# Patient Record
Sex: Female | Born: 1940
Health system: Southern US, Community
[De-identification: ages and names within clinical notes are randomized; demographics above are authoritative.]

## PROBLEM LIST (undated history)

## (undated) DIAGNOSIS — C50919 Malignant neoplasm of unspecified site of unspecified female breast: Secondary | ICD-10-CM

## (undated) DIAGNOSIS — I340 Nonrheumatic mitral (valve) insufficiency: Secondary | ICD-10-CM

## (undated) DIAGNOSIS — E785 Hyperlipidemia, unspecified: Secondary | ICD-10-CM

## (undated) DIAGNOSIS — I493 Ventricular premature depolarization: Secondary | ICD-10-CM

## (undated) DIAGNOSIS — T451X5A Adverse effect of antineoplastic and immunosuppressive drugs, initial encounter: Secondary | ICD-10-CM

## (undated) DIAGNOSIS — Z923 Personal history of irradiation: Secondary | ICD-10-CM

## (undated) DIAGNOSIS — Z9221 Personal history of antineoplastic chemotherapy: Secondary | ICD-10-CM

## (undated) DIAGNOSIS — I4891 Unspecified atrial fibrillation: Secondary | ICD-10-CM

## (undated) DIAGNOSIS — R112 Nausea with vomiting, unspecified: Secondary | ICD-10-CM

## (undated) DIAGNOSIS — E039 Hypothyroidism, unspecified: Secondary | ICD-10-CM

## (undated) DIAGNOSIS — C569 Malignant neoplasm of unspecified ovary: Secondary | ICD-10-CM

## (undated) DIAGNOSIS — K219 Gastro-esophageal reflux disease without esophagitis: Secondary | ICD-10-CM

## (undated) HISTORY — DX: Ventricular premature depolarization: I49.3

## (undated) HISTORY — DX: Hypothyroidism, unspecified: E03.9

## (undated) HISTORY — DX: Gastro-esophageal reflux disease without esophagitis: K21.9

## (undated) HISTORY — PX: ABDOMINAL HYSTERECTOMY: SHX81

## (undated) HISTORY — DX: Adverse effect of antineoplastic and immunosuppressive drugs, initial encounter: R11.2

## (undated) HISTORY — DX: Adverse effect of antineoplastic and immunosuppressive drugs, initial encounter: T45.1X5A

## (undated) HISTORY — PX: BREAST SURGERY: SHX581

## (undated) HISTORY — PX: CHOLECYSTECTOMY: SHX55

## (undated) HISTORY — DX: Hyperlipidemia, unspecified: E78.5

## (undated) HISTORY — DX: Malignant neoplasm of unspecified ovary: C56.9

## (undated) HISTORY — DX: Nonrheumatic mitral (valve) insufficiency: I34.0

## (undated) HISTORY — DX: Malignant neoplasm of unspecified site of unspecified female breast: C50.919

## (undated) HISTORY — DX: Unspecified atrial fibrillation: I48.91

---

## 1990-08-20 DIAGNOSIS — C50919 Malignant neoplasm of unspecified site of unspecified female breast: Secondary | ICD-10-CM

## 1990-08-20 HISTORY — DX: Malignant neoplasm of unspecified site of unspecified female breast: C50.919

## 1996-08-20 DIAGNOSIS — C50919 Malignant neoplasm of unspecified site of unspecified female breast: Secondary | ICD-10-CM

## 1996-08-20 DIAGNOSIS — Z923 Personal history of irradiation: Secondary | ICD-10-CM

## 1996-08-20 HISTORY — DX: Personal history of irradiation: Z92.3

## 1996-08-20 HISTORY — PX: BREAST LUMPECTOMY: SHX2

## 1996-08-20 HISTORY — DX: Malignant neoplasm of unspecified site of unspecified female breast: C50.919

## 1999-02-09 ENCOUNTER — Encounter: Payer: Self-pay | Admitting: Neurosurgery

## 1999-02-09 ENCOUNTER — Ambulatory Visit (HOSPITAL_COMMUNITY): Admission: RE | Admit: 1999-02-09 | Discharge: 1999-02-09 | Payer: Self-pay | Admitting: Neurosurgery

## 2001-05-15 ENCOUNTER — Other Ambulatory Visit: Admission: RE | Admit: 2001-05-15 | Discharge: 2001-05-15 | Payer: Self-pay | Admitting: Family Medicine

## 2004-08-20 HISTORY — PX: COLONOSCOPY: SHX174

## 2004-08-23 ENCOUNTER — Ambulatory Visit: Payer: Self-pay | Admitting: Unknown Physician Specialty

## 2004-09-25 ENCOUNTER — Ambulatory Visit: Payer: Self-pay | Admitting: Unknown Physician Specialty

## 2004-09-25 HISTORY — PX: UPPER GI ENDOSCOPY: SHX6162

## 2004-09-25 LAB — HM COLONOSCOPY

## 2004-10-30 ENCOUNTER — Ambulatory Visit: Payer: Self-pay | Admitting: Surgery

## 2005-02-14 ENCOUNTER — Ambulatory Visit: Payer: Self-pay | Admitting: Family Medicine

## 2006-02-27 ENCOUNTER — Ambulatory Visit: Payer: Self-pay | Admitting: Family Medicine

## 2007-03-28 ENCOUNTER — Ambulatory Visit: Payer: Self-pay | Admitting: Family Medicine

## 2008-03-29 ENCOUNTER — Ambulatory Visit: Payer: Self-pay | Admitting: Family Medicine

## 2009-03-31 ENCOUNTER — Ambulatory Visit: Payer: Self-pay | Admitting: Family Medicine

## 2009-10-04 ENCOUNTER — Emergency Department: Payer: Self-pay | Admitting: Emergency Medicine

## 2009-12-14 LAB — HM DEXA SCAN

## 2010-04-12 ENCOUNTER — Ambulatory Visit: Payer: Self-pay | Admitting: Family Medicine

## 2011-04-26 ENCOUNTER — Ambulatory Visit: Payer: Self-pay | Admitting: Family Medicine

## 2012-05-01 ENCOUNTER — Ambulatory Visit: Payer: Self-pay | Admitting: Family Medicine

## 2013-05-26 ENCOUNTER — Ambulatory Visit: Payer: Self-pay | Admitting: Family Medicine

## 2014-03-16 LAB — LIPID PANEL
Cholesterol: 266 mg/dL — AB (ref 0–200)
HDL: 62 mg/dL (ref 35–70)
LDL CALC: 183 mg/dL
LDL/HDL RATIO: 3
Triglycerides: 103 mg/dL (ref 40–160)

## 2014-03-16 LAB — TSH: TSH: 3.97 u[IU]/mL (ref 0.41–5.90)

## 2014-03-16 LAB — BASIC METABOLIC PANEL
BUN: 14 mg/dL (ref 4–21)
Creatinine: 1.1 mg/dL (ref 0.5–1.1)
GLUCOSE: 84 mg/dL
Sodium: 143 mmol/L (ref 137–147)

## 2014-06-02 ENCOUNTER — Ambulatory Visit: Payer: Self-pay | Admitting: Family Medicine

## 2014-09-02 DIAGNOSIS — I480 Paroxysmal atrial fibrillation: Secondary | ICD-10-CM | POA: Insufficient documentation

## 2014-09-02 DIAGNOSIS — I340 Nonrheumatic mitral (valve) insufficiency: Secondary | ICD-10-CM | POA: Insufficient documentation

## 2014-12-06 ENCOUNTER — Ambulatory Visit
Admit: 2014-12-06 | Disposition: A | Discharge status: Home / Self Care | Payer: Self-pay | Admitting: Unknown Physician Specialty

## 2014-12-08 ENCOUNTER — Ambulatory Visit
Admit: 2014-12-08 | Disposition: A | Discharge status: Home / Self Care | Payer: Self-pay | Attending: Oncology | Admitting: Oncology

## 2014-12-08 LAB — CBC CANCER CENTER
BASOS ABS: 0.1 x10 3/mm (ref 0.0–0.1)
Basophil %: 1.2 %
Eosinophil #: 0.1 x10 3/mm (ref 0.0–0.7)
Eosinophil %: 0.7 %
HCT: 44.4 % (ref 35.0–47.0)
HGB: 14.7 g/dL (ref 12.0–16.0)
LYMPHS ABS: 2.9 x10 3/mm (ref 1.0–3.6)
LYMPHS PCT: 35 %
MCH: 30.5 pg (ref 26.0–34.0)
MCHC: 33.1 g/dL (ref 32.0–36.0)
MCV: 92 fL (ref 80–100)
Monocyte #: 1.4 x10 3/mm — ABNORMAL HIGH (ref 0.2–0.9)
Monocyte %: 17.3 %
NEUTROS PCT: 45.8 %
Neutrophil #: 3.8 x10 3/mm (ref 1.4–6.5)
Platelet: 276 x10 3/mm (ref 150–440)
RBC: 4.82 10*6/uL (ref 3.80–5.20)
RDW: 12.9 % (ref 11.5–14.5)
WBC: 8.2 x10 3/mm (ref 3.6–11.0)

## 2014-12-08 LAB — COMPREHENSIVE METABOLIC PANEL
ALT: 16 U/L
Albumin: 3.8 g/dL
Alkaline Phosphatase: 123 U/L
Anion Gap: 8 (ref 7–16)
BUN: 14 mg/dL
Bilirubin,Total: 0.4 mg/dL
CHLORIDE: 104 mmol/L
CO2: 27 mmol/L
Calcium, Total: 9.3 mg/dL
Creatinine: 0.96 mg/dL
EGFR (African American): >60
GFR CALC NON AF AMER: 60 — AB
Glucose: 98 mg/dL
Potassium: 4.3 mmol/L
SGOT(AST): 20 U/L
Sodium: 139 mmol/L
TOTAL PROTEIN: 7.6 g/dL

## 2014-12-09 ENCOUNTER — Encounter: Payer: Self-pay | Admitting: *Deleted

## 2014-12-09 LAB — CA 125: CA 125: 2354 U/mL — AB (ref 0.0–34.0)

## 2014-12-14 ENCOUNTER — Ambulatory Visit
Admit: 2014-12-14 | Disposition: A | Discharge status: Home / Self Care | Payer: Self-pay | Attending: Obstetrics and Gynecology | Admitting: Obstetrics and Gynecology

## 2014-12-14 ENCOUNTER — Ambulatory Visit: Payer: Self-pay

## 2014-12-14 DIAGNOSIS — C569 Malignant neoplasm of unspecified ovary: Secondary | ICD-10-CM | POA: Diagnosis not present

## 2014-12-14 LAB — CBC WITH DIFFERENTIAL/PLATELET
Basophil #: 0.1 10*3/uL (ref 0.0–0.1)
Basophil %: 0.6 %
EOS PCT: 0.8 %
Eosinophil #: 0.1 10*3/uL (ref 0.0–0.7)
HCT: 45.1 % (ref 35.0–47.0)
HGB: 14.7 g/dL (ref 12.0–16.0)
LYMPHS ABS: 3.2 10*3/uL (ref 1.0–3.6)
Lymphocyte %: 27.8 %
MCH: 30.2 pg (ref 26.0–34.0)
MCHC: 32.6 g/dL (ref 32.0–36.0)
MCV: 93 fL (ref 80–100)
MONO ABS: 1.4 x10 3/mm — AB (ref 0.2–0.9)
Monocyte %: 12.2 %
NEUTROS ABS: 6.7 10*3/uL — AB (ref 1.4–6.5)
Neutrophil %: 58.6 %
PLATELETS: 284 10*3/uL (ref 150–440)
RBC: 4.86 10*6/uL (ref 3.80–5.20)
RDW: 13 % (ref 11.5–14.5)
WBC: 11.4 10*3/uL — ABNORMAL HIGH (ref 3.6–11.0)

## 2014-12-14 LAB — URINALYSIS, COMPLETE
Bilirubin,UR: NEGATIVE
Blood: NEGATIVE
Glucose,UR: NEGATIVE mg/dL (ref 0–75)
Ketone: NEGATIVE
NITRITE: NEGATIVE
Ph: 6 (ref 4.5–8.0)
Protein: NEGATIVE
Specific Gravity: 1.005 (ref 1.003–1.030)

## 2014-12-14 LAB — COMPREHENSIVE METABOLIC PANEL
ALK PHOS: 138 U/L — AB
ALT: 11 U/L — AB
Albumin: 3.8 g/dL
Anion Gap: 8 (ref 7–16)
BUN: 12 mg/dL
Bilirubin,Total: 0.5 mg/dL
CALCIUM: 9.2 mg/dL
Chloride: 106 mmol/L
Co2: 26 mmol/L
Creatinine: 0.97 mg/dL
EGFR (African American): >60
GFR CALC NON AF AMER: 59 — AB
Glucose: 87 mg/dL
POTASSIUM: 4.4 mmol/L
SGOT(AST): 20 U/L
Sodium: 140 mmol/L
Total Protein: 7.3 g/dL

## 2014-12-14 LAB — PROTIME-INR
INR: 1
PROTHROMBIN TIME: 13.8 s

## 2014-12-14 LAB — APTT: ACTIVATED PTT: 29.2 s (ref 23.6–35.9)

## 2014-12-15 ENCOUNTER — Telehealth: Payer: Self-pay | Admitting: General Surgery

## 2014-12-15 ENCOUNTER — Ambulatory Visit (INDEPENDENT_AMBULATORY_CARE_PROVIDER_SITE_OTHER): Payer: PPO | Admitting: General Surgery

## 2014-12-15 ENCOUNTER — Encounter: Payer: Self-pay | Admitting: General Surgery

## 2014-12-15 VITALS — BP 140/72 | HR 70 | Resp 14 | Ht 63.0 in | Wt 182.0 lb

## 2014-12-15 DIAGNOSIS — C569 Malignant neoplasm of unspecified ovary: Secondary | ICD-10-CM

## 2014-12-15 MED ORDER — NEOMYCIN SULFATE 500 MG PO TABS: ORAL_TABLET | ORAL | Status: DC

## 2014-12-15 MED ORDER — POLYETHYLENE GLYCOL 3350 17 GM/SCOOP PO POWD: ORAL | Status: DC

## 2014-12-15 MED ORDER — METRONIDAZOLE 500 MG PO TABS
ORAL_TABLET | ORAL | Status: AC
Start: 2014-12-15 — End: 2014-12-15

## 2014-12-15 NOTE — Patient Instructions (Signed)
Patient to have the following labs drawn at [facility] today: Protein C.     Patient's surgery has been scheduled for 12-22-14 with Dr. Theora Gianotti at the [facility]. This patient has been asked to complete a bowel prep with antibiotics day before surgery.

## 2014-12-15 NOTE — Progress Notes (Signed)
Patient ID: Jamie Conner, female   DOB: Jan 24, 1941, 74 y.o.   MRN: [id number]  Chief Complaint  Patient presents with  . Other    HPI Jamie Conner is a 74 y.o. female here today to discuss exploratory laparoscopic surgery scheduled on 12/22/14. She states she has been having abdominal pain for three weeks in her lower abdomen . The pain is a dull / sharp pain and last for about 1 to 2 hours.  In the preceding months she had been aware of abdominal bloating but no significant weight loss. She was evaluated by Dawson Bills, NP at [facility] for her pain, and subsequent CT suggested ovarian cancer. She has recently seen GYN oncology, Mellody Drown, M.D. and is felt to be a candidate for exploratory celiotomy, debulking with the possibility of colon resection if an direct invasion is appreciated. She is seen now to review these plans from the general surgery side.  Speaking with her medical oncologist, he is desirous of a central venous access port being placed at the time of surgery. HPI  Past Medical History  Diagnosis Date  . Atrial fibrillation   . Hyperlipidemia   . PVC (premature ventricular contraction)   . Mitral insufficiency   . GERD (gastroesophageal reflux disease)   . Cancer of breast     Left  . Hypothyroidism     Past Surgical History  Procedure Laterality Date  . Cesarean section      x2  . Abdominal hysterectomy    . Cholecystectomy    . Colonoscopy  2006  . Breast surgery      Left    No family history on file.  Social History History  Substance Use Topics  . Smoking status: Never Smoker   . Smokeless tobacco: Not on file  . Alcohol Use: No    Allergies  Allergen Reactions  . Carafate [Sucralfate] Rash  . Sulfa Antibiotics Rash    Current Outpatient Prescriptions  Medication Sig Dispense Refill  . acetaminophen (TYLENOL) 500 MG chewable tablet Chew 500 mg by mouth every 6 (six) hours as needed for pain.    Marland Kitchen aspirin EC 81 MG tablet Take 81 mg by  mouth daily.    . diazepam (VALIUM) 5 MG tablet Take 5 mg by mouth at bedtime as needed for anxiety.    Marland Kitchen diltiazem (CARDIZEM CD) 120 MG 24 hr capsule Take 120 mg by mouth daily.    Marland Kitchen docusate sodium (COLACE) 50 MG capsule Take 50 mg by mouth 2 (two) times daily.    Marland Kitchen levothyroxine (SYNTHROID, LEVOTHROID) 50 MCG tablet Take 50 mcg by mouth daily before breakfast.    . pantoprazole (PROTONIX) 40 MG tablet Take 40 mg by mouth daily.    . propafenone (RYTHMOL) 225 MG tablet Take 225 mg by mouth 2 (two) times daily.    Marland Kitchen Propylene Glycol (SYSTANE BALANCE OP) Apply 1 drop to eye 2 (two) times daily as needed.    . neomycin (MYCIFRADIN) 500 MG tablet Take two (2) tablets at 6 pm and two (2) tablets at 11 pm the evening prior to surgery. 4 tablet 0  . polyethylene glycol powder (GLYCOLAX/MIRALAX) powder 255 grams one bottle for bowel prep 255 g 0   No current facility-administered medications for this visit.    Review of Systems Review of Systems  Constitutional: Negative.   Respiratory: Negative.   Cardiovascular: Negative.   Gastrointestinal: Positive for nausea, abdominal pain and constipation. Negative for vomiting, diarrhea, blood in  stool, abdominal distention, anal bleeding and rectal pain.    Blood pressure 140/72, pulse 70, resp. rate 14, height 5\' 3"  (1.6 m), weight 182 lb (82.555 kg).  Physical Exam Physical Exam  Constitutional: She is oriented to person, place, and time. She appears well-developed and well-nourished.  Eyes: Conjunctivae are normal. No scleral icterus.  Neck: Neck supple.  Cardiovascular: Normal rate and normal heart sounds.  An irregular rhythm present.  Pulmonary/Chest: Effort normal and breath sounds normal.  Abdominal: Soft. Normal appearance and bowel sounds are normal. There is no tenderness.    Fullness in the right lower quadrant  Lymphadenopathy:    She has no cervical adenopathy.  Neurological: She is alert and oriented to person, place, and time.   Skin: Skin is warm.    Data Reviewed GYN oncology notes of 12/08/2014 reviewed. Special notation is made of request for extended Lovenox therapy for 28 days post procedure.  12/08/2014 CA-125: Markedly elevated at 2354.  CBC and comprehensive metabolic panel dated 59/16/3846 showed a hemoglobin of 14.7 with an MCV of 93, white blood cell count 11,400 with normal differential. Normal PT, PTT. BUN 12, creatinine 0.97. Estimated GFR 59. Normal electrolytes. Mild elevation of the alkaline phosphatase at 138, SGPT low at 11, SG OT normal at 20. Albumin 3.8.  GYN oncology consent form reviewed.  CT of the abdomen and pelvis dated 12/06/2014 was reviewed. Or centimeter right hepatic mass, previous hemangioma in 2006. No retroperitoneal adenopathy. Complex cystic mass involving the right ovary measuring up to 13 cm in diameter. Multiple peritoneal implants. Peritoneal implant under the right abdominal wall. Evidence of tumor deposit just near the vaginal cuff. Ascites overlying the right lobe of the liver.  Assessment    Advanced ovarian cancer.    Plan    The indication for possible colon resection was reviewed. Possibility of temporary/permanent colostomy discussed.  The patient reported that 2 siblings have protein C deficiency, her sister is had multiple blood clots. She has not had any difficulty with clotting during multiple previous surgeries including C-section, cholecystectomy and left breast cancer surgery. We'll go ahead and check her protein C level prior to surgery.  The indication for PowerPort placement was reviewed with the patient by phone the evening of her office visit. Risks including injury to adjacent organs was discussed. Need for port for adjuvant chemotherapy was emphasized.  Patient to have the following labs drawn at [facility] today: Protein C.     Patient's surgery has been scheduled for 12-22-14 with Dr. Theora Gianotti at the [facility]. This patient has  been asked to complete a bowel prep with antibiotics day before surgery.    PCP:  Cranford Mon, Donnal Moat, Forest Gleason 12/16/2014, 12:04 PM

## 2014-12-15 NOTE — Telephone Encounter (Signed)
After the patient left I reviewed the plan of care with Blain Pais, M.D. for medical oncology. He requested a PowerPort be place of the time surgery.  The patient was contacted by phone and the procedure reviewed. The fact that most people can tolerate her chemotherapy without central venous access was emphasized. The small but real risk to adjacent organs was discussed.  This time we'll plan to place a PowerPort after her exploratory surgery was completed.

## 2014-12-16 ENCOUNTER — Other Ambulatory Visit: Payer: Self-pay | Admitting: General Surgery

## 2014-12-16 ENCOUNTER — Telehealth: Payer: Self-pay | Admitting: *Deleted

## 2014-12-16 ENCOUNTER — Encounter: Payer: Self-pay | Admitting: General Surgery

## 2014-12-16 DIAGNOSIS — C569 Malignant neoplasm of unspecified ovary: Secondary | ICD-10-CM | POA: Insufficient documentation

## 2014-12-16 NOTE — Telephone Encounter (Signed)
-----   Message from Robert Bellow, MD sent at 12/16/2014 11:53 AM EDT ----- Please notify the operating room that I'll be putting a right-sided PowerPort in the day of the exploratory surgery, Dec 22, 2014. Thank you

## 2014-12-16 NOTE — Telephone Encounter (Signed)
Leah in O.R. notified by phone.

## 2014-12-19 HISTORY — PX: BILATERAL SALPINGOOPHORECTOMY: SHX1223

## 2014-12-19 LAB — PROTEIN C DEFICIENCY PROFILE
Protein C Activity: 131 % (ref 74–151)
Protein C Antigen: 100 % (ref 70–140)

## 2014-12-21 LAB — ABO/RH: ABO/RH(D): O POS

## 2014-12-22 ENCOUNTER — Encounter
Admission: RE | Disposition: A | Discharge status: Home / Self Care | Payer: Self-pay | Source: Ambulatory Visit | Attending: General Surgery

## 2014-12-22 ENCOUNTER — Inpatient Hospital Stay
Admission: RE | Admit: 2014-12-22 | Discharge: 2014-12-26 | DRG: 737 | Disposition: A | Discharge status: Home / Self Care | Payer: PPO | Source: Ambulatory Visit | Attending: General Surgery | Admitting: General Surgery

## 2014-12-22 ENCOUNTER — Inpatient Hospital Stay: Payer: PPO | Admitting: *Deleted

## 2014-12-22 ENCOUNTER — Encounter: Payer: Self-pay | Admitting: *Deleted

## 2014-12-22 ENCOUNTER — Inpatient Hospital Stay: Payer: PPO

## 2014-12-22 DIAGNOSIS — R188 Other ascites: Secondary | ICD-10-CM | POA: Diagnosis present

## 2014-12-22 DIAGNOSIS — C569 Malignant neoplasm of unspecified ovary: Secondary | ICD-10-CM | POA: Diagnosis present

## 2014-12-22 DIAGNOSIS — Z90722 Acquired absence of ovaries, bilateral: Secondary | ICD-10-CM

## 2014-12-22 DIAGNOSIS — Z7982 Long term (current) use of aspirin: Secondary | ICD-10-CM | POA: Diagnosis not present

## 2014-12-22 DIAGNOSIS — K219 Gastro-esophageal reflux disease without esophagitis: Secondary | ICD-10-CM | POA: Diagnosis present

## 2014-12-22 DIAGNOSIS — I4891 Unspecified atrial fibrillation: Secondary | ICD-10-CM | POA: Diagnosis present

## 2014-12-22 DIAGNOSIS — Z882 Allergy status to sulfonamides status: Secondary | ICD-10-CM

## 2014-12-22 DIAGNOSIS — C786 Secondary malignant neoplasm of retroperitoneum and peritoneum: Secondary | ICD-10-CM

## 2014-12-22 DIAGNOSIS — Z853 Personal history of malignant neoplasm of breast: Secondary | ICD-10-CM | POA: Diagnosis not present

## 2014-12-22 DIAGNOSIS — E039 Hypothyroidism, unspecified: Secondary | ICD-10-CM | POA: Diagnosis present

## 2014-12-22 DIAGNOSIS — C561 Malignant neoplasm of right ovary: Secondary | ICD-10-CM

## 2014-12-22 DIAGNOSIS — N135 Crossing vessel and stricture of ureter without hydronephrosis: Secondary | ICD-10-CM

## 2014-12-22 DIAGNOSIS — I340 Nonrheumatic mitral (valve) insufficiency: Secondary | ICD-10-CM | POA: Diagnosis present

## 2014-12-22 DIAGNOSIS — Z95828 Presence of other vascular implants and grafts: Secondary | ICD-10-CM

## 2014-12-22 DIAGNOSIS — Z79899 Other long term (current) drug therapy: Secondary | ICD-10-CM | POA: Diagnosis not present

## 2014-12-22 HISTORY — PX: DEBULKING: SHX6277

## 2014-12-22 HISTORY — PX: LAPAROTOMY: SHX154

## 2014-12-22 HISTORY — PX: SALPINGOOPHORECTOMY: SHX82

## 2014-12-22 HISTORY — PX: LAPAROTOMY WITH STAGING: SHX5938

## 2014-12-22 HISTORY — PX: PORTACATH PLACEMENT: SHX2246

## 2014-12-22 HISTORY — PX: PERIPHERAL VASCULAR CATHETERIZATION: SHX172C

## 2014-12-22 LAB — PREPARE RBC (CROSSMATCH)

## 2014-12-22 SURGERY — LAPAROTOMY, EXPLORATORY, FOR STAGING
Anesthesia: General | Site: Chest | Laterality: Right

## 2014-12-22 MED ORDER — PROPAFENONE HCL 225 MG PO TABS
225.0000 mg | ORAL_TABLET | Freq: Two times a day (BID) | ORAL | Status: DC
  Administered 2014-12-22 – 2014-12-26 (×8): 225 mg via ORAL
  Filled 2014-12-22 (×10): qty 1

## 2014-12-22 MED ORDER — SODIUM CHLORIDE 0.9 % IJ SOLN
INTRAMUSCULAR | Status: DC | PRN
  Administered 2014-12-22: 15 mL via INTRAVENOUS

## 2014-12-22 MED ORDER — MORPHINE SULFATE (PF) 1 MG/ML IV SOLN
INTRAVENOUS | Status: DC
  Administered 2014-12-22: 20:00:00 via INTRAVENOUS
  Administered 2014-12-23: 1 mg via INTRAVENOUS
  Administered 2014-12-23 (×2): 1.5 mg via INTRAVENOUS
  Administered 2014-12-24: 0 mg via INTRAVENOUS
  Administered 2014-12-24: 2 mg via INTRAVENOUS
  Administered 2014-12-24: 3 mg via INTRAVENOUS
  Filled 2014-12-22: qty 25

## 2014-12-22 MED ORDER — LACTATED RINGERS IV SOLN
INTRAVENOUS | Status: DC
  Administered 2014-12-23 (×2): via INTRAVENOUS

## 2014-12-22 MED ORDER — PROPOFOL 10 MG/ML IV BOLUS
INTRAVENOUS | Status: DC | PRN
  Administered 2014-12-22: 170 mg via INTRAVENOUS

## 2014-12-22 MED ORDER — GLYCOPYRROLATE 0.2 MG/ML IJ SOLN
INTRAMUSCULAR | Status: DC | PRN
  Administered 2014-12-22: 0.2 mg via INTRAVENOUS
  Administered 2014-12-22: .8 mg via INTRAVENOUS

## 2014-12-22 MED ORDER — ROCURONIUM BROMIDE 100 MG/10ML IV SOLN
INTRAVENOUS | Status: DC | PRN
  Administered 2014-12-22: 20 mg via INTRAVENOUS
  Administered 2014-12-22: 30 mg via INTRAVENOUS

## 2014-12-22 MED ORDER — HYDROMORPHONE HCL 1 MG/ML IJ SOLN
INTRAMUSCULAR | Status: AC
  Filled 2014-12-22: qty 1

## 2014-12-22 MED ORDER — CEFAZOLIN SODIUM 1-5 GM-% IV SOLN
INTRAVENOUS | Status: AC
  Administered 2014-12-22 (×2): 1 g via INTRAVENOUS
  Filled 2014-12-22: qty 50

## 2014-12-22 MED ORDER — LACTATED RINGERS IV SOLN
INTRAVENOUS | Status: DC
Start: 2014-12-22 — End: 2014-12-23

## 2014-12-22 MED ORDER — LACTATED RINGERS IV SOLN
INTRAVENOUS | Status: DC
  Administered 2014-12-22: 12:00:00 via INTRAVENOUS

## 2014-12-22 MED ORDER — SODIUM CHLORIDE 0.9 % IJ SOLN
3.0000 mL | Freq: Two times a day (BID) | INTRAMUSCULAR | Status: DC
  Administered 2014-12-22 – 2014-12-26 (×8): 3 mL via INTRAVENOUS

## 2014-12-22 MED ORDER — PROMETHAZINE HCL 25 MG PO TABS: 12.5000 mg | ORAL_TABLET | Freq: Four times a day (QID) | ORAL | Status: DC | PRN

## 2014-12-22 MED ORDER — MIDAZOLAM HCL 2 MG/2ML IJ SOLN
INTRAMUSCULAR | Status: DC | PRN
  Administered 2014-12-22: 2 mg via INTRAVENOUS

## 2014-12-22 MED ORDER — DILTIAZEM HCL ER COATED BEADS 120 MG PO CP24: 120.0000 mg | ORAL_CAPSULE | Freq: Every day | ORAL | Status: DC

## 2014-12-22 MED ORDER — 0.9 % SODIUM CHLORIDE (POUR BTL) OPTIME
TOPICAL | Status: DC | PRN
  Administered 2014-12-22: 1000 mL
  Administered 2014-12-22: 2000 mL

## 2014-12-22 MED ORDER — SUCCINYLCHOLINE CHLORIDE 20 MG/ML IJ SOLN
INTRAMUSCULAR | Status: DC | PRN
  Administered 2014-12-22: 120 mg via INTRAVENOUS

## 2014-12-22 MED ORDER — SODIUM CHLORIDE 0.9 % IV SOLN
INTRAVENOUS | Status: DC | PRN
  Administered 2014-12-22 (×2): via INTRAVENOUS

## 2014-12-22 MED ORDER — LIDOCAINE HCL (PF) 1 % IJ SOLN
INTRAMUSCULAR | Status: AC
  Filled 2014-12-22: qty 30

## 2014-12-22 MED ORDER — FAMOTIDINE 20 MG PO TABS
ORAL_TABLET | ORAL | Status: AC
  Administered 2014-12-22: 12:00:00 via ORAL
  Filled 2014-12-22: qty 1

## 2014-12-22 MED ORDER — DIAZEPAM 5 MG PO TABS: 5.0000 mg | ORAL_TABLET | Freq: Every evening | ORAL | Status: DC | PRN

## 2014-12-22 MED ORDER — SCOPOLAMINE 1 MG/3DAYS TD PT72
MEDICATED_PATCH | TRANSDERMAL | Status: AC
  Administered 2014-12-22: 13:00:00
  Filled 2014-12-22: qty 1

## 2014-12-22 MED ORDER — ONDANSETRON HCL 4 MG/2ML IJ SOLN
INTRAMUSCULAR | Status: DC | PRN
  Administered 2014-12-22: 4 mg via INTRAVENOUS

## 2014-12-22 MED ORDER — ACETAMINOPHEN 10 MG/ML IV SOLN
1000.0000 mg | Freq: Four times a day (QID) | INTRAVENOUS | Status: AC
  Administered 2014-12-22 – 2014-12-23 (×4): 1000 mg via INTRAVENOUS
  Filled 2014-12-22 (×5): qty 100

## 2014-12-22 MED ORDER — LIDOCAINE HCL 1 % IJ SOLN
INTRAMUSCULAR | Status: DC | PRN
  Administered 2014-12-22: 10 mL

## 2014-12-22 MED ORDER — PROMETHAZINE HCL 25 MG/ML IJ SOLN: 12.5000 mg | INTRAMUSCULAR | Status: DC | PRN

## 2014-12-22 MED ORDER — PANTOPRAZOLE SODIUM 40 MG PO TBEC
40.0000 mg | DELAYED_RELEASE_TABLET | Freq: Every day | ORAL | Status: DC
  Administered 2014-12-23 – 2014-12-26 (×4): 40 mg via ORAL
  Filled 2014-12-22 (×4): qty 1

## 2014-12-22 MED ORDER — ENOXAPARIN SODIUM 40 MG/0.4ML ~~LOC~~ SOLN
40.0000 mg | SUBCUTANEOUS | Status: DC
  Administered 2014-12-23 – 2014-12-26 (×4): 40 mg via SUBCUTANEOUS
  Filled 2014-12-22 (×6): qty 0.4

## 2014-12-22 MED ORDER — NALOXONE HCL 0.4 MG/ML IJ SOLN
0.4000 mg | INTRAMUSCULAR | Status: DC | PRN
  Filled 2014-12-22: qty 1

## 2014-12-22 MED ORDER — KETOROLAC TROMETHAMINE 15 MG/ML IJ SOLN
INTRAMUSCULAR | Status: DC | PRN
  Administered 2014-12-22: 15 mg via INTRAVENOUS

## 2014-12-22 MED ORDER — NEOSTIGMINE METHYLSULFATE 10 MG/10ML IV SOLN
INTRAVENOUS | Status: DC | PRN
  Administered 2014-12-22: 4 mg via INTRAVENOUS

## 2014-12-22 MED ORDER — SODIUM CHLORIDE 0.9 % IJ SOLN
INTRAMUSCULAR | Status: AC
  Administered 2014-12-23: 3 mL via INTRAVENOUS
  Filled 2014-12-22: qty 50

## 2014-12-22 MED ORDER — LIDOCAINE HCL (CARDIAC) 20 MG/ML IV SOLN
INTRAVENOUS | Status: DC | PRN
  Administered 2014-12-22: 100 mg via INTRAVENOUS

## 2014-12-22 MED ORDER — DEXAMETHASONE SODIUM PHOSPHATE 4 MG/ML IJ SOLN
INTRAMUSCULAR | Status: DC | PRN
Start: 2014-12-22 — End: 2014-12-22
  Administered 2014-12-22: 10 mg via INTRAVENOUS

## 2014-12-22 MED ORDER — FENTANYL CITRATE (PF) 250 MCG/5ML IJ SOLN
INTRAMUSCULAR | Status: DC | PRN
  Administered 2014-12-22 (×3): 50 ug via INTRAVENOUS
  Administered 2014-12-22: 100 ug via INTRAVENOUS

## 2014-12-22 MED ORDER — METHYLENE BLUE 1 % INJ SOLN
INTRAMUSCULAR | Status: AC
Start: 2014-12-22 — End: 2014-12-22
  Administered 2014-12-22: 50 mg via INTRAVENOUS
  Filled 2014-12-22: qty 10

## 2014-12-22 MED ORDER — HYDROMORPHONE HCL 1 MG/ML IJ SOLN
0.2500 mg | INTRAMUSCULAR | Status: DC | PRN
  Administered 2014-12-22 (×4): 0.25 mg via INTRAVENOUS

## 2014-12-22 MED ORDER — LEVOTHYROXINE SODIUM 50 MCG PO TABS
50.0000 ug | ORAL_TABLET | Freq: Every day | ORAL | Status: DC
  Administered 2014-12-23 – 2014-12-26 (×4): 50 ug via ORAL
  Filled 2014-12-22 (×4): qty 2
  Filled 2014-12-22: qty 1

## 2014-12-22 MED ORDER — PROMETHAZINE HCL 25 MG/ML IJ SOLN: 6.2500 mg | INTRAMUSCULAR | Status: DC | PRN

## 2014-12-22 MED ORDER — ASPIRIN EC 81 MG PO TBEC
81.0000 mg | DELAYED_RELEASE_TABLET | Freq: Every day | ORAL | Status: DC
  Administered 2014-12-23 – 2014-12-26 (×4): 81 mg via ORAL
  Filled 2014-12-22 (×4): qty 1

## 2014-12-22 MED ORDER — ACETAMINOPHEN 10 MG/ML IV SOLN
INTRAVENOUS | Status: AC
  Administered 2014-12-22: 1000 mg via INTRAVENOUS
  Filled 2014-12-22: qty 100

## 2014-12-22 MED ORDER — LACTATED RINGERS IV SOLN
INTRAVENOUS | Status: DC | PRN
  Administered 2014-12-22: 14:00:00 via INTRAVENOUS

## 2014-12-22 MED ORDER — SODIUM CHLORIDE 0.9 % IJ SOLN: 9.0000 mL | INTRAMUSCULAR | Status: DC | PRN

## 2014-12-22 MED ORDER — CEFAZOLIN SODIUM 1-5 GM-% IV SOLN: 1.0000 g | INTRAVENOUS | Status: DC

## 2014-12-22 SURGICAL SUPPLY — 112 items
APL SKNCLS STERI-STRIP NONHPOA (GAUZE/BANDAGES/DRESSINGS) ×2
APPLIER CLIP ROT 10 11.4 M/L (STAPLE) ×6
APR CLP MED LRG 11.4X10 (STAPLE) ×4
BAG COUNTER SPONGE EZ (MISCELLANEOUS) ×5 IMPLANT
BAG SPNG 4X4 CLR HAZ (MISCELLANEOUS) ×4
BENZOIN TINCTURE PRP APPL 2/3 (GAUZE/BANDAGES/DRESSINGS) ×6 IMPLANT
BLADE SURG 15 STRL LF DISP TIS (BLADE) ×4 IMPLANT
BLADE SURG 15 STRL SS (BLADE) ×5
BLADE SURG 15 STRL SS SAFETY (BLADE) ×6 IMPLANT
BULB RESERV EVAC DRAIN JP 100C (MISCELLANEOUS) ×6 IMPLANT
CANISTER SUCT 1200ML W/VALVE (MISCELLANEOUS) ×12 IMPLANT
CATH FOLEY 2WAY  5CC 16FR (CATHETERS) ×2
CATH FOLEY 2WAY 5CC 16FR (CATHETERS) ×3
CATH TRAY 16F METER LATEX (MISCELLANEOUS) ×12 IMPLANT
CATH URTH 16FR FL 2W BLN LF (CATHETERS) ×4 IMPLANT
CHLORAPREP W/TINT 26ML (MISCELLANEOUS) ×12 IMPLANT
CLIP APPLIE ROT 10 11.4 M/L (STAPLE) ×4 IMPLANT
CLIP TI MEDIUM 6 (CLIP) ×6 IMPLANT
CLOSURE WOUND 1/2 X4 (GAUZE/BANDAGES/DRESSINGS) ×3
CNTNR SPEC 2.5X3XGRAD LEK (MISCELLANEOUS) ×4
CONT SPEC 4OZ STER OR WHT (MISCELLANEOUS) ×2
CONT SPEC 4OZ STRL OR WHT (MISCELLANEOUS) ×4
CONTAINER SPEC 2.5X3XGRAD LEK (MISCELLANEOUS) ×4 IMPLANT
COUNTER SPONGE BAG EZ (MISCELLANEOUS) ×1
COVER CLAMP SIL LG PBX B (MISCELLANEOUS) ×6 IMPLANT
COVER LIGHT HANDLE STERIS (MISCELLANEOUS) ×24 IMPLANT
DRAIN CHANNEL JP 15F RND 16 (MISCELLANEOUS) ×6 IMPLANT
DRAPE C-ARM XRAY 36X54 (DRAPES) ×6 IMPLANT
DRAPE LAPAROTOMY 100X77 ABD (DRAPES) ×12 IMPLANT
DRAPE LAPAROTOMY TRNSV 106X77 (MISCELLANEOUS) ×6 IMPLANT
DRAPE PED LAPAROTOMY (DRAPES) ×6 IMPLANT
DRAPE UNDER BUTTOCK W/FLU (DRAPES) ×6 IMPLANT
DRESSING SURGICEL FIBRLLR 1X2 (HEMOSTASIS) ×4 IMPLANT
DRESSING TELFA 4X3 1S ST N-ADH (GAUZE/BANDAGES/DRESSINGS) ×6 IMPLANT
DRSG OPSITE POSTOP 4X12 (GAUZE/BANDAGES/DRESSINGS) ×6 IMPLANT
DRSG SURGICEL FIBRILLAR 1X2 (HEMOSTASIS) ×6
DRSG TEGADERM 2-3/8X2-3/4 SM (GAUZE/BANDAGES/DRESSINGS) ×6 IMPLANT
DRSG TEGADERM 4X4.75 (GAUZE/BANDAGES/DRESSINGS) ×6 IMPLANT
DRSG TELFA 3X8 NADH (GAUZE/BANDAGES/DRESSINGS) ×12 IMPLANT
ELECT BLADE 6 FLAT ULTRCLN (ELECTRODE) ×12 IMPLANT
ELECT CAUTERY BLADE 6.4 (BLADE) ×6 IMPLANT
GAUZE SPONGE 4X4 12PLY STRL (GAUZE/BANDAGES/DRESSINGS) ×12 IMPLANT
GLOVE BIO SURGEON STRL SZ 6.5 (GLOVE) ×15 IMPLANT
GLOVE BIO SURGEON STRL SZ7 (GLOVE) ×24 IMPLANT
GLOVE BIO SURGEON STRL SZ7.5 (GLOVE) ×18 IMPLANT
GLOVE BIO SURGEONS STRL SZ 6.5 (GLOVE) ×3
GLOVE INDICATOR 7.0 STRL GRN (GLOVE) ×12 IMPLANT
GLOVE INDICATOR 8.0 STRL GRN (GLOVE) ×18 IMPLANT
GOWN STRL REUS W/ TWL LRG LVL3 (GOWN DISPOSABLE) ×24 IMPLANT
GOWN STRL REUS W/TWL LRG LVL3 (GOWN DISPOSABLE) ×36
HARMONIC SCALPEL FOCUS (MISCELLANEOUS) ×6 IMPLANT
KIT RM TURNOVER CYSTO AR (KITS) ×6 IMPLANT
KIT RM TURNOVER STRD PROC AR (KITS) ×12 IMPLANT
LABEL OR SOLS (LABEL) ×12 IMPLANT
LIGASURE IMPACT 36 18CM CVD LR (INSTRUMENTS) ×6 IMPLANT
NDL INSUFF ACCESS 14 VERSASTEP (NEEDLE) ×6 IMPLANT
NDL SAFETY 25GX1.5 (NEEDLE) ×6 IMPLANT
NS IRRIG 1000ML POUR BTL (IV SOLUTION) ×24 IMPLANT
NS IRRIG 500ML POUR BTL (IV SOLUTION) ×12 IMPLANT
PACK BASIN MAJOR ARMC (MISCELLANEOUS) ×12 IMPLANT
PACK BASIN MINOR ARMC (MISCELLANEOUS) ×12 IMPLANT
PACK LAP CHOLECYSTECTOMY (MISCELLANEOUS) ×6 IMPLANT
PACK PORT-A-CATH (MISCELLANEOUS) ×6 IMPLANT
PAD GROUND ADULT SPLIT (MISCELLANEOUS) ×12 IMPLANT
PAD OB MATERNITY 4.3X12.25 (PERSONAL CARE ITEMS) ×6 IMPLANT
PENCIL ELECTRO HAND CTR (MISCELLANEOUS) ×6 IMPLANT
PORTACATH POWER 8F (Port) ×6 IMPLANT
PREP PVP WINGED SPONGE (MISCELLANEOUS) ×6 IMPLANT
RETAINER VISCERA MED (MISCELLANEOUS) ×6 IMPLANT
SCALPEL PROTECTED #11 DISP (BLADE) ×6 IMPLANT
SEAL FOR SCOPE WARMER C3101 (MISCELLANEOUS) ×6 IMPLANT
SET YANKAUER POOLE SUCT (MISCELLANEOUS) ×6 IMPLANT
SHEARS HARMONIC ACE PLUS 36CM (ENDOMECHANICALS) IMPLANT
SOFT FLEX PROBE COVER ×6 IMPLANT
SOL PREP PVP 2OZ (MISCELLANEOUS) ×6
SOLUTION PREP PVP 2OZ (MISCELLANEOUS) ×4 IMPLANT
SPONGE LAP 18X18 5 PK (GAUZE/BANDAGES/DRESSINGS) ×24 IMPLANT
STAPLE ECHEON FLEX 60 POW ENDO (STAPLE) ×6 IMPLANT
STAPLER SKIN PROX 35W (STAPLE) ×12 IMPLANT
STRIP CLOSURE SKIN 1/2X4 (GAUZE/BANDAGES/DRESSINGS) ×15 IMPLANT
SUT ETH BLK MONO 3 0 FS 1 12/B (SUTURE) ×6 IMPLANT
SUT ETHILON 5-0 FS-2 18 BLK (SUTURE) ×6 IMPLANT
SUT MAXON ABS #0 GS21 30IN (SUTURE) ×12 IMPLANT
SUT PDS AB 1 TP1 96 (SUTURE) ×18 IMPLANT
SUT PROLENE 0 CT 1 30 (SUTURE) ×24 IMPLANT
SUT PROLENE 3 0 SH DA (SUTURE) ×12 IMPLANT
SUT SILK 3 0 SH 30 (SUTURE) ×6 IMPLANT
SUT SILK 3-0 (SUTURE) ×6 IMPLANT
SUT VIC AB 0 CT1 27 (SUTURE) ×10
SUT VIC AB 0 CT1 27XCR 8 STRN (SUTURE) ×8 IMPLANT
SUT VIC AB 0 CT1 36 (SUTURE) ×6 IMPLANT
SUT VIC AB 2-0 BRD 54 (SUTURE) ×6 IMPLANT
SUT VIC AB 2-0 SH 27 (SUTURE) ×24
SUT VIC AB 2-0 SH 27XBRD (SUTURE) ×16 IMPLANT
SUT VIC AB 3-0 SH 27 (SUTURE) ×20
SUT VIC AB 3-0 SH 27X BRD (SUTURE) ×16 IMPLANT
SUT VIC AB 4-0 FS2 27 (SUTURE) ×12 IMPLANT
SUT VIC AB 4-0 SH 27 (SUTURE) ×5
SUT VIC AB 4-0 SH 27XANBCTRL (SUTURE) ×4 IMPLANT
SUT VICRYL 3-0 SH-1 18IN (SUTURE) ×6 IMPLANT
SUT VICRYL AB 3-0 FS1 BRD 27IN (SUTURE) ×6 IMPLANT
SUT VICRYL+ 3-0 144IN (SUTURE) ×6 IMPLANT
SWABSTK COMLB BENZOIN TINCTURE (MISCELLANEOUS) ×6 IMPLANT
SYR BULB IRRIG 60ML STRL (SYRINGE) ×6 IMPLANT
SYR CONTROL 10ML (SYRINGE) ×12 IMPLANT
SYRINGE 10CC LL (SYRINGE) ×6 IMPLANT
TOWEL OR 17X26 4PK STRL BLUE (TOWEL DISPOSABLE) ×6 IMPLANT
TROCAR XCEL 12X100 BLDLESS (ENDOMECHANICALS) ×6 IMPLANT
TROCAR XCEL NON-BLD 11X100MML (ENDOMECHANICALS) ×6 IMPLANT
TUBING INSUFFLATOR HEATED (MISCELLANEOUS) ×6 IMPLANT
TUBING INSUFFLATOR HI FLOW (MISCELLANEOUS) ×6 IMPLANT
WATER STERILE IRR 1000ML POUR (IV SOLUTION) ×6 IMPLANT

## 2014-12-22 NOTE — OR Nursing (Signed)
Updated family at 15:09

## 2014-12-22 NOTE — H&P (Signed)
Jamie Conner is a 74 y.o. female here today to discuss exploratory laparoscopic surgery scheduled on 12/22/14. She states she has been having abdominal pain for three weeks in her lower abdomen . The pain is a dull / sharp pain and last for about 1 to 2 hours. In the preceding months she had been aware of abdominal bloating but no significant weight loss. She was evaluated by Dawson Bills, NP at [facility] for her pain, and subsequent CT suggested ovarian cancer. She has recently seen GYN oncology, Mellody Drown, M.D. and is felt to be a candidate for exploratory celiotomy, debulking with the possibility of colon resection if an direct invasion is appreciated. She is seen now to review these plans from the general surgery side.  Speaking with her medical oncologist, he is desirous of a central venous access port being placed at the time of surgery. HPI  Past Medical History  Diagnosis Date  . Atrial fibrillation   . Hyperlipidemia   . PVC (premature ventricular contraction)   . Mitral insufficiency   . GERD (gastroesophageal reflux disease)   . Cancer of breast     Left  . Hypothyroidism     Past Surgical History  Procedure Laterality Date  . Cesarean section      x2  . Abdominal hysterectomy    . Cholecystectomy    . Colonoscopy  2006  . Breast surgery      Left    No family history on file.  Social History History  Substance Use Topics  . Smoking status: Never Smoker   . Smokeless tobacco: Not on file  . Alcohol Use: No    Allergies  Allergen Reactions  . Carafate [Sucralfate] Rash  . Sulfa Antibiotics Rash    Current Outpatient Prescriptions  Medication Sig Dispense Refill  . acetaminophen (TYLENOL) 500 MG chewable tablet Chew 500 mg by mouth every 6 (six) hours as needed for pain.    Marland Kitchen aspirin EC 81 MG tablet Take 81 mg by mouth daily.    . diazepam (VALIUM) 5 MG  tablet Take 5 mg by mouth at bedtime as needed for anxiety.    Marland Kitchen diltiazem (CARDIZEM CD) 120 MG 24 hr capsule Take 120 mg by mouth daily.    Marland Kitchen docusate sodium (COLACE) 50 MG capsule Take 50 mg by mouth 2 (two) times daily.    Marland Kitchen levothyroxine (SYNTHROID, LEVOTHROID) 50 MCG tablet Take 50 mcg by mouth daily before breakfast.    . pantoprazole (PROTONIX) 40 MG tablet Take 40 mg by mouth daily.    . propafenone (RYTHMOL) 225 MG tablet Take 225 mg by mouth 2 (two) times daily.    Marland Kitchen Propylene Glycol (SYSTANE BALANCE OP) Apply 1 drop to eye 2 (two) times daily as needed.    . neomycin (MYCIFRADIN) 500 MG tablet Take two (2) tablets at 6 pm and two (2) tablets at 11 pm the evening prior to surgery. 4 tablet 0  . polyethylene glycol powder (GLYCOLAX/MIRALAX) powder 255 grams one bottle for bowel prep 255 g 0   No current facility-administered medications for this visit.    Review of Systems Review of Systems  Constitutional: Negative.  Respiratory: Negative.  Cardiovascular: Negative.  Gastrointestinal: Positive for nausea, abdominal pain and constipation. Negative for vomiting, diarrhea, blood in stool, abdominal distention, anal bleeding and rectal pain.    Blood pressure 140/72, pulse 70, resp. rate 14, height 5\' 3"  (1.6 m), weight 182 lb (82.555 kg).  Physical Exam Physical Exam  Constitutional: She is oriented to person, place, and time. She appears well-developed and well-nourished.  Eyes: Conjunctivae are normal. No scleral icterus.  Neck: Neck supple.  Cardiovascular: Normal rate and normal heart sounds. An irregular rhythm present.  Pulmonary/Chest: Effort normal and breath sounds normal.  Abdominal: Soft. Normal appearance and bowel sounds are normal. There is no tenderness.    Fullness in the right lower quadrant  Lymphadenopathy:   She has no cervical adenopathy.  Neurological: She is alert and oriented to person, place, and  time.  Skin: Skin is warm.    Data Reviewed GYN oncology notes of 12/08/2014 reviewed. Special notation is made of request for extended Lovenox therapy for 28 days post procedure.  12/08/2014 CA-125: Markedly elevated at 2354.  CBC and comprehensive metabolic panel dated 38/18/2993 showed a hemoglobin of 14.7 with an MCV of 93, white blood cell count 11,400 with normal differential. Normal PT, PTT. BUN 12, creatinine 0.97. Estimated GFR 59. Normal electrolytes. Mild elevation of the alkaline phosphatase at 138, SGPT low at 11, SG OT normal at 20. Albumin 3.8.  GYN oncology consent form reviewed.  CT of the abdomen and pelvis dated 12/06/2014 was reviewed. Or centimeter right hepatic mass, previous hemangioma in 2006. No retroperitoneal adenopathy. Complex cystic mass involving the right ovary measuring up to 13 cm in diameter. Multiple peritoneal implants. Peritoneal implant under the right abdominal wall. Evidence of tumor deposit just near the vaginal cuff. Ascites overlying the right lobe of the liver.  Assessment    Advanced ovarian cancer.    Plan    The indication for possible colon resection was reviewed. Possibility of temporary/permanent colostomy discussed.  The patient reported that 2 siblings have protein C deficiency, her sister is had multiple blood clots. She has not had any difficulty with clotting during multiple previous surgeries including C-section, cholecystectomy and left breast cancer surgery. We'll go ahead and check her protein C level prior to surgery.  The indication for PowerPort placement was reviewed with the patient by phone the evening of her office visit. Risks including injury to adjacent organs was discussed. Need for port for adjuvant chemotherapy was emphasized.  Patient to have the following labs drawn at [facility] today: Protein C.     Patient's surgery has been scheduled for 12-22-14 with Dr. Theora Gianotti at the [facility]. This patient  has been asked to complete a bowel prep with antibiotics day before surgery.   PCP: Cranford Mon, Donnal Moat, Forest Gleason 12/16/2014, 12:04 PM     No interval change. Procedures reviewed w/ patient and family.

## 2014-12-22 NOTE — Op Note (Signed)
Preoperative diagnosis: Ovarian cancer, stage III, need for central venous access.  Postoperative diagnosis: Same.  Operating surgeon Ollen Bowl, M.D.  Anesthesia: Gen. endotracheal, Dr. Andree Elk. 1% Xylocaine plain, 10 mL.  Estimated blood loss minimal  Clinical note this 74 year old woman was identified with ovarian mass and underwent a debulking procedure today. Central venous access was requested by her treating oncologist for planned chemotherapy.  Op note the patient was redosed with Ancef 1 g intravenously prior to the procedure. The left chest and neck was prepped with ChloraPrep and draped. Ultrasound was used to confirm patency of the left subclavian vein. This was cannulated under ultrasound guidance. Guidewire was passed followed by the dilator. Fluoroscopy was used to position the catheter tip at [facility] and right atrium. The catheter was tunneled to a pocket on the left chest. The port was anchored to the deep tissue with interrupted 3-0 Prolene sutures 2. The catheter easily irrigated and aspirated with injectable saline. The catheter was flushed with 10 mL of saline prior to closure. The wound was closed in layers with 3-0 Vicryl to the adipose layer running 4-0 Vicryl septic suture for the skin. Benzoin Steri-Strips Telfa and tape dressings were applied.

## 2014-12-22 NOTE — OR Nursing (Signed)
Per Dr. Theora Gianotti updated patient's family of closing of abdomen and about to place port

## 2014-12-22 NOTE — Op Note (Addendum)
Operative Note  12/22/2014 4:42 PM  PRE-OP DIAGNOSIS: Probable ovarian cancer.   POST-OP DIAGNOSIS:Stage IIIC ovarian cancer, optimally debulked to no gross residual, and significant right pelvic peritoneal disease. Right retroperitoneal fibrosis.  SURGEON: Dr. Venida Jarvis Secord  ASSISTANT: Dr. Brunilda Payor  ANESTHESIA: General  PROCEDURE: Exploratory Laparotomy, bilateral salpingo-oophorectomy, right ureterolysis, infragastric omentectomy, and optimally debulked to no gross residual.   Intravenous port placed by Dr. Fleet Contras.    ESTIMATED BLOOD LOSS: 100 cc  DRAINS: Foley  TOTAL IV FLUIDS: 2500 cc  UOP: 400 cc  SPECIMENS:  Bilateral tubes and ovaries; omentum, multiple tumor nodules, and ascites for cytology.   COMPLICATIONS: None   DISPOSITION: Stable   INDICATIONS: Right complex symptomatic adnexal mass  FINDINGS: Exam under anesthesia revealed a 18 cm mobile right adnexal mass. The uterus and cervix are surgically absent. Intraoperative findings revealed a right complex ovarian mass that was adherent to the right side wall and deep pelvis. The disease involved the right pelvic wall peritoneum, including the uterine artery remnant and in close approximation with the ureter. The left ovary and tube were normal except for a para-ovarian cyst measuring 8 cm. There were multiple tumor implants involving the pelvic peritoneal surfaces, the rectosigmoid, small bowel mesentery, small bowel, and omentum - the largest measuring over 2 cm. The upper abdomen including anterior stomach surface, liver, diaphragm, remaining small and large bowel, and spleen appeared normal. The appendix was surgically absent. There was a 1.5 cm mobile para-aortic node superior to the reflection of the duodenum. This node was not grossly involved with tumor.     PROCEDURE IN DETAIL: After informed consent was obtained, the patient was taken to the operating room where general endotracheal  anesthesia was performed without difficulty. The patient was positioned in the dorsal lithotomy position in [place] and the usual precautions taken with her arms in arm boards bilaterally. She was prepped and draped in normal sterile fashion. EUA was performed with the above noted findings. Time-out was performed and a Foley catheter was placed into the bladder.  A vertical midline incision was made, resecting the prior scar, and carried down to the underlying fascia. The fascia was incised in the midline, and the peritoneum was then entered. After the incision was extended the operative findings noted above were identified. Ascites was removed and sent to cytology. The right ovary was lifted through the abdominal wall. The ureter identified and infundibular ligament ligated with the ligasure and transected. The mass was adherent to the pelvic side wall peritoneum and floor and the tumor was transected with the Ligasure. The bowel was freed up and placed up into the upper abdomen, packed away and a Bookwalter abdominal retractor was then placed. The peritoneum lateral to the IP ligaments was  divided on each side with electrocautery and the retroperitoneal space was opened bilaterally. The ureters were identified and preserved. The infundibulopelvic ligaments were skeletonized, divided, then suture ligated. The peritoneum on the right involved with tumor was resected, but involved the right uterine artery remnant and in close proximity to the ureter. Right ureterolysis was performed to move the ureter laterally and the uterine artery remnant was suture ligated to control bleeding. On the left the para-ovarian cyst was removed with the Ligasure. The left IP ligament was isolated and transected with the Ligasure. All remaining attachments were divided and the left ovary and tube removed.   There were multiple tumor implants involving the pelvic peritoneal surfaces, including the rectosigmoid and its  mesentery as well as the bladder peritoneum. These were all resected. The bladder was filled with sterile saline and noted to be intact. An area on the posterior bladder surface was reinforced with 2-0 vicryl placed in an interrupting fashion. Fibrillar was placed on raw surfaces to optimize hemostasis.     At this point attention was paid to the omentum were an infracolic omentectomy was performed using cautery to skeletonise the vessels and the Ligasure to control the vessels. All the pedicles were hemostatic and the omentum was sent to pathology. The multiple tumor implants involving the mesenteric surfaces of the bowel and two involving the small bowel serosa were resected. At one of these sites the tumor nodule directly involved the small bowel serosa. This area was reinforced with a 3-0 vicryl suture. There was no evidence of serotomy or enterotomy.   The patient was given Methylene blue IV and blue urine was noted in the Foley. There was no evidence of blue extravasation from the ureter into the peritoneal cavity. The right ureter was normal caliber and normal peristalsis was observed.   After the abdomen was irrigated and all pedicles felt to be hemostatic the laparotomy packs were removed and the fascia was then reapproximated in a running mass abdominal wall closure using 1 loop Maxon. The subcutaneous space was then irrigated, closed with 2-0 vicryl in a running fashion, and the skin was closed with staples. A sterile dressing was placed in the operating room. The patient tolerated the procedure well. Sponge, lap, needle and instrument counts were correct x2 at the end of the procedure.   Antibiotics: Given 1st generation cephalosporin, Antibiotics given within 1 hour of the start of the procedure and again at closure. Plan to discontinue antibiotics ordered within 24 hours post procedure.  VTE prophylaxis: ordered perioperatively.  Wound: Clean    ATTESTATION:  TP- Surgery  - I  performed the entire gynecologic portion of the procedure with Dr. Brunilda Payor, who provided assistance.  Angeles Gaetana Michaelis, MD

## 2014-12-22 NOTE — Consult Note (Signed)
Please  See H&P by Dr. Fransisca Connors and Dr. Bary Castilla. Patient is ok to proceed with surgery. CA125 elevated. Otherwise labs adequate. Most likely ovarian cancer. She is unsure regarding IP therapy and is not ready to make a decision today. Plan to proceed as consented with XL, BSO, and debulking.  Gillis Ends, MD

## 2014-12-22 NOTE — Transfer of Care (Signed)
Immediate Anesthesia Transfer of Care Note  Patient: Jamie Conner  Procedure(s) Performed: Procedure(s): LAPAROTOMY WITH STAGING (N/A) SALPINGO OOPHORECTOMY (Bilateral) DEBULKING (N/A) EXPLORATORY LAPAROTOMY (N/A) INSERTION PORT-A-CATH (Right) PORTA CATH INSERTION (Left)  Patient Location: PACU  Anesthesia Type:General  Level of Consciousness: awake, alert  and oriented  Airway & Oxygen Therapy: Patient connected to face mask oxygen  Post-op Assessment: Report given to RN  Post vital signs: stable  Last Vitals:  Filed Vitals:   12/22/14 1716  BP: 114/57  Pulse: 61  Temp: 36.4 C  Resp:     Complications: No apparent anesthesia complications

## 2014-12-22 NOTE — Anesthesia Procedure Notes (Signed)
Procedure Name: Intubation Date/Time: 12/22/2014 1:50 PM Performed by: Aline Brochure Pre-anesthesia Checklist: Patient identified, Emergency Drugs available, Suction available and Patient being monitored Patient Re-evaluated:Patient Re-evaluated prior to inductionOxygen Delivery Method: Circle system utilized Preoxygenation: Pre-oxygenation with 100% oxygen Intubation Type: IV induction Ventilation: Mask ventilation without difficulty Laryngoscope Size: Glidescope Grade View: Grade I Tube type: Oral Tube size: 7.0 mm Number of attempts: 2 Airway Equipment and Method: Video-laryngoscopy and Bougie stylet Placement Confirmation: ETT inserted through vocal cords under direct vision,  positive ETCO2 and breath sounds checked- equal and bilateral Secured at: 21 cm Tube secured with: Tape Difficulty Due To: Difficulty was unanticipated and Difficult Airway- due to anterior larynx Comments: Patient's o2 sat never fell below 94%. Mask ventilated between laryngoscopy attempts.

## 2014-12-22 NOTE — OR Nursing (Signed)
This Probation officer assumed pt care at 1315, pt and family awaiting or. 1335 pt to or with staff after all questions answered and or team will start antibiotic,. All belongings and glasses were given to pts daughter

## 2014-12-22 NOTE — Anesthesia Preprocedure Evaluation (Signed)
Anesthesia Evaluation  Patient identified by MRN, date of birth, ID band Patient awake    Reviewed: Allergy & Precautions, NPO status , Patient's Chart, lab work & pertinent test results  History of Anesthesia Complications (+) PONV  Airway Mallampati: I  TM Distance: >3 FB Neck ROM: Full    Dental  (+) Teeth Intact   Pulmonary neg pulmonary ROS,  breath sounds clear to auscultation  Pulmonary exam normal       Cardiovascular Exercise Tolerance: Good Normal cardiovascular examRhythm:Regular Rate:Normal  Moderate MR,occas.  parox. afib.   Neuro/Psych negative neurological ROS  negative psych ROS   GI/Hepatic GERD-  Medicated and Controlled,Nauseated with bowel prep   Endo/Other  Hypothyroidism Treated hypothyroidism  Renal/GU negative Renal ROS     Musculoskeletal   Abdominal   Peds  Hematology negative hematology ROS (+)   Anesthesia Other Findings   Reproductive/Obstetrics                             Anesthesia Physical Anesthesia Plan  ASA: III  Anesthesia Plan: General   Post-op Pain Management:    Induction: Intravenous  Airway Management Planned: Oral ETT  Additional Equipment: Arterial line  Intra-op Plan:   Post-operative Plan: Extubation in OR and Possible Post-op intubation/ventilation  Informed Consent: I have reviewed the patients History and Physical, chart, labs and discussed the procedure including the risks, benefits and alternatives for the proposed anesthesia with the patient or authorized representative who has indicated his/her understanding and acceptance.     Plan Discussed with: CRNA  Anesthesia Plan Comments:         Anesthesia Quick Evaluation

## 2014-12-23 NOTE — Anesthesia Postprocedure Evaluation (Signed)
  Anesthesia Post-op Note  Patient: Jamie Conner  Procedure(s) Performed: Procedure(s): LAPAROTOMY WITH STAGING (N/A) SALPINGO OOPHORECTOMY (Bilateral) DEBULKING (N/A) EXPLORATORY LAPAROTOMY (N/A) INSERTION PORT-A-CATH (Right) PORTA CATH INSERTION (Left)  Anesthesia type:General  Patient location: 160a  Post pain: Pain level controlled  Post assessment: Post-op Vital signs reviewed, Patient's Cardiovascular Status Stable, Respiratory Function Stable, Patent Airway and No signs of Nausea or vomiting  Post vital signs: Reviewed and stable  Last Vitals:  Filed Vitals:   12/23/14 0544  BP: 104/54  Pulse: 49  Temp: 36.5 C  Resp: 18    Level of consciousness: awake, alert  and patient cooperative  Complications: No apparent anesthesia complications

## 2014-12-23 NOTE — Progress Notes (Signed)
AVSS. Comfortable. Good pain relief w/ PCA and IV tylenol. Lungs: Clear. Cardio: RR. ABD: Non-distended, soft. BS+. Some soreness in upper abdominal wall from retractors. Calves: Soft.  Ambulate today.

## 2014-12-23 NOTE — Progress Notes (Signed)
Patient A/O, no noted distress. Surgical sites are WNL. Tolerated meds as well. Foley patent and secured. Staff will continue to monitor and meet needs. Patient does not like the alarm on the PCA pump. Patient may want to discuss to do away with the PCA.

## 2014-12-24 LAB — TYPE AND SCREEN
ABO/RH(D): O POS
ANTIBODY SCREEN: NEGATIVE
Unit division: 0
Unit division: 0

## 2014-12-24 MED ORDER — ENOXAPARIN SODIUM 40 MG/0.4ML ~~LOC~~ SOLN: 40.0000 mg | SUBCUTANEOUS | Status: DC

## 2014-12-24 MED ORDER — ACETAMINOPHEN 325 MG PO TABS
650.0000 mg | ORAL_TABLET | ORAL | Status: DC | PRN
  Administered 2014-12-24 – 2014-12-25 (×3): 650 mg via ORAL
  Filled 2014-12-24 (×3): qty 2

## 2014-12-24 MED ORDER — HYDROCODONE-ACETAMINOPHEN 5-325 MG PO TABS
1.0000 | ORAL_TABLET | ORAL | Status: DC | PRN
Start: 2014-12-24 — End: 2014-12-26
  Administered 2014-12-25: 1 via ORAL
  Filled 2014-12-24 (×2): qty 1

## 2014-12-24 MED ORDER — HYDROCODONE-ACETAMINOPHEN 5-325 MG PO TABS: 2.0000 | ORAL_TABLET | ORAL | Status: DC | PRN

## 2014-12-24 NOTE — Progress Notes (Signed)
Afebrile. Mild bradycardia persists. (Diltiazem held yesterday). BP low normal, but patient is asymptomatic.  Minimal PCA requirements.  Lungs: Clear. Cardio: RR. ABD: Mild distension/ tympany. Soft. Minimal tenderness. Wound: No new drainage. No erythema.  Calves: Soft. Incentive at 1500. No flatus. Tolerated liquids well. IMP: Doing well. Plan: D/C IV/ PCA/ SCD/ TED.  Increase ambulation. Advance diet. Home when passing flatus, adequate po liquids ( 1.5 L) and good pain control.

## 2014-12-24 NOTE — Discharge Instructions (Signed)
May shower at any time.  No driving until pain free.   No lifting over 10 pounds.  Diet as tolerated.

## 2014-12-24 NOTE — Progress Notes (Signed)
Pt ambulated around unit with stand by assist. IVF's infusing without difficulty. PCA managing pain well at this time. PCA pump alarms often due to pt's hr at 47-49, Pt asymptomatic and states " I feel fine". Will continue to monitor. Foley to gravity, peri care given. Pt with no c/o a this time. Abdominal surgical honeycomb dressing  Intact, no new drainage noted.  L Chest port dressing noted  C/D/I.  Call light and ascom phone in reach, monitoring continued per M.D. Orders and unit protocol.

## 2014-12-24 NOTE — Consult Note (Signed)
[facility] Consultation Note  Patient ID: Jamie Conner, MRN: [id number], DOB/AGE: [birth year] 74 y.o. Admit date: 12/22/2014   Date of Consult: 12/24/2014 Primary Physician: Miguel Aschoff, MD Primary Cardiologist: Nehemiah Massed  Chief Complaint: No chief complaint on file.  patient had surgery with short episode of atrial fibrillation Reason for Consult: atrial fibrillation  HPI: 74 y.o. female with known paroxysmal nonvalvular atrial fibrillation apparently well controlled on medication management in the past without evidence of significant new issues. The patient recently has had a preoperative evaluation showing no evidence of significant new onset of chest discomfort shortness of breath or congestive heart failure and therefore was at low risk for cardiovascular complication. The patient underwent abdominal surgery which went well without evidence of significant complication but did have a short run of atrial fibrillation with rapid ventricular rate spontaneously reconverted back to normal sinus rhythm without significant symptoms. The patient did have some minimal amount of palpitation during that period of time. The patient also has known moderate mitral regurgitation for which she has been stable on appropriate medication management. The patient has had Rythmol for atrial fibrillation for which has been maintaining normal sinus rhythm as well as the use of diltiazem for heart rate control. Currently her heart rate is normal sinus rhythm at 62 bpm. She occasionally does have preventricular contractions. There has been some moderate hyperlipidemia not requiring further intervention at this time and stable. At this post surgery and is beginning to ambulate without evidence of significant cardiovascular symptoms  Past Medical History  Diagnosis Date  . Atrial fibrillation   . Hyperlipidemia   . PVC (premature ventricular contraction)   . Mitral insufficiency   . GERD  (gastroesophageal reflux disease)   . Cancer of breast     Left  . Hypothyroidism       Most Recent Cardiac Studies:    Surgical History:  Past Surgical History  Procedure Laterality Date  . Cesarean section      x2  . Abdominal hysterectomy    . Cholecystectomy    . Colonoscopy  2006  . Breast surgery      Left     Home Meds: Prior to Admission medications   Medication Sig Start Date End Date Taking? Authorizing Provider  acetaminophen (TYLENOL) 500 MG chewable tablet Chew 500 mg by mouth every 6 (six) hours as needed for pain.   Yes Historical Provider, MD  aspirin EC 81 MG tablet Take 81 mg by mouth daily.   Yes Historical Provider, MD  diazepam (VALIUM) 5 MG tablet Take 5 mg by mouth at bedtime as needed for anxiety.   Yes Historical Provider, MD  diltiazem (CARDIZEM CD) 120 MG 24 hr capsule Take 120 mg by mouth daily.   Yes Historical Provider, MD  docusate sodium (COLACE) 50 MG capsule Take 50 mg by mouth 2 (two) times daily.   Yes Historical Provider, MD  levothyroxine (SYNTHROID, LEVOTHROID) 50 MCG tablet Take 50 mcg by mouth daily before breakfast.   Yes Historical Provider, MD  neomycin (MYCIFRADIN) 500 MG tablet Take two (2) tablets at 6 pm and two (2) tablets at 11 pm the evening prior to surgery. 12/15/14  Yes Robert Bellow, MD  pantoprazole (PROTONIX) 40 MG tablet Take 40 mg by mouth daily.   Yes Historical Provider, MD  polyethylene glycol (MIRALAX / GLYCOLAX) packet Take 17 g by mouth daily.   Yes Historical Provider, MD  propafenone (RYTHMOL) 225 MG tablet Take 225 mg by mouth  2 (two) times daily.   Yes Historical Provider, MD  Propylene Glycol (SYSTANE BALANCE OP) Apply 1 drop to eye 2 (two) times daily as needed.   Yes Historical Provider, MD  enoxaparin (LOVENOX) 40 MG/0.4ML injection Inject 0.4 mLs (40 mg total) into the skin daily. 12/25/14   Robert Bellow, MD  polyethylene glycol powder [facility]) powder 255 grams one bottle for bowel  prep Patient not taking: Reported on 12/17/2014 12/15/14   Robert Bellow, MD    Inpatient Medications:  . aspirin EC  81 mg Oral Daily  . enoxaparin (LOVENOX) injection  40 mg Subcutaneous Q24H  . levothyroxine  50 mcg Oral QAC breakfast  . pantoprazole  40 mg Oral Daily  . propafenone  225 mg Oral BID  . sodium chloride  3 mL Intravenous Q12H      Allergies:  Allergies  Allergen Reactions  . Carafate [Sucralfate] Rash  . Sulfa Antibiotics Rash    History   Social History  . Marital Status: Married    Spouse Name: N/A  . Number of Children: N/A  . Years of Education: N/A   Occupational History  . Not on file.   Social History Main Topics  . Smoking status: Never Smoker   . Smokeless tobacco: Not on file  . Alcohol Use: No  . Drug Use: Not on file  . Sexual Activity: Not on file   Other Topics Concern  . Not on file   Social History Narrative     History reviewed. No pertinent family history.   Review of Systems Positive for abdominal discomfort Negative for: General:  for chills, fever, night sweats or weight changes.  Cardiovascular: PND orthopnea syncope dizziness  Dermatological skin lesions rashes Respiratory: Cough congestion Urologic: Frequent urination urination at night and hematuria Abdominal: negative for nausea, vomiting, diarrhea, bright red blood per rectum, melena, or hematemesis Neurologic: negative for visual changes, and/or hearing changes  All other systems reviewed and are otherwise negative except as noted above.  Labs: No results for input(s): CKTOTAL, CKMB, TROPONINI in the last 72 hours. Lab Results  Component Value Date   WBC 11.4* 12/14/2014   HGB 14.7 12/14/2014   HCT 45.1 12/14/2014   MCV 93 12/14/2014   PLT 284 12/14/2014   No results for input(s): NA, K, CL, CO2, BUN, CREATININE, CALCIUM, PROT, BILITOT, ALKPHOS, ALT, AST, GLUCOSE in the last 168 hours.  Invalid input(s): LABALBU No results found for: CHOL, HDL,  LDLCALC, TRIG No results found for: DDIMER  Radiology/Studies:  Dg Chest 2 View  12/14/2014   CLINICAL DATA:  Ovarian cancer.  EXAM: CHEST  2 VIEW  COMPARISON:  10/04/2009 .  FINDINGS: Mediastinum and hilar structures are normal. Lungs are clear. Heart size normal. No pleural effusion or pneumothorax. No acute bony abnormality identified. Interposition of the colon under the right hemidiaphragm noted.  IMPRESSION: No acute cardiopulmonary disease.   Electronically Signed   By: Marcello Moores  Register   On: 12/14/2014 12:53   Portable Chest 1 View  12/22/2014   CLINICAL DATA:  Port-A-Cath placement today.  EXAM: PORTABLE CHEST - 1 VIEW  COMPARISON:  PA and lateral chest 12/14/2014.  FINDINGS: Left subclavian approach Port-A-Cath is in place with the tip projecting over the lower superior vena cava. Tubing is intact. There is no pneumothorax. Lungs are clear. Heart size is normal.  IMPRESSION: Port-A-Cath projects in good position. Negative for pneumothorax or acute abnormality.   Electronically Signed   By: Inge Rise M.D.  On: 12/22/2014 17:36   Dg C-arm 1-60 Min-no Report  12/22/2014   CLINICAL DATA: Surgery   C-ARM 1-60 MINUTES  Fluoroscopy was utilized by the requesting physician.  No radiographic  interpretation.     EKG: Normal sinus rhythm with normal EKG  Weights: Filed Weights   12/22/14 1100 12/22/14 1837  Weight: 178 lb (80.74 kg) 189 lb (85.73 kg)     Physical Exam: Blood pressure 100/44, pulse 57, temperature 97.9 F (36.6 C), temperature source Oral, resp. rate 16, height 5\' 3"  (1.6 m), weight 189 lb (85.73 kg), SpO2 97 %. Body mass index is 33.49 kg/(m^2). General: Well developed, well nourished, in no acute distress. Head eyes ears nose throat: Normocephalic, atraumatic, sclera non-icteric, no xanthomas, nares are without discharge. No apparent thyromegaly and/or mass  Lungs: Normal respiratory effort. Clear bilaterally to auscultation without wheezes, rales, or rhonchi.   Heart: RRR with normal S1 S2. Without murmur gallop or rub PMI is normal size and placement carotid upstroke normal without bruit jugular venous pressure is normal Abdomen: Soft, non-tender, non-distended with normoactive bowel sounds. No hepatomegaly. No rebound/guarding. No obvious abdominal masses. Abdominal aorta is normal size without bruit Extremities: No edema cyanosis clubbing or ulcers  Peripheral : 2+ bilateral upper extremity pulses, 2+ bilateral femoral pulses 2+ bilateral dorsal pedal pulse Neuro: Alert and oriented X 3. No facial asymmetry. No focal deficit. Moves all extremities spontaneously. Musculoskeletal: Normal muscle tone without kyphosis Psych:  Responds to questions appropriately with a normal affect.    Assessment: 74 year old female with paroxysmal nonvalvular atrial fibrillation mixed hyperlipidemia general benign preventricular contractions with moderate mitral regurgitation with a short run of atrial fibrillation now back in normal sinus rhythm on appropriate medication management  Plan: 1. Continue Rythmol for maintenance of normal sinus rhythm without change 2. Reinstatement of diltiazem as able if patient is able to take oral medication management and does not have any significant issues with bradycardia during rehabilitation 3. No further intervention of the 9 preventricular contractions 4. [place] for mitral regurgitation although no further clinical issues as arisen at this time 5. No restrictions to rehabilitation from surgery 7. Possible reinstatement of anticoagulation for further risk reduction in paroxysmal atrial fibrillation at a safe period of time after surgical intervention Signed, Corey Skains M.D. [facility] 12/24/2014, 3:52 PM

## 2014-12-25 LAB — CBC WITH DIFFERENTIAL/PLATELET
Basophils Absolute: 0 10*3/uL (ref 0–0.1)
Basophils Relative: 0 %
EOS ABS: 0.2 10*3/uL (ref 0–0.7)
EOS PCT: 2 %
HCT: 37.4 % (ref 35.0–47.0)
HEMOGLOBIN: 12.1 g/dL (ref 12.0–16.0)
Lymphocytes Relative: 34 %
Lymphs Abs: 2.1 10*3/uL (ref 1.0–3.6)
MCH: 30.2 pg (ref 26.0–34.0)
MCHC: 32.4 g/dL (ref 32.0–36.0)
MCV: 93.1 fL (ref 80.0–100.0)
Monocytes Absolute: 0.8 10*3/uL (ref 0.2–0.9)
Monocytes Relative: 13 %
Neutro Abs: 3.2 10*3/uL (ref 1.4–6.5)
Neutrophils Relative %: 51 %
PLATELETS: 207 10*3/uL (ref 150–440)
RBC: 4.02 MIL/uL (ref 3.80–5.20)
RDW: 13.2 % (ref 11.5–14.5)
WBC: 6.3 10*3/uL (ref 3.6–11.0)

## 2014-12-25 LAB — BASIC METABOLIC PANEL
Anion gap: 5 (ref 5–15)
BUN: 8 mg/dL (ref 6–20)
CO2: 31 mmol/L (ref 22–32)
CREATININE: 0.84 mg/dL (ref 0.44–1.00)
Calcium: 8.5 mg/dL — ABNORMAL LOW (ref 8.9–10.3)
Chloride: 107 mmol/L (ref 101–111)
GFR calc Af Amer: >60 mL/min (ref >60)
GFR calc non Af Amer: >60 mL/min (ref >60)
Glucose, Bld: 81 mg/dL (ref 65–99)
POTASSIUM: 3.3 mmol/L — AB (ref 3.5–5.1)
Sodium: 143 mmol/L (ref 135–145)

## 2014-12-25 MED ORDER — POLYETHYLENE GLYCOL 3350 17 G PO PACK
17.0000 g | PACK | Freq: Every day | ORAL | Status: DC
  Administered 2014-12-25 – 2014-12-26 (×2): 17 g via ORAL
  Filled 2014-12-25 (×2): qty 1

## 2014-12-25 NOTE — Progress Notes (Signed)
[facility] Encounter Note  Patient: Jamie Conner / Admit Date: 12/22/2014 / Date of Encounter: 12/25/2014, 10:23 AM   Subjective: Some abdominal pain with palpitation  Review of Systems: Positive for: Abdominal pain and palpitations Negative for: Others previously listed  Objective: Telemetry: None but overall heart rate is controlled Physical Exam: Blood pressure 120/52, pulse 61, temperature 97.9 F (36.6 C), temperature source Oral, resp. rate 18, height 5\' 3"  (1.6 m), weight 189 lb (85.73 kg), SpO2 98 %. Body mass index is 33.49 kg/(m^2). General: Well developed, well nourished, in no acute distress. Head: Normocephalic, atraumatic, sclera non-icteric, no xanthomas, nares are without discharge. Neck: No apparent masses Lungs: Normal respirations with no wheezes, no rhonchi, no Rales and/or crackles   Heart: Regular rate and rhythm, normal S1-S2, no murmur, no rub, no gallop, PMI is normal size and placement, carotid upstroke normal without bruit, jugular venous pressure normal Abdomen: Soft, non-tender, non-distended with normoactive bowel sounds. No apparent hepatosplenomegaly. Abdominal aorta is normal size without bruit Extremities: No edema, no clubbing, no cyanosis, no ulcers,  Peripheral: 2+ radial, 2+ femoral, 2+ dorsal pedal pulses Neuro: Alert and oriented X 3. Moves all extremities spontaneously. Psych:  Responds to questions appropriately with a normal affect.   Intake/Output Summary (Last 24 hours) at 12/25/14 1023 Last data filed at 12/25/14 0841  Gross per 24 hour  Intake   1260 ml  Output   3000 ml  Net  -1740 ml    Inpatient Medications:  . aspirin EC  81 mg Oral Daily  . enoxaparin (LOVENOX) injection  40 mg Subcutaneous Q24H  . levothyroxine  50 mcg Oral QAC breakfast  . pantoprazole  40 mg Oral Daily  . propafenone  225 mg Oral BID  . sodium chloride  3 mL Intravenous Q12H   Infusions:    Labs:  Recent Labs   12/25/14 0707  NA 143  K 3.3*  CL 107  CO2 31  GLUCOSE 81  BUN 8  CREATININE 0.84  CALCIUM 8.5*   No results for input(s): AST, ALT, ALKPHOS, BILITOT, PROT, ALBUMIN in the last 72 hours.  Recent Labs  12/25/14 0707  WBC 6.3  NEUTROABS 3.2  HGB 12.1  HCT 37.4  MCV 93.1  PLT 207   No results for input(s): CKTOTAL, CKMB, TROPONINI in the last 72 hours. Invalid input(s): POCBNP No results for input(s): HGBA1C in the last 72 hours.   Weights: Filed Weights   12/22/14 1100 12/22/14 1837  Weight: 178 lb (80.74 kg) 189 lb (85.73 kg)     Radiology/Studies:  Dg Chest 2 View  12/14/2014   CLINICAL DATA:  Ovarian cancer.  EXAM: CHEST  2 VIEW  COMPARISON:  10/04/2009 .  FINDINGS: Mediastinum and hilar structures are normal. Lungs are clear. Heart size normal. No pleural effusion or pneumothorax. No acute bony abnormality identified. Interposition of the colon under the right hemidiaphragm noted.  IMPRESSION: No acute cardiopulmonary disease.   Electronically Signed   By: Marcello Moores  Register   On: 12/14/2014 12:53   Portable Chest 1 View  12/22/2014   CLINICAL DATA:  Port-A-Cath placement today.  EXAM: PORTABLE CHEST - 1 VIEW  COMPARISON:  PA and lateral chest 12/14/2014.  FINDINGS: Left subclavian approach Port-A-Cath is in place with the tip projecting over the lower superior vena cava. Tubing is intact. There is no pneumothorax. Lungs are clear. Heart size is normal.  IMPRESSION: Port-A-Cath projects in good position. Negative for pneumothorax or acute abnormality.  Electronically Signed   By: Inge Rise M.D.   On: 12/22/2014 17:36   Dg C-arm 1-60 Min-no Report  12/22/2014   CLINICAL DATA: Surgery   C-ARM 1-60 MINUTES  Fluoroscopy was utilized by the requesting physician.  No radiographic  interpretation.      Assessment and Recommendation  74 y.o. female 74 year old female with known nonvalvular paroxysmal atrial fibrillation on appropriate medication management maintaining  normal sinus rhythm on Rythmol with occasional preventricular contractions and moderate mitral regurgitation slowly improving from abdominal surgery 1. Continue Rythmol for maintenance of normal sinus rhythm 2. Abstain from diltiazem at this time due to concerns that the patient may have significant symptomatic bradycardia 3. No restrictions to recovery from abdominal surgery 4. No further intervention of benign preventricular contractions 5. Ambulate with adjustments of medications  Signed, Serafina Royals M.D. FACC

## 2014-12-25 NOTE — Progress Notes (Signed)
She is now postoperative day 3 after debulking of ovarian cancer. She also had insertion of Port-A-Cath in the left subclavian area.  She reports she is tolerating her diet satisfactorily. She is voiding satisfactorily. Has passed some gas per rectum. No bowel movement yet. She did express some concerns about a past history of constipation. She is walking slowly in the hallway. She is taking Norco as needed for pain.  Dr. Alveria Apley cardiology note indicates she is stable.  Vital signs are stable. She is awake alert and oriented. Her dressings at the Port-A-Cath site were removed. The Steri-Strips remain intact and are dry. The abdominal dressing was removed. The incision appears to be healing satisfactorily. The abdomen is soft with minimal tenderness.  Impression satisfactory progress following debulking of ovarian cancer and insertion of Port-A-Cath.. She has some concerns about constipation.  Plan is to begin MiraLAX 1 dose each day. Patient is not comfortable with discharge today  Plan is to work on walking in the hallway and give some additional time for observation and recovery.

## 2014-12-26 NOTE — Progress Notes (Signed)
Pt discharged to home. Discharge instructions and follow-up appointments discussed.

## 2014-12-26 NOTE — Discharge Summary (Signed)
This 74 year old female was admitted electively for exploratory laparotomy. She was carried to the operating room and had exploratory laparotomy with bilateral salpingo-oophorectomy and ureteral lysis, omentectomy, and insertion of Port-A-Cath   Postoperatively she was monitored in the hospital. She did have history of atrial fibrillation and did have a rapid ventricular response which was treated with Cardizem and resolved.  She has continued to progress. She is tolerating her diet. She has passed some flatus and small amount of stool. She is walking satisfactorily in the hallway. Vital signs are stable Her abdominal wound has had some scant serous drainage but no erythema.  Final diagnosis is ovarian cancer  Plan is to discharge today and follow up with Dr. Bary Castilla. 4 removal of skin clips also anticipating upcoming chemotherapy

## 2014-12-26 NOTE — Progress Notes (Signed)
[facility] Encounter Note  Patient: Jamie Conner / Admit Date: 12/22/2014 / Date of Encounter: 12/26/2014, 9:53 AM   Subjective: Some abdominal pain with palpitation  Review of Systems: Positive for: Abdominal pain and palpitations now resolving  Negative for: Others previously listed  Objective: Telemetry: None but overall heart rate is controlled Physical Exam: Blood pressure 118/59, pulse 85, temperature 98 F (36.7 C), temperature source Oral, resp. rate 16, height 5\' 3"  (1.6 m), weight 189 lb (85.73 kg), SpO2 97 %. Body mass index is 33.49 kg/(m^2). General: Well developed, well nourished, in no acute distress. Head: Normocephalic, atraumatic, sclera non-icteric, no xanthomas, nares are without discharge. Neck: No apparent masses Lungs: Normal respirations with no wheezes, no rhonchi, no Rales and/or crackles   Heart: Regular rate and rhythm, normal S1-S2, no murmur, no rub, no gallop, PMI is normal size and placement, carotid upstroke normal without bruit, jugular venous pressure normal Abdomen: Soft, non-tender, non-distended with normoactive bowel sounds. No apparent hepatosplenomegaly. Abdominal aorta is normal size without bruit Extremities: No edema, no clubbing, no cyanosis, no ulcers,  Peripheral: 2+ radial, 2+ femoral, 2+ dorsal pedal pulses Neuro: Alert and oriented X 3. Moves all extremities spontaneously. Psych:  Responds to questions appropriately with a normal affect.   Intake/Output Summary (Last 24 hours) at 12/26/14 0953 Last data filed at 12/26/14 0900  Gross per 24 hour  Intake   1263 ml  Output   2426 ml  Net  -1163 ml    Inpatient Medications:  . aspirin EC  81 mg Oral Daily  . enoxaparin (LOVENOX) injection  40 mg Subcutaneous Q24H  . levothyroxine  50 mcg Oral QAC breakfast  . pantoprazole  40 mg Oral Daily  . polyethylene glycol  17 g Oral Daily  . propafenone  225 mg Oral BID  . sodium chloride  3 mL Intravenous Q12H    Infusions:    Labs:  Recent Labs  12/25/14 0707  NA 143  K 3.3*  CL 107  CO2 31  GLUCOSE 81  BUN 8  CREATININE 0.84  CALCIUM 8.5*   No results for input(s): AST, ALT, ALKPHOS, BILITOT, PROT, ALBUMIN in the last 72 hours.  Recent Labs  12/25/14 0707  WBC 6.3  NEUTROABS 3.2  HGB 12.1  HCT 37.4  MCV 93.1  PLT 207   No results for input(s): CKTOTAL, CKMB, TROPONINI in the last 72 hours. Invalid input(s): POCBNP No results for input(s): HGBA1C in the last 72 hours.   Weights: Filed Weights   12/22/14 1100 12/22/14 1837  Weight: 178 lb (80.74 kg) 189 lb (85.73 kg)     Radiology/Studies:  Dg Chest 2 View  12/14/2014   CLINICAL DATA:  Ovarian cancer.  EXAM: CHEST  2 VIEW  COMPARISON:  10/04/2009 .  FINDINGS: Mediastinum and hilar structures are normal. Lungs are clear. Heart size normal. No pleural effusion or pneumothorax. No acute bony abnormality identified. Interposition of the colon under the right hemidiaphragm noted.  IMPRESSION: No acute cardiopulmonary disease.   Electronically Signed   By: Marcello Moores  Register   On: 12/14/2014 12:53   Portable Chest 1 View  12/22/2014   CLINICAL DATA:  Port-A-Cath placement today.  EXAM: PORTABLE CHEST - 1 VIEW  COMPARISON:  PA and lateral chest 12/14/2014.  FINDINGS: Left subclavian approach Port-A-Cath is in place with the tip projecting over the lower superior vena cava. Tubing is intact. There is no pneumothorax. Lungs are clear. Heart size is normal.  IMPRESSION:  Port-A-Cath projects in good position. Negative for pneumothorax or acute abnormality.   Electronically Signed   By: Inge Rise M.D.   On: 12/22/2014 17:36   Dg C-arm 1-60 Min-no Report  12/22/2014   CLINICAL DATA: Surgery   C-ARM 1-60 MINUTES  Fluoroscopy was utilized by the requesting physician.  No radiographic  interpretation.      Assessment and Recommendation  74 y.o. female 74 year old female with known nonvalvular paroxysmal atrial fibrillation on  appropriate medication management maintaining normal sinus rhythm on Rythmol with occasional preventricular contractions and moderate mitral regurgitation slowly improving from abdominal surgery 1. Continue Rythmol for maintenance of normal sinus rhythm 2. Abstain from diltiazem at this time due to concerns that the patient may have significant symptomatic bradycardia 3. No restrictions to recovery from abdominal surgery 4. No further intervention of benign preventricular contractions 5. Ambulate with adjustments of medications and discharged home from cardiac standpoint with follow up in 2 weeks  Signed, Serafina Royals M.D. FACC

## 2014-12-27 ENCOUNTER — Encounter: Payer: Self-pay | Admitting: Obstetrics and Gynecology

## 2014-12-27 ENCOUNTER — Ambulatory Visit (INDEPENDENT_AMBULATORY_CARE_PROVIDER_SITE_OTHER): Payer: PPO | Admitting: General Surgery

## 2014-12-27 DIAGNOSIS — Z90722 Acquired absence of ovaries, bilateral: Secondary | ICD-10-CM

## 2014-12-27 LAB — CYTOLOGY - NON PAP

## 2014-12-28 ENCOUNTER — Other Ambulatory Visit: Payer: Self-pay | Admitting: *Deleted

## 2014-12-28 DIAGNOSIS — C569 Malignant neoplasm of unspecified ovary: Secondary | ICD-10-CM

## 2014-12-28 LAB — SURGICAL PATHOLOGY

## 2014-12-28 NOTE — Progress Notes (Signed)
Patient ID: Jamie Conner, female   DOB: 03-20-1941, 74 y.o.   MRN: [id number]  No chief complaint on file.   HPI Jamie Conner is a 74 y.o. female.  The patient was seen at her home 5 days after undergoing bilateral salpingo-oophorectomy and tumor debulking. She is done very well. No narcotic needs since discharge. HPI  Past Medical History  Diagnosis Date  . Atrial fibrillation   . Hyperlipidemia   . PVC (premature ventricular contraction)   . Mitral insufficiency   . GERD (gastroesophageal reflux disease)   . Cancer of breast     Left  . Hypothyroidism     Past Surgical History  Procedure Laterality Date  . Cesarean section      x2  . Abdominal hysterectomy    . Cholecystectomy    . Colonoscopy  2006  . Breast surgery      Left  . Laparotomy with staging N/A 12/22/2014    Procedure: LAPAROTOMY WITH STAGING;  Surgeon: Gillis Ends, MD;  Location: [facility];  Service: Gynecology;  Laterality: N/A;  . Salpingoophorectomy Bilateral 12/22/2014    Procedure: SALPINGO OOPHORECTOMY;  Surgeon: Gillis Ends, MD;  Location: [facility];  Service: Gynecology;  Laterality: Bilateral;  . Debulking N/A 12/22/2014    Procedure: DEBULKING;  Surgeon: Gillis Ends, MD;  Location: [facility];  Service: Gynecology;  Laterality: N/A;  . Laparotomy N/A 12/22/2014    Procedure: EXPLORATORY LAPAROTOMY;  Surgeon: Robert Bellow, MD;  Location: [facility];  Service: General;  Laterality: N/A;  . Portacath placement Right 12/22/2014    Procedure: INSERTION PORT-A-CATH;  Surgeon: Robert Bellow, MD;  Location: [facility];  Service: General;  Laterality: Right;  . Peripheral vascular catheterization Left 12/22/2014    Procedure: PORTA CATH INSERTION;  Surgeon: Robert Bellow, MD;  Location: [facility];  Service: General;  Laterality: Left;    No family history on file.  Social History History  Substance Use Topics  . Smoking status: Never Smoker   . Smokeless tobacco: Not  on file  . Alcohol Use: No    Allergies  Allergen Reactions  . Carafate [Sucralfate] Rash  . Sulfa Antibiotics Rash    Current Outpatient Prescriptions  Medication Sig Dispense Refill  . acetaminophen (TYLENOL) 500 MG chewable tablet Chew 500 mg by mouth every 6 (six) hours as needed for pain.    Marland Kitchen aspirin EC 81 MG tablet Take 81 mg by mouth daily.    . diazepam (VALIUM) 5 MG tablet Take 5 mg by mouth at bedtime as needed for anxiety.    . docusate sodium (COLACE) 50 MG capsule Take 50 mg by mouth 2 (two) times daily.    Marland Kitchen enoxaparin (LOVENOX) 40 MG/0.4ML injection Inject 0.4 mLs (40 mg total) into the skin daily. 28 mL 0  . levothyroxine (SYNTHROID, LEVOTHROID) 50 MCG tablet Take 50 mcg by mouth daily before breakfast.    . pantoprazole (PROTONIX) 40 MG tablet Take 40 mg by mouth daily.    . polyethylene glycol (MIRALAX / GLYCOLAX) packet Take 17 g by mouth daily.    . propafenone (RYTHMOL) 225 MG tablet Take 225 mg by mouth 2 (two) times daily.     No current facility-administered medications for this visit.    Review of Systems Review of Systems  There were no vitals taken for this visit.  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 Peritoneal washings were positive for malignant cells.  Assessment    Doing well status post tumor debulking.    Plan    The patient's husband has been able to administer Lovenox without difficulty. This will be continued for 4 weeks post procedure.  Arrangements are in place for the patient return to the office on May 12, for staple removal with the nurse.  Arrangements are in progress for evaluation by GYN oncology and medical oncology on 01/05/2015 at the cancer center.       Robert Bellow 12/28/2014, 12:32 PM

## 2014-12-29 ENCOUNTER — Telehealth: Payer: Self-pay | Admitting: *Deleted

## 2014-12-29 NOTE — Telephone Encounter (Signed)
Patient needs to come next week to see Dr. Oliva Bustard and Dr. Theora Gianotti to discuss plan of care.  Pt is already scheduled to see Dr. Oliva Bustard next week and to start her chemo education class. Will arrange for patient to see Dr. Theora Gianotti.  RN spoke with patient's husband on behalf of patient. Pt is aware of next apt. Offered apt to see Dr. Theora Gianotti next Wed. 18'th. Asked pt to arrive at 830am. RN to call patient.  Spoke with Dr. Theora Gianotti. This apt can be arranged per Dr. Dwyane Luo request.

## 2014-12-30 ENCOUNTER — Ambulatory Visit (INDEPENDENT_AMBULATORY_CARE_PROVIDER_SITE_OTHER): Payer: PPO | Admitting: *Deleted

## 2014-12-30 DIAGNOSIS — Z90722 Acquired absence of ovaries, bilateral: Secondary | ICD-10-CM

## 2014-12-30 NOTE — Progress Notes (Signed)
Patient came in today for a wound check.  The wound is clean, with no signs of infection noted.The staples was removed and steri strips applied Follow up as scheduled.

## 2014-12-31 NOTE — Patient Instructions (Signed)
Paclitaxel injection What is this medicine? PACLITAXEL (PAK li TAX el) is a chemotherapy drug. It targets fast dividing cells, like cancer cells, and causes these cells to die. This medicine is used to treat ovarian cancer, breast cancer, and other cancers. This medicine may be used for other purposes; ask your health care provider or pharmacist if you have questions. COMMON BRAND NAME(S): Onxol, Taxol What should I tell my health care provider before I take this medicine? They need to know if you have any of these conditions: -blood disorders -irregular heartbeat -infection (especially a virus infection such as chickenpox, cold sores, or herpes) -liver disease -previous or ongoing radiation therapy -an unusual or allergic reaction to paclitaxel, alcohol, polyoxyethylated castor oil, other chemotherapy agents, other medicines, foods, dyes, or preservatives -pregnant or trying to get pregnant -breast-feeding How should I use this medicine? This drug is given as an infusion into a vein. It is administered in a hospital or clinic by a specially trained health care professional. Talk to your pediatrician regarding the use of this medicine in children. Special care may be needed. Overdosage: If you think you have taken too much of this medicine contact a poison control center or emergency room at once. NOTE: This medicine is only for you. Do not share this medicine with others. What if I miss a dose? It is important not to miss your dose. Call your doctor or health care professional if you are unable to keep an appointment. What may interact with this medicine? Do not take this medicine with any of the following medications: -disulfiram -metronidazole This medicine may also interact with the following medications: -cyclosporine -diazepam -ketoconazole -medicines to increase blood counts like filgrastim, pegfilgrastim, sargramostim -other chemotherapy drugs like cisplatin, doxorubicin,  epirubicin, etoposide, teniposide, vincristine -quinidine -testosterone -vaccines -verapamil Talk to your doctor or health care professional before taking any of these medicines: -acetaminophen -aspirin -ibuprofen -ketoprofen -naproxen This list may not describe all possible interactions. Give your health care provider a list of all the medicines, herbs, non-prescription drugs, or dietary supplements you use. Also tell them if you smoke, drink alcohol, or use illegal drugs. Some items may interact with your medicine. What should I watch for while using this medicine? Your condition will be monitored carefully while you are receiving this medicine. You will need important blood work done while you are taking this medicine. This drug may make you feel generally unwell. This is not uncommon, as chemotherapy can affect healthy cells as well as cancer cells. Report any side effects. Continue your course of treatment even though you feel ill unless your doctor tells you to stop. In some cases, you may be given additional medicines to help with side effects. Follow all directions for their use. Call your doctor or health care professional for advice if you get a fever, chills or sore throat, or other symptoms of a cold or flu. Do not treat yourself. This drug decreases your body's ability to fight infections. Try to avoid being around people who are sick. This medicine may increase your risk to bruise or bleed. Call your doctor or health care professional if you notice any unusual bleeding. Be careful brushing and flossing your teeth or using a toothpick because you may get an infection or bleed more easily. If you have any dental work done, tell your dentist you are receiving this medicine. Avoid taking products that contain aspirin, acetaminophen, ibuprofen, naproxen, or ketoprofen unless instructed by your doctor. These medicines may hide a fever.   Do not become pregnant while taking this medicine.  Women should inform their doctor if they wish to become pregnant or think they might be pregnant. There is a potential for serious side effects to an unborn child. Talk to your health care professional or pharmacist for more information. Do not breast-feed an infant while taking this medicine. Men are advised not to father a child while receiving this medicine. What side effects may I notice from receiving this medicine? Side effects that you should report to your doctor or health care professional as soon as possible: -allergic reactions like skin rash, itching or hives, swelling of the face, lips, or tongue -low blood counts - This drug may decrease the number of white blood cells, red blood cells and platelets. You may be at increased risk for infections and bleeding. -signs of infection - fever or chills, cough, sore throat, pain or difficulty passing urine -signs of decreased platelets or bleeding - bruising, pinpoint red spots on the skin, black, tarry stools, nosebleeds -signs of decreased red blood cells - unusually weak or tired, fainting spells, lightheadedness -breathing problems -chest pain -high or low blood pressure -mouth sores -nausea and vomiting -pain, swelling, redness or irritation at the injection site -pain, tingling, numbness in the hands or feet -slow or irregular heartbeat -swelling of the ankle, feet, hands Side effects that usually do not require medical attention (report to your doctor or health care professional if they continue or are bothersome): -bone pain -complete hair loss including hair on your head, underarms, pubic hair, eyebrows, and eyelashes -changes in the color of fingernails -diarrhea -loosening of the fingernails -loss of appetite -muscle or joint pain -red flush to skin -sweating This list may not describe all possible side effects. Call your doctor for medical advice about side effects. You may report side effects to FDA at  1-800-FDA-1088. Where should I keep my medicine? This drug is given in a hospital or clinic and will not be stored at home. NOTE: This sheet is a summary. It may not cover all possible information. If you have questions about this medicine, talk to your doctor, pharmacist, or health care provider.  2015, Elsevier/Gold Standard. (2012-09-29 16:41:21)  Carboplatin injection What is this medicine? CARBOPLATIN (KAR boe pla tin) is a chemotherapy drug. It targets fast dividing cells, like cancer cells, and causes these cells to die. This medicine is used to treat ovarian cancer and many other cancers. This medicine may be used for other purposes; ask your health care provider or pharmacist if you have questions. COMMON BRAND NAME(S): Paraplatin What should I tell my health care provider before I take this medicine? They need to know if you have any of these conditions: -blood disorders -hearing problems -kidney disease -recent or ongoing radiation therapy -an unusual or allergic reaction to carboplatin, cisplatin, other chemotherapy, other medicines, foods, dyes, or preservatives -pregnant or trying to get pregnant -breast-feeding How should I use this medicine? This drug is usually given as an infusion into a vein. It is administered in a hospital or clinic by a specially trained health care professional. Talk to your pediatrician regarding the use of this medicine in children. Special care may be needed. Overdosage: If you think you have taken too much of this medicine contact a poison control center or emergency room at once. NOTE: This medicine is only for you. Do not share this medicine with others. What if I miss a dose? It is important not to miss a dose. Call your   doctor or health care professional if you are unable to keep an appointment. What may interact with this medicine? -medicines for seizures -medicines to increase blood counts like filgrastim, pegfilgrastim,  sargramostim -some antibiotics like amikacin, gentamicin, neomycin, streptomycin, tobramycin -vaccines Talk to your doctor or health care professional before taking any of these medicines: -acetaminophen -aspirin -ibuprofen -ketoprofen -naproxen This list may not describe all possible interactions. Give your health care provider a list of all the medicines, herbs, non-prescription drugs, or dietary supplements you use. Also tell them if you smoke, drink alcohol, or use illegal drugs. Some items may interact with your medicine. What should I watch for while using this medicine? Your condition will be monitored carefully while you are receiving this medicine. You will need important blood work done while you are taking this medicine. This drug may make you feel generally unwell. This is not uncommon, as chemotherapy can affect healthy cells as well as cancer cells. Report any side effects. Continue your course of treatment even though you feel ill unless your doctor tells you to stop. In some cases, you may be given additional medicines to help with side effects. Follow all directions for their use. Call your doctor or health care professional for advice if you get a fever, chills or sore throat, or other symptoms of a cold or flu. Do not treat yourself. This drug decreases your body's ability to fight infections. Try to avoid being around people who are sick. This medicine may increase your risk to bruise or bleed. Call your doctor or health care professional if you notice any unusual bleeding. Be careful brushing and flossing your teeth or using a toothpick because you may get an infection or bleed more easily. If you have any dental work done, tell your dentist you are receiving this medicine. Avoid taking products that contain aspirin, acetaminophen, ibuprofen, naproxen, or ketoprofen unless instructed by your doctor. These medicines may hide a fever. Do not become pregnant while taking this  medicine. Women should inform their doctor if they wish to become pregnant or think they might be pregnant. There is a potential for serious side effects to an unborn child. Talk to your health care professional or pharmacist for more information. Do not breast-feed an infant while taking this medicine. What side effects may I notice from receiving this medicine? Side effects that you should report to your doctor or health care professional as soon as possible: -allergic reactions like skin rash, itching or hives, swelling of the face, lips, or tongue -signs of infection - fever or chills, cough, sore throat, pain or difficulty passing urine -signs of decreased platelets or bleeding - bruising, pinpoint red spots on the skin, black, tarry stools, nosebleeds -signs of decreased red blood cells - unusually weak or tired, fainting spells, lightheadedness -breathing problems -changes in hearing -changes in vision -chest pain -high blood pressure -low blood counts - This drug may decrease the number of white blood cells, red blood cells and platelets. You may be at increased risk for infections and bleeding. -nausea and vomiting -pain, swelling, redness or irritation at the injection site -pain, tingling, numbness in the hands or feet -problems with balance, talking, walking -trouble passing urine or change in the amount of urine Side effects that usually do not require medical attention (report to your doctor or health care professional if they continue or are bothersome): -hair loss -loss of appetite -metallic taste in the mouth or changes in taste This list may not describe all   possible side effects. Call your doctor for medical advice about side effects. You may report side effects to FDA at 1-800-FDA-1088. Where should I keep my medicine? This drug is given in a hospital or clinic and will not be stored at home. NOTE: This sheet is a summary. It may not cover all possible information. If you  have questions about this medicine, talk to your doctor, pharmacist, or health care provider.  2015, [employer]vier/Gold Standard. (2007-11-11 14:38:05)  

## 2015-01-03 ENCOUNTER — Inpatient Hospital Stay (HOSPITAL_BASED_OUTPATIENT_CLINIC_OR_DEPARTMENT_OTHER): Payer: PPO | Admitting: Oncology

## 2015-01-03 ENCOUNTER — Inpatient Hospital Stay: Payer: PPO | Attending: Oncology

## 2015-01-03 ENCOUNTER — Telehealth: Payer: Self-pay | Admitting: *Deleted

## 2015-01-03 VITALS — BP 122/74 | HR 66 | Wt 174.4 lb

## 2015-01-03 DIAGNOSIS — Z90722 Acquired absence of ovaries, bilateral: Secondary | ICD-10-CM | POA: Diagnosis not present

## 2015-01-03 DIAGNOSIS — E785 Hyperlipidemia, unspecified: Secondary | ICD-10-CM | POA: Diagnosis not present

## 2015-01-03 DIAGNOSIS — Z853 Personal history of malignant neoplasm of breast: Secondary | ICD-10-CM

## 2015-01-03 DIAGNOSIS — I340 Nonrheumatic mitral (valve) insufficiency: Secondary | ICD-10-CM | POA: Diagnosis not present

## 2015-01-03 DIAGNOSIS — I480 Paroxysmal atrial fibrillation: Secondary | ICD-10-CM | POA: Diagnosis not present

## 2015-01-03 DIAGNOSIS — C562 Malignant neoplasm of left ovary: Secondary | ICD-10-CM

## 2015-01-03 DIAGNOSIS — C561 Malignant neoplasm of right ovary: Secondary | ICD-10-CM

## 2015-01-03 DIAGNOSIS — Z79899 Other long term (current) drug therapy: Secondary | ICD-10-CM

## 2015-01-03 DIAGNOSIS — Z7982 Long term (current) use of aspirin: Secondary | ICD-10-CM | POA: Insufficient documentation

## 2015-01-03 DIAGNOSIS — K219 Gastro-esophageal reflux disease without esophagitis: Secondary | ICD-10-CM | POA: Diagnosis not present

## 2015-01-03 DIAGNOSIS — Z9223 Personal history of estrogen therapy: Secondary | ICD-10-CM

## 2015-01-03 DIAGNOSIS — C569 Malignant neoplasm of unspecified ovary: Secondary | ICD-10-CM

## 2015-01-03 DIAGNOSIS — R1030 Lower abdominal pain, unspecified: Secondary | ICD-10-CM | POA: Diagnosis not present

## 2015-01-03 DIAGNOSIS — Z9889 Other specified postprocedural states: Secondary | ICD-10-CM | POA: Insufficient documentation

## 2015-01-03 DIAGNOSIS — E039 Hypothyroidism, unspecified: Secondary | ICD-10-CM | POA: Insufficient documentation

## 2015-01-03 DIAGNOSIS — Z923 Personal history of irradiation: Secondary | ICD-10-CM | POA: Diagnosis not present

## 2015-01-03 LAB — CBC WITH DIFFERENTIAL/PLATELET
BASOS PCT: 1 %
Basophils Absolute: 0 10*3/uL (ref 0–0.1)
Eosinophils Absolute: 1 10*3/uL — ABNORMAL HIGH (ref 0–0.7)
Eosinophils Relative: 12 %
HEMATOCRIT: 42.5 % (ref 35.0–47.0)
HEMOGLOBIN: 13.9 g/dL (ref 12.0–16.0)
Lymphocytes Relative: 27 %
Lymphs Abs: 2.3 10*3/uL (ref 1.0–3.6)
MCH: 30.1 pg (ref 26.0–34.0)
MCHC: 32.7 g/dL (ref 32.0–36.0)
MCV: 91.9 fL (ref 80.0–100.0)
MONO ABS: 0.6 10*3/uL (ref 0.2–0.9)
MONOS PCT: 7 %
NEUTROS ABS: 4.5 10*3/uL (ref 1.4–6.5)
Neutrophils Relative %: 53 %
Platelets: 325 10*3/uL (ref 150–440)
RBC: 4.62 MIL/uL (ref 3.80–5.20)
RDW: 13.1 % (ref 11.5–14.5)
WBC: 8.4 10*3/uL (ref 3.6–11.0)

## 2015-01-03 LAB — COMPREHENSIVE METABOLIC PANEL
ALBUMIN: 3.5 g/dL (ref 3.5–5.0)
ALT: 15 U/L (ref 14–54)
AST: 15 U/L (ref 15–41)
Alkaline Phosphatase: 87 U/L (ref 38–126)
Anion gap: 3 — ABNORMAL LOW (ref 5–15)
BILIRUBIN TOTAL: 0.3 mg/dL (ref 0.3–1.2)
BUN: 14 mg/dL (ref 6–20)
CHLORIDE: 105 mmol/L (ref 101–111)
CO2: 29 mmol/L (ref 22–32)
Calcium: 9.2 mg/dL (ref 8.9–10.3)
Creatinine, Ser: 0.96 mg/dL (ref 0.44–1.00)
GFR calc Af Amer: >60 mL/min (ref >60)
GFR, EST NON AFRICAN AMERICAN: 58 mL/min — AB (ref >60)
GLUCOSE: 91 mg/dL (ref 65–99)
POTASSIUM: 4.2 mmol/L (ref 3.5–5.1)
Sodium: 137 mmol/L (ref 135–145)
Total Protein: 7 g/dL (ref 6.5–8.1)

## 2015-01-03 NOTE — Telephone Encounter (Signed)
Patient can use lovenox for as prescribed days on RX.  There was no refills entered into the system.

## 2015-01-03 NOTE — Telephone Encounter (Signed)
-----   Message from [place], [place] sent at 01/03/2015  5:02 PM EDT ----- Regarding: medication question Patient wanted to know how long she will need to continue her lovenox injections. Dr. Oliva Bustard wanted this decision to be made by the surgeon. Please advise the patient.

## 2015-01-04 ENCOUNTER — Other Ambulatory Visit: Payer: Self-pay | Admitting: *Deleted

## 2015-01-04 ENCOUNTER — Ambulatory Visit: Payer: PPO | Admitting: General Surgery

## 2015-01-04 ENCOUNTER — Inpatient Hospital Stay: Payer: PPO

## 2015-01-04 DIAGNOSIS — C562 Malignant neoplasm of left ovary: Secondary | ICD-10-CM

## 2015-01-04 DIAGNOSIS — I493 Ventricular premature depolarization: Secondary | ICD-10-CM | POA: Insufficient documentation

## 2015-01-04 DIAGNOSIS — R002 Palpitations: Secondary | ICD-10-CM | POA: Insufficient documentation

## 2015-01-04 DIAGNOSIS — C569 Malignant neoplasm of unspecified ovary: Secondary | ICD-10-CM

## 2015-01-04 DIAGNOSIS — E782 Mixed hyperlipidemia: Secondary | ICD-10-CM | POA: Insufficient documentation

## 2015-01-04 MED ORDER — LIDOCAINE-PRILOCAINE 2.5-2.5 % EX CREA: 1.0000 "application " | TOPICAL_CREAM | CUTANEOUS | Status: DC | PRN

## 2015-01-04 NOTE — Progress Notes (Signed)
Patient ID: Jamie Conner, female   DOB: February 19, 1941, 74 y.o.   MRN: [id number]  No chief complaint on file.   HPI Jamie Conner is a 74 y.o. female.  Now 13 days post exploration. Tolerating diet well.  Bowel movements moving well.   HPI  Past Medical History  Diagnosis Date  . Atrial fibrillation   . Hyperlipidemia   . PVC (premature ventricular contraction)   . Mitral insufficiency   . GERD (gastroesophageal reflux disease)   . Cancer of breast     Left  . Hypothyroidism     Past Surgical History  Procedure Laterality Date  . Cesarean section      x2  . Abdominal hysterectomy    . Cholecystectomy    . Colonoscopy  2006  . Breast surgery      Left  . Laparotomy with staging N/A 12/22/2014    Procedure: LAPAROTOMY WITH STAGING;  Surgeon: Gillis Ends, MD;  Location: [facility];  Service: Gynecology;  Laterality: N/A;  . Salpingoophorectomy Bilateral 12/22/2014    Procedure: SALPINGO OOPHORECTOMY;  Surgeon: Gillis Ends, MD;  Location: [facility];  Service: Gynecology;  Laterality: Bilateral;  . Debulking N/A 12/22/2014    Procedure: DEBULKING;  Surgeon: Gillis Ends, MD;  Location: [facility];  Service: Gynecology;  Laterality: N/A;  . Laparotomy N/A 12/22/2014    Procedure: EXPLORATORY LAPAROTOMY;  Surgeon: Robert Bellow, MD;  Location: [facility];  Service: General;  Laterality: N/A;  . Portacath placement Right 12/22/2014    Procedure: INSERTION PORT-A-CATH;  Surgeon: Robert Bellow, MD;  Location: [facility];  Service: General;  Laterality: Right;  . Peripheral vascular catheterization Left 12/22/2014    Procedure: PORTA CATH INSERTION;  Surgeon: Robert Bellow, MD;  Location: [facility];  Service: General;  Laterality: Left;    No family history on file.  Social History History  Substance Use Topics  . Smoking status: Never Smoker   . Smokeless tobacco: Not on file  . Alcohol Use: No    Allergies  Allergen Reactions  . Carafate  [Sucralfate] Rash  . Sulfa Antibiotics Rash    Current Outpatient Prescriptions  Medication Sig Dispense Refill  . acetaminophen (TYLENOL) 500 MG chewable tablet Chew 500 mg by mouth every 6 (six) hours as needed for pain.    Marland Kitchen aspirin EC 81 MG tablet Take 81 mg by mouth daily.    . diazepam (VALIUM) 5 MG tablet Take 5 mg by mouth at bedtime as needed for anxiety.    . docusate sodium (COLACE) 50 MG capsule Take 50 mg by mouth 2 (two) times daily.    Marland Kitchen enoxaparin (LOVENOX) 40 MG/0.4ML injection Inject 0.4 mLs (40 mg total) into the skin daily. 28 mL 0  . levothyroxine (SYNTHROID, LEVOTHROID) 50 MCG tablet Take 50 mcg by mouth daily before breakfast.    . lidocaine-prilocaine (EMLA) cream Apply 1 application topically as needed. 30 g 0  . pantoprazole (PROTONIX) 40 MG tablet Take 40 mg by mouth daily.    . polyethylene glycol (MIRALAX / GLYCOLAX) packet Take 17 g by mouth daily.    . propafenone (RYTHMOL) 225 MG tablet Take 225 mg by mouth 2 (two) times daily.     No current facility-administered medications for this visit.    Review of Systems Review of Systems  There were no vitals taken for this visit.  Physical Exam Physical Exam  Constitutional: She appears well-developed and well-nourished.  Cardiovascular: Normal rate and regular  rhythm.   Pulmonary/Chest: Effort normal and breath sounds normal.  Abdominal: Soft. Bowel sounds are normal.        Assessment    Doing well post exploration/ debulking.     Plan    Follow up on as needed basis.         Robert Bellow 01/04/2015, 8:56 PM

## 2015-01-04 NOTE — Progress Notes (Signed)
[place] @ [facility] [phone]  Fax:([phone]) [place]: 05/04/1941  MR#: [id number]  XIP#:[id number]  Patient Care Team: Jerrol Banana., MD as PCP - General (Family Medicine) [facility] Gaetana Michaelis, MD as Referring Physician (Obstetrics and Gynecology) Robert Bellow, MD as Consulting Physician (General Surgery)  CHIEF COMPLAINT:  Chief Complaint  Patient presents with  . 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 2.   carcinoma  of breast ( left) status post lumpectomy ,adiation  therapyand 5 years of tamoxifen. 3.history of atrial fibrillation being managed by cardiologist 4.  Status post bilateral salpingo-oophorectomy on fourth of May, 2016.  with optimal debulking surgery 5.  Starting chemotherapy with carboplatinum and Taxol in dose dense fashion from Jan 02, 2015     Ovarian cancer   12/16/2014 Initial Diagnosis Ovarian cancer    Oncology Flowsheet 12/22/2014 12/23/2014 12/24/2014 12/25/2014 12/26/2014  dexamethasone (DECADRON) IJ - - - - -  enoxaparin (LOVENOX) [place] - 40 mg 40 mg 40 mg 40 mg  ondansetron (ZOFRAN) IV - - - - -  scopolamine 1 MG/3DAYS (TRANSDERM-SCOP) TD - - - - -    INTERVAL HISTORY: 74 year old lady with a history of carcinoma of ovary status post bilateral salpingo-oophorectomy on May 4.  Patient is here for ongoing evaluation gradually recovering from surgery had a port placement.  Appetite has been improving.  No nausea.  No vomiting.  Somewhat depressed and anxious here for evaluation  REVIEW OF SYSTEMS:   GENERAL:  Feels good.  Active.  No fevers, sweats or weight loss. PERFORMANCE STATUS (ECOG):  0 HEENT:  No visual changes, runny nose, sore throat, mouth sores or tenderness. Lungs: No shortness of breath or cough.  No hemoptysis. Cardiac:  No chest  pain, palpitations, orthopnea, or PND. GI:  abdominal discomfort.  Abdominal pain in lower abdominal area GU:  No urgency, frequency, dysuria, or hematuria. Musculoskeletal:  No back pain.  No joint pain.  No muscle tenderness. Extremities:  No pain or swelling. Skin:  No rashes or skin changes. Neuro:  No headache, numbness or weakness, balance or coordination issues. Endocrine:  No diabetes, thyroid issues, hot flashes or night sweats. Psych:  No mood changes, depression or anxiety. Pain:  No focal pain. Review of systems:  All other systems reviewed and found to be negative.  As per HPI. Otherwise, a complete review of systems is negatve.  PAST MEDICAL HISTORY: Past Medical History  Diagnosis Date  . Atrial fibrillation   . Hyperlipidemia   . PVC (premature ventricular contraction)   . Mitral insufficiency   . GERD (gastroesophageal reflux disease)   . Cancer of breast     Left  . Hypothyroidism     PAST SURGICAL HISTORY: Past Surgical History  Procedure Laterality Date  . Cesarean section      x2  . Abdominal hysterectomy    . Cholecystectomy    . Colonoscopy  2006  . Breast surgery      Left  . Laparotomy with staging N/A 12/22/2014    Procedure: LAPAROTOMY WITH STAGING;  Surgeon: Gillis Ends, MD;  Location: [facility];  Service: Gynecology;  Laterality: N/A;  . Salpingoophorectomy Bilateral 12/22/2014    Procedure: SALPINGO OOPHORECTOMY;  Surgeon: Gillis Ends, MD;  Location: [facility];  Service: Gynecology;  Laterality: Bilateral;  .  Debulking N/A 12/22/2014    Procedure: DEBULKING;  Surgeon: Gillis Ends, MD;  Location: [facility];  Service: Gynecology;  Laterality: N/A;  . Laparotomy N/A 12/22/2014    Procedure: EXPLORATORY LAPAROTOMY;  Surgeon: Robert Bellow, MD;  Location: [facility];  Service: General;  Laterality: N/A;  . Portacath placement Right 12/22/2014    Procedure: INSERTION PORT-A-CATH;  Surgeon: Robert Bellow, MD;  Location:  [facility];  Service: General;  Laterality: Right;  . Peripheral vascular catheterization Left 12/22/2014    Procedure: PORTA CATH INSERTION;  Surgeon: Robert Bellow, MD;  Location: [facility];  Service: General;  Laterality: Left;    FAMILY HISTORY No family history on file.  GYNECOLOGIC HISTORY:  No LMP recorded. Patient is postmenopausal.     ADVANCED DIRECTIVES:    HEALTH MAINTENANCE: History  Substance Use Topics  . Smoking status: Never Smoker   . Smokeless tobacco: Not on file  . Alcohol Use: No     Colonoscopy:  PAP:  Bone density:  Lipid panel:  Allergies  Allergen Reactions  . Carafate [Sucralfate] Rash  . Sulfa Antibiotics Rash    Current Outpatient Prescriptions  Medication Sig Dispense Refill  . acetaminophen (TYLENOL) 500 MG chewable tablet Chew 500 mg by mouth every 6 (six) hours as needed for pain.    Marland Kitchen aspirin EC 81 MG tablet Take 81 mg by mouth daily.    . diazepam (VALIUM) 5 MG tablet Take 5 mg by mouth at bedtime as needed for anxiety.    . enoxaparin (LOVENOX) 40 MG/0.4ML injection Inject 0.4 mLs (40 mg total) into the skin daily. 28 mL 0  . levothyroxine (SYNTHROID, LEVOTHROID) 50 MCG tablet Take 50 mcg by mouth daily before breakfast.    . pantoprazole (PROTONIX) 40 MG tablet Take 40 mg by mouth daily.    . propafenone (RYTHMOL) 225 MG tablet Take 225 mg by mouth 2 (two) times daily.    Marland Kitchen docusate sodium (COLACE) 50 MG capsule Take 50 mg by mouth 2 (two) times daily.    . polyethylene glycol (MIRALAX / GLYCOLAX) packet Take 17 g by mouth daily.     No current facility-administered medications for this visit.    OBJECTIVE:  Filed Vitals:   01/03/15 1203  BP: 122/74  Pulse: 66     Body mass index is 30.9 kg/(m^2).    ECOG FS:0 - Asymptomatic  PHYSICAL EXAM: GENERAL:  Well developed, well nourished, sitting comfortably in the exam room in no acute distress.  She is slightly apprehensive and somewhat depressed MENTAL STATUS:  Alert and  oriented to person, place and time. HEAD:    Normocephalic, atraumatic, face symmetric, no Cushingoid features. EYES:  Pupils equal round and reactive to light and accomodation.  No conjunctivitis or scleral icterus. ENT:  Oropharynx clear without lesion.  Tongue normal. Mucous membranes moist.  RESPIRATORY:  Clear to auscultation without rales, wheezes or rhonchi. CARDIOVASCULAR:  Regular rate and rhythm without murmur, rub or gallop.  ABDOMEN:  Soft, non-tender, with active bowel sounds, and no hepatosplenomegaly.  No masses. Abdominal wound is healing well. BACK:  No CVA tenderness.  No tenderness on percussion of the back or rib cage. SKIN:  No rashes, ulcers or lesions. EXTREMITIES: No edema, no skin discoloration or tenderness.  No palpable cords. LYMPH NODES: No palpable cervical, supraclavicular, axillary or inguinal adenopathy  NEUROLOGICAL: Unremarkable. PSYCH:  Appropriate. port site is within normal limit  LAB RESULTS:  Appointment on 01/03/2015  Component Date Value  Ref Range Status  . WBC 01/03/2015 8.4  3.6 - 11.0 K/uL Final  . RBC 01/03/2015 4.62  3.80 - 5.20 MIL/uL Final  . Hemoglobin 01/03/2015 13.9  12.0 - 16.0 g/dL Final  . HCT 01/03/2015 42.5  35.0 - 47.0 % Final  . MCV 01/03/2015 91.9  80.0 - 100.0 fL Final  . MCH 01/03/2015 30.1  26.0 - 34.0 pg Final  . MCHC 01/03/2015 32.7  32.0 - 36.0 g/dL Final  . RDW 01/03/2015 13.1  11.5 - 14.5 % Final  . Platelets 01/03/2015 325  150 - 440 K/uL Final  . Neutrophils Relative % 01/03/2015 53   Final  . Neutro Abs 01/03/2015 4.5  1.4 - 6.5 K/uL Final  . Lymphocytes Relative 01/03/2015 27   Final  . Lymphs Abs 01/03/2015 2.3  1.0 - 3.6 K/uL Final  . Monocytes Relative 01/03/2015 7   Final  . Monocytes Absolute 01/03/2015 0.6  0.2 - 0.9 K/uL Final  . Eosinophils Relative 01/03/2015 12   Final  . Eosinophils Absolute 01/03/2015 1.0* 0 - 0.7 K/uL Final  . Basophils Relative 01/03/2015 1   Final  . Basophils Absolute  01/03/2015 0.0  0 - 0.1 K/uL Final  . Sodium 01/03/2015 137  135 - 145 mmol/L Final  . Potassium 01/03/2015 4.2  3.5 - 5.1 mmol/L Final  . Chloride 01/03/2015 105  101 - 111 mmol/L Final  . CO2 01/03/2015 29  22 - 32 mmol/L Final  . Glucose, Bld 01/03/2015 91  65 - 99 mg/dL Final  . BUN 01/03/2015 14  6 - 20 mg/dL Final  . Creatinine, Ser 01/03/2015 0.96  0.44 - 1.00 mg/dL Final  . Calcium 01/03/2015 9.2  8.9 - 10.3 mg/dL Final  . Total Protein 01/03/2015 7.0  6.5 - 8.1 g/dL Final  . Albumin 01/03/2015 3.5  3.5 - 5.0 g/dL Final  . AST 01/03/2015 15  15 - 41 U/L Final  . ALT 01/03/2015 15  14 - 54 U/L Final  . Alkaline Phosphatase 01/03/2015 87  38 - 126 U/L Final  . Total Bilirubin 01/03/2015 0.3  0.3 - 1.2 mg/dL Final  . GFR calc non Af Amer 01/03/2015 58* >60 mL/min Final  . GFR calc Af Amer 01/03/2015 >60  >60 mL/min Final   Comment: (NOTE) The eGFR has been calculated using the CKD EPI equation. This calculation has not been validated in all clinical situations. eGFR's persistently <60 mL/min signify possible Chronic Kidney Disease.   . Anion gap 01/03/2015 3* 5 - 15 Final    No results found for: LABCA2 No results found for: CA199 No results found for: CEA No results found for: PSA Lab Results  Component Value Date   CA125 2354.0* 12/08/2014     STUDIES: Dg Chest 2 View  12/14/2014   CLINICAL DATA:  Ovarian cancer.  EXAM: CHEST  2 VIEW  COMPARISON:  10/04/2009 .  FINDINGS: Mediastinum and hilar structures are normal. Lungs are clear. Heart size normal. No pleural effusion or pneumothorax. No acute bony abnormality identified. Interposition of the colon under the right hemidiaphragm noted.  IMPRESSION: No acute cardiopulmonary disease.   Electronically Signed   By: Marcello Moores  Register   On: 12/14/2014 12:53   Portable Chest 1 View  12/22/2014   CLINICAL DATA:  Port-A-Cath placement today.  EXAM: PORTABLE CHEST - 1 VIEW  COMPARISON:  PA and lateral chest 12/14/2014.  FINDINGS:  Left subclavian approach Port-A-Cath is in place with the tip projecting over the lower  superior vena cava. Tubing is intact. There is no pneumothorax. Lungs are clear. Heart size is normal.  IMPRESSION: Port-A-Cath projects in good position. Negative for pneumothorax or acute abnormality.   Electronically Signed   By: Inge Rise M.D.   On: 12/22/2014 17:36   Dg C-arm 1-60 Min-no Report  12/23/2014   : Fluoroscopy was utilized by the requesting physician. No radiographic interpretation.   Electronically Signed   By: Porfirio Mylar   On: 12/23/2014 12:12    ASSESSMENT: Carcinoma of ovary stage III disease status post bilateral salpingo-oophorectomy and omentectomy with optimal debulking surgery Previous history of carcinoma of breast status post lumpectomy radiation and 5 years of tamoxifen  MEDICAL DECISION MAKING:  I had prolonged discussion with patient regarding adjuvant chemotherapy.  Patient will attend chemotherapy class tomorrow possibility of starting chemotherapy next week Carboplatinum Taxol and Taxol be given in dose dense fashion. All the questions were answered. BRCA mutation studies pending  Patient expressed understanding and was in agreement with this plan. She also understands that She can call clinic at any time with any questions, concerns, or complaints.    Ovarian cancer   Staging form: Ovary, AJCC 7th Edition     Clinical: Stage IIIC (T3c, N0, M0) - Unsigned   Forest Gleason, MD   01/04/2015 8:55 AM

## 2015-01-05 DIAGNOSIS — N135 Crossing vessel and stricture of ureter without hydronephrosis: Secondary | ICD-10-CM | POA: Insufficient documentation

## 2015-01-07 ENCOUNTER — Other Ambulatory Visit: Payer: Self-pay | Admitting: *Deleted

## 2015-01-07 DIAGNOSIS — C562 Malignant neoplasm of left ovary: Secondary | ICD-10-CM

## 2015-01-12 ENCOUNTER — Inpatient Hospital Stay: Payer: PPO

## 2015-01-12 ENCOUNTER — Inpatient Hospital Stay (HOSPITAL_BASED_OUTPATIENT_CLINIC_OR_DEPARTMENT_OTHER): Payer: PPO | Admitting: Oncology

## 2015-01-12 ENCOUNTER — Encounter: Payer: Self-pay | Admitting: Oncology

## 2015-01-12 VITALS — BP 137/74 | HR 70 | Temp 97.8°F | Wt 175.0 lb

## 2015-01-12 VITALS — BP 112/71 | HR 67

## 2015-01-12 DIAGNOSIS — Z90722 Acquired absence of ovaries, bilateral: Secondary | ICD-10-CM

## 2015-01-12 DIAGNOSIS — I480 Paroxysmal atrial fibrillation: Secondary | ICD-10-CM

## 2015-01-12 DIAGNOSIS — R1030 Lower abdominal pain, unspecified: Secondary | ICD-10-CM

## 2015-01-12 DIAGNOSIS — C561 Malignant neoplasm of right ovary: Secondary | ICD-10-CM

## 2015-01-12 DIAGNOSIS — Z853 Personal history of malignant neoplasm of breast: Secondary | ICD-10-CM

## 2015-01-12 DIAGNOSIS — Z79899 Other long term (current) drug therapy: Secondary | ICD-10-CM

## 2015-01-12 DIAGNOSIS — C569 Malignant neoplasm of unspecified ovary: Secondary | ICD-10-CM

## 2015-01-12 DIAGNOSIS — Z923 Personal history of irradiation: Secondary | ICD-10-CM

## 2015-01-12 DIAGNOSIS — I340 Nonrheumatic mitral (valve) insufficiency: Secondary | ICD-10-CM

## 2015-01-12 DIAGNOSIS — Z9889 Other specified postprocedural states: Secondary | ICD-10-CM | POA: Diagnosis not present

## 2015-01-12 DIAGNOSIS — E785 Hyperlipidemia, unspecified: Secondary | ICD-10-CM

## 2015-01-12 DIAGNOSIS — Z9223 Personal history of estrogen therapy: Secondary | ICD-10-CM

## 2015-01-12 DIAGNOSIS — E039 Hypothyroidism, unspecified: Secondary | ICD-10-CM

## 2015-01-12 DIAGNOSIS — C562 Malignant neoplasm of left ovary: Secondary | ICD-10-CM

## 2015-01-12 LAB — COMPREHENSIVE METABOLIC PANEL
ALBUMIN: 3.6 g/dL (ref 3.5–5.0)
ALT: 15 U/L (ref 14–54)
AST: 16 U/L (ref 15–41)
Alkaline Phosphatase: 77 U/L (ref 38–126)
Anion gap: 3 — ABNORMAL LOW (ref 5–15)
BILIRUBIN TOTAL: 0.5 mg/dL (ref 0.3–1.2)
BUN: 15 mg/dL (ref 6–20)
CO2: 26 mmol/L (ref 22–32)
Calcium: 8.9 mg/dL (ref 8.9–10.3)
Chloride: 106 mmol/L (ref 101–111)
Creatinine, Ser: 0.89 mg/dL (ref 0.44–1.00)
GLUCOSE: 88 mg/dL (ref 65–99)
Potassium: 3.7 mmol/L (ref 3.5–5.1)
SODIUM: 135 mmol/L (ref 135–145)
Total Protein: 6.8 g/dL (ref 6.5–8.1)

## 2015-01-12 LAB — CBC WITH DIFFERENTIAL/PLATELET
BASOS ABS: 0 10*3/uL (ref 0–0.1)
Basophils Relative: 0 %
Eosinophils Absolute: 0.7 10*3/uL (ref 0–0.7)
Eosinophils Relative: 12 %
HEMATOCRIT: 41 % (ref 35.0–47.0)
Hemoglobin: 13.7 g/dL (ref 12.0–16.0)
Lymphocytes Relative: 39 %
Lymphs Abs: 2.5 10*3/uL (ref 1.0–3.6)
MCH: 30.2 pg (ref 26.0–34.0)
MCHC: 33.4 g/dL (ref 32.0–36.0)
MCV: 90.5 fL (ref 80.0–100.0)
MONO ABS: 0.7 10*3/uL (ref 0.2–0.9)
Monocytes Relative: 11 %
NEUTROS PCT: 38 %
Neutro Abs: 2.4 10*3/uL (ref 1.4–6.5)
Platelets: 261 10*3/uL (ref 150–440)
RBC: 4.53 MIL/uL (ref 3.80–5.20)
RDW: 12.9 % (ref 11.5–14.5)
WBC: 6.3 10*3/uL (ref 3.6–11.0)

## 2015-01-12 MED ORDER — CARBOPLATIN CHEMO INJECTION 600 MG/60ML
540.0000 mg | Freq: Once | INTRAVENOUS | Status: AC
  Administered 2015-01-12: 540 mg via INTRAVENOUS
  Filled 2015-01-12: qty 54

## 2015-01-12 MED ORDER — HEPARIN SOD (PORK) LOCK FLUSH 100 UNIT/ML IV SOLN
500.0000 [IU] | Freq: Once | INTRAVENOUS | Status: AC
  Administered 2015-01-12: 500 [IU] via INTRAVENOUS
  Filled 2015-01-12: qty 5

## 2015-01-12 MED ORDER — PACLITAXEL CHEMO INJECTION 300 MG/50ML
80.0000 mg/m2 | Freq: Once | INTRAVENOUS | Status: AC
  Administered 2015-01-12: 150 mg via INTRAVENOUS
  Filled 2015-01-12: qty 25

## 2015-01-12 MED ORDER — DIPHENHYDRAMINE HCL 50 MG/ML IJ SOLN
50.0000 mg | Freq: Once | INTRAMUSCULAR | Status: AC
  Administered 2015-01-12: 50 mg via INTRAVENOUS
  Filled 2015-01-12: qty 1

## 2015-01-12 MED ORDER — LORAZEPAM 0.5 MG PO TABS: 0.5000 mg | ORAL_TABLET | Freq: Every day | ORAL | Status: DC

## 2015-01-12 MED ORDER — SODIUM CHLORIDE 0.9 % IV SOLN
Freq: Once | INTRAVENOUS | Status: AC
  Administered 2015-01-12: 11:00:00 via INTRAVENOUS
  Filled 2015-01-12: qty 5

## 2015-01-12 MED ORDER — SODIUM CHLORIDE 0.9 % IV SOLN
Freq: Once | INTRAVENOUS | Status: AC
  Administered 2015-01-12: 10:00:00 via INTRAVENOUS
  Filled 2015-01-12: qty 250

## 2015-01-12 MED ORDER — FAMOTIDINE IN NACL 20-0.9 MG/50ML-% IV SOLN
20.0000 mg | Freq: Once | INTRAVENOUS | Status: AC
Start: 2015-01-12 — End: 2015-01-12
  Administered 2015-01-12: 20 mg via INTRAVENOUS
  Filled 2015-01-12: qty 50

## 2015-01-12 MED ORDER — SODIUM CHLORIDE 0.9 % IJ SOLN
10.0000 mL | INTRAMUSCULAR | Status: DC | PRN
  Administered 2015-01-12: 10 mL via INTRAVENOUS
  Filled 2015-01-12: qty 10

## 2015-01-12 MED ORDER — PALONOSETRON HCL INJECTION 0.25 MG/5ML
0.2500 mg | Freq: Once | INTRAVENOUS | Status: AC
  Administered 2015-01-12: 0.25 mg via INTRAVENOUS
  Filled 2015-01-12: qty 5

## 2015-01-12 MED ORDER — ONDANSETRON HCL 4 MG PO TABS: 4.0000 mg | ORAL_TABLET | Freq: Four times a day (QID) | ORAL | Status: DC | PRN

## 2015-01-12 MED ORDER — HEPARIN SOD (PORK) LOCK FLUSH 100 UNIT/ML IV SOLN: 500.0000 [IU] | Freq: Once | INTRAVENOUS | Status: DC | PRN

## 2015-01-14 ENCOUNTER — Telehealth: Payer: Self-pay | Admitting: *Deleted

## 2015-01-14 NOTE — Telephone Encounter (Signed)
Took Miralax times 2 days and finally had BM today asking if she should continue taking Miralax daily and we discussed miralax daily and colace daily and is that doesn't help she can take up to 2 colace twice a day. SHew said she will try this and call back if needed

## 2015-01-17 ENCOUNTER — Encounter: Payer: Self-pay | Admitting: Oncology

## 2015-01-17 NOTE — Progress Notes (Signed)
[place] @ [facility] [phone]  Fax:([phone] Tate: June 15, 1941  MR#: [id number]  NTI#:[id number]  Patient Care Team: Jerrol Banana., MD as PCP - General (Family Medicine) [facility] Gaetana Michaelis, MD as Referring Physician (Obstetrics and Gynecology) Robert Bellow, MD as Consulting Physician (General Surgery)  CHIEF COMPLAINT:  Chief Complaint  Patient presents with  . Follow-up    Ovarian Cancer  . 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 2.   carcinoma  of breast ( left) status post lumpectomy ,adiation  therapyand 5 years of tamoxifen. 3.history of atrial fibrillation being managed by cardiologist 4.  Status post bilateral salpingo-oophorectomy on fourth of May, 2016.  with optimal debulking surgery 5.  Starting chemotherapy with carboplatinum and Taxol in dose dense fashion from Jan 02, 2015     Ovarian cancer   12/16/2014 Initial Diagnosis Ovarian cancer    Oncology Flowsheet 12/22/2014 12/23/2014 12/24/2014 12/25/2014 12/26/2014 01/12/2015  Day, Cycle - - - - - Day 1, Cycle 1  CARBOplatin (PARAPLATIN) IV - - - - - 540 mg  dexamethasone (DECADRON) IJ - - - - - -  dexamethasone (DECADRON) IV - - - - - [ 12 mg ]  enoxaparin (LOVENOX) [place] - 40 mg 40 mg 40 mg 40 mg -  fosaprepitant (EMEND) IV - - - - - [ 150 mg ]  ondansetron (ZOFRAN) IV - - - - - -  PACLitaxel (TAXOL) IV - - - - - 80 mg/m2  palonosetron (ALOXI) IV - - - - - 0.25 mg  scopolamine 1 MG/3DAYS (TRANSDERM-SCOP) TD - - - - - -    INTERVAL HISTORY: 74 year old lady with a history of carcinoma of ovary status post bilateral salpingo-oophorectomy on May 4.  Patient is here for ongoing evaluation gradually recovering from surgery had a port placement.  Appetite has been improving.  No nausea.  No vomiting.  Somewhat  depressed and anxious here for evaluation Jan 12, 2015 Patient is here to initiate first cycle of chemotherapy.  The patient has number of questions regarding treatment.  Starting chemotherapy as an adjuvant treatment in a dose dense Taxol fashion  REVIEW OF SYSTEMS:   GENERAL:  Feels good.  Active.  No fevers, sweats or weight loss. PERFORMANCE STATUS (ECOG):  0 HEENT:  No visual changes, runny nose, sore throat, mouth sores or tenderness. Lungs: No shortness of breath or cough.  No hemoptysis. Cardiac:  No chest pain, palpitations, orthopnea, or PND. GI:  abdominal discomfort.  Abdominal pain in lower abdominal area GU:  No urgency, frequency, dysuria, or hematuria. Musculoskeletal:  No back pain.  No joint pain.  No muscle tenderness. Extremities:  No pain or swelling. Skin:  No rashes or skin changes. Neuro:  No headache, numbness or weakness, balance or coordination issues. Endocrine:  No diabetes, thyroid issues, hot flashes or night sweats. Psych:  No mood changes, depression or anxiety. Pain:  No focal pain. Review of systems:  All other systems reviewed and found to be negative.  As per HPI. Otherwise, a complete review of systems is negatve.  PAST MEDICAL HISTORY: Past Medical History  Diagnosis Date  . Atrial fibrillation   . Hyperlipidemia   . PVC (premature ventricular contraction)   . Mitral insufficiency   . GERD (gastroesophageal reflux disease)   . Cancer  of breast     Left  . Hypothyroidism     PAST SURGICAL HISTORY: Past Surgical History  Procedure Laterality Date  . Cesarean section      x2  . Abdominal hysterectomy    . Cholecystectomy    . Colonoscopy  2006  . Breast surgery      Left  . Laparotomy with staging N/A 12/22/2014    Procedure: LAPAROTOMY WITH STAGING;  Surgeon: Gillis Ends, MD;  Location: [facility] OR[facility];  Service: Gynecology;  Laterality: N/A;  . Salpingoophorectomy Bilateral 12/22/2014    Procedure: SALPINGO OOPHORECTOMY;  Surgeon:  Gillis Ends, MD;  Location: [facility];  Service: Gynecology;  Laterality: Bilateral;  . Debulking N/A 12/22/2014    Procedure: DEBULKING;  Surgeon: Gillis Ends, MD;  Location: [facility] OR[facility];  Service: Gynecology;  Laterality: N/A;  . Laparotomy N/A 12/22/2014    Procedure: EXPLORATORY LAPAROTOMY;  Surgeon: Robert Bellow, MD;  Location: [facility] OR[facility];  Service: General;  Laterality: N/A;  . Portacath placement Right 12/22/2014    Procedure: INSERTION PORT-A-CATH;  Surgeon: Robert Bellow, MD;  Location: [facility];  Service: General;  Laterality: Right;  . Peripheral vascular catheterization Left 12/22/2014    Procedure: PORTA CATH INSERTION;  Surgeon: Robert Bellow, MD;  Location: [facility];  Service: General;  Laterality: Left;    FAMILY HISTORY History reviewed. No pertinent family history.  GYNECOLOGIC HISTORY:  No LMP recorded. Patient is postmenopausal.     ADVANCED DIRECTIVES: Patient does not have any advanced healthcare directive. Information has been given.   HEALTH MAINTENANCE: History  Substance Use Topics  . Smoking status: Never Smoker   . Smokeless tobacco: Never Used  . Alcohol Use: No      Allergies  Allergen Reactions  . Carafate [Sucralfate] Rash  . Sulfa Antibiotics Rash    Current Outpatient Prescriptions  Medication Sig Dispense Refill  . acetaminophen (TYLENOL) 500 MG chewable tablet Chew 500 mg by mouth every 6 (six) hours as needed for pain.    Marland Kitchen docusate sodium (COLACE) 50 MG capsule Take 50 mg by mouth 2 (two) times daily.    Marland Kitchen levothyroxine (SYNTHROID, LEVOTHROID) 50 MCG tablet Take 50 mcg by mouth daily before breakfast.    . lidocaine-prilocaine (EMLA) cream Apply 1 application topically as needed. 30 g 0  . pantoprazole (PROTONIX) 40 MG tablet Take 40 mg by mouth daily.    . polyethylene glycol (MIRALAX / GLYCOLAX) packet Take 17 g by mouth daily.    . propafenone (RYTHMOL) 225 MG tablet Take 225 mg by mouth 2 (two) times daily.      Marland Kitchen aspirin EC 81 MG tablet Take 81 mg by mouth daily.    Marland Kitchen enoxaparin (LOVENOX) 40 MG/0.4ML injection Inject 0.4 mLs (40 mg total) into the skin daily. 28 mL 0  . LORazepam (ATIVAN) 0.5 MG tablet Take 1 tablet (0.5 mg total) by mouth at bedtime. 30 tablet 3  . ondansetron (ZOFRAN) 4 MG tablet Take 1 tablet (4 mg total) by mouth every 6 (six) hours as needed for nausea or vomiting. 30 tablet 3   No current facility-administered medications for this visit.    OBJECTIVE:  Filed Vitals:   01/12/15 0916  BP: 137/74  Pulse: 70  Temp: 97.8 F (36.6 C)     Body mass index is 31.02 kg/(m^2).    ECOG FS:0 - Asymptomatic  PHYSICAL EXAM: GENERAL:  Well developed, well nourished, sitting comfortably in the exam room in no acute  distress.  She is slightly apprehensive and somewhat depressed MENTAL STATUS:  Alert and oriented to person, place and time. HEAD:    Normocephalic, atraumatic, face symmetric, no Cushingoid features. EYES:  Pupils equal round and reactive to light and accomodation.  No conjunctivitis or scleral icterus. ENT:  Oropharynx clear without lesion.  Tongue normal. Mucous membranes moist.  RESPIRATORY:  Clear to auscultation without rales, wheezes or rhonchi. CARDIOVASCULAR:  Regular rate and rhythm without murmur, rub or gallop.  ABDOMEN:  Soft, non-tender, with active bowel sounds, and no hepatosplenomegaly.  No masses. Abdominal wound is healing well. BACK:  No CVA tenderness.  No tenderness on percussion of the back or rib cage. SKIN:  No rashes, ulcers or lesions. EXTREMITIES: No edema, no skin discoloration or tenderness.  No palpable cords. LYMPH NODES: No palpable cervical, supraclavicular, axillary or inguinal adenopathy  NEUROLOGICAL: Unremarkable. PSYCH:  Appropriate. port site is within normal limit  LAB RESULTS:  Infusion on 01/12/2015  Component Date Value Ref Range Status  . WBC 01/12/2015 6.3  3.6 - 11.0 K/uL Final   A-LINE DRAW  . RBC 01/12/2015 4.53   3.80 - 5.20 MIL/uL Final  . Hemoglobin 01/12/2015 13.7  12.0 - 16.0 g/dL Final  . HCT 01/12/2015 41.0  35.0 - 47.0 % Final  . MCV 01/12/2015 90.5  80.0 - 100.0 fL Final  . MCH 01/12/2015 30.2  26.0 - 34.0 pg Final  . MCHC 01/12/2015 33.4  32.0 - 36.0 g/dL Final  . RDW 01/12/2015 12.9  11.5 - 14.5 % Final  . Platelets 01/12/2015 261  150 - 440 K/uL Final  . Neutrophils Relative % 01/12/2015 38   Final  . Neutro Abs 01/12/2015 2.4  1.4 - 6.5 K/uL Final  . Lymphocytes Relative 01/12/2015 39   Final  . Lymphs Abs 01/12/2015 2.5  1.0 - 3.6 K/uL Final  . Monocytes Relative 01/12/2015 11   Final  . Monocytes Absolute 01/12/2015 0.7  0.2 - 0.9 K/uL Final  . Eosinophils Relative 01/12/2015 12   Final  . Eosinophils Absolute 01/12/2015 0.7  0 - 0.7 K/uL Final  . Basophils Relative 01/12/2015 0   Final  . Basophils Absolute 01/12/2015 0.0  0 - 0.1 K/uL Final  . Sodium 01/12/2015 135  135 - 145 mmol/L Final  . Potassium 01/12/2015 3.7  3.5 - 5.1 mmol/L Final  . Chloride 01/12/2015 106  101 - 111 mmol/L Final  . CO2 01/12/2015 26  22 - 32 mmol/L Final  . Glucose, Bld 01/12/2015 88  65 - 99 mg/dL Final  . BUN 01/12/2015 15  6 - 20 mg/dL Final  . Creatinine, Ser 01/12/2015 0.89  0.44 - 1.00 mg/dL Final  . Calcium 01/12/2015 8.9  8.9 - 10.3 mg/dL Final  . Total Protein 01/12/2015 6.8  6.5 - 8.1 g/dL Final  . Albumin 01/12/2015 3.6  3.5 - 5.0 g/dL Final  . AST 01/12/2015 16  15 - 41 U/L Final  . ALT 01/12/2015 15  14 - 54 U/L Final  . Alkaline Phosphatase 01/12/2015 77  38 - 126 U/L Final  . Total Bilirubin 01/12/2015 0.5  0.3 - 1.2 mg/dL Final  . GFR calc non Af Amer 01/12/2015 >60  >60 mL/min Final  . GFR calc Af Amer 01/12/2015 >60  >60 mL/min Final   Comment: (NOTE) The eGFR has been calculated using the CKD EPI equation. This calculation has not been validated in all clinical situations. eGFR's persistently <60 mL/min signify possible Chronic Kidney Disease.   Marland Kitchen  Anion gap 01/12/2015 3* 5  - 15 Final    No results found for: LABCA2 No results found for: CA199 No results found for: CEA No results found for: PSA Lab Results  Component Value Date   CA125 2354.0* 12/08/2014     STUDIES: Portable Chest 1 View  12/22/2014   CLINICAL DATA:  Port-A-Cath placement today.  EXAM: PORTABLE CHEST - 1 VIEW  COMPARISON:  PA and lateral chest 12/14/2014.  FINDINGS: Left subclavian approach Port-A-Cath is in place with the tip projecting over the lower superior vena cava. Tubing is intact. There is no pneumothorax. Lungs are clear. Heart size is normal.  IMPRESSION: Port-A-Cath projects in good position. Negative for pneumothorax or acute abnormality.   Electronically Signed   By: Inge Rise M.D.   On: 12/22/2014 17:36   Dg C-arm 1-60 Min-no Report  12/23/2014   : Fluoroscopy was utilized by the requesting physician. No radiographic interpretation.   Electronically Signed   By: Porfirio Mylar   On: 12/23/2014 12:12    ASSESSMENT: Carcinoma of ovary stage III disease status post bilateral salpingo-oophorectomy and omentectomy with optimal debulking surgery. Starting adjuvant chemotherapy Patient is starting chemotherapy with carboplatinum and Taxol given in dose dense fashion. Consent has been obtained. Previous history of carcinoma of breast status post lumpectomy radiation and 5 years of tamoxifen  MEDICAL DECISION MAKING:  I had prolonged discussion with patient regarding adjuvant chemotherapy.  Patient will attend chemotherapy class tomorrow possibility of starting chemotherapy next week Carboplatinum Taxol and Taxol be given in dose dense fashion. All the questions were answered. BRCA mutation studies pending.  Total duration of visit was 45 minutes.  50% or more time was spent in counseling patient and family regarding prognosis and options of treatment and available resources  Patient expressed understanding and was in agreement with this plan. She also understands that She  can call clinic at any time with any questions, concerns, or complaints.    Ovarian cancer   Staging form: Ovary, AJCC 7th Edition     Clinical: Stage IIIC (T3c, N0, M0) - Unsigned   Forest Gleason, MD   01/17/2015 8:52 AM

## 2015-01-19 ENCOUNTER — Inpatient Hospital Stay: Payer: PPO

## 2015-01-19 ENCOUNTER — Encounter (INDEPENDENT_AMBULATORY_CARE_PROVIDER_SITE_OTHER): Payer: Self-pay

## 2015-01-19 ENCOUNTER — Encounter: Payer: Self-pay | Admitting: Oncology

## 2015-01-19 ENCOUNTER — Inpatient Hospital Stay: Payer: PPO | Attending: Oncology

## 2015-01-19 ENCOUNTER — Inpatient Hospital Stay (HOSPITAL_BASED_OUTPATIENT_CLINIC_OR_DEPARTMENT_OTHER): Payer: PPO | Admitting: Oncology

## 2015-01-19 VITALS — BP 128/76 | HR 83 | Temp 96.2°F | Wt 172.4 lb

## 2015-01-19 VITALS — BP 108/74 | HR 62 | Resp 20

## 2015-01-19 DIAGNOSIS — Z9889 Other specified postprocedural states: Secondary | ICD-10-CM | POA: Diagnosis not present

## 2015-01-19 DIAGNOSIS — K5900 Constipation, unspecified: Secondary | ICD-10-CM | POA: Diagnosis not present

## 2015-01-19 DIAGNOSIS — Z90722 Acquired absence of ovaries, bilateral: Secondary | ICD-10-CM | POA: Diagnosis not present

## 2015-01-19 DIAGNOSIS — Z923 Personal history of irradiation: Secondary | ICD-10-CM | POA: Insufficient documentation

## 2015-01-19 DIAGNOSIS — C569 Malignant neoplasm of unspecified ovary: Secondary | ICD-10-CM

## 2015-01-19 DIAGNOSIS — Z79899 Other long term (current) drug therapy: Secondary | ICD-10-CM

## 2015-01-19 DIAGNOSIS — E785 Hyperlipidemia, unspecified: Secondary | ICD-10-CM | POA: Diagnosis not present

## 2015-01-19 DIAGNOSIS — E039 Hypothyroidism, unspecified: Secondary | ICD-10-CM

## 2015-01-19 DIAGNOSIS — Z7982 Long term (current) use of aspirin: Secondary | ICD-10-CM | POA: Insufficient documentation

## 2015-01-19 DIAGNOSIS — Z9223 Personal history of estrogen therapy: Secondary | ICD-10-CM | POA: Insufficient documentation

## 2015-01-19 DIAGNOSIS — I493 Ventricular premature depolarization: Secondary | ICD-10-CM | POA: Insufficient documentation

## 2015-01-19 DIAGNOSIS — Z5111 Encounter for antineoplastic chemotherapy: Secondary | ICD-10-CM | POA: Insufficient documentation

## 2015-01-19 DIAGNOSIS — C561 Malignant neoplasm of right ovary: Secondary | ICD-10-CM

## 2015-01-19 DIAGNOSIS — I4891 Unspecified atrial fibrillation: Secondary | ICD-10-CM

## 2015-01-19 DIAGNOSIS — R110 Nausea: Secondary | ICD-10-CM | POA: Insufficient documentation

## 2015-01-19 DIAGNOSIS — Z853 Personal history of malignant neoplasm of breast: Secondary | ICD-10-CM | POA: Diagnosis not present

## 2015-01-19 DIAGNOSIS — I340 Nonrheumatic mitral (valve) insufficiency: Secondary | ICD-10-CM

## 2015-01-19 DIAGNOSIS — F418 Other specified anxiety disorders: Secondary | ICD-10-CM | POA: Diagnosis not present

## 2015-01-19 DIAGNOSIS — Z90710 Acquired absence of both cervix and uterus: Secondary | ICD-10-CM | POA: Insufficient documentation

## 2015-01-19 DIAGNOSIS — M79669 Pain in unspecified lower leg: Secondary | ICD-10-CM | POA: Diagnosis not present

## 2015-01-19 DIAGNOSIS — C562 Malignant neoplasm of left ovary: Secondary | ICD-10-CM

## 2015-01-19 DIAGNOSIS — K219 Gastro-esophageal reflux disease without esophagitis: Secondary | ICD-10-CM | POA: Insufficient documentation

## 2015-01-19 LAB — CBC WITH DIFFERENTIAL/PLATELET
Basophils Absolute: 0 10*3/uL (ref 0–0.1)
Basophils Relative: 1 %
EOS ABS: 0.1 10*3/uL (ref 0–0.7)
Eosinophils Relative: 2 %
HCT: 41.3 % (ref 35.0–47.0)
Hemoglobin: 13.5 g/dL (ref 12.0–16.0)
Lymphocytes Relative: 55 %
Lymphs Abs: 2.5 10*3/uL (ref 1.0–3.6)
MCH: 29.8 pg (ref 26.0–34.0)
MCHC: 32.7 g/dL (ref 32.0–36.0)
MCV: 91.1 fL (ref 80.0–100.0)
Monocytes Absolute: 0.3 10*3/uL (ref 0.2–0.9)
Monocytes Relative: 7 %
NEUTROS ABS: 1.6 10*3/uL (ref 1.4–6.5)
Neutrophils Relative %: 35 %
Platelets: 201 10*3/uL (ref 150–440)
RBC: 4.53 MIL/uL (ref 3.80–5.20)
RDW: 12.8 % (ref 11.5–14.5)
WBC: 4.6 10*3/uL (ref 3.6–11.0)

## 2015-01-19 LAB — COMPREHENSIVE METABOLIC PANEL
ALK PHOS: 76 U/L (ref 38–126)
ALT: 21 U/L (ref 14–54)
ANION GAP: 5 (ref 5–15)
AST: 19 U/L (ref 15–41)
Albumin: 3.6 g/dL (ref 3.5–5.0)
BILIRUBIN TOTAL: 0.4 mg/dL (ref 0.3–1.2)
BUN: 19 mg/dL (ref 6–20)
CALCIUM: 8.7 mg/dL — AB (ref 8.9–10.3)
CO2: 25 mmol/L (ref 22–32)
Chloride: 102 mmol/L (ref 101–111)
Creatinine, Ser: 1 mg/dL (ref 0.44–1.00)
GFR calc Af Amer: >60 mL/min (ref >60)
GFR, EST NON AFRICAN AMERICAN: 55 mL/min — AB (ref >60)
GLUCOSE: 119 mg/dL — AB (ref 65–99)
Potassium: 3.8 mmol/L (ref 3.5–5.1)
SODIUM: 132 mmol/L — AB (ref 135–145)
TOTAL PROTEIN: 6.6 g/dL (ref 6.5–8.1)

## 2015-01-19 MED ORDER — SODIUM CHLORIDE 0.9 % IV SOLN
Freq: Once | INTRAVENOUS | Status: AC
  Administered 2015-01-19: 15:00:00 via INTRAVENOUS
  Filled 2015-01-19: qty 1000

## 2015-01-19 MED ORDER — DEXTROSE 5 % IV SOLN
80.0000 mg/m2 | Freq: Once | INTRAVENOUS | Status: AC
  Administered 2015-01-19: 150 mg via INTRAVENOUS
  Filled 2015-01-19: qty 25

## 2015-01-19 MED ORDER — DIPHENHYDRAMINE HCL 50 MG/ML IJ SOLN
50.0000 mg | Freq: Once | INTRAMUSCULAR | Status: AC
  Administered 2015-01-19: 50 mg via INTRAVENOUS
  Filled 2015-01-19: qty 1

## 2015-01-19 MED ORDER — HEPARIN SOD (PORK) LOCK FLUSH 100 UNIT/ML IV SOLN
INTRAVENOUS | Status: AC
  Filled 2015-01-19: qty 5

## 2015-01-19 MED ORDER — FAMOTIDINE IN NACL 20-0.9 MG/50ML-% IV SOLN
20.0000 mg | Freq: Once | INTRAVENOUS | Status: AC
  Administered 2015-01-19: 20 mg via INTRAVENOUS
  Filled 2015-01-19: qty 50

## 2015-01-19 NOTE — Progress Notes (Signed)
Patient never smoked. Pt does have living will.

## 2015-01-19 NOTE — Progress Notes (Signed)
[place] @ [facility] [phone]  Fax:([phone]) [place]: May 25, 1941  MR#: [id number]  RDE#:[id number]  Patient Care Team: Jerrol Banana., MD as PCP - General (Family Medicine) [facility] Gaetana Michaelis, MD as Referring Physician (Obstetrics and Gynecology) Robert Bellow, MD as Consulting Physician (General Surgery)  CHIEF COMPLAINT:  Chief Complaint  Patient presents with  . 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 2.   carcinoma  of breast ( left) status post lumpectomy ,adiation  therapyand 5 years of tamoxifen. 3.history of atrial fibrillation being managed by cardiologist 4.  Status post bilateral salpingo-oophorectomy on fourth of May, 2016.  with optimal debulking surgery 5.  Starting chemotherapy with carboplatinum and Taxol in dose dense fashion from Jan 02, 2015     Ovarian cancer   12/16/2014 Initial Diagnosis Ovarian cancer    Oncology Flowsheet 12/22/2014 12/23/2014 12/24/2014 12/25/2014 12/26/2014 01/12/2015 01/19/2015  Day, Cycle - - - - - Day 1, Cycle 1 Day 8, Cycle 1  CARBOplatin (PARAPLATIN) IV - - - - - 540 mg -  dexamethasone (DECADRON) IJ - - - - - - -  dexamethasone (DECADRON) IV - - - - - [ 12 mg ] -  enoxaparin (LOVENOX) [place] - 40 mg 40 mg 40 mg 40 mg - -  fosaprepitant (EMEND) IV - - - - - [ 150 mg ] -  ondansetron (ZOFRAN) IV - - - - - - -  PACLitaxel (TAXOL) IV - - - - - 80 mg/m2 80 mg/m2  palonosetron (ALOXI) IV - - - - - 0.25 mg -  scopolamine 1 MG/3DAYS (TRANSDERM-SCOP) TD - - - - - - -    INTERVAL HISTORY: 74 year old lady with a history of carcinoma of ovary status post bilateral salpingo-oophorectomy on May 4.  Patient is here for ongoing evaluation gradually recovering from surgery had a port placement.  Appetite has been improving.  No nausea.  No  vomiting.  Somewhat depressed and anxious here for evaluation Jan 12, 2015 Patient is here to initiate first cycle of chemotherapy.  The patient has number of questions regarding treatment.  Starting chemotherapy as an adjuvant treatment in a dose dense Taxol fashion January 19, 2015 Patient is here today for day 8 chemotherapy with Taxol.  Patient complains of burning in lower extremity which is intermediate and had mild nausea grade 2.  No abdominal discomfort.  Here for further follow-up and treatment consideration no soreness in the mouth.  No dysphagia.  REVIEW OF SYSTEMS:   GENERAL:  Feels good.  Active.  No fevers, sweats or weight loss. PERFORMANCE STATUS (ECOG):  0 HEENT:  No visual changes, runny nose, sore throat, mouth sores or tenderness. Lungs: No shortness of breath or cough.  No hemoptysis. Cardiac:  No chest pain, palpitations, orthopnea, or PND. GI:  abdominal discomfort.  Abdominal pain in lower abdominal area GU:  No urgency, frequency, dysuria, or hematuria. Musculoskeletal:  No back pain.  No joint pain.  No muscle tenderness. Extremities:  No pain or swelling. Skin:  No rashes or skin changes. Neuro:  No headache, numbness or weakness, balance or coordination issues. Endocrine:  No diabetes, thyroid issues, hot flashes or night sweats. Psych:  No mood changes, depression or anxiety. Pain:  No focal pain. Review of systems:  All other systems reviewed and  found to be negative.  As per HPI. Otherwise, a complete review of systems is negatve.  PAST MEDICAL HISTORY: Past Medical History  Diagnosis Date  . Atrial fibrillation   . Hyperlipidemia   . PVC (premature ventricular contraction)   . Mitral insufficiency   . GERD (gastroesophageal reflux disease)   . Cancer of breast     Left  . Hypothyroidism     PAST SURGICAL HISTORY: Past Surgical History  Procedure Laterality Date  . Cesarean section      x2  . Abdominal hysterectomy    . Cholecystectomy    .  Colonoscopy  2006  . Breast surgery      Left  . Laparotomy with staging N/A 12/22/2014    Procedure: LAPAROTOMY WITH STAGING;  Surgeon: Gillis Ends, MD;  Location: [facility] ORS;  Service: Gynecology;  Laterality: N/A;  . Salpingoophorectomy Bilateral 12/22/2014    Procedure: SALPINGO OOPHORECTOMY;  Surgeon: Gillis Ends, MD;  Location: [facility] ORS;  Service: Gynecology;  Laterality: Bilateral;  . Debulking N/A 12/22/2014    Procedure: DEBULKING;  Surgeon: Gillis Ends, MD;  Location: [facility] ORS;  Service: Gynecology;  Laterality: N/A;  . Laparotomy N/A 12/22/2014    Procedure: EXPLORATORY LAPAROTOMY;  Surgeon: Robert Bellow, MD;  Location: [facility] ORS;  Service: General;  Laterality: N/A;  . Portacath placement Right 12/22/2014    Procedure: INSERTION PORT-A-CATH;  Surgeon: Robert Bellow, MD;  Location: [facility] ORS;  Service: General;  Laterality: Right;  . Peripheral vascular catheterization Left 12/22/2014    Procedure: PORTA CATH INSERTION;  Surgeon: Robert Bellow, MD;  Location: [facility] ORS;  Service: General;  Laterality: Left;    FAMILY HISTORY No family history on file.  GYNECOLOGIC HISTORY:  No LMP recorded. Patient is postmenopausal.     ADVANCED DIRECTIVES: Patient does not have any advanced healthcare directive. Information has been given.   HEALTH MAINTENANCE: History  Substance Use Topics  . Smoking status: Never Smoker   . Smokeless tobacco: Never Used  . Alcohol Use: No      Allergies  Allergen Reactions  . Carafate [Sucralfate] Rash  . Sulfa Antibiotics Rash    Current Outpatient Prescriptions  Medication Sig Dispense Refill  . acetaminophen (TYLENOL) 500 MG chewable tablet Chew 500 mg by mouth every 6 (six) hours as needed for pain.    Marland Kitchen docusate sodium (COLACE) 50 MG capsule Take 50 mg by mouth 2 (two) times daily.    Marland Kitchen enoxaparin (LOVENOX) 40 MG/0.4ML injection Inject 0.4 mLs (40 mg total) into the skin daily. 28 mL 0  . levothyroxine  (SYNTHROID, LEVOTHROID) 50 MCG tablet Take 50 mcg by mouth daily before breakfast.    . lidocaine-prilocaine (EMLA) cream Apply 1 application topically as needed. 30 g 0  . LORazepam (ATIVAN) 0.5 MG tablet Take 1 tablet (0.5 mg total) by mouth at bedtime. 30 tablet 3  . ondansetron (ZOFRAN) 4 MG tablet Take 1 tablet (4 mg total) by mouth every 6 (six) hours as needed for nausea or vomiting. 30 tablet 3  . pantoprazole (PROTONIX) 40 MG tablet Take 40 mg by mouth daily.    . polyethylene glycol (MIRALAX / GLYCOLAX) packet Take 17 g by mouth daily.    . propafenone (RYTHMOL) 225 MG tablet Take 225 mg by mouth 2 (two) times daily.    Marland Kitchen aspirin EC 81 MG tablet Take 81 mg by mouth daily.     No current facility-administered medications for this visit.   Facility-Administered  Medications Ordered in Other Visits  Medication Dose Route Frequency Provider Last Rate Last Dose  . PACLitaxel (TAXOL) 150 mg in dextrose 5 % 250 mL chemo infusion (</= 67m/m2)  80 mg/m2 (Treatment Plan Actual) Intravenous Once JForest Gleason MD   Stopped at 01/19/15 1631    OBJECTIVE:  Filed Vitals:   01/19/15 1352  BP: 128/76  Pulse: 83  Temp: 96.2 F (35.7 C)     Body mass index is 30.55 kg/(m^2).    ECOG FS:0 - Asymptomatic  PHYSICAL EXAM: GENERAL:  Well developed, well nourished, sitting comfortably in the exam room in no acute distress.  She is slightly apprehensive and somewhat depressed MENTAL STATUS:  Alert and oriented to person, place and time. HEAD:    Normocephalic, atraumatic, face symmetric, no Cushingoid features. EYES:  Pupils equal round and reactive to light and accomodation.  No conjunctivitis or scleral icterus. ENT:  Oropharynx clear without lesion.  Tongue normal. Mucous membranes moist.  RESPIRATORY:  Clear to auscultation without rales, wheezes or rhonchi. CARDIOVASCULAR:  Regular rate and rhythm without murmur, rub or gallop.  ABDOMEN:  Soft, non-tender, with active bowel sounds, and no  hepatosplenomegaly.  No masses. Abdominal wound is healing well. BACK:  No CVA tenderness.  No tenderness on percussion of the back or rib cage. SKIN:  No rashes, ulcers or lesions. EXTREMITIES: No edema, no skin discoloration or tenderness.  No palpable cords. LYMPH NODES: No palpable cervical, supraclavicular, axillary or inguinal adenopathy  NEUROLOGICAL: Unremarkable. PSYCH:  Appropriate. port site is within normal limit  LAB RESULTS:  Infusion on 01/19/2015  Component Date Value Ref Range Status  . WBC 01/19/2015 4.6  3.6 - 11.0 K/uL Final   A-LINE DRAW  . RBC 01/19/2015 4.53  3.80 - 5.20 MIL/uL Final  . Hemoglobin 01/19/2015 13.5  12.0 - 16.0 g/dL Final  . HCT 01/19/2015 41.3  35.0 - 47.0 % Final  . MCV 01/19/2015 91.1  80.0 - 100.0 fL Final  . MCH 01/19/2015 29.8  26.0 - 34.0 pg Final  . MCHC 01/19/2015 32.7  32.0 - 36.0 g/dL Final  . RDW 01/19/2015 12.8  11.5 - 14.5 % Final  . Platelets 01/19/2015 201  150 - 440 K/uL Final  . Neutrophils Relative % 01/19/2015 35   Final  . Neutro Abs 01/19/2015 1.6  1.4 - 6.5 K/uL Final  . Lymphocytes Relative 01/19/2015 55   Final  . Lymphs Abs 01/19/2015 2.5  1.0 - 3.6 K/uL Final  . Monocytes Relative 01/19/2015 7   Final  . Monocytes Absolute 01/19/2015 0.3  0.2 - 0.9 K/uL Final  . Eosinophils Relative 01/19/2015 2   Final  . Eosinophils Absolute 01/19/2015 0.1  0 - 0.7 K/uL Final  . Basophils Relative 01/19/2015 1   Final  . Basophils Absolute 01/19/2015 0.0  0 - 0.1 K/uL Final  . Sodium 01/19/2015 132* 135 - 145 mmol/L Final  . Potassium 01/19/2015 3.8  3.5 - 5.1 mmol/L Final  . Chloride 01/19/2015 102  101 - 111 mmol/L Final  . CO2 01/19/2015 25  22 - 32 mmol/L Final  . Glucose, Bld 01/19/2015 119* 65 - 99 mg/dL Final  . BUN 01/19/2015 19  6 - 20 mg/dL Final  . Creatinine, Ser 01/19/2015 1.00  0.44 - 1.00 mg/dL Final  . Calcium 01/19/2015 8.7* 8.9 - 10.3 mg/dL Final  . Total Protein 01/19/2015 6.6  6.5 - 8.1 g/dL Final  .  Albumin 01/19/2015 3.6  3.5 - 5.0 g/dL Final  .  AST 01/19/2015 19  15 - 41 U/L Final  . ALT 01/19/2015 21  14 - 54 U/L Final  . Alkaline Phosphatase 01/19/2015 76  38 - 126 U/L Final  . Total Bilirubin 01/19/2015 0.4  0.3 - 1.2 mg/dL Final  . GFR calc non Af Amer 01/19/2015 55* >60 mL/min Final  . GFR calc Af Amer 01/19/2015 >60  >60 mL/min Final   Comment: (NOTE) The eGFR has been calculated using the CKD EPI equation. This calculation has not been validated in all clinical situations. eGFR's persistently <60 mL/min signify possible Chronic Kidney Disease.   . Anion gap 01/19/2015 5  5 - 15 Final    No results found for: LABCA2 No results found for: CA199 No results found for: CEA No results found for: PSA Lab Results  Component Value Date   CA125 2354.0* 12/08/2014     STUDIES: Portable Chest 1 View  12/22/2014   CLINICAL DATA:  Port-A-Cath placement today.  EXAM: PORTABLE CHEST - 1 VIEW  COMPARISON:  PA and lateral chest 12/14/2014.  FINDINGS: Left subclavian approach Port-A-Cath is in place with the tip projecting over the lower superior vena cava. Tubing is intact. There is no pneumothorax. Lungs are clear. Heart size is normal.  IMPRESSION: Port-A-Cath projects in good position. Negative for pneumothorax or acute abnormality.   Electronically Signed   By: Inge Rise M.D.   On: 12/22/2014 17:36   Dg C-arm 1-60 Min-no Report  12/23/2014   : Fluoroscopy was utilized by the requesting physician. No radiographic interpretation.   Electronically Signed   By: Porfirio Mylar   On: 12/23/2014 12:12    ASSESSMENT: Carcinoma of ovary stage III disease status post bilateral salpingo-oophorectomy and omentectomy with optimal debulking surgery. Starting adjuvant chemotherapy Patient is starting chemotherapy with carboplatinum and Taxol given in dose dense fashion. Consent has been obtained. Previous history of carcinoma of breast status post lumpectomy radiation and 5 years of  tamoxifen  MEDICAL DECISION MAKING:  I had prolonged discussion with patient regarding adjuvant chemotherapy.  Patient will attend chemotherapy class tomorrow possibility of starting chemotherapy next week Carboplatinum Taxol and Taxol be given in dose dense fashion. All the questions were answered. BRCA mutation studies pending. Peripheral neuropathy grade 1 which is resolved at present time  All lab data has been reviewed.  Patient will receive day 8 and 15 chemotherapy with Taxol.  If there is any other significant problem with numbness in lower extremity patient will be evaluated in the interim.  Otherwise will be seen in 3 weeks.  For next cycle of chemotherapy Patient expressed understanding and was in agreement with this plan. She also understands that She can call clinic at any time with any questions, concerns, or complaints.    Ovarian cancer   Staging form: Ovary, AJCC 7th Edition     Clinical: Stage IIIC (T3c, N0, M0) - Marni Griffon, MD   01/19/2015 3:48 PM

## 2015-01-26 ENCOUNTER — Inpatient Hospital Stay: Payer: PPO

## 2015-01-26 DIAGNOSIS — C801 Malignant (primary) neoplasm, unspecified: Secondary | ICD-10-CM

## 2015-01-26 DIAGNOSIS — Z5111 Encounter for antineoplastic chemotherapy: Secondary | ICD-10-CM | POA: Diagnosis not present

## 2015-01-26 DIAGNOSIS — C562 Malignant neoplasm of left ovary: Secondary | ICD-10-CM

## 2015-01-26 LAB — CBC WITH DIFFERENTIAL/PLATELET
BASOS ABS: 0 10*3/uL (ref 0–0.1)
Basophils Relative: 1 %
EOS PCT: 2 %
Eosinophils Absolute: 0.1 10*3/uL (ref 0–0.7)
HEMATOCRIT: 38.7 % (ref 35.0–47.0)
HEMOGLOBIN: 12.7 g/dL (ref 12.0–16.0)
LYMPHS ABS: 2.7 10*3/uL (ref 1.0–3.6)
LYMPHS PCT: 70 %
MCH: 29.9 pg (ref 26.0–34.0)
MCHC: 32.7 g/dL (ref 32.0–36.0)
MCV: 91.5 fL (ref 80.0–100.0)
Monocytes Absolute: 0.5 10*3/uL (ref 0.2–0.9)
Monocytes Relative: 12 %
NEUTROS PCT: 15 %
Neutro Abs: 0.6 10*3/uL — ABNORMAL LOW (ref 1.4–6.5)
PLATELETS: 184 10*3/uL (ref 150–440)
RBC: 4.23 MIL/uL (ref 3.80–5.20)
RDW: 12.7 % (ref 11.5–14.5)
WBC: 3.9 10*3/uL (ref 3.6–11.0)

## 2015-01-26 LAB — COMPREHENSIVE METABOLIC PANEL
ALT: 17 U/L (ref 14–54)
AST: 18 U/L (ref 15–41)
Albumin: 3.5 g/dL (ref 3.5–5.0)
Alkaline Phosphatase: 78 U/L (ref 38–126)
Anion gap: 6 (ref 5–15)
BILIRUBIN TOTAL: 0.3 mg/dL (ref 0.3–1.2)
BUN: 17 mg/dL (ref 6–20)
CO2: 25 mmol/L (ref 22–32)
CREATININE: 1.05 mg/dL — AB (ref 0.44–1.00)
Calcium: 9.1 mg/dL (ref 8.9–10.3)
Chloride: 106 mmol/L (ref 101–111)
GFR calc Af Amer: >60 mL/min (ref >60)
GFR calc non Af Amer: 52 mL/min — ABNORMAL LOW (ref >60)
Glucose, Bld: 119 mg/dL — ABNORMAL HIGH (ref 65–99)
Potassium: 3.9 mmol/L (ref 3.5–5.1)
Sodium: 137 mmol/L (ref 135–145)
TOTAL PROTEIN: 6.6 g/dL (ref 6.5–8.1)

## 2015-01-26 MED ORDER — HEPARIN SOD (PORK) LOCK FLUSH 100 UNIT/ML IV SOLN
500.0000 [IU] | Freq: Once | INTRAVENOUS | Status: AC
  Administered 2015-01-26: 500 [IU] via INTRAVENOUS

## 2015-01-26 MED ORDER — HEPARIN SOD (PORK) LOCK FLUSH 100 UNIT/ML IV SOLN
INTRAVENOUS | Status: AC
  Filled 2015-01-26: qty 5

## 2015-01-31 ENCOUNTER — Telehealth: Payer: Self-pay | Admitting: *Deleted

## 2015-01-31 NOTE — Telephone Encounter (Signed)
Advised her to contact Dr Dionne Bucy her cardiologist to discuss what to do

## 2015-02-01 DIAGNOSIS — I10 Essential (primary) hypertension: Secondary | ICD-10-CM | POA: Insufficient documentation

## 2015-02-02 ENCOUNTER — Encounter: Payer: Self-pay | Admitting: Obstetrics and Gynecology

## 2015-02-02 ENCOUNTER — Inpatient Hospital Stay (HOSPITAL_BASED_OUTPATIENT_CLINIC_OR_DEPARTMENT_OTHER): Payer: PPO | Admitting: Obstetrics and Gynecology

## 2015-02-02 VITALS — BP 120/78 | HR 70 | Temp 96.9°F | Wt 175.0 lb

## 2015-02-02 DIAGNOSIS — C569 Malignant neoplasm of unspecified ovary: Secondary | ICD-10-CM

## 2015-02-02 DIAGNOSIS — Z90722 Acquired absence of ovaries, bilateral: Secondary | ICD-10-CM

## 2015-02-02 DIAGNOSIS — Z90710 Acquired absence of both cervix and uterus: Secondary | ICD-10-CM

## 2015-02-02 DIAGNOSIS — Z9889 Other specified postprocedural states: Secondary | ICD-10-CM

## 2015-02-02 DIAGNOSIS — C561 Malignant neoplasm of right ovary: Secondary | ICD-10-CM

## 2015-02-02 NOTE — Progress Notes (Signed)
Gynecologic Oncology Interval Visit   Referring Provider: Blain Pais, MD.  Chief Concern: postoperative visit for stage IIIc ovarian cancer  Subjective:  Jamie Conner is a 74 y.o. female who is seen for postoperative visit for stage IIIc ovarian cancer.   Pathology and cytology DIAGNOSIS:  A. OVARY AND FALLOPIAN TUBE, RIGHT; SALPINGO-OOPHORECTOMY WITH TOUCH  PREP AND FROZEN SECTION:  - SEROUS CARCINOMA, HIGH GRADE AND INVASIVE OF OVARY WITH INVOLVEMENT OF  SURFACE, ADNEXAL SOFT TISSUES, AND FALLOPIAN TUBE.  - LYMPHOVASCULAR INVOLVEMENT IS PRESENT.  - SEE COMMENT AND CANCER SUMMARY.   B. ADNEXAL CYST, LEFT; CYSTECTOMY:  - SEROUS CARCINOMA, HIGH GRADE AND INVASIVE INVOLVING PARA-TUBAL AND  ADNEXAL SOFT TISSUES.   C. OVARY AND FALLOPIAN TUBE, LEFT; SALPINGO-OOPHORECTOMY:  - ENDOSALPINGIOSIS AND EPITHELIAL INCLUSION CYSTS OF OVARY.  - NO INVOLVEMENT OF TUBE OR OVARY BY SEROUS CARCINOMA.   D. URINARY BLADDER PERITONEUM; EXCISION:  - SEROUS CARCINOMA, HIGH GRADE AND INVASIVE INVOLVING PERITONEAL SOFT  TISSUES.   E. RECTO-SIGMOID MESENTERIC MASS; EXCISION:  - SEROUS CARCINOMA, HIGH GRADE AND INVASIVE INVOLVING MESOTHELIAL LINED  FIBROADIPOSE TISSUES.   F. PELVIC TUMOR NODULES; EXCISION:  - SEROUS CARCINOMA, HIGH GRADE AND INVASIVE INVOLVING MESOTHELIAL LINED  FIBROADIPOSE TISSUES.   G. OMENTUM; OMENTECTOMY:  - SEROUS CARCINOMA, HIGH GRADE AND INVASIVE INVOLVING MESOTHELIAL LINED  OMENTAL TISSUES.   H. MESENTERY, NOS; RESECTION:  - SEROUS CARCINOMA, HIGH GRADE AND INVASIVE INVOLVING MESOTHELIAL LINED  FIBROADIPOSE TISSUES.  - ONE LYMPH NODE, NEGATIVE FOR MALIGNANCY.    Gynecologic Oncology History Jamie Conner is a pleasant female who is seen for postoperative visit for stage IIIc ovarian cancer.   She initially presented with abdominal fullness, pain constipation.  CT scan ordered by her gastroenterologist shows 13 cm complex cystic and solid right  ovarian mass.  There are a few peritoneal implants,  and small amount of ascites.  No extensive carcinomatosis.  She also has a history of  carcinoma of breast ( left) status post lumpectomy , radiation therapy and 5 years of tamoxifen.   Lab Results  Component Value Date   CA125 2354.0* 12/08/2014    She underwent bilateral salpingo-oophorectomy with optimal debulking surgery on fourth of May, 2016.  She started chemotherapy with carboplatinum and Taxol in dose dense fashion from Jan 02, 2015   Genetic testing: ATM mutation [id number]  Problem List: Patient Active Problem List   Diagnosis Date Noted  . Retroperitoneal fibrosis   . Combined fat and carbohydrate induced hyperlipemia 01/04/2015  . Awareness of heartbeats 01/04/2015  . Beat, premature ventricular 01/04/2015  . Ovarian cancer 12/16/2014  . MI (mitral incompetence) 09/02/2014  . AF (paroxysmal atrial fibrillation) 09/02/2014    Past Medical History: Past Medical History  Diagnosis Date  . Atrial fibrillation   . Hyperlipidemia   . PVC (premature ventricular contraction)   . Mitral insufficiency   . GERD (gastroesophageal reflux disease)   . Cancer of breast     Left  . Hypothyroidism   . Ovarian cancer     s/p BSO optimal tumor debulking May 2016    Past Surgical History: Past Surgical History  Procedure Laterality Date  . Cesarean section      x2  . Abdominal hysterectomy    . Cholecystectomy    . Colonoscopy  2006  . Breast surgery      Left  . Laparotomy with staging N/A 12/22/2014    Procedure: LAPAROTOMY WITH STAGING;  Surgeon: Gillis Ends, MD;  Location: [facility]  ORS;  Service: Gynecology;  Laterality: N/A;  . Salpingoophorectomy Bilateral 12/22/2014    Procedure: SALPINGO OOPHORECTOMY;  Surgeon: Gillis Ends, MD;  Location: [facility] ORS;  Service: Gynecology;  Laterality: Bilateral;  . Debulking N/A 12/22/2014    Procedure: DEBULKING;  Surgeon: Gillis Ends, MD;   Location: [facility] ORS;  Service: Gynecology;  Laterality: N/A;  . Laparotomy N/A 12/22/2014    Procedure: EXPLORATORY LAPAROTOMY;  Surgeon: Robert Bellow, MD;  Location: [facility] ORS;  Service: General;  Laterality: N/A;  . Portacath placement Right 12/22/2014    Procedure: INSERTION PORT-A-CATH;  Surgeon: Robert Bellow, MD;  Location: [facility] ORS;  Service: General;  Laterality: Right;  . Peripheral vascular catheterization Left 12/22/2014    Procedure: PORTA CATH INSERTION;  Surgeon: Robert Bellow, MD;  Location: [facility] ORS;  Service: General;  Laterality: Left;  . Bilateral salpingoophorectomy  May 2016    with optimal tumor debulking     Past Gynecologic History:  See HPI  OB History:  OB History  Gravida Para Term Preterm AB SAB TAB Ectopic Multiple Living  2         2    # Outcome Date GA Lbr Len/2nd Weight Sex Delivery Anes PTL Lv  2 Gravida           1 Gravida             Obstetric Comments  1st Menstrual Cycle:  14  1st Pregnancy:  70    Family History: History reviewed. No pertinent family history.  Social History: History   Social History  . Marital Status: Married    Spouse Name: N/A  . Number of Children: N/A  . Years of Education: N/A   Occupational History  . Not on file.   Social History Main Topics  . Smoking status: Never Smoker   . Smokeless tobacco: Never Used  . Alcohol Use: No  . Drug Use: No  . Sexual Activity: Not on file   Other Topics Concern  . Not on file   Social History Narrative    Allergies: Allergies  Allergen Reactions  . Carafate [Sucralfate] Rash  . Sulfa Antibiotics Rash    Current Medications: Current Outpatient Prescriptions  Medication Sig Dispense Refill  . acetaminophen (TYLENOL) 500 MG chewable tablet Chew 500 mg by mouth every 6 (six) hours as needed for pain.    Marland Kitchen docusate sodium (COLACE) 50 MG capsule Take 50 mg by mouth 2 (two) times daily.    Marland Kitchen levothyroxine (SYNTHROID, LEVOTHROID) 50 MCG tablet Take 50 mcg  by mouth daily before breakfast.    . lidocaine-prilocaine (EMLA) cream Apply 1 application topically as needed. 30 g 0  . LORazepam (ATIVAN) 0.5 MG tablet Take 1 tablet (0.5 mg total) by mouth at bedtime. 30 tablet 3  . ondansetron (ZOFRAN) 4 MG tablet Take 1 tablet (4 mg total) by mouth every 6 (six) hours as needed for nausea or vomiting. 30 tablet 3  . pantoprazole (PROTONIX) 40 MG tablet Take 40 mg by mouth daily.    . polyethylene glycol (MIRALAX / GLYCOLAX) packet Take 17 g by mouth daily.    . propafenone (RYTHMOL) 225 MG tablet Take 225 mg by mouth 2 (two) times daily.     No current facility-administered medications for this visit.    Review of Systems General: fatigue  HEENT: no complaints  Lungs: no complaints  Cardiac: no complaints  GI: constipation  GU: no complaints  Musculoskeletal: no complaints  Extremities:  numbness/tingling in right foot  Skin: no complaints  Neuro: no complaints  Endocrine: no complaints  Psych: no complaints       Objective:  Physical Examination:  BP 120/78 mmHg  Pulse 70  Temp(Src) 96.9 F (36.1 C) (Tympanic)  Wt 175 lb 0.7 oz (79.4 kg)   ECOG Performance Status: 0 - Asymptomatic  General appearance: alert, cooperative and appears stated age HEENT: ATNC Lymph node survey: non-palpable inguinal Abdomen: SNDNT. Incision all well healed. No hernias, no masses, no ascites.  Extremities:No edema Skin exam - Well healed incisions.  Neurological exam reveals alert, oriented, normal speech, no focal findings or movement disorder noted  Pelvic: Vulva: normal appearing vulva with no masses, tenderness or lesions; Vagina: normal; Adnexa: negative for masses or nodularity; Uterus and Cervix: surgically absent; Rectal: confirms    Lab Review Labs on site today: none  Radiologic Imaging: none    Assessment:  CHERITY BLICKENSTAFF is a 74 y.o. female diagnosed with optimally debulked stage IIIc ovarian cancer currently on chemotherapy.  ATM mutation.   Plan:   Problem List Items Addressed This Visit      Genitourinary   Ovarian cancer - Primary (Chronic)      Continue chemotherapy under the direction of Dr. Oliva Bustard and follow up regarding ATM mutation recommendations regarding screening and cascade testing with Dr. Oliva Bustard.      Gillis Ends, MD    CC:  Blain Pais, MD.

## 2015-02-07 ENCOUNTER — Encounter: Payer: Self-pay | Admitting: Oncology

## 2015-02-07 ENCOUNTER — Inpatient Hospital Stay (HOSPITAL_BASED_OUTPATIENT_CLINIC_OR_DEPARTMENT_OTHER): Payer: PPO | Admitting: Oncology

## 2015-02-07 ENCOUNTER — Inpatient Hospital Stay: Payer: PPO

## 2015-02-07 VITALS — BP 121/73 | HR 80 | Temp 96.2°F | Wt 174.2 lb

## 2015-02-07 VITALS — BP 127/83 | HR 60

## 2015-02-07 DIAGNOSIS — Z9223 Personal history of estrogen therapy: Secondary | ICD-10-CM

## 2015-02-07 DIAGNOSIS — C561 Malignant neoplasm of right ovary: Secondary | ICD-10-CM

## 2015-02-07 DIAGNOSIS — M79669 Pain in unspecified lower leg: Secondary | ICD-10-CM

## 2015-02-07 DIAGNOSIS — Z9889 Other specified postprocedural states: Secondary | ICD-10-CM

## 2015-02-07 DIAGNOSIS — Z90710 Acquired absence of both cervix and uterus: Secondary | ICD-10-CM | POA: Diagnosis not present

## 2015-02-07 DIAGNOSIS — C562 Malignant neoplasm of left ovary: Secondary | ICD-10-CM

## 2015-02-07 DIAGNOSIS — I493 Ventricular premature depolarization: Secondary | ICD-10-CM

## 2015-02-07 DIAGNOSIS — I4891 Unspecified atrial fibrillation: Secondary | ICD-10-CM

## 2015-02-07 DIAGNOSIS — Z923 Personal history of irradiation: Secondary | ICD-10-CM

## 2015-02-07 DIAGNOSIS — C569 Malignant neoplasm of unspecified ovary: Secondary | ICD-10-CM

## 2015-02-07 DIAGNOSIS — Z90722 Acquired absence of ovaries, bilateral: Secondary | ICD-10-CM | POA: Diagnosis not present

## 2015-02-07 DIAGNOSIS — Z5111 Encounter for antineoplastic chemotherapy: Secondary | ICD-10-CM | POA: Diagnosis not present

## 2015-02-07 DIAGNOSIS — R110 Nausea: Secondary | ICD-10-CM

## 2015-02-07 DIAGNOSIS — K5900 Constipation, unspecified: Secondary | ICD-10-CM

## 2015-02-07 DIAGNOSIS — Z853 Personal history of malignant neoplasm of breast: Secondary | ICD-10-CM

## 2015-02-07 LAB — COMPREHENSIVE METABOLIC PANEL
ALK PHOS: 81 U/L (ref 38–126)
ALT: 12 U/L — ABNORMAL LOW (ref 14–54)
AST: 17 U/L (ref 15–41)
Albumin: 3.6 g/dL (ref 3.5–5.0)
Anion gap: 4 — ABNORMAL LOW (ref 5–15)
BILIRUBIN TOTAL: 0.4 mg/dL (ref 0.3–1.2)
BUN: 16 mg/dL (ref 6–20)
CO2: 27 mmol/L (ref 22–32)
Calcium: 9 mg/dL (ref 8.9–10.3)
Chloride: 107 mmol/L (ref 101–111)
Creatinine, Ser: 0.89 mg/dL (ref 0.44–1.00)
GFR calc non Af Amer: >60 mL/min (ref >60)
Glucose, Bld: 98 mg/dL (ref 65–99)
Potassium: 3.9 mmol/L (ref 3.5–5.1)
Sodium: 138 mmol/L (ref 135–145)
TOTAL PROTEIN: 6.6 g/dL (ref 6.5–8.1)

## 2015-02-07 LAB — CBC WITH DIFFERENTIAL/PLATELET
Basophils Absolute: 0.1 10*3/uL (ref 0–0.1)
Basophils Relative: 1 %
EOS PCT: 2 %
Eosinophils Absolute: 0.1 10*3/uL (ref 0–0.7)
HCT: 41.2 % (ref 35.0–47.0)
HEMOGLOBIN: 13.6 g/dL (ref 12.0–16.0)
LYMPHS ABS: 2.3 10*3/uL (ref 1.0–3.6)
LYMPHS PCT: 44 %
MCH: 30.1 pg (ref 26.0–34.0)
MCHC: 32.9 g/dL (ref 32.0–36.0)
MCV: 91.5 fL (ref 80.0–100.0)
Monocytes Absolute: 0.7 10*3/uL (ref 0.2–0.9)
Monocytes Relative: 13 %
Neutro Abs: 2.1 10*3/uL (ref 1.4–6.5)
Neutrophils Relative %: 40 %
Platelets: 209 10*3/uL (ref 150–440)
RBC: 4.51 MIL/uL (ref 3.80–5.20)
RDW: 13.6 % (ref 11.5–14.5)
WBC: 5.3 10*3/uL (ref 3.6–11.0)

## 2015-02-07 MED ORDER — DIPHENHYDRAMINE HCL 50 MG/ML IJ SOLN
50.0000 mg | Freq: Once | INTRAMUSCULAR | Status: AC
  Administered 2015-02-07: 50 mg via INTRAVENOUS
  Filled 2015-02-07: qty 1

## 2015-02-07 MED ORDER — SODIUM CHLORIDE 0.9 % IJ SOLN
10.0000 mL | INTRAMUSCULAR | Status: DC | PRN
  Administered 2015-02-07: 10 mL
  Filled 2015-02-07: qty 10

## 2015-02-07 MED ORDER — FOSAPREPITANT DIMEGLUMINE INJECTION 150 MG
Freq: Once | INTRAVENOUS | Status: AC
  Administered 2015-02-07: 10:00:00 via INTRAVENOUS
  Filled 2015-02-07: qty 5

## 2015-02-07 MED ORDER — SODIUM CHLORIDE 0.9 % IV SOLN
Freq: Once | INTRAVENOUS | Status: AC
  Administered 2015-02-07: 10:00:00 via INTRAVENOUS
  Filled 2015-02-07: qty 1000

## 2015-02-07 MED ORDER — PACLITAXEL CHEMO INJECTION 300 MG/50ML
80.0000 mg/m2 | Freq: Once | INTRAVENOUS | Status: AC
  Administered 2015-02-07: 150 mg via INTRAVENOUS
  Filled 2015-02-07: qty 25

## 2015-02-07 MED ORDER — FAMOTIDINE IN NACL 20-0.9 MG/50ML-% IV SOLN
20.0000 mg | Freq: Once | INTRAVENOUS | Status: AC
  Administered 2015-02-07: 20 mg via INTRAVENOUS
  Filled 2015-02-07: qty 50

## 2015-02-07 MED ORDER — HEPARIN SOD (PORK) LOCK FLUSH 100 UNIT/ML IV SOLN
500.0000 [IU] | Freq: Once | INTRAVENOUS | Status: AC | PRN
  Administered 2015-02-07: 500 [IU]
  Filled 2015-02-07: qty 5

## 2015-02-07 MED ORDER — PALONOSETRON HCL INJECTION 0.25 MG/5ML
0.2500 mg | Freq: Once | INTRAVENOUS | Status: AC
  Administered 2015-02-07: 0.25 mg via INTRAVENOUS
  Filled 2015-02-07: qty 5

## 2015-02-07 MED ORDER — SODIUM CHLORIDE 0.9 % IV SOLN
447.0000 mg | Freq: Once | INTRAVENOUS | Status: AC
  Administered 2015-02-07: 450 mg via INTRAVENOUS
  Filled 2015-02-07: qty 45

## 2015-02-07 NOTE — Progress Notes (Signed)
[place] @ [facility] [phone]) [phone]  Fax:(336) [place]: Dec 20, 1940  MR#: [id number]  CHY#:[id number]  Patient Care Team: Jerrol Banana., MD as PCP - General (Family Medicine) [facility] Gaetana Michaelis, MD as Referring Physician (Obstetrics and Gynecology) Robert Bellow, MD as Consulting Physician (General Surgery)  CHIEF COMPLAINT:  Chief Complaint  Patient presents with  . 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 2.   carcinoma  of breast ( left) status post lumpectomy ,adiation  therapyand 5 years of tamoxifen. 3.history of atrial fibrillation being managed by cardiologist 4.  Status post bilateral salpingo-oophorectomy on fourth of May, 2016.  with optimal debulking surgery 5.  Starting chemotherapy with carboplatinum and Taxol in dose dense fashion from Jan 02, 2015 5.  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     Ovarian cancer   12/16/2014 Initial Diagnosis Ovarian cancer    Oncology Flowsheet 12/22/2014 12/23/2014 12/24/2014 12/25/2014 12/26/2014 01/12/2015 01/19/2015  Day, Cycle - - - - - Day 1, Cycle 1 Day 8, Cycle 1  CARBOplatin (PARAPLATIN) IV - - - - - 540 mg -  dexamethasone (DECADRON) IJ - - - - - - -  dexamethasone (DECADRON) IV - - - - - [ 12 mg ] -  enoxaparin (LOVENOX)  - 40 mg 40 mg 40 mg 40 mg - -  fosaprepitant (EMEND) IV - - - - - [ 150 mg ] -  ondansetron (ZOFRAN) IV - - - - - - -  PACLitaxel (TAXOL) IV - - - - - 80 mg/m2 80 mg/m2  palonosetron (ALOXI) IV - - - - - 0.25 mg -  scopolamine 1 MG/3DAYS (TRANSDERM-SCOP) TD - - - - - - -    INTERVAL HISTORY: 74 year old lady with a history of carcinoma of ovary status post bilateral salpingo-oophorectomy on May 4.  Patient is here for ongoing evaluation gradually recovering from surgery had a port placement.   Appetite has been improving.  No nausea.  No vomiting.  Somewhat depressed and anxious here for evaluation fashion January 19, 2015 Patient is here today for day 8 chemotherapy with Taxol.  Patient complains of burning in lower extremity which is intermediate and had mild nausea grade 2.  No abdominal discomfort.  Here for further follow-up and treatment consideration no soreness in the mouth.  No dysphagia. February 07, 2015 Patient is here for second cycle of chemotherapy with carboplatin and Taxol received weekly 2 Taxol treatment could not receive day 15 Taxol treatment because of low blood count.  Had episode of nausea.  Constipation remains major problem.  No tingling.  No numbness. REVIEW OF SYSTEMS:   GENERAL:  Feels good.  Active.  No fevers, sweats or weight loss. PERFORMANCE STATUS (ECOG):  0 HEENT:  No visual changes, runny nose, sore throat, mouth sores or tenderness. Lungs: No shortness of breath or cough.  No hemoptysis. Cardiac:  No chest pain, palpitations, orthopnea, or PND. GI: Mild nausea.  Constipation.  No vomiting.  No diarrhea.  GU:  No urgency, frequency, dysuria, or hematuria. Musculoskeletal:  No back pain.  No joint pain.  No muscle tenderness. Extremities:  No pain or swelling. Skin:  No rashes or skin changes. Neuro:  No headache, numbness or weakness, balance or coordination issues. Endocrine:  No diabetes, thyroid issues, hot flashes or  night sweats. Psych:  No mood changes, depression or anxiety. Pain:  No focal pain. Review of systems:  All other systems reviewed and found to be negative.  As per HPI. Otherwise, a complete review of systems is negatve.  PAST MEDICAL HISTORY: Past Medical History  Diagnosis Date  . Atrial fibrillation   . Hyperlipidemia   . PVC (premature ventricular contraction)   . Mitral insufficiency   . GERD (gastroesophageal reflux disease)   . Cancer of breast     Left  . Hypothyroidism   . Ovarian cancer     s/p BSO optimal tumor  debulking May 2016    PAST SURGICAL HISTORY: Past Surgical History  Procedure Laterality Date  . Cesarean section      x2  . Abdominal hysterectomy    . Cholecystectomy    . Colonoscopy  2006  . Breast surgery      Left  . Laparotomy with staging N/A 12/22/2014    Procedure: LAPAROTOMY WITH STAGING;  Surgeon: Gillis Ends, MD;  Location: [facility] OR[facility];  Service: Gynecology;  Laterality: N/A;  . Salpingoophorectomy Bilateral 12/22/2014    Procedure: SALPINGO OOPHORECTOMY;  Surgeon: Gillis Ends, MD;  Location: [facility];  Service: Gynecology;  Laterality: Bilateral;  . Debulking N/A 12/22/2014    Procedure: DEBULKING;  Surgeon: Gillis Ends, MD;  Location: [facility];  Service: Gynecology;  Laterality: N/A;  . Laparotomy N/A 12/22/2014    Procedure: EXPLORATORY LAPAROTOMY;  Surgeon: Robert Bellow, MD;  Location: [facility];  Service: General;  Laterality: N/A;  . Portacath placement Right 12/22/2014    Procedure: INSERTION PORT-A-CATH;  Surgeon: Robert Bellow, MD;  Location: [facility];  Service: General;  Laterality: Right;  . Peripheral vascular catheterization Left 12/22/2014    Procedure: PORTA CATH INSERTION;  Surgeon: Robert Bellow, MD;  Location: [facility];  Service: General;  Laterality: Left;  . Bilateral salpingoophorectomy  May 2016    with optimal tumor debulking     FAMILY HISTORY Family History  Problem Relation Age of Onset  .       GYNECOLOGIC HISTORY:  No LMP recorded. Patient is postmenopausal.     ADVANCED DIRECTIVES: Patient does not have any advanced healthcare directive. Information has been given.   HEALTH MAINTENANCE: History  Substance Use Topics  . Smoking status: Never Smoker   . Smokeless tobacco: Never Used  . Alcohol Use: No      Allergies  Allergen Reactions  . Carafate [Sucralfate] Rash  . Sulfa Antibiotics Rash    Current Outpatient Prescriptions  Medication Sig Dispense Refill  . acetaminophen (TYLENOL) 500  MG chewable tablet Chew 500 mg by mouth every 6 (six) hours as needed for pain.    Marland Kitchen docusate sodium (COLACE) 50 MG capsule Take 50 mg by mouth 2 (two) times daily.    Marland Kitchen levothyroxine (SYNTHROID, LEVOTHROID) 50 MCG tablet Take 50 mcg by mouth daily before breakfast.    . lidocaine-prilocaine (EMLA) cream Apply 1 application topically as needed. 30 g 0  . LORazepam (ATIVAN) 0.5 MG tablet Take 1 tablet (0.5 mg total) by mouth at bedtime. 30 tablet 3  . ondansetron (ZOFRAN) 4 MG tablet Take 1 tablet (4 mg total) by mouth every 6 (six) hours as needed for nausea or vomiting. 30 tablet 3  . pantoprazole (PROTONIX) 40 MG tablet Take 40 mg by mouth daily.    . polyethylene glycol (MIRALAX / GLYCOLAX) packet Take 17 g by mouth daily.    Marland Kitchen  propafenone (RYTHMOL) 225 MG tablet Take 225 mg by mouth 2 (two) times daily.     No current facility-administered medications for this visit.    OBJECTIVE:  Filed Vitals:   02/07/15 0830  BP: 121/73  Pulse: 80  Temp: 96.2 F (35.7 C)     Body mass index is 30.86 kg/(m^2).    ECOG FS:0 - Asymptomatic  PHYSICAL EXAM: GENERAL:  Well developed, well nourished, sitting comfortably in the exam room in no acute distress.  She is slightly apprehensive and somewhat depressed MENTAL STATUS:  Alert and oriented to person, place and time. HEAD:    Normocephalic, atraumatic, face symmetric, no Cushingoid features. EYES:  Pupils equal round and reactive to light and accomodation.  No conjunctivitis or scleral icterus. ENT:  Oropharynx clear without lesion.  Tongue normal. Mucous membranes moist.  RESPIRATORY:  Clear to auscultation without rales, wheezes or rhonchi. CARDIOVASCULAR:  Regular rate and rhythm without murmur, rub or gallop.  ABDOMEN:  Soft, non-tender, with active bowel sounds, and no hepatosplenomegaly.  No masses. Abdominal wound is healing well. BACK:  No CVA tenderness.  No tenderness on percussion of the back or rib cage. SKIN:  No rashes, ulcers or  lesions. EXTREMITIES: No edema, no skin discoloration or tenderness.  No palpable cords. LYMPH NODES: No palpable cervical, supraclavicular, axillary or inguinal adenopathy  NEUROLOGICAL: Unremarkable. PSYCH:  Appropriate. port site is within normal limit  LAB RESULTS:  No visits with results within 3 Day(s) from this visit. Latest known visit with results is:  Appointment on 01/26/2015  Component Date Value Ref Range Status  . WBC 01/26/2015 3.9  3.6 - 11.0 K/uL Final  . RBC 01/26/2015 4.23  3.80 - 5.20 MIL/uL Final  . Hemoglobin 01/26/2015 12.7  12.0 - 16.0 g/dL Final  . HCT 01/26/2015 38.7  35.0 - 47.0 % Final  . MCV 01/26/2015 91.5  80.0 - 100.0 fL Final  . MCH 01/26/2015 29.9  26.0 - 34.0 pg Final  . MCHC 01/26/2015 32.7  32.0 - 36.0 g/dL Final  . RDW 01/26/2015 12.7  11.5 - 14.5 % Final  . Platelets 01/26/2015 184  150 - 440 K/uL Final  . Neutrophils Relative % 01/26/2015 15   Final  . Neutro Abs 01/26/2015 0.6* 1.4 - 6.5 K/uL Final  . Lymphocytes Relative 01/26/2015 70   Final  . Lymphs Abs 01/26/2015 2.7  1.0 - 3.6 K/uL Final  . Monocytes Relative 01/26/2015 12   Final  . Monocytes Absolute 01/26/2015 0.5  0.2 - 0.9 K/uL Final  . Eosinophils Relative 01/26/2015 2   Final  . Eosinophils Absolute 01/26/2015 0.1  0 - 0.7 K/uL Final  . Basophils Relative 01/26/2015 1   Final  . Basophils Absolute 01/26/2015 0.0  0 - 0.1 K/uL Final  . Sodium 01/26/2015 137  135 - 145 mmol/L Final  . Potassium 01/26/2015 3.9  3.5 - 5.1 mmol/L Final  . Chloride 01/26/2015 106  101 - 111 mmol/L Final  . CO2 01/26/2015 25  22 - 32 mmol/L Final  . Glucose, Bld 01/26/2015 119* 65 - 99 mg/dL Final  . BUN 01/26/2015 17  6 - 20 mg/dL Final  . Creatinine, Ser 01/26/2015 1.05* 0.44 - 1.00 mg/dL Final  . Calcium 01/26/2015 9.1  8.9 - 10.3 mg/dL Final  . Total Protein 01/26/2015 6.6  6.5 - 8.1 g/dL Final  . Albumin 01/26/2015 3.5  3.5 - 5.0 g/dL Final  . AST 01/26/2015 18  15 - 41 U/L Final  .  ALT  01/26/2015 17  14 - 54 U/L Final  . Alkaline Phosphatase 01/26/2015 78  38 - 126 U/L Final  . Total Bilirubin 01/26/2015 0.3  0.3 - 1.2 mg/dL Final  . GFR calc non Af Amer 01/26/2015 52* >60 mL/min Final  . GFR calc Af Amer 01/26/2015 >60  >60 mL/min Final   Comment: (NOTE) The eGFR has been calculated using the CKD EPI equation. This calculation has not been validated in all clinical situations. eGFR's persistently <60 mL/min signify possible Chronic Kidney Disease.   . Anion gap 01/26/2015 6  5 - 15 Final    No results found for: LABCA2 No results found for: CA199 No results found for: CEA No results found for: PSA Lab Results  Component Value Date   CA125 2354.0* 12/08/2014     STUDIES: No results found.  ASSESSMENT: Carcinoma of ovary stage III disease status post bilateral salpingo-oophorectomy and omentectomy with optimal debulking surgery. Starting adjuvant chemotherapy 2.  Patient does have high risk genetic mutation   With   ATM.  Patient's daughter who lesions seen [facility] had received a copy and she is going for take it with her gynecologist.  Her sister lives in different part of the [place] and patient is going to contact and send copy of the result to be taken to her primary care physician.  We offered her here genetic counseling. Down the road if needed patient will contact genetic counseling locally.   MEDICAL DECISION MAKING:   Lab data has been reviewed. Continue chemotherapy Constipation issue has been addressed. Genetic counseling has been done.  May need MRI scan of breasts for follow-up  chemotherapy Patient expressed understanding and was in agreement with this plan. She also understands that She can call clinic at any time with any questions, concerns, or complaints.    Ovarian cancer   Staging form: Ovary, AJCC 7th Edition     Clinical: Stage IIIC (T3c, N0, M0) - Unsigned   Forest Gleason, MD   02/07/2015 8:46 AM

## 2015-02-07 NOTE — Progress Notes (Signed)
Patient complains of irregular HR last week.  Wore halter monitor per Dr. Nehemiah Massed. Does not have results.  Also states she is having problems with regulating her bowels.

## 2015-02-14 ENCOUNTER — Inpatient Hospital Stay: Payer: PPO

## 2015-02-14 ENCOUNTER — Encounter: Payer: Self-pay | Admitting: Oncology

## 2015-02-14 ENCOUNTER — Inpatient Hospital Stay (HOSPITAL_BASED_OUTPATIENT_CLINIC_OR_DEPARTMENT_OTHER): Payer: PPO | Admitting: Oncology

## 2015-02-14 VITALS — BP 113/70 | HR 64 | Temp 97.0°F | Resp 20

## 2015-02-14 DIAGNOSIS — Z90722 Acquired absence of ovaries, bilateral: Secondary | ICD-10-CM

## 2015-02-14 DIAGNOSIS — C562 Malignant neoplasm of left ovary: Secondary | ICD-10-CM

## 2015-02-14 DIAGNOSIS — Z90710 Acquired absence of both cervix and uterus: Secondary | ICD-10-CM | POA: Diagnosis not present

## 2015-02-14 DIAGNOSIS — K5900 Constipation, unspecified: Secondary | ICD-10-CM

## 2015-02-14 DIAGNOSIS — R110 Nausea: Secondary | ICD-10-CM

## 2015-02-14 DIAGNOSIS — Z5111 Encounter for antineoplastic chemotherapy: Secondary | ICD-10-CM | POA: Diagnosis not present

## 2015-02-14 DIAGNOSIS — Z9223 Personal history of estrogen therapy: Secondary | ICD-10-CM

## 2015-02-14 DIAGNOSIS — I493 Ventricular premature depolarization: Secondary | ICD-10-CM

## 2015-02-14 DIAGNOSIS — E785 Hyperlipidemia, unspecified: Secondary | ICD-10-CM

## 2015-02-14 DIAGNOSIS — C561 Malignant neoplasm of right ovary: Secondary | ICD-10-CM

## 2015-02-14 DIAGNOSIS — I340 Nonrheumatic mitral (valve) insufficiency: Secondary | ICD-10-CM

## 2015-02-14 DIAGNOSIS — I4891 Unspecified atrial fibrillation: Secondary | ICD-10-CM

## 2015-02-14 DIAGNOSIS — Z853 Personal history of malignant neoplasm of breast: Secondary | ICD-10-CM

## 2015-02-14 DIAGNOSIS — E039 Hypothyroidism, unspecified: Secondary | ICD-10-CM

## 2015-02-14 LAB — COMPREHENSIVE METABOLIC PANEL
ALK PHOS: 68 U/L (ref 38–126)
ALT: 14 U/L (ref 14–54)
AST: 16 U/L (ref 15–41)
Albumin: 3.6 g/dL (ref 3.5–5.0)
Anion gap: 5 (ref 5–15)
BUN: 17 mg/dL (ref 6–20)
CALCIUM: 9 mg/dL (ref 8.9–10.3)
CO2: 27 mmol/L (ref 22–32)
Chloride: 105 mmol/L (ref 101–111)
Creatinine, Ser: 0.83 mg/dL (ref 0.44–1.00)
GFR calc Af Amer: >60 mL/min (ref >60)
GLUCOSE: 99 mg/dL (ref 65–99)
Potassium: 3.8 mmol/L (ref 3.5–5.1)
Sodium: 137 mmol/L (ref 135–145)
Total Bilirubin: 0.5 mg/dL (ref 0.3–1.2)
Total Protein: 6.6 g/dL (ref 6.5–8.1)

## 2015-02-14 LAB — CBC WITH DIFFERENTIAL/PLATELET
Basophils Absolute: 0 10*3/uL (ref 0–0.1)
Basophils Relative: 1 %
EOS ABS: 0.1 10*3/uL (ref 0–0.7)
Eosinophils Relative: 3 %
HEMATOCRIT: 39 % (ref 35.0–47.0)
HEMOGLOBIN: 12.9 g/dL (ref 12.0–16.0)
LYMPHS ABS: 2.4 10*3/uL (ref 1.0–3.6)
Lymphocytes Relative: 50 %
MCH: 30.3 pg (ref 26.0–34.0)
MCHC: 33.1 g/dL (ref 32.0–36.0)
MCV: 91.3 fL (ref 80.0–100.0)
MONOS PCT: 5 %
Monocytes Absolute: 0.3 10*3/uL (ref 0.2–0.9)
Neutro Abs: 2 10*3/uL (ref 1.4–6.5)
Neutrophils Relative %: 41 %
Platelets: 179 10*3/uL (ref 150–440)
RBC: 4.28 MIL/uL (ref 3.80–5.20)
RDW: 13.8 % (ref 11.5–14.5)
WBC: 4.8 10*3/uL (ref 3.6–11.0)

## 2015-02-14 MED ORDER — SODIUM CHLORIDE 0.9 % IJ SOLN
10.0000 mL | INTRAMUSCULAR | Status: DC | PRN
  Administered 2015-02-14: 10 mL via INTRAVENOUS
  Filled 2015-02-14: qty 10

## 2015-02-14 MED ORDER — HEPARIN SOD (PORK) LOCK FLUSH 100 UNIT/ML IV SOLN: 500.0000 [IU] | Freq: Once | INTRAVENOUS | Status: DC

## 2015-02-14 MED ORDER — HEPARIN SOD (PORK) LOCK FLUSH 100 UNIT/ML IV SOLN
500.0000 [IU] | Freq: Once | INTRAVENOUS | Status: AC
  Administered 2015-02-14: 500 [IU] via INTRAVENOUS
  Filled 2015-02-14: qty 5

## 2015-02-14 MED ORDER — SODIUM CHLORIDE 0.9 % IV SOLN
Freq: Once | INTRAVENOUS | Status: AC
  Administered 2015-02-14: 10:00:00 via INTRAVENOUS
  Filled 2015-02-14: qty 4

## 2015-02-14 MED ORDER — SODIUM CHLORIDE 0.9 % IV SOLN
Freq: Once | INTRAVENOUS | Status: AC
  Administered 2015-02-14: 10:00:00 via INTRAVENOUS
  Filled 2015-02-14: qty 1000

## 2015-02-14 MED ORDER — SODIUM CHLORIDE 0.9 % IJ SOLN
10.0000 mL | INTRAMUSCULAR | Status: DC | PRN
  Filled 2015-02-14: qty 10

## 2015-02-14 MED ORDER — DIPHENHYDRAMINE HCL 50 MG/ML IJ SOLN
50.0000 mg | Freq: Once | INTRAMUSCULAR | Status: AC
  Administered 2015-02-14: 50 mg via INTRAVENOUS
  Filled 2015-02-14: qty 1

## 2015-02-14 MED ORDER — PACLITAXEL CHEMO INJECTION 300 MG/50ML
80.0000 mg/m2 | Freq: Once | INTRAVENOUS | Status: AC
  Administered 2015-02-14: 150 mg via INTRAVENOUS
  Filled 2015-02-14: qty 25

## 2015-02-14 MED ORDER — FAMOTIDINE IN NACL 20-0.9 MG/50ML-% IV SOLN
20.0000 mg | Freq: Once | INTRAVENOUS | Status: AC
  Administered 2015-02-14: 20 mg via INTRAVENOUS
  Filled 2015-02-14: qty 50

## 2015-02-14 NOTE — Progress Notes (Signed)
[place] @ [facility] Telephone:(336) [phone]  Fax:(336) [place]: April 24, 1941  MR#: [id number]  ZHG#:[id number]  Patient Care Team: Jerrol Banana., MD as PCP - General (Family Medicine) [facility] Gaetana Michaelis, MD as Referring Physician (Obstetrics and Gynecology) Robert Bellow, MD as Consulting Physician (General Surgery)  CHIEF COMPLAINT:  Chief Complaint  Patient presents with  . 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 2.   carcinoma  of breast ( left) status post lumpectomy ,adiation  therapyand 5 years of tamoxifen. 3.history of atrial fibrillation being managed by cardiologist 4.  Status post bilateral salpingo-oophorectomy on fourth of May, 2016.  with optimal debulking surgery 5.  Starting chemotherapy with carboplatinum and Taxol in dose dense fashion from Jan 02, 2015 5.  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     Ovarian cancer   12/16/2014 Initial Diagnosis Ovarian cancer    Oncology Flowsheet 12/24/2014 12/25/2014 12/26/2014 01/12/2015 01/19/2015 02/07/2015 02/14/2015  Day, Cycle - - - Day 1, Cycle 1 Day 8, Cycle 1 Day 1, Cycle 2 2, Cycle 2  CARBOplatin (PARAPLATIN) IV - - - 540 mg - 450 mg -  dexamethasone (DECADRON) IJ - - - - - - -  dexamethasone (DECADRON) IV - - - [ 12 mg ] - [ 12 mg ] [ 20 mg ]  enoxaparin (LOVENOX) [place] 40 mg 40 mg 40 mg - - - -  fosaprepitant (EMEND) IV - - - [ 150 mg ] - [ 150 mg ] -  ondansetron (ZOFRAN) IV - - - - - - [ 8 mg ]  PACLitaxel (TAXOL) IV - - - 80 mg/m2 80 mg/m2 80 mg/m2 80 mg/m2  palonosetron (ALOXI) IV - - - 0.25 mg - 0.25 mg -  scopolamine 1 MG/3DAYS (TRANSDERM-SCOP) TD - - - - - - -    INTERVAL HISTORY: 74 year old lady with a history of carcinoma of ovary status post bilateral salpingo-oophorectomy on May 4.  Patient is here for  ongoing evaluation gradually recovering from surgery had a port placement.  Appetite has been improving.  No nausea.  No vomiting.  Somewhat depressed and anxious here for evaluation fashion February 14, 2015 Patient is here for reevaluation and continuation of chemotherapy.  Had 2 or 3 days of nausea after her last treatment.  No tingling numbness and soreness in the mouth.  Appetite has been stable. Marland Kitchen REVIEW OF SYSTEMS:   GENERAL:  Feels good.  Active.  No fevers, sweats or weight loss. PERFORMANCE STATUS (ECOG):  0 HEENT:  No visual changes, runny nose, sore throat, mouth sores or tenderness. Lungs: No shortness of breath or cough.  No hemoptysis. Cardiac:  No chest pain, palpitations, orthopnea, or PND. GI: Mild nausea.  Constipation.  No vomiting.  No diarrhea.  GU:  No urgency, frequency, dysuria, or hematuria. Musculoskeletal:  No back pain.  No joint pain.  No muscle tenderness. Extremities:  No pain or swelling. Skin:  No rashes or skin changes. Neuro:  No headache, numbness or weakness, balance or coordination issues. Endocrine:  No diabetes, thyroid issues, hot flashes or night sweats. Psych:  No mood changes, depression or anxiety. Pain:  No focal pain. Review of systems:  All other systems reviewed and found to be negative.  As per HPI. Otherwise, a complete review of systems is  negatve.  PAST MEDICAL HISTORY: Past Medical History  Diagnosis Date  . Atrial fibrillation   . Hyperlipidemia   . PVC (premature ventricular contraction)   . Mitral insufficiency   . GERD (gastroesophageal reflux disease)   . Cancer of breast     Left  . Hypothyroidism   . Ovarian cancer     s/p BSO optimal tumor debulking May 2016    PAST SURGICAL HISTORY: Past Surgical History  Procedure Laterality Date  . Cesarean section      x2  . Abdominal hysterectomy    . Cholecystectomy    . Colonoscopy  2006  . Breast surgery      Left  . Laparotomy with staging N/A 12/22/2014    Procedure:  LAPAROTOMY WITH STAGING;  Surgeon: Gillis Ends, MD;  Location: [facility];  Service: Gynecology;  Laterality: N/A;  . Salpingoophorectomy Bilateral 12/22/2014    Procedure: SALPINGO OOPHORECTOMY;  Surgeon: Gillis Ends, MD;  Location: [facility];  Service: Gynecology;  Laterality: Bilateral;  . Debulking N/A 12/22/2014    Procedure: DEBULKING;  Surgeon: Gillis Ends, MD;  Location: [facility];  Service: Gynecology;  Laterality: N/A;  . Laparotomy N/A 12/22/2014    Procedure: EXPLORATORY LAPAROTOMY;  Surgeon: Robert Bellow, MD;  Location: [facility];  Service: General;  Laterality: N/A;  . Portacath placement Right 12/22/2014    Procedure: INSERTION PORT-A-CATH;  Surgeon: Robert Bellow, MD;  Location: [facility];  Service: General;  Laterality: Right;  . Peripheral vascular catheterization Left 12/22/2014    Procedure: PORTA CATH INSERTION;  Surgeon: Robert Bellow, MD;  Location: [facility];  Service: General;  Laterality: Left;  . Bilateral salpingoophorectomy  May 2016    with optimal tumor debulking     FAMILY HISTORY Family History  Problem Relation Age of Onset  .       GYNECOLOGIC HISTORY:  No LMP recorded. Patient is postmenopausal.     ADVANCED DIRECTIVES: Patient does not have any advanced healthcare directive. Information has been given.   HEALTH MAINTENANCE: History  Substance Use Topics  . Smoking status: Never Smoker   . Smokeless tobacco: Never Used  . Alcohol Use: No      Allergies  Allergen Reactions  . Carafate [Sucralfate] Rash  . Sulfa Antibiotics Rash    Current Outpatient Prescriptions  Medication Sig Dispense Refill  . acetaminophen (TYLENOL) 500 MG chewable tablet Chew 500 mg by mouth every 6 (six) hours as needed for pain.    Marland Kitchen docusate sodium (COLACE) 50 MG capsule Take 50 mg by mouth 2 (two) times daily.    Marland Kitchen levothyroxine (SYNTHROID, LEVOTHROID) 50 MCG tablet Take 50 mcg by mouth daily before breakfast.    .  lidocaine-prilocaine (EMLA) cream Apply 1 application topically as needed. 30 g 0  . LORazepam (ATIVAN) 0.5 MG tablet Take 1 tablet (0.5 mg total) by mouth at bedtime. 30 tablet 3  . ondansetron (ZOFRAN) 4 MG tablet Take 1 tablet (4 mg total) by mouth every 6 (six) hours as needed for nausea or vomiting. 30 tablet 3  . pantoprazole (PROTONIX) 40 MG tablet Take 40 mg by mouth daily.    . polyethylene glycol (MIRALAX / GLYCOLAX) packet Take 17 g by mouth daily.    . propafenone (RYTHMOL) 225 MG tablet Take 225 mg by mouth 2 (two) times daily.     Current Facility-Administered Medications  Medication Dose Route Frequency Provider Last Rate Last Dose  . heparin lock flush 100 unit/mL  500 Units Intravenous Once Forest Gleason, MD      . sodium chloride 0.9 % injection 10 mL  10 mL Intravenous PRN Forest Gleason, MD   10 mL at 02/14/15 1025   Facility-Administered Medications Ordered in Other Visits  Medication Dose Route Frequency Provider Last Rate Last Dose  . sodium chloride 0.9 % injection 10 mL  10 mL Intracatheter PRN Forest Gleason, MD   10 mL at 02/07/15 0930    OBJECTIVE:  Filed Vitals:   02/14/15 0847  BP: 121/74  Pulse: 83  Temp: 96.2 F (35.7 C)     Body mass index is 30.86 kg/(m^2).    ECOG FS:0 - Asymptomatic  PHYSICAL EXAM: GENERAL:  Well developed, well nourished, sitting comfortably in the exam room in no acute distress.  She is slightly apprehensive and somewhat depressed MENTAL STATUS:  Alert and oriented to person, place and time. HEAD:    Normocephalic, atraumatic, face symmetric, no Cushingoid features. EYES:  Pupils equal round and reactive to light and accomodation.  No conjunctivitis or scleral icterus. ENT:  Oropharynx clear without lesion.  Tongue normal. Mucous membranes moist.  RESPIRATORY:  Clear to auscultation without rales, wheezes or rhonchi. CARDIOVASCULAR:  Regular rate and rhythm without murmur, rub or gallop.  ABDOMEN:  Soft, non-tender, with active  bowel sounds, and no hepatosplenomegaly.  No masses. Abdominal wound is healing well. BACK:  No CVA tenderness.  No tenderness on percussion of the back or rib cage. SKIN:  No rashes, ulcers or lesions. EXTREMITIES: No edema, no skin discoloration or tenderness.  No palpable cords. LYMPH NODES: No palpable cervical, supraclavicular, axillary or inguinal adenopathy  NEUROLOGICAL: Unremarkable. PSYCH:  Appropriate. port site is within normal limit  LAB RESULTS:  Office Visit on 02/14/2015  Component Date Value Ref Range Status  . WBC 02/14/2015 4.8  3.6 - 11.0 K/uL Final   A-LINE DRAW  . RBC 02/14/2015 4.28  3.80 - 5.20 MIL/uL Final  . Hemoglobin 02/14/2015 12.9  12.0 - 16.0 g/dL Final  . HCT 02/14/2015 39.0  35.0 - 47.0 % Final  . MCV 02/14/2015 91.3  80.0 - 100.0 fL Final  . MCH 02/14/2015 30.3  26.0 - 34.0 pg Final  . MCHC 02/14/2015 33.1  32.0 - 36.0 g/dL Final  . RDW 02/14/2015 13.8  11.5 - 14.5 % Final  . Platelets 02/14/2015 179  150 - 440 K/uL Final  . Neutrophils Relative % 02/14/2015 41   Final  . Neutro Abs 02/14/2015 2.0  1.4 - 6.5 K/uL Final  . Lymphocytes Relative 02/14/2015 50   Final  . Lymphs Abs 02/14/2015 2.4  1.0 - 3.6 K/uL Final  . Monocytes Relative 02/14/2015 5   Final  . Monocytes Absolute 02/14/2015 0.3  0.2 - 0.9 K/uL Final  . Eosinophils Relative 02/14/2015 3   Final  . Eosinophils Absolute 02/14/2015 0.1  0 - 0.7 K/uL Final  . Basophils Relative 02/14/2015 1   Final  . Basophils Absolute 02/14/2015 0.0  0 - 0.1 K/uL Final  . Sodium 02/14/2015 137  135 - 145 mmol/L Final  . Potassium 02/14/2015 3.8  3.5 - 5.1 mmol/L Final  . Chloride 02/14/2015 105  101 - 111 mmol/L Final  . CO2 02/14/2015 27  22 - 32 mmol/L Final  . Glucose, Bld 02/14/2015 99  65 - 99 mg/dL Final  . BUN 02/14/2015 17  6 - 20 mg/dL Final  . Creatinine, Ser 02/14/2015 0.83  0.44 - 1.00 mg/dL Final  .  Calcium 02/14/2015 9.0  8.9 - 10.3 mg/dL Final  . Total Protein 02/14/2015 6.6  6.5 -  8.1 g/dL Final  . Albumin 02/14/2015 3.6  3.5 - 5.0 g/dL Final  . AST 02/14/2015 16  15 - 41 U/L Final  . ALT 02/14/2015 14  14 - 54 U/L Final  . Alkaline Phosphatase 02/14/2015 68  38 - 126 U/L Final  . Total Bilirubin 02/14/2015 0.5  0.3 - 1.2 mg/dL Final  . GFR calc non Af Amer 02/14/2015 >60  >60 mL/min Final  . GFR calc Af Amer 02/14/2015 >60  >60 mL/min Final   Comment: (NOTE) The eGFR has been calculated using the CKD EPI equation. This calculation has not been validated in all clinical situations. eGFR's persistently <60 mL/min signify possible Chronic Kidney Disease.   . Anion gap 02/14/2015 5  5 - 15 Final    No results found for: LABCA2 No results found for: CA199 No results found for: CEA No results found for: PSA Lab Results  Component Value Date   CA125 2354.0* 12/08/2014       ASSESSMENT: Carcinoma of ovary stage III disease status post bilateral salpingo-oophorectomy and omentectomy with optimal debulking surgery. Starting adjuvant chemotherapy 2.  Patient does have high risk genetic mutation   With   ATM.  Patient's daughter who lesions seen [facility] had received a copy and she is going for take it with her gynecologist.  Her sister lives in different part of the [place] and patient is going to contact and send copy of the result to be taken to her primary care physician.  We offered her here genetic counseling. Down the road if needed patient will contact genetic counseling locally.   MEDICAL DECISION MAKING:   Lab data has been reviewed. Continue chemotherapy  with weekly Taxol Constipation issue has been addressed. Genetic counseling has been done.  May need MRI scan of breasts for follow-up  chemotherapy Patient expressed understanding and was in agreement with this plan. She also understands that She can call clinic at any time with any questions, concerns, or complaints.    Ovarian cancer   Staging form: Ovary, AJCC 7th  Edition     Clinical: Stage IIIC (T3c, N0, M0) - Marni Griffon, MD   02/14/2015 9:49 PM

## 2015-02-14 NOTE — Progress Notes (Signed)
Patient does have living will. Never smoked. 

## 2015-02-22 ENCOUNTER — Inpatient Hospital Stay: Payer: PPO | Attending: Oncology

## 2015-02-22 ENCOUNTER — Inpatient Hospital Stay: Payer: PPO

## 2015-02-22 DIAGNOSIS — Z87891 Personal history of nicotine dependence: Secondary | ICD-10-CM | POA: Insufficient documentation

## 2015-02-22 DIAGNOSIS — Z90722 Acquired absence of ovaries, bilateral: Secondary | ICD-10-CM | POA: Insufficient documentation

## 2015-02-22 DIAGNOSIS — E039 Hypothyroidism, unspecified: Secondary | ICD-10-CM | POA: Diagnosis not present

## 2015-02-22 DIAGNOSIS — Z5111 Encounter for antineoplastic chemotherapy: Secondary | ICD-10-CM | POA: Insufficient documentation

## 2015-02-22 DIAGNOSIS — C562 Malignant neoplasm of left ovary: Secondary | ICD-10-CM

## 2015-02-22 DIAGNOSIS — I340 Nonrheumatic mitral (valve) insufficiency: Secondary | ICD-10-CM | POA: Diagnosis not present

## 2015-02-22 DIAGNOSIS — C561 Malignant neoplasm of right ovary: Secondary | ICD-10-CM | POA: Insufficient documentation

## 2015-02-22 DIAGNOSIS — E785 Hyperlipidemia, unspecified: Secondary | ICD-10-CM | POA: Insufficient documentation

## 2015-02-22 DIAGNOSIS — Z79899 Other long term (current) drug therapy: Secondary | ICD-10-CM | POA: Diagnosis not present

## 2015-02-22 DIAGNOSIS — I4891 Unspecified atrial fibrillation: Secondary | ICD-10-CM | POA: Diagnosis not present

## 2015-02-22 DIAGNOSIS — K219 Gastro-esophageal reflux disease without esophagitis: Secondary | ICD-10-CM | POA: Diagnosis not present

## 2015-02-22 DIAGNOSIS — Z853 Personal history of malignant neoplasm of breast: Secondary | ICD-10-CM | POA: Insufficient documentation

## 2015-02-22 DIAGNOSIS — Z9223 Personal history of estrogen therapy: Secondary | ICD-10-CM | POA: Diagnosis not present

## 2015-02-22 DIAGNOSIS — R5383 Other fatigue: Secondary | ICD-10-CM | POA: Diagnosis not present

## 2015-02-22 DIAGNOSIS — Z90710 Acquired absence of both cervix and uterus: Secondary | ICD-10-CM | POA: Diagnosis not present

## 2015-02-22 DIAGNOSIS — R531 Weakness: Secondary | ICD-10-CM | POA: Diagnosis not present

## 2015-02-22 LAB — CBC WITH DIFFERENTIAL/PLATELET
Basophils Absolute: 0 10*3/uL (ref 0–0.1)
Basophils Relative: 1 %
EOS PCT: 1 %
Eosinophils Absolute: 0 10*3/uL (ref 0–0.7)
HEMATOCRIT: 38 % (ref 35.0–47.0)
Hemoglobin: 12.6 g/dL (ref 12.0–16.0)
Lymphocytes Relative: 69 %
Lymphs Abs: 2.5 10*3/uL (ref 1.0–3.6)
MCH: 30.4 pg (ref 26.0–34.0)
MCHC: 33.1 g/dL (ref 32.0–36.0)
MCV: 91.8 fL (ref 80.0–100.0)
MONO ABS: 0.4 10*3/uL (ref 0.2–0.9)
Monocytes Relative: 11 %
Neutro Abs: 0.6 10*3/uL — ABNORMAL LOW (ref 1.4–6.5)
Neutrophils Relative %: 18 %
Platelets: 242 10*3/uL (ref 150–440)
RBC: 4.14 MIL/uL (ref 3.80–5.20)
RDW: 14.1 % (ref 11.5–14.5)
WBC: 3.6 10*3/uL (ref 3.6–11.0)

## 2015-02-22 LAB — COMPREHENSIVE METABOLIC PANEL
ALBUMIN: 3.5 g/dL (ref 3.5–5.0)
ALT: 13 U/L — ABNORMAL LOW (ref 14–54)
ANION GAP: 7 (ref 5–15)
AST: 17 U/L (ref 15–41)
Alkaline Phosphatase: 61 U/L (ref 38–126)
BUN: 17 mg/dL (ref 6–20)
CALCIUM: 8.6 mg/dL — AB (ref 8.9–10.3)
CO2: 24 mmol/L (ref 22–32)
CREATININE: 0.92 mg/dL (ref 0.44–1.00)
Chloride: 104 mmol/L (ref 101–111)
GFR calc Af Amer: >60 mL/min (ref >60)
GFR calc non Af Amer: >60 mL/min (ref >60)
Glucose, Bld: 91 mg/dL (ref 65–99)
Potassium: 3.4 mmol/L — ABNORMAL LOW (ref 3.5–5.1)
SODIUM: 135 mmol/L (ref 135–145)
Total Bilirubin: 0.5 mg/dL (ref 0.3–1.2)
Total Protein: 6.3 g/dL — ABNORMAL LOW (ref 6.5–8.1)

## 2015-02-22 MED ORDER — LEVOFLOXACIN 500 MG PO TABS: 500.0000 mg | ORAL_TABLET | Freq: Every day | ORAL | Status: DC

## 2015-02-22 MED ORDER — HEPARIN SOD (PORK) LOCK FLUSH 100 UNIT/ML IV SOLN
INTRAVENOUS | Status: AC
  Filled 2015-02-22: qty 5

## 2015-02-25 ENCOUNTER — Other Ambulatory Visit: Payer: Self-pay | Admitting: *Deleted

## 2015-02-25 DIAGNOSIS — C569 Malignant neoplasm of unspecified ovary: Secondary | ICD-10-CM

## 2015-02-28 ENCOUNTER — Encounter: Payer: Self-pay | Admitting: Oncology

## 2015-02-28 ENCOUNTER — Inpatient Hospital Stay (HOSPITAL_BASED_OUTPATIENT_CLINIC_OR_DEPARTMENT_OTHER): Payer: PPO | Admitting: Oncology

## 2015-02-28 ENCOUNTER — Inpatient Hospital Stay (HOSPITAL_BASED_OUTPATIENT_CLINIC_OR_DEPARTMENT_OTHER): Payer: PPO

## 2015-02-28 ENCOUNTER — Inpatient Hospital Stay: Payer: PPO

## 2015-02-28 ENCOUNTER — Telehealth: Payer: Self-pay

## 2015-02-28 VITALS — BP 136/84 | HR 62 | Temp 95.7°F | Wt 175.5 lb

## 2015-02-28 DIAGNOSIS — Z853 Personal history of malignant neoplasm of breast: Secondary | ICD-10-CM

## 2015-02-28 DIAGNOSIS — Z9223 Personal history of estrogen therapy: Secondary | ICD-10-CM

## 2015-02-28 DIAGNOSIS — C562 Malignant neoplasm of left ovary: Secondary | ICD-10-CM

## 2015-02-28 DIAGNOSIS — C569 Malignant neoplasm of unspecified ovary: Secondary | ICD-10-CM

## 2015-02-28 DIAGNOSIS — C561 Malignant neoplasm of right ovary: Secondary | ICD-10-CM

## 2015-02-28 DIAGNOSIS — E785 Hyperlipidemia, unspecified: Secondary | ICD-10-CM

## 2015-02-28 DIAGNOSIS — Z90710 Acquired absence of both cervix and uterus: Secondary | ICD-10-CM | POA: Diagnosis not present

## 2015-02-28 DIAGNOSIS — I4891 Unspecified atrial fibrillation: Secondary | ICD-10-CM

## 2015-02-28 DIAGNOSIS — I340 Nonrheumatic mitral (valve) insufficiency: Secondary | ICD-10-CM

## 2015-02-28 DIAGNOSIS — K219 Gastro-esophageal reflux disease without esophagitis: Secondary | ICD-10-CM

## 2015-02-28 DIAGNOSIS — E039 Hypothyroidism, unspecified: Secondary | ICD-10-CM

## 2015-02-28 DIAGNOSIS — R531 Weakness: Secondary | ICD-10-CM

## 2015-02-28 DIAGNOSIS — Z90722 Acquired absence of ovaries, bilateral: Secondary | ICD-10-CM | POA: Diagnosis not present

## 2015-02-28 DIAGNOSIS — Z5111 Encounter for antineoplastic chemotherapy: Secondary | ICD-10-CM | POA: Diagnosis not present

## 2015-02-28 DIAGNOSIS — R5383 Other fatigue: Secondary | ICD-10-CM

## 2015-02-28 LAB — COMPREHENSIVE METABOLIC PANEL
ALBUMIN: 3.4 g/dL — AB (ref 3.5–5.0)
ALT: 13 U/L — AB (ref 14–54)
AST: 14 U/L — AB (ref 15–41)
Alkaline Phosphatase: 66 U/L (ref 38–126)
Anion gap: 4 — ABNORMAL LOW (ref 5–15)
BUN: 15 mg/dL (ref 6–20)
CO2: 26 mmol/L (ref 22–32)
Calcium: 8.4 mg/dL — ABNORMAL LOW (ref 8.9–10.3)
Chloride: 105 mmol/L (ref 101–111)
Creatinine, Ser: 0.87 mg/dL (ref 0.44–1.00)
Glucose, Bld: 84 mg/dL (ref 65–99)
Potassium: 3.8 mmol/L (ref 3.5–5.1)
Sodium: 135 mmol/L (ref 135–145)
Total Bilirubin: 0.5 mg/dL (ref 0.3–1.2)
Total Protein: 6.3 g/dL — ABNORMAL LOW (ref 6.5–8.1)

## 2015-02-28 LAB — CBC WITH DIFFERENTIAL/PLATELET
Basophils Absolute: 0 10*3/uL (ref 0–0.1)
Basophils Relative: 1 %
EOS PCT: 0 %
Eosinophils Absolute: 0 10*3/uL (ref 0–0.7)
HCT: 38.4 % (ref 35.0–47.0)
HEMOGLOBIN: 12.6 g/dL (ref 12.0–16.0)
Lymphocytes Relative: 53 %
Lymphs Abs: 2.3 10*3/uL (ref 1.0–3.6)
MCH: 30.3 pg (ref 26.0–34.0)
MCHC: 33 g/dL (ref 32.0–36.0)
MCV: 92 fL (ref 80.0–100.0)
Monocytes Absolute: 0.7 10*3/uL (ref 0.2–0.9)
Monocytes Relative: 15 %
NEUTROS ABS: 1.4 10*3/uL (ref 1.4–6.5)
NEUTROS PCT: 31 %
PLATELETS: 220 10*3/uL (ref 150–440)
RBC: 4.17 MIL/uL (ref 3.80–5.20)
RDW: 14.8 % — AB (ref 11.5–14.5)
WBC: 4.4 10*3/uL (ref 3.6–11.0)

## 2015-02-28 MED ORDER — FOSAPREPITANT DIMEGLUMINE INJECTION 150 MG
Freq: Once | INTRAVENOUS | Status: AC
  Administered 2015-02-28: 12:00:00 via INTRAVENOUS
  Filled 2015-02-28: qty 5

## 2015-02-28 MED ORDER — HEPARIN SOD (PORK) LOCK FLUSH 100 UNIT/ML IV SOLN
500.0000 [IU] | Freq: Once | INTRAVENOUS | Status: AC | PRN
  Administered 2015-02-28: 500 [IU]

## 2015-02-28 MED ORDER — DIPHENHYDRAMINE HCL 50 MG/ML IJ SOLN
50.0000 mg | Freq: Once | INTRAMUSCULAR | Status: AC
  Administered 2015-02-28: 50 mg via INTRAVENOUS
  Filled 2015-02-28: qty 1

## 2015-02-28 MED ORDER — PACLITAXEL CHEMO INJECTION 300 MG/50ML
90.0000 mg/m2 | Freq: Once | INTRAVENOUS | Status: AC
  Administered 2015-02-28: 168 mg via INTRAVENOUS
  Filled 2015-02-28: qty 28

## 2015-02-28 MED ORDER — FAMOTIDINE IN NACL 20-0.9 MG/50ML-% IV SOLN
20.0000 mg | Freq: Once | INTRAVENOUS | Status: AC
  Administered 2015-02-28: 20 mg via INTRAVENOUS
  Filled 2015-02-28: qty 50

## 2015-02-28 MED ORDER — SODIUM CHLORIDE 0.9 % IV SOLN
447.0000 mg | Freq: Once | INTRAVENOUS | Status: AC
  Administered 2015-02-28: 450 mg via INTRAVENOUS
  Filled 2015-02-28: qty 45

## 2015-02-28 MED ORDER — PALONOSETRON HCL INJECTION 0.25 MG/5ML
0.2500 mg | Freq: Once | INTRAVENOUS | Status: AC
  Administered 2015-02-28: 0.25 mg via INTRAVENOUS
  Filled 2015-02-28: qty 5

## 2015-02-28 MED ORDER — SODIUM CHLORIDE 0.9 % IJ SOLN
10.0000 mL | INTRAMUSCULAR | Status: DC | PRN
  Filled 2015-02-28: qty 10

## 2015-02-28 MED ORDER — SODIUM CHLORIDE 0.9 % IV SOLN
Freq: Once | INTRAVENOUS | Status: AC
Start: 2015-02-28 — End: 2015-02-28
  Administered 2015-02-28: 11:00:00 via INTRAVENOUS
  Filled 2015-02-28: qty 1000

## 2015-02-28 MED ORDER — HEPARIN SOD (PORK) LOCK FLUSH 100 UNIT/ML IV SOLN
500.0000 [IU] | Freq: Once | INTRAVENOUS | Status: DC
  Filled 2015-02-28: qty 5

## 2015-02-28 NOTE — Telephone Encounter (Signed)
Paclitaxel increased from 80mg /m2 to 90mg /m2

## 2015-02-28 NOTE — Progress Notes (Signed)
Patient does have living will.  Former smoker. Patient c/o sensitivity around waist line.  Cannot stand her clothes to touch or be tight.

## 2015-02-28 NOTE — Progress Notes (Signed)
[place] @ [facility] [phone]  Fax:(336) [place]: 11-Aug-1941  MR#: [id number]  TSV#:[id number]  Patient Care Team: Jerrol Banana., MD as PCP - General (Family Medicine) [facility] Gaetana Michaelis, MD as Referring Physician (Obstetrics and Gynecology) Robert Bellow, MD as Consulting Physician (General Surgery)  CHIEF COMPLAINT:  Chief Complaint  Patient presents with  . 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 2.   carcinoma  of breast ( left) status post lumpectomy ,adiation  therapyand 5 years of tamoxifen. 3.history of atrial fibrillation being managed by cardiologist 4.  Status post bilateral salpingo-oophorectomy on fourth of May, 2016.  with optimal debulking surgery 5.  Starting chemotherapy with carboplatinum and Taxol in dose dense fashion from Jan 02, 2015 5.  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     Ovarian cancer   12/16/2014 Initial Diagnosis Ovarian cancer    Oncology Flowsheet 12/25/2014 12/26/2014 01/12/2015 01/19/2015 02/07/2015 02/14/2015 02/28/2015  Day, Cycle - - Day 1, Cycle 1 Day 8, Cycle 1 Day 1, Cycle 2 2, Cycle 2 Day 1, Cycle 3  CARBOplatin (PARAPLATIN) IV - - 540 mg - 450 mg - 450 mg  dexamethasone (DECADRON) IJ - - - - - - -  dexamethasone (DECADRON) IV - - [ 12 mg ] - [ 12 mg ] [ 20 mg ] [ 12 mg ]  enoxaparin (LOVENOX) [place] 40 mg 40 mg - - - - -  fosaprepitant (EMEND) IV - - [ 150 mg ] - [ 150 mg ] - [ 150 mg ]  ondansetron (ZOFRAN) IV - - - - - [ 8 mg ] -  PACLitaxel (TAXOL) IV - - 80 mg/m2 80 mg/m2 80 mg/m2 80 mg/m2 90 mg/m2  palonosetron (ALOXI) IV - - 0.25 mg - 0.25 mg - 0.25 mg  scopolamine 1 MG/3DAYS (TRANSDERM-SCOP) TD - - - - - - -    INTERVAL HISTORY: 74 year old lady with a history of ovarian cancer here to initiate next cycle of  chemotherapy.  This is the third cycle of treatment.  Patient cannot get day 15 Taxol treatment because of low blood count.  No chills.  No fever.  No nausea.  No vomiting.  No diarrhea.  Appetite has been stable. REVIEW OF SYSTEMS:    general status: Patient is feeling weak and tired.  No change in a performance status.  No chills.  No fever. HEENT: Alopecia.  No evidence of stomatitis Lungs: No cough or shortness of breath Cardiac: No chest pain or paroxysmal nocturnal dyspnea GI: No nausea no vomiting no diarrhea no abdominal pain Skin: No rash GU: No dysuria or hematuria Lower extremity no swelling Neurological system: No tingling.  No numbness.  No other focal signs Musculoskeletal system no bony pains  As per HPI. Otherwise, a complete review of systems is negatve.  PAST MEDICAL HISTORY: Past Medical History  Diagnosis Date  . Atrial fibrillation   . Hyperlipidemia   . PVC (premature ventricular contraction)   . Mitral insufficiency   . GERD (gastroesophageal reflux disease)   . Cancer of breast     Left  . Hypothyroidism   . Ovarian cancer     s/p BSO optimal tumor debulking May 2016    PAST SURGICAL HISTORY: Past Surgical History  Procedure Laterality Date  . Cesarean  section      x2  . Abdominal hysterectomy    . Cholecystectomy    . Colonoscopy  2006  . Breast surgery      Left  . Laparotomy with staging N/A 12/22/2014    Procedure: LAPAROTOMY WITH STAGING;  Surgeon: Gillis Ends, MD;  Location: [facility] ORS;  Service: Gynecology;  Laterality: N/A;  . Salpingoophorectomy Bilateral 12/22/2014    Procedure: SALPINGO OOPHORECTOMY;  Surgeon: Gillis Ends, MD;  Location: [facility] OR[facility];  Service: Gynecology;  Laterality: Bilateral;  . Debulking N/A 12/22/2014    Procedure: DEBULKING;  Surgeon: Gillis Ends, MD;  Location: [facility] ORS;  Service: Gynecology;  Laterality: N/A;  . Laparotomy N/A 12/22/2014    Procedure: EXPLORATORY LAPAROTOMY;  Surgeon:  Robert Bellow, MD;  Location: [facility] OR[facility];  Service: General;  Laterality: N/A;  . Portacath placement Right 12/22/2014    Procedure: INSERTION PORT-A-CATH;  Surgeon: Robert Bellow, MD;  Location: [facility] OR[facility];  Service: General;  Laterality: Right;  . Peripheral vascular catheterization Left 12/22/2014    Procedure: PORTA CATH INSERTION;  Surgeon: Robert Bellow, MD;  Location: [facility] OR[facility];  Service: General;  Laterality: Left;  . Bilateral salpingoophorectomy  May 2016    with optimal tumor debulking     FAMILY HISTORY Family History  Problem Relation Age of Onset  .       ADVANCED DIRECTIVES:  Patient does have advance healthcare directive, Patient   does not desire to make any changes  HEALTH MAINTENANCE: History  Substance Use Topics  . Smoking status: Never Smoker   . Smokeless tobacco: Never Used  . Alcohol Use: No      Allergies  Allergen Reactions  . Carafate [Sucralfate] Rash  . Sulfa Antibiotics Rash    Current Outpatient Prescriptions  Medication Sig Dispense Refill  . acetaminophen (TYLENOL) 500 MG chewable tablet Chew 500 mg by mouth every 6 (six) hours as needed for pain.    Marland Kitchen docusate sodium (COLACE) 50 MG capsule Take 50 mg by mouth 2 (two) times daily.    Marland Kitchen levofloxacin (LEVAQUIN) 500 MG tablet Take 1 tablet (500 mg total) by mouth daily. 5 tablet 0  . levothyroxine (SYNTHROID, LEVOTHROID) 50 MCG tablet Take 50 mcg by mouth daily before breakfast.    . lidocaine-prilocaine (EMLA) cream Apply 1 application topically as needed. 30 g 0  . LORazepam (ATIVAN) 0.5 MG tablet Take 1 tablet (0.5 mg total) by mouth at bedtime. 30 tablet 3  . ondansetron (ZOFRAN) 4 MG tablet Take 1 tablet (4 mg total) by mouth every 6 (six) hours as needed for nausea or vomiting. 30 tablet 3  . pantoprazole (PROTONIX) 40 MG tablet Take 40 mg by mouth daily.    . polyethylene glycol (MIRALAX / GLYCOLAX) packet Take 17 g by mouth daily.    . propafenone (RYTHMOL) 225 MG tablet Take 225  mg by mouth 2 (two) times daily.     No current facility-administered medications for this visit.   Facility-Administered Medications Ordered in Other Visits  Medication Dose Route Frequency Provider Last Rate Last Dose  . heparin lock flush 100 unit/mL  500 Units Intravenous Once Forest Gleason, MD      . sodium chloride 0.9 % injection 10 mL  10 mL Intracatheter PRN Forest Gleason, MD   10 mL at 02/07/15 0930  . sodium chloride 0.9 % injection 10 mL  10 mL Intravenous PRN Forest Gleason, MD        OBJECTIVE:  Filed Vitals:   02/28/15 0952  BP: 136/84  Pulse: 62  Temp: 95.7 F (35.4 C)     Body mass index is 31.09 kg/(m^2).    ECOG FS:1 - Symptomatic but completely ambulatory  PHYSICAL EXAM: GENERAL:  Well developed, well nourished, sitting comfortably in the exam room in no acute distress. MENTAL STATUS:  Alert and oriented to person, place and time. HEAD:   ALOPECIA  Normocephalic, atraumatic, face symmetric, no Cushingoid features. EYES:  Pupils equal round and reactive to light and accomodation.  No conjunctivitis or scleral icterus. ENT:  Oropharynx clear without lesion.  Tongue normal. Mucous membranes moist.  RESPIRATORY:  Clear to auscultation without rales, wheezes or rhonchi. CARDIOVASCULAR:  Regular rate and rhythm without murmur, rub or gallop. BREAST:  Right breast without masses, skin changes or nipple discharge.  Left breast without masses, skin changes or nipple discharge. ABDOMEN:  Soft, non-tender, with active bowel sounds, and no hepatosplenomegaly.  No masses. BACK:  No CVA tenderness.  No tenderness on percussion of the back or rib cage. SKIN:  No rashes, ulcers or lesions. EXTREMITIES: No edema, no skin discoloration or tenderness.  No palpable cords. LYMPH NODES: No palpable cervical, supraclavicular, axillary or inguinal adenopathy  NEUROLOGICAL: Unremarkable. PSYCH:  Appropriate.   LAB RESULTS:  Infusion on 02/28/2015  Component Date Value Ref Range  Status  . WBC 02/28/2015 4.4  3.6 - 11.0 K/uL Final   A-LINE DRAW  . RBC 02/28/2015 4.17  3.80 - 5.20 MIL/uL Final  . Hemoglobin 02/28/2015 12.6  12.0 - 16.0 g/dL Final  . HCT 02/28/2015 38.4  35.0 - 47.0 % Final  . MCV 02/28/2015 92.0  80.0 - 100.0 fL Final  . MCH 02/28/2015 30.3  26.0 - 34.0 pg Final  . MCHC 02/28/2015 33.0  32.0 - 36.0 g/dL Final  . RDW 02/28/2015 14.8* 11.5 - 14.5 % Final  . Platelets 02/28/2015 220  150 - 440 K/uL Final  . Neutrophils Relative % 02/28/2015 31   Final  . Neutro Abs 02/28/2015 1.4  1.4 - 6.5 K/uL Final  . Lymphocytes Relative 02/28/2015 53   Final  . Lymphs Abs 02/28/2015 2.3  1.0 - 3.6 K/uL Final  . Monocytes Relative 02/28/2015 15   Final  . Monocytes Absolute 02/28/2015 0.7  0.2 - 0.9 K/uL Final  . Eosinophils Relative 02/28/2015 0   Final  . Eosinophils Absolute 02/28/2015 0.0  0 - 0.7 K/uL Final  . Basophils Relative 02/28/2015 1   Final  . Basophils Absolute 02/28/2015 0.0  0 - 0.1 K/uL Final  . Sodium 02/28/2015 135  135 - 145 mmol/L Final  . Potassium 02/28/2015 3.8  3.5 - 5.1 mmol/L Final  . Chloride 02/28/2015 105  101 - 111 mmol/L Final  . CO2 02/28/2015 26  22 - 32 mmol/L Final  . Glucose, Bld 02/28/2015 84  65 - 99 mg/dL Final  . BUN 02/28/2015 15  6 - 20 mg/dL Final  . Creatinine, Ser 02/28/2015 0.87  0.44 - 1.00 mg/dL Final  . Calcium 02/28/2015 8.4* 8.9 - 10.3 mg/dL Final  . Total Protein 02/28/2015 6.3* 6.5 - 8.1 g/dL Final  . Albumin 02/28/2015 3.4* 3.5 - 5.0 g/dL Final  . AST 02/28/2015 14* 15 - 41 U/L Final  . ALT 02/28/2015 13* 14 - 54 U/L Final  . Alkaline Phosphatase 02/28/2015 66  38 - 126 U/L Final  . Total Bilirubin 02/28/2015 0.5  0.3 - 1.2 mg/dL Final  . GFR calc non  Af Amer 02/28/2015 >60  >60 mL/min Final  . GFR calc Af Amer 02/28/2015 >60  >60 mL/min Final   Comment: (NOTE) The eGFR has been calculated using the CKD EPI equation. This calculation has not been validated in all clinical situations. eGFR's  persistently <60 mL/min signify possible Chronic Kidney Disease.   . Anion gap 02/28/2015 4* 5 - 15 Final      ASSESSMENT: OVARIAN  cancer stage III Continue chemotherapy with carboplatinum and Taxol.  Nose of Taxol was increased to 90 mg/m and scheduled be changed to day 1 and day 8 Taxol chemotherapy program   MEDICAL DECISION MAKING:  All lab data has been reviewed.  Continue chemotherapy.  Patient expressed understanding and was in agreement with this plan. She also understands that She can call clinic at any time with any questions, concerns, or complaints.    Ovarian cancer   Staging form: Ovary, AJCC 7th Edition     Clinical: Stage IIIC (T3c, N0, M0) - Unsigned   Forest Gleason, MD   02/28/2015 2:48 PM

## 2015-03-07 ENCOUNTER — Inpatient Hospital Stay: Payer: PPO

## 2015-03-07 VITALS — BP 111/71 | HR 65 | Temp 95.9°F

## 2015-03-07 DIAGNOSIS — Z5111 Encounter for antineoplastic chemotherapy: Secondary | ICD-10-CM | POA: Diagnosis not present

## 2015-03-07 DIAGNOSIS — C562 Malignant neoplasm of left ovary: Secondary | ICD-10-CM

## 2015-03-07 LAB — CBC WITH DIFFERENTIAL/PLATELET
BASOS ABS: 0 10*3/uL (ref 0–0.1)
Basophils Relative: 1 %
EOS PCT: 1 %
Eosinophils Absolute: 0 10*3/uL (ref 0–0.7)
HEMATOCRIT: 36.3 % (ref 35.0–47.0)
HEMOGLOBIN: 11.9 g/dL — AB (ref 12.0–16.0)
LYMPHS ABS: 2.2 10*3/uL (ref 1.0–3.6)
Lymphocytes Relative: 53 %
MCH: 30.2 pg (ref 26.0–34.0)
MCHC: 32.8 g/dL (ref 32.0–36.0)
MCV: 92.1 fL (ref 80.0–100.0)
Monocytes Absolute: 0.3 10*3/uL (ref 0.2–0.9)
Monocytes Relative: 7 %
Neutro Abs: 1.5 10*3/uL (ref 1.4–6.5)
Neutrophils Relative %: 38 %
PLATELETS: 201 10*3/uL (ref 150–440)
RBC: 3.94 MIL/uL (ref 3.80–5.20)
RDW: 14.7 % — AB (ref 11.5–14.5)
WBC: 4 10*3/uL (ref 3.6–11.0)

## 2015-03-07 LAB — COMPREHENSIVE METABOLIC PANEL
ALBUMIN: 3.5 g/dL (ref 3.5–5.0)
ALT: 15 U/L (ref 14–54)
AST: 18 U/L (ref 15–41)
Alkaline Phosphatase: 65 U/L (ref 38–126)
Anion gap: 5 (ref 5–15)
BUN: 16 mg/dL (ref 6–20)
CALCIUM: 8.5 mg/dL — AB (ref 8.9–10.3)
CO2: 26 mmol/L (ref 22–32)
Chloride: 105 mmol/L (ref 101–111)
Creatinine, Ser: 0.94 mg/dL (ref 0.44–1.00)
GFR, EST NON AFRICAN AMERICAN: 60 mL/min — AB (ref >60)
GLUCOSE: 133 mg/dL — AB (ref 65–99)
POTASSIUM: 4 mmol/L (ref 3.5–5.1)
SODIUM: 136 mmol/L (ref 135–145)
TOTAL PROTEIN: 6.3 g/dL — AB (ref 6.5–8.1)
Total Bilirubin: 0.5 mg/dL (ref 0.3–1.2)

## 2015-03-07 MED ORDER — HEPARIN SOD (PORK) LOCK FLUSH 100 UNIT/ML IV SOLN
500.0000 [IU] | Freq: Once | INTRAVENOUS | Status: AC
  Administered 2015-03-07: 500 [IU] via INTRAVENOUS

## 2015-03-07 MED ORDER — FAMOTIDINE IN NACL 20-0.9 MG/50ML-% IV SOLN
20.0000 mg | Freq: Once | INTRAVENOUS | Status: AC
  Administered 2015-03-07: 20 mg via INTRAVENOUS
  Filled 2015-03-07: qty 50

## 2015-03-07 MED ORDER — SODIUM CHLORIDE 0.9 % IV SOLN
Freq: Once | INTRAVENOUS | Status: AC
  Administered 2015-03-07: 15:00:00 via INTRAVENOUS
  Filled 2015-03-07: qty 4

## 2015-03-07 MED ORDER — PACLITAXEL CHEMO INJECTION 300 MG/50ML
90.0000 mg/m2 | Freq: Once | INTRAVENOUS | Status: AC
  Administered 2015-03-07: 168 mg via INTRAVENOUS
  Filled 2015-03-07: qty 28

## 2015-03-07 MED ORDER — SODIUM CHLORIDE 0.9 % IV SOLN
Freq: Once | INTRAVENOUS | Status: AC
  Administered 2015-03-07: 14:00:00 via INTRAVENOUS
  Filled 2015-03-07: qty 1000

## 2015-03-07 MED ORDER — DIPHENHYDRAMINE HCL 50 MG/ML IJ SOLN
50.0000 mg | Freq: Once | INTRAMUSCULAR | Status: AC
  Administered 2015-03-07: 50 mg via INTRAVENOUS
  Filled 2015-03-07: qty 1

## 2015-03-14 ENCOUNTER — Inpatient Hospital Stay: Payer: PPO

## 2015-03-14 ENCOUNTER — Telehealth: Payer: Self-pay | Admitting: *Deleted

## 2015-03-14 DIAGNOSIS — C569 Malignant neoplasm of unspecified ovary: Secondary | ICD-10-CM

## 2015-03-14 DIAGNOSIS — C562 Malignant neoplasm of left ovary: Secondary | ICD-10-CM

## 2015-03-14 DIAGNOSIS — Z5111 Encounter for antineoplastic chemotherapy: Secondary | ICD-10-CM | POA: Diagnosis not present

## 2015-03-14 LAB — CBC WITH DIFFERENTIAL/PLATELET
BASOS ABS: 0 10*3/uL (ref 0–0.1)
Basophils Relative: 0 %
EOS PCT: 0 %
Eosinophils Absolute: 0 10*3/uL (ref 0–0.7)
HCT: 34.3 % — ABNORMAL LOW (ref 35.0–47.0)
Hemoglobin: 11.3 g/dL — ABNORMAL LOW (ref 12.0–16.0)
LYMPHS PCT: 77 %
Lymphs Abs: 2.6 10*3/uL (ref 1.0–3.6)
MCH: 30.7 pg (ref 26.0–34.0)
MCHC: 33 g/dL (ref 32.0–36.0)
MCV: 93.1 fL (ref 80.0–100.0)
MONO ABS: 0.3 10*3/uL (ref 0.2–0.9)
MONOS PCT: 10 %
NEUTROS ABS: 0.4 10*3/uL — AB (ref 1.4–6.5)
NEUTROS PCT: 13 %
PLATELETS: 232 10*3/uL (ref 150–440)
RBC: 3.69 MIL/uL — ABNORMAL LOW (ref 3.80–5.20)
RDW: 15.2 % — ABNORMAL HIGH (ref 11.5–14.5)
WBC: 3.4 10*3/uL — ABNORMAL LOW (ref 3.6–11.0)

## 2015-03-14 MED ORDER — LEVOFLOXACIN 500 MG PO TABS: 500.0000 mg | ORAL_TABLET | Freq: Every day | ORAL | Status: DC

## 2015-03-14 NOTE — Telephone Encounter (Signed)
Called patient to inform her that counts are low today and MD has called Levaquin for 5 days.  She should pick up from pharmacy and start it today.  Patient instructed to call if she develops any fever.

## 2015-03-14 NOTE — Telephone Encounter (Signed)
Low blood count today. rx for levaquin 500mg  for 5 days called into pharmacy.

## 2015-03-15 LAB — CA 125: CA 125: 9.8 U/mL (ref 0.0–38.1)

## 2015-03-21 ENCOUNTER — Inpatient Hospital Stay: Payer: PPO | Attending: Oncology

## 2015-03-21 ENCOUNTER — Inpatient Hospital Stay: Payer: PPO

## 2015-03-21 ENCOUNTER — Inpatient Hospital Stay (HOSPITAL_BASED_OUTPATIENT_CLINIC_OR_DEPARTMENT_OTHER): Payer: PPO | Admitting: Oncology

## 2015-03-21 ENCOUNTER — Encounter: Payer: Self-pay | Admitting: Oncology

## 2015-03-21 VITALS — BP 138/81 | HR 83 | Temp 96.3°F | Wt 175.9 lb

## 2015-03-21 DIAGNOSIS — Z9223 Personal history of estrogen therapy: Secondary | ICD-10-CM | POA: Insufficient documentation

## 2015-03-21 DIAGNOSIS — K219 Gastro-esophageal reflux disease without esophagitis: Secondary | ICD-10-CM | POA: Diagnosis not present

## 2015-03-21 DIAGNOSIS — Z90722 Acquired absence of ovaries, bilateral: Secondary | ICD-10-CM

## 2015-03-21 DIAGNOSIS — Z5111 Encounter for antineoplastic chemotherapy: Secondary | ICD-10-CM | POA: Diagnosis not present

## 2015-03-21 DIAGNOSIS — R5383 Other fatigue: Secondary | ICD-10-CM | POA: Insufficient documentation

## 2015-03-21 DIAGNOSIS — Z923 Personal history of irradiation: Secondary | ICD-10-CM

## 2015-03-21 DIAGNOSIS — C561 Malignant neoplasm of right ovary: Secondary | ICD-10-CM | POA: Insufficient documentation

## 2015-03-21 DIAGNOSIS — C562 Malignant neoplasm of left ovary: Secondary | ICD-10-CM

## 2015-03-21 DIAGNOSIS — R531 Weakness: Secondary | ICD-10-CM

## 2015-03-21 DIAGNOSIS — T451X5S Adverse effect of antineoplastic and immunosuppressive drugs, sequela: Secondary | ICD-10-CM | POA: Insufficient documentation

## 2015-03-21 DIAGNOSIS — E701 Other hyperphenylalaninemias: Secondary | ICD-10-CM

## 2015-03-21 DIAGNOSIS — E785 Hyperlipidemia, unspecified: Secondary | ICD-10-CM | POA: Insufficient documentation

## 2015-03-21 DIAGNOSIS — L659 Nonscarring hair loss, unspecified: Secondary | ICD-10-CM | POA: Diagnosis not present

## 2015-03-21 DIAGNOSIS — R040 Epistaxis: Secondary | ICD-10-CM

## 2015-03-21 DIAGNOSIS — I4891 Unspecified atrial fibrillation: Secondary | ICD-10-CM

## 2015-03-21 DIAGNOSIS — Z853 Personal history of malignant neoplasm of breast: Secondary | ICD-10-CM | POA: Diagnosis not present

## 2015-03-21 DIAGNOSIS — D701 Agranulocytosis secondary to cancer chemotherapy: Secondary | ICD-10-CM | POA: Diagnosis not present

## 2015-03-21 DIAGNOSIS — Z79899 Other long term (current) drug therapy: Secondary | ICD-10-CM | POA: Diagnosis not present

## 2015-03-21 DIAGNOSIS — C569 Malignant neoplasm of unspecified ovary: Secondary | ICD-10-CM

## 2015-03-21 DIAGNOSIS — E039 Hypothyroidism, unspecified: Secondary | ICD-10-CM | POA: Insufficient documentation

## 2015-03-21 LAB — CBC WITH DIFFERENTIAL/PLATELET
Basophils Absolute: 0 10*3/uL (ref 0–0.1)
Basophils Relative: 1 %
EOS ABS: 0 10*3/uL (ref 0–0.7)
Eosinophils Relative: 0 %
HCT: 37.1 % (ref 35.0–47.0)
Hemoglobin: 12.4 g/dL (ref 12.0–16.0)
LYMPHS ABS: 2.2 10*3/uL (ref 1.0–3.6)
Lymphocytes Relative: 55 %
MCH: 31 pg (ref 26.0–34.0)
MCHC: 33.3 g/dL (ref 32.0–36.0)
MCV: 93.1 fL (ref 80.0–100.0)
MONO ABS: 0.6 10*3/uL (ref 0.2–0.9)
Monocytes Relative: 15 %
NEUTROS PCT: 29 %
Neutro Abs: 1.2 10*3/uL — ABNORMAL LOW (ref 1.4–6.5)
PLATELETS: 214 10*3/uL (ref 150–440)
RBC: 3.99 MIL/uL (ref 3.80–5.20)
RDW: 16.6 % — AB (ref 11.5–14.5)
WBC: 3.9 10*3/uL (ref 3.6–11.0)

## 2015-03-21 LAB — COMPREHENSIVE METABOLIC PANEL
ALBUMIN: 3.6 g/dL (ref 3.5–5.0)
ALK PHOS: 70 U/L (ref 38–126)
ALT: 13 U/L — ABNORMAL LOW (ref 14–54)
ANION GAP: 5 (ref 5–15)
AST: 17 U/L (ref 15–41)
BUN: 15 mg/dL (ref 6–20)
CO2: 26 mmol/L (ref 22–32)
CREATININE: 0.83 mg/dL (ref 0.44–1.00)
Calcium: 8.8 mg/dL — ABNORMAL LOW (ref 8.9–10.3)
Chloride: 106 mmol/L (ref 101–111)
GFR calc Af Amer: >60 mL/min (ref >60)
GFR calc non Af Amer: >60 mL/min (ref >60)
GLUCOSE: 105 mg/dL — AB (ref 65–99)
Potassium: 3.6 mmol/L (ref 3.5–5.1)
SODIUM: 137 mmol/L (ref 135–145)
TOTAL PROTEIN: 6.7 g/dL (ref 6.5–8.1)
Total Bilirubin: 0.4 mg/dL (ref 0.3–1.2)

## 2015-03-21 LAB — MAGNESIUM: MAGNESIUM: 2 mg/dL (ref 1.7–2.4)

## 2015-03-21 MED ORDER — HEPARIN SOD (PORK) LOCK FLUSH 100 UNIT/ML IV SOLN
500.0000 [IU] | Freq: Once | INTRAVENOUS | Status: DC
  Filled 2015-03-21: qty 5

## 2015-03-21 MED ORDER — SODIUM CHLORIDE 0.9 % IJ SOLN
10.0000 mL | Freq: Once | INTRAMUSCULAR | Status: AC
  Administered 2015-03-21: 10 mL via INTRAVENOUS
  Filled 2015-03-21: qty 10

## 2015-03-21 MED ORDER — HEPARIN SOD (PORK) LOCK FLUSH 100 UNIT/ML IV SOLN
500.0000 [IU] | Freq: Once | INTRAVENOUS | Status: AC
  Administered 2015-03-21: 500 [IU] via INTRAVENOUS

## 2015-03-21 MED ORDER — SODIUM CHLORIDE 0.9 % IJ SOLN
10.0000 mL | INTRAMUSCULAR | Status: DC | PRN
  Filled 2015-03-21: qty 10

## 2015-03-21 NOTE — Progress Notes (Signed)
[name] @ [facility] Telephone:(336) [phone]  Fax:(336) [place]: Jun 07, 1941  MR#: [id number]  FTD#:[id number]  Patient Care Team: Jerrol Banana., MD as PCP - General (Family Medicine) [facility] Gaetana Michaelis, MD as Referring Physician (Obstetrics and Gynecology) Robert Bellow, MD as Consulting Physician (General Surgery)  CHIEF COMPLAINT:  Chief Complaint  Patient presents with  . 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 2.   carcinoma  of breast ( left) status post lumpectomy ,adiation  therapyand 5 years of tamoxifen. 3.history of atrial fibrillation being managed by cardiologist 4.  Status post bilateral salpingo-oophorectomy on fourth of May, 2016.  with optimal debulking surgery 5.  Starting chemotherapy with carboplatinum and Taxol in dose dense fashion from Jan 02, 2015 5.  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     Ovarian cancer   12/16/2014 Initial Diagnosis Ovarian cancer    Oncology Flowsheet 12/26/2014 01/12/2015 01/19/2015 02/07/2015 02/14/2015 02/28/2015 03/07/2015  Day, Cycle - Day 1, Cycle 1 Day 8, Cycle 1 Day 1, Cycle 2 2, Cycle 2 Day 1, Cycle 3 Day 8, Cycle 3  CARBOplatin (PARAPLATIN) IV - 540 mg - 450 mg - 450 mg -  dexamethasone (DECADRON) IJ - - - - - - -  dexamethasone (DECADRON) IV - [ 12 mg ] - [ 12 mg ] [ 20 mg ] [ 12 mg ] [ 20 mg ]  enoxaparin (LOVENOX) [place] 40 mg - - - - - -  fosaprepitant (EMEND) IV - [ 150 mg ] - [ 150 mg ] - [ 150 mg ] -  ondansetron (ZOFRAN) IV - - - - [ 8 mg ] - [ 8 mg ]  PACLitaxel (TAXOL) IV - 80 mg/m2 80 mg/m2 80 mg/m2 80 mg/m2 90 mg/m2 90 mg/m2  palonosetron (ALOXI) IV - 0.25 mg - 0.25 mg - 0.25 mg -  scopolamine 1 MG/3DAYS (TRANSDERM-SCOP) TD - - - - - - -    INTERVAL HISTORY: 74 year old lady with a history of ovarian cancer  here to initiate next cycle of chemotherapy.  This is the third cycle of treatment.  Patient cannot get day 15 Taxol treatment because of low blood count.  No chills.  No fever.  No nausea.  No vomiting.  No diarrhea.  Appetite has been stable. Patient is here for ongoing evaluation regarding initiating fourth cycle of chemotherapy.  Had epistaxis which is resolved. Jamie Conner.  No tingling numbness.  No chills fever.  Absolute neutrophil count is 1200 REVIEW OF SYSTEMS:    general status: Patient is feeling weak and tired.  No change in a performance status.  No chills.  No fever. HEENT: Alopecia.  No evidence of stomatitis Lungs: No cough or shortness of breath Cardiac: No chest pain or paroxysmal nocturnal dyspnea GI: No nausea no vomiting no diarrhea no abdominal pain Skin: No rash GU: No dysuria or hematuria Lower extremity no swelling Neurological system: No tingling.  No numbness.  No other focal signs Musculoskeletal system no bony pains  As per HPI. Otherwise, a complete review of systems is negatve.  PAST MEDICAL HISTORY: Past Medical History  Diagnosis Date  . Atrial fibrillation   . Hyperlipidemia   . PVC (premature ventricular contraction)   . Mitral insufficiency   . GERD (gastroesophageal reflux disease)   .  Cancer of breast     Left  . Hypothyroidism   . Ovarian cancer     s/p BSO optimal tumor debulking May 2016    PAST SURGICAL HISTORY: Past Surgical History  Procedure Laterality Date  . Cesarean section      x2  . Abdominal hysterectomy    . Cholecystectomy    . Colonoscopy  2006  . Breast surgery      Left  . Laparotomy with staging N/A 12/22/2014    Procedure: LAPAROTOMY WITH STAGING;  Surgeon: Gillis Ends, MD;  Location: [facility];  Service: Gynecology;  Laterality: N/A;  . Salpingoophorectomy Bilateral 12/22/2014    Procedure: SALPINGO OOPHORECTOMY;  Surgeon: Gillis Ends, MD;  Location: [facility];  Service: Gynecology;   Laterality: Bilateral;  . Debulking N/A 12/22/2014    Procedure: DEBULKING;  Surgeon: Gillis Ends, MD;  Location: [facility];  Service: Gynecology;  Laterality: N/A;  . Laparotomy N/A 12/22/2014    Procedure: EXPLORATORY LAPAROTOMY;  Surgeon: Robert Bellow, MD;  Location: [facility];  Service: General;  Laterality: N/A;  . Portacath placement Right 12/22/2014    Procedure: INSERTION PORT-A-CATH;  Surgeon: Robert Bellow, MD;  Location: [facility];  Service: General;  Laterality: Right;  . Peripheral vascular catheterization Left 12/22/2014    Procedure: PORTA CATH INSERTION;  Surgeon: Robert Bellow, MD;  Location: [facility];  Service: General;  Laterality: Left;  . Bilateral salpingoophorectomy  May 2016    with optimal tumor debulking     FAMILY HISTORY Family History  Problem Relation Age of Onset  .       ADVANCED DIRECTIVES:  Patient does have advance healthcare directive, Patient   does not desire to make any changes  HEALTH MAINTENANCE: History  Substance Use Topics  . Smoking status: Never Smoker   . Smokeless tobacco: Never Used  . Alcohol Use: No      Allergies  Allergen Reactions  . Carafate [Sucralfate] Rash  . Sulfa Antibiotics Rash    Current Outpatient Prescriptions  Medication Sig Dispense Refill  . acetaminophen (TYLENOL) 500 MG chewable tablet Chew 500 mg by mouth every 6 (six) hours as needed for pain.    Marland Kitchen docusate sodium (COLACE) 50 MG capsule Take 50 mg by mouth 2 (two) times daily.    Marland Kitchen levofloxacin (LEVAQUIN) 500 MG tablet Take 1 tablet (500 mg total) by mouth daily. 5 tablet 0  . levothyroxine (SYNTHROID, LEVOTHROID) 50 MCG tablet Take 50 mcg by mouth daily before breakfast.    . lidocaine-prilocaine (EMLA) cream Apply 1 application topically as needed. 30 g 0  . LORazepam (ATIVAN) 0.5 MG tablet Take 1 tablet (0.5 mg total) by mouth at bedtime. 30 tablet 3  . ondansetron (ZOFRAN) 4 MG tablet Take 1 tablet (4 mg total) by mouth every 6 (six)  hours as needed for nausea or vomiting. 30 tablet 3  . pantoprazole (PROTONIX) 40 MG tablet Take 40 mg by mouth daily.    . polyethylene glycol (MIRALAX / GLYCOLAX) packet Take 17 g by mouth daily.    . propafenone (RYTHMOL) 225 MG tablet Take 225 mg by mouth 2 (two) times daily.     No current facility-administered medications for this visit.   Facility-Administered Medications Ordered in Other Visits  Medication Dose Route Frequency Provider Last Rate Last Dose  . heparin lock flush 100 unit/mL  500 Units Intravenous Once Forest Gleason, MD      . heparin lock flush 100 unit/mL  500 Units Intravenous Once Forest Gleason, MD      . sodium chloride 0.9 % injection 10 mL  10 mL Intracatheter PRN Forest Gleason, MD   10 mL at 02/07/15 0930  . sodium chloride 0.9 % injection 10 mL  10 mL Intravenous PRN Forest Gleason, MD        OBJECTIVE:  Filed Vitals:   03/21/15 0850  BP: 138/81  Pulse: 83  Temp: 96.3 F (35.7 C)     Body mass index is 31.17 kg/(m^2).    ECOG FS:1 - Symptomatic but completely ambulatory  PHYSICAL EXAM: GENERAL:  Well developed, well nourished, sitting comfortably in the exam room in no acute distress. MENTAL STATUS:  Alert and oriented to person, place and time. HEAD:   ALOPECIA  Normocephalic, atraumatic, face symmetric, no Cushingoid features. EYES:  Pupils equal round and reactive to light and accomodation.  No conjunctivitis or scleral icterus. ENT:  Oropharynx clear without lesion.  Tongue normal. Mucous membranes moist.  RESPIRATORY:  Clear to auscultation without rales, wheezes or rhonchi. CARDIOVASCULAR:  Regular rate and rhythm without murmur, rub or gallop. BREAST:  Right breast without masses, skin changes or nipple discharge.  Left breast without masses, skin changes or nipple discharge. ABDOMEN:  Soft, non-tender, with active bowel sounds, and no hepatosplenomegaly.  No masses. BACK:  No CVA tenderness.  No tenderness on percussion of the back or rib  cage. SKIN:  No rashes, ulcers or lesions. EXTREMITIES: No edema, no skin discoloration or tenderness.  No palpable cords. LYMPH NODES: No palpable cervical, supraclavicular, axillary or inguinal adenopathy  NEUROLOGICAL: Unremarkable. PSYCH:  Appropriate.   LAB RESULTS:  Infusion on 03/21/2015  Component Date Value Ref Range Status  . WBC 03/21/2015 3.9  3.6 - 11.0 K/uL Final  . RBC 03/21/2015 3.99  3.80 - 5.20 MIL/uL Final  . Hemoglobin 03/21/2015 12.4  12.0 - 16.0 g/dL Final  . HCT 03/21/2015 37.1  35.0 - 47.0 % Final  . MCV 03/21/2015 93.1  80.0 - 100.0 fL Final  . MCH 03/21/2015 31.0  26.0 - 34.0 pg Final  . MCHC 03/21/2015 33.3  32.0 - 36.0 g/dL Final  . RDW 03/21/2015 16.6* 11.5 - 14.5 % Final  . Platelets 03/21/2015 214  150 - 440 K/uL Final  . Neutrophils Relative % 03/21/2015 29   Final  . Neutro Abs 03/21/2015 1.2* 1.4 - 6.5 K/uL Final  . Lymphocytes Relative 03/21/2015 55   Final  . Lymphs Abs 03/21/2015 2.2  1.0 - 3.6 K/uL Final  . Monocytes Relative 03/21/2015 15   Final  . Monocytes Absolute 03/21/2015 0.6  0.2 - 0.9 K/uL Final  . Eosinophils Relative 03/21/2015 0   Final  . Eosinophils Absolute 03/21/2015 0.0  0 - 0.7 K/uL Final  . Basophils Relative 03/21/2015 1   Final  . Basophils Absolute 03/21/2015 0.0  0 - 0.1 K/uL Final  . Sodium 03/21/2015 137  135 - 145 mmol/L Final  . Potassium 03/21/2015 3.6  3.5 - 5.1 mmol/L Final  . Chloride 03/21/2015 106  101 - 111 mmol/L Final  . CO2 03/21/2015 26  22 - 32 mmol/L Final  . Glucose, Bld 03/21/2015 105* 65 - 99 mg/dL Final  . BUN 03/21/2015 15  6 - 20 mg/dL Final  . Creatinine, Ser 03/21/2015 0.83  0.44 - 1.00 mg/dL Final  . Calcium 03/21/2015 8.8* 8.9 - 10.3 mg/dL Final  . Total Protein 03/21/2015 6.7  6.5 - 8.1 g/dL Final  . Albumin  03/21/2015 3.6  3.5 - 5.0 g/dL Final  . AST 03/21/2015 17  15 - 41 U/L Final  . ALT 03/21/2015 13* 14 - 54 U/L Final  . Alkaline Phosphatase 03/21/2015 70  38 - 126 U/L Final  .  Total Bilirubin 03/21/2015 0.4  0.3 - 1.2 mg/dL Final  . GFR calc non Af Amer 03/21/2015 >60  >60 mL/min Final  . GFR calc Af Amer 03/21/2015 >60  >60 mL/min Final   Comment: (NOTE) The eGFR has been calculated using the CKD EPI equation. This calculation has not been validated in all clinical situations. eGFR's persistently <60 mL/min signify possible Chronic Kidney Disease.   . Anion gap 03/21/2015 5  5 - 15 Final  . Magnesium 03/21/2015 2.0  1.7 - 2.4 mg/dL Final      ASSESSMENT: OVARIAN  cancer stage III Continue chemotherapy with carboplatinum and Taxol.  Nose of Taxol was increased to 90 mg/m and scheduled be changed to day 1 and day 8 Taxol chemotherapy program In view of absolute neutrophil count not bouncing back and also missing on day 8 of Taxol treatment will proceed with full dose of chemotherapy cycle with carboplatinum Taxol followed by Neulasta starting next week. 3 more cycles of chemotherapy would be given with carboplatinum with AUC of 5 and Taxol of 175 mg/m followed by Neulasta  MEDICAL DECISION MAKING:  All lab data has been reviewed.  Patient  is myelosuppressed secondary to chemotherapy. Was advised to call me if spikes fever more than 100.  Or any chills or fever.  Hold chemotherapy  Patient expressed understanding and was in agreement with this plan. She also understands that She can call clinic at any time with any questions, concerns, or complaints.   Patient  is myelosuppressed secondary to chemotherapy. Was advised to call me if spikes fever more than 100.  Or any chills or fever.  Hold chemotherapy Ovarian cancer   Staging form: Ovary, AJCC 7th Edition     Clinical: Stage IIIC (T3c, N0, M0) - Unsigned   Forest Gleason, MD   03/21/2015 9:10 AM

## 2015-03-21 NOTE — Progress Notes (Signed)
Patient does have living will.  Never smoked. Patient states she has had some nose bleeds and the inside of her nose is sore.  Wants to know if she can use gentamycin ointment?

## 2015-03-28 ENCOUNTER — Inpatient Hospital Stay: Payer: PPO

## 2015-03-28 ENCOUNTER — Encounter: Payer: Self-pay | Admitting: Oncology

## 2015-03-28 ENCOUNTER — Inpatient Hospital Stay (HOSPITAL_BASED_OUTPATIENT_CLINIC_OR_DEPARTMENT_OTHER): Payer: PPO | Admitting: Oncology

## 2015-03-28 ENCOUNTER — Other Ambulatory Visit: Payer: Self-pay | Admitting: Oncology

## 2015-03-28 VITALS — BP 115/73 | HR 74 | Temp 95.8°F | Wt 174.2 lb

## 2015-03-28 DIAGNOSIS — L659 Nonscarring hair loss, unspecified: Secondary | ICD-10-CM

## 2015-03-28 DIAGNOSIS — Z90722 Acquired absence of ovaries, bilateral: Secondary | ICD-10-CM

## 2015-03-28 DIAGNOSIS — R531 Weakness: Secondary | ICD-10-CM

## 2015-03-28 DIAGNOSIS — C561 Malignant neoplasm of right ovary: Secondary | ICD-10-CM | POA: Diagnosis not present

## 2015-03-28 DIAGNOSIS — Z853 Personal history of malignant neoplasm of breast: Secondary | ICD-10-CM

## 2015-03-28 DIAGNOSIS — T451X5S Adverse effect of antineoplastic and immunosuppressive drugs, sequela: Secondary | ICD-10-CM | POA: Diagnosis not present

## 2015-03-28 DIAGNOSIS — Z5111 Encounter for antineoplastic chemotherapy: Secondary | ICD-10-CM | POA: Diagnosis not present

## 2015-03-28 DIAGNOSIS — C569 Malignant neoplasm of unspecified ovary: Secondary | ICD-10-CM

## 2015-03-28 DIAGNOSIS — Z9223 Personal history of estrogen therapy: Secondary | ICD-10-CM

## 2015-03-28 DIAGNOSIS — D701 Agranulocytosis secondary to cancer chemotherapy: Secondary | ICD-10-CM | POA: Diagnosis not present

## 2015-03-28 DIAGNOSIS — R5383 Other fatigue: Secondary | ICD-10-CM

## 2015-03-28 DIAGNOSIS — I4891 Unspecified atrial fibrillation: Secondary | ICD-10-CM

## 2015-03-28 DIAGNOSIS — R040 Epistaxis: Secondary | ICD-10-CM

## 2015-03-28 DIAGNOSIS — Z923 Personal history of irradiation: Secondary | ICD-10-CM

## 2015-03-28 DIAGNOSIS — C562 Malignant neoplasm of left ovary: Secondary | ICD-10-CM

## 2015-03-28 LAB — CBC WITH DIFFERENTIAL/PLATELET
Basophils Absolute: 0 10*3/uL (ref 0–0.1)
Basophils Relative: 1 %
EOS ABS: 0 10*3/uL (ref 0–0.7)
Eosinophils Relative: 1 %
HEMATOCRIT: 37.9 % (ref 35.0–47.0)
Hemoglobin: 12.7 g/dL (ref 12.0–16.0)
Lymphocytes Relative: 45 %
Lymphs Abs: 2.6 10*3/uL (ref 1.0–3.6)
MCH: 31.1 pg (ref 26.0–34.0)
MCHC: 33.5 g/dL (ref 32.0–36.0)
MCV: 93 fL (ref 80.0–100.0)
MONO ABS: 0.8 10*3/uL (ref 0.2–0.9)
Monocytes Relative: 13 %
Neutro Abs: 2.3 10*3/uL (ref 1.4–6.5)
Neutrophils Relative %: 40 %
Platelets: 235 10*3/uL (ref 150–440)
RBC: 4.07 MIL/uL (ref 3.80–5.20)
RDW: 17.3 % — ABNORMAL HIGH (ref 11.5–14.5)
WBC: 5.7 10*3/uL (ref 3.6–11.0)

## 2015-03-28 MED ORDER — SODIUM CHLORIDE 0.9 % IV SOLN
175.0000 mg/m2 | Freq: Once | INTRAVENOUS | Status: AC
  Administered 2015-03-28: 330 mg via INTRAVENOUS
  Filled 2015-03-28: qty 55

## 2015-03-28 MED ORDER — SODIUM CHLORIDE 0.9 % IV SOLN: 447.0000 mg | Freq: Once | INTRAVENOUS | Status: DC

## 2015-03-28 MED ORDER — SODIUM CHLORIDE 0.9 % IV SOLN
Freq: Once | INTRAVENOUS | Status: AC
  Filled 2015-03-28: qty 1000

## 2015-03-28 MED ORDER — SODIUM CHLORIDE 0.9 % IV SOLN
Freq: Once | INTRAVENOUS | Status: DC
  Filled 2015-03-28: qty 5

## 2015-03-28 MED ORDER — FAMOTIDINE IN NACL 20-0.9 MG/50ML-% IV SOLN: 20.0000 mg | Freq: Once | INTRAVENOUS | Status: DC

## 2015-03-28 MED ORDER — HEPARIN SOD (PORK) LOCK FLUSH 100 UNIT/ML IV SOLN
500.0000 [IU] | Freq: Once | INTRAVENOUS | Status: AC
  Administered 2015-03-28: 500 [IU] via INTRAVENOUS
  Filled 2015-03-28: qty 5

## 2015-03-28 MED ORDER — PALONOSETRON HCL INJECTION 0.25 MG/5ML
0.2500 mg | Freq: Once | INTRAVENOUS | Status: AC
Start: 2015-03-28 — End: 2015-03-28
  Administered 2015-03-28: 0.25 mg via INTRAVENOUS
  Filled 2015-03-28: qty 5

## 2015-03-28 MED ORDER — SODIUM CHLORIDE 0.9 % IV SOLN
447.0000 mg | Freq: Once | INTRAVENOUS | Status: AC
  Administered 2015-03-28: 450 mg via INTRAVENOUS
  Filled 2015-03-28: qty 45

## 2015-03-28 MED ORDER — DIPHENHYDRAMINE HCL 50 MG/ML IJ SOLN: 50.0000 mg | Freq: Once | INTRAMUSCULAR | Status: DC

## 2015-03-28 MED ORDER — PALONOSETRON HCL INJECTION 0.25 MG/5ML: 0.2500 mg | Freq: Once | INTRAVENOUS | Status: DC

## 2015-03-28 MED ORDER — PEGFILGRASTIM 6 MG/0.6ML ~~LOC~~ PSKT
6.0000 mg | PREFILLED_SYRINGE | Freq: Once | SUBCUTANEOUS | Status: AC
  Administered 2015-03-28: 6 mg via SUBCUTANEOUS
  Filled 2015-03-28: qty 0.6

## 2015-03-28 MED ORDER — DIPHENHYDRAMINE HCL 50 MG/ML IJ SOLN
50.0000 mg | Freq: Once | INTRAMUSCULAR | Status: AC
  Administered 2015-03-28: 50 mg via INTRAVENOUS
  Filled 2015-03-28: qty 1

## 2015-03-28 MED ORDER — FAMOTIDINE IN NACL 20-0.9 MG/50ML-% IV SOLN
20.0000 mg | Freq: Once | INTRAVENOUS | Status: AC
  Administered 2015-03-28: 20 mg via INTRAVENOUS
  Filled 2015-03-28: qty 50

## 2015-03-28 MED ORDER — PEGFILGRASTIM 6 MG/0.6ML ~~LOC~~ PSKT: 6.0000 mg | PREFILLED_SYRINGE | Freq: Once | SUBCUTANEOUS | Status: DC

## 2015-03-28 MED ORDER — PACLITAXEL CHEMO INJECTION 300 MG/50ML: 175.0000 mg/m2 | Freq: Once | INTRAVENOUS | Status: DC

## 2015-03-28 MED ORDER — SODIUM CHLORIDE 0.9 % IJ SOLN
10.0000 mL | INTRAMUSCULAR | Status: DC | PRN
  Administered 2015-03-28: 10 mL via INTRAVENOUS
  Filled 2015-03-28: qty 10

## 2015-03-28 MED ORDER — FOSAPREPITANT DIMEGLUMINE INJECTION 150 MG
Freq: Once | INTRAVENOUS | Status: AC
  Administered 2015-03-28: 11:00:00 via INTRAVENOUS
  Filled 2015-03-28: qty 5

## 2015-03-28 MED ORDER — SODIUM CHLORIDE 0.9 % IV SOLN
Freq: Once | INTRAVENOUS | Status: AC
  Administered 2015-03-28: 10:00:00 via INTRAVENOUS
  Filled 2015-03-28: qty 1000

## 2015-03-28 NOTE — Progress Notes (Signed)
[place] @ [facility] [phone]  Fax:([phone]) [place]: 05-Aug-1941  MR#: [id number]  KTG#:[id number]  Patient Care Team: Jerrol Banana., MD as PCP - General (Family Medicine) [facility] Gaetana Michaelis, MD as Referring Physician (Obstetrics and Gynecology) Robert Bellow, MD as Consulting Physician (General Surgery)  CHIEF COMPLAINT:  Chief Complaint  Patient presents with  . 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 2.   carcinoma  of breast ( left) status post lumpectomy ,adiation  therapyand 5 years of tamoxifen. 3.history of atrial fibrillation being managed by cardiologist 4.  Status post bilateral salpingo-oophorectomy on fourth of May, 2016.  with optimal debulking surgery 5.  Starting chemotherapy with carboplatinum and Taxol in dose dense fashion from Jan 02, 2015 5.  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     Ovarian cancer   12/16/2014 Initial Diagnosis Ovarian cancer    Oncology Flowsheet 12/26/2014 01/12/2015 01/19/2015 02/07/2015 02/14/2015 02/28/2015 03/07/2015  Day, Cycle - Day 1, Cycle 1 Day 8, Cycle 1 Day 1, Cycle 2 2, Cycle 2 Day 1, Cycle 3 Day 8, Cycle 3  CARBOplatin (PARAPLATIN) IV - 540 mg - 450 mg - 450 mg -  dexamethasone (DECADRON) IJ - - - - - - -  dexamethasone (DECADRON) IV - [ 12 mg ] - [ 12 mg ] [ 20 mg ] [ 12 mg ] [ 20 mg ]  enoxaparin (LOVENOX) [place] 40 mg - - - - - -  fosaprepitant (EMEND) IV - [ 150 mg ] - [ 150 mg ] - [ 150 mg ] -  ondansetron (ZOFRAN) IV - - - - [ 8 mg ] - [ 8 mg ]  PACLitaxel (TAXOL) IV - 80 mg/m2 80 mg/m2 80 mg/m2 80 mg/m2 90 mg/m2 90 mg/m2  palonosetron (ALOXI) IV - 0.25 mg - 0.25 mg - 0.25 mg -  scopolamine 1 MG/3DAYS (TRANSDERM-SCOP) TD - - - - - - -    INTERVAL HISTORY: 74 year old lady with a history of ovarian cancer  here to initiate next cycle of chemotherapy.  This is the third cycle of treatment.  Patient is here for ongoing evaluation.  Patient is missing consistently day 15 Taxol so treatment is going to be switched over to standard carboplatinum and Taxol therapy with Taxol 175 mg/m on day 1 and chemotherapy repeated every [redacted] weeks along with Neulasta.  Patient is quite concerned about Neulasta because he is watched all the advertisements about side effect. REVIEW OF SYSTEMS:    general status: Patient is feeling weak and tired.  No change in a performance status.  No chills.  No fever. HEENT: Alopecia.  No evidence of stomatitis Lungs: No cough or shortness of breath Cardiac: No chest pain or paroxysmal nocturnal dyspnea GI: No nausea no vomiting no diarrhea no abdominal pain Skin: No rash GU: No dysuria or hematuria Lower extremity no swelling Neurological system: No tingling.  No numbness.  No other focal signs Musculoskeletal system no bony pains  As per HPI. Otherwise, a complete review of systems is negatve.  PAST MEDICAL HISTORY: Past Medical History  Diagnosis Date  . Atrial fibrillation   . Hyperlipidemia   . PVC (premature ventricular contraction)   . Mitral insufficiency   . GERD (gastroesophageal reflux disease)   . Cancer of breast  Left  . Hypothyroidism   . Ovarian cancer     s/p BSO optimal tumor debulking May 2016    PAST SURGICAL HISTORY: Past Surgical History  Procedure Laterality Date  . Cesarean section      x2  . Abdominal hysterectomy    . Cholecystectomy    . Colonoscopy  2006  . Breast surgery      Left  . Laparotomy with staging N/A 12/22/2014    Procedure: LAPAROTOMY WITH STAGING;  Surgeon: Gillis Ends, MD;  Location: [facility];  Service: Gynecology;  Laterality: N/A;  . Salpingoophorectomy Bilateral 12/22/2014    Procedure: SALPINGO OOPHORECTOMY;  Surgeon: Gillis Ends, MD;  Location: [facility];  Service: Gynecology;  Laterality:  Bilateral;  . Debulking N/A 12/22/2014    Procedure: DEBULKING;  Surgeon: Gillis Ends, MD;  Location: [facility] OR[facility];  Service: Gynecology;  Laterality: N/A;  . Laparotomy N/A 12/22/2014    Procedure: EXPLORATORY LAPAROTOMY;  Surgeon: Robert Bellow, MD;  Location: [facility];  Service: General;  Laterality: N/A;  . Portacath placement Right 12/22/2014    Procedure: INSERTION PORT-A-CATH;  Surgeon: Robert Bellow, MD;  Location: [facility];  Service: General;  Laterality: Right;  . Peripheral vascular catheterization Left 12/22/2014    Procedure: PORTA CATH INSERTION;  Surgeon: Robert Bellow, MD;  Location: [facility];  Service: General;  Laterality: Left;  . Bilateral salpingoophorectomy  May 2016    with optimal tumor debulking     FAMILY HISTORY Family History  Problem Relation Age of Onset  .       ADVANCED DIRECTIVES:  Patient does have advance healthcare directive, Patient   does not desire to make any changes  HEALTH MAINTENANCE: History  Substance Use Topics  . Smoking status: Never Smoker   . Smokeless tobacco: Never Used  . Alcohol Use: No      Allergies  Allergen Reactions  . Carafate [Sucralfate] Rash  . Sulfa Antibiotics Rash    Current Outpatient Prescriptions  Medication Sig Dispense Refill  . acetaminophen (TYLENOL) 500 MG chewable tablet Chew 500 mg by mouth every 6 (six) hours as needed for pain.    Marland Kitchen docusate sodium (COLACE) 50 MG capsule Take 50 mg by mouth 2 (two) times daily.    Marland Kitchen levofloxacin (LEVAQUIN) 500 MG tablet Take 1 tablet (500 mg total) by mouth daily. 5 tablet 0  . levothyroxine (SYNTHROID, LEVOTHROID) 50 MCG tablet Take 50 mcg by mouth daily before breakfast.    . lidocaine-prilocaine (EMLA) cream Apply 1 application topically as needed. 30 g 0  . LORazepam (ATIVAN) 0.5 MG tablet Take 1 tablet (0.5 mg total) by mouth at bedtime. 30 tablet 3  . ondansetron (ZOFRAN) 4 MG tablet Take 1 tablet (4 mg total) by mouth every 6 (six) hours as  needed for nausea or vomiting. 30 tablet 3  . pantoprazole (PROTONIX) 40 MG tablet Take 40 mg by mouth daily.    . polyethylene glycol (MIRALAX / GLYCOLAX) packet Take 17 g by mouth daily.    . propafenone (RYTHMOL) 225 MG tablet Take 225 mg by mouth 2 (two) times daily.     No current facility-administered medications for this visit.   Facility-Administered Medications Ordered in Other Visits  Medication Dose Route Frequency Provider Last Rate Last Dose  . heparin lock flush 100 unit/mL  500 Units Intravenous Once Forest Gleason, MD      . sodium chloride 0.9 % injection 10 mL  10 mL Intracatheter PRN  Forest Gleason, MD   10 mL at 02/07/15 0930  . sodium chloride 0.9 % injection 10 mL  10 mL Intravenous PRN Forest Gleason, MD        OBJECTIVE:  Filed Vitals:   03/28/15 0906  BP: 115/73  Pulse: 74  Temp: 95.8 F (35.4 C)     Body mass index is 30.86 kg/(m^2).    ECOG FS:1 - Symptomatic but completely ambulatory  PHYSICAL EXAM: GENERAL:  Well developed, well nourished, sitting comfortably in the exam room in no acute distress. MENTAL STATUS:  Alert and oriented to person, place and time. HEAD:   ALOPECIA  Normocephalic, atraumatic, face symmetric, no Cushingoid features. EYES:  Pupils equal round and reactive to light and accomodation.  No conjunctivitis or scleral icterus. ENT:  Oropharynx clear without lesion.  Tongue normal. Mucous membranes moist.  RESPIRATORY:  Clear to auscultation without rales, wheezes or rhonchi. CARDIOVASCULAR:  Regular rate and rhythm without murmur, rub or gallop. BREAST:  Right breast without masses, skin changes or nipple discharge.  Left breast without masses, skin changes or nipple discharge. ABDOMEN:  Soft, non-tender, with active bowel sounds, and no hepatosplenomegaly.  No masses. BACK:  No CVA tenderness.  No tenderness on percussion of the back or rib cage. SKIN:  No rashes, ulcers or lesions. EXTREMITIES: No edema, no skin discoloration or  tenderness.  No palpable cords. LYMPH NODES: No palpable cervical, supraclavicular, axillary or inguinal adenopathy  NEUROLOGICAL: Unremarkable. PSYCH:  Appropriate.   LAB RESULTS:  Infusion on 03/28/2015  Component Date Value Ref Range Status  . WBC 03/28/2015 5.7  3.6 - 11.0 K/uL Final   A-LINE DRAW  . RBC 03/28/2015 4.07  3.80 - 5.20 MIL/uL Final  . Hemoglobin 03/28/2015 12.7  12.0 - 16.0 g/dL Final  . HCT 03/28/2015 37.9  35.0 - 47.0 % Final  . MCV 03/28/2015 93.0  80.0 - 100.0 fL Final  . MCH 03/28/2015 31.1  26.0 - 34.0 pg Final  . MCHC 03/28/2015 33.5  32.0 - 36.0 g/dL Final  . RDW 03/28/2015 17.3* 11.5 - 14.5 % Final  . Platelets 03/28/2015 235  150 - 440 K/uL Final  . Neutrophils Relative % 03/28/2015 40   Final  . Neutro Abs 03/28/2015 2.3  1.4 - 6.5 K/uL Final  . Lymphocytes Relative 03/28/2015 45   Final  . Lymphs Abs 03/28/2015 2.6  1.0 - 3.6 K/uL Final  . Monocytes Relative 03/28/2015 13   Final  . Monocytes Absolute 03/28/2015 0.8  0.2 - 0.9 K/uL Final  . Eosinophils Relative 03/28/2015 1   Final  . Eosinophils Absolute 03/28/2015 0.0  0 - 0.7 K/uL Final  . Basophils Relative 03/28/2015 1   Final  . Basophils Absolute 03/28/2015 0.0  0 - 0.1 K/uL Final      ASSESSMENT: OVARIAN  cancer stage III Continue chemotherapy with carboplatinum and Taxol.  Nose of Taxol was increased to 90 mg/m and scheduled be changed to day 1 and day 8 Taxol chemotherapy program In view of absolute neutrophil count not bouncing back and also missing on day 8 of Taxol treatment will proceed with full dose of chemotherapy cycle with carboplatinum Taxol followed by Neulasta starting next week. 3 more cycles of chemotherapy would be given with carboplatinum with AUC of 5 and Taxol of 175 mg/m followed by Neulasta  MEDICAL DECISION MAKING:  All lab data has been reviewed.  Patient has recovered from myelosuppression Did not have any fever History chemotherapy to standard dose  of  carboplatinum and Taxol 175 mg/m followed by Neulasta every week 3 Total duration of visit was 45 minutes.  50% or more time was spent in counseling patient and family regarding prognosis and options of treatment and available resources patient's concern regarding Neulasta has been addressed Patient expressed understanding and was in agreement with this plan. She also understands that She can call clinic at any time with any questions, concerns, or complaints.     Staging form: Ovary, AJCC 7th Edition     Clinical: Stage IIIC (T3c, N0, M0) - Unsigned   Forest Gleason, MD   03/28/2015 9:44 AM

## 2015-03-28 NOTE — Progress Notes (Signed)
Patient does have living will. Never smoked. 

## 2015-04-04 ENCOUNTER — Other Ambulatory Visit: Payer: Self-pay | Admitting: *Deleted

## 2015-04-04 ENCOUNTER — Inpatient Hospital Stay: Payer: PPO

## 2015-04-04 DIAGNOSIS — Z5111 Encounter for antineoplastic chemotherapy: Secondary | ICD-10-CM | POA: Diagnosis not present

## 2015-04-04 DIAGNOSIS — C569 Malignant neoplasm of unspecified ovary: Secondary | ICD-10-CM

## 2015-04-04 LAB — COMPREHENSIVE METABOLIC PANEL
ALT: 18 U/L (ref 14–54)
ANION GAP: 7 (ref 5–15)
AST: 19 U/L (ref 15–41)
Albumin: 3.6 g/dL (ref 3.5–5.0)
Alkaline Phosphatase: 88 U/L (ref 38–126)
BUN: 15 mg/dL (ref 6–20)
CHLORIDE: 105 mmol/L (ref 101–111)
CO2: 24 mmol/L (ref 22–32)
Calcium: 9.2 mg/dL (ref 8.9–10.3)
Creatinine, Ser: 0.84 mg/dL (ref 0.44–1.00)
Glucose, Bld: 92 mg/dL (ref 65–99)
POTASSIUM: 4.1 mmol/L (ref 3.5–5.1)
SODIUM: 136 mmol/L (ref 135–145)
Total Bilirubin: 0.4 mg/dL (ref 0.3–1.2)
Total Protein: 6.5 g/dL (ref 6.5–8.1)

## 2015-04-04 LAB — CBC WITH DIFFERENTIAL/PLATELET
BASOS PCT: 0 %
Band Neutrophils: 7 %
Basophils Absolute: 0 10*3/uL (ref 0–0.1)
EOS ABS: 0.2 10*3/uL (ref 0–0.7)
EOS PCT: 2 %
HCT: 37.9 % (ref 35.0–47.0)
Hemoglobin: 12.8 g/dL (ref 12.0–16.0)
LYMPHS PCT: 47 %
Lymphs Abs: 3.6 10*3/uL (ref 1.0–3.6)
MCH: 31.4 pg (ref 26.0–34.0)
MCHC: 33.8 g/dL (ref 32.0–36.0)
MCV: 93 fL (ref 80.0–100.0)
MONOS PCT: 16 %
Metamyelocytes Relative: 3 %
Monocytes Absolute: 1.3 10*3/uL — ABNORMAL HIGH (ref 0.2–0.9)
Myelocytes: 1 %
NEUTROS PCT: 24 %
Neutro Abs: 2.8 10*3/uL (ref 1.4–6.5)
PLATELETS: 153 10*3/uL (ref 150–440)
RBC: 4.08 MIL/uL (ref 3.80–5.20)
RDW: 17.9 % — AB (ref 11.5–14.5)
WBC: 7.9 10*3/uL (ref 3.6–11.0)

## 2015-04-04 LAB — MAGNESIUM: Magnesium: 1.8 mg/dL (ref 1.7–2.4)

## 2015-04-10 ENCOUNTER — Inpatient Hospital Stay
Admission: EM | Admit: 2015-04-10 | Discharge: 2015-04-11 | DRG: 756 | Disposition: A | Discharge status: Home / Self Care | Payer: PPO | Attending: Internal Medicine | Admitting: Internal Medicine

## 2015-04-10 ENCOUNTER — Emergency Department: Payer: PPO

## 2015-04-10 DIAGNOSIS — C569 Malignant neoplasm of unspecified ovary: Principal | ICD-10-CM | POA: Diagnosis present

## 2015-04-10 DIAGNOSIS — M542 Cervicalgia: Secondary | ICD-10-CM | POA: Diagnosis present

## 2015-04-10 DIAGNOSIS — A419 Sepsis, unspecified organism: Secondary | ICD-10-CM | POA: Diagnosis not present

## 2015-04-10 DIAGNOSIS — E785 Hyperlipidemia, unspecified: Secondary | ICD-10-CM | POA: Diagnosis present

## 2015-04-10 DIAGNOSIS — I480 Paroxysmal atrial fibrillation: Secondary | ICD-10-CM | POA: Diagnosis present

## 2015-04-10 DIAGNOSIS — Z79899 Other long term (current) drug therapy: Secondary | ICD-10-CM

## 2015-04-10 DIAGNOSIS — R509 Fever, unspecified: Secondary | ICD-10-CM | POA: Diagnosis present

## 2015-04-10 DIAGNOSIS — Z9221 Personal history of antineoplastic chemotherapy: Secondary | ICD-10-CM

## 2015-04-10 DIAGNOSIS — I340 Nonrheumatic mitral (valve) insufficiency: Secondary | ICD-10-CM | POA: Diagnosis present

## 2015-04-10 DIAGNOSIS — Z882 Allergy status to sulfonamides status: Secondary | ICD-10-CM

## 2015-04-10 DIAGNOSIS — J029 Acute pharyngitis, unspecified: Secondary | ICD-10-CM | POA: Diagnosis present

## 2015-04-10 DIAGNOSIS — Z888 Allergy status to other drugs, medicaments and biological substances status: Secondary | ICD-10-CM

## 2015-04-10 DIAGNOSIS — Z90710 Acquired absence of both cervix and uterus: Secondary | ICD-10-CM

## 2015-04-10 DIAGNOSIS — Z853 Personal history of malignant neoplasm of breast: Secondary | ICD-10-CM

## 2015-04-10 DIAGNOSIS — Z9049 Acquired absence of other specified parts of digestive tract: Secondary | ICD-10-CM | POA: Diagnosis present

## 2015-04-10 DIAGNOSIS — Z809 Family history of malignant neoplasm, unspecified: Secondary | ICD-10-CM

## 2015-04-10 DIAGNOSIS — I493 Ventricular premature depolarization: Secondary | ICD-10-CM | POA: Diagnosis present

## 2015-04-10 DIAGNOSIS — E039 Hypothyroidism, unspecified: Secondary | ICD-10-CM | POA: Diagnosis present

## 2015-04-10 DIAGNOSIS — K219 Gastro-esophageal reflux disease without esophagitis: Secondary | ICD-10-CM | POA: Diagnosis present

## 2015-04-10 DIAGNOSIS — Z8249 Family history of ischemic heart disease and other diseases of the circulatory system: Secondary | ICD-10-CM

## 2015-04-10 DIAGNOSIS — Z9889 Other specified postprocedural states: Secondary | ICD-10-CM

## 2015-04-10 DIAGNOSIS — R6510 Systemic inflammatory response syndrome (SIRS) of non-infectious origin without acute organ dysfunction: Secondary | ICD-10-CM

## 2015-04-10 LAB — COMPREHENSIVE METABOLIC PANEL
ALT: 14 U/L (ref 14–54)
AST: 20 U/L (ref 15–41)
Albumin: 3.2 g/dL — ABNORMAL LOW (ref 3.5–5.0)
Alkaline Phosphatase: 102 U/L (ref 38–126)
Anion gap: 7 (ref 5–15)
BUN: 12 mg/dL (ref 6–20)
CO2: 24 mmol/L (ref 22–32)
CREATININE: 0.84 mg/dL (ref 0.44–1.00)
Calcium: 9 mg/dL (ref 8.9–10.3)
Chloride: 108 mmol/L (ref 101–111)
GFR calc Af Amer: >60 mL/min (ref >60)
Glucose, Bld: 108 mg/dL — ABNORMAL HIGH (ref 65–99)
POTASSIUM: 4 mmol/L (ref 3.5–5.1)
Sodium: 139 mmol/L (ref 135–145)
TOTAL PROTEIN: 6.1 g/dL — AB (ref 6.5–8.1)

## 2015-04-10 LAB — URINALYSIS COMPLETE WITH MICROSCOPIC (ARMC ONLY)
BILIRUBIN URINE: NEGATIVE
Glucose, UA: NEGATIVE mg/dL
KETONES UR: NEGATIVE mg/dL
LEUKOCYTES UA: NEGATIVE
Nitrite: NEGATIVE
Protein, ur: NEGATIVE mg/dL
SQUAMOUS EPITHELIAL / LPF: NONE SEEN
Specific Gravity, Urine: 1.008 (ref 1.005–1.030)
pH: 7 (ref 5.0–8.0)

## 2015-04-10 LAB — CBC WITH DIFFERENTIAL/PLATELET
BASOS ABS: 0.2 10*3/uL — AB (ref 0–0.1)
Basophils Relative: 1 %
Eosinophils Absolute: 0 10*3/uL (ref 0–0.7)
Eosinophils Relative: 0 %
HEMATOCRIT: 35.8 % (ref 35.0–47.0)
Hemoglobin: 11.9 g/dL — ABNORMAL LOW (ref 12.0–16.0)
LYMPHS ABS: 3.1 10*3/uL (ref 1.0–3.6)
LYMPHS PCT: 22 %
MCH: 31.2 pg (ref 26.0–34.0)
MCHC: 33.1 g/dL (ref 32.0–36.0)
MCV: 94.2 fL (ref 80.0–100.0)
MONO ABS: 1.3 10*3/uL — AB (ref 0.2–0.9)
MONOS PCT: 9 %
NEUTROS ABS: 9.9 10*3/uL — AB (ref 1.4–6.5)
Neutrophils Relative %: 68 %
Platelets: 187 10*3/uL (ref 150–440)
RBC: 3.8 MIL/uL (ref 3.80–5.20)
RDW: 18.3 % — AB (ref 11.5–14.5)
WBC: 14.6 10*3/uL — ABNORMAL HIGH (ref 3.6–11.0)

## 2015-04-10 LAB — LIPASE, BLOOD: LIPASE: 27 U/L (ref 22–51)

## 2015-04-10 LAB — LACTIC ACID, PLASMA: Lactic Acid, Venous: 1.3 mmol/L (ref 0.5–2.0)

## 2015-04-10 MED ORDER — CEFTRIAXONE SODIUM 2 G IJ SOLR
2.0000 g | Freq: Once | INTRAMUSCULAR | Status: AC
  Administered 2015-04-11: 2 g via INTRAVENOUS
  Filled 2015-04-10: qty 2

## 2015-04-10 NOTE — ED Notes (Signed)
Attempted x1 for [facility] access. Unsuccessful.  IV placed instead per patient request.

## 2015-04-10 NOTE — ED Notes (Signed)
Pt reports started yesterday with pain at the base of her neck that feels like she has swollen glands. Denies sore throat. Pt is currently being treated for ovarian cancer with chemo. Last chemo tx was on Aug 15th. Pt developed a fever this afternoon and was 100.1 at home.

## 2015-04-10 NOTE — ED Notes (Addendum)
Pt returned to room from xray; husband at bedside; pt with no distress; reports pain to base of throat, especially with palpation which has been present since yesterday; pt & husb voice understanding of plan of care; pt report previous nurse attempted to access her portacath without success; instructed pt on plan to access now and pt voices understanding and agreeance

## 2015-04-11 ENCOUNTER — Inpatient Hospital Stay: Payer: PPO

## 2015-04-11 ENCOUNTER — Encounter: Payer: Self-pay | Admitting: Internal Medicine

## 2015-04-11 ENCOUNTER — Other Ambulatory Visit: Payer: Self-pay

## 2015-04-11 DIAGNOSIS — K219 Gastro-esophageal reflux disease without esophagitis: Secondary | ICD-10-CM | POA: Diagnosis present

## 2015-04-11 DIAGNOSIS — Z9221 Personal history of antineoplastic chemotherapy: Secondary | ICD-10-CM | POA: Diagnosis not present

## 2015-04-11 DIAGNOSIS — Z90710 Acquired absence of both cervix and uterus: Secondary | ICD-10-CM | POA: Diagnosis not present

## 2015-04-11 DIAGNOSIS — Z809 Family history of malignant neoplasm, unspecified: Secondary | ICD-10-CM | POA: Diagnosis not present

## 2015-04-11 DIAGNOSIS — Z79899 Other long term (current) drug therapy: Secondary | ICD-10-CM | POA: Diagnosis not present

## 2015-04-11 DIAGNOSIS — Z8249 Family history of ischemic heart disease and other diseases of the circulatory system: Secondary | ICD-10-CM | POA: Diagnosis not present

## 2015-04-11 DIAGNOSIS — I493 Ventricular premature depolarization: Secondary | ICD-10-CM | POA: Diagnosis present

## 2015-04-11 DIAGNOSIS — Z853 Personal history of malignant neoplasm of breast: Secondary | ICD-10-CM | POA: Diagnosis not present

## 2015-04-11 DIAGNOSIS — M542 Cervicalgia: Secondary | ICD-10-CM | POA: Diagnosis present

## 2015-04-11 DIAGNOSIS — I480 Paroxysmal atrial fibrillation: Secondary | ICD-10-CM | POA: Diagnosis present

## 2015-04-11 DIAGNOSIS — J029 Acute pharyngitis, unspecified: Secondary | ICD-10-CM | POA: Diagnosis present

## 2015-04-11 DIAGNOSIS — R509 Fever, unspecified: Secondary | ICD-10-CM | POA: Diagnosis present

## 2015-04-11 DIAGNOSIS — Z882 Allergy status to sulfonamides status: Secondary | ICD-10-CM | POA: Diagnosis not present

## 2015-04-11 DIAGNOSIS — C569 Malignant neoplasm of unspecified ovary: Secondary | ICD-10-CM | POA: Diagnosis present

## 2015-04-11 DIAGNOSIS — A419 Sepsis, unspecified organism: Secondary | ICD-10-CM | POA: Diagnosis present

## 2015-04-11 DIAGNOSIS — E785 Hyperlipidemia, unspecified: Secondary | ICD-10-CM | POA: Diagnosis present

## 2015-04-11 DIAGNOSIS — I340 Nonrheumatic mitral (valve) insufficiency: Secondary | ICD-10-CM | POA: Diagnosis present

## 2015-04-11 DIAGNOSIS — Z9889 Other specified postprocedural states: Secondary | ICD-10-CM | POA: Diagnosis not present

## 2015-04-11 DIAGNOSIS — Z888 Allergy status to other drugs, medicaments and biological substances status: Secondary | ICD-10-CM | POA: Diagnosis not present

## 2015-04-11 DIAGNOSIS — Z9049 Acquired absence of other specified parts of digestive tract: Secondary | ICD-10-CM | POA: Diagnosis present

## 2015-04-11 DIAGNOSIS — E039 Hypothyroidism, unspecified: Secondary | ICD-10-CM | POA: Diagnosis present

## 2015-04-11 LAB — CBC
HCT: 31.9 % — ABNORMAL LOW (ref 35.0–47.0)
Hemoglobin: 10.8 g/dL — ABNORMAL LOW (ref 12.0–16.0)
MCH: 31.8 pg (ref 26.0–34.0)
MCHC: 33.8 g/dL (ref 32.0–36.0)
MCV: 94.3 fL (ref 80.0–100.0)
PLATELETS: 171 10*3/uL (ref 150–440)
RBC: 3.38 MIL/uL — ABNORMAL LOW (ref 3.80–5.20)
RDW: 18.5 % — AB (ref 11.5–14.5)
WBC: 12.9 10*3/uL — ABNORMAL HIGH (ref 3.6–11.0)

## 2015-04-11 LAB — TSH: TSH: 1.531 u[IU]/mL (ref 0.350–4.500)

## 2015-04-11 LAB — T4, FREE: FREE T4: 0.95 ng/dL (ref 0.61–1.12)

## 2015-04-11 MED ORDER — ONDANSETRON HCL 4 MG PO TABS: 4.0000 mg | ORAL_TABLET | Freq: Four times a day (QID) | ORAL | Status: DC | PRN

## 2015-04-11 MED ORDER — POLYETHYLENE GLYCOL 3350 17 G PO PACK
17.0000 g | PACK | Freq: Every day | ORAL | Status: DC
  Administered 2015-04-11: 17 g via ORAL
  Filled 2015-04-11: qty 1

## 2015-04-11 MED ORDER — LEVOTHYROXINE SODIUM 50 MCG PO TABS
50.0000 ug | ORAL_TABLET | Freq: Every day | ORAL | Status: DC
  Administered 2015-04-11: 09:00:00 50 ug via ORAL
  Filled 2015-04-11: qty 1

## 2015-04-11 MED ORDER — HEPARIN SOD (PORK) LOCK FLUSH 100 UNIT/ML IV SOLN
500.0000 [IU] | Freq: Once | INTRAVENOUS | Status: AC
  Administered 2015-04-11: 13:00:00 500 [IU] via INTRAVENOUS
  Filled 2015-04-11: qty 5

## 2015-04-11 MED ORDER — PANTOPRAZOLE SODIUM 40 MG PO TBEC
40.0000 mg | DELAYED_RELEASE_TABLET | Freq: Every day | ORAL | Status: DC
  Administered 2015-04-11: 40 mg via ORAL
  Filled 2015-04-11: qty 1

## 2015-04-11 MED ORDER — ACETAMINOPHEN 650 MG RE SUPP: 650.0000 mg | Freq: Four times a day (QID) | RECTAL | Status: DC | PRN

## 2015-04-11 MED ORDER — SODIUM CHLORIDE 0.9 % IV BOLUS (SEPSIS): 1000.0000 mL | Freq: Once | INTRAVENOUS | Status: DC

## 2015-04-11 MED ORDER — CEFTRIAXONE SODIUM 2 G IJ SOLR
2.0000 g | INTRAMUSCULAR | Status: DC
  Filled 2015-04-11: qty 2

## 2015-04-11 MED ORDER — DOCUSATE SODIUM 50 MG PO CAPS
50.0000 mg | ORAL_CAPSULE | Freq: Two times a day (BID) | ORAL | Status: DC
  Filled 2015-04-11 (×2): qty 1

## 2015-04-11 MED ORDER — ENOXAPARIN SODIUM 40 MG/0.4ML ~~LOC~~ SOLN
40.0000 mg | SUBCUTANEOUS | Status: DC
  Administered 2015-04-11: 02:00:00 40 mg via SUBCUTANEOUS
  Filled 2015-04-11: qty 0.4

## 2015-04-11 MED ORDER — PROPAFENONE HCL 225 MG PO TABS
225.0000 mg | ORAL_TABLET | Freq: Two times a day (BID) | ORAL | Status: DC
  Administered 2015-04-11: 225 mg via ORAL
  Filled 2015-04-11 (×4): qty 1

## 2015-04-11 MED ORDER — ACETAMINOPHEN 325 MG PO TABS
650.0000 mg | ORAL_TABLET | Freq: Four times a day (QID) | ORAL | Status: DC | PRN
  Administered 2015-04-11: 09:00:00 650 mg via ORAL
  Filled 2015-04-11: qty 2

## 2015-04-11 MED ORDER — AMOXICILLIN-POT CLAVULANATE 875-125 MG PO TABS: 1.0000 | ORAL_TABLET | Freq: Two times a day (BID) | ORAL | Status: DC

## 2015-04-11 MED ORDER — ASPIRIN EC 81 MG PO TBEC
81.0000 mg | DELAYED_RELEASE_TABLET | Freq: Every day | ORAL | Status: DC
  Filled 2015-04-11: qty 1

## 2015-04-11 MED ORDER — LORAZEPAM 0.5 MG PO TABS
0.5000 mg | ORAL_TABLET | Freq: Every day | ORAL | Status: DC
  Administered 2015-04-11: 02:00:00 0.25 mg via ORAL
  Filled 2015-04-11: qty 1

## 2015-04-11 NOTE — Progress Notes (Signed)
Patient discharged home with husband via wheelchair by nursing staff. Aleeyah Bensen S, RN  

## 2015-04-11 NOTE — Progress Notes (Signed)
Discharge instructions given and went over with patient at bedside. All questions answered. Prescription sent electronically to patients pharmacy and is ready to be picked up by patient. Patient to be discharged home with husband. Awaiting transportation. Madlyn Frankel, RN

## 2015-04-11 NOTE — Discharge Summary (Signed)
[place] [facility] [place] NAME: Jamie Conner    MR#:  [id number]  DATE OF BIRTH:  [birth year]  DATE OF ADMISSION:  04/10/2015 ADMITTING PHYSICIAN: Juluis Mire, MD  DATE OF DISCHARGE: 04/11/2015  PRIMARY CARE PHYSICIAN: Wilhemena Durie, MD    ADMISSION DIAGNOSIS:  SIRS (systemic inflammatory response syndrome) [A41.9] Fever, unspecified fever cause [R50.9]  DISCHARGE DIAGNOSIS:  Principal Problem:   Fever Active Problems:   Ovarian cancer   Hypothyroid   SECONDARY DIAGNOSIS:   Past Medical History  Diagnosis Date  . Atrial fibrillation   . Hyperlipidemia   . PVC (premature ventricular contraction)   . Mitral insufficiency   . GERD (gastroesophageal reflux disease)   . Cancer of breast     Left  . Hypothyroidism   . Ovarian cancer     s/p BSO optimal tumor debulking May 2016    HOSPITAL COURSE:   74 year old female with history of ovarian cancer undergoing chemotherapy, paroxysmal atrial fibrillation, hypothyroidism presents to the emergency room with the complaints of one day duration of chills with fever.  1. Fever with chills, source unknown. Patient known history of ovarian cancer undergoing chemotherapy. Patient's port site was clean and intact. Blood cultures are negative to date. She was recently given Neupogen which may explain the increased but blood cell count. She was complaining of a sore throat however and did exhibit LAD. Neck x-ray did not show soft tissue swelling. I spoke with Dr. Grayland Ormond regarding the patient's case. Since the patient was afebrile and has close follow-up she will be discharged on by mouth Augmentin and follow up with oncology within this week. She was advised if she has fevers at home or swallowing difficulty to come back to the emergency room.   2. Ovarian cancer undergoing chemotherapy per oncology. she will follow up with oncology on August 29.   3. Proximal atrial  fibrillation, patient in sinus rhythm, no acute problems. Continue Rythmol. 4. Hypothyroidism on Synthroid. Ultrasound performed this am.  5. History of hypertension, not on any medications. BP WNL.  DISCHARGE CONDITIONS AND DIET:  Stable Regular diet  CONSULTS OBTAINED:  Treatment Team:  Lloyd Huger, MD  DRUG ALLERGIES:   Allergies  Allergen Reactions  . Carafate [Sucralfate] Rash  . Sulfa Antibiotics Rash    DISCHARGE MEDICATIONS:   Current Discharge Medication List    START taking these medications   Details  amoxicillin-clavulanate (AUGMENTIN) 875-125 MG per tablet Take 1 tablet by mouth 2 (two) times daily. Qty: 16 tablet, Refills: 0      CONTINUE these medications which have NOT CHANGED   Details  acetaminophen (TYLENOL) 500 MG chewable tablet Chew 500 mg by mouth every 6 (six) hours as needed for pain.    docusate sodium (COLACE) 50 MG capsule Take 50 mg by mouth 2 (two) times daily.    levofloxacin (LEVAQUIN) 500 MG tablet Take 1 tablet (500 mg total) by mouth daily. Qty: 5 tablet, Refills: 0   Associated Diagnoses: Ovarian cancer, left    levothyroxine (SYNTHROID, LEVOTHROID) 50 MCG tablet Take 50 mcg by mouth daily before breakfast.    lidocaine-prilocaine (EMLA) cream Apply 1 application topically as needed. Qty: 30 g, Refills: 0   Associated Diagnoses: Ovarian cancer, left    LORazepam (ATIVAN) 0.5 MG tablet Take 1 tablet (0.5 mg total) by mouth at bedtime. Qty: 30 tablet, Refills: 3   Associated Diagnoses: Ovarian cancer, unspecified laterality    ondansetron (ZOFRAN)  4 MG tablet Take 1 tablet (4 mg total) by mouth every 6 (six) hours as needed for nausea or vomiting. Qty: 30 tablet, Refills: 3   Associated Diagnoses: Ovarian cancer, unspecified laterality    pantoprazole (PROTONIX) 40 MG tablet Take 40 mg by mouth daily.    polyethylene glycol (MIRALAX / GLYCOLAX) packet Take 17 g by mouth daily.    propafenone (RYTHMOL) 225 MG tablet  Take 225 mg by mouth 2 (two) times daily.              Today   CHIEF COMPLAINT:  Doing well this am +sore throat right side of neck she noticed was slightly painful no ear pain no fevers overnight   VITAL SIGNS:  Blood pressure 115/50, pulse 83, temperature 98.7 F (37.1 C), temperature source Oral, resp. rate 18, height 5\' 4"  (1.626 m), weight 75.932 kg (167 lb 6.4 oz), SpO2 99 %.   REVIEW OF SYSTEMS:  Review of Systems  Constitutional: Negative for fever, chills and malaise/fatigue.  HENT: Positive for sore throat.   Eyes: Negative for blurred vision.  Respiratory: Negative for cough, hemoptysis, shortness of breath and wheezing.   Cardiovascular: Negative for chest pain, palpitations and leg swelling.  Gastrointestinal: Negative for nausea, vomiting, abdominal pain, diarrhea and blood in stool.  Genitourinary: Negative for dysuria.  Musculoskeletal: Negative for back pain.  Neurological: Negative for dizziness, tremors and headaches.  Endo/Heme/Allergies: Does not bruise/bleed easily.     PHYSICAL EXAMINATION:  GENERAL:  74 y.o.-year-old patient lying in the bed with no acute distress.  NECK:  Supple, no jugular venous distention. No thyroid enlargement, right LAD lower right neck not tender but slightly swollen.  LUNGS: Normal breath sounds bilaterally, no wheezing, rales,rhonchi  No use of accessory muscles of respiration.  CARDIOVASCULAR: S1, S2 normal. No murmurs, rubs, or gallops.  ABDOMEN: Soft, non-tender, non-distended. Bowel sounds present. No organomegaly or mass.  EXTREMITIES: No pedal edema, cyanosis, or clubbing.  PSYCHIATRIC: The patient is alert and oriented x 3.  SKIN: No obvious rash, lesion, or ulcer.   DATA REVIEW:   CBC  Recent Labs Lab 04/11/15 0502  WBC 12.9*  HGB 10.8*  HCT 31.9*  PLT 171    Chemistries   Recent Labs Lab 04/04/15 1127 04/10/15 2130  NA 136 139  K 4.1 4.0  CL 105 108  CO2 24 24  GLUCOSE 92 108*  BUN 15  12  CREATININE 0.84 0.84  CALCIUM 9.2 9.0  MG 1.8  --   AST 19 20  ALT 18 14  ALKPHOS 88 102  BILITOT 0.4 <0.1*    Cardiac Enzymes No results for input(s): TROPONINI in the last 168 hours.  Microbiology Results  @[email or web address] @  RADIOLOGY:  Dg Neck Soft Tissue  04/11/2015   CLINICAL DATA:  Anterior throat pain radiating to the collarbone, starting earlier this week. No known injury. History of ovarian cancer on chemotherapy.  EXAM: NECK SOFT TISSUES - 1+ VIEW  COMPARISON:  None.  FINDINGS: There is no evidence of retropharyngeal soft tissue swelling or epiglottic enlargement. The cervical airway is unremarkable and no radio-opaque foreign body identified.  IMPRESSION: Negative.   Electronically Signed   By: Lucienne Capers M.D.   On: 04/11/2015 00:08   Dg Chest 2 View  04/11/2015   CLINICAL DATA:  Fever, throat pain, and weakness. History of ovarian cancer on chemotherapy.  EXAM: CHEST  2 VIEW  COMPARISON:  12/22/2014  FINDINGS: Power port type central venous catheter with tip  over the lower SVC region. No pneumothorax. The heart size and mediastinal contours are within normal limits. Both lungs are clear. The visualized skeletal structures are unremarkable.  IMPRESSION: No active cardiopulmonary disease.   Electronically Signed   By: Lucienne Capers M.D.   On: 04/11/2015 00:10      Management plans discussed with the patient and she is in agreement. Stable for discharge home  Patient should follow up with Dr Paulino Door 8/29  CODE STATUS:     Code Status Orders        Start     Ordered   04/11/15 0133  Full code   Continuous     04/11/15 0133    Advance Directive Documentation        Most Recent Value   Type of Advance Directive  Healthcare Power of [place], Living will   Pre-existing out of facility DNR order (yellow form or pink MOST form)     "MOST" Form in Place?        TOTAL TIME TAKING CARE OF THIS PATIENT: 35 minutes.    Noell Lorensen M.D on 04/11/2015 at 10:43  AM  Between 7am to 6pm - Pager - [phone] After 6pm go to [email or web address] - password [email or web address] [facility]  Office  [phone]  CC: Primary care physician; Wilhemena Durie, MD

## 2015-04-11 NOTE — Plan of Care (Signed)
Problem: Discharge Progression Outcomes Goal: Barriers To Progression Addressed/Resolved Individualization Pt admitted from ED with 1 day history of fever post chemo on 04/04/15 Medical Hx of Ovarian Cancer

## 2015-04-11 NOTE — H&P (Signed)
[place] [facility] [place] NAME: Jamie Conner    MR#:  [id number]  DATE OF BIRTH:  [birth year]  DATE OF ADMISSION:  04/10/2015  PRIMARY CARE PHYSICIAN: Wilhemena Durie, MD   REQUESTING/REFERRING PHYSICIAN: Quale  CHIEF COMPLAINT:   Chief Complaint  Patient presents with  . Fever    HISTORY OF PRESENT ILLNESS:  Jamie Conner  is a 74 y.o. female with a known history of ovarian cancer undergoing chemotherapy, paroxysmal atrial fibrillation, hypothyroidism presents to the emergency room with the complaints of chills with fever of one day duration. Denies any cough, chest pain, shortness of breath, nausea, vomiting, diarrhea, abdominal pain, dysuria. Also complains of pain in the anterior neck of 2 days' duration. Evaluation in the ED revealed stable vital signs, low-grade temp of 100.1 and pulse rate of 10 8 bpm. Lab work significant for elevated WBC of 14.6. Chest x-ray negative for an acute cardiovascular pathology. X-ray of the neck negative for any acute pathology. Oncology on call was consulted by the ED physician and upon oncology advice patient was started on IV ceftriaxone of obtaining blood cultures and hospitalist service was consulted for admission for further management. Patient is comfortably resting in the bed at this time and denies any complains except for mild anterior neck pain.  PAST MEDICAL HISTORY:   Past Medical History  Diagnosis Date  . Atrial fibrillation   . Hyperlipidemia   . PVC (premature ventricular contraction)   . Mitral insufficiency   . GERD (gastroesophageal reflux disease)   . Cancer of breast     Left  . Hypothyroidism   . Ovarian cancer     s/p BSO optimal tumor debulking May 2016    PAST SURGICAL HISTORY:   Past Surgical History  Procedure Laterality Date  . Cesarean section      x2  . Abdominal hysterectomy    . Cholecystectomy    . Colonoscopy  2006  . Breast surgery      Left  .  Laparotomy with staging N/A 12/22/2014    Procedure: LAPAROTOMY WITH STAGING;  Surgeon: Gillis Ends, MD;  Location: [facility] ORS;  Service: Gynecology;  Laterality: N/A;  . Salpingoophorectomy Bilateral 12/22/2014    Procedure: SALPINGO OOPHORECTOMY;  Surgeon: Gillis Ends, MD;  Location: [facility];  Service: Gynecology;  Laterality: Bilateral;  . Debulking N/A 12/22/2014    Procedure: DEBULKING;  Surgeon: Gillis Ends, MD;  Location: [facility];  Service: Gynecology;  Laterality: N/A;  . Laparotomy N/A 12/22/2014    Procedure: EXPLORATORY LAPAROTOMY;  Surgeon: Robert Bellow, MD;  Location: [facility];  Service: General;  Laterality: N/A;  . Portacath placement Right 12/22/2014    Procedure: INSERTION PORT-A-CATH;  Surgeon: Robert Bellow, MD;  Location: [facility];  Service: General;  Laterality: Right;  . Peripheral vascular catheterization Left 12/22/2014    Procedure: PORTA CATH INSERTION;  Surgeon: Robert Bellow, MD;  Location: [facility];  Service: General;  Laterality: Left;  . Bilateral salpingoophorectomy  May 2016    with optimal tumor debulking     SOCIAL HISTORY:   Social History  Substance Use Topics  . Smoking status: Never Smoker   . Smokeless tobacco: Never Used  . Alcohol Use: No    FAMILY HISTORY:   Family History  Problem Relation Age of Onset  . Cancer Mother   . Heart disease Father   . Pulmonary embolism Sister   . Pulmonary embolism Other   .  Cancer Other     DRUG ALLERGIES:   Allergies  Allergen Reactions  . Carafate [Sucralfate] Rash  . Sulfa Antibiotics Rash    REVIEW OF SYSTEMS:   Review of Systems  Constitutional: Positive for fever and chills. Negative for malaise/fatigue.  HENT: Negative for ear pain, hearing loss, nosebleeds, sore throat and tinnitus.        Anterior neck pain + as noted in history of present illness  Eyes: Negative for blurred vision, double vision, pain, discharge and redness.  Respiratory: Negative  for cough, hemoptysis, sputum production, shortness of breath and wheezing.   Cardiovascular: Negative for chest pain, palpitations, orthopnea and leg swelling.  Gastrointestinal: Negative for nausea, vomiting, abdominal pain, diarrhea, constipation, blood in stool and melena.  Genitourinary: Negative for dysuria, urgency, frequency and hematuria.  Musculoskeletal: Negative for back pain, joint pain and neck pain.  Skin: Negative for itching and rash.  Neurological: Negative for dizziness, tingling, sensory change, focal weakness and seizures.  Endo/Heme/Allergies: Does not bruise/bleed easily.  Psychiatric/Behavioral: Negative for depression. The patient is not nervous/anxious.     MEDICATIONS AT HOME:   Prior to Admission medications   Medication Sig Start Date End Date Taking? Authorizing Provider  acetaminophen (TYLENOL) 500 MG chewable tablet Chew 500 mg by mouth every 6 (six) hours as needed for pain.   Yes Historical Provider, MD  docusate sodium (COLACE) 50 MG capsule Take 50 mg by mouth 2 (two) times daily.   Yes Historical Provider, MD  levofloxacin (LEVAQUIN) 500 MG tablet Take 1 tablet (500 mg total) by mouth daily. 03/14/15  Yes Forest Gleason, MD  levothyroxine (SYNTHROID, LEVOTHROID) 50 MCG tablet Take 50 mcg by mouth daily before breakfast.   Yes Historical Provider, MD  lidocaine-prilocaine (EMLA) cream Apply 1 application topically as needed. 01/04/15  Yes Forest Gleason, MD  LORazepam (ATIVAN) 0.5 MG tablet Take 1 tablet (0.5 mg total) by mouth at bedtime. 01/12/15  Yes Forest Gleason, MD  ondansetron (ZOFRAN) 4 MG tablet Take 1 tablet (4 mg total) by mouth every 6 (six) hours as needed for nausea or vomiting. 01/12/15  Yes Forest Gleason, MD  pantoprazole (PROTONIX) 40 MG tablet Take 40 mg by mouth daily.   Yes Historical Provider, MD  polyethylene glycol (MIRALAX / GLYCOLAX) packet Take 17 g by mouth daily.   Yes Historical Provider, MD  propafenone (RYTHMOL) 225 MG tablet Take 225  mg by mouth 2 (two) times daily.   Yes Historical Provider, MD      VITAL SIGNS:  Blood pressure 131/63, pulse 76, temperature 98 F (36.7 C), temperature source Oral, resp. rate 18, height 5\' 4"  (1.626 m), weight 77.111 kg (170 lb), SpO2 96 %.  PHYSICAL EXAMINATION:  Physical Exam  Constitutional: She is oriented to person, place, and time. She appears well-developed and well-nourished. No distress.  HENT:  Head: Normocephalic and atraumatic.  Right Ear: External ear normal.  Left Ear: External ear normal.  Nose: Nose normal.  Mouth/Throat: Oropharynx is clear and moist. No oropharyngeal exudate.  Eyes: EOM are normal. Pupils are equal, round, and reactive to light. No scleral icterus.  Neck: Normal range of motion. Neck supple. No JVD present. No thyromegaly present.  Mild tenderness the anterior neck and at the lower end.  Cardiovascular: Normal rate, regular rhythm, normal heart sounds and intact distal pulses.  Exam reveals no friction rub.   No murmur heard. Respiratory: Effort normal and breath sounds normal. No respiratory distress. She has no wheezes. She has no  rales. She exhibits no tenderness.  GI: Soft. Bowel sounds are normal. She exhibits no distension and no mass. There is no tenderness. There is no rebound and no guarding.  Musculoskeletal: Normal range of motion. She exhibits no edema.  Lymphadenopathy:    She has no cervical adenopathy.  Neurological: She is alert and oriented to person, place, and time. She has normal reflexes. She displays normal reflexes. No cranial nerve deficit. She exhibits normal muscle tone.  Skin: Skin is warm. No rash noted. No erythema.  Psychiatric: She has a normal mood and affect. Her behavior is normal. Thought content normal.   LABORATORY PANEL:   CBC  Recent Labs Lab 04/10/15 2130  WBC 14.6*  HGB 11.9*  HCT 35.8  PLT 187    ------------------------------------------------------------------------------------------------------------------  Chemistries   Recent Labs Lab 04/04/15 1127 04/10/15 2130  NA 136 139  K 4.1 4.0  CL 105 108  CO2 24 24  GLUCOSE 92 108*  BUN 15 12  CREATININE 0.84 0.84  CALCIUM 9.2 9.0  MG 1.8  --   AST 19 20  ALT 18 14  ALKPHOS 88 102  BILITOT 0.4 <0.1*   ------------------------------------------------------------------------------------------------------------------  Cardiac Enzymes No results for input(s): TROPONINI in the last 168 hours. ------------------------------------------------------------------------------------------------------------------  RADIOLOGY:  Dg Neck Soft Tissue  04/11/2015   CLINICAL DATA:  Anterior throat pain radiating to the collarbone, starting earlier this week. No known injury. History of ovarian cancer on chemotherapy.  EXAM: NECK SOFT TISSUES - 1+ VIEW  COMPARISON:  None.  FINDINGS: There is no evidence of retropharyngeal soft tissue swelling or epiglottic enlargement. The cervical airway is unremarkable and no radio-opaque foreign body identified.  IMPRESSION: Negative.   Electronically Signed   By: Lucienne Capers M.D.   On: 04/11/2015 00:08   Dg Chest 2 View  04/11/2015   CLINICAL DATA:  Fever, throat pain, and weakness. History of ovarian cancer on chemotherapy.  EXAM: CHEST  2 VIEW  COMPARISON:  12/22/2014  FINDINGS: Power port type central venous catheter with tip over the lower SVC region. No pneumothorax. The heart size and mediastinal contours are within normal limits. Both lungs are clear. The visualized skeletal structures are unremarkable.  IMPRESSION: No active cardiopulmonary disease.   Electronically Signed   By: Lucienne Capers M.D.   On: 04/11/2015 00:10    EKG:   Orders placed or performed in visit on 10/04/09  . EKG 12-Lead  Normal sinus rhythm with ventricular rate of 88 bpm, left anterior fascicular  block.  IMPRESSION AND PLAN:   74 year old female with history of ovarian cancer undergoing chemotherapy, paroxysmal atrial fibrillation, hypothyroidism presents to the emergency room with the complaints of one day duration of chills with fever. 1. Fever with chills, source unknown. Patient known history of ovarian cancer undergoing chemotherapy. Rule out bacteremia. Plan: Admit, continue IV antibiotics-ceftriaxone, follow up CBC, blood cultures.  2. Ovarian cancer undergoing chemotherapy per oncology. Oncology consultation requested, continue care per oncology. 3. Proximal atrial fibrillation, patient in sinus rhythm, no acute problems. Continue Rythmol. 4. Hypothyroidism on Synthroid. Complains of anterior neck pain, exam and neck x-ray unremarkable. Check TSH, obtain thyroid ultrasound because of anterior neck pain for evaluation of thyroiditis. 5. History of hypertension, not on any medications. BP WNL. Monitor    All the records are reviewed and case discussed with ED provider. Management plans discussed with the patient, family and they are in agreement.  CODE STATUS: Full code  TOTAL TIME TAKING CARE OF THIS PATIENT:  50 minutes.    Azucena Freed N M.D on 04/11/2015 at 12:51 AM  Between 7am to 6pm - Pager - [phone]  After 6pm go to [email or web address] - password [email or web address] [facility]  Office  [phone]  CC: Primary care physician; Wilhemena Durie, MD

## 2015-04-11 NOTE — ED Provider Notes (Signed)
[facility] Emergency Department Provider Note  ____________________________________________  Time seen: Approximately 10:00 PM  I have reviewed the triage vital signs and the nursing notes.   HISTORY  Chief Complaint Fever    HPI Jamie Conner is a 74 y.o. female history of ovarian cancer currently on chemotherapy. She reports that today she started to feel chilled and developed a low-grade temperature to approximately 100.1 at home. She reports she called the oncology, advised her to come emergency room. She reports she slowly started developing pain in the front of her throat, and upper chest. It hurts when she takes a swallow. Congestion and she may have swollen glands.  No cough. No chest pain. No trouble breathing. No abdominal pain nausea or vomiting. No diarrhea. No rash.   Past Medical History  Diagnosis Date  . Atrial fibrillation   . Hyperlipidemia   . PVC (premature ventricular contraction)   . Mitral insufficiency   . GERD (gastroesophageal reflux disease)   . Cancer of breast     Left  . Hypothyroidism   . Ovarian cancer     s/p BSO optimal tumor debulking May 2016    Patient Active Problem List   Diagnosis Date Noted  . Benign essential HTN 02/01/2015  . Retroperitoneal fibrosis   . Combined fat and carbohydrate induced hyperlipemia 01/04/2015  . Awareness of heartbeats 01/04/2015  . Beat, premature ventricular 01/04/2015  . Ovarian cancer 12/16/2014  . MI (mitral incompetence) 09/02/2014  . AF (paroxysmal atrial fibrillation) 09/02/2014    Past Surgical History  Procedure Laterality Date  . Cesarean section      x2  . Abdominal hysterectomy    . Cholecystectomy    . Colonoscopy  2006  . Breast surgery      Left  . Laparotomy with staging N/A 12/22/2014    Procedure: LAPAROTOMY WITH STAGING;  Surgeon: Gillis Ends, MD;  Location: [facility] ORS;  Service: Gynecology;  Laterality: N/A;  . Salpingoophorectomy  Bilateral 12/22/2014    Procedure: SALPINGO OOPHORECTOMY;  Surgeon: Gillis Ends, MD;  Location: [facility];  Service: Gynecology;  Laterality: Bilateral;  . Debulking N/A 12/22/2014    Procedure: DEBULKING;  Surgeon: Gillis Ends, MD;  Location: [facility] OR[facility];  Service: Gynecology;  Laterality: N/A;  . Laparotomy N/A 12/22/2014    Procedure: EXPLORATORY LAPAROTOMY;  Surgeon: Robert Bellow, MD;  Location: [facility] OR[facility];  Service: General;  Laterality: N/A;  . Portacath placement Right 12/22/2014    Procedure: INSERTION PORT-A-CATH;  Surgeon: Robert Bellow, MD;  Location: [facility] OR[facility];  Service: General;  Laterality: Right;  . Peripheral vascular catheterization Left 12/22/2014    Procedure: PORTA CATH INSERTION;  Surgeon: Robert Bellow, MD;  Location: [facility];  Service: General;  Laterality: Left;  . Bilateral salpingoophorectomy  May 2016    with optimal tumor debulking     Current Outpatient Rx  Name  Route  Sig  Dispense  Refill  . acetaminophen (TYLENOL) 500 MG chewable tablet   Oral   Chew 500 mg by mouth every 6 (six) hours as needed for pain.         Marland Kitchen docusate sodium (COLACE) 50 MG capsule   Oral   Take 50 mg by mouth 2 (two) times daily.         Marland Kitchen levofloxacin (LEVAQUIN) 500 MG tablet   Oral   Take 1 tablet (500 mg total) by mouth daily.   5 tablet   0   .  levothyroxine (SYNTHROID, LEVOTHROID) 50 MCG tablet   Oral   Take 50 mcg by mouth daily before breakfast.         . lidocaine-prilocaine (EMLA) cream   Topical   Apply 1 application topically as needed.   30 g   0   . LORazepam (ATIVAN) 0.5 MG tablet   Oral   Take 1 tablet (0.5 mg total) by mouth at bedtime.   30 tablet   3   . ondansetron (ZOFRAN) 4 MG tablet   Oral   Take 1 tablet (4 mg total) by mouth every 6 (six) hours as needed for nausea or vomiting.   30 tablet   3   . pantoprazole (PROTONIX) 40 MG tablet   Oral   Take 40 mg by mouth daily.         . polyethylene glycol  (MIRALAX / GLYCOLAX) packet   Oral   Take 17 g by mouth daily.         . propafenone (RYTHMOL) 225 MG tablet   Oral   Take 225 mg by mouth 2 (two) times daily.           Allergies Carafate and Sulfa antibiotics  Family History  Problem Relation Age of Onset  .       Social History Social History  Substance Use Topics  . Smoking status: Never Smoker   . Smokeless tobacco: Never Used  . Alcohol Use: No    Review of Systems Constitutional: See history of present illness Eyes: No visual changes. ENT: Mild sore throat, when she swallows it hurts in the front of her throat. Cardiovascular: Denies chest pain. Respiratory: Denies shortness of breath. Gastrointestinal: No abdominal pain.  No nausea, no vomiting.  No diarrhea.  No constipation. Genitourinary: Negative for dysuria. Musculoskeletal: Negative for back pain. Skin: Negative for rash. Neurological: Negative for headaches, focal weakness or numbness.  10-point ROS otherwise negative.  ____________________________________________   PHYSICAL EXAM:  VITAL SIGNS: ED Triage Vitals  Enc Vitals Group     BP 04/10/15 2031 129/73 mmHg     Pulse Rate 04/10/15 2031 113     Resp 04/10/15 2031 18     Temp 04/10/15 2031 99.6 F (37.6 C)     Temp Source 04/10/15 2031 Oral     SpO2 04/10/15 2031 97 %     Weight 04/10/15 2031 170 lb (77.111 kg)     Height 04/10/15 2031 5\' 4"  (1.626 m)     Head Cir --      Peak Flow --      Pain Score 04/10/15 2033 6     Pain Loc --      Pain Edu? --      Excl. in [place]? --     Constitutional: Alert and oriented. Fatigued appearing, in no distress but appears slightly diaphoretic. Eyes: Conjunctivae are normal. PERRL. EOMI. Head: Atraumatic. Nose: No congestion/rhinnorhea. Mouth/Throat: Mucous membranes are slightly dry.  Oropharynx non-erythematous. Neck: No stridor.  Patient has some slight adenopathy in the anterior cervical chain, but nontender. Patient does have tenderness  over the inferior neck midline, and when she swallows the pain is worsened. Question if she may have some thyroiditis. No stridor. No difficulty breathing. No neck swelling. Cardiovascular: Slight tachycardia. Grossly normal heart sounds.  Good peripheral circulation. Respiratory: Normal respiratory effort.  No retractions. Lungs CTAB. Gastrointestinal: Soft and nontender. No distention. No abdominal bruits. No CVA tenderness. Musculoskeletal: No lower extremity tenderness nor edema.  No joint  effusions. Neurologic:  Normal speech and language. No gross focal neurologic deficits are appreciated. Skin:  Skin is warm, dry and intact. No rash noted. Psychiatric: Mood and affect are normal. Speech and behavior are normal.  ____________________________________________   LABS (all labs ordered are listed, but only abnormal results are displayed)  Labs Reviewed  CBC WITH DIFFERENTIAL/PLATELET - Abnormal; Notable for the following:    WBC 14.6 (*)    Hemoglobin 11.9 (*)    RDW 18.3 (*)    Neutro Abs 9.9 (*)    Monocytes Absolute 1.3 (*)    Basophils Absolute 0.2 (*)    All other components within normal limits  COMPREHENSIVE METABOLIC PANEL - Abnormal; Notable for the following:    Glucose, Bld 108 (*)    Total Protein 6.1 (*)    Albumin 3.2 (*)    Total Bilirubin <0.1 (*)    All other components within normal limits  URINALYSIS COMPLETEWITH MICROSCOPIC ([facility] ONLY) - Abnormal; Notable for the following:    Color, Urine STRAW (*)    APPearance CLEAR (*)    Hgb urine dipstick 1+ (*)    Bacteria, UA RARE (*)    All other components within normal limits  CULTURE, BLOOD (ROUTINE X 2)  CULTURE, BLOOD (ROUTINE X 2)  LIPASE, BLOOD  LACTIC ACID, PLASMA  LACTIC ACID, PLASMA  TSH  T4, FREE   ____________________________________________  EKG   ____________________________________________  RADIOLOGY   DG Neck Soft Tissue (Final result) Result time: 04/11/15 00:08:09   Final result by  Rad Results In Interface (04/11/15 00:08:09)   Narrative:   CLINICAL DATA: Anterior throat pain radiating to the collarbone, starting earlier this week. No known injury. History of ovarian cancer on chemotherapy.  EXAM: NECK SOFT TISSUES - 1+ VIEW  COMPARISON: None.  FINDINGS: There is no evidence of retropharyngeal soft tissue swelling or epiglottic enlargement. The cervical airway is unremarkable and no radio-opaque foreign body identified.  IMPRESSION: Negative.   Electronically Signed By: Lucienne Capers M.D. On: 04/11/2015 00:08          DG Chest 2 View (Final result) Result time: 04/11/15 00:10:05   Final result by Rad Results In Interface (04/11/15 00:10:05)   Narrative:   CLINICAL DATA: Fever, throat pain, and weakness. History of ovarian cancer on chemotherapy.  EXAM: CHEST 2 VIEW  COMPARISON: 12/22/2014  FINDINGS: Power port type central venous catheter with tip over the lower SVC region. No pneumothorax. The heart size and mediastinal contours are within normal limits. Both lungs are clear. The visualized skeletal structures are unremarkable.  IMPRESSION: No active cardiopulmonary disease.    ____________________________________________   PROCEDURES  Procedure(s) performed: None  Critical Care performed: No  ____________________________________________   INITIAL IMPRESSION / ASSESSMENT AND PLAN / ED COURSE  Pertinent labs & imaging results that were available during my care of the patient were reviewed by me and considered in my medical decision making (see chart for details).  Patient presents with low-grade fever, generalized fatigue and anterior neck pain. Presentation is slightly concerning for possible thyroiditis, and her x-rays do not demonstrate any acute etiology.  No acute cardiopulmonary symptoms. No abdominal symptoms. No rash. No headache or neck pain except over the anterior lower neck and thyroid area.  Discussed with Dr. Mike Gip of oncology, we will initiate antibiotic's after blood cultures and plan to bring her in for oncology consultation as we continued will rule out bacteremia and infection in the setting of her leukocytosis and slight tachycardia. It is,  still unclear the exact etiology but given her chemotherapy status and also being on Neulasta, it is somewhat unclear if this leukocytosis is due to infection, Neulasta or combo of the 2.  Discussed with the patient and her husband, after discussion of the benefits of hospitalization and my concern we can't rule out infection in setting of chemo, decision made to stay in the hospital this evening for oncology follow-up. ____________________________________________   FINAL CLINICAL IMPRESSION(S) / ED DIAGNOSES  Final diagnoses:  Fever, unspecified fever cause  SIRS (systemic inflammatory response syndrome)      Delman Kitten, MD 04/11/15 0021

## 2015-04-11 NOTE — Progress Notes (Signed)
ANTIBIOTIC CONSULT NOTE - INITIAL  Pharmacy Consult for ceftriaxone Indication: FUO  Allergies  Allergen Reactions  . Carafate [Sucralfate] Rash  . Sulfa Antibiotics Rash    Patient Measurements: Height: 5\' 4"  (162.6 cm) Weight: 167 lb 6.4 oz (75.932 kg) IBW/kg (Calculated) : 54.7 Adjusted Body Weight: 77.1 kg  Vital Signs: Temp: 98.6 F (37 C) (08/22 0126) Temp Source: Oral (08/22 0126) BP: 130/57 mmHg (08/22 0126) Pulse Rate: 84 (08/22 0126) Intake/Output from previous day:   Intake/Output from this shift:    Labs:  Recent Labs  04/10/15 2130  WBC 14.6*  HGB 11.9*  PLT 187  CREATININE 0.84   Estimated Creatinine Clearance: 61.3 mL/min (by C-G formula based on Cr of 0.84). No results for input(s): VANCOTROUGH, VANCOPEAK, VANCORANDOM, GENTTROUGH, GENTPEAK, GENTRANDOM, TOBRATROUGH, TOBRAPEAK, TOBRARND, AMIKACINPEAK, AMIKACINTROU, AMIKACIN in the last 72 hours.   Microbiology: No results found for this or any previous visit (from the past 720 hour(s)).  Medical History: Past Medical History  Diagnosis Date  . Atrial fibrillation   . Hyperlipidemia   . PVC (premature ventricular contraction)   . Mitral insufficiency   . GERD (gastroesophageal reflux disease)   . Cancer of breast     Left  . Hypothyroidism   . Ovarian cancer     s/p BSO optimal tumor debulking May 2016    Medications:  Infusions:   Assessment: 73  yof cc fever with PMH ovarian cancer on chemotherapy undergoing chemotherapy. WBC 14.6 ANC 9.9, Tmax 99.6 here.  Goal of Therapy:  Resolve fever  Plan:  Ceftriaxone 2 gm IV Q24H. Will follow up on culture/sensitivities from blood cultures.   Laural Benes, Pharm.D. Clinical Pharmacist 04/11/2015,1:44 AM

## 2015-04-13 ENCOUNTER — Other Ambulatory Visit: Payer: Self-pay | Admitting: Family Medicine

## 2015-04-13 ENCOUNTER — Telehealth: Payer: Self-pay | Admitting: *Deleted

## 2015-04-13 NOTE — Telephone Encounter (Signed)
Called patient back to see if she is on an oral abx.  States she was given Amoxicillin bid for 8 days.  States while at the ED they did a CXR, X-ray and [place] of her throat and drew labs.  WBC 12.9.  States thyroid was enlarged.  Per Magda Paganini, patient should continue her abx and keep her appointment Monday.  Notified patient to continue and complete her abx and keep scheduled appointment. Patient verbalized understanding.

## 2015-04-13 NOTE — Telephone Encounter (Signed)
Patient was seen in ED on Sunday and admitted for fever >100.  States she was discharged on Monday and ran temp again on Monday and Tuesday nights.  States temp is normal now. However, she is c/o a sore area in her throat that is not a typical sore throat.  She has chemo treatment on Monday and wants to know if something more needs to be done before then.

## 2015-04-15 ENCOUNTER — Other Ambulatory Visit: Payer: Self-pay | Admitting: *Deleted

## 2015-04-15 DIAGNOSIS — C569 Malignant neoplasm of unspecified ovary: Secondary | ICD-10-CM

## 2015-04-16 LAB — CULTURE, BLOOD (ROUTINE X 2)
CULTURE: NO GROWTH
Culture: NO GROWTH

## 2015-04-18 ENCOUNTER — Inpatient Hospital Stay: Payer: PPO

## 2015-04-18 ENCOUNTER — Inpatient Hospital Stay (HOSPITAL_BASED_OUTPATIENT_CLINIC_OR_DEPARTMENT_OTHER): Payer: PPO | Admitting: Oncology

## 2015-04-18 ENCOUNTER — Encounter: Payer: Self-pay | Admitting: Oncology

## 2015-04-18 VITALS — BP 135/82 | HR 77 | Temp 96.3°F | Wt 172.2 lb

## 2015-04-18 DIAGNOSIS — Z90722 Acquired absence of ovaries, bilateral: Secondary | ICD-10-CM

## 2015-04-18 DIAGNOSIS — I340 Nonrheumatic mitral (valve) insufficiency: Secondary | ICD-10-CM

## 2015-04-18 DIAGNOSIS — I4891 Unspecified atrial fibrillation: Secondary | ICD-10-CM | POA: Diagnosis not present

## 2015-04-18 DIAGNOSIS — C569 Malignant neoplasm of unspecified ovary: Secondary | ICD-10-CM

## 2015-04-18 DIAGNOSIS — Z79899 Other long term (current) drug therapy: Secondary | ICD-10-CM | POA: Diagnosis not present

## 2015-04-18 DIAGNOSIS — Z9223 Personal history of estrogen therapy: Secondary | ICD-10-CM | POA: Diagnosis not present

## 2015-04-18 DIAGNOSIS — C562 Malignant neoplasm of left ovary: Secondary | ICD-10-CM

## 2015-04-18 DIAGNOSIS — R1030 Lower abdominal pain, unspecified: Secondary | ICD-10-CM

## 2015-04-18 DIAGNOSIS — Z923 Personal history of irradiation: Secondary | ICD-10-CM

## 2015-04-18 DIAGNOSIS — E785 Hyperlipidemia, unspecified: Secondary | ICD-10-CM

## 2015-04-18 DIAGNOSIS — Z853 Personal history of malignant neoplasm of breast: Secondary | ICD-10-CM | POA: Diagnosis not present

## 2015-04-18 DIAGNOSIS — Z883 Allergy status to other anti-infective agents status: Secondary | ICD-10-CM

## 2015-04-18 DIAGNOSIS — R531 Weakness: Secondary | ICD-10-CM | POA: Diagnosis not present

## 2015-04-18 DIAGNOSIS — C561 Malignant neoplasm of right ovary: Secondary | ICD-10-CM

## 2015-04-18 DIAGNOSIS — R040 Epistaxis: Secondary | ICD-10-CM | POA: Diagnosis not present

## 2015-04-18 DIAGNOSIS — Z9889 Other specified postprocedural states: Secondary | ICD-10-CM

## 2015-04-18 DIAGNOSIS — E039 Hypothyroidism, unspecified: Secondary | ICD-10-CM

## 2015-04-18 DIAGNOSIS — Z5111 Encounter for antineoplastic chemotherapy: Secondary | ICD-10-CM | POA: Diagnosis present

## 2015-04-18 DIAGNOSIS — R5383 Other fatigue: Secondary | ICD-10-CM | POA: Diagnosis not present

## 2015-04-18 DIAGNOSIS — K219 Gastro-esophageal reflux disease without esophagitis: Secondary | ICD-10-CM | POA: Diagnosis not present

## 2015-04-18 DIAGNOSIS — L659 Nonscarring hair loss, unspecified: Secondary | ICD-10-CM | POA: Diagnosis not present

## 2015-04-18 DIAGNOSIS — D701 Agranulocytosis secondary to cancer chemotherapy: Secondary | ICD-10-CM | POA: Diagnosis not present

## 2015-04-18 DIAGNOSIS — T451X5S Adverse effect of antineoplastic and immunosuppressive drugs, sequela: Secondary | ICD-10-CM | POA: Diagnosis not present

## 2015-04-18 DIAGNOSIS — I480 Paroxysmal atrial fibrillation: Secondary | ICD-10-CM

## 2015-04-18 LAB — COMPREHENSIVE METABOLIC PANEL
ALBUMIN: 3.2 g/dL — AB (ref 3.5–5.0)
ALK PHOS: 87 U/L (ref 38–126)
ALT: 13 U/L — ABNORMAL LOW (ref 14–54)
ANION GAP: 4 — AB (ref 5–15)
AST: 15 U/L (ref 15–41)
BILIRUBIN TOTAL: 0.3 mg/dL (ref 0.3–1.2)
BUN: 13 mg/dL (ref 6–20)
CALCIUM: 8.8 mg/dL — AB (ref 8.9–10.3)
CO2: 26 mmol/L (ref 22–32)
Chloride: 107 mmol/L (ref 101–111)
Creatinine, Ser: 0.88 mg/dL (ref 0.44–1.00)
GFR calc non Af Amer: >60 mL/min (ref >60)
GLUCOSE: 90 mg/dL (ref 65–99)
Potassium: 3.6 mmol/L (ref 3.5–5.1)
Sodium: 137 mmol/L (ref 135–145)
TOTAL PROTEIN: 6.8 g/dL (ref 6.5–8.1)

## 2015-04-18 LAB — CBC WITH DIFFERENTIAL/PLATELET
Basophils Absolute: 0 10*3/uL (ref 0–0.1)
Basophils Relative: 1 %
Eosinophils Absolute: 0 10*3/uL (ref 0–0.7)
Eosinophils Relative: 1 %
HEMATOCRIT: 32.8 % — AB (ref 35.0–47.0)
HEMOGLOBIN: 11.2 g/dL — AB (ref 12.0–16.0)
LYMPHS ABS: 2.3 10*3/uL (ref 1.0–3.6)
LYMPHS PCT: 43 %
MCH: 32 pg (ref 26.0–34.0)
MCHC: 34 g/dL (ref 32.0–36.0)
MCV: 94.1 fL (ref 80.0–100.0)
MONOS PCT: 15 %
Monocytes Absolute: 0.8 10*3/uL (ref 0.2–0.9)
NEUTROS ABS: 2.1 10*3/uL (ref 1.4–6.5)
NEUTROS PCT: 40 %
Platelets: 401 10*3/uL (ref 150–440)
RBC: 3.49 MIL/uL — ABNORMAL LOW (ref 3.80–5.20)
RDW: 18 % — ABNORMAL HIGH (ref 11.5–14.5)
WBC: 5.3 10*3/uL (ref 3.6–11.0)

## 2015-04-18 LAB — MAGNESIUM: Magnesium: 2 mg/dL (ref 1.7–2.4)

## 2015-04-18 MED ORDER — PEGFILGRASTIM 6 MG/0.6ML ~~LOC~~ PSKT
6.0000 mg | PREFILLED_SYRINGE | Freq: Once | SUBCUTANEOUS | Status: AC
  Administered 2015-04-18: 6 mg via SUBCUTANEOUS
  Filled 2015-04-18: qty 0.6

## 2015-04-18 MED ORDER — PALONOSETRON HCL INJECTION 0.25 MG/5ML
0.2500 mg | Freq: Once | INTRAVENOUS | Status: AC
  Administered 2015-04-18: 0.25 mg via INTRAVENOUS
  Filled 2015-04-18: qty 5

## 2015-04-18 MED ORDER — HEPARIN SOD (PORK) LOCK FLUSH 100 UNIT/ML IV SOLN
500.0000 [IU] | Freq: Once | INTRAVENOUS | Status: AC | PRN
  Administered 2015-04-18: 500 [IU]
  Filled 2015-04-18: qty 5

## 2015-04-18 MED ORDER — CARBOPLATIN CHEMO INJECTION 600 MG/60ML
445.0000 mg | Freq: Once | INTRAVENOUS | Status: AC
  Administered 2015-04-18: 450 mg via INTRAVENOUS
  Filled 2015-04-18: qty 45

## 2015-04-18 MED ORDER — DIPHENHYDRAMINE HCL 50 MG/ML IJ SOLN
12.5000 mg | Freq: Once | INTRAMUSCULAR | Status: AC
  Administered 2015-04-18: 12.5 mg via INTRAVENOUS
  Filled 2015-04-18: qty 1

## 2015-04-18 MED ORDER — PACLITAXEL CHEMO INJECTION 300 MG/50ML
150.0000 mg/m2 | Freq: Once | INTRAVENOUS | Status: AC
  Administered 2015-04-18: 282 mg via INTRAVENOUS
  Filled 2015-04-18: qty 47

## 2015-04-18 MED ORDER — LEVOFLOXACIN 500 MG PO TABS: 500.0000 mg | ORAL_TABLET | Freq: Every day | ORAL | Status: DC

## 2015-04-18 MED ORDER — SODIUM CHLORIDE 0.9 % IV SOLN
Freq: Once | INTRAVENOUS | Status: AC
  Administered 2015-04-18: 11:00:00 via INTRAVENOUS
  Filled 2015-04-18: qty 5

## 2015-04-18 MED ORDER — SODIUM CHLORIDE 0.9 % IV SOLN
Freq: Once | INTRAVENOUS | Status: AC
  Administered 2015-04-18: 11:00:00 via INTRAVENOUS
  Filled 2015-04-18: qty 1000

## 2015-04-18 NOTE — Progress Notes (Signed)
Patient does have living will.  Patient never smoked.  Patient has had sore throat on right side.

## 2015-04-24 ENCOUNTER — Encounter: Payer: Self-pay | Admitting: Oncology

## 2015-04-24 NOTE — Progress Notes (Signed)
[place] @ [facility] Telephone:(336) [phone]  Fax:([phone]) [place]: [birth year]  MR#: [id number]  ZJI#:[id number]  Patient Care Team: Jerrol Banana., MD as PCP - General (Family Medicine) [facility] Gaetana Michaelis, MD as Referring Physician (Obstetrics and Gynecology) Robert Bellow, MD as Consulting Physician (General Surgery)  CHIEF COMPLAINT:  Chief Complaint  Patient presents with  . 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 2.   carcinoma  of breast ( left) status post lumpectomy ,adiation  therapyand 5 years of tamoxifen. 3.history of atrial fibrillation being managed by cardiologist 4.  Status post bilateral salpingo-oophorectomy on fourth of May, 2016.  with optimal debulking surgery 5.  Starting chemotherapy with carboplatinum and Taxol in dose dense fashion from Jan 02, 2015 5.  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     Ovarian cancer   12/16/2014 Initial Diagnosis Ovarian cancer    Oncology Flowsheet 02/07/2015 02/14/2015 02/28/2015 03/07/2015 03/28/2015 04/11/2015 04/18/2015  Day, Cycle Day 1, Cycle 2 2, Cycle 2 Day 1, Cycle 3 Day 8, Cycle 3 Day 1, Cycle 4; Day 1, Cycle 1 - Day 1, Cycle 2  CARBOplatin (PARAPLATIN) IV 450 mg - 450 mg - 450 mg - 450 mg  dexamethasone (DECADRON) IJ - - - - - - -  dexamethasone (DECADRON) IV [ 12 mg ] [ 20 mg ] [ 12 mg ] [ 20 mg ] [ 12 mg ] - [ 12 mg ]  enoxaparin (LOVENOX) [place] - - - - - 40 mg -  fosaprepitant (EMEND) IV [ 150 mg ] - [ 150 mg ] - [ 150 mg ] - [ 150 mg ]  LORazepam (ATIVAN) PO - - - - - 0.25 mg -  ondansetron (ZOFRAN) IV - [ 8 mg ] - [ 8 mg ] - - -  PACLitaxel (TAXOL) IV 80 mg/m2 80 mg/m2 90 mg/m2 90 mg/m2 175 mg/m2 - 150 mg/m2  palonosetron (ALOXI) IV 0.25 mg - 0.25 mg - 0.25 mg - 0.25 mg  pegfilgrastim (NEULASTA ONPRO KIT) [place] - - -  - 6 mg - 6 mg  scopolamine 1 MG/3DAYS (TRANSDERM-SCOP) TD - - - - - - -    INTERVAL HISTORY: 74 year old lady with a history of ovarian cancer here to initiate next cycle of chemotherapy.  This is the third cycle of treatment.  Patient is here for ongoing evaluation.  Patient is missing consistently day 15 Taxol so treatment is going to be switched over to standard carboplatinum and Taxol therapy with Taxol 175 mg/m on day 1 and chemotherapy repeated every [redacted] weeks along with Neulasta.  Patient is quite concerned about Neulasta because he is watched all the advertisements about side effect. April 18, 2015 Patient is here for the fifth cycle of chemotherapy.  Patient last week had a high fever was admitted in the hospital overnight.  Gradually recovered.  Appetite has been fairly good.  Feeling somewhat weak and tired.  Tinting.  No numbness. REVIEW OF SYSTEMS:    general status: Patient is feeling weak and tired.  No change in a performance status.  No chills.  No fever. HEENT: Alopecia.  No evidence of stomatitis Lungs: No cough or shortness of breath Cardiac: No chest pain or paroxysmal nocturnal dyspnea GI: No nausea no vomiting no diarrhea no abdominal pain  Skin: No rash GU: No dysuria or hematuria Lower extremity no swelling Neurological system: No tingling.  No numbness.  No other focal signs Musculoskeletal system no bony pains  As per HPI. Otherwise, a complete review of systems is negatve.  PAST MEDICAL HISTORY: Past Medical History  Diagnosis Date  . Atrial fibrillation   . Hyperlipidemia   . PVC (premature ventricular contraction)   . Mitral insufficiency   . GERD (gastroesophageal reflux disease)   . Cancer of breast     Left  . Hypothyroidism   . Ovarian cancer     s/p BSO optimal tumor debulking May 2016    PAST SURGICAL HISTORY: Past Surgical History  Procedure Laterality Date  . Cesarean section      x2  . Abdominal hysterectomy    . Cholecystectomy    .  Colonoscopy  2006  . Breast surgery      Left  . Laparotomy with staging N/A 12/22/2014    Procedure: LAPAROTOMY WITH STAGING;  Surgeon: Gillis Ends, MD;  Location: [facility] ORS;  Service: Gynecology;  Laterality: N/A;  . Salpingoophorectomy Bilateral 12/22/2014    Procedure: SALPINGO OOPHORECTOMY;  Surgeon: Gillis Ends, MD;  Location: [facility] ORS;  Service: Gynecology;  Laterality: Bilateral;  . Debulking N/A 12/22/2014    Procedure: DEBULKING;  Surgeon: Gillis Ends, MD;  Location: [facility] ORS;  Service: Gynecology;  Laterality: N/A;  . Laparotomy N/A 12/22/2014    Procedure: EXPLORATORY LAPAROTOMY;  Surgeon: Robert Bellow, MD;  Location: [facility] ORS;  Service: General;  Laterality: N/A;  . Portacath placement Right 12/22/2014    Procedure: INSERTION PORT-A-CATH;  Surgeon: Robert Bellow, MD;  Location: [facility] ORS;  Service: General;  Laterality: Right;  . Peripheral vascular catheterization Left 12/22/2014    Procedure: PORTA CATH INSERTION;  Surgeon: Robert Bellow, MD;  Location: [facility] ORS;  Service: General;  Laterality: Left;  . Bilateral salpingoophorectomy  May 2016    with optimal tumor debulking     FAMILY HISTORY Family History  Problem Relation Age of Onset  . Cancer Mother   . Heart disease Father   . Pulmonary embolism Sister   . Pulmonary embolism Other   . Cancer Other     ADVANCED DIRECTIVES:  Patient does have advance healthcare directive, Patient   does not desire to make any changes  HEALTH MAINTENANCE: Social History  Substance Use Topics  . Smoking status: Never Smoker   . Smokeless tobacco: Never Used  . Alcohol Use: No      Allergies  Allergen Reactions  . Carafate [Sucralfate] Rash  . Sulfa Antibiotics Rash    Current Outpatient Prescriptions  Medication Sig Dispense Refill  . acetaminophen (TYLENOL) 500 MG chewable tablet Chew 500 mg by mouth every 6 (six) hours as needed for pain.    Marland Kitchen amoxicillin-clavulanate (AUGMENTIN)  875-125 MG per tablet Take 1 tablet by mouth 2 (two) times daily. 16 tablet 0  . docusate sodium (COLACE) 50 MG capsule Take 50 mg by mouth 2 (two) times daily.    Marland Kitchen levofloxacin (LEVAQUIN) 500 MG tablet Take 1 tablet (500 mg total) by mouth daily. 7 tablet 0  . levothyroxine (SYNTHROID, LEVOTHROID) 50 MCG tablet Take 50 mcg by mouth daily before breakfast.    . lidocaine-prilocaine (EMLA) cream Apply 1 application topically as needed. 30 g 0  . LORazepam (ATIVAN) 0.5 MG tablet Take 1 tablet (0.5 mg total) by mouth at bedtime. 30 tablet 3  . ondansetron (ZOFRAN) 4  MG tablet Take 1 tablet (4 mg total) by mouth every 6 (six) hours as needed for nausea or vomiting. 30 tablet 3  . pantoprazole (PROTONIX) 40 MG tablet Take 40 mg by mouth daily.    . polyethylene glycol (MIRALAX / GLYCOLAX) packet Take 17 g by mouth daily.    . propafenone (RYTHMOL) 225 MG tablet Take 225 mg by mouth 2 (two) times daily.     No current facility-administered medications for this visit.   Facility-Administered Medications Ordered in Other Visits  Medication Dose Route Frequency Provider Last Rate Last Dose  . sodium chloride 0.9 % injection 10 mL  10 mL Intracatheter PRN Forest Gleason, MD   10 mL at 02/07/15 0930    OBJECTIVE:  Filed Vitals:   04/18/15 0912  BP: 135/82  Pulse: 77  Temp: 96.3 F (35.7 C)     Body mass index is 29.55 kg/(m^2).    ECOG FS:1 - Symptomatic but completely ambulatory  PHYSICAL EXAM: GENERAL:  Well developed, well nourished, sitting comfortably in the exam room in no acute distress. MENTAL STATUS:  Alert and oriented to person, place and time. HEAD:   ALOPECIA  Normocephalic, atraumatic, face symmetric, no Cushingoid features. EYES:  Pupils equal round and reactive to light and accomodation.  No conjunctivitis or scleral icterus. ENT:  Oropharynx clear without lesion.  Tongue normal. Mucous membranes moist.  RESPIRATORY:  Clear to auscultation without rales, wheezes or  rhonchi. CARDIOVASCULAR:  Regular rate and rhythm without murmur, rub or gallop. BREAST:  Right breast without masses, skin changes or nipple discharge.  Left breast without masses, skin changes or nipple discharge. ABDOMEN:  Soft, non-tender, with active bowel sounds, and no hepatosplenomegaly.  No masses. BACK:  No CVA tenderness.  No tenderness on percussion of the back or rib cage. SKIN:  No rashes, ulcers or lesions. EXTREMITIES: No edema, no skin discoloration or tenderness.  No palpable cords. LYMPH NODES: No palpable cervical, supraclavicular, axillary or inguinal adenopathy  NEUROLOGICAL: Unremarkable. PSYCH:  Appropriate.   LAB RESULTS:  Infusion on 04/18/2015  Component Date Value Ref Range Status  . WBC 04/18/2015 5.3  3.6 - 11.0 K/uL Final   A-LINE DRAW  . RBC 04/18/2015 3.49* 3.80 - 5.20 MIL/uL Final  . Hemoglobin 04/18/2015 11.2* 12.0 - 16.0 g/dL Final  . HCT 04/18/2015 32.8* 35.0 - 47.0 % Final  . MCV 04/18/2015 94.1  80.0 - 100.0 fL Final  . MCH 04/18/2015 32.0  26.0 - 34.0 pg Final  . MCHC 04/18/2015 34.0  32.0 - 36.0 g/dL Final  . RDW 04/18/2015 18.0* 11.5 - 14.5 % Final  . Platelets 04/18/2015 401  150 - 440 K/uL Final  . Neutrophils Relative % 04/18/2015 40   Final  . Neutro Abs 04/18/2015 2.1  1.4 - 6.5 K/uL Final  . Lymphocytes Relative 04/18/2015 43   Final  . Lymphs Abs 04/18/2015 2.3  1.0 - 3.6 K/uL Final  . Monocytes Relative 04/18/2015 15   Final  . Monocytes Absolute 04/18/2015 0.8  0.2 - 0.9 K/uL Final  . Eosinophils Relative 04/18/2015 1   Final  . Eosinophils Absolute 04/18/2015 0.0  0 - 0.7 K/uL Final  . Basophils Relative 04/18/2015 1   Final  . Basophils Absolute 04/18/2015 0.0  0 - 0.1 K/uL Final  . Sodium 04/18/2015 137  135 - 145 mmol/L Final  . Potassium 04/18/2015 3.6  3.5 - 5.1 mmol/L Final  . Chloride 04/18/2015 107  101 - 111 mmol/L Final  .  CO2 04/18/2015 26  22 - 32 mmol/L Final  . Glucose, Bld 04/18/2015 90  65 - 99 mg/dL Final  .  BUN 04/18/2015 13  6 - 20 mg/dL Final  . Creatinine, Ser 04/18/2015 0.88  0.44 - 1.00 mg/dL Final  . Calcium 04/18/2015 8.8* 8.9 - 10.3 mg/dL Final  . Total Protein 04/18/2015 6.8  6.5 - 8.1 g/dL Final  . Albumin 04/18/2015 3.2* 3.5 - 5.0 g/dL Final  . AST 04/18/2015 15  15 - 41 U/L Final  . ALT 04/18/2015 13* 14 - 54 U/L Final  . Alkaline Phosphatase 04/18/2015 87  38 - 126 U/L Final  . Total Bilirubin 04/18/2015 0.3  0.3 - 1.2 mg/dL Final  . GFR calc non Af Amer 04/18/2015 >60  >60 mL/min Final  . GFR calc Af Amer 04/18/2015 >60  >60 mL/min Final   Comment: (NOTE) The eGFR has been calculated using the CKD EPI equation. This calculation has not been validated in all clinical situations. eGFR's persistently <60 mL/min signify possible Chronic Kidney Disease.   . Anion gap 04/18/2015 4* 5 - 15 Final  . Magnesium 04/18/2015 2.0  1.7 - 2.4 mg/dL Final      ASSESSMENT: OVARIAN  cancer stage III Continue chemotherapy with carboplatinum and Taxol.   MEDICAL DECISION MAKING:  All lab data has been reviewed.  Dose of Taxol was reduced because of myelosuppression and fever in spite of Neulasta. Patient will be reevaluated in 3 weeks for next cycle of chemotherapy    Staging form: Ovary, AJCC 7th Edition     Clinical: Stage IIIC (T3c, N0, M0) - Marni Griffon, MD   04/24/2015 8:46 AM

## 2015-04-26 ENCOUNTER — Inpatient Hospital Stay: Payer: PPO | Attending: Oncology

## 2015-04-26 DIAGNOSIS — E785 Hyperlipidemia, unspecified: Secondary | ICD-10-CM | POA: Diagnosis not present

## 2015-04-26 DIAGNOSIS — R531 Weakness: Secondary | ICD-10-CM | POA: Insufficient documentation

## 2015-04-26 DIAGNOSIS — Z9223 Personal history of estrogen therapy: Secondary | ICD-10-CM | POA: Diagnosis not present

## 2015-04-26 DIAGNOSIS — I480 Paroxysmal atrial fibrillation: Secondary | ICD-10-CM | POA: Diagnosis not present

## 2015-04-26 DIAGNOSIS — Z5111 Encounter for antineoplastic chemotherapy: Secondary | ICD-10-CM | POA: Diagnosis not present

## 2015-04-26 DIAGNOSIS — K219 Gastro-esophageal reflux disease without esophagitis: Secondary | ICD-10-CM | POA: Insufficient documentation

## 2015-04-26 DIAGNOSIS — Z90722 Acquired absence of ovaries, bilateral: Secondary | ICD-10-CM | POA: Diagnosis not present

## 2015-04-26 DIAGNOSIS — C569 Malignant neoplasm of unspecified ovary: Secondary | ICD-10-CM

## 2015-04-26 DIAGNOSIS — I10 Essential (primary) hypertension: Secondary | ICD-10-CM | POA: Diagnosis not present

## 2015-04-26 DIAGNOSIS — C561 Malignant neoplasm of right ovary: Secondary | ICD-10-CM | POA: Diagnosis not present

## 2015-04-26 DIAGNOSIS — Z23 Encounter for immunization: Secondary | ICD-10-CM | POA: Diagnosis not present

## 2015-04-26 DIAGNOSIS — Z853 Personal history of malignant neoplasm of breast: Secondary | ICD-10-CM | POA: Diagnosis not present

## 2015-04-26 DIAGNOSIS — E039 Hypothyroidism, unspecified: Secondary | ICD-10-CM | POA: Insufficient documentation

## 2015-04-26 DIAGNOSIS — Z923 Personal history of irradiation: Secondary | ICD-10-CM | POA: Insufficient documentation

## 2015-04-26 DIAGNOSIS — R5383 Other fatigue: Secondary | ICD-10-CM | POA: Diagnosis not present

## 2015-04-26 DIAGNOSIS — Z883 Allergy status to other anti-infective agents status: Secondary | ICD-10-CM | POA: Diagnosis not present

## 2015-04-26 LAB — CBC WITH DIFFERENTIAL/PLATELET
BASOS ABS: 0.1 10*3/uL (ref 0–0.1)
BASOS PCT: 1 %
Eosinophils Absolute: 0.1 10*3/uL (ref 0–0.7)
Eosinophils Relative: 0 %
HEMATOCRIT: 35.3 % (ref 35.0–47.0)
HEMOGLOBIN: 11.6 g/dL — AB (ref 12.0–16.0)
LYMPHS PCT: 20 %
Lymphs Abs: 3.1 10*3/uL (ref 1.0–3.6)
MCH: 31.4 pg (ref 26.0–34.0)
MCHC: 32.9 g/dL (ref 32.0–36.0)
MCV: 95.2 fL (ref 80.0–100.0)
MONOS PCT: 9 %
Monocytes Absolute: 1.4 10*3/uL — ABNORMAL HIGH (ref 0.2–0.9)
NEUTROS ABS: 11 10*3/uL — AB (ref 1.4–6.5)
NEUTROS PCT: 70 %
Platelets: 243 10*3/uL (ref 150–440)
RBC: 3.71 MIL/uL — ABNORMAL LOW (ref 3.80–5.20)
RDW: 17.9 % — ABNORMAL HIGH (ref 11.5–14.5)
WBC: 15.7 10*3/uL — ABNORMAL HIGH (ref 3.6–11.0)

## 2015-05-02 ENCOUNTER — Inpatient Hospital Stay: Payer: PPO

## 2015-05-02 DIAGNOSIS — Z5111 Encounter for antineoplastic chemotherapy: Secondary | ICD-10-CM | POA: Diagnosis not present

## 2015-05-02 DIAGNOSIS — C569 Malignant neoplasm of unspecified ovary: Secondary | ICD-10-CM

## 2015-05-02 DIAGNOSIS — C562 Malignant neoplasm of left ovary: Secondary | ICD-10-CM

## 2015-05-02 LAB — CBC WITH DIFFERENTIAL/PLATELET
Basophils Absolute: 0 10*3/uL (ref 0–0.1)
Basophils Relative: 1 %
EOS ABS: 0.1 10*3/uL (ref 0–0.7)
EOS PCT: 1 %
HCT: 36.4 % (ref 35.0–47.0)
HEMOGLOBIN: 12.1 g/dL (ref 12.0–16.0)
LYMPHS ABS: 2.3 10*3/uL (ref 1.0–3.6)
Lymphocytes Relative: 43 %
MCH: 32.2 pg (ref 26.0–34.0)
MCHC: 33.3 g/dL (ref 32.0–36.0)
MCV: 96.5 fL (ref 80.0–100.0)
MONOS PCT: 10 %
Monocytes Absolute: 0.5 10*3/uL (ref 0.2–0.9)
Neutro Abs: 2.5 10*3/uL (ref 1.4–6.5)
Neutrophils Relative %: 45 %
PLATELETS: 188 10*3/uL (ref 150–440)
RBC: 3.77 MIL/uL — ABNORMAL LOW (ref 3.80–5.20)
RDW: 19.2 % — ABNORMAL HIGH (ref 11.5–14.5)
WBC: 5.4 10*3/uL (ref 3.6–11.0)

## 2015-05-02 LAB — COMPREHENSIVE METABOLIC PANEL
ALK PHOS: 100 U/L (ref 38–126)
ALT: 13 U/L — ABNORMAL LOW (ref 14–54)
ANION GAP: 5 (ref 5–15)
AST: 16 U/L (ref 15–41)
Albumin: 3.4 g/dL — ABNORMAL LOW (ref 3.5–5.0)
BUN: 16 mg/dL (ref 6–20)
CALCIUM: 8.7 mg/dL — AB (ref 8.9–10.3)
CO2: 25 mmol/L (ref 22–32)
Chloride: 107 mmol/L (ref 101–111)
Creatinine, Ser: 0.83 mg/dL (ref 0.44–1.00)
Glucose, Bld: 88 mg/dL (ref 65–99)
Potassium: 3.7 mmol/L (ref 3.5–5.1)
SODIUM: 137 mmol/L (ref 135–145)
Total Bilirubin: 0.4 mg/dL (ref 0.3–1.2)
Total Protein: 6.6 g/dL (ref 6.5–8.1)

## 2015-05-02 LAB — MAGNESIUM: MAGNESIUM: 1.8 mg/dL (ref 1.7–2.4)

## 2015-05-04 ENCOUNTER — Inpatient Hospital Stay (HOSPITAL_BASED_OUTPATIENT_CLINIC_OR_DEPARTMENT_OTHER): Payer: PPO | Admitting: Obstetrics and Gynecology

## 2015-05-04 VITALS — BP 134/76 | HR 69 | Temp 96.3°F | Resp 18 | Ht 64.0 in | Wt 175.3 lb

## 2015-05-04 DIAGNOSIS — C569 Malignant neoplasm of unspecified ovary: Secondary | ICD-10-CM

## 2015-05-04 DIAGNOSIS — Z5111 Encounter for antineoplastic chemotherapy: Secondary | ICD-10-CM | POA: Diagnosis not present

## 2015-05-04 DIAGNOSIS — Z809 Family history of malignant neoplasm, unspecified: Secondary | ICD-10-CM | POA: Diagnosis not present

## 2015-05-04 NOTE — Patient Instructions (Signed)
Ovarian Cancer The ovaries are the parts of the female reproductive system that produce eggs. Women have two ovaries. They are located on either side of the uterus. Ovarian cancer is an abnormal growth of tissue (tumor) in one or both ovaries that is cancerous (malignant). Unlike noncancerous (benign) tumors, malignant tumors can spread to other parts of your body.  CAUSES  The exact cause of ovarian cancer is unknown. RISK FACTORS There are a number of risk factors that can increase your chances of getting ovarian cancer. They include:  Age. Ovarian cancer is most common in women aged 43-75 years.  Being Caucasian.  Personal or family history of endometrial, colon, breast, or ovarian cancer.  Having the BRCA 1 and BRCA 2 genes.  Using fertility medicines.  Starting menstruation before the age of 62 years.  Starting menopause after the age of 76 years.  Becoming pregnant for the first time at the age of 64 years or older.  Never being pregnant.  Having hormone replacement therapy.  Eating high amounts of animal fat. SYMPTOMS  Early ovarian cancer often does not cause symptoms. As the cancer grows, symptoms may include:  Unexplained weight loss.  Abdominal pain, swelling, or bloating.  Pain and pressure in the back and pelvis.  Abnormal vaginal bleeding.  Loss of appetite.  Frequent urination.  Pain during intercourse.  Fatigue. DIAGNOSIS  Your caregiver will ask about your medical history. He or she may also perform a number of procedures, such as:  A pelvic exam. Your caregiver will feel the organs in the pelvis for any lumps or changes in their shape or size.  Imaging tests, such as computed tomography (CT) scans, ultrasound tests, or magnetic resonance imaging (MRI).  Blood tests. Your cancer will be staged to determine its severity and extent. Staging is a careful attempt to find out the size of the tumor, whether the cancer has spread, and if so, to what  parts of the body. You may need to have more tests to determine the stage of your cancer. The test results will help determine what treatment plan is best for you. STAGES   Stage I. The cancer is found in only one or both ovaries.  Stage II. The cancer has spread to other parts of the pelvis, such as the uterus or fallopian tubes.  Stage III. The cancer has spread outside the pelvis to the abdominal cavity or to the lymph nodes in the abdomen.  Stage IV. The cancer has spread outside the abdomen to areas such as the liver or lungs. TREATMENT  Most women with ovarian cancer are treated with a combination of surgery and chemotherapy.   If the cancer is found at an early stage, surgery may be done to remove one ovary and its fallopian tube.  For more advanced cases, surgery may be done to remove the uterus, cervix, fallopian tubes, and ovaries. Your caregiver may also remove the lymph nodes near the tumor and some tissue in the abdomen.  Chemotherapy is the use of drugs to kill cancer cells. Chemotherapy may be used before or after the surgery. HOME CARE INSTRUCTIONS   Only take over-the-counter or prescription medicines as directed by your caregiver.  Maintain a healthy diet.  Consider joining a support group. If you are feeling stressed because you have ovarian cancer, a support group may help you learn to cope with the disease.  Seek advice to help you manage the treatment of side effects.  Keep all follow-up appointments as directed  by your caregiver. SEEK IMMEDIATE MEDICAL CARE IF:   You have new or worsening symptoms.  You have a fever during your chemotherapy treatment. Document Released: 06/26/2004 Document Revised: 07/23/2012 Document Reviewed: 10/02/2013 [facility] Patient Information 2015 [place], [place]. This information is not intended to replace advice given to you by your health care provider. Make sure you discuss any questions you have with your health care  provider.

## 2015-05-04 NOTE — Progress Notes (Addendum)
Gynecologic Oncology Interval Visit   Referring Provider: Blain Pais, MD.  Chief Concern: Continued surveillance for stage IIIc ovarian cancer  Subjective:  Jamie Conner is a 74 y.o. female who is seen for continued surveillance for stage IIIc ovarian cancer.   She has undergone 5 cycles of chemotherapy and presents today for a pelvic exam. Overall she is tolerating chemotherapy quite well. She does have peripheral neuropathy symptoms.  Lab Results  Component Value Date   CA125 9.8 03/14/2015     Gynecologic Oncology History Jamie Conner is a pleasant female who is seen for postoperative visit for stage IIIc ovarian cancer. See prior notes for complete detail.  She also has a history of  carcinoma of breast ( left) status post lumpectomy , radiation therapy and 5 years of tamoxifen. She underwent exploratory Laparotomy, bilateral salpingo-oophorectomy, right ureterolysis, infragastric omentectomy, and optimally debulked to no gross residual  May 4th, 2016. Preop CA125 2354.0.  She started chemotherapy with carboplatinum and Taxol in dose dense fashion from Jan 02, 2015   Genetic testing: ATM mutation [id number]  Problem List: Patient Active Problem List   Diagnosis Date Noted  . Hypothyroid 04/11/2015  . Benign essential HTN 02/01/2015  . Retroperitoneal fibrosis   . Combined fat and carbohydrate induced hyperlipemia 01/04/2015  . Awareness of heartbeats 01/04/2015  . Beat, premature ventricular 01/04/2015  . Ovarian cancer 12/16/2014  . MI (mitral incompetence) 09/02/2014  . AF (paroxysmal atrial fibrillation) 09/02/2014    Past Medical History: Past Medical History  Diagnosis Date  . Atrial fibrillation   . Hyperlipidemia   . PVC (premature ventricular contraction)   . Mitral insufficiency   . GERD (gastroesophageal reflux disease)   . Cancer of breast     Left  . Hypothyroidism   . Ovarian cancer     s/p BSO optimal tumor debulking May 2016     Past Surgical History: Past Surgical History  Procedure Laterality Date  . Cesarean section      x2  . Abdominal hysterectomy    . Cholecystectomy    . Colonoscopy  2006  . Breast surgery      Left  . Laparotomy with staging N/A 12/22/2014    Procedure: LAPAROTOMY WITH STAGING;  Surgeon: Gillis Ends, MD;  Location: [facility] ORS;  Service: Gynecology;  Laterality: N/A;  . Salpingoophorectomy Bilateral 12/22/2014    Procedure: SALPINGO OOPHORECTOMY;  Surgeon: Gillis Ends, MD;  Location: [facility];  Service: Gynecology;  Laterality: Bilateral;  . Debulking N/A 12/22/2014    Procedure: DEBULKING;  Surgeon: Gillis Ends, MD;  Location: [facility] OR[facility];  Service: Gynecology;  Laterality: N/A;  . Laparotomy N/A 12/22/2014    Procedure: EXPLORATORY LAPAROTOMY;  Surgeon: Robert Bellow, MD;  Location: [facility] ORS;  Service: General;  Laterality: N/A;  . Portacath placement Right 12/22/2014    Procedure: INSERTION PORT-A-CATH;  Surgeon: Robert Bellow, MD;  Location: [facility] ORS;  Service: General;  Laterality: Right;  . Peripheral vascular catheterization Left 12/22/2014    Procedure: PORTA CATH INSERTION;  Surgeon: Robert Bellow, MD;  Location: [facility] ORS;  Service: General;  Laterality: Left;  . Bilateral salpingoophorectomy  May 2016    with optimal tumor debulking     Past Gynecologic History:  See HPI  OB History:  OB History  Gravida Para Term Preterm AB SAB TAB Ectopic Multiple Living  2         2    # Outcome Date GA Lbr Len/2nd  Weight Sex Delivery Anes PTL Lv  2 Gravida           1 Gravida             Obstetric Comments  1st Menstrual Cycle:  14  1st Pregnancy:  68    Family History: Family History  Problem Relation Age of Onset  . Cancer Mother     breast, throat, and stomach  . Heart disease Father   . Pulmonary embolism Sister   . Pulmonary embolism Other   . Cancer Other   . Cancer Brother 60    angiosarcoma of the chest; carcinoid small  intestinal tumor  . Cancer Maternal Aunt     breast cancer  . Cancer Maternal Grandfather 64    pancreatic    Social History: Social History   Social History  . Marital Status: Married    Spouse Name: N/A  . Number of Children: N/A  . Years of Education: N/A   Occupational History  . Not on file.   Social History Main Topics  . Smoking status: Never Smoker   . Smokeless tobacco: Never Used  . Alcohol Use: No  . Drug Use: No  . Sexual Activity: Not Currently   Other Topics Concern  . Not on file   Social History Narrative    Allergies: Allergies  Allergen Reactions  . Carafate [Sucralfate] Rash  . Sulfa Antibiotics Rash    Current Medications: Current Outpatient Prescriptions  Medication Sig Dispense Refill  . acetaminophen (TYLENOL) 500 MG chewable tablet Chew 500 mg by mouth every 6 (six) hours as needed for pain.    Marland Kitchen docusate sodium (COLACE) 50 MG capsule Take 50 mg by mouth daily as needed.     Marland Kitchen levothyroxine (SYNTHROID, LEVOTHROID) 50 MCG tablet Take 50 mcg by mouth daily before breakfast.    . lidocaine-prilocaine (EMLA) cream Apply 1 application topically as needed. 30 g 0  . LORazepam (ATIVAN) 0.5 MG tablet Take 1 tablet (0.5 mg total) by mouth at bedtime. 30 tablet 3  . ondansetron (ZOFRAN) 4 MG tablet Take 1 tablet (4 mg total) by mouth every 6 (six) hours as needed for nausea or vomiting. 30 tablet 3  . pantoprazole (PROTONIX) 40 MG tablet Take 40 mg by mouth daily.    . polyethylene glycol (MIRALAX / GLYCOLAX) packet Take 17 g by mouth daily.    . propafenone (RYTHMOL) 225 MG tablet Take 225 mg by mouth 2 (two) times daily.    Marland Kitchen levofloxacin (LEVAQUIN) 500 MG tablet Take 1 tablet (500 mg total) by mouth daily. (Patient not taking: Reported on 05/04/2015) 7 tablet 0   No current facility-administered medications for this visit.   Facility-Administered Medications Ordered in Other Visits  Medication Dose Route Frequency Provider Last Rate Last Dose   . sodium chloride 0.9 % injection 10 mL  10 mL Intracatheter PRN Forest Gleason, MD   10 mL at 02/07/15 0930    Review of Systems General: fatigue  HEENT: no complaints  Lungs: no complaints  Cardiac: no complaints  GI: constipation  GU: no complaints  Musculoskeletal: no complaints  Extremities: numbness/tingling in right foot  Skin: no complaints  Neuro: no complaints  Endocrine: no complaints  Psych: no complaints       Objective:  Physical Examination:  BP 134/76 mmHg  Pulse 69  Temp(Src) 96.3 F (35.7 C) (Tympanic)  Resp 18  Ht $R'5\' 4"'iM$  (1.626 m)  Wt 175 lb 4.3 oz (79.5 kg)  BMI  30.07 kg/m2   ECOG Performance Status: 0 - Asymptomatic  General appearance: alert, cooperative and appears stated age 29: ATNC Lymph node survey: non-palpable inguinal Abdomen: SNDNT. Incision all well healed. No hernias, no masses, no ascites.  Extremities:No edema Skin exam - Well healed incisions.  Neurological exam reveals alert, oriented, normal speech, no focal findings or movement disorder noted  Pelvic: Vulva: normal appearing vulva with no masses, tenderness or lesions; Vagina: normal; Adnexa: negative for masses or nodularity; Uterus and Cervix: surgically absent; Rectal: confirms    Lab Review Labs on site today: N/a  Radiologic Imaging: none    Assessment:  Jamie Conner is a 74 y.o. female diagnosed with optimally debulked stage IIIc ovarian cancer currently on chemotherapy. ATM mutation. Extensive family history of cancer (breast, pancreatic, throat, gastric, adenosarcoma of the chest, carcinoid tumor of the small bowel) in several first degree relatives.   Plan:   Problem List Items Addressed This Visit      Genitourinary   Ovarian cancer - Primary (Chronic)   Relevant Orders   CA 125    Other Visit Diagnoses    Family history of cancer           Continue chemotherapy under the direction of Dr. Oliva Bustard. We discussed the possibility of  maintenance/consolidation therapy which he will review with them further.   At her request I did discuss symptoms to monitor for that could indicate recurrence in the surveillance setting.  I will follow up Dr. Coralee Rud regarding ATM mutation recommendations regarding screening and cascade testing. She has an extensive family history with multiple first degree relatives affected.    Gillis Ends, MD  ADDENDUM: I spoke with Dr. Lisbeth Ply at [facility] and he recommended genetic counseling for the patient and possibly other family members. He provided a phone number for them to call to schedule that appointment. The number is [phone]. Gillis Ends, MD  CC:  Blain Pais, MD.

## 2015-05-04 NOTE — Progress Notes (Signed)
Assisted MD with pelvic exam

## 2015-05-05 ENCOUNTER — Telehealth: Payer: Self-pay | Admitting: *Deleted

## 2015-05-05 NOTE — Telephone Encounter (Signed)
Telephone encounter.

## 2015-05-06 ENCOUNTER — Other Ambulatory Visit: Payer: Self-pay | Admitting: *Deleted

## 2015-05-06 DIAGNOSIS — C569 Malignant neoplasm of unspecified ovary: Secondary | ICD-10-CM

## 2015-05-09 ENCOUNTER — Encounter: Payer: Self-pay | Admitting: Oncology

## 2015-05-09 ENCOUNTER — Inpatient Hospital Stay (HOSPITAL_BASED_OUTPATIENT_CLINIC_OR_DEPARTMENT_OTHER): Payer: PPO | Admitting: Oncology

## 2015-05-09 ENCOUNTER — Inpatient Hospital Stay: Payer: PPO

## 2015-05-09 ENCOUNTER — Inpatient Hospital Stay (HOSPITAL_BASED_OUTPATIENT_CLINIC_OR_DEPARTMENT_OTHER): Payer: PPO

## 2015-05-09 VITALS — BP 123/73 | HR 80 | Temp 97.6°F | Resp 20

## 2015-05-09 VITALS — BP 129/73 | HR 76 | Temp 96.8°F | Wt 174.2 lb

## 2015-05-09 DIAGNOSIS — Z5111 Encounter for antineoplastic chemotherapy: Secondary | ICD-10-CM | POA: Diagnosis not present

## 2015-05-09 DIAGNOSIS — Z90722 Acquired absence of ovaries, bilateral: Secondary | ICD-10-CM | POA: Diagnosis not present

## 2015-05-09 DIAGNOSIS — Z923 Personal history of irradiation: Secondary | ICD-10-CM

## 2015-05-09 DIAGNOSIS — C569 Malignant neoplasm of unspecified ovary: Secondary | ICD-10-CM

## 2015-05-09 DIAGNOSIS — E785 Hyperlipidemia, unspecified: Secondary | ICD-10-CM

## 2015-05-09 DIAGNOSIS — C561 Malignant neoplasm of right ovary: Secondary | ICD-10-CM | POA: Diagnosis not present

## 2015-05-09 DIAGNOSIS — E039 Hypothyroidism, unspecified: Secondary | ICD-10-CM

## 2015-05-09 DIAGNOSIS — Z853 Personal history of malignant neoplasm of breast: Secondary | ICD-10-CM

## 2015-05-09 DIAGNOSIS — R531 Weakness: Secondary | ICD-10-CM

## 2015-05-09 DIAGNOSIS — I480 Paroxysmal atrial fibrillation: Secondary | ICD-10-CM

## 2015-05-09 DIAGNOSIS — Z883 Allergy status to other anti-infective agents status: Secondary | ICD-10-CM

## 2015-05-09 DIAGNOSIS — R5383 Other fatigue: Secondary | ICD-10-CM

## 2015-05-09 DIAGNOSIS — I10 Essential (primary) hypertension: Secondary | ICD-10-CM

## 2015-05-09 DIAGNOSIS — Z9223 Personal history of estrogen therapy: Secondary | ICD-10-CM

## 2015-05-09 LAB — CBC WITH DIFFERENTIAL/PLATELET
BASOS ABS: 0 10*3/uL (ref 0–0.1)
Basophils Relative: 1 %
EOS PCT: 1 %
Eosinophils Absolute: 0 10*3/uL (ref 0–0.7)
HEMATOCRIT: 35.7 % (ref 35.0–47.0)
HEMOGLOBIN: 12.1 g/dL (ref 12.0–16.0)
LYMPHS ABS: 2.1 10*3/uL (ref 1.0–3.6)
LYMPHS PCT: 34 %
MCH: 32.8 pg (ref 26.0–34.0)
MCHC: 33.8 g/dL (ref 32.0–36.0)
MCV: 97 fL (ref 80.0–100.0)
Monocytes Absolute: 0.8 10*3/uL (ref 0.2–0.9)
Monocytes Relative: 13 %
NEUTROS ABS: 3.2 10*3/uL (ref 1.4–6.5)
Neutrophils Relative %: 51 %
Platelets: 243 10*3/uL (ref 150–440)
RBC: 3.68 MIL/uL — AB (ref 3.80–5.20)
RDW: 18.6 % — ABNORMAL HIGH (ref 11.5–14.5)
WBC: 6.3 10*3/uL (ref 3.6–11.0)

## 2015-05-09 LAB — MAGNESIUM: MAGNESIUM: 2 mg/dL (ref 1.7–2.4)

## 2015-05-09 LAB — COMPREHENSIVE METABOLIC PANEL
ALK PHOS: 84 U/L (ref 38–126)
ALT: 12 U/L — AB (ref 14–54)
AST: 16 U/L (ref 15–41)
Albumin: 3.4 g/dL — ABNORMAL LOW (ref 3.5–5.0)
Anion gap: 4 — ABNORMAL LOW (ref 5–15)
BILIRUBIN TOTAL: 0.4 mg/dL (ref 0.3–1.2)
BUN: 16 mg/dL (ref 6–20)
CALCIUM: 9 mg/dL (ref 8.9–10.3)
CHLORIDE: 106 mmol/L (ref 101–111)
CO2: 27 mmol/L (ref 22–32)
CREATININE: 0.88 mg/dL (ref 0.44–1.00)
Glucose, Bld: 87 mg/dL (ref 65–99)
Potassium: 3.7 mmol/L (ref 3.5–5.1)
Sodium: 137 mmol/L (ref 135–145)
TOTAL PROTEIN: 6.6 g/dL (ref 6.5–8.1)

## 2015-05-09 MED ORDER — HEPARIN SOD (PORK) LOCK FLUSH 100 UNIT/ML IV SOLN
500.0000 [IU] | Freq: Once | INTRAVENOUS | Status: AC | PRN
  Administered 2015-05-09: 500 [IU]
  Filled 2015-05-09: qty 5

## 2015-05-09 MED ORDER — SODIUM CHLORIDE 0.9 % IV SOLN
450.0000 mg | Freq: Once | INTRAVENOUS | Status: AC
  Administered 2015-05-09: 450 mg via INTRAVENOUS
  Filled 2015-05-09: qty 45

## 2015-05-09 MED ORDER — PEGFILGRASTIM 6 MG/0.6ML ~~LOC~~ PSKT
6.0000 mg | PREFILLED_SYRINGE | Freq: Once | SUBCUTANEOUS | Status: AC
  Administered 2015-05-09: 6 mg via SUBCUTANEOUS
  Filled 2015-05-09: qty 0.6

## 2015-05-09 MED ORDER — SODIUM CHLORIDE 0.9 % IV SOLN
175.0000 mg/m2 | Freq: Once | INTRAVENOUS | Status: AC
  Administered 2015-05-09: 330 mg via INTRAVENOUS
  Filled 2015-05-09: qty 55

## 2015-05-09 MED ORDER — SODIUM CHLORIDE 0.9 % IV SOLN
Freq: Once | INTRAVENOUS | Status: AC
  Administered 2015-05-09: 10:00:00 via INTRAVENOUS
  Filled 2015-05-09: qty 1000

## 2015-05-09 MED ORDER — SODIUM CHLORIDE 0.9 % IV SOLN
Freq: Once | INTRAVENOUS | Status: AC
  Administered 2015-05-09: 11:00:00 via INTRAVENOUS
  Filled 2015-05-09: qty 5

## 2015-05-09 MED ORDER — SODIUM CHLORIDE 0.9 % IJ SOLN
10.0000 mL | INTRAMUSCULAR | Status: DC | PRN
  Administered 2015-05-09: 10 mL
  Filled 2015-05-09: qty 10

## 2015-05-09 MED ORDER — PACLITAXEL CHEMO INJECTION 300 MG/50ML: 175.0000 mg/m2 | Freq: Once | INTRAVENOUS | Status: DC

## 2015-05-09 MED ORDER — INFLUENZA VAC SPLIT QUAD 0.5 ML IM SUSY
0.5000 mL | PREFILLED_SYRINGE | Freq: Once | INTRAMUSCULAR | Status: AC
  Administered 2015-05-09: 0.5 mL via INTRAMUSCULAR
  Filled 2015-05-09: qty 0.5

## 2015-05-09 MED ORDER — PALONOSETRON HCL INJECTION 0.25 MG/5ML
0.2500 mg | Freq: Once | INTRAVENOUS | Status: AC
  Administered 2015-05-09: 0.25 mg via INTRAVENOUS
  Filled 2015-05-09: qty 5

## 2015-05-09 NOTE — Progress Notes (Signed)
[place] @ [facility] [phone]  Fax:([phone]) [place]: 10-Oct-1968  MR#: [id number]  IFO#:[id number]  Patient Care Team: Jerrol Banana., MD as PCP - General (Family Medicine) [facility] Gaetana Michaelis, MD as Referring Physician (Obstetrics and Gynecology) Robert Bellow, MD as Consulting Physician (General Surgery)  CHIEF COMPLAINT:  Chief Complaint  Patient presents with  . 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 2.   carcinoma  of breast ( left) status post lumpectomy ,adiation  therapyand 5 years of tamoxifen. 3.history of atrial fibrillation being managed by cardiologist 4.  Status post bilateral salpingo-oophorectomy on fourth of May, 2016.  with optimal debulking surgery 5.  Starting chemotherapy with carboplatinum and Taxol in dose dense fashion from Jan 02, 2015 5.  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      Oncology Flowsheet 02/07/2015 02/14/2015 02/28/2015 03/07/2015 03/28/2015 04/11/2015 04/18/2015  Day, Cycle Day 1, Cycle 2 2, Cycle 2 Day 1, Cycle 3 Day 8, Cycle 3 Day 1, Cycle 4; Day 1, Cycle 1 - Day 1, Cycle 2  CARBOplatin (PARAPLATIN) IV 450 mg - 450 mg - 450 mg - 450 mg  dexamethasone (DECADRON) IJ - - - - - - -  dexamethasone (DECADRON) IV [ 12 mg ] [ 20 mg ] [ 12 mg ] [ 20 mg ] [ 12 mg ] - [ 12 mg ]  enoxaparin (LOVENOX) [place] - - - - - 40 mg -  fosaprepitant (EMEND) IV [ 150 mg ] - [ 150 mg ] - [ 150 mg ] - [ 150 mg ]  LORazepam (ATIVAN) PO - - - - - 0.25 mg -  ondansetron (ZOFRAN) IV - [ 8 mg ] - [ 8 mg ] - - -  PACLitaxel (TAXOL) IV 80 mg/m2 80 mg/m2 90 mg/m2 90 mg/m2 175 mg/m2 - 150 mg/m2  palonosetron (ALOXI) IV 0.25 mg - 0.25 mg - 0.25 mg - 0.25 mg  pegfilgrastim (NEULASTA ONPRO KIT) [place] - - - - 6 mg - 6 mg  scopolamine 1 MG/3DAYS (TRANSDERM-SCOP) TD - - - - - - -    INTERVAL  HISTORY: 74 year old lady with a history of ovarian cancer here to initiate next cycle of chemotherapy.  This is the third cycle of treatment.  Patient is here for ongoing evaluation.  Patient is missing consistently day 15 Taxol so treatment is going to be switched over to standard carboplatinum and Taxol therapy with Taxol 175 mg/m on day 1 and chemotherapy repeated every [redacted] weeks along with Neulasta.  Patient is quite concerned about Neulasta because he is watched all the advertisements about side effect. April 18, 2015 Patient is here for the fifth cycle of chemotherapy.  Patient last week had a high fever was admitted in the hospital overnight.  Gradually recovered.  Appetite has been fairly good.  Feeling somewhat weak and tired.  Tinting.  No numbness. May 09, 2015 Patient is here for the sixth and the last chemotherapy.  Neuropathy grade 1 has resolved.  Patient started playing when necessary oh.  No nausea no vomiting rash is off and on. REVIEW OF SYSTEMS:    general status: Patient is feeling weak and tired.  No change in a performance status.  No chills.  No fever. HEENT: Alopecia.  No evidence of stomatitis Lungs: No cough or  shortness of breath Cardiac: No chest pain or paroxysmal nocturnal dyspnea GI: No nausea no vomiting no diarrhea no abdominal pain Skin: No rash GU: No dysuria or hematuria Lower extremity no swelling Neurological system: No tingling.  No numbness.  No other focal signs Musculoskeletal system no bony pains  As per HPI. Otherwise, a complete review of systems is negatve.  PAST MEDICAL HISTORY: Past Medical History  Diagnosis Date  . Atrial fibrillation   . Hyperlipidemia   . PVC (premature ventricular contraction)   . Mitral insufficiency   . GERD (gastroesophageal reflux disease)   . Cancer of breast     Left  . Hypothyroidism   . Ovarian cancer     s/p BSO optimal tumor debulking May 2016    PAST SURGICAL HISTORY: Past Surgical History    Procedure Laterality Date  . Cesarean section      x2  . Abdominal hysterectomy    . Cholecystectomy    . Colonoscopy  2006  . Breast surgery      Left  . Laparotomy with staging N/A 12/22/2014    Procedure: LAPAROTOMY WITH STAGING;  Surgeon: Gillis Ends, MD;  Location: [facility] ORS;  Service: Gynecology;  Laterality: N/A;  . Salpingoophorectomy Bilateral 12/22/2014    Procedure: SALPINGO OOPHORECTOMY;  Surgeon: Gillis Ends, MD;  Location: [facility] ORS;  Service: Gynecology;  Laterality: Bilateral;  . Debulking N/A 12/22/2014    Procedure: DEBULKING;  Surgeon: Gillis Ends, MD;  Location: [facility] ORS;  Service: Gynecology;  Laterality: N/A;  . Laparotomy N/A 12/22/2014    Procedure: EXPLORATORY LAPAROTOMY;  Surgeon: Robert Bellow, MD;  Location: [facility] OR[facility];  Service: General;  Laterality: N/A;  . Portacath placement Right 12/22/2014    Procedure: INSERTION PORT-A-CATH;  Surgeon: Robert Bellow, MD;  Location: [facility] OR[facility];  Service: General;  Laterality: Right;  . Peripheral vascular catheterization Left 12/22/2014    Procedure: PORTA CATH INSERTION;  Surgeon: Robert Bellow, MD;  Location: [facility] OR[facility];  Service: General;  Laterality: Left;  . Bilateral salpingoophorectomy  May 2016    with optimal tumor debulking     FAMILY HISTORY Family History  Problem Relation Age of Onset  . Cancer Mother     breast, throat, and stomach  . Heart disease Father   . Pulmonary embolism Sister   . Pulmonary embolism Other   . Cancer Other   . Cancer Brother 60    angiosarcoma of the chest; carcinoid small intestinal tumor  . Cancer Maternal Aunt     breast cancer  . Cancer Maternal Grandfather 45    pancreatic    ADVANCED DIRECTIVES:  Patient does have advance healthcare directive, Patient   does not desire to make any changes  HEALTH MAINTENANCE: Social History  Substance Use Topics  . Smoking status: Never Smoker   . Smokeless tobacco: Never Used  . Alcohol Use: No       Allergies  Allergen Reactions  . Carafate [Sucralfate] Rash  . Sulfa Antibiotics Rash    Current Outpatient Prescriptions  Medication Sig Dispense Refill  . acetaminophen (TYLENOL) 500 MG chewable tablet Chew 500 mg by mouth every 6 (six) hours as needed for pain.    Marland Kitchen docusate sodium (COLACE) 50 MG capsule Take 50 mg by mouth daily as needed.     Marland Kitchen levofloxacin (LEVAQUIN) 500 MG tablet Take 1 tablet (500 mg total) by mouth daily. 7 tablet 0  . levothyroxine (SYNTHROID, LEVOTHROID) 50 MCG tablet Take 50 mcg  by mouth daily before breakfast.    . lidocaine-prilocaine (EMLA) cream Apply 1 application topically as needed. 30 g 0  . LORazepam (ATIVAN) 0.5 MG tablet Take 1 tablet (0.5 mg total) by mouth at bedtime. 30 tablet 3  . ondansetron (ZOFRAN) 4 MG tablet Take 1 tablet (4 mg total) by mouth every 6 (six) hours as needed for nausea or vomiting. 30 tablet 3  . pantoprazole (PROTONIX) 40 MG tablet Take 40 mg by mouth daily.    . polyethylene glycol (MIRALAX / GLYCOLAX) packet Take 17 g by mouth daily.    . propafenone (RYTHMOL) 225 MG tablet Take 225 mg by mouth 2 (two) times daily.     No current facility-administered medications for this visit.   Facility-Administered Medications Ordered in Other Visits  Medication Dose Route Frequency Provider Last Rate Last Dose  . heparin lock flush 100 unit/mL  500 Units Intracatheter Once PRN Forest Gleason, MD      . sodium chloride 0.9 % injection 10 mL  10 mL Intracatheter PRN Forest Gleason, MD   10 mL at 02/07/15 0930  . sodium chloride 0.9 % injection 10 mL  10 mL Intracatheter PRN Forest Gleason, MD   10 mL at 05/09/15 0910    OBJECTIVE:  Filed Vitals:   05/09/15 0938  BP: 129/73  Pulse: 76  Temp: 96.8 F (36 C)     Body mass index is 29.88 kg/(m^2).    ECOG FS:1 - Symptomatic but completely ambulatory  PHYSICAL EXAM: GENERAL:  Well developed, well nourished, sitting comfortably in the exam room in no acute distress. MENTAL  STATUS:  Alert and oriented to person, place and time. HEAD:   ALOPECIA  Normocephalic, atraumatic, face symmetric, no Cushingoid features. EYES:  Pupils equal round and reactive to light and accomodation.  No conjunctivitis or scleral icterus. ENT:  Oropharynx clear without lesion.  Tongue normal. Mucous membranes moist.  RESPIRATORY:  Clear to auscultation without rales, wheezes or rhonchi. CARDIOVASCULAR:  Regular rate and rhythm without murmur, rub or gallop. BREAST:  Right breast without masses, skin changes or nipple discharge.  Left breast without masses, skin changes or nipple discharge. ABDOMEN:  Soft, non-tender, with active bowel sounds, and no hepatosplenomegaly.  No masses. BACK:  No CVA tenderness.  No tenderness on percussion of the back or rib cage. SKIN:  No rashes, ulcers or lesions. EXTREMITIES: No edema, no skin discoloration or tenderness.  No palpable cords. LYMPH NODES: No palpable cervical, supraclavicular, axillary or inguinal adenopathy  NEUROLOGICAL: Unremarkable. PSYCH:  Appropriate.   LAB RESULTS:  Infusion on 05/09/2015  Component Date Value Ref Range Status  . WBC 05/09/2015 6.3  3.6 - 11.0 K/uL Final   A-LINE DRAW  . RBC 05/09/2015 3.68* 3.80 - 5.20 MIL/uL Final  . Hemoglobin 05/09/2015 12.1  12.0 - 16.0 g/dL Final  . HCT 05/09/2015 35.7  35.0 - 47.0 % Final  . MCV 05/09/2015 97.0  80.0 - 100.0 fL Final  . MCH 05/09/2015 32.8  26.0 - 34.0 pg Final  . MCHC 05/09/2015 33.8  32.0 - 36.0 g/dL Final  . RDW 05/09/2015 18.6* 11.5 - 14.5 % Final  . Platelets 05/09/2015 243  150 - 440 K/uL Final  . Neutrophils Relative % 05/09/2015 51   Final  . Neutro Abs 05/09/2015 3.2  1.4 - 6.5 K/uL Final  . Lymphocytes Relative 05/09/2015 34   Final  . Lymphs Abs 05/09/2015 2.1  1.0 - 3.6 K/uL Final  . Monocytes Relative 05/09/2015  13   Final  . Monocytes Absolute 05/09/2015 0.8  0.2 - 0.9 K/uL Final  . Eosinophils Relative 05/09/2015 1   Final  . Eosinophils Absolute  05/09/2015 0.0  0 - 0.7 K/uL Final  . Basophils Relative 05/09/2015 1   Final  . Basophils Absolute 05/09/2015 0.0  0 - 0.1 K/uL Final  . Sodium 05/09/2015 137  135 - 145 mmol/L Final  . Potassium 05/09/2015 3.7  3.5 - 5.1 mmol/L Final  . Chloride 05/09/2015 106  101 - 111 mmol/L Final  . CO2 05/09/2015 27  22 - 32 mmol/L Final  . Glucose, Bld 05/09/2015 87  65 - 99 mg/dL Final  . BUN 05/09/2015 16  6 - 20 mg/dL Final  . Creatinine, Ser 05/09/2015 0.88  0.44 - 1.00 mg/dL Final  . Calcium 05/09/2015 9.0  8.9 - 10.3 mg/dL Final  . Total Protein 05/09/2015 6.6  6.5 - 8.1 g/dL Final  . Albumin 05/09/2015 3.4* 3.5 - 5.0 g/dL Final  . AST 05/09/2015 16  15 - 41 U/L Final  . ALT 05/09/2015 12* 14 - 54 U/L Final  . Alkaline Phosphatase 05/09/2015 84  38 - 126 U/L Final  . Total Bilirubin 05/09/2015 0.4  0.3 - 1.2 mg/dL Final  . GFR calc non Af Amer 05/09/2015 >60  >60 mL/min Final  . GFR calc Af Amer 05/09/2015 >60  >60 mL/min Final   Comment: (NOTE) The eGFR has been calculated using the CKD EPI equation. This calculation has not been validated in all clinical situations. eGFR's persistently <60 mL/min signify possible Chronic Kidney Disease.   . Anion gap 05/09/2015 4* 5 - 15 Final  . Magnesium 05/09/2015 2.0  1.7 - 2.4 mg/dL Final      ASSESSMENT: OVARIAN  cancer stage III Continue chemotherapy with carboplatinum and Taxol.   Proceed with this last chemotherapy.  Possibility of maintenance with a Avastin versus consolidation chemotherapy and would discussed situation with Dr. Theora Gianotti. Patient does have ATM genetic mutation. As recommended patient's cancer risk for breast cancer is 17% to 52% Currently there are no specific recommendation except for routine mammogram breast self-examination. Increased chance for pancreatic cancer and there is no specific recommendation. There are no specific recommendation for the family other than routine evaluation. This has been discussed with the  patient and family.  We also discussed possibility of GOG protocol for lifestyle intervention Total duration of visit was 45 minutes.  50% or more time was spent in counseling patient and family regarding prognosis and options of treatment and available resources Patient will be scheduled for mammogram And a CT scan    MEDICAL DECISION MAKING:  All lab data has been reviewed.  Dose of Taxol was reduced because of myelosuppression and fever in spite of Neulasta. Patient will be reevaluated in 3 weeks for next cycle of chemotherapy    Staging form: [place], AJCC 7th Edition     Clinical: Stage IIIC (T3c, N0, M0) - Marni Griffon, MD   05/09/2015 10:04 AM

## 2015-05-09 NOTE — Progress Notes (Signed)
Patient wants to talk about maintenance after her last treatment.  Also wants to discuss port a cath.  States it bothers her.

## 2015-05-16 ENCOUNTER — Inpatient Hospital Stay: Payer: PPO

## 2015-05-16 DIAGNOSIS — Z5111 Encounter for antineoplastic chemotherapy: Secondary | ICD-10-CM | POA: Diagnosis not present

## 2015-05-16 DIAGNOSIS — C569 Malignant neoplasm of unspecified ovary: Secondary | ICD-10-CM

## 2015-05-16 LAB — CBC WITH DIFFERENTIAL/PLATELET
BASOS PCT: 1 %
Basophils Absolute: 0.1 10*3/uL (ref 0–0.1)
EOS ABS: 0.1 10*3/uL (ref 0–0.7)
Eosinophils Relative: 1 %
HEMATOCRIT: 34 % — AB (ref 35.0–47.0)
HEMOGLOBIN: 11.4 g/dL — AB (ref 12.0–16.0)
LYMPHS ABS: 2.9 10*3/uL (ref 1.0–3.6)
Lymphocytes Relative: 31 %
MCH: 33.1 pg (ref 26.0–34.0)
MCHC: 33.5 g/dL (ref 32.0–36.0)
MCV: 98.7 fL (ref 80.0–100.0)
MONO ABS: 1.4 10*3/uL — AB (ref 0.2–0.9)
MONOS PCT: 15 %
NEUTROS ABS: 4.9 10*3/uL (ref 1.4–6.5)
NEUTROS PCT: 52 %
Platelets: 126 10*3/uL — ABNORMAL LOW (ref 150–440)
RBC: 3.45 MIL/uL — ABNORMAL LOW (ref 3.80–5.20)
RDW: 17.5 % — AB (ref 11.5–14.5)
WBC: 9.4 10*3/uL (ref 3.6–11.0)

## 2015-05-23 ENCOUNTER — Inpatient Hospital Stay: Payer: PPO | Attending: Oncology

## 2015-05-23 DIAGNOSIS — Z79899 Other long term (current) drug therapy: Secondary | ICD-10-CM | POA: Diagnosis not present

## 2015-05-23 DIAGNOSIS — Z9223 Personal history of estrogen therapy: Secondary | ICD-10-CM | POA: Diagnosis not present

## 2015-05-23 DIAGNOSIS — Z90722 Acquired absence of ovaries, bilateral: Secondary | ICD-10-CM | POA: Insufficient documentation

## 2015-05-23 DIAGNOSIS — Z923 Personal history of irradiation: Secondary | ICD-10-CM | POA: Diagnosis not present

## 2015-05-23 DIAGNOSIS — R5383 Other fatigue: Secondary | ICD-10-CM | POA: Diagnosis not present

## 2015-05-23 DIAGNOSIS — D759 Disease of blood and blood-forming organs, unspecified: Secondary | ICD-10-CM | POA: Diagnosis not present

## 2015-05-23 DIAGNOSIS — R509 Fever, unspecified: Secondary | ICD-10-CM | POA: Diagnosis not present

## 2015-05-23 DIAGNOSIS — Z853 Personal history of malignant neoplasm of breast: Secondary | ICD-10-CM | POA: Insufficient documentation

## 2015-05-23 DIAGNOSIS — K219 Gastro-esophageal reflux disease without esophagitis: Secondary | ICD-10-CM | POA: Diagnosis not present

## 2015-05-23 DIAGNOSIS — R531 Weakness: Secondary | ICD-10-CM | POA: Diagnosis not present

## 2015-05-23 DIAGNOSIS — I4891 Unspecified atrial fibrillation: Secondary | ICD-10-CM | POA: Insufficient documentation

## 2015-05-23 DIAGNOSIS — C561 Malignant neoplasm of right ovary: Secondary | ICD-10-CM | POA: Insufficient documentation

## 2015-05-23 DIAGNOSIS — T451X5S Adverse effect of antineoplastic and immunosuppressive drugs, sequela: Secondary | ICD-10-CM | POA: Diagnosis not present

## 2015-05-23 DIAGNOSIS — E039 Hypothyroidism, unspecified: Secondary | ICD-10-CM | POA: Insufficient documentation

## 2015-05-23 DIAGNOSIS — C569 Malignant neoplasm of unspecified ovary: Secondary | ICD-10-CM

## 2015-05-23 DIAGNOSIS — G620 Drug-induced polyneuropathy: Secondary | ICD-10-CM | POA: Diagnosis not present

## 2015-05-23 DIAGNOSIS — Z9221 Personal history of antineoplastic chemotherapy: Secondary | ICD-10-CM | POA: Diagnosis not present

## 2015-05-23 DIAGNOSIS — Z90710 Acquired absence of both cervix and uterus: Secondary | ICD-10-CM | POA: Insufficient documentation

## 2015-05-23 DIAGNOSIS — E785 Hyperlipidemia, unspecified: Secondary | ICD-10-CM | POA: Diagnosis not present

## 2015-05-23 LAB — CBC WITH DIFFERENTIAL/PLATELET
BASOS PCT: 0 %
Basophils Absolute: 0 10*3/uL (ref 0–0.1)
EOS ABS: 0.1 10*3/uL (ref 0–0.7)
EOS PCT: 1 %
HCT: 35.1 % (ref 35.0–47.0)
HEMOGLOBIN: 11.6 g/dL — AB (ref 12.0–16.0)
LYMPHS ABS: 2.6 10*3/uL (ref 1.0–3.6)
Lymphocytes Relative: 40 %
MCH: 32.5 pg (ref 26.0–34.0)
MCHC: 33 g/dL (ref 32.0–36.0)
MCV: 98.4 fL (ref 80.0–100.0)
MONO ABS: 0.4 10*3/uL (ref 0.2–0.9)
MONOS PCT: 7 %
NEUTROS PCT: 52 %
Neutro Abs: 3.4 10*3/uL (ref 1.4–6.5)
Platelets: 180 10*3/uL (ref 150–440)
RBC: 3.56 MIL/uL — ABNORMAL LOW (ref 3.80–5.20)
RDW: 17.3 % — AB (ref 11.5–14.5)
WBC: 6.5 10*3/uL (ref 3.6–11.0)

## 2015-06-06 ENCOUNTER — Ambulatory Visit
Admission: RE | Admit: 2015-06-06 | Discharge: 2015-06-06 | Disposition: A | Discharge status: Home / Self Care | Payer: PPO | Source: Ambulatory Visit | Attending: Oncology | Admitting: Oncology

## 2015-06-06 DIAGNOSIS — C569 Malignant neoplasm of unspecified ovary: Secondary | ICD-10-CM

## 2015-06-06 DIAGNOSIS — Z8543 Personal history of malignant neoplasm of ovary: Secondary | ICD-10-CM | POA: Diagnosis not present

## 2015-06-06 DIAGNOSIS — K5790 Diverticulosis of intestine, part unspecified, without perforation or abscess without bleeding: Secondary | ICD-10-CM | POA: Diagnosis not present

## 2015-06-06 DIAGNOSIS — K449 Diaphragmatic hernia without obstruction or gangrene: Secondary | ICD-10-CM | POA: Insufficient documentation

## 2015-06-06 DIAGNOSIS — M5136 Other intervertebral disc degeneration, lumbar region: Secondary | ICD-10-CM | POA: Diagnosis not present

## 2015-06-06 DIAGNOSIS — M47816 Spondylosis without myelopathy or radiculopathy, lumbar region: Secondary | ICD-10-CM | POA: Diagnosis not present

## 2015-06-06 DIAGNOSIS — I700 Atherosclerosis of aorta: Secondary | ICD-10-CM | POA: Insufficient documentation

## 2015-06-06 DIAGNOSIS — Z08 Encounter for follow-up examination after completed treatment for malignant neoplasm: Secondary | ICD-10-CM | POA: Insufficient documentation

## 2015-06-06 DIAGNOSIS — Z1231 Encounter for screening mammogram for malignant neoplasm of breast: Secondary | ICD-10-CM | POA: Insufficient documentation

## 2015-06-06 MED ORDER — IOHEXOL 300 MG/ML  SOLN
100.0000 mL | Freq: Once | INTRAMUSCULAR | Status: AC | PRN
  Administered 2015-06-06: 100 mL via INTRAVENOUS

## 2015-06-08 ENCOUNTER — Inpatient Hospital Stay: Payer: PPO

## 2015-06-08 ENCOUNTER — Inpatient Hospital Stay (HOSPITAL_BASED_OUTPATIENT_CLINIC_OR_DEPARTMENT_OTHER): Payer: PPO | Admitting: Oncology

## 2015-06-08 VITALS — BP 115/76 | HR 83 | Temp 95.9°F | Ht 64.0 in | Wt 175.9 lb

## 2015-06-08 DIAGNOSIS — Z9221 Personal history of antineoplastic chemotherapy: Secondary | ICD-10-CM

## 2015-06-08 DIAGNOSIS — Z90710 Acquired absence of both cervix and uterus: Secondary | ICD-10-CM

## 2015-06-08 DIAGNOSIS — Z90722 Acquired absence of ovaries, bilateral: Secondary | ICD-10-CM

## 2015-06-08 DIAGNOSIS — C561 Malignant neoplasm of right ovary: Secondary | ICD-10-CM | POA: Diagnosis not present

## 2015-06-08 DIAGNOSIS — E039 Hypothyroidism, unspecified: Secondary | ICD-10-CM

## 2015-06-08 DIAGNOSIS — Z853 Personal history of malignant neoplasm of breast: Secondary | ICD-10-CM | POA: Diagnosis not present

## 2015-06-08 DIAGNOSIS — Z9223 Personal history of estrogen therapy: Secondary | ICD-10-CM

## 2015-06-08 DIAGNOSIS — C569 Malignant neoplasm of unspecified ovary: Secondary | ICD-10-CM

## 2015-06-08 DIAGNOSIS — R785 Finding of other psychotropic drug in blood: Secondary | ICD-10-CM

## 2015-06-08 DIAGNOSIS — Z923 Personal history of irradiation: Secondary | ICD-10-CM

## 2015-06-08 DIAGNOSIS — I4891 Unspecified atrial fibrillation: Secondary | ICD-10-CM

## 2015-06-08 DIAGNOSIS — R5383 Other fatigue: Secondary | ICD-10-CM

## 2015-06-08 DIAGNOSIS — R531 Weakness: Secondary | ICD-10-CM

## 2015-06-08 LAB — COMPREHENSIVE METABOLIC PANEL
ALK PHOS: 85 U/L (ref 38–126)
ALT: 15 U/L (ref 14–54)
ANION GAP: 5 (ref 5–15)
AST: 19 U/L (ref 15–41)
Albumin: 4 g/dL (ref 3.5–5.0)
BILIRUBIN TOTAL: 0.4 mg/dL (ref 0.3–1.2)
BUN: 15 mg/dL (ref 6–20)
CALCIUM: 9.1 mg/dL (ref 8.9–10.3)
CO2: 26 mmol/L (ref 22–32)
Chloride: 105 mmol/L (ref 101–111)
Creatinine, Ser: 0.87 mg/dL (ref 0.44–1.00)
Glucose, Bld: 88 mg/dL (ref 65–99)
Potassium: 4.4 mmol/L (ref 3.5–5.1)
Sodium: 136 mmol/L (ref 135–145)
TOTAL PROTEIN: 7 g/dL (ref 6.5–8.1)

## 2015-06-08 LAB — CBC WITH DIFFERENTIAL/PLATELET
BASOS ABS: 0 10*3/uL (ref 0–0.1)
BASOS PCT: 1 %
EOS ABS: 0.1 10*3/uL (ref 0–0.7)
Eosinophils Relative: 2 %
HEMATOCRIT: 40.3 % (ref 35.0–47.0)
HEMOGLOBIN: 13.4 g/dL (ref 12.0–16.0)
Lymphocytes Relative: 48 %
Lymphs Abs: 2.7 10*3/uL (ref 1.0–3.6)
MCH: 33 pg (ref 26.0–34.0)
MCHC: 33.4 g/dL (ref 32.0–36.0)
MCV: 98.8 fL (ref 80.0–100.0)
Monocytes Absolute: 0.7 10*3/uL (ref 0.2–0.9)
Monocytes Relative: 12 %
NEUTROS ABS: 2.2 10*3/uL (ref 1.4–6.5)
NEUTROS PCT: 39 %
Platelets: 246 10*3/uL (ref 150–440)
RBC: 4.08 MIL/uL (ref 3.80–5.20)
RDW: 15.5 % — AB (ref 11.5–14.5)
WBC: 5.6 10*3/uL (ref 3.6–11.0)

## 2015-06-08 NOTE — Progress Notes (Signed)
Patient is here for results of CT scan, is anticipating treatment.  Port last used on 9/19, if no treatment planned would like to schedule port flush.  Patient also states that mouth is sore and she has numbness in her hands and feet

## 2015-06-09 LAB — CA 125: CA 125: 14.9 U/mL (ref 0.0–38.1)

## 2015-06-12 ENCOUNTER — Encounter: Payer: Self-pay | Admitting: Oncology

## 2015-06-12 NOTE — Progress Notes (Signed)
[address] @ [facility] Telephone:([phone]  Fax:(336) [place]: 02/09/1941  MR#: [id number]  VVZ#:[id number]  Patient Care Team: Jerrol Banana., MD as PCP - General (Family Medicine) [facility] Gaetana Michaelis, MD as Referring Physician (Obstetrics and Gynecology) Robert Bellow, MD as Consulting Physician (General Surgery)  CHIEF COMPLAINT:  Chief Complaint  Patient presents with  . Ovarian Cancer    results of CT scan, possible treatment depending on test, numbness in hands and feets, mouth is sore    Chief Complaint/Diagnosis:   clinically suspected carcinomaof ovary(right) clinical stagingT3 cN0 M0 stage III Further workup pending Abnormal CT scanin April 2 016. 2.   carcinoma  of breast ( left) status post lumpectomy ,adiation  therapyand 5 years of tamoxifen. 3.history of atrial fibrillation being managed by cardiologist 4.  Status post bilateral salpingo-oophorectomy on fourth of May, 2016.  with optimal debulking surgery 5.  Starting chemotherapy with carboplatinum and Taxol in dose dense fashion from Jan 02, 2015 5.  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      Oncology Flowsheet 02/14/2015 02/28/2015 03/07/2015 03/28/2015 04/11/2015 04/18/2015 05/09/2015  Day, Cycle 2, Cycle 2 Day 1, Cycle 3 Day 8, Cycle 3 Day 1, Cycle 4; Day 1, Cycle 1 - Day 1, Cycle 2 Day 1, Cycle 3  CARBOplatin (PARAPLATIN) IV - 450 mg - 450 mg - 450 mg 450 mg  dexamethasone (DECADRON) IJ - - - - - - -  dexamethasone (DECADRON) IV [ 20 mg ] [ 12 mg ] [ 20 mg ] [ 12 mg ] - [ 12 mg ] [ 12 mg ]  enoxaparin (LOVENOX) [place] - - - - 40 mg - -  fosaprepitant (EMEND) IV - [ 150 mg ] - [ 150 mg ] - [ 150 mg ] [ 150 mg ]  LORazepam (ATIVAN) PO - - - - 0.25 mg - -  ondansetron (ZOFRAN) IV [ 8 mg ] - [ 8 mg ] - - - -  PACLitaxel (TAXOL) IV 80 mg/m2 90 mg/m2 90 mg/m2 175 mg/m2 - 150 mg/m2 175 mg/m2  palonosetron (ALOXI) IV - 0.25 mg - 0.25 mg - 0.25 mg 0.25 mg  pegfilgrastim  (NEULASTA ONPRO KIT) [place] - - - 6 mg - 6 mg 6 mg  scopolamine 1 MG/3DAYS (TRANSDERM-SCOP) TD - - - - - - -    INTERVAL HISTORY: 74 year old lady with a history of ovarian cancer here to initiate next cycle of chemotherapy.  This is the third cycle of treatment.  Patient has finished total 6 cycles of chemotherapy.  No abdominal pain.  No nausea no vomiting and a repeat CT scan and tumor markers.  No chills.  No fever.  No abdominal pain.  Appetite has been stable.  No tingling.  No numbness. REVIEW OF SYSTEMS:    general status: Patient is feeling weak and tired.  No change in a performance status.  No chills.  No fever. HEENT: Alopecia.  No evidence of stomatitis Lungs: No cough or shortness of breath Cardiac: No chest pain or paroxysmal nocturnal dyspnea GI: No nausea no vomiting no diarrhea no abdominal pain Skin: No rash GU: No dysuria or hematuria Lower extremity no swelling Neurological system: No tingling.  No numbness.  No other focal signs Musculoskeletal system no bony pains  As per HPI. Otherwise, a complete review of systems is negatve.  PAST MEDICAL HISTORY: Past Medical History  Diagnosis Date  . Atrial  fibrillation ([place])   . Hyperlipidemia   . PVC (premature ventricular contraction)   . Mitral insufficiency   . GERD (gastroesophageal reflux disease)   . Hypothyroidism   . Cancer of breast ([place]) 1992    Left  . Ovarian cancer [facility])     s/p BSO optimal tumor debulking May 2016    PAST SURGICAL HISTORY: Past Surgical History  Procedure Laterality Date  . Cesarean section      x2  . Abdominal hysterectomy    . Cholecystectomy    . Colonoscopy  2006  . Breast surgery      Left  . Laparotomy with staging N/A 12/22/2014    Procedure: LAPAROTOMY WITH STAGING;  Surgeon: Gillis Ends, MD;  Location: [facility] ORS;  Service: Gynecology;  Laterality: N/A;  . Salpingoophorectomy Bilateral 12/22/2014    Procedure: SALPINGO OOPHORECTOMY;  Surgeon: Gillis Ends, MD;  Location: [facility] ORS;  Service: Gynecology;  Laterality: Bilateral;  . Debulking N/A 12/22/2014    Procedure: DEBULKING;  Surgeon: Gillis Ends, MD;  Location: [facility] ORS;  Service: Gynecology;  Laterality: N/A;  . Laparotomy N/A 12/22/2014    Procedure: EXPLORATORY LAPAROTOMY;  Surgeon: Robert Bellow, MD;  Location: [facility] ORS;  Service: General;  Laterality: N/A;  . Portacath placement Right 12/22/2014    Procedure: INSERTION PORT-A-CATH;  Surgeon: Robert Bellow, MD;  Location: [facility] ORS;  Service: General;  Laterality: Right;  . Peripheral vascular catheterization Left 12/22/2014    Procedure: PORTA CATH INSERTION;  Surgeon: Robert Bellow, MD;  Location: [facility] ORS;  Service: General;  Laterality: Left;  . Bilateral salpingoophorectomy  May 2016    with optimal tumor debulking     FAMILY HISTORY Family History  Problem Relation Age of Onset  . Cancer Mother     breast, throat, and stomach  . Breast cancer Mother   . Heart disease Father   . Pulmonary embolism Sister   . Pulmonary embolism Other   . Cancer Other   . Cancer Brother 60    angiosarcoma of the chest; carcinoid small intestinal tumor  . Cancer Maternal Aunt     breast cancer  . Cancer Maternal Grandfather 82    pancreatic    ADVANCED DIRECTIVES:  Patient does have advance healthcare directive, Patient   does not desire to make any changes  HEALTH MAINTENANCE: Social History  Substance Use Topics  . Smoking status: Never Smoker   . Smokeless tobacco: Never Used  . Alcohol Use: No      Allergies  Allergen Reactions  . Carafate [Sucralfate] Rash  . Sulfa Antibiotics Rash    Current Outpatient Prescriptions  Medication Sig Dispense Refill  . acetaminophen (TYLENOL) 500 MG chewable tablet Chew 500 mg by mouth every 6 (six) hours as needed for pain.    Marland Kitchen docusate sodium (COLACE) 50 MG capsule Take 50 mg by mouth daily as needed.     Marland Kitchen levofloxacin (LEVAQUIN) 500 MG tablet Take 1 tablet  (500 mg total) by mouth daily. 7 tablet 0  . levothyroxine (SYNTHROID, LEVOTHROID) 50 MCG tablet Take 50 mcg by mouth daily before breakfast.    . lidocaine-prilocaine (EMLA) cream Apply 1 application topically as needed. 30 g 0  . LORazepam (ATIVAN) 0.5 MG tablet Take 1 tablet (0.5 mg total) by mouth at bedtime. 30 tablet 3  . ondansetron (ZOFRAN) 4 MG tablet Take 1 tablet (4 mg total) by mouth every 6 (six) hours as needed for nausea or vomiting. [place]  tablet 3  . pantoprazole (PROTONIX) 40 MG tablet Take 40 mg by mouth daily.    . polyethylene glycol (MIRALAX / GLYCOLAX) packet Take 17 g by mouth daily.    . propafenone (RYTHMOL) 225 MG tablet Take 225 mg by mouth 2 (two) times daily.     No current facility-administered medications for this visit.   Facility-Administered Medications Ordered in Other Visits  Medication Dose Route Frequency Provider Last Rate Last Dose  . sodium chloride 0.9 % injection 10 mL  10 mL Intracatheter PRN Forest Gleason, MD   10 mL at 02/07/15 0930    OBJECTIVE:  Filed Vitals:   06/08/15 1052  BP: 115/76  Pulse: 83  Temp: 95.9 F (35.5 C)     Body mass index is 30.18 kg/(m^2).    ECOG FS:1 - Symptomatic but completely ambulatory  PHYSICAL EXAM: GENERAL:  Well developed, well nourished, sitting comfortably in the exam room in no acute distress. MENTAL STATUS:  Alert and oriented to person, place and time. HEAD:   ALOPECIA  Normocephalic, atraumatic, face symmetric, no Cushingoid features. EYES:  Pupils equal round and reactive to light and accomodation.  No conjunctivitis or scleral icterus. ENT:  Oropharynx clear without lesion.  Tongue normal. Mucous membranes moist.  RESPIRATORY:  Clear to auscultation without rales, wheezes or rhonchi. CARDIOVASCULAR:  Regular rate and rhythm without murmur, rub or gallop. BREAST:  Right breast without masses, skin changes or nipple discharge.  Left breast without masses, skin changes or nipple discharge. ABDOMEN:   Soft, non-tender, with active bowel sounds, and no hepatosplenomegaly.  No masses. BACK:  No CVA tenderness.  No tenderness on percussion of the back or rib cage. SKIN:  No rashes, ulcers or lesions. EXTREMITIES: No edema, no skin discoloration or tenderness.  No palpable cords. LYMPH NODES: No palpable cervical, supraclavicular, axillary or inguinal adenopathy  NEUROLOGICAL: Unremarkable. PSYCH:  Appropriate.   LAB RESULTS:  Appointment on 06/08/2015  Component Date Value Ref Range Status  . Sodium 06/08/2015 136  135 - 145 mmol/L Final  . Potassium 06/08/2015 4.4  3.5 - 5.1 mmol/L Final  . Chloride 06/08/2015 105  101 - 111 mmol/L Final  . CO2 06/08/2015 26  22 - 32 mmol/L Final  . Glucose, Bld 06/08/2015 88  65 - 99 mg/dL Final  . BUN 06/08/2015 15  6 - 20 mg/dL Final  . Creatinine, Ser 06/08/2015 0.87  0.44 - 1.00 mg/dL Final  . Calcium 06/08/2015 9.1  8.9 - 10.3 mg/dL Final  . Total Protein 06/08/2015 7.0  6.5 - 8.1 g/dL Final  . Albumin 06/08/2015 4.0  3.5 - 5.0 g/dL Final  . AST 06/08/2015 19  15 - 41 U/L Final  . ALT 06/08/2015 15  14 - 54 U/L Final  . Alkaline Phosphatase 06/08/2015 85  38 - 126 U/L Final  . Total Bilirubin 06/08/2015 0.4  0.3 - 1.2 mg/dL Final  . GFR calc non Af Amer 06/08/2015 >60  >60 mL/min Final  . GFR calc Af Amer 06/08/2015 >60  >60 mL/min Final   Comment: (NOTE) The eGFR has been calculated using the CKD EPI equation. This calculation has not been validated in all clinical situations. eGFR's persistently <60 mL/min signify possible Chronic Kidney Disease.   . Anion gap 06/08/2015 5  5 - 15 Final  . CA 125 06/08/2015 14.9  0.0 - 38.1 U/mL Final   Comment: (NOTE) Roche ECLIA methodology Performed At: [facility] [address] [place], [place] [id number] [place]  Darrick Penna MD KW:[id number]   . WBC 06/08/2015 5.6  3.6 - 11.0 K/uL Final  . RBC 06/08/2015 4.08  3.80 - 5.20 MIL/uL Final  . Hemoglobin 06/08/2015 13.4  12.0 - 16.0 g/dL  Final  . HCT 06/08/2015 40.3  35.0 - 47.0 % Final  . MCV 06/08/2015 98.8  80.0 - 100.0 fL Final  . MCH 06/08/2015 33.0  26.0 - 34.0 pg Final  . MCHC 06/08/2015 33.4  32.0 - 36.0 g/dL Final  . RDW 06/08/2015 15.5* 11.5 - 14.5 % Final  . Platelets 06/08/2015 246  150 - 440 K/uL Final  . Neutrophils Relative % 06/08/2015 39   Final  . Neutro Abs 06/08/2015 2.2  1.4 - 6.5 K/uL Final  . Lymphocytes Relative 06/08/2015 48   Final  . Lymphs Abs 06/08/2015 2.7  1.0 - 3.6 K/uL Final  . Monocytes Relative 06/08/2015 12   Final  . Monocytes Absolute 06/08/2015 0.7  0.2 - 0.9 K/uL Final  . Eosinophils Relative 06/08/2015 2   Final  . Eosinophils Absolute 06/08/2015 0.1  0 - 0.7 K/uL Final  . Basophils Relative 06/08/2015 1   Final  . Basophils Absolute 06/08/2015 0.0  0 - 0.1 K/uL Final    CA 125 (June 05, 2015) 14.9 ASSESSMENT: OVARIAN  cancer stage III Continue chemotherapy with carboplatinum and Taxol.   Proceed with this last chemotherapy.  Possibility of maintenance with a Avastin versus consolidation chemotherapy and would discussed situation with Dr. Theora Gianotti. Patient does have ATM genetic mutation. As recommended patient's cancer risk for breast cancer is 17% to 52% Currently there are no specific recommendation except for routine mammogram breast self-examination. Increased chance for pancreatic cancer and there is no specific recommendation. There are no specific recommendation for the family other than routine evaluation. This has been discussed with the patient and family. 2.  CT scan has been reviewed independently there is no evidence of recurrent disease CA 125 is 14.9 We discussed possibility of maintenance therapy after discussing with Dr. Theora Gianotti Continue follow-up without any intervention Total duration of visit was 45 minutes.  50% or more time was spent in counseling patient and family regarding prognosis and options of treatment and available resources We will continue  discussion regarding GOG study   We also discussed possibility of GOG protocol for lifestyle intervention Total duration of visit was30 minutes.  50% or more time was spent in counseling patient and family regarding prognosis and options of treatment and available resources Patient will be scheduled for mammogram And a CT scan  2.Neuropathy persists.  If it is bothering her too much then Neurontin would be advised.   MEDICAL DECISION MAKING:  All lab data has been reviewed.  Dose of Taxol was reduced because of myelosuppression and fever in spite of Neulasta. Patient will be reevaluated in 3 weeks for next cycle of chemotherapy    Staging form: Ovary, AJCC 7th Edition     Clinical: Stage IIIC (T3c, N0, M0) - Marni Griffon, MD   06/12/2015 9:28 AM

## 2015-06-15 ENCOUNTER — Inpatient Hospital Stay (HOSPITAL_BASED_OUTPATIENT_CLINIC_OR_DEPARTMENT_OTHER): Payer: PPO | Admitting: Obstetrics and Gynecology

## 2015-06-15 ENCOUNTER — Encounter: Payer: Self-pay | Admitting: *Deleted

## 2015-06-15 ENCOUNTER — Encounter: Payer: Self-pay | Admitting: Obstetrics and Gynecology

## 2015-06-15 ENCOUNTER — Inpatient Hospital Stay (HOSPITAL_BASED_OUTPATIENT_CLINIC_OR_DEPARTMENT_OTHER): Payer: PPO | Admitting: Oncology

## 2015-06-15 VITALS — BP 122/81 | HR 71 | Temp 97.8°F | Ht 64.0 in | Wt 175.0 lb

## 2015-06-15 DIAGNOSIS — R509 Fever, unspecified: Secondary | ICD-10-CM

## 2015-06-15 DIAGNOSIS — T451X5S Adverse effect of antineoplastic and immunosuppressive drugs, sequela: Secondary | ICD-10-CM

## 2015-06-15 DIAGNOSIS — C561 Malignant neoplasm of right ovary: Secondary | ICD-10-CM | POA: Diagnosis not present

## 2015-06-15 DIAGNOSIS — R531 Weakness: Secondary | ICD-10-CM

## 2015-06-15 DIAGNOSIS — Z9223 Personal history of estrogen therapy: Secondary | ICD-10-CM

## 2015-06-15 DIAGNOSIS — Z853 Personal history of malignant neoplasm of breast: Secondary | ICD-10-CM | POA: Diagnosis not present

## 2015-06-15 DIAGNOSIS — Z90710 Acquired absence of both cervix and uterus: Secondary | ICD-10-CM

## 2015-06-15 DIAGNOSIS — D759 Disease of blood and blood-forming organs, unspecified: Secondary | ICD-10-CM

## 2015-06-15 DIAGNOSIS — Z90722 Acquired absence of ovaries, bilateral: Secondary | ICD-10-CM

## 2015-06-15 DIAGNOSIS — G620 Drug-induced polyneuropathy: Secondary | ICD-10-CM

## 2015-06-15 DIAGNOSIS — Z9221 Personal history of antineoplastic chemotherapy: Secondary | ICD-10-CM

## 2015-06-15 DIAGNOSIS — C569 Malignant neoplasm of unspecified ovary: Secondary | ICD-10-CM

## 2015-06-15 DIAGNOSIS — Z923 Personal history of irradiation: Secondary | ICD-10-CM

## 2015-06-15 NOTE — Progress Notes (Signed)
Assited MD with pelvic exam.

## 2015-06-15 NOTE — Progress Notes (Signed)
Gynecologic Oncology Interval Visit   Referring Provider: Blain Pais, MD.  Chief Concern: Continued surveillance for stage IIIc ovarian cancer  Subjective:  Jamie Conner is a 74 y.o. female who is seen for continued surveillance for stage IIIc ovarian cancer.   She has undergone 6 cycles of chemotherapy completed 05/09/2015 and presents today for a pelvic exam. Overall she tolerated chemotherapy quite well. Her dose of Taxol was reduced because of myelosuppression and fever in spite of Neulasta. She has no complaints today.    Lab Results  Component Value Date   CA125 14.9 06/08/2015    06/06/2015 CT scan abdomen and pelvis IMPRESSION: 1. Interval removal of the large right ovarian mass . Dramatic improvement in the appearance of mesenteric and omental implants. There is a 3 mm potential faint omental nodule at about the level of the umbilicus but for the most part the numerous prior omental and mesenteric tumor implants have resolved completely. 2. Small type 1 hiatal hernia. 3. Segment 7 hepatic hemangioma, chronically stable. 4. 1.8 by 1.2 cm fluid density structure along the splenic hilum, no change from prior, likely a small benign cystic lesion, but meriting observation in the context of the patient' s ovarian cancer. 5. Other imaging findings of potential clinical significance: 3 cm periampullary duodenal diverticulum. Aortoiliac atherosclerotic vascular disease. Mild lower lumbar spondylosis and degenerative disc disease.  Gynecologic Oncology History Jamie Conner is a pleasant female who is seen for postoperative visit for stage IIIc ovarian cancer. See prior notes for complete detail.  She also has a history of  carcinoma of breast ( left) status post lumpectomy , radiation therapy and 5 years of tamoxifen. She underwent exploratory Laparotomy, bilateral salpingo-oophorectomy, right ureterolysis, infragastric omentectomy, and optimally debulked to no  gross residual  May 4th, 2016. Preop CA125 2354.0.  She started chemotherapy with carboplatinum and Taxol in dose dense fashion from Jan 02, 2015   Genetic testing: ATM mutation [id number].  I spoke with Dr. Lisbeth Ply at [facility] and he recommended genetic counseling for the patient and possibly other family members. He provided a phone number for them to call to schedule that appointment. The number is [phone].  Problem List: Patient Active Problem List   Diagnosis Date Noted  . Hypothyroid 04/11/2015  . Benign essential HTN 02/01/2015  . Retroperitoneal fibrosis   . Combined fat and carbohydrate induced hyperlipemia 01/04/2015  . Awareness of heartbeats 01/04/2015  . Beat, premature ventricular 01/04/2015  . Ovarian cancer ([place]) 12/16/2014  . MI (mitral incompetence) 09/02/2014  . AF (paroxysmal atrial fibrillation) ([place]) 09/02/2014    Past Medical History: Past Medical History  Diagnosis Date  . Atrial fibrillation ([place])   . Hyperlipidemia   . PVC (premature ventricular contraction)   . Mitral insufficiency   . GERD (gastroesophageal reflux disease)   . Hypothyroidism   . Cancer of breast ([place]) 1992    Left  . Ovarian cancer [facility])     s/p BSO optimal tumor debulking May 2016    Past Surgical History: Past Surgical History  Procedure Laterality Date  . Cesarean section      x2  . Abdominal hysterectomy    . Cholecystectomy    . Colonoscopy  2006  . Breast surgery      Left  . Laparotomy with staging N/A 12/22/2014    Procedure: LAPAROTOMY WITH STAGING;  Surgeon: Gillis Ends, MD;  Location: [facility] ORS;  Service: Gynecology;  Laterality: N/A;  . Salpingoophorectomy Bilateral 12/22/2014  Procedure: SALPINGO OOPHORECTOMY;  Surgeon: Gillis Ends, MD;  Location: [facility] ORS;  Service: Gynecology;  Laterality: Bilateral;  . Debulking N/A 12/22/2014    Procedure: DEBULKING;  Surgeon: Gillis Ends, MD;  Location: [facility] ORS;  Service: Gynecology;   Laterality: N/A;  . Laparotomy N/A 12/22/2014    Procedure: EXPLORATORY LAPAROTOMY;  Surgeon: Robert Bellow, MD;  Location: [facility] ORS;  Service: General;  Laterality: N/A;  . Portacath placement Right 12/22/2014    Procedure: INSERTION PORT-A-CATH;  Surgeon: Robert Bellow, MD;  Location: [facility] ORS;  Service: General;  Laterality: Right;  . Peripheral vascular catheterization Left 12/22/2014    Procedure: PORTA CATH INSERTION;  Surgeon: Robert Bellow, MD;  Location: [facility] ORS;  Service: General;  Laterality: Left;  . Bilateral salpingoophorectomy  May 2016    with optimal tumor debulking     Past Gynecologic History:  See HPI  OB History:  OB History  Gravida Para Term Preterm AB SAB TAB Ectopic Multiple Living  2         2    # Outcome Date GA Lbr Len/2nd Weight Sex Delivery Anes PTL Lv  2 Gravida           1 Gravida             Obstetric Comments  1st Menstrual Cycle:  14  1st Pregnancy:  45    Family History: Family History  Problem Relation Age of Onset  . Cancer Mother     breast, throat, and stomach  . Breast cancer Mother   . Heart disease Father   . Pulmonary embolism Sister   . Pulmonary embolism Other   . Cancer Other   . Cancer Brother 60    angiosarcoma of the chest; carcinoid small intestinal tumor  . Cancer Maternal Aunt     breast cancer  . Cancer Maternal Grandfather 63    pancreatic    Social History: Social History   Social History  . Marital Status: Married    Spouse Name: N/A  . Number of Children: N/A  . Years of Education: N/A   Occupational History  . Not on file.   Social History Main Topics  . Smoking status: Never Smoker   . Smokeless tobacco: Never Used  . Alcohol Use: No  . Drug Use: No  . Sexual Activity: Not Currently   Other Topics Concern  . Not on file   Social History Narrative    Allergies: Allergies  Allergen Reactions  . Carafate [Sucralfate] Rash  . Sulfa Antibiotics Rash    Current  Medications: Current Outpatient Prescriptions  Medication Sig Dispense Refill  . acetaminophen (TYLENOL) 500 MG chewable tablet Chew 500 mg by mouth every 6 (six) hours as needed for pain.    Marland Kitchen docusate sodium (COLACE) 50 MG capsule Take 50 mg by mouth daily as needed.     Marland Kitchen levothyroxine (SYNTHROID, LEVOTHROID) 50 MCG tablet Take 50 mcg by mouth daily before breakfast.    . lidocaine-prilocaine (EMLA) cream Apply 1 application topically as needed. 30 g 0  . LORazepam (ATIVAN) 0.5 MG tablet Take 1 tablet (0.5 mg total) by mouth at bedtime. 30 tablet 3  . ondansetron (ZOFRAN) 4 MG tablet Take 1 tablet (4 mg total) by mouth every 6 (six) hours as needed for nausea or vomiting. 30 tablet 3  . pantoprazole (PROTONIX) 40 MG tablet Take 40 mg by mouth daily.    . polyethylene glycol (MIRALAX / GLYCOLAX) packet Take  17 g by mouth daily.    . propafenone (RYTHMOL) 225 MG tablet Take 225 mg by mouth 2 (two) times daily.     No current facility-administered medications for this visit.   Facility-Administered Medications Ordered in Other Visits  Medication Dose Route Frequency Provider Last Rate Last Dose  . sodium chloride 0.9 % injection 10 mL  10 mL Intracatheter PRN Forest Gleason, MD   10 mL at 02/07/15 0930    Review of Systems General: no complaints  HEENT: no complaints  Lungs: no complaints  Cardiac: no complaints  GI: no complaints  GU: no complaints  Musculoskeletal: no complaints  Extremities: no complaints  Skin: no complaints  Neuro: no complaints  Endocrine: no complaints  Psych: no complaints       Objective:  Physical Examination:  BP 122/81 mmHg  Pulse 71  Temp(Src) 97.8 F (36.6 C) (Oral)  Ht _0  (1.626 m)  Wt 175 lb 0.7 oz (79.4 kg)  BMI 30.03 kg/m2   ECOG Performance Status: 0 - Asymptomatic  General appearance: alert, cooperative and appears stated age 70: ATNC CV: RRR Lungs: BCTA Lymph node survey: non-palpable inguinal, axillary and [place]  nodes Abdomen: SNDNT. Incision all well healed. No hernias, no masses, no ascites.  Extremities:No edema Skin exam - Well healed incisions.  Neurological exam reveals alert, oriented, normal speech, no focal findings or movement disorder noted  Pelvic: Vulva: normal appearing vulva with no masses, tenderness or lesions; Vagina: normal; Adnexa: negative for masses or nodularity; Uterus and Cervix: surgically absent; Rectal: confirms    Lab Review N/a  Radiologic Imaging: none    Assessment:  ALAISHA EVERSLEY is a 73 y.o. female diagnosed with optimally debulked stage IIIc ovarian cancer s/p chemotherapy with no definitive evidence of disease on CT scan and normalization with CA125 with negative exam. ATM mutation. Extensive family history of cancer (breast, pancreatic, throat, gastric, adenosarcoma of the chest, carcinoid tumor of the small bowel) in several first degree relatives.   Plan:   Problem List Items Addressed This Visit    None    Visit Diagnoses    Disseminated ovarian cancer, unspecified laterality ([place])    -  Primary       She will follow up with Dr. Oliva Bustard. We discussed the possibility of maintenance/consolidation therapy previously, but the most recent GOG212 study did not meet its primary survival endpoint. No further details have been provided. Given this information I would not recommend continued chemotherapy. She may be a candidate for the GOG225 study.   We will begin alternating surveillance with Dr. Oliva Bustard every 3 months. If she does go on the GOG225 study the follow up may be different. She will follow up with me in 6 months.   I recommended that her family members undergo genetic counseling. She said her brother and sister would not be interested. I did suggest she discuss with her son and daughter.      Gillis Ends, MD    CC:  Blain Pais, MD.

## 2015-06-15 NOTE — Patient Instructions (Signed)
  I spoke with Dr. Lisbeth Ply at [facility] and he recommended genetic counseling for the patient and possibly other family members. He provided a phone number for them to call to schedule that appointment. The number is [phone].

## 2015-06-18 ENCOUNTER — Encounter: Payer: Self-pay | Admitting: Oncology

## 2015-06-18 NOTE — Progress Notes (Signed)
[place] @ [facility] [phone]  Fax:([phone]) [place]: [birth year]  MR#: [id number]  BDZ#:[id number]  Patient Care Team: Jerrol Banana., MD as PCP - General (Family Medicine) [facility] Gaetana Michaelis, MD as Referring Physician (Obstetrics and Gynecology) Robert Bellow, MD as Consulting Physician (General Surgery)  CHIEF COMPLAINT:  No chief complaint on file.   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 2.   carcinoma  of breast ( left) status post lumpectomy ,adiation  therapyand 5 years of tamoxifen. 3.history of atrial fibrillation being managed by cardiologist 4.  Status post bilateral salpingo-oophorectomy on fourth of May, 2016.  with optimal debulking surgery 5.  Starting chemotherapy with carboplatinum and Taxol in dose dense fashion from Jan 02, 2015 5.  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      Oncology Flowsheet 02/14/2015 02/28/2015 03/07/2015 03/28/2015 04/11/2015 04/18/2015 05/09/2015  Day, Cycle 2, Cycle 2 Day 1, Cycle 3 Day 8, Cycle 3 Day 1, Cycle 4; Day 1, Cycle 1 - Day 1, Cycle 2 Day 1, Cycle 3  CARBOplatin (PARAPLATIN) IV - 450 mg - 450 mg - 450 mg 450 mg  dexamethasone (DECADRON) IJ - - - - - - -  dexamethasone (DECADRON) IV [ 20 mg ] [ 12 mg ] [ 20 mg ] [ 12 mg ] - [ 12 mg ] [ 12 mg ]  enoxaparin (LOVENOX) [place] - - - - 40 mg - -  fosaprepitant (EMEND) IV - [ 150 mg ] - [ 150 mg ] - [ 150 mg ] [ 150 mg ]  LORazepam (ATIVAN) PO - - - - 0.25 mg - -  ondansetron (ZOFRAN) IV [ 8 mg ] - [ 8 mg ] - - - -  PACLitaxel (TAXOL) IV 80 mg/m2 90 mg/m2 90 mg/m2 175 mg/m2 - 150 mg/m2 175 mg/m2  palonosetron (ALOXI) IV - 0.25 mg - 0.25 mg - 0.25 mg 0.25 mg  pegfilgrastim (NEULASTA ONPRO KIT) [place] - - - 6 mg - 6 mg 6 mg  scopolamine 1 MG/3DAYS (TRANSDERM-SCOP) TD - - - - - - -    INTERVAL  HISTORY: 74 year old lady with a history of ovarian cancer here to initiate next cycle of chemotherapy.  This is the third cycle of treatment.  Patient has finished total 6 cycles of chemotherapy.  No abdominal pain.  No nausea no vomiting and a repeat CT scan and tumor markers.  No chills.  No fever.  No abdominal pain.  Appetite has been stable.  No tingling.  No numbness.  Patient is here for ongoing evaluation and continuation of treatment.  Patient has finished total 4 cycles of treatment and repeat CT scan has been done today patient has been evaluated by GYN oncologist. REVIEW OF SYSTEMS:    general status: Patient is feeling weak and tired.  No change in a performance status.  No chills.  No fever. HEENT: Alopecia.  No evidence of stomatitis Lungs: No cough or shortness of breath Cardiac: No chest pain or paroxysmal nocturnal dyspnea GI: No nausea no vomiting no diarrhea no abdominal pain Skin: No rash GU: No dysuria or hematuria Lower extremity no swelling Neurological system: No tingling.  No numbness.  No other focal signs Musculoskeletal system no bony pains  As per HPI. Otherwise, a complete review of systems is negatve.  PAST MEDICAL HISTORY:  Past Medical History  Diagnosis Date  . Atrial fibrillation ([place])   . Hyperlipidemia   . PVC (premature ventricular contraction)   . Mitral insufficiency   . GERD (gastroesophageal reflux disease)   . Hypothyroidism   . Cancer of breast ([place]) 1992    Left  . Ovarian cancer [facility])     s/p BSO optimal tumor debulking May 2016    PAST SURGICAL HISTORY: Past Surgical History  Procedure Laterality Date  . Cesarean section      x2  . Abdominal hysterectomy    . Cholecystectomy    . Colonoscopy  2006  . Breast surgery      Left  . Laparotomy with staging N/A 12/22/2014    Procedure: LAPAROTOMY WITH STAGING;  Surgeon: Gillis Ends, MD;  Location: [facility] ORS;  Service: Gynecology;  Laterality: N/A;  . Salpingoophorectomy  Bilateral 12/22/2014    Procedure: SALPINGO OOPHORECTOMY;  Surgeon: Gillis Ends, MD;  Location: [facility] ORS;  Service: Gynecology;  Laterality: Bilateral;  . Debulking N/A 12/22/2014    Procedure: DEBULKING;  Surgeon: Gillis Ends, MD;  Location: [facility] ORS;  Service: Gynecology;  Laterality: N/A;  . Laparotomy N/A 12/22/2014    Procedure: EXPLORATORY LAPAROTOMY;  Surgeon: Robert Bellow, MD;  Location: [facility] ORS;  Service: General;  Laterality: N/A;  . Portacath placement Right 12/22/2014    Procedure: INSERTION PORT-A-CATH;  Surgeon: Robert Bellow, MD;  Location: [facility] ORS;  Service: General;  Laterality: Right;  . Peripheral vascular catheterization Left 12/22/2014    Procedure: PORTA CATH INSERTION;  Surgeon: Robert Bellow, MD;  Location: [facility] ORS;  Service: General;  Laterality: Left;  . Bilateral salpingoophorectomy  May 2016    with optimal tumor debulking     FAMILY HISTORY Family History  Problem Relation Age of Onset  . Cancer Mother     breast, throat, and stomach  . Breast cancer Mother   . Heart disease Father   . Pulmonary embolism Sister   . Pulmonary embolism Other   . Cancer Other   . Cancer Brother 60    angiosarcoma of the chest; carcinoid small intestinal tumor  . Cancer Maternal Aunt     breast cancer  . Cancer Maternal Grandfather 39    pancreatic    ADVANCED DIRECTIVES:  Patient does have advance healthcare directive, Patient   does not desire to make any changes  HEALTH MAINTENANCE: Social History  Substance Use Topics  . Smoking status: Never Smoker   . Smokeless tobacco: Never Used  . Alcohol Use: No      Allergies  Allergen Reactions  . Carafate [Sucralfate] Rash  . Sulfa Antibiotics Rash    Current Outpatient Prescriptions  Medication Sig Dispense Refill  . acetaminophen (TYLENOL) 500 MG chewable tablet Chew 500 mg by mouth every 6 (six) hours as needed for pain.    Marland Kitchen docusate sodium (COLACE) 50 MG capsule Take 50 mg by  mouth daily as needed.     Marland Kitchen levothyroxine (SYNTHROID, LEVOTHROID) 50 MCG tablet Take 50 mcg by mouth daily before breakfast.    . lidocaine-prilocaine (EMLA) cream Apply 1 application topically as needed. 30 g 0  . LORazepam (ATIVAN) 0.5 MG tablet Take 1 tablet (0.5 mg total) by mouth at bedtime. 30 tablet 3  . ondansetron (ZOFRAN) 4 MG tablet Take 1 tablet (4 mg total) by mouth every 6 (six) hours as needed for nausea or vomiting. 30 tablet 3  . pantoprazole (PROTONIX) 40 MG tablet Take  40 mg by mouth daily.    . polyethylene glycol (MIRALAX / GLYCOLAX) packet Take 17 g by mouth daily.    . propafenone (RYTHMOL) 225 MG tablet Take 225 mg by mouth 2 (two) times daily.     No current facility-administered medications for this visit.   Facility-Administered Medications Ordered in Other Visits  Medication Dose Route Frequency Provider Last Rate Last Dose  . sodium chloride 0.9 % injection 10 mL  10 mL Intracatheter PRN Forest Gleason, MD   10 mL at 02/07/15 0930    OBJECTIVE:  There were no vitals filed for this visit.   There is no weight on file to calculate BMI.    ECOG FS:1 - Symptomatic but completely ambulatory  PHYSICAL EXAM: GENERAL:  Well developed, well nourished, sitting comfortably in the exam room in no acute distress. MENTAL STATUS:  Alert and oriented to person, place and time. HEAD:   ALOPECIA  Normocephalic, atraumatic, face symmetric, no Cushingoid features. EYES:  Pupils equal round and reactive to light and accomodation.  No conjunctivitis or scleral icterus. ENT:  Oropharynx clear without lesion.  Tongue normal. Mucous membranes moist.  RESPIRATORY:  Clear to auscultation without rales, wheezes or rhonchi. CARDIOVASCULAR:  Regular rate and rhythm without murmur, rub or gallop. BREAST:  Right breast without masses, skin changes or nipple discharge.  Left breast without masses, skin changes or nipple discharge. ABDOMEN:  Soft, non-tender, with active bowel sounds, and no  hepatosplenomegaly.  No masses. BACK:  No CVA tenderness.  No tenderness on percussion of the back or rib cage. SKIN:  No rashes, ulcers or lesions. EXTREMITIES: No edema, no skin discoloration or tenderness.  No palpable cords. LYMPH NODES: No palpable cervical, supraclavicular, axillary or inguinal adenopathy  NEUROLOGICAL: Unremarkable. PSYCH:  Appropriate.   LAB RESULTS:  No visits with results within 2 Day(s) from this visit. Latest known visit with results is:  Appointment on 06/08/2015  Component Date Value Ref Range Status  . Sodium 06/08/2015 136  135 - 145 mmol/L Final  . Potassium 06/08/2015 4.4  3.5 - 5.1 mmol/L Final  . Chloride 06/08/2015 105  101 - 111 mmol/L Final  . CO2 06/08/2015 26  22 - 32 mmol/L Final  . Glucose, Bld 06/08/2015 88  65 - 99 mg/dL Final  . BUN 06/08/2015 15  6 - 20 mg/dL Final  . Creatinine, Ser 06/08/2015 0.87  0.44 - 1.00 mg/dL Final  . Calcium 06/08/2015 9.1  8.9 - 10.3 mg/dL Final  . Total Protein 06/08/2015 7.0  6.5 - 8.1 g/dL Final  . Albumin 06/08/2015 4.0  3.5 - 5.0 g/dL Final  . AST 06/08/2015 19  15 - 41 U/L Final  . ALT 06/08/2015 15  14 - 54 U/L Final  . Alkaline Phosphatase 06/08/2015 85  38 - 126 U/L Final  . Total Bilirubin 06/08/2015 0.4  0.3 - 1.2 mg/dL Final  . GFR calc non Af Amer 06/08/2015 >60  >60 mL/min Final  . GFR calc Af Amer 06/08/2015 >60  >60 mL/min Final   Comment: (NOTE) The eGFR has been calculated using the CKD EPI equation. This calculation has not been validated in all clinical situations. eGFR's persistently <60 mL/min signify possible Chronic Kidney Disease.   . Anion gap 06/08/2015 5  5 - 15 Final  . CA 125 06/08/2015 14.9  0.0 - 38.1 U/mL Final   Comment: (NOTE) Roche ECLIA methodology Performed At: [facility] [address] [place], [place] [id number] Lindon Romp  MD [id number]   . WBC 06/08/2015 5.6  3.6 - 11.0 K/uL Final  . RBC 06/08/2015 4.08  3.80 - 5.20 MIL/uL Final  .  Hemoglobin 06/08/2015 13.4  12.0 - 16.0 g/dL Final  . HCT 06/08/2015 40.3  35.0 - 47.0 % Final  . MCV 06/08/2015 98.8  80.0 - 100.0 fL Final  . MCH 06/08/2015 33.0  26.0 - 34.0 pg Final  . MCHC 06/08/2015 33.4  32.0 - 36.0 g/dL Final  . RDW 06/08/2015 15.5* 11.5 - 14.5 % Final  . Platelets 06/08/2015 246  150 - 440 K/uL Final  . Neutrophils Relative % 06/08/2015 39   Final  . Neutro Abs 06/08/2015 2.2  1.4 - 6.5 K/uL Final  . Lymphocytes Relative 06/08/2015 48   Final  . Lymphs Abs 06/08/2015 2.7  1.0 - 3.6 K/uL Final  . Monocytes Relative 06/08/2015 12   Final  . Monocytes Absolute 06/08/2015 0.7  0.2 - 0.9 K/uL Final  . Eosinophils Relative 06/08/2015 2   Final  . Eosinophils Absolute 06/08/2015 0.1  0 - 0.7 K/uL Final  . Basophils Relative 06/08/2015 1   Final  . Basophils Absolute 06/08/2015 0.0  0 - 0.1 K/uL Final    CA 125 (June 05, 2015) 14.9 ASSESSMENT: OVARIAN  cancer stage III Continue chemotherapy with carboplatinum and Taxol.   Proceed with this last chemotherapy.  Possibility of maintenance with a Avastin versus consolidation chemotherapy and would discussed situation with Dr. Theora Gianotti. Patient does have ATM genetic mutation. As recommended patient's cancer risk for breast cancer is 17% to 52% Currently there are no specific recommendation except for routine mammogram breast self-examination. Increased chance for pancreatic cancer and there is no specific recommendation. There are no specific recommendation for the family other than routine evaluation. This has been discussed with the patient and family. 2.  CT scan has been reviewed independently there is no evidence of recurrent disease CA 125 is 14.9 We discussed possibility of maintenance therapy after discussing with Dr. Theora Gianotti Continue follow-up without any intervention Total duration of visit was 45 minutes.  50% or more time was spent in counseling patient and family regarding prognosis and options of treatment  and available resources We will continue discussion regarding GOG study   We also discussed possibility of GOG protocol for lifestyle intervention Total duration of visit was30 minutes.  50% or more time was spent in counseling patient and family regarding prognosis and options of treatment and available resources Patient will be scheduled for mammogram And a CT scan  2.Neuropathy persists.  If it is bothering her too much then Neurontin would be advised.   MEDICAL DECISION MAKING:  All lab data has been reviewed.  Dose of Taxol was reduced because of myelosuppression and fever in spite of Neulasta. Patient will be reevaluated in 3 weeks for next cycle of chemotherapy    Staging form: [place], AJCC 7th Edition     Clinical: Stage IIIC (T3c, N0, M0) - Marni Griffon, MD   06/18/2015 8:38 AM

## 2015-06-23 ENCOUNTER — Other Ambulatory Visit: Payer: Self-pay | Admitting: Family Medicine

## 2015-07-01 ENCOUNTER — Other Ambulatory Visit: Payer: Self-pay | Admitting: Oncology

## 2015-07-05 ENCOUNTER — Inpatient Hospital Stay: Payer: PPO

## 2015-07-05 ENCOUNTER — Inpatient Hospital Stay: Payer: PPO | Attending: Oncology

## 2015-07-05 DIAGNOSIS — Z90710 Acquired absence of both cervix and uterus: Secondary | ICD-10-CM | POA: Diagnosis not present

## 2015-07-05 DIAGNOSIS — Z853 Personal history of malignant neoplasm of breast: Secondary | ICD-10-CM | POA: Diagnosis not present

## 2015-07-05 DIAGNOSIS — Z9221 Personal history of antineoplastic chemotherapy: Secondary | ICD-10-CM | POA: Diagnosis not present

## 2015-07-05 DIAGNOSIS — Z90722 Acquired absence of ovaries, bilateral: Secondary | ICD-10-CM | POA: Insufficient documentation

## 2015-07-05 DIAGNOSIS — Z9223 Personal history of estrogen therapy: Secondary | ICD-10-CM | POA: Diagnosis not present

## 2015-07-05 DIAGNOSIS — C801 Malignant (primary) neoplasm, unspecified: Secondary | ICD-10-CM

## 2015-07-05 DIAGNOSIS — Z452 Encounter for adjustment and management of vascular access device: Secondary | ICD-10-CM | POA: Insufficient documentation

## 2015-07-05 DIAGNOSIS — C561 Malignant neoplasm of right ovary: Secondary | ICD-10-CM | POA: Insufficient documentation

## 2015-07-05 MED ORDER — HEPARIN SOD (PORK) LOCK FLUSH 100 UNIT/ML IV SOLN
500.0000 [IU] | Freq: Once | INTRAVENOUS | Status: AC
  Administered 2015-07-05: 500 [IU] via INTRAVENOUS
  Filled 2015-07-05: qty 5

## 2015-07-05 MED ORDER — SODIUM CHLORIDE 0.9 % IJ SOLN
10.0000 mL | INTRAMUSCULAR | Status: DC | PRN
  Administered 2015-07-05: 10 mL via INTRAVENOUS
  Filled 2015-07-05: qty 10

## 2015-07-05 NOTE — Progress Notes (Signed)
Survivorship Care Plan visit completed.  Treatment summary reviewed and given to patient.  ASCO answers booklet reviewed and given to patient.  Patient expressed interest in Cancer Transitions and the CARE program.  Information given.

## 2015-07-06 ENCOUNTER — Telehealth: Payer: Self-pay | Admitting: *Deleted

## 2015-07-06 ENCOUNTER — Inpatient Hospital Stay: Payer: PPO

## 2015-07-06 NOTE — Telephone Encounter (Signed)
Advised per Dr Oliva Bustard ok to have teeth cleaned

## 2015-07-06 NOTE — Telephone Encounter (Signed)
Sates she went to dentist as instructed but her mouth is no better. She is also asking if she can have her teeth cleaned

## 2015-07-09 ENCOUNTER — Encounter: Payer: Self-pay | Admitting: Oncology

## 2015-07-11 ENCOUNTER — Other Ambulatory Visit: Payer: Self-pay | Admitting: Family Medicine

## 2015-07-20 ENCOUNTER — Inpatient Hospital Stay: Payer: PPO | Admitting: Obstetrics and Gynecology

## 2015-07-20 ENCOUNTER — Other Ambulatory Visit: Payer: Self-pay | Admitting: *Deleted

## 2015-07-20 ENCOUNTER — Inpatient Hospital Stay: Payer: PPO

## 2015-07-20 DIAGNOSIS — C569 Malignant neoplasm of unspecified ovary: Secondary | ICD-10-CM

## 2015-07-20 DIAGNOSIS — C561 Malignant neoplasm of right ovary: Secondary | ICD-10-CM | POA: Diagnosis not present

## 2015-07-20 LAB — COMPREHENSIVE METABOLIC PANEL
ALK PHOS: 72 U/L (ref 38–126)
ALT: 13 U/L — ABNORMAL LOW (ref 14–54)
ANION GAP: 6 (ref 5–15)
AST: 17 U/L (ref 15–41)
Albumin: 3.8 g/dL (ref 3.5–5.0)
BILIRUBIN TOTAL: 0.5 mg/dL (ref 0.3–1.2)
BUN: 16 mg/dL (ref 6–20)
CALCIUM: 9.2 mg/dL (ref 8.9–10.3)
CO2: 28 mmol/L (ref 22–32)
Chloride: 101 mmol/L (ref 101–111)
Creatinine, Ser: 0.95 mg/dL (ref 0.44–1.00)
GFR calc non Af Amer: 58 mL/min — ABNORMAL LOW (ref >60)
Glucose, Bld: 81 mg/dL (ref 65–99)
Potassium: 4.2 mmol/L (ref 3.5–5.1)
SODIUM: 135 mmol/L (ref 135–145)
TOTAL PROTEIN: 7.1 g/dL (ref 6.5–8.1)

## 2015-07-20 LAB — CBC WITH DIFFERENTIAL/PLATELET
BASOS ABS: 0 10*3/uL (ref 0–0.1)
BASOS PCT: 1 %
EOS ABS: 0.1 10*3/uL (ref 0–0.7)
EOS PCT: 3 %
HEMATOCRIT: 43 % (ref 35.0–47.0)
HEMOGLOBIN: 14.3 g/dL (ref 12.0–16.0)
LYMPHS ABS: 2.5 10*3/uL (ref 1.0–3.6)
Lymphocytes Relative: 48 %
MCH: 31.8 pg (ref 26.0–34.0)
MCHC: 33.2 g/dL (ref 32.0–36.0)
MCV: 95.9 fL (ref 80.0–100.0)
Monocytes Absolute: 0.6 10*3/uL (ref 0.2–0.9)
Monocytes Relative: 11 %
NEUTROS PCT: 37 %
Neutro Abs: 1.9 10*3/uL (ref 1.4–6.5)
Platelets: 232 10*3/uL (ref 150–440)
RBC: 4.48 MIL/uL (ref 3.80–5.20)
RDW: 13.1 % (ref 11.5–14.5)
WBC: 5.2 10*3/uL (ref 3.6–11.0)

## 2015-07-20 NOTE — Progress Notes (Signed)
Gynecologic Oncology Interval Visit   Referring Provider: Blain Pais, MD.  Chief Concern: Continued surveillance for stage IIIc ovarian cancer  Subjective:  Jamie Conner is a 74 y.o. female who is seen for continued surveillance for stage IIIc ovarian cancer.   She has undergone 6 cycles of chemotherapy completed 05/09/2015 and presents today for a pelvic exam. Overall she tolerated chemotherapy quite well. Her dose of Taxol was reduced because of myelosuppression and fever in spite of Neulasta. She has no complaints today.    Lab Results  Component Value Date   CA125 14.9 06/08/2015    06/06/2015 CT scan abdomen and pelvis IMPRESSION: 1. Interval removal of the large right ovarian mass . Dramatic improvement in the appearance of mesenteric and omental implants. There is a 3 mm potential faint omental nodule at about the level of the umbilicus but for the most part the numerous prior omental and mesenteric tumor implants have resolved completely. 2. Small type 1 hiatal hernia. 3. Segment 7 hepatic hemangioma, chronically stable. 4. 1.8 by 1.2 cm fluid density structure along the splenic hilum, no change from prior, likely a small benign cystic lesion, but meriting observation in the context of the patient' s ovarian cancer. 5. Other imaging findings of potential clinical significance: 3 cm periampullary duodenal diverticulum. Aortoiliac atherosclerotic vascular disease. Mild lower lumbar spondylosis and degenerative disc disease.  Gynecologic Oncology History Jamie Conner is a pleasant female who is seen for postoperative visit for stage IIIc ovarian cancer. See prior notes for complete detail.  She also has a history of  carcinoma of breast ( left) status post lumpectomy , radiation therapy and 5 years of tamoxifen. She underwent exploratory Laparotomy, bilateral salpingo-oophorectomy, right ureterolysis, infragastric omentectomy, and optimally debulked to no  gross residual  May 4th, 2016. Preop CA125 2354.0.  She started chemotherapy with carboplatinum and Taxol in dose dense fashion from Jan 02, 2015   Genetic testing: ATM mutation [id number].  I spoke with Dr. Lisbeth Ply at [facility] and he recommended genetic counseling for the patient and possibly other family members. He provided a phone number for them to call to schedule that appointment. The number is [phone].  Problem List: Patient Active Problem List   Diagnosis Date Noted  . Hypothyroid 04/11/2015  . Benign essential HTN 02/01/2015  . Retroperitoneal fibrosis   . Combined fat and carbohydrate induced hyperlipemia 01/04/2015  . Awareness of heartbeats 01/04/2015  . Beat, premature ventricular 01/04/2015  . Ovarian cancer ([place]) 12/16/2014  . MI (mitral incompetence) 09/02/2014  . AF (paroxysmal atrial fibrillation) ([place]) 09/02/2014    Past Medical History: Past Medical History  Diagnosis Date  . Atrial fibrillation ([place])   . Hyperlipidemia   . PVC (premature ventricular contraction)   . Mitral insufficiency   . GERD (gastroesophageal reflux disease)   . Hypothyroidism   . Cancer of breast ([place]) 1992    Left  . Ovarian cancer [facility])     s/p BSO optimal tumor debulking May 2016    Past Surgical History: Past Surgical History  Procedure Laterality Date  . Cesarean section      x2  . Abdominal hysterectomy    . Cholecystectomy    . Colonoscopy  2006  . Breast surgery      Left  . Laparotomy with staging N/A 12/22/2014    Procedure: LAPAROTOMY WITH STAGING;  Surgeon: Gillis Ends, MD;  Location: [facility] ORS;  Service: Gynecology;  Laterality: N/A;  . Salpingoophorectomy Bilateral 12/22/2014  Procedure: SALPINGO OOPHORECTOMY;  Surgeon: Gillis Ends, MD;  Location: [facility] ORS;  Service: Gynecology;  Laterality: Bilateral;  . Debulking N/A 12/22/2014    Procedure: DEBULKING;  Surgeon: Gillis Ends, MD;  Location: [facility] ORS;  Service: Gynecology;   Laterality: N/A;  . Laparotomy N/A 12/22/2014    Procedure: EXPLORATORY LAPAROTOMY;  Surgeon: Robert Bellow, MD;  Location: [facility] ORS;  Service: General;  Laterality: N/A;  . Portacath placement Right 12/22/2014    Procedure: INSERTION PORT-A-CATH;  Surgeon: Robert Bellow, MD;  Location: [facility] ORS;  Service: General;  Laterality: Right;  . Peripheral vascular catheterization Left 12/22/2014    Procedure: PORTA CATH INSERTION;  Surgeon: Robert Bellow, MD;  Location: [facility] ORS;  Service: General;  Laterality: Left;  . Bilateral salpingoophorectomy  May 2016    with optimal tumor debulking     Past Gynecologic History:  See HPI  OB History:  OB History  Gravida Para Term Preterm AB SAB TAB Ectopic Multiple Living  2         2    # Outcome Date GA Lbr Len/2nd Weight Sex Delivery Anes PTL Lv  2 Gravida           1 Gravida             Obstetric Comments  1st Menstrual Cycle:  14  1st Pregnancy:  29    Family History: Family History  Problem Relation Age of Onset  . Cancer Mother     breast, throat, and stomach  . Breast cancer Mother   . Heart disease Father   . Pulmonary embolism Sister   . Pulmonary embolism Other   . Cancer Other   . Cancer Brother 60    angiosarcoma of the chest; carcinoid small intestinal tumor  . Cancer Maternal Aunt     breast cancer  . Cancer Maternal Grandfather 22    pancreatic    Social History: Social History   Social History  . Marital Status: Married    Spouse Name: N/A  . Number of Children: N/A  . Years of Education: N/A   Occupational History  . Not on file.   Social History Main Topics  . Smoking status: Never Smoker   . Smokeless tobacco: Never Used  . Alcohol Use: No  . Drug Use: No  . Sexual Activity: Not Currently   Other Topics Concern  . Not on file   Social History Narrative    Allergies: Allergies  Allergen Reactions  . Carafate [Sucralfate] Rash  . Sulfa Antibiotics Rash    Current  Medications: Current Outpatient Prescriptions  Medication Sig Dispense Refill  . acetaminophen (TYLENOL) 500 MG chewable tablet Chew 500 mg by mouth every 6 (six) hours as needed for pain.    Marland Kitchen docusate sodium (COLACE) 50 MG capsule Take 50 mg by mouth daily as needed.     Marland Kitchen levothyroxine (SYNTHROID, LEVOTHROID) 50 MCG tablet TAKE 1 TABLET EVERY DAY 30 tablet 12  . lidocaine-prilocaine (EMLA) cream APPLY TO AFFECTED AREA IF NEEDED 30 g 1  . LORazepam (ATIVAN) 0.5 MG tablet Take 1 tablet (0.5 mg total) by mouth at bedtime. 30 tablet 3  . ondansetron (ZOFRAN) 4 MG tablet Take 1 tablet (4 mg total) by mouth every 6 (six) hours as needed for nausea or vomiting. 30 tablet 3  . pantoprazole (PROTONIX) 40 MG tablet TAKE 1 TABLET EVERY DAY 30 tablet 12  . polyethylene glycol (MIRALAX / GLYCOLAX) packet Take 17 g  by mouth daily.    . propafenone (RYTHMOL) 225 MG tablet Take 225 mg by mouth 2 (two) times daily.     No current facility-administered medications for this visit.   Facility-Administered Medications Ordered in Other Visits  Medication Dose Route Frequency Provider Last Rate Last Dose  . sodium chloride 0.9 % injection 10 mL  10 mL Intracatheter PRN Forest Gleason, MD   10 mL at 02/07/15 0930    Review of Systems General: no complaints  HEENT: no complaints  Lungs: no complaints  Cardiac: no complaints  GI: no complaints  GU: no complaints  Musculoskeletal: no complaints  Extremities: no complaints  Skin: no complaints  Neuro: no complaints  Endocrine: no complaints  Psych: no complaints       Objective:  Physical Examination:  There were no vitals taken for this visit.   ECOG Performance Status: 0 - Asymptomatic  General appearance: alert, cooperative and appears stated age 57: ATNC CV: RRR Lungs: BCTA Lymph node survey: non-palpable inguinal, axillary and [place] nodes Abdomen: SNDNT. Incision all well healed. No hernias, no masses, no ascites.  Extremities:No  edema Skin exam - Well healed incisions.  Neurological exam reveals alert, oriented, normal speech, no focal findings or movement disorder noted  Pelvic: Vulva: normal appearing vulva with no masses, tenderness or lesions; Vagina: normal; Adnexa: negative for masses or nodularity; Uterus and Cervix: surgically absent; Rectal: confirms    Lab Review N/a  Radiologic Imaging: none    Assessment:  Jamie Conner is a 74 y.o. female diagnosed with optimally debulked stage IIIc ovarian cancer s/p chemotherapy with no definitive evidence of disease on CT scan and normalization with CA125 with negative exam. ATM mutation. Extensive family history of cancer (breast, pancreatic, throat, gastric, adenosarcoma of the chest, carcinoid tumor of the small bowel) in several first degree relatives.    Plan:   Problem List Items Addressed This Visit      Genitourinary   Ovarian cancer ([place]) - Primary (Chronic)      She will follow up with Dr. Oliva Bustard. We discussed the possibility of maintenance/consolidation therapy previously, but the most recent GOG212 study did not meet its primary survival endpoint. No further details have been provided. Given this information I would not recommend continued chemotherapy. She may be a candidate for the GOG225 study.   We will begin alternating surveillance with Dr. Oliva Bustard every 3 months. If she does go on the GOG225 study the follow up may be different. She will follow up with me in 6 months.   I recommended that her family members undergo genetic counseling. She said her brother and sister would not be interested. I did suggest she discuss with her son and daughter.      Mellody Drown, MD    CC:  Blain Pais, MD. This encounter was created in error - please disregard. This encounter was created in error - please disregard.

## 2015-07-21 LAB — CA 125: CA 125: 14.5 U/mL (ref 0.0–38.1)

## 2015-08-03 ENCOUNTER — Other Ambulatory Visit: Payer: PPO

## 2015-08-03 ENCOUNTER — Ambulatory Visit: Payer: PPO

## 2015-08-03 ENCOUNTER — Inpatient Hospital Stay: Payer: PPO | Attending: Oncology

## 2015-08-03 DIAGNOSIS — Z9223 Personal history of estrogen therapy: Secondary | ICD-10-CM | POA: Insufficient documentation

## 2015-08-03 DIAGNOSIS — Z853 Personal history of malignant neoplasm of breast: Secondary | ICD-10-CM | POA: Insufficient documentation

## 2015-08-03 DIAGNOSIS — Z90722 Acquired absence of ovaries, bilateral: Secondary | ICD-10-CM | POA: Insufficient documentation

## 2015-08-03 DIAGNOSIS — C801 Malignant (primary) neoplasm, unspecified: Secondary | ICD-10-CM

## 2015-08-03 DIAGNOSIS — Z9221 Personal history of antineoplastic chemotherapy: Secondary | ICD-10-CM | POA: Diagnosis not present

## 2015-08-03 DIAGNOSIS — Z90710 Acquired absence of both cervix and uterus: Secondary | ICD-10-CM | POA: Diagnosis not present

## 2015-08-03 DIAGNOSIS — Z452 Encounter for adjustment and management of vascular access device: Secondary | ICD-10-CM | POA: Insufficient documentation

## 2015-08-03 DIAGNOSIS — Z923 Personal history of irradiation: Secondary | ICD-10-CM | POA: Insufficient documentation

## 2015-08-03 DIAGNOSIS — C561 Malignant neoplasm of right ovary: Secondary | ICD-10-CM | POA: Insufficient documentation

## 2015-08-03 MED ORDER — HEPARIN SOD (PORK) LOCK FLUSH 100 UNIT/ML IV SOLN
INTRAVENOUS | Status: AC
  Filled 2015-08-03: qty 5

## 2015-08-03 MED ORDER — SODIUM CHLORIDE 0.9 % IJ SOLN
10.0000 mL | INTRAMUSCULAR | Status: DC | PRN
  Administered 2015-08-03: 10 mL via INTRAVENOUS
  Filled 2015-08-03: qty 10

## 2015-08-03 MED ORDER — HEPARIN SOD (PORK) LOCK FLUSH 100 UNIT/ML IV SOLN
500.0000 [IU] | Freq: Once | INTRAVENOUS | Status: AC
  Administered 2015-08-03: 500 [IU] via INTRAVENOUS

## 2015-08-31 ENCOUNTER — Inpatient Hospital Stay: Payer: PPO

## 2015-09-13 ENCOUNTER — Inpatient Hospital Stay: Payer: PPO | Attending: Oncology

## 2015-09-13 ENCOUNTER — Inpatient Hospital Stay: Payer: PPO

## 2015-09-13 DIAGNOSIS — Z923 Personal history of irradiation: Secondary | ICD-10-CM | POA: Insufficient documentation

## 2015-09-13 DIAGNOSIS — G629 Polyneuropathy, unspecified: Secondary | ICD-10-CM | POA: Insufficient documentation

## 2015-09-13 DIAGNOSIS — I4891 Unspecified atrial fibrillation: Secondary | ICD-10-CM | POA: Insufficient documentation

## 2015-09-13 DIAGNOSIS — Z79899 Other long term (current) drug therapy: Secondary | ICD-10-CM | POA: Diagnosis not present

## 2015-09-13 DIAGNOSIS — Z9221 Personal history of antineoplastic chemotherapy: Secondary | ICD-10-CM | POA: Insufficient documentation

## 2015-09-13 DIAGNOSIS — R531 Weakness: Secondary | ICD-10-CM | POA: Diagnosis not present

## 2015-09-13 DIAGNOSIS — C569 Malignant neoplasm of unspecified ovary: Secondary | ICD-10-CM

## 2015-09-13 DIAGNOSIS — Z9223 Personal history of estrogen therapy: Secondary | ICD-10-CM | POA: Diagnosis not present

## 2015-09-13 DIAGNOSIS — E785 Hyperlipidemia, unspecified: Secondary | ICD-10-CM | POA: Diagnosis not present

## 2015-09-13 DIAGNOSIS — K219 Gastro-esophageal reflux disease without esophagitis: Secondary | ICD-10-CM | POA: Diagnosis not present

## 2015-09-13 DIAGNOSIS — L658 Other specified nonscarring hair loss: Secondary | ICD-10-CM | POA: Diagnosis not present

## 2015-09-13 DIAGNOSIS — E039 Hypothyroidism, unspecified: Secondary | ICD-10-CM | POA: Insufficient documentation

## 2015-09-13 DIAGNOSIS — R5383 Other fatigue: Secondary | ICD-10-CM | POA: Diagnosis not present

## 2015-09-13 DIAGNOSIS — Z853 Personal history of malignant neoplasm of breast: Secondary | ICD-10-CM | POA: Insufficient documentation

## 2015-09-13 DIAGNOSIS — C561 Malignant neoplasm of right ovary: Secondary | ICD-10-CM | POA: Insufficient documentation

## 2015-09-13 DIAGNOSIS — Z90710 Acquired absence of both cervix and uterus: Secondary | ICD-10-CM | POA: Insufficient documentation

## 2015-09-13 DIAGNOSIS — Z90722 Acquired absence of ovaries, bilateral: Secondary | ICD-10-CM | POA: Diagnosis not present

## 2015-09-13 LAB — COMPREHENSIVE METABOLIC PANEL
ALBUMIN: 3.4 g/dL — AB (ref 3.5–5.0)
ALT: 14 U/L (ref 14–54)
AST: 15 U/L (ref 15–41)
Alkaline Phosphatase: 75 U/L (ref 38–126)
Anion gap: 7 (ref 5–15)
BUN: 13 mg/dL (ref 6–20)
CHLORIDE: 105 mmol/L (ref 101–111)
CO2: 25 mmol/L (ref 22–32)
CREATININE: 0.84 mg/dL (ref 0.44–1.00)
Calcium: 9 mg/dL (ref 8.9–10.3)
GFR calc Af Amer: >60 mL/min (ref >60)
GFR calc non Af Amer: >60 mL/min (ref >60)
Glucose, Bld: 105 mg/dL — ABNORMAL HIGH (ref 65–99)
Potassium: 3.7 mmol/L (ref 3.5–5.1)
SODIUM: 137 mmol/L (ref 135–145)
Total Bilirubin: 0.5 mg/dL (ref 0.3–1.2)
Total Protein: 6.8 g/dL (ref 6.5–8.1)

## 2015-09-13 LAB — CBC WITH DIFFERENTIAL/PLATELET
BASOS PCT: 1 %
Basophils Absolute: 0 10*3/uL (ref 0–0.1)
Eosinophils Absolute: 0.2 10*3/uL (ref 0–0.7)
Eosinophils Relative: 4 %
HCT: 41.5 % (ref 35.0–47.0)
HEMOGLOBIN: 13.8 g/dL (ref 12.0–16.0)
LYMPHS ABS: 2.6 10*3/uL (ref 1.0–3.6)
Lymphocytes Relative: 44 %
MCH: 30.5 pg (ref 26.0–34.0)
MCHC: 33.3 g/dL (ref 32.0–36.0)
MCV: 91.6 fL (ref 80.0–100.0)
MONOS PCT: 12 %
Monocytes Absolute: 0.7 10*3/uL (ref 0.2–0.9)
NEUTROS ABS: 2.3 10*3/uL (ref 1.4–6.5)
NEUTROS PCT: 39 %
PLATELETS: 231 10*3/uL (ref 150–440)
RBC: 4.53 MIL/uL (ref 3.80–5.20)
RDW: 13.4 % (ref 11.5–14.5)
WBC: 5.8 10*3/uL (ref 3.6–11.0)

## 2015-09-13 MED ORDER — HEPARIN SOD (PORK) LOCK FLUSH 100 UNIT/ML IV SOLN
500.0000 [IU] | Freq: Once | INTRAVENOUS | Status: AC
  Administered 2015-09-13: 500 [IU] via INTRAVENOUS
  Filled 2015-09-13: qty 5

## 2015-09-13 MED ORDER — SODIUM CHLORIDE 0.9 % IJ SOLN
10.0000 mL | INTRAMUSCULAR | Status: DC | PRN
  Administered 2015-09-13: 10 mL via INTRAVENOUS
  Filled 2015-09-13: qty 10

## 2015-09-14 LAB — CA 125: CA 125: 17.8 U/mL (ref 0.0–38.1)

## 2015-09-15 ENCOUNTER — Inpatient Hospital Stay (HOSPITAL_BASED_OUTPATIENT_CLINIC_OR_DEPARTMENT_OTHER): Payer: PPO | Admitting: Oncology

## 2015-09-15 VITALS — BP 127/76 | HR 68 | Temp 97.7°F | Resp 18 | Wt 181.0 lb

## 2015-09-15 DIAGNOSIS — R531 Weakness: Secondary | ICD-10-CM

## 2015-09-15 DIAGNOSIS — G629 Polyneuropathy, unspecified: Secondary | ICD-10-CM

## 2015-09-15 DIAGNOSIS — Z90722 Acquired absence of ovaries, bilateral: Secondary | ICD-10-CM | POA: Diagnosis not present

## 2015-09-15 DIAGNOSIS — Z923 Personal history of irradiation: Secondary | ICD-10-CM

## 2015-09-15 DIAGNOSIS — Z853 Personal history of malignant neoplasm of breast: Secondary | ICD-10-CM | POA: Diagnosis not present

## 2015-09-15 DIAGNOSIS — Z90710 Acquired absence of both cervix and uterus: Secondary | ICD-10-CM

## 2015-09-15 DIAGNOSIS — R5383 Other fatigue: Secondary | ICD-10-CM

## 2015-09-15 DIAGNOSIS — L658 Other specified nonscarring hair loss: Secondary | ICD-10-CM

## 2015-09-15 DIAGNOSIS — Z9223 Personal history of estrogen therapy: Secondary | ICD-10-CM

## 2015-09-15 DIAGNOSIS — Z9221 Personal history of antineoplastic chemotherapy: Secondary | ICD-10-CM

## 2015-09-15 DIAGNOSIS — C569 Malignant neoplasm of unspecified ovary: Secondary | ICD-10-CM

## 2015-09-15 DIAGNOSIS — E039 Hypothyroidism, unspecified: Secondary | ICD-10-CM

## 2015-09-15 DIAGNOSIS — C561 Malignant neoplasm of right ovary: Secondary | ICD-10-CM

## 2015-09-17 ENCOUNTER — Encounter: Payer: Self-pay | Admitting: Oncology

## 2015-09-17 NOTE — Progress Notes (Addendum)
[place] @ [facility] [phone]  Fax:([phone] [place]: 07-24-1941  MR#: [id number]  VVO#:[id number]  Patient Care Team: Jerrol Banana., MD as PCP - General (Family Medicine) [facility] Gaetana Michaelis, MD as Referring Physician (Obstetrics and Gynecology) Robert Bellow, MD as Consulting Physician (General Surgery)  CHIEF COMPLAINT:  Chief Complaint  Patient presents with  . 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 2.   carcinoma  of breast ( left) status post lumpectomy ,adiation  therapyand 5 years of tamoxifen. 3.history of atrial fibrillation being managed by cardiologist 4.  Status post bilateral salpingo-oophorectomy on fourth of May, 2016.  with optimal debulking surgery 5.  Starting chemotherapy with carboplatinum and Taxol in dose dense fashion from Jan 02, 2015 5.  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      Oncology Flowsheet 02/14/2015 02/28/2015 03/07/2015 03/28/2015 04/11/2015 04/18/2015 05/09/2015  Day, Cycle 2, Cycle 2 Day 1, Cycle 3 Day 8, Cycle 3 Day 1, Cycle 4; Day 1, Cycle 1 - Day 1, Cycle 2 Day 1, Cycle 3  CARBOplatin (PARAPLATIN) IV - 450 mg - 450 mg - 450 mg 450 mg  dexamethasone (DECADRON) IJ - - - - - - -  dexamethasone (DECADRON) IV [ 20 mg ] [ 12 mg ] [ 20 mg ] [ 12 mg ] - [ 12 mg ] [ 12 mg ]  enoxaparin (LOVENOX) [place] - - - - 40 mg - -  fosaprepitant (EMEND) IV - [ 150 mg ] - [ 150 mg ] - [ 150 mg ] [ 150 mg ]  LORazepam (ATIVAN) PO - - - - 0.25 mg - -  ondansetron (ZOFRAN) IV [ 8 mg ] - [ 8 mg ] - - - -  PACLitaxel (TAXOL) IV 80 mg/m2 90 mg/m2 90 mg/m2 175 mg/m2 - 150 mg/m2 175 mg/m2  palonosetron (ALOXI) IV - 0.25 mg - 0.25 mg - 0.25 mg 0.25 mg  pegfilgrastim (NEULASTA ONPRO KIT) [place] - - - 6 mg - 6 mg 6 mg  scopolamine 1 MG/3DAYS (TRANSDERM-SCOP) TD - - - - - - -     INTERVAL HISTORY: 75 year old lady with a history of ovarian cancer here to initiate next cycle of chemotherapy.  This is the third cycle of treatment.  Patient has finished total 6 cycles of chemotherapy.  No abdominal pain.  No nausea no vomiting and a repeat CT scan and tumor markers.  No chills.  No fever.  No abdominal pain.  Appetite has been stable.  No tingling.  No numbness.  Patient is here after 6 cycles of chemotherapy.  Gradually recovering.  Abdominal pain has improved.  Alopecia is gradually resolving Neuropathy grade 1.  Patient is here for further evaluation and discussion regarding further surveillance. Patient has been seen by GYN oncologist in October and pelvic examination has been reported to be negative REVIEW OF SYSTEMS:    general status: Patient is feeling weak and tired.  No change in a performance status.  No chills.  No fever. HEENT: Alopecia.  No evidence of stomatitis Lungs: No cough or shortness of breath Cardiac: No chest pain or paroxysmal nocturnal dyspnea GI: No nausea no vomiting no diarrhea no abdominal pain Skin: No rash GU: No dysuria or hematuria Lower extremity no swelling Neurological system: No tingling.  No numbness.  No other focal signs Musculoskeletal system no bony pains  As per HPI. Otherwise, a complete review of systems is negatve.  PAST MEDICAL HISTORY: Past Medical History  Diagnosis Date  . Atrial fibrillation ([place])   . Hyperlipidemia   . PVC (premature ventricular contraction)   . Mitral insufficiency   . GERD (gastroesophageal reflux disease)   . Hypothyroidism   . Cancer of breast ([place]) 1992    Left  . Ovarian cancer [facility])     s/p BSO optimal tumor debulking May 2016    PAST SURGICAL HISTORY: Past Surgical History  Procedure Laterality Date  . Cesarean section      x2  . Abdominal hysterectomy    . Cholecystectomy    . Colonoscopy  2006  . Breast surgery      Left  . Laparotomy with staging N/A 12/22/2014     Procedure: LAPAROTOMY WITH STAGING;  Surgeon: Gillis Ends, MD;  Location: [facility] ORS;  Service: Gynecology;  Laterality: N/A;  . Salpingoophorectomy Bilateral 12/22/2014    Procedure: SALPINGO OOPHORECTOMY;  Surgeon: Gillis Ends, MD;  Location: [facility] ORS;  Service: Gynecology;  Laterality: Bilateral;  . Debulking N/A 12/22/2014    Procedure: DEBULKING;  Surgeon: Gillis Ends, MD;  Location: [facility] ORS;  Service: Gynecology;  Laterality: N/A;  . Laparotomy N/A 12/22/2014    Procedure: EXPLORATORY LAPAROTOMY;  Surgeon: Robert Bellow, MD;  Location: [facility] ORS;  Service: General;  Laterality: N/A;  . Portacath placement Right 12/22/2014    Procedure: INSERTION PORT-A-CATH;  Surgeon: Robert Bellow, MD;  Location: [facility] ORS;  Service: General;  Laterality: Right;  . Peripheral vascular catheterization Left 12/22/2014    Procedure: PORTA CATH INSERTION;  Surgeon: Robert Bellow, MD;  Location: [facility] ORS;  Service: General;  Laterality: Left;  . Bilateral salpingoophorectomy  May 2016    with optimal tumor debulking     FAMILY HISTORY Family History  Problem Relation Age of Onset  . Cancer Mother     breast, throat, and stomach  . Breast cancer Mother   . Heart disease Father   . Pulmonary embolism Sister   . Pulmonary embolism Other   . Cancer Other   . Cancer Brother 60    angiosarcoma of the chest; carcinoid small intestinal tumor  . Cancer Maternal Aunt     breast cancer  . Cancer Maternal Grandfather 55    pancreatic    ADVANCED DIRECTIVES:  Patient does have advance healthcare directive, Patient   does not desire to make any changes  HEALTH MAINTENANCE: Social History  Substance Use Topics  . Smoking status: Never Smoker   . Smokeless tobacco: Never Used  . Alcohol Use: No      Allergies  Allergen Reactions  . Carafate [Sucralfate] Rash  . Sulfa Antibiotics Rash    Current Outpatient Prescriptions  Medication Sig Dispense Refill  .  acetaminophen (TYLENOL) 500 MG chewable tablet Chew 500 mg by mouth every 6 (six) hours as needed for pain.    Marland Kitchen docusate sodium (COLACE) 50 MG capsule Take 50 mg by mouth daily as needed.     Marland Kitchen levothyroxine (SYNTHROID, LEVOTHROID) 50 MCG tablet TAKE 1 TABLET EVERY DAY 30 tablet 12  . lidocaine-prilocaine (EMLA) cream APPLY TO AFFECTED AREA IF NEEDED 30 g 1  . LORazepam (ATIVAN) 0.5 MG tablet Take 1 tablet (0.5 mg total) by mouth at bedtime. 30 tablet 3  . ondansetron (ZOFRAN) 4 MG tablet Take 1 tablet (4 mg total)  by mouth every 6 (six) hours as needed for nausea or vomiting. 30 tablet 3  . pantoprazole (PROTONIX) 40 MG tablet TAKE 1 TABLET EVERY DAY 30 tablet 12  . polyethylene glycol (MIRALAX / GLYCOLAX) packet Take 17 g by mouth daily.    . propafenone (RYTHMOL) 225 MG tablet Take 225 mg by mouth 2 (two) times daily.     No current facility-administered medications for this visit.   Facility-Administered Medications Ordered in Other Visits  Medication Dose Route Frequency Provider Last Rate Last Dose  . sodium chloride 0.9 % injection 10 mL  10 mL Intracatheter PRN Forest Gleason, MD   10 mL at 02/07/15 0930    OBJECTIVE:  Filed Vitals:   09/15/15 1414  BP: 127/76  Pulse: 68  Temp: 97.7 F (36.5 C)  Resp: 18     Body mass index is 31.05 kg/(m^2).    ECOG FS:1 - Symptomatic but completely ambulatory  PHYSICAL EXAM: GENERAL:  Well developed, well nourished, sitting comfortably in the exam room in no acute distress. MENTAL STATUS:  Alert and oriented to person, place and time. HEAD:   ALOPECIA  Normocephalic, atraumatic, face symmetric, no Cushingoid features. EYES:  Pupils equal round and reactive to light and accomodation.  No conjunctivitis or scleral icterus. ENT:  Oropharynx clear without lesion.  Tongue normal. Mucous membranes moist.  RESPIRATORY:  Clear to auscultation without rales, wheezes or rhonchi. CARDIOVASCULAR:  Regular rate and rhythm without murmur, rub or  gallop. BREAST:  Right breast without masses, skin changes or nipple discharge.  Left breast without masses, skin changes or nipple discharge. ABDOMEN:  Soft, non-tender, with active bowel sounds, and no hepatosplenomegaly.  No masses. BACK:  No CVA tenderness.  No tenderness on percussion of the back or rib cage. SKIN:  No rashes, ulcers or lesions. EXTREMITIES: No edema, no skin discoloration or tenderness.  No palpable cords. LYMPH NODES: No palpable cervical, supraclavicular, axillary or inguinal adenopathy  NEUROLOGICAL: Unremarkable. PSYCH:  Appropriate.   LAB RESULTS:  No visits with results within 2 Day(s) from this visit. Latest known visit with results is:  Infusion on 09/13/2015  Component Date Value Ref Range Status  . WBC 09/13/2015 5.8  3.6 - 11.0 K/uL Final  . RBC 09/13/2015 4.53  3.80 - 5.20 MIL/uL Final  . Hemoglobin 09/13/2015 13.8  12.0 - 16.0 g/dL Final  . HCT 09/13/2015 41.5  35.0 - 47.0 % Final  . MCV 09/13/2015 91.6  80.0 - 100.0 fL Final  . MCH 09/13/2015 30.5  26.0 - 34.0 pg Final  . MCHC 09/13/2015 33.3  32.0 - 36.0 g/dL Final  . RDW 09/13/2015 13.4  11.5 - 14.5 % Final  . Platelets 09/13/2015 231  150 - 440 K/uL Final  . Neutrophils Relative % 09/13/2015 39   Final  . Neutro Abs 09/13/2015 2.3  1.4 - 6.5 K/uL Final  . Lymphocytes Relative 09/13/2015 44   Final  . Lymphs Abs 09/13/2015 2.6  1.0 - 3.6 K/uL Final  . Monocytes Relative 09/13/2015 12   Final  . Monocytes Absolute 09/13/2015 0.7  0.2 - 0.9 K/uL Final  . Eosinophils Relative 09/13/2015 4   Final  . Eosinophils Absolute 09/13/2015 0.2  0 - 0.7 K/uL Final  . Basophils Relative 09/13/2015 1   Final  . Basophils Absolute 09/13/2015 0.0  0 - 0.1 K/uL Final  . Sodium 09/13/2015 137  135 - 145 mmol/L Final  . Potassium 09/13/2015 3.7  3.5 - 5.1 mmol/L Final  .  Chloride 09/13/2015 105  101 - 111 mmol/L Final  . CO2 09/13/2015 25  22 - 32 mmol/L Final  . Glucose, Bld 09/13/2015 105* 65 - 99 mg/dL  Final  . BUN 09/13/2015 13  6 - 20 mg/dL Final  . Creatinine, Ser 09/13/2015 0.84  0.44 - 1.00 mg/dL Final  . Calcium 09/13/2015 9.0  8.9 - 10.3 mg/dL Final  . Total Protein 09/13/2015 6.8  6.5 - 8.1 g/dL Final  . Albumin 09/13/2015 3.4* 3.5 - 5.0 g/dL Final  . AST 09/13/2015 15  15 - 41 U/L Final  . ALT 09/13/2015 14  14 - 54 U/L Final  . Alkaline Phosphatase 09/13/2015 75  38 - 126 U/L Final  . Total Bilirubin 09/13/2015 0.5  0.3 - 1.2 mg/dL Final  . GFR calc non Af Amer 09/13/2015 >60  >60 mL/min Final  . GFR calc Af Amer 09/13/2015 >60  >60 mL/min Final   Comment: (NOTE) The eGFR has been calculated using the CKD EPI equation. This calculation has not been validated in all clinical situations. eGFR's persistently <60 mL/min signify possible Chronic Kidney Disease.   . Anion gap 09/13/2015 7  5 - 15 Final  . CA 125 09/13/2015 17.8  0.0 - 38.1 U/mL Final   Comment: (NOTE) Roche ECLIA methodology Performed At: [facility] [address] [place], [place] [id number] Lindon Romp MD YQ:[id number]     CA 125 (June 05, 2015) 14.9 ASSESSMENT: OVARIAN  cancer stage III He should not has finished total 6 cycles of chemotherapy  Patient does have ATM genetic mutation. As recommended patient's cancer risk for breast cancer is 17% to 52% Currently there are no specific recommendation except for routine mammogram breast self-examination. Increased chance for pancreatic cancer and there is no specific recommendation. There are no specific recommendation for the family other than routine evaluation. This has been discussed with the patient and family. 2.  CT scan has been reviewed independently there is no evidence of recurrent disease CA 125 is17.8. All other lab data has been reviewed there are within acceptable range We discussed possibility of maintenance therapy after discussing with Dr. Theora Gianotti Continue follow-up without any intervention  On clinical examination  there is no evidence of recurrent disease   2.Neuropathy persists.  If it is bothering her too much then Neurontin would be advised.   MEDICAL DECISION MAKING:  There is no clinical evidence of recurrent disease. Patient will be evaluated by GYN oncologist with repeat tumor markers and CT scan in 3 months Again we discussed GOG study patient had number of questions regarding that and finally agreed to participate in the study Situation was discussed with the research coordinator./Total duration of visit was 30mnutes.  50% or more time was spent in counseling patient and family regarding prognosis and options of treatment and available resources and GOG study    Staging form: Ovary, AJCC 7th Edition     Clinical: Stage IIIC (T3c, N0, M0) - UMarni Griffon MD   09/17/2015 8:24 AM

## 2015-09-28 ENCOUNTER — Encounter: Payer: Self-pay | Admitting: *Deleted

## 2015-09-28 ENCOUNTER — Other Ambulatory Visit: Payer: Self-pay | Admitting: *Deleted

## 2015-09-28 ENCOUNTER — Inpatient Hospital Stay: Payer: PPO | Attending: Oncology

## 2015-09-28 DIAGNOSIS — C569 Malignant neoplasm of unspecified ovary: Secondary | ICD-10-CM

## 2015-09-28 LAB — CBC WITH DIFFERENTIAL/PLATELET
Basophils Absolute: 0 10*3/uL (ref 0–0.1)
Basophils Relative: 1 %
EOS PCT: 3 %
Eosinophils Absolute: 0.1 10*3/uL (ref 0–0.7)
HCT: 42.1 % (ref 35.0–47.0)
HEMOGLOBIN: 14.4 g/dL (ref 12.0–16.0)
LYMPHS ABS: 2.4 10*3/uL (ref 1.0–3.6)
LYMPHS PCT: 49 %
MCH: 31 pg (ref 26.0–34.0)
MCHC: 34.2 g/dL (ref 32.0–36.0)
MCV: 90.7 fL (ref 80.0–100.0)
Monocytes Absolute: 0.5 10*3/uL (ref 0.2–0.9)
Monocytes Relative: 10 %
NEUTROS PCT: 37 %
Neutro Abs: 1.8 10*3/uL (ref 1.4–6.5)
Platelets: 221 10*3/uL (ref 150–440)
RBC: 4.64 MIL/uL (ref 3.80–5.20)
RDW: 13.7 % (ref 11.5–14.5)
WBC: 4.9 10*3/uL (ref 3.6–11.0)

## 2015-09-29 ENCOUNTER — Inpatient Hospital Stay: Payer: PPO

## 2015-09-29 DIAGNOSIS — C569 Malignant neoplasm of unspecified ovary: Secondary | ICD-10-CM

## 2015-10-09 IMAGING — US US SOFT TISSUE HEAD/NECK
1 series · 14 of 25 positions shown · non-contrast
Comparison: None.

CLINICAL DATA: Hypothyroidism.

EXAM:
THYROID ULTRASOUND
TECHNIQUE: Ultrasound examination of the thyroid gland and adjacent soft
tissues was performed.

[Series 1: us soft tissue head/neck · 0.09mm/px · 14 of 52 slices shown]
[im 1/52]
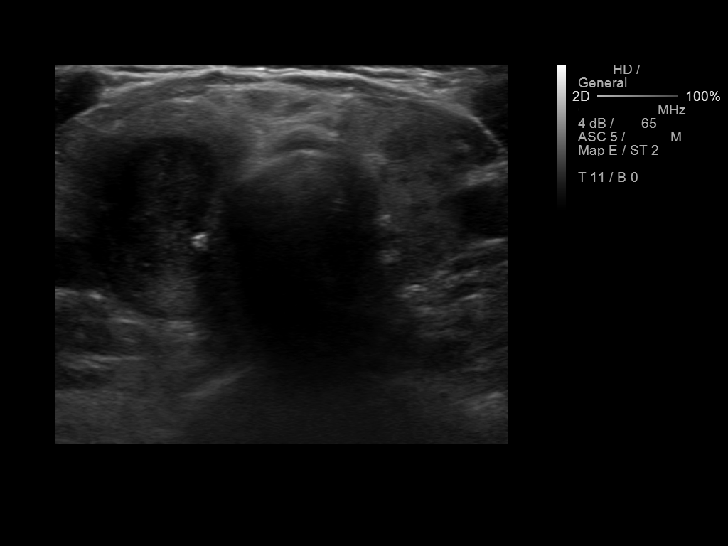
[im 5/52]
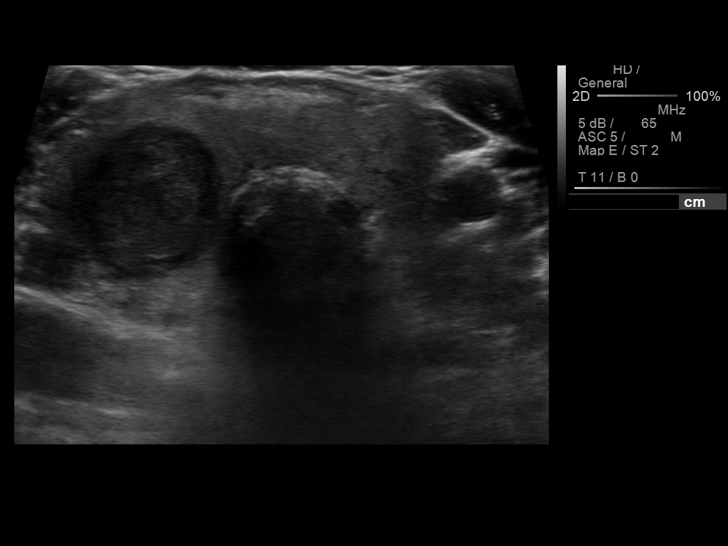
[im 9/52]
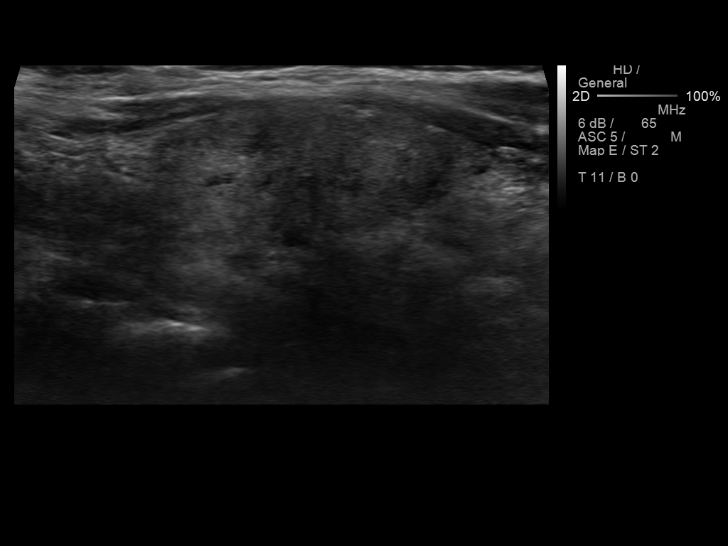
[im 13/52]
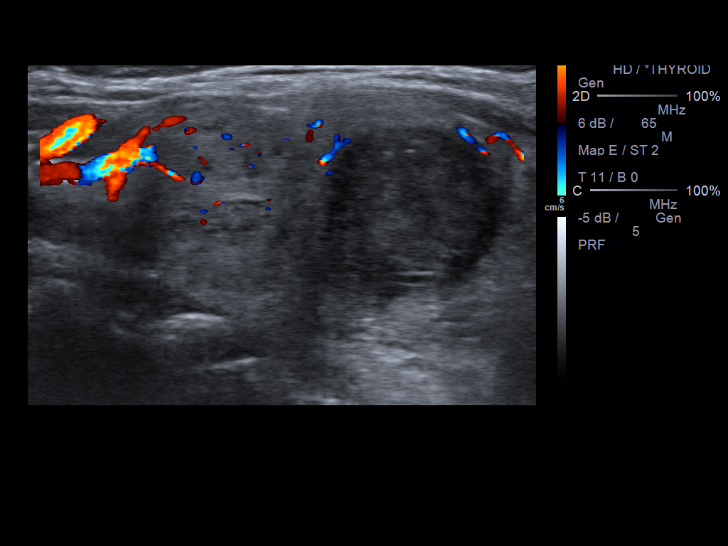
[im 18/52]
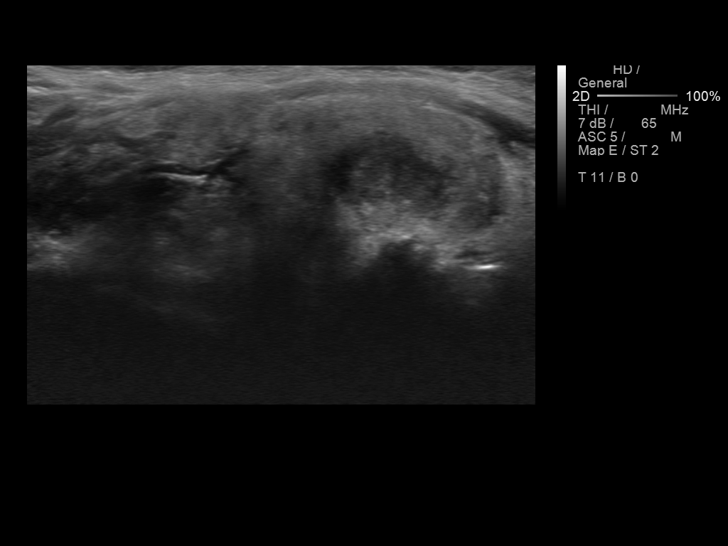
[im 20/52]
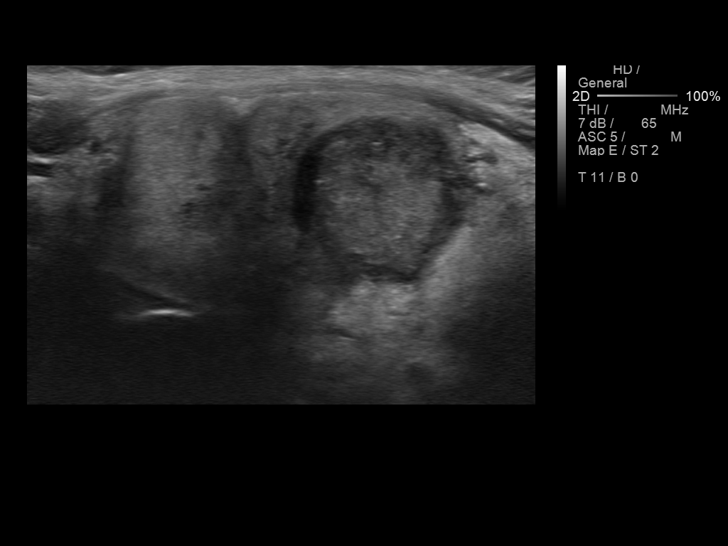
[im 24/52]
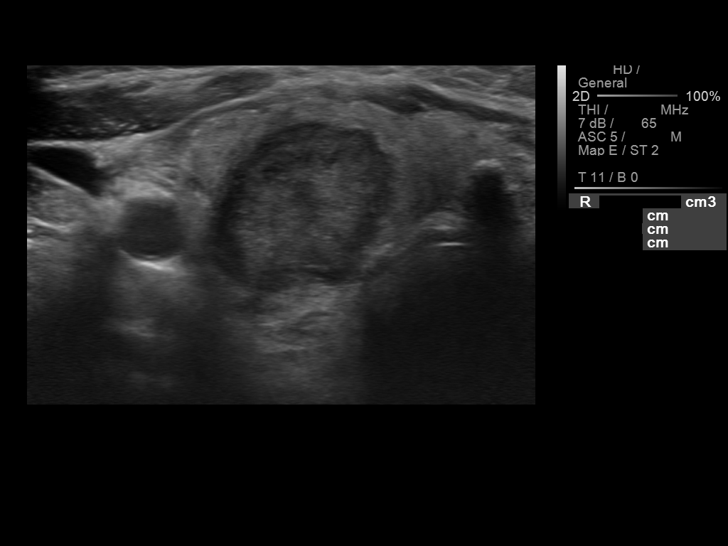
[im 28/52]
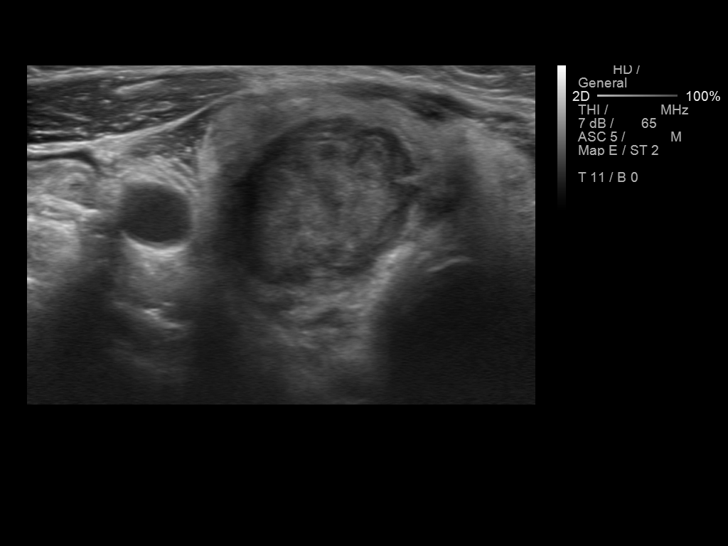
[im 32/52]
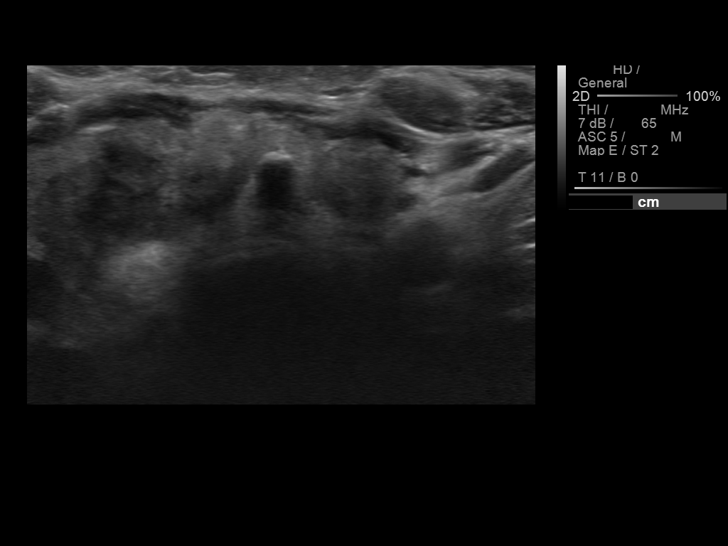
[im 35/52]
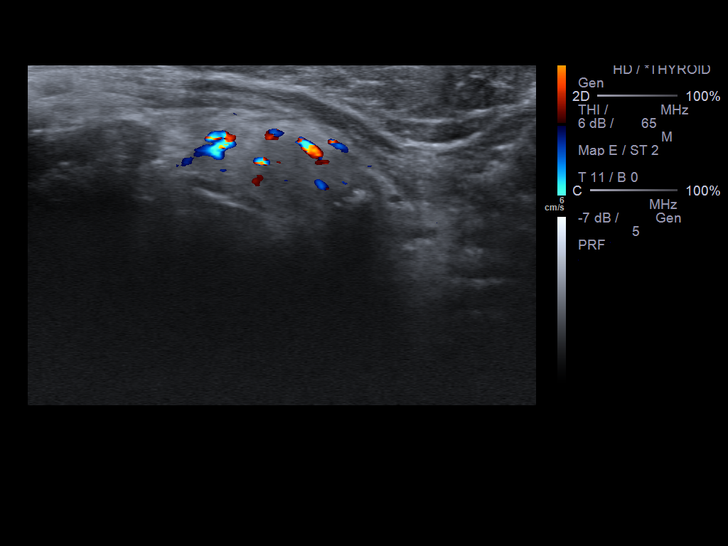
[im 39/52]
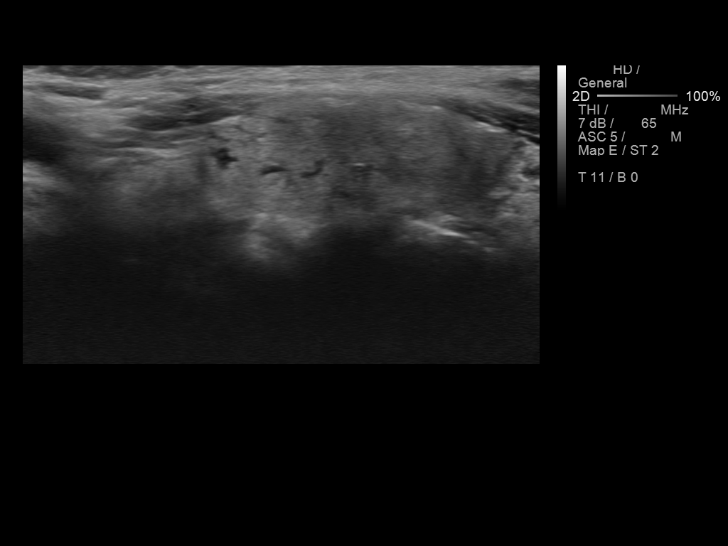
[im 43/52]
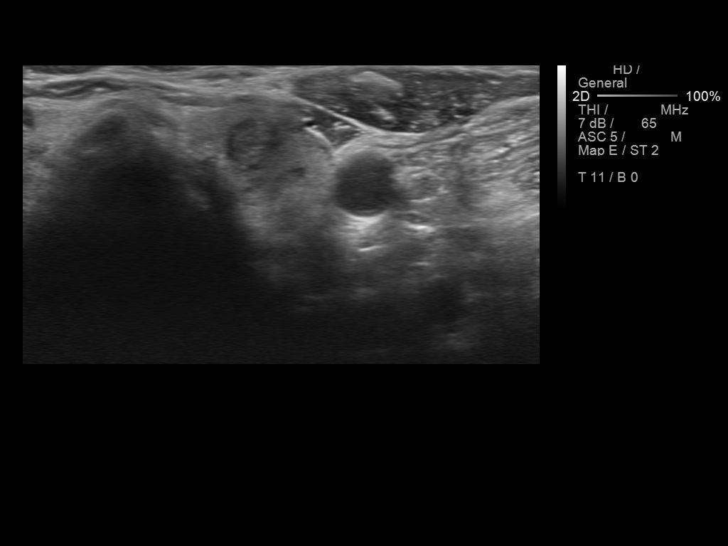
[im 47/52]
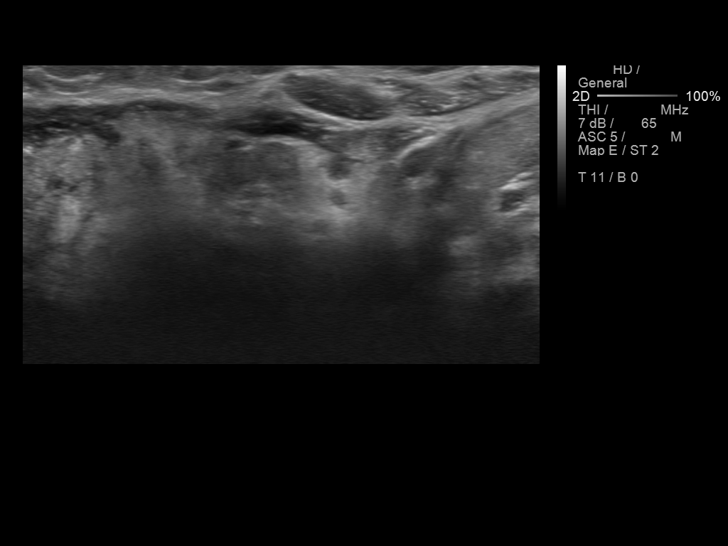
[im 52/52]
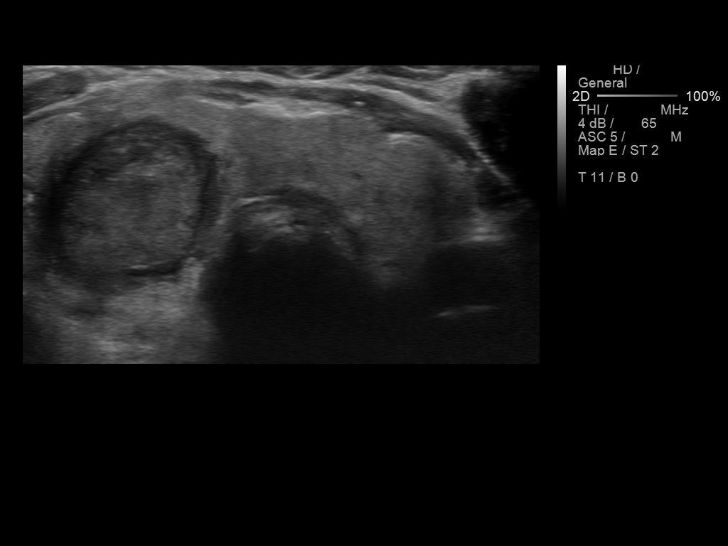

[14 of 25 positions shown; findings below may reference images not displayed]

FINDINGS: Right thyroid lobe

Measurements: Enlarged measuring 6.1 x 2.8 x 2.7 cm.

Right, mid/inferior - 2.0 x 1.9 x 1.9 cm - mixed echogenic, solid

Left thyroid lobe

Measurements: Normal in size measuring 4.1 x 1.6 x 1.3 cm.

There is diffuse heterogeneity the left lobe of the thyroid without
discrete nodule or mass.

Isthmus

Thickness: Enlarged measures 0.9 cm in diameter.

Mid - 0.4 cm - echogenic, shadowing, dystrophic calcification

Lymphadenopathy

None visualized.
IMPRESSION: Findings suggestive of multi nodular goiter. The dominant
approximately 2.0 cm nodule within the mid/inferior aspect the right
lobe of the thyroid meets imaging criteria to recommend percutaneous
sampling as clinically indicated. This recommendation follows the
consensus statement: Management of Thyroid Nodules Detected at US:
Society of Radiologists in Ultrasound Consensus Conference

## 2015-10-14 NOTE — Progress Notes (Signed)
Encounter opened erroneously on 12/12/2015 Mirian Mo, RN, BSN 10/14/2015 10:31 AM

## 2015-10-25 ENCOUNTER — Inpatient Hospital Stay: Payer: PPO | Attending: Oncology

## 2015-10-25 DIAGNOSIS — Z9221 Personal history of antineoplastic chemotherapy: Secondary | ICD-10-CM | POA: Diagnosis not present

## 2015-10-25 DIAGNOSIS — Z90722 Acquired absence of ovaries, bilateral: Secondary | ICD-10-CM | POA: Insufficient documentation

## 2015-10-25 DIAGNOSIS — Z923 Personal history of irradiation: Secondary | ICD-10-CM | POA: Diagnosis not present

## 2015-10-25 DIAGNOSIS — Z90710 Acquired absence of both cervix and uterus: Secondary | ICD-10-CM | POA: Diagnosis not present

## 2015-10-25 DIAGNOSIS — Z452 Encounter for adjustment and management of vascular access device: Secondary | ICD-10-CM | POA: Diagnosis not present

## 2015-10-25 DIAGNOSIS — C561 Malignant neoplasm of right ovary: Secondary | ICD-10-CM | POA: Insufficient documentation

## 2015-10-25 DIAGNOSIS — Z853 Personal history of malignant neoplasm of breast: Secondary | ICD-10-CM | POA: Diagnosis not present

## 2015-10-25 DIAGNOSIS — C569 Malignant neoplasm of unspecified ovary: Secondary | ICD-10-CM

## 2015-10-25 DIAGNOSIS — Z9223 Personal history of estrogen therapy: Secondary | ICD-10-CM | POA: Diagnosis not present

## 2015-10-25 MED ORDER — SODIUM CHLORIDE 0.9% FLUSH
10.0000 mL | Freq: Once | INTRAVENOUS | Status: AC
  Administered 2015-10-25: 10 mL via INTRAVENOUS
  Filled 2015-10-25: qty 10

## 2015-10-25 MED ORDER — HEPARIN SOD (PORK) LOCK FLUSH 100 UNIT/ML IV SOLN
INTRAVENOUS | Status: AC
  Filled 2015-10-25: qty 5

## 2015-10-25 MED ORDER — HEPARIN SOD (PORK) LOCK FLUSH 100 UNIT/ML IV SOLN
500.0000 [IU] | Freq: Once | INTRAVENOUS | Status: AC
  Administered 2015-10-25: 500 [IU] via INTRAVENOUS

## 2015-10-26 DIAGNOSIS — I493 Ventricular premature depolarization: Secondary | ICD-10-CM | POA: Diagnosis not present

## 2015-10-26 DIAGNOSIS — E782 Mixed hyperlipidemia: Secondary | ICD-10-CM | POA: Diagnosis not present

## 2015-10-26 DIAGNOSIS — R002 Palpitations: Secondary | ICD-10-CM | POA: Diagnosis not present

## 2015-10-26 DIAGNOSIS — I10 Essential (primary) hypertension: Secondary | ICD-10-CM | POA: Diagnosis not present

## 2015-10-26 DIAGNOSIS — I340 Nonrheumatic mitral (valve) insufficiency: Secondary | ICD-10-CM | POA: Diagnosis not present

## 2015-10-26 DIAGNOSIS — I480 Paroxysmal atrial fibrillation: Secondary | ICD-10-CM | POA: Diagnosis not present

## 2015-12-12 DIAGNOSIS — K648 Other hemorrhoids: Secondary | ICD-10-CM

## 2015-12-12 DIAGNOSIS — F419 Anxiety disorder, unspecified: Secondary | ICD-10-CM | POA: Insufficient documentation

## 2015-12-12 DIAGNOSIS — M8580 Other specified disorders of bone density and structure, unspecified site: Secondary | ICD-10-CM

## 2015-12-12 DIAGNOSIS — E785 Hyperlipidemia, unspecified: Secondary | ICD-10-CM

## 2015-12-12 DIAGNOSIS — J309 Allergic rhinitis, unspecified: Secondary | ICD-10-CM | POA: Insufficient documentation

## 2015-12-12 DIAGNOSIS — K279 Peptic ulcer, site unspecified, unspecified as acute or chronic, without hemorrhage or perforation: Secondary | ICD-10-CM | POA: Insufficient documentation

## 2015-12-12 DIAGNOSIS — J3089 Other allergic rhinitis: Secondary | ICD-10-CM

## 2015-12-12 DIAGNOSIS — K219 Gastro-esophageal reflux disease without esophagitis: Secondary | ICD-10-CM | POA: Insufficient documentation

## 2015-12-12 DIAGNOSIS — M1990 Unspecified osteoarthritis, unspecified site: Secondary | ICD-10-CM | POA: Insufficient documentation

## 2015-12-12 DIAGNOSIS — M5137 Other intervertebral disc degeneration, lumbosacral region: Secondary | ICD-10-CM

## 2015-12-12 DIAGNOSIS — G4700 Insomnia, unspecified: Secondary | ICD-10-CM | POA: Insufficient documentation

## 2015-12-13 ENCOUNTER — Encounter: Payer: Self-pay | Admitting: Family Medicine

## 2015-12-13 ENCOUNTER — Ambulatory Visit (INDEPENDENT_AMBULATORY_CARE_PROVIDER_SITE_OTHER): Payer: PPO | Admitting: Family Medicine

## 2015-12-13 VITALS — BP 102/62 | HR 82 | Temp 98.1°F | Resp 14 | Ht 63.75 in | Wt 181.0 lb

## 2015-12-13 DIAGNOSIS — Z0000 Encounter for general adult medical examination without abnormal findings: Secondary | ICD-10-CM

## 2015-12-13 DIAGNOSIS — C569 Malignant neoplasm of unspecified ovary: Secondary | ICD-10-CM | POA: Diagnosis not present

## 2015-12-13 DIAGNOSIS — C50919 Malignant neoplasm of unspecified site of unspecified female breast: Secondary | ICD-10-CM | POA: Diagnosis not present

## 2015-12-13 DIAGNOSIS — E785 Hyperlipidemia, unspecified: Secondary | ICD-10-CM | POA: Diagnosis not present

## 2015-12-13 DIAGNOSIS — I10 Essential (primary) hypertension: Secondary | ICD-10-CM

## 2015-12-13 DIAGNOSIS — Z1211 Encounter for screening for malignant neoplasm of colon: Secondary | ICD-10-CM | POA: Diagnosis not present

## 2015-12-13 DIAGNOSIS — L309 Dermatitis, unspecified: Secondary | ICD-10-CM | POA: Diagnosis not present

## 2015-12-13 DIAGNOSIS — E039 Hypothyroidism, unspecified: Secondary | ICD-10-CM

## 2015-12-13 DIAGNOSIS — I480 Paroxysmal atrial fibrillation: Secondary | ICD-10-CM | POA: Diagnosis not present

## 2015-12-13 LAB — IFOBT (OCCULT BLOOD): IFOBT: NEGATIVE

## 2015-12-13 MED ORDER — MOMETASONE FUROATE 0.1 % EX CREA: 1.0000 "application " | TOPICAL_CREAM | Freq: Every day | CUTANEOUS | Status: DC

## 2015-12-13 NOTE — Progress Notes (Addendum)
Patient ID: Jamie Conner, female   DOB: [birth year], 75 y.o.   MRN: [id number] Visit Date: 12/13/2015  Today's Provider: Wilhemena Durie, MD   Chief Complaint  Patient presents with  . Medicare Wellness  . Hypertension  . Hyperlipidemia  . Ovarian Cancer   Subjective:   Jamie Conner is a 75 y.o. female who presents today for her Subsequent Annual Wellness Visit. She feels fairly well. She reports exercising daily and is in a ovarian cancer study and has to track how many steps she takes a day. She reports she is sleeping poorly.  Mammogram- 06/06/15 BMD- 12/14/09 Some osteopenia Colonoscopy- 09/25/04 repeat 09/2014 Pap- 01/03/11  Immunization History  Administered Date(s) Administered  . Influenza,inj,Quad PF,36+ Mos 05/09/2015  . Pneumococcal Conjugate-13 03/16/2014  . Pneumococcal Polysaccharide-23 02/20/2012  . Td 12/31/2003  . Tdap 04/18/2011      Review of Systems  Constitutional: Negative.   HENT: Negative.   Eyes: Negative.   Respiratory: Negative.   Cardiovascular: Negative.   Gastrointestinal: Positive for constipation.  Endocrine: Negative.   Genitourinary: Negative.   Musculoskeletal: Negative.   Skin: Negative.   Allergic/Immunologic: Negative.   Neurological: Positive for numbness.  Hematological: Negative.   Psychiatric/Behavioral: Positive for decreased concentration.    Patient Active Problem List   Diagnosis Date Noted  . Breast cancer in female [facility]) 12/13/2015  . Peptic ulcer disease 12/12/2015  . DDD (degenerative disc disease), lumbosacral 12/12/2015  . Hyperlipidemia 12/12/2015  . Acute anxiety 12/12/2015  . Insomnia 12/12/2015  . Allergic rhinitis 12/12/2015  . GERD (gastroesophageal reflux disease) 12/12/2015  . Internal hemorrhoid 12/12/2015  . OA (osteoarthritis) 12/12/2015  . Osteopenia 12/12/2015  . Hypothyroid 04/11/2015  . Benign essential HTN 02/01/2015  . Retroperitoneal fibrosis   . Combined fat and carbohydrate  induced hyperlipemia 01/04/2015  . Awareness of heartbeats 01/04/2015  . Beat, premature ventricular 01/04/2015  . Ovarian cancer ([place]) 12/16/2014  . MI (mitral incompetence) 09/02/2014  . AF (paroxysmal atrial fibrillation) ([place]) 09/02/2014    Social History   Social History  . Marital Status: Married    Spouse Name: N/A  . Number of Children: N/A  . Years of Education: N/A   Occupational History  . Not on file.   Social History Main Topics  . Smoking status: Never Smoker   . Smokeless tobacco: Never Used  . Alcohol Use: No  . Drug Use: No  . Sexual Activity: Not Currently   Other Topics Concern  . Not on file   Social History Narrative    Past Surgical History  Procedure Laterality Date  . Cesarean section      x2  . Abdominal hysterectomy    . Cholecystectomy    . Colonoscopy  2006  . Breast surgery      Left  . Laparotomy with staging N/A 12/22/2014    Procedure: LAPAROTOMY WITH STAGING;  Surgeon: Gillis Ends, MD;  Location: [facility] ORS;  Service: Gynecology;  Laterality: N/A;  . Salpingoophorectomy Bilateral 12/22/2014    Procedure: SALPINGO OOPHORECTOMY;  Surgeon: Gillis Ends, MD;  Location: [facility] ORS;  Service: Gynecology;  Laterality: Bilateral;  . Debulking N/A 12/22/2014    Procedure: DEBULKING;  Surgeon: Gillis Ends, MD;  Location: [facility] ORS;  Service: Gynecology;  Laterality: N/A;  . Laparotomy N/A 12/22/2014    Procedure: EXPLORATORY LAPAROTOMY;  Surgeon: Robert Bellow, MD;  Location: [facility] ORS;  Service: General;  Laterality: N/A;  . Portacath placement Right 12/22/2014  Procedure: INSERTION PORT-A-CATH;  Surgeon: Robert Bellow, MD;  Location: [facility] ORS;  Service: General;  Laterality: Right;  . Peripheral vascular catheterization Left 12/22/2014    Procedure: PORTA CATH INSERTION;  Surgeon: Robert Bellow, MD;  Location: [facility] ORS;  Service: General;  Laterality: Left;  . Bilateral salpingoophorectomy  May 2016    with  optimal tumor debulking   . Upper gi endoscopy  09/25/04    hiatus hernia, schatzki ring and a single gastric polyp    Her family history includes Breast cancer in her mother; Cancer in her maternal aunt, mother, and other; Cancer (age of onset: 83) in her brother; Cancer (age of onset: 39) in her maternal grandfather; Heart disease in her father; Hypertension in her brother; Pulmonary embolism in her other and sister.    Outpatient Prescriptions Prior to Visit  Medication Sig Dispense Refill  . acetaminophen (TYLENOL) 500 MG chewable tablet Chew 500 mg by mouth every 6 (six) hours as needed for pain.    Marland Kitchen levothyroxine (SYNTHROID, LEVOTHROID) 50 MCG tablet TAKE 1 TABLET EVERY DAY 30 tablet 12  . lidocaine-prilocaine (EMLA) cream APPLY TO AFFECTED AREA IF NEEDED 30 g 1  . LORazepam (ATIVAN) 0.5 MG tablet Take 1 tablet (0.5 mg total) by mouth at bedtime. 30 tablet 3  . pantoprazole (PROTONIX) 40 MG tablet TAKE 1 TABLET EVERY DAY 30 tablet 12  . polyethylene glycol (MIRALAX / GLYCOLAX) packet Take 17 g by mouth daily.    . propafenone (RYTHMOL) 225 MG tablet Take 225 mg by mouth 2 (two) times daily.    Marland Kitchen docusate sodium (COLACE) 50 MG capsule Take 50 mg by mouth daily as needed. Reported on 12/13/2015    . ondansetron (ZOFRAN) 4 MG tablet Take 1 tablet (4 mg total) by mouth every 6 (six) hours as needed for nausea or vomiting. (Patient not taking: Reported on 12/13/2015) 30 tablet 3   Facility-Administered Medications Prior to Visit  Medication Dose Route Frequency Provider Last Rate Last Dose  . sodium chloride 0.9 % injection 10 mL  10 mL Intracatheter PRN Forest Gleason, MD   10 mL at 02/07/15 0930    Allergies  Allergen Reactions  . Carafate [Sucralfate] Rash  . Sulfa Antibiotics Rash    Patient Care Team: Jerrol Banana., MD as PCP - General (Family Medicine) [facility] Gaetana Michaelis, MD as Referring Physician (Obstetrics and Gynecology) Robert Bellow, MD as Consulting Physician  (General Surgery)  Objective:   Vitals:  Filed Vitals:   12/13/15 0844  BP: 102/62  Pulse: 82  Temp: 98.1 F (36.7 C)  TempSrc: Oral  Resp: 14  Height: 5' 3.75" (1.619 m)  Weight: 181 lb (82.101 kg)    Physical Exam  Constitutional: She is oriented to person, place, and time. She appears well-developed and well-nourished.  HENT:  Head: Normocephalic and atraumatic.  Right Ear: External ear normal.  Left Ear: External ear normal.  Mouth/Throat: Oropharynx is clear and moist.  Eyes: Conjunctivae and EOM are normal. Pupils are equal, round, and reactive to light.  Neck: Normal range of motion. Neck supple.  Cardiovascular: Normal rate, regular rhythm, normal heart sounds and intact distal pulses.   Pulmonary/Chest: Effort normal and breath sounds normal.  Abdominal: Soft. Bowel sounds are normal.  Musculoskeletal: Normal range of motion.  Neurological: She is alert and oriented to person, place, and time. She has normal reflexes.  Skin: Skin is warm and dry.  Psychiatric: She has a normal mood and affect.  Her behavior is normal. Judgment and thought content normal.  Ovarian cancer followed by oncology.  Activities of Daily Living In your present state of health, do you have any difficulty performing the following activities: 12/13/2015 04/11/2015  Hearing? N N  Vision? N N  Difficulty concentrating or making decisions? N N  Walking or climbing stairs? N N  Dressing or bathing? N N  Doing errands, shopping? N N    Fall Risk Assessment Fall Risk  12/13/2015 12/13/2015  Falls in the past year? No No     Depression Screen PHQ 2/9 Scores 12/13/2015 12/13/2015  PHQ - 2 Score 0 0    Cognitive Testing - 6-CIT    Year: 0 points  Month: 0 points  Memorize "Pia Mau, [address] [place]"  Time (within 1 hour:) 0 points  Count backwards from 20: 0 points  Name months of year: 0 points  Repeat Address: 6 points   Total Score: 6/28  Interpretation : Normal (0-7)  Abnormal (8-28)    Assessment & Plan:     Annual Wellness Visit  Reviewed patient's Family Medical History Reviewed and updated list of patient's medical providers Assessment of cognitive impairment was done Assessed patient's functional ability Established a written schedule for health screening [place] Completed and Reviewed  Exercise Activities and Dietary recommendations Goals    None      Immunization History  Administered Date(s) Administered  . Influenza,inj,Quad PF,36+ Mos 05/09/2015  . Pneumococcal Conjugate-13 03/16/2014  . Pneumococcal Polysaccharide-23 02/20/2012  . Td 12/31/2003  . Tdap 04/18/2011    Health Maintenance  Topic Date Due  . ZOSTAVAX  06/21/2003  . COLONOSCOPY  09/25/2014  . INFLUENZA VACCINE  03/20/2016  . MAMMOGRAM  06/05/2017  . TETANUS/TDAP  04/17/2021  . DEXA SCAN  Completed  . PNA vac Low Risk Adult  Completed      Discussed health benefits of physical activity, and encouraged her to engage in regular exercise appropriate for her age and condition.   1. Medicare annual wellness visit, subsequent   2. Benign essential HTN   3. Ovarian cancer, unspecified laterality [facility]) Ongoing treatment  October 2016 CT was negative but CA125 was up some. 4. Hyperlipidemia  - Lipid Panel With LDL/HDL Ratio  5. Malignant neoplasm of female breast, unspecified laterality, unspecified site of breast ([place]) 1998.  6. AF (paroxysmal atrial fibrillation) ([place])   7. Hypothyroidism, unspecified hypothyroidism type  - TSH  8. Colon cancer screening  - IFOBT POC (occult bld, rslt in office)  I have done the exam and reviewed the above chart and it is accurate to the best of my knowledge.  Jamie Aschoff MD [facility] 12/13/2015 9:14 AM  ------------------------------------------------------------------------------------------------------------

## 2015-12-14 ENCOUNTER — Inpatient Hospital Stay: Payer: PPO | Attending: Oncology

## 2015-12-14 ENCOUNTER — Inpatient Hospital Stay: Payer: PPO

## 2015-12-14 ENCOUNTER — Ambulatory Visit: Payer: PPO

## 2015-12-14 DIAGNOSIS — Z90710 Acquired absence of both cervix and uterus: Secondary | ICD-10-CM | POA: Diagnosis not present

## 2015-12-14 DIAGNOSIS — Z90722 Acquired absence of ovaries, bilateral: Secondary | ICD-10-CM | POA: Diagnosis not present

## 2015-12-14 DIAGNOSIS — Z9221 Personal history of antineoplastic chemotherapy: Secondary | ICD-10-CM | POA: Insufficient documentation

## 2015-12-14 DIAGNOSIS — Z923 Personal history of irradiation: Secondary | ICD-10-CM | POA: Diagnosis not present

## 2015-12-14 DIAGNOSIS — C561 Malignant neoplasm of right ovary: Secondary | ICD-10-CM | POA: Diagnosis not present

## 2015-12-14 DIAGNOSIS — Z853 Personal history of malignant neoplasm of breast: Secondary | ICD-10-CM | POA: Insufficient documentation

## 2015-12-14 DIAGNOSIS — Z9223 Personal history of estrogen therapy: Secondary | ICD-10-CM | POA: Diagnosis not present

## 2015-12-14 DIAGNOSIS — C569 Malignant neoplasm of unspecified ovary: Secondary | ICD-10-CM

## 2015-12-14 MED ORDER — HEPARIN SOD (PORK) LOCK FLUSH 100 UNIT/ML IV SOLN
500.0000 [IU] | Freq: Once | INTRAVENOUS | Status: AC
  Administered 2015-12-14: 500 [IU] via INTRAVENOUS
  Filled 2015-12-14: qty 5

## 2015-12-14 MED ORDER — SODIUM CHLORIDE 0.9% FLUSH
10.0000 mL | INTRAVENOUS | Status: DC | PRN
  Administered 2015-12-14: 10 mL via INTRAVENOUS
  Filled 2015-12-14: qty 10

## 2015-12-15 LAB — CA 125: CA 125: 24.4 U/mL (ref 0.0–38.1)

## 2015-12-16 DIAGNOSIS — E785 Hyperlipidemia, unspecified: Secondary | ICD-10-CM | POA: Diagnosis not present

## 2015-12-16 DIAGNOSIS — E039 Hypothyroidism, unspecified: Secondary | ICD-10-CM | POA: Diagnosis not present

## 2015-12-17 LAB — LIPID PANEL WITH LDL/HDL RATIO
Cholesterol, Total: 204 mg/dL — ABNORMAL HIGH (ref 100–199)
HDL: 53 mg/dL (ref >39)
LDL Calculated: 129 mg/dL — ABNORMAL HIGH (ref 0–99)
LDl/HDL Ratio: 2.4 ratio units (ref 0.0–3.2)
TRIGLYCERIDES: 112 mg/dL (ref 0–149)
VLDL Cholesterol Cal: 22 mg/dL (ref 5–40)

## 2015-12-17 LAB — TSH: TSH: 3.52 u[IU]/mL (ref 0.450–4.500)

## 2015-12-21 ENCOUNTER — Ambulatory Visit: Payer: PPO

## 2015-12-30 DIAGNOSIS — L814 Other melanin hyperpigmentation: Secondary | ICD-10-CM | POA: Diagnosis not present

## 2015-12-30 DIAGNOSIS — L821 Other seborrheic keratosis: Secondary | ICD-10-CM | POA: Diagnosis not present

## 2016-01-04 ENCOUNTER — Inpatient Hospital Stay: Payer: PPO | Attending: Obstetrics and Gynecology | Admitting: Obstetrics and Gynecology

## 2016-01-04 ENCOUNTER — Other Ambulatory Visit: Payer: Self-pay

## 2016-01-04 ENCOUNTER — Encounter: Payer: Self-pay | Admitting: Obstetrics and Gynecology

## 2016-01-04 VITALS — BP 138/79 | HR 97 | Temp 97.6°F | Wt 181.4 lb

## 2016-01-04 DIAGNOSIS — K279 Peptic ulcer, site unspecified, unspecified as acute or chronic, without hemorrhage or perforation: Secondary | ICD-10-CM | POA: Insufficient documentation

## 2016-01-04 DIAGNOSIS — K5900 Constipation, unspecified: Secondary | ICD-10-CM | POA: Insufficient documentation

## 2016-01-04 DIAGNOSIS — I480 Paroxysmal atrial fibrillation: Secondary | ICD-10-CM | POA: Insufficient documentation

## 2016-01-04 DIAGNOSIS — E039 Hypothyroidism, unspecified: Secondary | ICD-10-CM | POA: Insufficient documentation

## 2016-01-04 DIAGNOSIS — K219 Gastro-esophageal reflux disease without esophagitis: Secondary | ICD-10-CM

## 2016-01-04 DIAGNOSIS — Z8543 Personal history of malignant neoplasm of ovary: Secondary | ICD-10-CM | POA: Diagnosis not present

## 2016-01-04 DIAGNOSIS — Z79899 Other long term (current) drug therapy: Secondary | ICD-10-CM | POA: Diagnosis not present

## 2016-01-04 DIAGNOSIS — F419 Anxiety disorder, unspecified: Secondary | ICD-10-CM | POA: Diagnosis not present

## 2016-01-04 DIAGNOSIS — E7801 Familial hypercholesterolemia: Secondary | ICD-10-CM | POA: Diagnosis not present

## 2016-01-04 DIAGNOSIS — Z800 Family history of malignant neoplasm of digestive organs: Secondary | ICD-10-CM | POA: Diagnosis not present

## 2016-01-04 DIAGNOSIS — I10 Essential (primary) hypertension: Secondary | ICD-10-CM | POA: Diagnosis not present

## 2016-01-04 DIAGNOSIS — C569 Malignant neoplasm of unspecified ovary: Secondary | ICD-10-CM | POA: Insufficient documentation

## 2016-01-04 DIAGNOSIS — N898 Other specified noninflammatory disorders of vagina: Secondary | ICD-10-CM | POA: Diagnosis not present

## 2016-01-04 DIAGNOSIS — R971 Elevated cancer antigen 125 [CA 125]: Secondary | ICD-10-CM | POA: Diagnosis not present

## 2016-01-04 DIAGNOSIS — Z923 Personal history of irradiation: Secondary | ICD-10-CM

## 2016-01-04 DIAGNOSIS — Z803 Family history of malignant neoplasm of breast: Secondary | ICD-10-CM | POA: Insufficient documentation

## 2016-01-04 DIAGNOSIS — K449 Diaphragmatic hernia without obstruction or gangrene: Secondary | ICD-10-CM | POA: Insufficient documentation

## 2016-01-04 DIAGNOSIS — I340 Nonrheumatic mitral (valve) insufficiency: Secondary | ICD-10-CM | POA: Insufficient documentation

## 2016-01-04 DIAGNOSIS — Z006 Encounter for examination for normal comparison and control in clinical research program: Secondary | ICD-10-CM | POA: Diagnosis not present

## 2016-01-04 DIAGNOSIS — Z853 Personal history of malignant neoplasm of breast: Secondary | ICD-10-CM | POA: Insufficient documentation

## 2016-01-04 DIAGNOSIS — Z90722 Acquired absence of ovaries, bilateral: Secondary | ICD-10-CM | POA: Insufficient documentation

## 2016-01-04 DIAGNOSIS — Z9221 Personal history of antineoplastic chemotherapy: Secondary | ICD-10-CM | POA: Diagnosis not present

## 2016-01-04 DIAGNOSIS — G4700 Insomnia, unspecified: Secondary | ICD-10-CM | POA: Insufficient documentation

## 2016-01-04 DIAGNOSIS — M5137 Other intervertebral disc degeneration, lumbosacral region: Secondary | ICD-10-CM | POA: Diagnosis not present

## 2016-01-04 DIAGNOSIS — M8580 Other specified disorders of bone density and structure, unspecified site: Secondary | ICD-10-CM | POA: Diagnosis not present

## 2016-01-04 DIAGNOSIS — G629 Polyneuropathy, unspecified: Secondary | ICD-10-CM | POA: Insufficient documentation

## 2016-01-04 NOTE — Progress Notes (Signed)
Gynecologic Oncology Interval Visit   Referring Provider: Blain Pais, MD.  Chief Concern: Continued surveillance for stage IIIc ovarian cancer  Subjective:  Jamie Conner is a 75 y.o. female who is seen for continued surveillance for stage IIIc ovarian cancer.   She is doing well and has symptoms of constipation and peripheral neuropathy involving her hands. She is concerned about her most recent CA125 that is 24.4. She also is concerned about who will be assuming her care after Dr. Oliva Bustard leaves and if another CT scan should be ordered. She is surprised that she has not lost any weight on the LIVES study.    Lab Results  Component Value Date   CA125 24.4 12/14/2015   CA125 17.8 09/13/2015   CA125 14.5 07/20/2015      Gynecologic Oncology History Jamie Conner is a pleasant female who is seen for postoperative visit for stage IIIc ovarian cancer. See prior notes for complete detail.  She also has a history of  carcinoma of breast ( left) status post lumpectomy , radiation therapy and 5 years of tamoxifen. She underwent exploratory Laparotomy, bilateral salpingo-oophorectomy, right ureterolysis, infragastric omentectomy, and optimally debulked to no gross residual  May 4th, 2016. Preop CA125 2354.0.  She started chemotherapy with carboplatinum and Taxol in dose dense fashion from Jan 02, 2015. She underwent 6 cycles of chemotherapy completed 05/09/2015. Her dose of Taxol was reduced because of myelosuppression and fever in spite of Neulasta.  [place] CA125 = 9.8  06/06/2015 CT scan abdomen and pelvis IMPRESSION: 1. Interval removal of the large right ovarian mass . Dramatic improvement in the appearance of mesenteric and omental implants. There is a 3 mm potential faint omental nodule at about the level of the umbilicus but for the most part the numerous prior omental and mesenteric tumor implants have resolved completely. 2. Small type 1 hiatal hernia. 3. Segment 7  hepatic hemangioma, chronically stable. 4. 1.8 by 1.2 cm fluid density structure along the splenic hilum, no change from prior, likely a small benign cystic lesion, but meriting observation in the context of the patient' s ovarian cancer. 5. Other imaging findings of potential clinical significance: 3 cm periampullary duodenal diverticulum. Aortoiliac atherosclerotic vascular disease. Mild lower lumbar spondylosis and degenerative disc disease   Genetic testing: ATM mutation [id number].  I spoke with Dr. Lisbeth Ply at [facility] and he recommended genetic counseling for the patient and possibly other family members. He provided a phone number for them to call to schedule that appointment. The number is [phone].  Problem List: Patient Active Problem List   Diagnosis Date Noted  . Breast cancer in female [facility]) 12/13/2015  . Peptic ulcer disease 12/12/2015  . DDD (degenerative disc disease), lumbosacral 12/12/2015  . Hyperlipidemia 12/12/2015  . Acute anxiety 12/12/2015  . Insomnia 12/12/2015  . Allergic rhinitis 12/12/2015  . GERD (gastroesophageal reflux disease) 12/12/2015  . Internal hemorrhoid 12/12/2015  . OA (osteoarthritis) 12/12/2015  . Osteopenia 12/12/2015  . Hypothyroid 04/11/2015  . Benign essential HTN 02/01/2015  . Retroperitoneal fibrosis   . Combined fat and carbohydrate induced hyperlipemia 01/04/2015  . Awareness of heartbeats 01/04/2015  . Beat, premature ventricular 01/04/2015  . Ovarian cancer ([place]) 12/16/2014  . MI (mitral incompetence) 09/02/2014  . AF (paroxysmal atrial fibrillation) ([place]) 09/02/2014    Past Medical History: Past Medical History  Diagnosis Date  . Atrial fibrillation ([place])   . Hyperlipidemia   . PVC (premature ventricular contraction)   . Mitral insufficiency   .  GERD (gastroesophageal reflux disease)   . Hypothyroidism   . Cancer of breast ([place]) 1992    Left  . Ovarian cancer [facility])     s/p BSO optimal tumor debulking May 2016     Past Surgical History: Past Surgical History  Procedure Laterality Date  . Cesarean section      x2  . Abdominal hysterectomy    . Cholecystectomy    . Colonoscopy  2006  . Breast surgery      Left  . Laparotomy with staging N/A 12/22/2014    Procedure: LAPAROTOMY WITH STAGING;  Surgeon: Gillis Ends, MD;  Location: [facility] ORS;  Service: Gynecology;  Laterality: N/A;  . Salpingoophorectomy Bilateral 12/22/2014    Procedure: SALPINGO OOPHORECTOMY;  Surgeon: Gillis Ends, MD;  Location: [facility] ORS;  Service: Gynecology;  Laterality: Bilateral;  . Debulking N/A 12/22/2014    Procedure: DEBULKING;  Surgeon: Gillis Ends, MD;  Location: [facility] ORS;  Service: Gynecology;  Laterality: N/A;  . Laparotomy N/A 12/22/2014    Procedure: EXPLORATORY LAPAROTOMY;  Surgeon: Robert Bellow, MD;  Location: [facility] ORS;  Service: General;  Laterality: N/A;  . Portacath placement Right 12/22/2014    Procedure: INSERTION PORT-A-CATH;  Surgeon: Robert Bellow, MD;  Location: [facility] ORS;  Service: General;  Laterality: Right;  . Peripheral vascular catheterization Left 12/22/2014    Procedure: PORTA CATH INSERTION;  Surgeon: Robert Bellow, MD;  Location: [facility] ORS;  Service: General;  Laterality: Left;  . Bilateral salpingoophorectomy  May 2016    with optimal tumor debulking   . Upper gi endoscopy  09/25/04    hiatus hernia, schatzki ring and a single gastric polyp    Past Gynecologic History:  See HPI  OB History:  OB History  Gravida Para Term Preterm AB SAB TAB Ectopic Multiple Living  2         2    # Outcome Date GA Lbr Len/2nd Weight Sex Delivery Anes PTL Lv  2 Gravida           1 Gravida             Obstetric Comments  1st Menstrual Cycle:  14  1st Pregnancy:  17    Family History: Family History  Problem Relation Age of Onset  . Cancer Mother     breast, throat, and stomach  . Breast cancer Mother   . Heart disease Father   . Pulmonary embolism Sister   .  Pulmonary embolism Other   . Cancer Other   . Cancer Brother 60    angiosarcoma of the chest; carcinoid small intestinal tumor  . Hypertension Brother   . Cancer Maternal Aunt     breast cancer  . Cancer Maternal Grandfather 76    pancreatic    Social History: Social History   Social History  . Marital Status: Married    Spouse Name: N/A  . Number of Children: N/A  . Years of Education: N/A   Occupational History  . Not on file.   Social History Main Topics  . Smoking status: Never Smoker   . Smokeless tobacco: Never Used  . Alcohol Use: No  . Drug Use: No  . Sexual Activity: Not Currently   Other Topics Concern  . Not on file   Social History Narrative    Allergies: Allergies  Allergen Reactions  . Carafate [Sucralfate] Rash  . Sulfa Antibiotics Rash    Current Medications: Current Outpatient Prescriptions  Medication Sig Dispense  Refill  . acetaminophen (TYLENOL) 500 MG chewable tablet Chew 500 mg by mouth every 6 (six) hours as needed for pain.    Marland Kitchen levothyroxine (SYNTHROID, LEVOTHROID) 50 MCG tablet TAKE 1 TABLET EVERY DAY 30 tablet 12  . lidocaine-prilocaine (EMLA) cream APPLY TO AFFECTED AREA IF NEEDED 30 g 1  . LORazepam (ATIVAN) 0.5 MG tablet Take 1 tablet (0.5 mg total) by mouth at bedtime. 30 tablet 3  . mometasone (ELOCON) 0.1 % cream Apply 1 application topically daily. Apply to affected area no more than 2 weeks a month. 45 g 0  . pantoprazole (PROTONIX) 40 MG tablet TAKE 1 TABLET EVERY DAY 30 tablet 12  . polyethylene glycol (MIRALAX / GLYCOLAX) packet Take 17 g by mouth daily.    . propafenone (RYTHMOL) 225 MG tablet Take 225 mg by mouth 2 (two) times daily.     No current facility-administered medications for this visit.   Facility-Administered Medications Ordered in Other Visits  Medication Dose Route Frequency Provider Last Rate Last Dose  . sodium chloride 0.9 % injection 10 mL  10 mL Intracatheter PRN Forest Gleason, MD   10 mL at 02/07/15  0930    Review of Systems General: no complaints  HEENT: no complaints  Lungs: no complaints  Cardiac: no complaints  GI: constipation  GU: no complaints  Musculoskeletal: no complaints  Extremities: no complaints  Skin: no complaints  Neuro: peripheral neuropathy  Endocrine: no complaints  Psych: no complaints       Objective:  Physical Examination:  BP 138/79 mmHg  Pulse 97  Temp(Src) 97.6 F (36.4 C)  Wt 181 lb 7 oz (82.3 kg). Weight is stable.    ECOG Performance Status: 0 - Asymptomatic  General appearance: alert, cooperative and appears stated age 26: ATNC CV: RRR Lungs: BCTA Lymph node survey: non-palpable inguinal, axillary and [place] nodes Abdomen: SNDNT. Incision all well healed. No hernias, no masses, no ascites.  Extremities:No edema Skin exam - Well healed incisions.  Neurological exam reveals alert, oriented, normal speech, no focal findings or movement disorder noted  Pelvic: Vulva: normal appearing vulva with no masses, tenderness or lesions; Vagina: normal except a 8 mm lesion on the upper vaginal lateral wall; there also appears to be a left paravaginal cyst approximately mid portion of the vagina, on BME these areas were not appreciated; BME: negative for masses or nodularity; Uterus and Cervix: surgically absent; Rectal: confirms  VAGINAL BIOPSY:  The risks and benefits of the procedure were reviewed and informed consent obtained. Time out was performed. The patient received pre-procedure teaching and expressed understanding. The post-procedure instructions were reviewed with the patient and she expressed understanding. The patient does not have any barriers to learning.  The speculum was replaced. Vaginal lesion cleansed with betadine. Biopsy performed with biopsy forceps. Hemostasis adequate with AgNO3. No complications.  Post-procedure evaluation the patient was stable without complaints.  Lab Review N/a  Radiologic Imaging: none     Assessment:  Jamie Conner is a 75 y.o. female diagnosed with optimally debulked stage IIIc ovarian cancer s/p chemotherapy with no definitive evidence of disease on CT scan and normalization with CA125 with negative exam. Now slightly elevated CA125, double the nadir value.  ATM mutation. Extensive family history of cancer (breast, pancreatic, throat, gastric, adenosarcoma of the chest, carcinoid tumor of the small bowel) in several first degree relatives.  Vaginal lesion, uncertain etiology.  Plan:   Problem List Items Addressed This Visit      Genitourinary  Ovarian cancer ([place]) - Primary (Chronic)   Relevant Orders   Surgical pathology    Other Visit Diagnoses    Vaginal lesion           We will follow up on CA125 and vaginal biopsy ordered today. If CA125 continuing to rise we will order a CT scan.   If concerns for malignancy or positive vaginal biopsy we will request follow up with Dr. Oliva Bustard or one of his associates.    If findings are all negative we will continue alternating surveillance with Dr. Metro Kung associates every 3 months and return to our clinic in 6 months.       Gillis Ends, MD    CC:  Blain Pais, MD.

## 2016-01-04 NOTE — Progress Notes (Signed)
Patient returns to clinic today for her 3 month visit on the [place] study. She states that she is not having any new symptoms or problems and is continuing to keep daily food and exercise logs as well as telephone consults with the study intervention coach. Neuropathy remains the same with no changes.  Reviewed concomitant medications with the patient and no changes to report at this time. Waist circumference measures 41 inches (104.14cm) today. Dr. Theora Gianotti examined patient and performed a vaginal biopsy due to area of suspicion on GYN exam. Will wait for biopsy results to decide if CT is needed.  Today's vital signs are as follows: weight 181lbs. (82.3 kg) , temp. 97.6, B/P 138/79.  Dr. Oliva Bustard referred her care to Dr. Rogue Bussing and she will return on March 15, 2016 to see Dr. Rogue Bussing and have fasting [facility]. Port flush also scheduled for January 25, 2016 at 3:15pm; telephone call to patient to inform. Mirian Mo, RN, BSN 01/04/2016 1:26 PM       Adverse Event Log  Study/Protocol: GOG 0225 LIVES Cycle: Month 3 Visit  Event Grade Onset Date Resolved Date Drug Name Attribution Treatment Comments  Mild Neuropathy Grade 1 Prior to study  n/a Unrelated None Prior Taxol  Mild Constipation Grade 1 unknown  n/a Unrelated None    Received phone call from Mariea Clonts, Nurse Navigegator, regarding biopsy results. Biopsy was negative for dysplasia/malignancy.  Dr. Theora Gianotti ordered CT scan due to CA-125 results.  Mirian Mo, RN, BSN 01/06/2016 3:40 PM

## 2016-01-04 NOTE — Progress Notes (Signed)
Assisted MD with pelvic exam

## 2016-01-05 LAB — CA 125: CA 125: 34.1 U/mL (ref 0.0–38.1)

## 2016-01-05 LAB — SURGICAL PATHOLOGY

## 2016-01-06 ENCOUNTER — Other Ambulatory Visit: Payer: Self-pay | Admitting: Family Medicine

## 2016-01-06 ENCOUNTER — Telehealth: Payer: Self-pay

## 2016-01-06 DIAGNOSIS — C569 Malignant neoplasm of unspecified ovary: Secondary | ICD-10-CM

## 2016-01-06 NOTE — Telephone Encounter (Signed)
  Oncology Nurse Navigator Documentation  Navigator Location: [facility] (01/06/16 1500) Navigator Encounter Type: Telephone;Diagnostic Results;Follow-up Appt (01/06/16 1500) Telephone: Diagnostic Results (01/06/16 1500)                                        Time Spent with Patient: 15 (01/06/16 1500)   Jamie Conner notified of recent biopsy results and Ca-125. Instructed she will need CT scan of chest/abdomen/pelvis per Dr Theora Gianotti. Appt will be arrange for follow up 5/31. Scheduling to call regarding CT date/time

## 2016-01-09 ENCOUNTER — Other Ambulatory Visit: Payer: Self-pay

## 2016-01-09 ENCOUNTER — Other Ambulatory Visit: Payer: Self-pay | Admitting: Oncology

## 2016-01-09 DIAGNOSIS — C569 Malignant neoplasm of unspecified ovary: Secondary | ICD-10-CM

## 2016-01-10 ENCOUNTER — Ambulatory Visit
Admission: RE | Admit: 2016-01-10 | Discharge: 2016-01-10 | Disposition: A | Discharge status: Home / Self Care | Payer: PPO | Source: Ambulatory Visit | Attending: Obstetrics and Gynecology | Admitting: Obstetrics and Gynecology

## 2016-01-10 ENCOUNTER — Other Ambulatory Visit: Payer: Self-pay

## 2016-01-10 DIAGNOSIS — Z01812 Encounter for preprocedural laboratory examination: Secondary | ICD-10-CM | POA: Insufficient documentation

## 2016-01-10 DIAGNOSIS — K7689 Other specified diseases of liver: Secondary | ICD-10-CM | POA: Diagnosis not present

## 2016-01-10 DIAGNOSIS — Z90710 Acquired absence of both cervix and uterus: Secondary | ICD-10-CM | POA: Insufficient documentation

## 2016-01-10 DIAGNOSIS — Z0389 Encounter for observation for other suspected diseases and conditions ruled out: Secondary | ICD-10-CM | POA: Diagnosis not present

## 2016-01-10 DIAGNOSIS — K5730 Diverticulosis of large intestine without perforation or abscess without bleeding: Secondary | ICD-10-CM | POA: Diagnosis not present

## 2016-01-10 DIAGNOSIS — C569 Malignant neoplasm of unspecified ovary: Secondary | ICD-10-CM

## 2016-01-10 DIAGNOSIS — D1803 Hemangioma of intra-abdominal structures: Secondary | ICD-10-CM | POA: Insufficient documentation

## 2016-01-10 LAB — POCT I-STAT CREATININE: CREATININE: 1 mg/dL (ref 0.44–1.00)

## 2016-01-10 MED ORDER — IOPAMIDOL (ISOVUE-300) INJECTION 61%
100.0000 mL | Freq: Once | INTRAVENOUS | Status: AC | PRN
  Administered 2016-01-10: 100 mL via INTRAVENOUS

## 2016-01-10 MED ORDER — LORAZEPAM 0.5 MG PO TABS: 0.5000 mg | ORAL_TABLET | Freq: Every day | ORAL | Status: DC

## 2016-01-10 NOTE — Telephone Encounter (Signed)
Done-aa 

## 2016-01-10 NOTE — Telephone Encounter (Signed)
Refill request from [facility] for refill on Lorazepam, Dr Marlan Palau patient.-aa

## 2016-01-18 ENCOUNTER — Inpatient Hospital Stay (HOSPITAL_BASED_OUTPATIENT_CLINIC_OR_DEPARTMENT_OTHER): Payer: PPO | Admitting: Obstetrics and Gynecology

## 2016-01-18 ENCOUNTER — Ambulatory Visit (HOSPITAL_BASED_OUTPATIENT_CLINIC_OR_DEPARTMENT_OTHER): Payer: PPO | Admitting: Oncology

## 2016-01-18 VITALS — BP 129/75 | HR 73 | Temp 97.0°F | Ht 63.5 in | Wt 181.5 lb

## 2016-01-18 DIAGNOSIS — G629 Polyneuropathy, unspecified: Secondary | ICD-10-CM

## 2016-01-18 DIAGNOSIS — Z8543 Personal history of malignant neoplasm of ovary: Secondary | ICD-10-CM

## 2016-01-18 DIAGNOSIS — Z006 Encounter for examination for normal comparison and control in clinical research program: Secondary | ICD-10-CM

## 2016-01-18 DIAGNOSIS — Z79899 Other long term (current) drug therapy: Secondary | ICD-10-CM

## 2016-01-18 DIAGNOSIS — C569 Malignant neoplasm of unspecified ovary: Secondary | ICD-10-CM | POA: Diagnosis not present

## 2016-01-18 DIAGNOSIS — R971 Elevated cancer antigen 125 [CA 125]: Secondary | ICD-10-CM

## 2016-01-18 DIAGNOSIS — I10 Essential (primary) hypertension: Secondary | ICD-10-CM

## 2016-01-18 DIAGNOSIS — N898 Other specified noninflammatory disorders of vagina: Secondary | ICD-10-CM

## 2016-01-18 DIAGNOSIS — F419 Anxiety disorder, unspecified: Secondary | ICD-10-CM

## 2016-01-18 DIAGNOSIS — I340 Nonrheumatic mitral (valve) insufficiency: Secondary | ICD-10-CM

## 2016-01-18 DIAGNOSIS — I480 Paroxysmal atrial fibrillation: Secondary | ICD-10-CM

## 2016-01-18 NOTE — Progress Notes (Signed)
The patient met with Dr. Theora Gianotti and the research team of Mirian Mo, RN and Yolande Jolly, RN to review and discuss the results of a CT scan done on 01/10/2016. Dr. Theora Gianotti confirmed with the patient that the CT scan showed progression of disease. Due to CT results patient is now off active study for the protocol GOG-0225 LIVES. She reviewed treatment options with the patient and her husband and provided information related to a clinical trial that is available at [facility] if she should decide to participate in the trial.  All of her questions were answered and information about the study was provided to her by Dr. Theora Gianotti.  Dr. Oliva Bustard also met with the patient and her husband and discussed available options for treatment. Dr. Oliva Bustard also informed patient of his retirement in June and made referral to Dr. Rogue Bussing to continue with her care.   She will consider treatment options and return to clinic in 5 weeks. Mariea Clonts, nurse navigator will contact the patient in 2 weeks to follow up and schedule her appointment at which time she will let us know if she would like to proceed with protocol therapy at [facility] or of protocol treatment.  Mirian Mo, RN, BSN 01/18/2016 2:27 PM

## 2016-01-18 NOTE — Progress Notes (Signed)
Gynecologic Oncology Interval Visit   Referring Provider: Blain Pais, MD.  Chief Concern: Newly diagnosed platinum-sensitive recurrent stage IIIc ovarian cancer  Subjective:  Jamie Conner is a 75 y.o. female who is seen for newly diagnosed platinum-sensitive recurrent stage IIIc ovarian cancer.   Her CT scan 01/10/2016 revealed  CT CHEST: No pneumothorax or pleural effusion is noted. No acute pulmonary disease is noted. There is no evidence of thoracic aortic aneurysm or dissection. Visualized portions of pulmonary arteries appear normal. No mediastinal mass or adenopathy is noted. Left subclavian Port-A-Cath is noted. No significant osseous abnormality is noted.  CT ABDOMEN AND PELVIS: Status post cholecystectomy. Stable hemangioma seen in posterior segment of right hepatic lobe. Pancreas appears normal. Stable 1.9 cm cystic lesion seen in splenic hilum. Spleen appears otherwise normal. Adrenal glands and kidneys appear normal. No hydronephrosis or renal obstruction is noted. Atherosclerosis of abdominal aorta is noted without aneurysm formation. There is no evidence of bowel obstruction. Stool is noted throughout the colon. Sigmoid diverticulosis is noted without inflammation. Urinary bladder appears normal. Status post hysterectomy and bilateral oophorectomy. No abnormal fluid collection is noted.  New nodular densi12 x 7 mm ties are seen throughout the pelvis. The largest measures 12 x 10 mm best seen on image number 105 of series 2. A 9 mm nodule is seen in the left pelvis best seen on image number 60 of series 5. These are concerning for possible peritoneal implants.  IMPRESSION: No definite abnormality seen within the chest.  Stable hemangioma seen in posterior segment of right hepatic lobe.  Stable 1.9 cm cystic lesion seen in splenic hilum.  Sigmoid diverticulosis is noted without inflammation.  Status post hysterectomy. New nodular densities are seen  throughout the pelvis with the largest measuring 12 mm. Given the history of ovarian carcinoma, these are concerning for possible peritoneal implants, which would be consistent with peritoneal carcinomatosis.  Vaginal biopsy performed on 01/04/2016 DIAGNOSIS:  A. RIGHT VAGINAL FORNIX; BIOPSY:  - NODULAR GRANULATION TYPE TISSUE WITH SQUAMOUS INCLUSIONS, FIBROSIS AND  ASSOCIATED CALCIFICATION.  - NEGATIVE FOR DYSPLASIA AND MALIGNANCY.     Lab Results  Component Value Date   CA125 34.1 01/04/2016   CA125 24.4 12/14/2015   CA125 17.8 09/13/2015   She is asymptomatic.    Gynecologic Oncology History Jamie Conner is a pleasant female who is seen for postoperative visit for stage IIIc high-grade serous ovarian cancer. See prior notes for complete detail.  She also has a history of  carcinoma of breast ( left) status post lumpectomy , radiation therapy and 5 years of tamoxifen. She underwent exploratory Laparotomy, bilateral salpingo-oophorectomy, right ureterolysis, infragastric omentectomy, and optimally debulked to no gross residual  May 4th, 2016. Preop CA125 2354.0.  She started chemotherapy with carboplatinum and Taxol in dose dense fashion from Jan 02, 2015. She underwent 6 cycles of chemotherapy completed 05/09/2015. Her dose of Taxol was reduced because of myelosuppression and fever in spite of Neulasta.  Nadir CA125 = 9.8  06/06/2015 CT scan abdomen and pelvis IMPRESSION: 1. Interval removal of the large right ovarian mass . Dramatic improvement in the appearance of mesenteric and omental implants. There is a 3 mm potential faint omental nodule at about the level of the umbilicus but for the most part the numerous prior omental and mesenteric tumor implants have resolved completely. 2. Small type 1 hiatal hernia. 3. Segment 7 hepatic hemangioma, chronically stable. 4. 1.8 by 1.2 cm fluid density structure along the splenic hilum, no change  from prior, likely a  small benign cystic lesion, but meriting observation in the context of the patient' s ovarian cancer. 5. Other imaging findings of potential clinical significance: 3 cm periampullary duodenal diverticulum. Aortoiliac atherosclerotic vascular disease. Mild lower lumbar spondylosis and degenerative disc disease   Genetic testing: ATM mutation [id number].  I spoke with Dr. Lynnette Caffey at [facility] and he recommended genetic counseling for the patient and possibly other family members. He provided a phone number for them to call to schedule that appointment. The number is [phone].  Problem List: Patient Active Problem List   Diagnosis Date Noted  . Breast cancer in female [facility]) 12/13/2015  . Peptic ulcer disease 12/12/2015  . DDD (degenerative disc disease), lumbosacral 12/12/2015  . Hyperlipidemia 12/12/2015  . Acute anxiety 12/12/2015  . Insomnia 12/12/2015  . Allergic rhinitis 12/12/2015  . GERD (gastroesophageal reflux disease) 12/12/2015  . Internal hemorrhoid 12/12/2015  . OA (osteoarthritis) 12/12/2015  . Osteopenia 12/12/2015  . Hypothyroid 04/11/2015  . Benign essential HTN 02/01/2015  . Retroperitoneal fibrosis   . Combined fat and carbohydrate induced hyperlipemia 01/04/2015  . Awareness of heartbeats 01/04/2015  . Beat, premature ventricular 01/04/2015  . Ovarian cancer (HCC) 12/16/2014  . MI (mitral incompetence) 09/02/2014  . AF (paroxysmal atrial fibrillation) (HCC) 09/02/2014    Past Medical History: Past Medical History  Diagnosis Date  . Atrial fibrillation (HCC)   . Hyperlipidemia   . PVC (premature ventricular contraction)   . Mitral insufficiency   . GERD (gastroesophageal reflux disease)   . Hypothyroidism   . Cancer of breast (HCC) 1992    Left  . Ovarian cancer [facility])     s/p BSO optimal tumor debulking May 2016    Past Surgical History: Past Surgical History  Procedure Laterality Date  . Cesarean section      x2  . Abdominal hysterectomy     . Cholecystectomy    . Colonoscopy  2006  . Breast surgery      Left  . Laparotomy with staging N/A 12/22/2014    Procedure: LAPAROTOMY WITH STAGING;  Surgeon: Artelia Laroche, MD;  Location: [facility] ORS;  Service: Gynecology;  Laterality: N/A;  . Salpingoophorectomy Bilateral 12/22/2014    Procedure: SALPINGO OOPHORECTOMY;  Surgeon: Artelia Laroche, MD;  Location: [facility] ORS;  Service: Gynecology;  Laterality: Bilateral;  . Debulking N/A 12/22/2014    Procedure: DEBULKING;  Surgeon: Artelia Laroche, MD;  Location: [facility] ORS;  Service: Gynecology;  Laterality: N/A;  . Laparotomy N/A 12/22/2014    Procedure: EXPLORATORY LAPAROTOMY;  Surgeon: Earline Mayotte, MD;  Location: [facility] ORS;  Service: General;  Laterality: N/A;  . Portacath placement Right 12/22/2014    Procedure: INSERTION PORT-A-CATH;  Surgeon: Earline Mayotte, MD;  Location: [facility] ORS;  Service: General;  Laterality: Right;  . Peripheral vascular catheterization Left 12/22/2014    Procedure: PORTA CATH INSERTION;  Surgeon: Earline Mayotte, MD;  Location: [facility] ORS;  Service: General;  Laterality: Left;  . Bilateral salpingoophorectomy  May 2016    with optimal tumor debulking   . Upper gi endoscopy  09/25/04    hiatus hernia, schatzki ring and a single gastric polyp    Past Gynecologic History:  See HPI  OB History:  OB History  Gravida Para Term Preterm AB SAB TAB Ectopic Multiple Living  2         2    # Outcome Date GA Lbr Len/2nd Weight Sex Delivery Anes PTL Lv  2 [place]  1 Gravida             Obstetric Comments  1st Menstrual Cycle:  14  1st Pregnancy:  23    Family History: Family History  Problem Relation Age of Onset  . Cancer Mother     breast, throat, and stomach  . Breast cancer Mother   . Heart disease Father   . Pulmonary embolism Sister   . Pulmonary embolism Other   . Cancer Other   . Cancer Brother 60    angiosarcoma of the chest; carcinoid small intestinal tumor  .  Hypertension Brother   . Cancer Maternal Aunt     breast cancer  . Cancer Maternal Grandfather 10    pancreatic    Social History: Social History   Social History  . Marital Status: Married    Spouse Name: N/A  . Number of Children: N/A  . Years of Education: N/A   Occupational History  . Not on file.   Social History Main Topics  . Smoking status: Never Smoker   . Smokeless tobacco: Never Used  . Alcohol Use: No  . Drug Use: No  . Sexual Activity: Not Currently   Other Topics Concern  . Not on file   Social History Narrative    Allergies: Allergies  Allergen Reactions  . Carafate [Sucralfate] Rash  . Sulfa Antibiotics Rash    Current Medications: Current Outpatient Prescriptions  Medication Sig Dispense Refill  . acetaminophen (TYLENOL) 500 MG chewable tablet Chew 500 mg by mouth every 6 (six) hours as needed for pain.    Marland Kitchen levothyroxine (SYNTHROID, LEVOTHROID) 50 MCG tablet TAKE 1 TABLET EVERY DAY 30 tablet 12  . lidocaine-prilocaine (EMLA) cream APPLY TO AFFECTED AREA IF NEEDED 30 g 1  . LORazepam (ATIVAN) 0.5 MG tablet Take 1 tablet (0.5 mg total) by mouth at bedtime. 30 tablet 3  . mometasone (ELOCON) 0.1 % cream Apply 1 application topically daily. Apply to affected area no more than 2 weeks a month. 45 g 0  . pantoprazole (PROTONIX) 40 MG tablet TAKE 1 TABLET EVERY DAY 30 tablet 12  . polyethylene glycol (MIRALAX / GLYCOLAX) packet Take 17 g by mouth daily.    . propafenone (RYTHMOL) 225 MG tablet Take 225 mg by mouth 2 (two) times daily.     No current facility-administered medications for this visit.   Facility-Administered Medications Ordered in Other Visits  Medication Dose Route Frequency Provider Last Rate Last Dose  . sodium chloride 0.9 % injection 10 mL  10 mL Intracatheter PRN Forest Gleason, MD   10 mL at 02/07/15 0930    Review of Systems General: no complaints  HEENT: no complaints  Lungs: no complaints  Cardiac: no complaints  GI:  constipation  GU: no complaints  Musculoskeletal: no complaints  Extremities: no complaints  Skin: no complaints  Neuro: peripheral neuropathy  Endocrine: no complaints  Psych: no complaints       Objective:  Physical Examination:  BP 129/75 mmHg  Pulse 73  Temp(Src) 97 F (36.1 C) (Tympanic)  Ht 5' 3.5" (1.613 m)  Wt 181 lb 8.8 oz (82.35 kg)  BMI 31.65 kg/m2. Weight is stable.    ECOG Performance Status: 0 - Asymptomatic  Exam deferred   Lab Review N/a  Radiologic Imaging: none    Assessment:  KASHMIR LEEDY is a 75 y.o. female diagnosed with optimally debulked stage IIIc  high-grade serous ovarian cancer s/p chemotherapy with complete response. Platinum-sensitive recurrent ovarian cancer diagnosed  01/10/2016.  ATM mutation. Extensive family history of cancer (breast, pancreatic, throat, gastric, adenosarcoma of the chest, carcinoid tumor of the small bowel) in several first degree relatives.  Vaginal lesion, granulation tissue.   Plan:   Problem List Items Addressed This Visit      Genitourinary   Ovarian cancer ([place]) - Primary (Chronic)      A long discussion was held with the patient and her husband regarding options for management of an asymptomatic small volume platinum sensitive recurrent ovarian cancer.  Institution of chemotherapy either off protocol or on clinical trial (GY004) are options. Chemotherapy options off trial include doublet platinum based therapy either with taxanes, PLD, or gemcitabine. Given her concerns about alopecia and neuropathy a non-taxane combination may be best. We discussed the addition of bevacizumab was associated with a 3-4 month PFS benefit and 6 month OS benefit. Complete response rates were approximately 30-35% with bevacizumab - double that compared to chemotherapy alone.  She has a ATM mutation (HRD+) and may have a 9 month PFS benefit with PARP inhibitor "switch maintenance" therapy after 4-6 cycles of chemotherapy. Another  option is continued surveillance given the small volume of disease, but that makes me nervous. I can not definitely state that there is a survival advantage with introduction of chemotherapy compared to watchful waiting until the disease becomes symptomatic, but hypothetically I suspect that earlier reintroduction of therapy will increase the probability of achieving another remission.  She would like to delay re-starting therapy at least 5 weeks as her daughter is expecting to have a baby in approximately 3 weeks. She was provided with a GY004 consent form today. She will let us know if she would like to proceed with protocol therapy at [facility] or of protocol treatment.    Jamie Ends, MD    CC:  Blain Pais, MD.

## 2016-01-18 NOTE — Progress Notes (Signed)
Patient here for follow up and test results. No complaints today.

## 2016-01-24 ENCOUNTER — Encounter: Payer: Self-pay | Admitting: Oncology

## 2016-01-24 NOTE — Progress Notes (Signed)
[place] @ [facility] Telephone:(336) [phone]  Fax:([phone]) [place]: 1941/03/07  MR#: [id number]  WUX#:[id number]  Patient Care Team: Jerrol Banana., MD as PCP - General (Family Medicine) [facility] Gaetana Michaelis, MD as Referring Physician (Obstetrics and Gynecology) Robert Bellow, MD as Consulting Physician (General Surgery)  CHIEF COMPLAINT:  No chief complaint on file.   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 2.   carcinoma  of breast ( left) status post lumpectomy ,adiation  therapyand 5 years of tamoxifen. 3.history of atrial fibrillation being managed by cardiologist 4.  Status post bilateral salpingo-oophorectomy on fourth of May, 2016.  with optimal debulking surgery 5.  Starting chemotherapy with carboplatinum and Taxol in dose dense fashion from Jan 02, 2015 5.  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    6.Rising CA-125 with abnormal CT scan of abdomen digesting recurrent disease (MAY 2017)    INTERVAL HISTORY: 75 year old lady with a history of ovarian cancer here to initiate next cycle of chemotherapy.  This is the third cycle of treatment.  Patient has finished total 6 cycles of chemotherapy.    Patient had a recent pelvic examination biopsy from vagina  was negative for any malignancy.  Repeat CT scan which was abnormal. REVIEW OF SYSTEMS:    general status: Patient is feeling weak and tired.  No change in a performance status.  No chills.  No fever. HEENT: Alopecia.  No evidence of stomatitis Lungs: No cough or shortness of breath Cardiac: No chest pain or paroxysmal nocturnal dyspnea GI: No nausea no vomiting no diarrhea no abdominal pain Skin: No rash GU: No dysuria or hematuria Lower extremity no swelling Neurological system: No tingling.  No numbness.  No other focal signs Musculoskeletal  system no bony pains  As per HPI. Otherwise, a complete review of systems is negatve.  PAST MEDICAL HISTORY: Past Medical History  Diagnosis Date  . Atrial fibrillation ([place])   . Hyperlipidemia   . PVC (premature ventricular contraction)   . Mitral insufficiency   . GERD (gastroesophageal reflux disease)   . Hypothyroidism   . Cancer of breast ([place]) 1992    Left  . Ovarian cancer [facility])     s/p BSO optimal tumor debulking May 2016    PAST SURGICAL HISTORY: Past Surgical History  Procedure Laterality Date  . Cesarean section      x2  . Abdominal hysterectomy    . Cholecystectomy    . Colonoscopy  2006  . Breast surgery      Left  . Laparotomy with staging N/A 12/22/2014    Procedure: LAPAROTOMY WITH STAGING;  Surgeon: Gillis Ends, MD;  Location: [facility] ORS;  Service: Gynecology;  Laterality: N/A;  . Salpingoophorectomy Bilateral 12/22/2014    Procedure: SALPINGO OOPHORECTOMY;  Surgeon: Gillis Ends, MD;  Location: [facility] ORS;  Service: Gynecology;  Laterality: Bilateral;  . Debulking N/A 12/22/2014    Procedure: DEBULKING;  Surgeon: Gillis Ends, MD;  Location: [facility] ORS;  Service: Gynecology;  Laterality: N/A;  . Laparotomy N/A 12/22/2014    Procedure: EXPLORATORY LAPAROTOMY;  Surgeon: Robert Bellow, MD;  Location: [facility] ORS;  Service: General;  Laterality: N/A;  . Portacath placement Right 12/22/2014    Procedure: INSERTION PORT-A-CATH;  Surgeon: Robert Bellow, MD;  Location: [facility] ORS;  Service: General;  Laterality: Right;  . Peripheral vascular  catheterization Left 12/22/2014    Procedure: PORTA CATH INSERTION;  Surgeon: Robert Bellow, MD;  Location: [facility] ORS;  Service: General;  Laterality: Left;  . Bilateral salpingoophorectomy  May 2016    with optimal tumor debulking   . Upper gi endoscopy  09/25/04    hiatus hernia, schatzki ring and a single gastric polyp    FAMILY HISTORY Family History  Problem Relation Age of Onset  . Cancer Mother       breast, throat, and stomach  . Breast cancer Mother   . Heart disease Father   . Pulmonary embolism Sister   . Pulmonary embolism Other   . Cancer Other   . Cancer Brother 60    angiosarcoma of the chest; carcinoid small intestinal tumor  . Hypertension Brother   . Cancer Maternal Aunt     breast cancer  . Cancer Maternal Grandfather 16    pancreatic    ADVANCED DIRECTIVES:  Patient does have advance healthcare directive, Patient   does not desire to make any changes  HEALTH MAINTENANCE: Social History  Substance Use Topics  . Smoking status: Never Smoker   . Smokeless tobacco: Never Used  . Alcohol Use: No      Allergies  Allergen Reactions  . Carafate [Sucralfate] Rash  . Sulfa Antibiotics Rash    Current Outpatient Prescriptions  Medication Sig Dispense Refill  . acetaminophen (TYLENOL) 500 MG chewable tablet Chew 500 mg by mouth every 6 (six) hours as needed for pain.    Marland Kitchen levothyroxine (SYNTHROID, LEVOTHROID) 50 MCG tablet TAKE 1 TABLET EVERY DAY 30 tablet 12  . lidocaine-prilocaine (EMLA) cream APPLY TO AFFECTED AREA IF NEEDED 30 g 1  . LORazepam (ATIVAN) 0.5 MG tablet Take 1 tablet (0.5 mg total) by mouth at bedtime. 30 tablet 3  . mometasone (ELOCON) 0.1 % cream Apply 1 application topically daily. Apply to affected area no more than 2 weeks a month. 45 g 0  . pantoprazole (PROTONIX) 40 MG tablet TAKE 1 TABLET EVERY DAY 30 tablet 12  . polyethylene glycol (MIRALAX / GLYCOLAX) packet Take 17 g by mouth daily.    . propafenone (RYTHMOL) 225 MG tablet Take 225 mg by mouth 2 (two) times daily.     No current facility-administered medications for this visit.   Facility-Administered Medications Ordered in Other Visits  Medication Dose Route Frequency Provider Last Rate Last Dose  . sodium chloride 0.9 % injection 10 mL  10 mL Intracatheter PRN Forest Gleason, MD   10 mL at 02/07/15 0930    OBJECTIVE:  There were no vitals filed for this visit.   There is no  weight on file to calculate BMI.    ECOG FS:1 - Symptomatic but completely ambulatory  PHYSICAL EXAM: GENERAL:  Well developed, well nourished, sitting comfortably in the exam room in no acute distress. MENTAL STATUS:  Alert and oriented to person, place and time. HEAD:   ALOPECIA  Normocephalic, atraumatic, face symmetric, no Cushingoid features. EYES:  Pupils equal round and reactive to light and accomodation.  No conjunctivitis or scleral icterus. ENT:  Oropharynx clear without lesion.  Tongue normal. Mucous membranes moist.  RESPIRATORY:  Clear to auscultation without rales, wheezes or rhonchi. CARDIOVASCULAR:  Regular rate and rhythm without murmur, rub or gallop. BREAST:  Right breast without masses, skin changes or nipple discharge.  Left breast without masses, skin changes or nipple discharge. ABDOMEN:  Soft, non-tender, with active bowel sounds, and no hepatosplenomegaly.  No masses. BACK:  No CVA tenderness.  No tenderness on percussion of the back or rib cage. SKIN:  No rashes, ulcers or lesions. EXTREMITIES: No edema, no skin discoloration or tenderness.  No palpable cords. LYMPH NODES: No palpable cervical, supraclavicular, axillary or inguinal adenopathy  NEUROLOGICAL: Unremarkable. PSYCH:  Appropriate.   LAB RESULTS:  No visits with results within 2 Day(s) from this visit. Latest known visit with results is:  Hospital Outpatient Visit on 01/10/2016  Component Date Value Ref Range Status  . Creatinine, Ser 01/10/2016 1.00  0.44 - 1.00 mg/dL Final    CA 125 (June 05, 2015) 14.9 ASSESSMENT: OVARIAN  cancer stage III He should not has finished total 6 cycles of chemotherapy  Patient does have ATM genetic mutation. As recommended patient's cancer risk for breast cancer is 17% to 52% Currently there are no specific recommendation except for routine mammogram breast self-examination. Increased chance for pancreatic cancer and there is no specific  recommendation. T CA 125 is  24.4  All other lab data has been reviewed there are within acceptable range   On clinical examination there is no evidence of recurrent disease   2.Neuropathy persists.  If it is bothering her too much then Neurontin would be advised.   MEDICAL DECISION MAKING:  CT scan has been reviewed independently shows multiple nodules suggestive of recurrent disease.  I had prolonged discussion with patient and family and Dr. Theora Gianotti  Plan is for possibility of going on protocol at [facility]  Or Consider possibility of Doxil   AND carboplatin the patient has partial complete response then consider maintenance with zejula .  All this option has been discussed with the patient family and in my absence patient will be evaluated by Dr. B particularly if she decides not to go with protocol therapy otherwise patient will be referred to [facility] below consideration of investigational trial.  Patient desires to affect and delivery of her daughter in next few weeks.  And will call us regarding her decision  She is aware of my planned retirement.  Total duration of visit was 25  minutes.  50% or more time was spent in counseling patient and family regarding prognosis and options of treatment and available resources   Staging form: Ovary, AJCC 7th Edition     Clinical: Stage IIIC (T3c, N0, M0) - Marni Griffon, MD   01/24/2016 3:50 PM

## 2016-01-25 ENCOUNTER — Inpatient Hospital Stay: Payer: PPO | Attending: Internal Medicine

## 2016-01-25 ENCOUNTER — Other Ambulatory Visit: Payer: Self-pay | Admitting: *Deleted

## 2016-01-25 DIAGNOSIS — E785 Hyperlipidemia, unspecified: Secondary | ICD-10-CM | POA: Diagnosis not present

## 2016-01-25 DIAGNOSIS — C569 Malignant neoplasm of unspecified ovary: Secondary | ICD-10-CM | POA: Insufficient documentation

## 2016-01-25 DIAGNOSIS — Z9221 Personal history of antineoplastic chemotherapy: Secondary | ICD-10-CM | POA: Insufficient documentation

## 2016-01-25 DIAGNOSIS — Z853 Personal history of malignant neoplasm of breast: Secondary | ICD-10-CM | POA: Insufficient documentation

## 2016-01-25 DIAGNOSIS — I4891 Unspecified atrial fibrillation: Secondary | ICD-10-CM | POA: Diagnosis not present

## 2016-01-25 DIAGNOSIS — Z452 Encounter for adjustment and management of vascular access device: Secondary | ICD-10-CM | POA: Insufficient documentation

## 2016-01-25 DIAGNOSIS — Z803 Family history of malignant neoplasm of breast: Secondary | ICD-10-CM | POA: Diagnosis not present

## 2016-01-25 DIAGNOSIS — K219 Gastro-esophageal reflux disease without esophagitis: Secondary | ICD-10-CM | POA: Diagnosis not present

## 2016-01-25 DIAGNOSIS — Z800 Family history of malignant neoplasm of digestive organs: Secondary | ICD-10-CM | POA: Diagnosis not present

## 2016-01-25 DIAGNOSIS — Z8543 Personal history of malignant neoplasm of ovary: Secondary | ICD-10-CM | POA: Diagnosis not present

## 2016-01-25 DIAGNOSIS — C786 Secondary malignant neoplasm of retroperitoneum and peritoneum: Secondary | ICD-10-CM | POA: Insufficient documentation

## 2016-01-25 DIAGNOSIS — I493 Ventricular premature depolarization: Secondary | ICD-10-CM | POA: Diagnosis not present

## 2016-01-25 DIAGNOSIS — Z79899 Other long term (current) drug therapy: Secondary | ICD-10-CM | POA: Insufficient documentation

## 2016-01-25 DIAGNOSIS — E039 Hypothyroidism, unspecified: Secondary | ICD-10-CM | POA: Insufficient documentation

## 2016-01-25 DIAGNOSIS — Z95828 Presence of other vascular implants and grafts: Secondary | ICD-10-CM

## 2016-01-25 MED ORDER — HEPARIN SOD (PORK) LOCK FLUSH 100 UNIT/ML IV SOLN
500.0000 [IU] | Freq: Once | INTRAVENOUS | Status: AC
  Administered 2016-01-25: 500 [IU] via INTRAVENOUS

## 2016-01-25 MED ORDER — SODIUM CHLORIDE 0.9% FLUSH
10.0000 mL | INTRAVENOUS | Status: DC | PRN
  Administered 2016-01-25: 10 mL via INTRAVENOUS
  Filled 2016-01-25: qty 10

## 2016-01-26 ENCOUNTER — Encounter: Payer: Self-pay | Admitting: *Deleted

## 2016-01-26 ENCOUNTER — Telehealth: Payer: Self-pay

## 2016-01-26 ENCOUNTER — Other Ambulatory Visit: Payer: Self-pay | Admitting: Oncology

## 2016-01-26 NOTE — Telephone Encounter (Signed)
  Oncology Nurse Navigator Documentation  Navigator Location: [facility] Onc (01/26/16 1000) Navigator Encounter Type: Telephone;Follow-up Appt (01/26/16 1000) Telephone: Outgoing Call;Appt Confirmation/Clarification (01/26/16 1000)                                        Time Spent with Patient: 15 (01/26/16 1000)   Ms Neimeyer wants to arrange appt with Dr Theora Gianotti to discuss the clinical trial. Appt arranged for 6/28 at 10:30. Read back performed.

## 2016-01-26 NOTE — Progress Notes (Signed)
Pt came to cancer center yesterday for port flush and requested to see RN for Dr. Rogue Bussing.  I spent approximately 25 minutes talking to this patient and collaborating her care with Dr. Metro Kung team.  Pt ordinally schedule at the middle of July with Dr. Rogue Bussing. She asked to speak to me yesterday about making a sooner apt with Dr. Jacinto Reap.  Mrs. Rhinehart is tx by Dr. Oliva Bustard for ovarian cancer.  @ last apt Dr. Oliva Bustard discussed her re-occurence and tx options. He discussed starting doxil/carbo vs a clinical trial at [facility]. Pt informed me that she has decided to declined the clinical trial option. She also wanted to let Dr. Theora Gianotti personally know that she declined the trial. I updated Kristi in gyn navigation of patient's request.  She now would like to plan schedule the doxil/carbo at the end of June. She states that the end of June would work better since her daughter is getting ready to deliver (any day). She expressed concern that she wanted to be present at her grand baby's birth. "I just don't want to have any bad side effects that would keep me going to see my new grand baby."  I spoke with Dr. Oliva Bustard. We will go ahead and plan a muga scan sometime next week.  Dr. Oliva Bustard asked that I order the muga scan under Dr. Aletha Halim name so that Dr. B would get the results. The Muga was sch. on Tuesday next week at [facility]. Pt given this apt.. The patient said she wanted to meet Dr. Jacinto Reap next Thursday to review the results before scheduling the chemo.  She was concerned about her current cardiac health. She said she is already seeing Dr. Nehemiah Massed for afib.  For this reason, she said she was 'nervous' of starting the doxil. It would make her 'feel better' to get the muga scan results and meet Dr. B before scheduling the chemo date.

## 2016-01-31 ENCOUNTER — Ambulatory Visit
Admission: RE | Admit: 2016-01-31 | Discharge: 2016-01-31 | Disposition: A | Discharge status: Home / Self Care | Payer: PPO | Source: Ambulatory Visit | Attending: Internal Medicine | Admitting: Internal Medicine

## 2016-01-31 DIAGNOSIS — C569 Malignant neoplasm of unspecified ovary: Secondary | ICD-10-CM | POA: Diagnosis not present

## 2016-01-31 DIAGNOSIS — Z0189 Encounter for other specified special examinations: Secondary | ICD-10-CM | POA: Diagnosis not present

## 2016-01-31 MED ORDER — TECHNETIUM TC 99M-LABELED RED BLOOD CELLS IV KIT
20.0000 | PACK | Freq: Once | INTRAVENOUS | Status: AC | PRN
  Administered 2016-01-31: 22.4 via INTRAVENOUS

## 2016-02-01 ENCOUNTER — Other Ambulatory Visit: Payer: PPO

## 2016-02-01 ENCOUNTER — Ambulatory Visit: Payer: PPO | Admitting: Internal Medicine

## 2016-02-01 ENCOUNTER — Ambulatory Visit: Payer: PPO

## 2016-02-02 ENCOUNTER — Ambulatory Visit: Payer: PPO

## 2016-02-02 ENCOUNTER — Inpatient Hospital Stay: Payer: PPO

## 2016-02-02 ENCOUNTER — Inpatient Hospital Stay (HOSPITAL_BASED_OUTPATIENT_CLINIC_OR_DEPARTMENT_OTHER): Payer: PPO | Admitting: Internal Medicine

## 2016-02-02 VITALS — BP 124/74 | HR 73 | Temp 96.7°F | Resp 18 | Wt 180.6 lb

## 2016-02-02 DIAGNOSIS — K219 Gastro-esophageal reflux disease without esophagitis: Secondary | ICD-10-CM

## 2016-02-02 DIAGNOSIS — C569 Malignant neoplasm of unspecified ovary: Secondary | ICD-10-CM

## 2016-02-02 DIAGNOSIS — E039 Hypothyroidism, unspecified: Secondary | ICD-10-CM

## 2016-02-02 DIAGNOSIS — I493 Ventricular premature depolarization: Secondary | ICD-10-CM

## 2016-02-02 DIAGNOSIS — Z8543 Personal history of malignant neoplasm of ovary: Secondary | ICD-10-CM

## 2016-02-02 DIAGNOSIS — Z79899 Other long term (current) drug therapy: Secondary | ICD-10-CM

## 2016-02-02 DIAGNOSIS — C786 Secondary malignant neoplasm of retroperitoneum and peritoneum: Secondary | ICD-10-CM

## 2016-02-02 DIAGNOSIS — Z853 Personal history of malignant neoplasm of breast: Secondary | ICD-10-CM

## 2016-02-02 DIAGNOSIS — Z9221 Personal history of antineoplastic chemotherapy: Secondary | ICD-10-CM

## 2016-02-02 DIAGNOSIS — I4891 Unspecified atrial fibrillation: Secondary | ICD-10-CM

## 2016-02-02 DIAGNOSIS — E785 Hyperlipidemia, unspecified: Secondary | ICD-10-CM

## 2016-02-02 DIAGNOSIS — Z5181 Encounter for therapeutic drug level monitoring: Secondary | ICD-10-CM | POA: Insufficient documentation

## 2016-02-02 LAB — COMPREHENSIVE METABOLIC PANEL
ALBUMIN: 3.9 g/dL (ref 3.5–5.0)
ALK PHOS: 69 U/L (ref 38–126)
ALT: 13 U/L — AB (ref 14–54)
AST: 18 U/L (ref 15–41)
Anion gap: 8 (ref 5–15)
BUN: 14 mg/dL (ref 6–20)
CALCIUM: 9.4 mg/dL (ref 8.9–10.3)
CO2: 27 mmol/L (ref 22–32)
CREATININE: 0.97 mg/dL (ref 0.44–1.00)
Chloride: 104 mmol/L (ref 101–111)
GFR calc non Af Amer: 57 mL/min — ABNORMAL LOW (ref >60)
GLUCOSE: 82 mg/dL (ref 65–99)
Potassium: 3.7 mmol/L (ref 3.5–5.1)
SODIUM: 139 mmol/L (ref 135–145)
Total Bilirubin: 0.7 mg/dL (ref 0.3–1.2)
Total Protein: 7.1 g/dL (ref 6.5–8.1)

## 2016-02-02 LAB — CBC WITH DIFFERENTIAL/PLATELET
BASOS ABS: 0 10*3/uL (ref 0–0.1)
Basophils Relative: 1 %
EOS ABS: 0.1 10*3/uL (ref 0–0.7)
Eosinophils Relative: 2 %
HCT: 44 % (ref 35.0–47.0)
HEMOGLOBIN: 14.9 g/dL (ref 12.0–16.0)
LYMPHS ABS: 2.2 10*3/uL (ref 1.0–3.6)
Lymphocytes Relative: 45 %
MCH: 31.2 pg (ref 26.0–34.0)
MCHC: 33.9 g/dL (ref 32.0–36.0)
MCV: 92.1 fL (ref 80.0–100.0)
Monocytes Absolute: 0.6 10*3/uL (ref 0.2–0.9)
Monocytes Relative: 12 %
NEUTROS PCT: 42 %
Neutro Abs: 2.1 10*3/uL (ref 1.4–6.5)
Platelets: 226 10*3/uL (ref 150–440)
RBC: 4.78 MIL/uL (ref 3.80–5.20)
RDW: 13.4 % (ref 11.5–14.5)
WBC: 5 10*3/uL (ref 3.6–11.0)

## 2016-02-02 NOTE — Progress Notes (Signed)
[place] OFFICE PROGRESS NOTE  Patient Care Team: Jerrol Banana., MD as PCP - General (Family Medicine) [facility] Gaetana Michaelis, MD as Referring Physician (Obstetrics and Gynecology) Robert Bellow, MD as Consulting Physician (General Surgery)  Ovarian cancer [facility])   Staging form: Ovary, AJCC 7th Edition     Clinical: Stage IIIC (T3c, N0, M0) - Unsigned    Oncology History   # April- MAY 2016- OVARIAN CANCER STAGE IIIC; CA-125 +2300+;  s/p OPTIMAL DEBULKING SURGERY   # MAY 2016- CARBO-TAXOL DD  # MAY CT 2017- RECURRENT/Peritoneal carcinomatosis/pelvic implant ~1.2cm/Ca-125-34  # LEFT BREAST CA s/p Lumpec & RT s/p TAM   # Afib [cardiology]; JUNE 2017- MUGA 62%  MOLECULAR TESTING- ATM genetic mutation      Ovarian cancer ([place])   12/16/2014 Initial Diagnosis Ovarian cancer    INTERVAL HISTORY:  This is my first interaction with the patient as patient's primary oncologist has been Dr.Choksi. I reviewed the patient's prior charts/pertinent labs/imaging in detail; findings are summarized above.   75 year old female patient with above history of ovarian cancer recurrent currently is here for follow-up. Patient is noted to have recurrent/progressive malignancy- 4 she is recommended carboplatin and Doxil. However she is concerned about the potential side effects of Doxil-especially to her cardiac history.  Patient's appetite is good. Denies any abdominal pain. Denies abdominal distention. No chest pain or shortness of breath or cough. She is looking forward- to her grandbaby's birth in the next few weeks. She was to hold off treatment until the delivery.  REVIEW OF SYSTEMS:  A complete 10 point review of system is done which is negative except mentioned above/history of present illness.   PAST MEDICAL HISTORY :  Past Medical History  Diagnosis Date  . Atrial fibrillation ([place])   . Hyperlipidemia   . PVC (premature ventricular contraction)   . Mitral  insufficiency   . GERD (gastroesophageal reflux disease)   . Hypothyroidism   . Cancer of breast ([place]) 1992    Left  . Ovarian cancer [facility])     s/p BSO optimal tumor debulking May 2016    PAST SURGICAL HISTORY :   Past Surgical History  Procedure Laterality Date  . Cesarean section      x2  . Abdominal hysterectomy    . Cholecystectomy    . Colonoscopy  2006  . Breast surgery      Left  . Laparotomy with staging N/A 12/22/2014    Procedure: LAPAROTOMY WITH STAGING;  Surgeon: Gillis Ends, MD;  Location: [facility] ORS;  Service: Gynecology;  Laterality: N/A;  . Salpingoophorectomy Bilateral 12/22/2014    Procedure: SALPINGO OOPHORECTOMY;  Surgeon: Gillis Ends, MD;  Location: [facility] ORS;  Service: Gynecology;  Laterality: Bilateral;  . Debulking N/A 12/22/2014    Procedure: DEBULKING;  Surgeon: Gillis Ends, MD;  Location: [facility] ORS;  Service: Gynecology;  Laterality: N/A;  . Laparotomy N/A 12/22/2014    Procedure: EXPLORATORY LAPAROTOMY;  Surgeon: Robert Bellow, MD;  Location: [facility] ORS;  Service: General;  Laterality: N/A;  . Portacath placement Right 12/22/2014    Procedure: INSERTION PORT-A-CATH;  Surgeon: Robert Bellow, MD;  Location: [facility] ORS;  Service: General;  Laterality: Right;  . Peripheral vascular catheterization Left 12/22/2014    Procedure: PORTA CATH INSERTION;  Surgeon: Robert Bellow, MD;  Location: [facility] ORS;  Service: General;  Laterality: Left;  . Bilateral salpingoophorectomy  May 2016    with optimal tumor debulking   .  Upper gi endoscopy  09/25/04    hiatus hernia, schatzki ring and a single gastric polyp    FAMILY HISTORY :   Family History  Problem Relation Age of Onset  . Cancer Mother     breast, throat, and stomach  . Breast cancer Mother   . Heart disease Father   . Pulmonary embolism Sister   . Pulmonary embolism Other   . Cancer Other   . Cancer Brother 60    angiosarcoma of the chest; carcinoid small intestinal tumor  .  Hypertension Brother   . Cancer Maternal Aunt     breast cancer  . Cancer Maternal Grandfather 82    pancreatic    SOCIAL HISTORY:   Social History  Substance Use Topics  . Smoking status: Never Smoker   . Smokeless tobacco: Never Used  . Alcohol Use: No    ALLERGIES:  is allergic to carafate and sulfa antibiotics.  MEDICATIONS:  Current Outpatient Prescriptions  Medication Sig Dispense Refill  . acetaminophen (TYLENOL) 500 MG chewable tablet Chew 500 mg by mouth every 6 (six) hours as needed for pain.    Marland Kitchen levothyroxine (SYNTHROID, LEVOTHROID) 50 MCG tablet TAKE 1 TABLET EVERY DAY 30 tablet 12  . lidocaine-prilocaine (EMLA) cream APPLY TO AFFECTED AREA IF NEEDED 30 g 1  . LORazepam (ATIVAN) 0.5 MG tablet Take 1 tablet (0.5 mg total) by mouth at bedtime. 30 tablet 3  . mometasone (ELOCON) 0.1 % cream Apply 1 application topically daily. Apply to affected area no more than 2 weeks a month. 45 g 0  . pantoprazole (PROTONIX) 40 MG tablet TAKE 1 TABLET EVERY DAY 30 tablet 12  . polyethylene glycol (MIRALAX / GLYCOLAX) packet Take 17 g by mouth daily.    . propafenone (RYTHMOL) 225 MG tablet Take 225 mg by mouth 2 (two) times daily.     No current facility-administered medications for this visit.   Facility-Administered Medications Ordered in Other Visits  Medication Dose Route Frequency Provider Last Rate Last Dose  . sodium chloride 0.9 % injection 10 mL  10 mL Intracatheter PRN Forest Gleason, MD   10 mL at 02/07/15 0930    PHYSICAL EXAMINATION: ECOG PERFORMANCE STATUS: 0 - Asymptomatic  BP 124/74 mmHg  Pulse 73  Temp(Src) 96.7 F (35.9 C) (Tympanic)  Resp 18  Wt 180 lb 8.9 oz (81.9 kg)  Filed Weights   02/02/16 0934  Weight: 180 lb 8.9 oz (81.9 kg)    GENERAL: Well-nourished well-developed; Alert, no distress and comfortAccompanied by her husband. EYES: no pallor or icterus OROPHARYNX: no thrush or ulceration; good dentition  NECK: supple, no masses felt LYMPH:  no  palpable lymphadenopathy in the cervical, axillary or inguinal regions LUNGS: clear to auscultation and  No wheeze or crackles HEART/CVS: regular rate & rhythm and no murmurs; No lower extremity edema ABDOMEN:abdomen soft, non-tender and normal bowel sounds Musculoskeletal:no cyanosis of digits and no clubbing  PSYCH: alert & oriented x 3 with fluent speech NEURO: no focal motor/sensory deficits SKIN:  no rashes or significant lesions  LABORATORY DATA:  I have reviewed the data as listed    Component Value Date/Time   NA 139 02/02/2016 0919   NA 140 12/14/2014 1037   NA 143 03/16/2014   K 3.7 02/02/2016 0919   K 4.4 12/14/2014 1037   CL 104 02/02/2016 0919   CL 106 12/14/2014 1037   CO2 27 02/02/2016 0919   CO2 26 12/14/2014 1037   GLUCOSE 82 02/02/2016 0919  GLUCOSE 87 12/14/2014 1037   BUN 14 02/02/2016 0919   BUN 12 12/14/2014 1037   BUN 14 03/16/2014   CREATININE 0.97 02/02/2016 0919   CREATININE 0.97 12/14/2014 1037   CREATININE 1.1 03/16/2014   CALCIUM 9.4 02/02/2016 0919   CALCIUM 9.2 12/14/2014 1037   PROT 7.1 02/02/2016 0919   PROT 7.3 12/14/2014 1037   ALBUMIN 3.9 02/02/2016 0919   ALBUMIN 3.8 12/14/2014 1037   AST 18 02/02/2016 0919   AST 20 12/14/2014 1037   ALT 13* 02/02/2016 0919   ALT 11* 12/14/2014 1037   ALKPHOS 69 02/02/2016 0919   ALKPHOS 138* 12/14/2014 1037   BILITOT 0.7 02/02/2016 0919   BILITOT 0.5 12/14/2014 1037   GFRNONAA 57* 02/02/2016 0919   GFRNONAA 59* 12/14/2014 1037   GFRAA >60 02/02/2016 0919   GFRAA >60 12/14/2014 1037    No results found for: SPEP, UPEP  Lab Results  Component Value Date   WBC 5.0 02/02/2016   NEUTROABS 2.1 02/02/2016   HGB 14.9 02/02/2016   HCT 44.0 02/02/2016   MCV 92.1 02/02/2016   PLT 226 02/02/2016      Chemistry      Component Value Date/Time   NA 139 02/02/2016 0919   NA 140 12/14/2014 1037   NA 143 03/16/2014   K 3.7 02/02/2016 0919   K 4.4 12/14/2014 1037   CL 104 02/02/2016 0919   CL  106 12/14/2014 1037   CO2 27 02/02/2016 0919   CO2 26 12/14/2014 1037   BUN 14 02/02/2016 0919   BUN 12 12/14/2014 1037   BUN 14 03/16/2014   CREATININE 0.97 02/02/2016 0919   CREATININE 0.97 12/14/2014 1037   CREATININE 1.1 03/16/2014   GLU 84 03/16/2014      Component Value Date/Time   CALCIUM 9.4 02/02/2016 0919   CALCIUM 9.2 12/14/2014 1037   ALKPHOS 69 02/02/2016 0919   ALKPHOS 138* 12/14/2014 1037   AST 18 02/02/2016 0919   AST 20 12/14/2014 1037   ALT 13* 02/02/2016 0919   ALT 11* 12/14/2014 1037   BILITOT 0.7 02/02/2016 0919   BILITOT 0.5 12/14/2014 1037       RADIOGRAPHIC STUDIES: I have personally reviewed the radiological images as listed and agreed with the findings in the report. No results found.   ASSESSMENT & PLAN:  Ovarian cancer ([place]) Recurrent ovarian cancer- patient declined clinical trial at [facility]. Recommend Doxil- carbo q 4 weeks; start in 4 weeks as per pt wishes.   Reviewed the side effects of Doxil especially cardiac- recent MUGA scan showed 66% ejection fraction.   Growth factor-Neulasta/On pro would be given as prophylaxis for chemotherapy-induced neutropenia to prevent febrile neutropenias. Discussed the potential side effects including but not limited to-increasing fatigue, nausea vomiting, diarrhea, hair loss, sores in the mouth, increase risk of infection and also neuropathy.   After lengthy discussion patient agreed to proceed with chemotherapy- in approximately 4 weeks.       Orders Placed This Encounter  Procedures  . Comprehensive metabolic panel    Standing Status: Future     Number of Occurrences:      Standing Expiration Date: 02/01/2017    Order Specific Question:  Has the patient fasted?    Answer:  No  . CBC with Differential    Standing Status: Future     Number of Occurrences:      Standing Expiration Date: 02/01/2017   All questions were answered. The patient knows to call the clinic with  any problems, questions or  concerns. Patient will follow-up with me in approximately 4 weeks to start chemotherapy.      Cammie Sickle, MD 02/04/2016 10:42 PM

## 2016-02-02 NOTE — Assessment & Plan Note (Addendum)
Recurrent ovarian cancer- patient declined clinical trial at [facility]. Recommend Doxil- carbo q 4 weeks; start in 3 weeks as per pt wishes.   Reviewed the side effects of Doxil especially cardiac- recent MUGA scan showed 66% ejection fraction.   Growth factor-Neulasta/On pro would be given as prophylaxis for chemotherapy-induced neutropenia to prevent febrile neutropenias. Discussed the potential side effects including but not limited to-increasing fatigue, nausea vomiting, diarrhea, hair loss, sores in the mouth, increase risk of infection and also neuropathy.   After lengthy discussion patient agreed to proceed with chemotherapy- in approximately 3 weeks.

## 2016-02-15 ENCOUNTER — Ambulatory Visit: Payer: PPO

## 2016-02-15 ENCOUNTER — Telehealth: Payer: Self-pay | Admitting: *Deleted

## 2016-02-15 ENCOUNTER — Other Ambulatory Visit: Payer: Self-pay | Admitting: Internal Medicine

## 2016-02-15 NOTE — Telephone Encounter (Signed)
Patient called stating her grandchild is here.  Patient scheduled for chemo 02-27-16, however, will be available as early as next week to begin.  Please call patient to let her know if her appointment can be changed and she can start therapy sooner.

## 2016-02-15 NOTE — Telephone Encounter (Signed)
Per Dr. Rogue Bussing ok to change chemotherapy date to sooner apts.

## 2016-02-20 ENCOUNTER — Inpatient Hospital Stay: Payer: PPO | Attending: Internal Medicine

## 2016-02-20 ENCOUNTER — Inpatient Hospital Stay (HOSPITAL_BASED_OUTPATIENT_CLINIC_OR_DEPARTMENT_OTHER): Payer: PPO | Admitting: Internal Medicine

## 2016-02-20 ENCOUNTER — Telehealth: Payer: Self-pay | Admitting: *Deleted

## 2016-02-20 ENCOUNTER — Inpatient Hospital Stay: Payer: PPO

## 2016-02-20 VITALS — BP 122/67 | HR 66 | Temp 96.5°F | Resp 18 | Wt 181.0 lb

## 2016-02-20 DIAGNOSIS — E039 Hypothyroidism, unspecified: Secondary | ICD-10-CM | POA: Insufficient documentation

## 2016-02-20 DIAGNOSIS — Z800 Family history of malignant neoplasm of digestive organs: Secondary | ICD-10-CM | POA: Diagnosis not present

## 2016-02-20 DIAGNOSIS — I493 Ventricular premature depolarization: Secondary | ICD-10-CM | POA: Insufficient documentation

## 2016-02-20 DIAGNOSIS — Z853 Personal history of malignant neoplasm of breast: Secondary | ICD-10-CM | POA: Diagnosis not present

## 2016-02-20 DIAGNOSIS — Z803 Family history of malignant neoplasm of breast: Secondary | ICD-10-CM | POA: Diagnosis not present

## 2016-02-20 DIAGNOSIS — Z7689 Persons encountering health services in other specified circumstances: Secondary | ICD-10-CM

## 2016-02-20 DIAGNOSIS — I340 Nonrheumatic mitral (valve) insufficiency: Secondary | ICD-10-CM | POA: Diagnosis not present

## 2016-02-20 DIAGNOSIS — E785 Hyperlipidemia, unspecified: Secondary | ICD-10-CM

## 2016-02-20 DIAGNOSIS — C569 Malignant neoplasm of unspecified ovary: Secondary | ICD-10-CM | POA: Diagnosis not present

## 2016-02-20 DIAGNOSIS — Z79899 Other long term (current) drug therapy: Secondary | ICD-10-CM

## 2016-02-20 DIAGNOSIS — C786 Secondary malignant neoplasm of retroperitoneum and peritoneum: Secondary | ICD-10-CM | POA: Diagnosis not present

## 2016-02-20 DIAGNOSIS — K219 Gastro-esophageal reflux disease without esophagitis: Secondary | ICD-10-CM

## 2016-02-20 DIAGNOSIS — Z5111 Encounter for antineoplastic chemotherapy: Secondary | ICD-10-CM | POA: Insufficient documentation

## 2016-02-20 DIAGNOSIS — I4891 Unspecified atrial fibrillation: Secondary | ICD-10-CM | POA: Diagnosis not present

## 2016-02-20 LAB — COMPREHENSIVE METABOLIC PANEL
ALK PHOS: 63 U/L (ref 38–126)
ALT: 14 U/L (ref 14–54)
ANION GAP: 4 — AB (ref 5–15)
AST: 20 U/L (ref 15–41)
Albumin: 3.7 g/dL (ref 3.5–5.0)
BUN: 15 mg/dL (ref 6–20)
CALCIUM: 9 mg/dL (ref 8.9–10.3)
CO2: 26 mmol/L (ref 22–32)
Chloride: 108 mmol/L (ref 101–111)
Creatinine, Ser: 0.73 mg/dL (ref 0.44–1.00)
GFR calc non Af Amer: >60 mL/min (ref >60)
Glucose, Bld: 104 mg/dL — ABNORMAL HIGH (ref 65–99)
POTASSIUM: 3.8 mmol/L (ref 3.5–5.1)
SODIUM: 138 mmol/L (ref 135–145)
Total Bilirubin: 0.5 mg/dL (ref 0.3–1.2)
Total Protein: 6.6 g/dL (ref 6.5–8.1)

## 2016-02-20 LAB — CBC WITH DIFFERENTIAL/PLATELET
BASOS ABS: 0 10*3/uL (ref 0–0.1)
BASOS PCT: 1 %
Eosinophils Absolute: 0.1 10*3/uL (ref 0–0.7)
Eosinophils Relative: 2 %
HEMATOCRIT: 40.6 % (ref 35.0–47.0)
HEMOGLOBIN: 13.9 g/dL (ref 12.0–16.0)
LYMPHS PCT: 44 %
Lymphs Abs: 2.2 10*3/uL (ref 1.0–3.6)
MCH: 31.4 pg (ref 26.0–34.0)
MCHC: 34.1 g/dL (ref 32.0–36.0)
MCV: 92.1 fL (ref 80.0–100.0)
MONOS PCT: 11 %
Monocytes Absolute: 0.6 10*3/uL (ref 0.2–0.9)
NEUTROS ABS: 2.1 10*3/uL (ref 1.4–6.5)
NEUTROS PCT: 42 %
Platelets: 225 10*3/uL (ref 150–440)
RBC: 4.41 MIL/uL (ref 3.80–5.20)
RDW: 13.4 % (ref 11.5–14.5)
WBC: 5 10*3/uL (ref 3.6–11.0)

## 2016-02-20 MED ORDER — DEXTROSE 5 % IV SOLN
Freq: Once | INTRAVENOUS | Status: AC
  Administered 2016-02-20: 11:00:00 via INTRAVENOUS
  Filled 2016-02-20: qty 1000

## 2016-02-20 MED ORDER — HEPARIN SOD (PORK) LOCK FLUSH 100 UNIT/ML IV SOLN
500.0000 [IU] | Freq: Once | INTRAVENOUS | Status: AC | PRN
  Administered 2016-02-20: 500 [IU]
  Filled 2016-02-20: qty 5

## 2016-02-20 MED ORDER — PEGFILGRASTIM 6 MG/0.6ML ~~LOC~~ PSKT
6.0000 mg | PREFILLED_SYRINGE | Freq: Once | SUBCUTANEOUS | Status: AC
  Administered 2016-02-20: 6 mg via SUBCUTANEOUS
  Filled 2016-02-20: qty 0.6

## 2016-02-20 MED ORDER — SODIUM CHLORIDE 0.9 % IV SOLN
455.5000 mg | Freq: Once | INTRAVENOUS | Status: AC
  Administered 2016-02-20: 460 mg via INTRAVENOUS
  Filled 2016-02-20: qty 46

## 2016-02-20 MED ORDER — PALONOSETRON HCL INJECTION 0.25 MG/5ML
0.2500 mg | Freq: Once | INTRAVENOUS | Status: AC
  Administered 2016-02-20: 0.25 mg via INTRAVENOUS
  Filled 2016-02-20: qty 5

## 2016-02-20 MED ORDER — SODIUM CHLORIDE 0.9% FLUSH
10.0000 mL | INTRAVENOUS | Status: DC | PRN
  Administered 2016-02-20: 10 mL
  Filled 2016-02-20: qty 10

## 2016-02-20 MED ORDER — SODIUM CHLORIDE 0.9 % IV SOLN
10.0000 mg | Freq: Once | INTRAVENOUS | Status: AC
  Administered 2016-02-20: 10 mg via INTRAVENOUS
  Filled 2016-02-20: qty 1

## 2016-02-20 MED ORDER — DOXORUBICIN HCL LIPOSOMAL CHEMO INJECTION 2 MG/ML
30.0000 mg/m2 | Freq: Once | INTRAVENOUS | Status: AC
  Administered 2016-02-20: 58 mg via INTRAVENOUS
  Filled 2016-02-20: qty 25

## 2016-02-20 NOTE — Progress Notes (Signed)
[place] OFFICE PROGRESS NOTE  Patient Care Team: Jerrol Banana., MD as PCP - General (Family Medicine) [facility] Gaetana Michaelis, MD as Referring Physician (Obstetrics and Gynecology) Robert Bellow, MD as Consulting Physician (General Surgery)  Ovarian cancer [facility])   Staging form: Ovary, AJCC 7th Edition     Clinical: Stage IIIC (T3c, N0, M0) - Unsigned    Oncology History   # April- MAY 2016- OVARIAN CANCER STAGE IIIC; CA-125 +2300+;  s/p OPTIMAL DEBULKING SURGERY   # MAY 2016- CARBO-TAXOL DD  # MAY CT 2017- RECURRENT/Peritoneal carcinomatosis/pelvic implant ~1.2cm/Ca-125-34; July 3rd- CARBO-DOXIL q 4W [with neulasta]  # LEFT BREAST CA s/p Lumpec & RT s/p TAM   # Afib [cardiology]; JUNE 2017- MUGA 62%  MOLECULAR TESTING- ATM genetic mutation      Ovarian cancer ([place])   12/16/2014 Initial Diagnosis Ovarian cancer    INTERVAL HISTORY:  75 year old female patient with above history of ovarian cancer recurrent currently is here for follow-up. Patient is noted to have recurrent/progressive malignancy- 4 she is recommended carboplatin and Doxil. Patient reluctantly agreed to chemotherapy.   She recently had a grand baby; she wants to proceed with chemotherapy.   Patient's appetite is good. Denies any abdominal pain. Denies abdominal distention. No chest pain or shortness of breath or cough. No nausea no vomiting.  REVIEW OF SYSTEMS:  A complete 10 point review of system is done which is negative except mentioned above/history of present illness.   PAST MEDICAL HISTORY :  Past Medical History  Diagnosis Date  . Atrial fibrillation ([place])   . Hyperlipidemia   . PVC (premature ventricular contraction)   . Mitral insufficiency   . GERD (gastroesophageal reflux disease)   . Hypothyroidism   . Cancer of breast ([place]) 1992    Left  . Ovarian cancer [facility])     s/p BSO optimal tumor debulking May 2016    PAST SURGICAL HISTORY :   Past Surgical History   Procedure Laterality Date  . Cesarean section      x2  . Abdominal hysterectomy    . Cholecystectomy    . Colonoscopy  2006  . Breast surgery      Left  . Laparotomy with staging N/A 12/22/2014    Procedure: LAPAROTOMY WITH STAGING;  Surgeon: Gillis Ends, MD;  Location: [facility] ORS;  Service: Gynecology;  Laterality: N/A;  . Salpingoophorectomy Bilateral 12/22/2014    Procedure: SALPINGO OOPHORECTOMY;  Surgeon: Gillis Ends, MD;  Location: [facility] ORS;  Service: Gynecology;  Laterality: Bilateral;  . Debulking N/A 12/22/2014    Procedure: DEBULKING;  Surgeon: Gillis Ends, MD;  Location: [facility] ORS;  Service: Gynecology;  Laterality: N/A;  . Laparotomy N/A 12/22/2014    Procedure: EXPLORATORY LAPAROTOMY;  Surgeon: Robert Bellow, MD;  Location: [facility] ORS;  Service: General;  Laterality: N/A;  . Portacath placement Right 12/22/2014    Procedure: INSERTION PORT-A-CATH;  Surgeon: Robert Bellow, MD;  Location: [facility] ORS;  Service: General;  Laterality: Right;  . Peripheral vascular catheterization Left 12/22/2014    Procedure: PORTA CATH INSERTION;  Surgeon: Robert Bellow, MD;  Location: [facility] ORS;  Service: General;  Laterality: Left;  . Bilateral salpingoophorectomy  May 2016    with optimal tumor debulking   . Upper gi endoscopy  09/25/04    hiatus hernia, schatzki ring and a single gastric polyp    FAMILY HISTORY :   Family History  Problem Relation Age of Onset  .  Cancer Mother     breast, throat, and stomach  . Breast cancer Mother   . Heart disease Father   . Pulmonary embolism Sister   . Pulmonary embolism Other   . Cancer Other   . Cancer Brother 60    angiosarcoma of the chest; carcinoid small intestinal tumor  . Hypertension Brother   . Cancer Maternal Aunt     breast cancer  . Cancer Maternal Grandfather 8    pancreatic    SOCIAL HISTORY:   Social History  Substance Use Topics  . Smoking status: Never Smoker   . Smokeless tobacco: Never  Used  . Alcohol Use: No    ALLERGIES:  is allergic to carafate and sulfa antibiotics.  MEDICATIONS:  Current Outpatient Prescriptions  Medication Sig Dispense Refill  . acetaminophen (TYLENOL) 500 MG chewable tablet Chew 500 mg by mouth every 6 (six) hours as needed for pain.    Marland Kitchen levothyroxine (SYNTHROID, LEVOTHROID) 50 MCG tablet TAKE 1 TABLET EVERY DAY 30 tablet 12  . lidocaine-prilocaine (EMLA) cream APPLY TO AFFECTED AREA IF NEEDED 30 g 1  . LORazepam (ATIVAN) 0.5 MG tablet Take 1 tablet (0.5 mg total) by mouth at bedtime. 30 tablet 3  . mometasone (ELOCON) 0.1 % cream Apply 1 application topically daily. Apply to affected area no more than 2 weeks a month. 45 g 0  . pantoprazole (PROTONIX) 40 MG tablet TAKE 1 TABLET EVERY DAY 30 tablet 12  . polyethylene glycol (MIRALAX / GLYCOLAX) packet Take 17 g by mouth daily.    . propafenone (RYTHMOL) 225 MG tablet Take 225 mg by mouth 2 (two) times daily.    . ondansetron (ZOFRAN) 4 MG tablet Take 4 mg by mouth every 6 (six) hours as needed for nausea or vomiting. Reported on 02/20/2016     No current facility-administered medications for this visit.   Facility-Administered Medications Ordered in Other Visits  Medication Dose Route Frequency Provider Last Rate Last Dose  . sodium chloride 0.9 % injection 10 mL  10 mL Intracatheter PRN Forest Gleason, MD   10 mL at 02/07/15 0930  . sodium chloride flush (NS) 0.9 % injection 10 mL  10 mL Intracatheter PRN Forest Gleason, MD   10 mL at 02/20/16 1000    PHYSICAL EXAMINATION: ECOG PERFORMANCE STATUS: 0 - Asymptomatic  BP 122/67 mmHg  Pulse 66  Temp(Src) 96.5 F (35.8 C) (Tympanic)  Resp 18  Wt 181 lb (82.1 kg)  Filed Weights   02/20/16 1025  Weight: 181 lb (82.1 kg)    GENERAL: Well-nourished well-developed; Alert, no distress and comfortAccompanied by her husband. EYES: no pallor or icterus OROPHARYNX: no thrush or ulceration; good dentition  NECK: supple, no masses felt LYMPH:  no  palpable lymphadenopathy in the cervical, axillary or inguinal regions LUNGS: clear to auscultation and  No wheeze or crackles HEART/CVS: regular rate & rhythm and no murmurs; No lower extremity edema ABDOMEN:abdomen soft, non-tender and normal bowel sounds Musculoskeletal:no cyanosis of digits and no clubbing  PSYCH: alert & oriented x 3 with fluent speech NEURO: no focal motor/sensory deficits SKIN:  no rashes or significant lesions  LABORATORY DATA:  I have reviewed the data as listed    Component Value Date/Time   NA 138 02/20/2016 0958   NA 140 12/14/2014 1037   NA 143 03/16/2014   K 3.8 02/20/2016 0958   K 4.4 12/14/2014 1037   CL 108 02/20/2016 0958   CL 106 12/14/2014 1037   CO2 26  02/20/2016 0958   CO2 26 12/14/2014 1037   GLUCOSE 104* 02/20/2016 0958   GLUCOSE 87 12/14/2014 1037   BUN 15 02/20/2016 0958   BUN 12 12/14/2014 1037   BUN 14 03/16/2014   CREATININE 0.73 02/20/2016 0958   CREATININE 0.97 12/14/2014 1037   CREATININE 1.1 03/16/2014   CALCIUM 9.0 02/20/2016 0958   CALCIUM 9.2 12/14/2014 1037   PROT 6.6 02/20/2016 0958   PROT 7.3 12/14/2014 1037   ALBUMIN 3.7 02/20/2016 0958   ALBUMIN 3.8 12/14/2014 1037   AST 20 02/20/2016 0958   AST 20 12/14/2014 1037   ALT 14 02/20/2016 0958   ALT 11* 12/14/2014 1037   ALKPHOS 63 02/20/2016 0958   ALKPHOS 138* 12/14/2014 1037   BILITOT 0.5 02/20/2016 0958   BILITOT 0.5 12/14/2014 1037   GFRNONAA >60 02/20/2016 0958   GFRNONAA 59* 12/14/2014 1037   GFRAA >60 02/20/2016 0958   GFRAA >60 12/14/2014 1037    No results found for: SPEP, UPEP  Lab Results  Component Value Date   WBC 5.0 02/20/2016   NEUTROABS 2.1 02/20/2016   HGB 13.9 02/20/2016   HCT 40.6 02/20/2016   MCV 92.1 02/20/2016   PLT 225 02/20/2016      Chemistry      Component Value Date/Time   NA 138 02/20/2016 0958   NA 140 12/14/2014 1037   NA 143 03/16/2014   K 3.8 02/20/2016 0958   K 4.4 12/14/2014 1037   CL 108 02/20/2016 0958   CL  106 12/14/2014 1037   CO2 26 02/20/2016 0958   CO2 26 12/14/2014 1037   BUN 15 02/20/2016 0958   BUN 12 12/14/2014 1037   BUN 14 03/16/2014   CREATININE 0.73 02/20/2016 0958   CREATININE 0.97 12/14/2014 1037   CREATININE 1.1 03/16/2014   GLU 84 03/16/2014      Component Value Date/Time   CALCIUM 9.0 02/20/2016 0958   CALCIUM 9.2 12/14/2014 1037   ALKPHOS 63 02/20/2016 0958   ALKPHOS 138* 12/14/2014 1037   AST 20 02/20/2016 0958   AST 20 12/14/2014 1037   ALT 14 02/20/2016 0958   ALT 11* 12/14/2014 1037   BILITOT 0.5 02/20/2016 0958   BILITOT 0.5 12/14/2014 1037       RADIOGRAPHIC STUDIES: I have personally reviewed the radiological images as listed and agreed with the findings in the report. No results found.   ASSESSMENT & PLAN:  Ovarian cancer ([place]) Recurrent ovarian cancer- patient declined clinical trial at [facility]; platinum sensitive. Recommend Doxil- carbo q 4 weeks-starting today. Discussed the potential side effects- recent MUGA scan within normal limits. We will plan to get imaging after 2 or 3 cycles.  # Discussed re: body pain with neulasta- recommendClaritin once a day.   # follow up in 2 weeks/ labs/ chemo again 4 weeks/labs/ ca-125.   # If patient has good response from therapy; she could potentially be a candidate for [place] maintenance post treatment.     Orders Placed This Encounter  Procedures  . CBC with Differential    Standing Status: Future     Number of Occurrences:      Standing Expiration Date: 02/19/2017  . Basic metabolic panel    Standing Status: Future     Number of Occurrences:      Standing Expiration Date: 02/19/2017    Order Specific Question:  Has the patient fasted?    Answer:  No  . CBC with Differential    Standing Status: Future  Number of Occurrences:      Standing Expiration Date: 02/19/2017  . Comprehensive metabolic panel    Standing Status: Future     Number of Occurrences:      Standing Expiration Date: 02/19/2017     Order Specific Question:  Has the patient fasted?    Answer:  No  . CA 125    Standing Status: Future     Number of Occurrences:      Standing Expiration Date: 02/19/2017   All questions were answered. The patient knows to call the clinic with any problems, questions or concerns. Patient will follow-up with me in approximately 4 weeks to start chemotherapy.      Cammie Sickle, MD 02/20/2016 6:03 PM

## 2016-02-20 NOTE — Patient Instructions (Signed)
Doxorubicin Liposomal injection  What is this medicine?  LIPOSOMAL DOXORUBICIN (LIP oh som al dox oh ROO bi sin) is a chemotherapy drug. This medicine is used to treat many kinds of cancer like Kaposi's sarcoma, multiple myeloma, and ovarian cancer.  This medicine may be used for other purposes; ask your health care provider or pharmacist if you have questions.  What should I tell my health care provider before I take this medicine?  They need to know if you have any of these conditions:  -blood disorders  -heart disease  -infection (especially a virus infection such as chickenpox, cold sores, or herpes)  -liver disease  -recent or ongoing radiation therapy  -an unusual or allergic reaction to doxorubicin, other chemotherapy agents, soybeans, other medicines, foods, dyes, or preservatives  -pregnant or trying to get pregnant  -breast-feeding  How should I use this medicine?  This drug is given as an infusion into a vein. It is administered in a hospital or clinic by a specially trained health care professional. If you have pain, swelling, burning or any unusual feeling around the site of your injection, tell your health care professional right away.  Talk to your pediatrician regarding the use of this medicine in children. Special care may be needed.  Overdosage: If you think you have taken too much of this medicine contact a poison control center or emergency room at once.  NOTE: This medicine is only for you. Do not share this medicine with others.  What if I miss a dose?  It is important not to miss your dose. Call your doctor or health care professional if you are unable to keep an appointment.  What may interact with this medicine?  Do not take this medicine with any of the following medications:  -zidovudine  This medicine may also interact with the following medications:  -medicines to increase blood counts like filgrastim, pegfilgrastim, sargramostim  -vaccines  Talk to your doctor or health care  professional before taking any of these medicines:  -acetaminophen  -aspirin  -ibuprofen  -ketoprofen  -naproxen  This list may not describe all possible interactions. Give your health care provider a list of all the medicines, herbs, non-prescription drugs, or dietary supplements you use. Also tell them if you smoke, drink alcohol, or use illegal drugs. Some items may interact with your medicine.  What should I watch for while using this medicine?  Your condition will be monitored carefully while you are receiving this medicine. You will need important blood work done while you are taking this medicine.  This drug may make you feel generally unwell. This is not uncommon, as chemotherapy can affect healthy cells as well as cancer cells. Report any side effects. Continue your course of treatment even though you feel ill unless your doctor tells you to stop.  Your urine may turn orange-red for a few days after your dose. This is not blood. If your urine is dark or brown, call your doctor.  In some cases, you may be given additional medicines to help with side effects. Follow all directions for their use.  Call your doctor or health care professional for advice if you get a fever (100.5 degrees F or higher), chills or sore throat, or other symptoms of a cold or flu. Do not treat yourself. This drug decreases your body's ability to fight infections. Try to avoid being around people who are sick.  This medicine may increase your risk to bruise or bleed. Call your doctor or   you are receiving this medicine. Avoid taking products that contain aspirin, acetaminophen, ibuprofen, naproxen, or ketoprofen unless instructed by your doctor. These medicines may hide a fever. Men and women of  childbearing age should use effective birth control methods while using taking this medicine. Do not become pregnant while taking this medicine. There is a potential for serious side effects to an unborn child. Talk to your health care professional or pharmacist for more information. Do not breast-feed an infant while taking this medicine. Talk to your doctor about your risk of cancer. You may be more at risk for certain types of cancers if you take this medicine. What side effects may I notice from receiving this medicine? Side effects that you should report to your doctor or health care professional as soon as possible: -allergic reactions like skin rash, itching or hives, swelling of the face, lips, or tongue -low blood counts - this medicine may decrease the number of white blood cells, red blood cells and platelets. You may be at increased risk for infections and bleeding. -signs of hand-foot syndrome - tingling or burning, redness, flaking, swelling, small blisters, or small sores on the palms of your hands or the soles of your feet -signs of infection - fever or chills, cough, sore throat, pain or difficulty passing urine -signs of decreased platelets or bleeding - bruising, pinpoint red spots on the skin, black, tarry stools, blood in the urine -signs of decreased red blood cells - unusually weak or tired, fainting spells, lightheadedness -back pain, chills, facial flushing, fever, headache, tightness in the chest or throat during the infusion -breathing problems -chest pain -fast, irregular heartbeat -mouth pain, redness, sores -pain, swelling, redness at site where injected -pain, tingling, numbness in the hands or feet -swelling of ankles, feet, or hands -vomiting Side effects that usually do not require medical attention (report to your doctor or health care professional if they continue or are bothersome): -diarrhea -hair loss -loss of appetite -nail discoloration or  damage -nausea -red or watery eyes -red colored urine -stomach upset This list may not describe all possible side effects. Call your doctor for medical advice about side effects. You may report side effects to FDA at 1-800-FDA-1088. Where should I keep my medicine? This drug is given in a hospital or clinic and will not be stored at home. NOTE: This sheet is a summary. It may not cover all possible information. If you have questions about this medicine, talk to your doctor, pharmacist, or health care provider.    2016, [employer]vier/Gold Standard. (2012-04-25 10:12:56)

## 2016-02-20 NOTE — Progress Notes (Signed)
Patient requesting refill for Zofran to have on hand just in case she will need it.

## 2016-02-20 NOTE — Assessment & Plan Note (Addendum)
Recurrent ovarian cancer- patient declined clinical trial at [facility]; platinum sensitive. Recommend Doxil- carbo q 4 weeks-starting today. Discussed the potential side effects- recent MUGA scan within normal limits. We will plan to get imaging after 2 or 3 cycles.  # Discussed re: body pain with neulasta- recommendClaritin once a day.   # follow up in 2 weeks/ labs/ chemo again 4 weeks/labs/ ca-125.   # If patient has good response from therapy; she could potentially be a candidate for Zejula maintenance post treatment.

## 2016-02-21 LAB — CA 125: CA 125: 47.5 U/mL — ABNORMAL HIGH (ref 0.0–38.1)

## 2016-02-22 ENCOUNTER — Ambulatory Visit: Payer: PPO

## 2016-02-22 ENCOUNTER — Other Ambulatory Visit: Payer: PPO

## 2016-02-22 ENCOUNTER — Ambulatory Visit: Payer: PPO | Admitting: Internal Medicine

## 2016-02-23 ENCOUNTER — Telehealth: Payer: Self-pay

## 2016-02-23 ENCOUNTER — Telehealth: Payer: Self-pay | Admitting: *Deleted

## 2016-02-23 DIAGNOSIS — K5900 Constipation, unspecified: Secondary | ICD-10-CM

## 2016-02-23 MED ORDER — LACTULOSE 10 GM/15ML PO SOLN: 10.0000 g | Freq: Three times a day (TID) | ORAL | Status: DC

## 2016-02-23 NOTE — Telephone Encounter (Signed)
-----   Message from Shawnee Knapp, RN sent at 02/23/2016  9:54 AM EDT ----- Regarding: CONSTIPATION Patient c/o severe constipation.  Has not had a BM since Monday.  Has taken Miralax and stool softener everyday.  Please advise.

## 2016-02-23 NOTE — Telephone Encounter (Signed)
Notified patient that prescription for Lactulose had been sent to her pharmacy.  Provided instructions for use.  Patient told to call back if constipation continues.

## 2016-02-23 NOTE — Telephone Encounter (Signed)
Spoke with MD. Continue use of colace.  MD will prescribe lactulose.  Erline Levine, RN to contact pt with md recommendations.

## 2016-02-27 ENCOUNTER — Ambulatory Visit: Payer: PPO | Admitting: Internal Medicine

## 2016-02-27 ENCOUNTER — Other Ambulatory Visit: Payer: PPO

## 2016-02-27 ENCOUNTER — Ambulatory Visit: Payer: PPO

## 2016-03-01 NOTE — Telephone Encounter (Signed)
Entered in error

## 2016-03-05 ENCOUNTER — Inpatient Hospital Stay: Payer: PPO

## 2016-03-05 ENCOUNTER — Inpatient Hospital Stay (HOSPITAL_BASED_OUTPATIENT_CLINIC_OR_DEPARTMENT_OTHER): Payer: PPO | Admitting: Oncology

## 2016-03-05 VITALS — BP 138/78 | HR 69 | Temp 98.1°F | Resp 20 | Wt 180.3 lb

## 2016-03-05 DIAGNOSIS — C569 Malignant neoplasm of unspecified ovary: Secondary | ICD-10-CM

## 2016-03-05 DIAGNOSIS — I340 Nonrheumatic mitral (valve) insufficiency: Secondary | ICD-10-CM

## 2016-03-05 DIAGNOSIS — Z7689 Persons encountering health services in other specified circumstances: Secondary | ICD-10-CM

## 2016-03-05 DIAGNOSIS — Z853 Personal history of malignant neoplasm of breast: Secondary | ICD-10-CM | POA: Diagnosis not present

## 2016-03-05 DIAGNOSIS — I4891 Unspecified atrial fibrillation: Secondary | ICD-10-CM

## 2016-03-05 DIAGNOSIS — Z800 Family history of malignant neoplasm of digestive organs: Secondary | ICD-10-CM

## 2016-03-05 DIAGNOSIS — Z79899 Other long term (current) drug therapy: Secondary | ICD-10-CM

## 2016-03-05 DIAGNOSIS — K219 Gastro-esophageal reflux disease without esophagitis: Secondary | ICD-10-CM

## 2016-03-05 DIAGNOSIS — E785 Hyperlipidemia, unspecified: Secondary | ICD-10-CM

## 2016-03-05 DIAGNOSIS — C786 Secondary malignant neoplasm of retroperitoneum and peritoneum: Secondary | ICD-10-CM | POA: Diagnosis not present

## 2016-03-05 DIAGNOSIS — E039 Hypothyroidism, unspecified: Secondary | ICD-10-CM

## 2016-03-05 DIAGNOSIS — Z803 Family history of malignant neoplasm of breast: Secondary | ICD-10-CM

## 2016-03-05 DIAGNOSIS — Z5111 Encounter for antineoplastic chemotherapy: Secondary | ICD-10-CM | POA: Diagnosis not present

## 2016-03-05 DIAGNOSIS — I493 Ventricular premature depolarization: Secondary | ICD-10-CM

## 2016-03-05 LAB — BASIC METABOLIC PANEL
ANION GAP: 5 (ref 5–15)
BUN: 13 mg/dL (ref 6–20)
CHLORIDE: 104 mmol/L (ref 101–111)
CO2: 27 mmol/L (ref 22–32)
Calcium: 9.1 mg/dL (ref 8.9–10.3)
Creatinine, Ser: 0.78 mg/dL (ref 0.44–1.00)
GFR calc Af Amer: >60 mL/min (ref >60)
GLUCOSE: 98 mg/dL (ref 65–99)
POTASSIUM: 4.1 mmol/L (ref 3.5–5.1)
Sodium: 136 mmol/L (ref 135–145)

## 2016-03-05 LAB — CBC WITH DIFFERENTIAL/PLATELET
BASOS ABS: 0 10*3/uL (ref 0–0.1)
Basophils Relative: 1 %
EOS PCT: 0 %
Eosinophils Absolute: 0 10*3/uL (ref 0–0.7)
HEMATOCRIT: 36.9 % (ref 35.0–47.0)
Hemoglobin: 12.7 g/dL (ref 12.0–16.0)
LYMPHS ABS: 3.1 10*3/uL (ref 1.0–3.6)
LYMPHS PCT: 56 %
MCH: 31.7 pg (ref 26.0–34.0)
MCHC: 34.3 g/dL (ref 32.0–36.0)
MCV: 92.5 fL (ref 80.0–100.0)
Monocytes Absolute: 0.4 10*3/uL (ref 0.2–0.9)
Monocytes Relative: 8 %
NEUTROS ABS: 2 10*3/uL (ref 1.4–6.5)
NEUTROS PCT: 35 %
PLATELETS: 186 10*3/uL (ref 150–440)
RBC: 3.99 MIL/uL (ref 3.80–5.20)
RDW: 13.4 % (ref 11.5–14.5)
WBC: 5.5 10*3/uL (ref 3.6–11.0)

## 2016-03-05 NOTE — Progress Notes (Signed)
[place] OFFICE PROGRESS NOTE  Patient Care Team: Jerrol Banana., MD as PCP - General (Family Medicine) [facility] Gaetana Michaelis, MD as Referring Physician (Obstetrics and Gynecology) Robert Bellow, MD as Consulting Physician (General Surgery)  Ovarian cancer [facility])   Staging form: Ovary, AJCC 7th Edition     Clinical: Stage IIIC (T3c, N0, M0) - Unsigned    Oncology History   # April- MAY 2016- OVARIAN CANCER STAGE IIIC; CA-125 +2300+;  s/p OPTIMAL DEBULKING SURGERY   # MAY 2016- CARBO-TAXOL DD  # MAY CT 2017- RECURRENT/Peritoneal carcinomatosis/pelvic implant ~1.2cm/Ca-125-34; July 3rd- CARBO-DOXIL q 4W [with neulasta]  # LEFT BREAST CA s/p Lumpec & RT s/p TAM   # Afib [cardiology]; JUNE 2017- MUGA 62%  MOLECULAR TESTING- ATM genetic mutation      Ovarian cancer ([place])   12/16/2014 Initial Diagnosis Ovarian cancer    INTERVAL HISTORY:  75 year old female patient with above history of ovarian cancer recurrent currently is here for follow-up. Patient is noted to have recurrent/progressive malignancy- 4 she is currently being treated with carboplatin and Doxil.  She feels well today and is without complaints. She did have an issue with constipation but that has improved with lactulose. Patient's appetite is good. Denies any abdominal pain. Denies abdominal distention. No chest pain or shortness of breath or cough. No nausea no vomiting.  REVIEW OF SYSTEMS:  A complete 10 point review of system is done which is negative except mentioned above/history of present illness.   PAST MEDICAL HISTORY :  Past Medical History  Diagnosis Date  . Atrial fibrillation ([place])   . Hyperlipidemia   . PVC (premature ventricular contraction)   . Mitral insufficiency   . GERD (gastroesophageal reflux disease)   . Hypothyroidism   . Cancer of breast ([place]) 1992    Left  . Ovarian cancer [facility])     s/p BSO optimal tumor debulking May 2016    PAST SURGICAL HISTORY :    Past Surgical History  Procedure Laterality Date  . Cesarean section      x2  . Abdominal hysterectomy    . Cholecystectomy    . Colonoscopy  2006  . Breast surgery      Left  . Laparotomy with staging N/A 12/22/2014    Procedure: LAPAROTOMY WITH STAGING;  Surgeon: Gillis Ends, MD;  Location: [facility] ORS;  Service: Gynecology;  Laterality: N/A;  . Salpingoophorectomy Bilateral 12/22/2014    Procedure: SALPINGO OOPHORECTOMY;  Surgeon: Gillis Ends, MD;  Location: [facility] ORS;  Service: Gynecology;  Laterality: Bilateral;  . Debulking N/A 12/22/2014    Procedure: DEBULKING;  Surgeon: Gillis Ends, MD;  Location: [facility] ORS;  Service: Gynecology;  Laterality: N/A;  . Laparotomy N/A 12/22/2014    Procedure: EXPLORATORY LAPAROTOMY;  Surgeon: Robert Bellow, MD;  Location: [facility] ORS;  Service: General;  Laterality: N/A;  . Portacath placement Right 12/22/2014    Procedure: INSERTION PORT-A-CATH;  Surgeon: Robert Bellow, MD;  Location: [facility] ORS;  Service: General;  Laterality: Right;  . Peripheral vascular catheterization Left 12/22/2014    Procedure: PORTA CATH INSERTION;  Surgeon: Robert Bellow, MD;  Location: [facility] ORS;  Service: General;  Laterality: Left;  . Bilateral salpingoophorectomy  May 2016    with optimal tumor debulking   . Upper gi endoscopy  09/25/04    hiatus hernia, schatzki ring and a single gastric polyp    FAMILY HISTORY :   Family History  Problem Relation Age  of Onset  . Cancer Mother     breast, throat, and stomach  . Breast cancer Mother   . Heart disease Father   . Pulmonary embolism Sister   . Pulmonary embolism Other   . Cancer Other   . Cancer Brother 60    angiosarcoma of the chest; carcinoid small intestinal tumor  . Hypertension Brother   . Cancer Maternal Aunt     breast cancer  . Cancer Maternal Grandfather 43    pancreatic    SOCIAL HISTORY:   Social History  Substance Use Topics  . Smoking status: Never Smoker   .  Smokeless tobacco: Never Used  . Alcohol Use: No    ALLERGIES:  is allergic to carafate and sulfa antibiotics.  MEDICATIONS:  Current Outpatient Prescriptions  Medication Sig Dispense Refill  . acetaminophen (TYLENOL) 500 MG chewable tablet Chew 500 mg by mouth every 6 (six) hours as needed for pain.    Marland Kitchen levothyroxine (SYNTHROID, LEVOTHROID) 50 MCG tablet TAKE 1 TABLET EVERY DAY 30 tablet 12  . lidocaine-prilocaine (EMLA) cream APPLY TO AFFECTED AREA IF NEEDED 30 g 1  . LORazepam (ATIVAN) 0.5 MG tablet Take 1 tablet (0.5 mg total) by mouth at bedtime. 30 tablet 3  . mometasone (ELOCON) 0.1 % cream Apply 1 application topically daily. Apply to affected area no more than 2 weeks a month. 45 g 0  . ondansetron (ZOFRAN) 4 MG tablet Take 4 mg by mouth every 6 (six) hours as needed for nausea or vomiting. Reported on 02/20/2016    . pantoprazole (PROTONIX) 40 MG tablet TAKE 1 TABLET EVERY DAY 30 tablet 12  . polyethylene glycol (MIRALAX / GLYCOLAX) packet Take 17 g by mouth daily.    . propafenone (RYTHMOL) 225 MG tablet Take 225 mg by mouth 2 (two) times daily.    . psyllium (METAMUCIL) 58.6 % powder Take 1 packet by mouth daily.    Marland Kitchen senna (SENOKOT) 8.6 MG TABS tablet Take 2 tablets by mouth daily.    Marland Kitchen lactulose (CHRONULAC) 10 GM/15ML solution Take 15 mLs (10 g total) by mouth 3 (three) times daily. (Patient not taking: Reported on 03/05/2016) 240 mL 0   No current facility-administered medications for this visit.   Facility-Administered Medications Ordered in Other Visits  Medication Dose Route Frequency Provider Last Rate Last Dose  . sodium chloride 0.9 % injection 10 mL  10 mL Intracatheter PRN Forest Gleason, MD   10 mL at 02/07/15 0930    PHYSICAL EXAMINATION: ECOG PERFORMANCE STATUS: 0 - Asymptomatic  BP 138/78 mmHg  Pulse 69  Temp(Src) 98.1 F (36.7 C) (Oral)  Resp 20  Wt 180 lb 5.4 oz (81.8 kg)  Filed Weights   03/05/16 1605  Weight: 180 lb 5.4 oz (81.8 kg)    GENERAL:  Well-nourished well-developed; Alert, no distress and comfort Accompanied by her husband. EYES: no pallor or icterus OROPHARYNX: no thrush or ulceration; good dentition  NECK: supple, no masses felt LYMPH:  no palpable lymphadenopathy in the cervical, axillary or inguinal regions LUNGS: clear to auscultation and  No wheeze or crackles HEART/CVS: regular rate & rhythm and no murmurs; No lower extremity edema ABDOMEN:abdomen soft, non-tender and normal bowel sounds Musculoskeletal:no cyanosis of digits and no clubbing  PSYCH: alert & oriented x 3 with fluent speech NEURO: no focal motor/sensory deficits SKIN:  no rashes or significant lesions  LABORATORY DATA:  I have reviewed the data as listed    Component Value Date/Time   NA  136 03/05/2016 1504   NA 140 12/14/2014 1037   NA 143 03/16/2014   K 4.1 03/05/2016 1504   K 4.4 12/14/2014 1037   CL 104 03/05/2016 1504   CL 106 12/14/2014 1037   CO2 27 03/05/2016 1504   CO2 26 12/14/2014 1037   GLUCOSE 98 03/05/2016 1504   GLUCOSE 87 12/14/2014 1037   BUN 13 03/05/2016 1504   BUN 12 12/14/2014 1037   BUN 14 03/16/2014   CREATININE 0.78 03/05/2016 1504   CREATININE 0.97 12/14/2014 1037   CREATININE 1.1 03/16/2014   CALCIUM 9.1 03/05/2016 1504   CALCIUM 9.2 12/14/2014 1037   PROT 6.6 02/20/2016 0958   PROT 7.3 12/14/2014 1037   ALBUMIN 3.7 02/20/2016 0958   ALBUMIN 3.8 12/14/2014 1037   AST 20 02/20/2016 0958   AST 20 12/14/2014 1037   ALT 14 02/20/2016 0958   ALT 11* 12/14/2014 1037   ALKPHOS 63 02/20/2016 0958   ALKPHOS 138* 12/14/2014 1037   BILITOT 0.5 02/20/2016 0958   BILITOT 0.5 12/14/2014 1037   GFRNONAA >60 03/05/2016 1504   GFRNONAA 59* 12/14/2014 1037   GFRAA >60 03/05/2016 1504   GFRAA >60 12/14/2014 1037    No results found for: SPEP, UPEP  Lab Results  Component Value Date   WBC 5.5 03/05/2016   NEUTROABS 2.0 03/05/2016   HGB 12.7 03/05/2016   HCT 36.9 03/05/2016   MCV 92.5 03/05/2016   PLT 186  03/05/2016      Chemistry      Component Value Date/Time   NA 136 03/05/2016 1504   NA 140 12/14/2014 1037   NA 143 03/16/2014   K 4.1 03/05/2016 1504   K 4.4 12/14/2014 1037   CL 104 03/05/2016 1504   CL 106 12/14/2014 1037   CO2 27 03/05/2016 1504   CO2 26 12/14/2014 1037   BUN 13 03/05/2016 1504   BUN 12 12/14/2014 1037   BUN 14 03/16/2014   CREATININE 0.78 03/05/2016 1504   CREATININE 0.97 12/14/2014 1037   CREATININE 1.1 03/16/2014   GLU 84 03/16/2014      Component Value Date/Time   CALCIUM 9.1 03/05/2016 1504   CALCIUM 9.2 12/14/2014 1037   ALKPHOS 63 02/20/2016 0958   ALKPHOS 138* 12/14/2014 1037   AST 20 02/20/2016 0958   AST 20 12/14/2014 1037   ALT 14 02/20/2016 0958   ALT 11* 12/14/2014 1037   BILITOT 0.5 02/20/2016 0958   BILITOT 0.5 12/14/2014 1037       RADIOGRAPHIC STUDIES:. No results found.   ASSESSMENT & PLAN:   1. Recurrent ovarian cancer- patient declined clinical trial at [facility] Continue Doxil- carbo q 4 weeks; plan imaging after 2 or 3 cycles.  Recent MUGA scan showed 66% ejection fraction. Patient receiving neulasta - tolerating well with Claritin. CBC/CMP WNL today. Keep appointment for Dr B and chemotherapy on March 21, 2016.  All questions were answered. The patient knows to call the clinic with any problems, questions or concerns. Patient will follow-up with me in approximately 4 weeks to start chemotherapy.    Dr. Rogue Bussing was available for consultation and review of plan of care for this patient.   Mayra Reel, NP 03/05/2016 5:20 PM

## 2016-03-05 NOTE — Progress Notes (Signed)
Patient reports constipation is improved. Has not taken lactulose in a couple of days. Working with Miralax, senna and metamucil. Nausea improved. Appetite is improved. Patient denies dizziness.

## 2016-03-13 ENCOUNTER — Other Ambulatory Visit: Payer: PPO

## 2016-03-15 ENCOUNTER — Ambulatory Visit: Payer: PPO | Admitting: Internal Medicine

## 2016-03-15 ENCOUNTER — Other Ambulatory Visit: Payer: PPO

## 2016-03-21 ENCOUNTER — Inpatient Hospital Stay: Payer: PPO

## 2016-03-21 ENCOUNTER — Inpatient Hospital Stay: Payer: PPO | Attending: Internal Medicine | Admitting: Internal Medicine

## 2016-03-21 VITALS — BP 116/79 | HR 76 | Temp 97.7°F | Resp 18 | Wt 182.0 lb

## 2016-03-21 DIAGNOSIS — M79642 Pain in left hand: Secondary | ICD-10-CM | POA: Diagnosis not present

## 2016-03-21 DIAGNOSIS — E785 Hyperlipidemia, unspecified: Secondary | ICD-10-CM | POA: Diagnosis not present

## 2016-03-21 DIAGNOSIS — R530 Neoplastic (malignant) related fatigue: Secondary | ICD-10-CM | POA: Diagnosis not present

## 2016-03-21 DIAGNOSIS — E039 Hypothyroidism, unspecified: Secondary | ICD-10-CM

## 2016-03-21 DIAGNOSIS — Z853 Personal history of malignant neoplasm of breast: Secondary | ICD-10-CM

## 2016-03-21 DIAGNOSIS — C569 Malignant neoplasm of unspecified ovary: Secondary | ICD-10-CM | POA: Diagnosis not present

## 2016-03-21 DIAGNOSIS — M79673 Pain in unspecified foot: Secondary | ICD-10-CM | POA: Insufficient documentation

## 2016-03-21 DIAGNOSIS — Z803 Family history of malignant neoplasm of breast: Secondary | ICD-10-CM | POA: Diagnosis not present

## 2016-03-21 DIAGNOSIS — M79641 Pain in right hand: Secondary | ICD-10-CM | POA: Diagnosis not present

## 2016-03-21 DIAGNOSIS — R110 Nausea: Secondary | ICD-10-CM | POA: Diagnosis not present

## 2016-03-21 DIAGNOSIS — Z808 Family history of malignant neoplasm of other organs or systems: Secondary | ICD-10-CM

## 2016-03-21 DIAGNOSIS — I493 Ventricular premature depolarization: Secondary | ICD-10-CM | POA: Diagnosis not present

## 2016-03-21 DIAGNOSIS — I340 Nonrheumatic mitral (valve) insufficiency: Secondary | ICD-10-CM | POA: Diagnosis not present

## 2016-03-21 DIAGNOSIS — Z800 Family history of malignant neoplasm of digestive organs: Secondary | ICD-10-CM

## 2016-03-21 DIAGNOSIS — Z5111 Encounter for antineoplastic chemotherapy: Secondary | ICD-10-CM | POA: Insufficient documentation

## 2016-03-21 DIAGNOSIS — Z7689 Persons encountering health services in other specified circumstances: Secondary | ICD-10-CM

## 2016-03-21 DIAGNOSIS — K5900 Constipation, unspecified: Secondary | ICD-10-CM | POA: Diagnosis not present

## 2016-03-21 DIAGNOSIS — I4891 Unspecified atrial fibrillation: Secondary | ICD-10-CM

## 2016-03-21 DIAGNOSIS — Z79899 Other long term (current) drug therapy: Secondary | ICD-10-CM

## 2016-03-21 LAB — COMPREHENSIVE METABOLIC PANEL
ALBUMIN: 3.8 g/dL (ref 3.5–5.0)
ALK PHOS: 72 U/L (ref 38–126)
ALT: 14 U/L (ref 14–54)
ANION GAP: 6 (ref 5–15)
AST: 22 U/L (ref 15–41)
BUN: 17 mg/dL (ref 6–20)
CALCIUM: 8.9 mg/dL (ref 8.9–10.3)
CHLORIDE: 105 mmol/L (ref 101–111)
CO2: 26 mmol/L (ref 22–32)
CREATININE: 1.03 mg/dL — AB (ref 0.44–1.00)
GFR calc Af Amer: >60 mL/min (ref >60)
GFR calc non Af Amer: 53 mL/min — ABNORMAL LOW (ref >60)
GLUCOSE: 98 mg/dL (ref 65–99)
Potassium: 3.6 mmol/L (ref 3.5–5.1)
SODIUM: 137 mmol/L (ref 135–145)
Total Bilirubin: 0.6 mg/dL (ref 0.3–1.2)
Total Protein: 6.7 g/dL (ref 6.5–8.1)

## 2016-03-21 LAB — CBC WITH DIFFERENTIAL/PLATELET
BASOS PCT: 1 %
Basophils Absolute: 0.1 10*3/uL (ref 0–0.1)
EOS ABS: 0 10*3/uL (ref 0–0.7)
Eosinophils Relative: 0 %
HCT: 39 % (ref 35.0–47.0)
HEMOGLOBIN: 13.4 g/dL (ref 12.0–16.0)
Lymphocytes Relative: 45 %
Lymphs Abs: 2.3 10*3/uL (ref 1.0–3.6)
MCH: 32.2 pg (ref 26.0–34.0)
MCHC: 34.3 g/dL (ref 32.0–36.0)
MCV: 93.8 fL (ref 80.0–100.0)
Monocytes Absolute: 0.7 10*3/uL (ref 0.2–0.9)
Monocytes Relative: 14 %
NEUTROS PCT: 40 %
Neutro Abs: 2.1 10*3/uL (ref 1.4–6.5)
PLATELETS: 263 10*3/uL (ref 150–440)
RBC: 4.16 MIL/uL (ref 3.80–5.20)
RDW: 15.1 % — ABNORMAL HIGH (ref 11.5–14.5)
WBC: 5.2 10*3/uL (ref 3.6–11.0)

## 2016-03-21 MED ORDER — LACTULOSE 10 GM/15ML PO SOLN: 10.0000 g | Freq: Three times a day (TID) | ORAL | 0 refills | Status: DC

## 2016-03-21 MED ORDER — PEGFILGRASTIM 6 MG/0.6ML ~~LOC~~ PSKT
6.0000 mg | PREFILLED_SYRINGE | Freq: Once | SUBCUTANEOUS | Status: AC
Start: 2016-03-21 — End: 2016-03-21
  Administered 2016-03-21: 6 mg via SUBCUTANEOUS
  Filled 2016-03-21: qty 0.6

## 2016-03-21 MED ORDER — DEXTROSE 5 % IV SOLN
Freq: Once | INTRAVENOUS | Status: AC
  Administered 2016-03-21: 11:00:00 via INTRAVENOUS
  Filled 2016-03-21: qty 1000

## 2016-03-21 MED ORDER — SODIUM CHLORIDE 0.9 % IV SOLN
455.5000 mg | Freq: Once | INTRAVENOUS | Status: AC
  Administered 2016-03-21: 460 mg via INTRAVENOUS
  Filled 2016-03-21: qty 46

## 2016-03-21 MED ORDER — HEPARIN SOD (PORK) LOCK FLUSH 100 UNIT/ML IV SOLN
500.0000 [IU] | Freq: Once | INTRAVENOUS | Status: AC | PRN
  Administered 2016-03-21: 500 [IU]
  Filled 2016-03-21: qty 5

## 2016-03-21 MED ORDER — DOXORUBICIN HCL LIPOSOMAL CHEMO INJECTION 2 MG/ML
30.0000 mg/m2 | Freq: Once | INTRAVENOUS | Status: AC
  Administered 2016-03-21: 58 mg via INTRAVENOUS
  Filled 2016-03-21: qty 25

## 2016-03-21 MED ORDER — SODIUM CHLORIDE 0.9 % IV SOLN
10.0000 mg | Freq: Once | INTRAVENOUS | Status: AC
  Administered 2016-03-21: 10 mg via INTRAVENOUS
  Filled 2016-03-21: qty 1

## 2016-03-21 MED ORDER — PALONOSETRON HCL INJECTION 0.25 MG/5ML
0.2500 mg | Freq: Once | INTRAVENOUS | Status: AC
  Administered 2016-03-21: 0.25 mg via INTRAVENOUS
  Filled 2016-03-21: qty 5

## 2016-03-21 NOTE — Progress Notes (Signed)
Patient states she has been constipated since her treatment.  Also had nausea for about 3 days afterwards.  Requesting refill for Lactulose.

## 2016-03-21 NOTE — Progress Notes (Signed)
[place] OFFICE PROGRESS NOTE  Patient Care Team: Jerrol Banana., MD as PCP - General (Family Medicine) [facility] Gaetana Michaelis, MD as Referring Physician (Obstetrics and Gynecology) Robert Bellow, MD as Consulting Physician (General Surgery)  Ovarian cancer [facility])   Staging form: Ovary, AJCC 7th Edition     Clinical: Stage IIIC (T3c, N0, M0) - Unsigned    Oncology History   # April- MAY 2016- OVARIAN CANCER STAGE IIIC; CA-125 +2300+;  s/p OPTIMAL DEBULKING SURGERY   # MAY 2016- CARBO-TAXOL DD  # MAY CT 2017- RECURRENT/Peritoneal carcinomatosis/pelvic implant ~1.2cm/Ca-125-34; July 3rd- CARBO-DOXIL q 4W [with neulasta]  # LEFT BREAST CA s/p Lumpec & RT s/p TAM   # Afib [cardiology]; JUNE 2017- MUGA 62%  MOLECULAR TESTING- ATM genetic mutation      Ovarian cancer ([place])   12/16/2014 Initial Diagnosis    Ovarian cancer      INTERVAL HISTORY:  75 year old female patient with above history of ovarian cancer recurrent Platinum sensitive- currently on Doxil and carboplatin status post cycle 1 is here for follow-up  Patient had mild nausea no vomiting.  No rash. Palms and soles. Complains of nasal drainage  Patient's appetite is good. Denies any abdominal pain. Denies abdominal distention. No chest pain or shortness of breath or cough.   REVIEW OF SYSTEMS:  A complete 10 point review of system is done which is negative except mentioned above/history of present illness.   PAST MEDICAL HISTORY :  Past Medical History:  Diagnosis Date  . Atrial fibrillation ([place])   . Cancer of breast ([place]) 1992   Left  . GERD (gastroesophageal reflux disease)   . Hyperlipidemia   . Hypothyroidism   . Mitral insufficiency   . Ovarian cancer [facility])    s/p BSO optimal tumor debulking May 2016  . PVC (premature ventricular contraction)     PAST SURGICAL HISTORY :   Past Surgical History:  Procedure Laterality Date  . ABDOMINAL HYSTERECTOMY    . BILATERAL  SALPINGOOPHORECTOMY  May 2016   with optimal tumor debulking   . BREAST SURGERY     Left  . CESAREAN SECTION     x2  . CHOLECYSTECTOMY    . COLONOSCOPY  2006  . DEBULKING N/A 12/22/2014   Procedure: DEBULKING;  Surgeon: Gillis Ends, MD;  Location: [facility] ORS;  Service: Gynecology;  Laterality: N/A;  . LAPAROTOMY N/A 12/22/2014   Procedure: EXPLORATORY LAPAROTOMY;  Surgeon: Robert Bellow, MD;  Location: [facility] ORS;  Service: General;  Laterality: N/A;  . LAPAROTOMY WITH STAGING N/A 12/22/2014   Procedure: LAPAROTOMY WITH STAGING;  Surgeon: Gillis Ends, MD;  Location: [facility] ORS;  Service: Gynecology;  Laterality: N/A;  . PERIPHERAL VASCULAR CATHETERIZATION Left 12/22/2014   Procedure: PORTA CATH INSERTION;  Surgeon: Robert Bellow, MD;  Location: [facility] ORS;  Service: General;  Laterality: Left;  . PORTACATH PLACEMENT Right 12/22/2014   Procedure: INSERTION PORT-A-CATH;  Surgeon: Robert Bellow, MD;  Location: [facility] ORS;  Service: General;  Laterality: Right;  . SALPINGOOPHORECTOMY Bilateral 12/22/2014   Procedure: SALPINGO OOPHORECTOMY;  Surgeon: Gillis Ends, MD;  Location: [facility] ORS;  Service: Gynecology;  Laterality: Bilateral;  . UPPER GI ENDOSCOPY  09/25/04   hiatus hernia, schatzki ring and a single gastric polyp    FAMILY HISTORY :   Family History  Problem Relation Age of Onset  . Cancer Mother     breast, throat, and stomach  . Breast cancer Mother   .  Heart disease Father   . Pulmonary embolism Sister   . Pulmonary embolism Other   . Cancer Other   . Cancer Brother 60    angiosarcoma of the chest; carcinoid small intestinal tumor  . Hypertension Brother   . Cancer Maternal Aunt     breast cancer  . Cancer Maternal Grandfather 74    pancreatic    SOCIAL HISTORY:   Social History  Substance Use Topics  . Smoking status: Never Smoker  . Smokeless tobacco: Never Used  . Alcohol use No    ALLERGIES:  is allergic to carafate [sucralfate] and  sulfa antibiotics.  MEDICATIONS:  Current Outpatient Prescriptions  Medication Sig Dispense Refill  . acetaminophen (TYLENOL) 500 MG chewable tablet Chew 500 mg by mouth every 6 (six) hours as needed for pain.    Marland Kitchen lactulose (CHRONULAC) 10 GM/15ML solution Take 15 mLs (10 g total) by mouth 3 (three) times daily. 240 mL 0  . levothyroxine (SYNTHROID, LEVOTHROID) 50 MCG tablet TAKE 1 TABLET EVERY DAY 30 tablet 12  . lidocaine-prilocaine (EMLA) cream APPLY TO AFFECTED AREA IF NEEDED 30 g 1  . LORazepam (ATIVAN) 0.5 MG tablet Take 1 tablet (0.5 mg total) by mouth at bedtime. 30 tablet 3  . mometasone (ELOCON) 0.1 % cream Apply 1 application topically daily. Apply to affected area no more than 2 weeks a month. 45 g 0  . ondansetron (ZOFRAN) 4 MG tablet Take 4 mg by mouth every 6 (six) hours as needed for nausea or vomiting. Reported on 02/20/2016    . pantoprazole (PROTONIX) 40 MG tablet TAKE 1 TABLET EVERY DAY 30 tablet 12  . polyethylene glycol (MIRALAX / GLYCOLAX) packet Take 17 g by mouth daily.    . propafenone (RYTHMOL) 225 MG tablet Take 225 mg by mouth 2 (two) times daily.    . psyllium (METAMUCIL) 58.6 % powder Take 1 packet by mouth daily.    Marland Kitchen senna (SENOKOT) 8.6 MG TABS tablet Take 2 tablets by mouth daily.     No current facility-administered medications for this visit.    Facility-Administered Medications Ordered in Other Visits  Medication Dose Route Frequency Provider Last Rate Last Dose  . sodium chloride 0.9 % injection 10 mL  10 mL Intracatheter PRN Forest Gleason, MD   10 mL at 02/07/15 0930    PHYSICAL EXAMINATION: ECOG PERFORMANCE STATUS: 0 - Asymptomatic  BP 116/79 (BP Location: Left Arm, Patient Position: Sitting)   Pulse 76   Temp 97.7 F (36.5 C) (Tympanic)   Resp 18   Wt 182 lb (82.6 kg)   BMI 31.73 kg/m   Filed Weights   03/21/16 0927  Weight: 182 lb (82.6 kg)    GENERAL: Well-nourished well-developed; Alert, no distress and comfortAccompanied by her  husband. EYES: no pallor or icterus OROPHARYNX: no thrush or ulceration; good dentition  NECK: supple, no masses felt LYMPH:  no palpable lymphadenopathy in the cervical, axillary or inguinal regions LUNGS: clear to auscultation and  No wheeze or crackles HEART/CVS: regular rate & rhythm and no murmurs; No lower extremity edema ABDOMEN:abdomen soft, non-tender and normal bowel sounds Musculoskeletal:no cyanosis of digits and no clubbing  PSYCH: alert & oriented x 3 with fluent speech NEURO: no focal motor/sensory deficits SKIN:  no rashes or significant lesions  LABORATORY DATA:  I have reviewed the data as listed    Component Value Date/Time   NA 137 03/21/2016 0857   NA 140 12/14/2014 1037   K 3.6 03/21/2016 0857  K 4.4 12/14/2014 1037   CL 105 03/21/2016 0857   CL 106 12/14/2014 1037   CO2 26 03/21/2016 0857   CO2 26 12/14/2014 1037   GLUCOSE 98 03/21/2016 0857   GLUCOSE 87 12/14/2014 1037   BUN 17 03/21/2016 0857   BUN 12 12/14/2014 1037   CREATININE 1.03 (H) 03/21/2016 0857   CREATININE 0.97 12/14/2014 1037   CALCIUM 8.9 03/21/2016 0857   CALCIUM 9.2 12/14/2014 1037   PROT 6.7 03/21/2016 0857   PROT 7.3 12/14/2014 1037   ALBUMIN 3.8 03/21/2016 0857   ALBUMIN 3.8 12/14/2014 1037   AST 22 03/21/2016 0857   AST 20 12/14/2014 1037   ALT 14 03/21/2016 0857   ALT 11 (L) 12/14/2014 1037   ALKPHOS 72 03/21/2016 0857   ALKPHOS 138 (H) 12/14/2014 1037   BILITOT 0.6 03/21/2016 0857   BILITOT 0.5 12/14/2014 1037   GFRNONAA 53 (L) 03/21/2016 0857   GFRNONAA 59 (L) 12/14/2014 1037   GFRAA >60 03/21/2016 0857   GFRAA >60 12/14/2014 1037    No results found for: SPEP, UPEP  Lab Results  Component Value Date   WBC 5.2 03/21/2016   NEUTROABS 2.1 03/21/2016   HGB 13.4 03/21/2016   HCT 39.0 03/21/2016   MCV 93.8 03/21/2016   PLT 263 03/21/2016      Chemistry      Component Value Date/Time   NA 137 03/21/2016 0857   NA 140 12/14/2014 1037   K 3.6 03/21/2016 0857    K 4.4 12/14/2014 1037   CL 105 03/21/2016 0857   CL 106 12/14/2014 1037   CO2 26 03/21/2016 0857   CO2 26 12/14/2014 1037   BUN 17 03/21/2016 0857   BUN 12 12/14/2014 1037   CREATININE 1.03 (H) 03/21/2016 0857   CREATININE 0.97 12/14/2014 1037   GLU 84 03/16/2014      Component Value Date/Time   CALCIUM 8.9 03/21/2016 0857   CALCIUM 9.2 12/14/2014 1037   ALKPHOS 72 03/21/2016 0857   ALKPHOS 138 (H) 12/14/2014 1037   AST 22 03/21/2016 0857   AST 20 12/14/2014 1037   ALT 14 03/21/2016 0857   ALT 11 (L) 12/14/2014 1037   BILITOT 0.6 03/21/2016 0857   BILITOT 0.5 12/14/2014 1037       RADIOGRAPHIC STUDIES: I have personally reviewed the radiological images as listed and agreed with the findings in the report. No results found.   ASSESSMENT & PLAN:  Ovarian cancer ([place]) Recurrent ovarian cancer- patient declined clinical trial at [facility]; platinum sensitive. S/p  #1 Doxil- carbo q 4 weeks.  # Proceed with cycle # 2 today. Labs within normal limits.  # Nausea- no vomiting G-1  # fatigue- sec to chemo.   # allergies- continue claritin.   # follow up in 2 weeks/ labs/ chemo again 4 weeks/labs/ ca-125.    Orders Placed This Encounter  Procedures  . CBC with Differential    Standing Status:   Future    Standing Expiration Date:   03/21/2017  . Basic metabolic panel    Standing Status:   Future    Standing Expiration Date:   03/21/2017  . Comprehensive metabolic panel    Standing Status:   Future    Standing Expiration Date:   03/21/2017  . CBC with Differential    Standing Status:   Future    Standing Expiration Date:   03/21/2017   All questions were answered. The patient knows to call the clinic with any problems, questions  or concerns.      Cammie Sickle, MD 03/21/2016 6:01 PM

## 2016-03-21 NOTE — Progress Notes (Signed)
Jamie Conner returns today for MD visit, labs, and infusion. She returns today for the 6 month follow up visit off [id number] protocol.  Jamie Conner was accompanied by her husband and states she is doing well. She reports continuing to follow some of the same activities that she learned through participating in the study. She continues to walk each day and monitor her diet. Her husband also states that they thought she was doing really well. Informed her that she will continue to be in follow up and we will meet with her again in 6 months to complete another questionnaire. She completed the RAND-36 GSRS-IBS questionnaire today without difficulty.  Reminded her of next follow up in 6 months and to contact us if she needed anything. She verbalized understanding and I thanked her for her time and for participating in the follow up part of the study.  Mirian Mo, RN, BSN 03/21/2016 1:56 PM

## 2016-03-21 NOTE — Assessment & Plan Note (Addendum)
Recurrent ovarian cancer- patient declined clinical trial at [facility]; platinum sensitive. S/p  #1 Doxil- carbo q 4 weeks.  # Proceed with cycle # 2 today. Labs within normal limits.  # Nausea- no vomiting G-1  # fatigue- sec to chemo.   # allergies- continue claritin.   # follow up in 2 weeks/ labs/ chemo again 4 weeks/labs/ ca-125.

## 2016-03-22 LAB — CA 125: CA 125: 31.1 U/mL (ref 0.0–38.1)

## 2016-04-04 ENCOUNTER — Inpatient Hospital Stay: Payer: PPO

## 2016-04-04 DIAGNOSIS — C569 Malignant neoplasm of unspecified ovary: Secondary | ICD-10-CM

## 2016-04-04 DIAGNOSIS — Z5111 Encounter for antineoplastic chemotherapy: Secondary | ICD-10-CM | POA: Diagnosis not present

## 2016-04-04 DIAGNOSIS — K5900 Constipation, unspecified: Secondary | ICD-10-CM

## 2016-04-04 LAB — CBC WITH DIFFERENTIAL/PLATELET
Basophils Absolute: 0 10*3/uL (ref 0–0.1)
Basophils Relative: 1 %
EOS ABS: 0 10*3/uL (ref 0–0.7)
EOS PCT: 1 %
HCT: 36.2 % (ref 35.0–47.0)
HEMOGLOBIN: 12.5 g/dL (ref 12.0–16.0)
LYMPHS ABS: 1.9 10*3/uL (ref 1.0–3.6)
LYMPHS PCT: 43 %
MCH: 32.3 pg (ref 26.0–34.0)
MCHC: 34.5 g/dL (ref 32.0–36.0)
MCV: 93.8 fL (ref 80.0–100.0)
MONOS PCT: 9 %
Monocytes Absolute: 0.4 10*3/uL (ref 0.2–0.9)
Neutro Abs: 2 10*3/uL (ref 1.4–6.5)
Neutrophils Relative %: 46 %
Platelets: 190 10*3/uL (ref 150–440)
RBC: 3.86 MIL/uL (ref 3.80–5.20)
RDW: 15.2 % — ABNORMAL HIGH (ref 11.5–14.5)
WBC: 4.3 10*3/uL (ref 3.6–11.0)

## 2016-04-04 LAB — BASIC METABOLIC PANEL
Anion gap: 6 (ref 5–15)
BUN: 15 mg/dL (ref 6–20)
CHLORIDE: 105 mmol/L (ref 101–111)
CO2: 26 mmol/L (ref 22–32)
CREATININE: 0.8 mg/dL (ref 0.44–1.00)
Calcium: 8.9 mg/dL (ref 8.9–10.3)
GFR calc Af Amer: >60 mL/min (ref >60)
GFR calc non Af Amer: >60 mL/min (ref >60)
GLUCOSE: 79 mg/dL (ref 65–99)
POTASSIUM: 3.7 mmol/L (ref 3.5–5.1)
Sodium: 137 mmol/L (ref 135–145)

## 2016-04-18 ENCOUNTER — Inpatient Hospital Stay (HOSPITAL_BASED_OUTPATIENT_CLINIC_OR_DEPARTMENT_OTHER): Payer: PPO

## 2016-04-18 ENCOUNTER — Inpatient Hospital Stay: Payer: PPO

## 2016-04-18 ENCOUNTER — Inpatient Hospital Stay (HOSPITAL_BASED_OUTPATIENT_CLINIC_OR_DEPARTMENT_OTHER): Payer: PPO | Admitting: Internal Medicine

## 2016-04-18 VITALS — BP 121/77 | HR 80 | Temp 97.4°F | Resp 18 | Ht 63.5 in | Wt 183.0 lb

## 2016-04-18 DIAGNOSIS — K5900 Constipation, unspecified: Secondary | ICD-10-CM

## 2016-04-18 DIAGNOSIS — Z7689 Persons encountering health services in other specified circumstances: Secondary | ICD-10-CM | POA: Diagnosis not present

## 2016-04-18 DIAGNOSIS — M79641 Pain in right hand: Secondary | ICD-10-CM

## 2016-04-18 DIAGNOSIS — Z5111 Encounter for antineoplastic chemotherapy: Secondary | ICD-10-CM | POA: Diagnosis not present

## 2016-04-18 DIAGNOSIS — E785 Hyperlipidemia, unspecified: Secondary | ICD-10-CM

## 2016-04-18 DIAGNOSIS — I340 Nonrheumatic mitral (valve) insufficiency: Secondary | ICD-10-CM

## 2016-04-18 DIAGNOSIS — Z853 Personal history of malignant neoplasm of breast: Secondary | ICD-10-CM

## 2016-04-18 DIAGNOSIS — C569 Malignant neoplasm of unspecified ovary: Secondary | ICD-10-CM

## 2016-04-18 DIAGNOSIS — M79642 Pain in left hand: Secondary | ICD-10-CM

## 2016-04-18 DIAGNOSIS — Z79899 Other long term (current) drug therapy: Secondary | ICD-10-CM

## 2016-04-18 DIAGNOSIS — R530 Neoplastic (malignant) related fatigue: Secondary | ICD-10-CM

## 2016-04-18 DIAGNOSIS — Z808 Family history of malignant neoplasm of other organs or systems: Secondary | ICD-10-CM

## 2016-04-18 DIAGNOSIS — I4891 Unspecified atrial fibrillation: Secondary | ICD-10-CM

## 2016-04-18 DIAGNOSIS — I493 Ventricular premature depolarization: Secondary | ICD-10-CM

## 2016-04-18 DIAGNOSIS — Z803 Family history of malignant neoplasm of breast: Secondary | ICD-10-CM

## 2016-04-18 DIAGNOSIS — Z800 Family history of malignant neoplasm of digestive organs: Secondary | ICD-10-CM

## 2016-04-18 DIAGNOSIS — M79673 Pain in unspecified foot: Secondary | ICD-10-CM

## 2016-04-18 DIAGNOSIS — E039 Hypothyroidism, unspecified: Secondary | ICD-10-CM

## 2016-04-18 DIAGNOSIS — R110 Nausea: Secondary | ICD-10-CM

## 2016-04-18 LAB — COMPREHENSIVE METABOLIC PANEL
ALBUMIN: 3.7 g/dL (ref 3.5–5.0)
ALT: 15 U/L (ref 14–54)
AST: 23 U/L (ref 15–41)
Alkaline Phosphatase: 73 U/L (ref 38–126)
Anion gap: 6 (ref 5–15)
BUN: 17 mg/dL (ref 6–20)
CHLORIDE: 107 mmol/L (ref 101–111)
CO2: 24 mmol/L (ref 22–32)
CREATININE: 0.89 mg/dL (ref 0.44–1.00)
Calcium: 8.9 mg/dL (ref 8.9–10.3)
GFR calc Af Amer: >60 mL/min (ref >60)
GFR calc non Af Amer: >60 mL/min (ref >60)
GLUCOSE: 89 mg/dL (ref 65–99)
Potassium: 3.7 mmol/L (ref 3.5–5.1)
SODIUM: 137 mmol/L (ref 135–145)
Total Bilirubin: 0.5 mg/dL (ref 0.3–1.2)
Total Protein: 6.7 g/dL (ref 6.5–8.1)

## 2016-04-18 LAB — CBC WITH DIFFERENTIAL/PLATELET
Basophils Absolute: 0 10*3/uL (ref 0–0.1)
Basophils Relative: 1 %
EOS ABS: 0 10*3/uL (ref 0–0.7)
EOS PCT: 1 %
HCT: 38.6 % (ref 35.0–47.0)
Hemoglobin: 13.2 g/dL (ref 12.0–16.0)
LYMPHS ABS: 2.1 10*3/uL (ref 1.0–3.6)
LYMPHS PCT: 45 %
MCH: 32.7 pg (ref 26.0–34.0)
MCHC: 34.2 g/dL (ref 32.0–36.0)
MCV: 95.8 fL (ref 80.0–100.0)
MONOS PCT: 15 %
Monocytes Absolute: 0.7 10*3/uL (ref 0.2–0.9)
Neutro Abs: 1.7 10*3/uL (ref 1.4–6.5)
Neutrophils Relative %: 38 %
PLATELETS: 283 10*3/uL (ref 150–440)
RBC: 4.03 MIL/uL (ref 3.80–5.20)
RDW: 16.9 % — ABNORMAL HIGH (ref 11.5–14.5)
WBC: 4.6 10*3/uL (ref 3.6–11.0)

## 2016-04-18 MED ORDER — PEGFILGRASTIM 6 MG/0.6ML ~~LOC~~ PSKT
6.0000 mg | PREFILLED_SYRINGE | Freq: Once | SUBCUTANEOUS | Status: AC
  Administered 2016-04-18: 6 mg via SUBCUTANEOUS
  Filled 2016-04-18: qty 0.6

## 2016-04-18 MED ORDER — SODIUM CHLORIDE 0.9 % IV SOLN
455.5000 mg | Freq: Once | INTRAVENOUS | Status: AC
  Administered 2016-04-18: 460 mg via INTRAVENOUS
  Filled 2016-04-18: qty 46

## 2016-04-18 MED ORDER — HEPARIN SOD (PORK) LOCK FLUSH 100 UNIT/ML IV SOLN
500.0000 [IU] | Freq: Once | INTRAVENOUS | Status: AC | PRN
  Administered 2016-04-18: 500 [IU]
  Filled 2016-04-18: qty 5

## 2016-04-18 MED ORDER — SODIUM CHLORIDE 0.9 % IV SOLN
10.0000 mg | Freq: Once | INTRAVENOUS | Status: AC
  Administered 2016-04-18: 10 mg via INTRAVENOUS
  Filled 2016-04-18: qty 1

## 2016-04-18 MED ORDER — DOXORUBICIN HCL LIPOSOMAL CHEMO INJECTION 2 MG/ML
30.0000 mg/m2 | Freq: Once | INTRAVENOUS | Status: AC
  Administered 2016-04-18: 58 mg via INTRAVENOUS
  Filled 2016-04-18: qty 4

## 2016-04-18 MED ORDER — SODIUM CHLORIDE 0.9% FLUSH
10.0000 mL | INTRAVENOUS | Status: DC | PRN
  Administered 2016-04-18: 10 mL
  Filled 2016-04-18: qty 10

## 2016-04-18 MED ORDER — PALONOSETRON HCL INJECTION 0.25 MG/5ML
0.2500 mg | Freq: Once | INTRAVENOUS | Status: AC
  Administered 2016-04-18: 0.25 mg via INTRAVENOUS
  Filled 2016-04-18: qty 5

## 2016-04-18 MED ORDER — DEXTROSE 5 % IV SOLN
Freq: Once | INTRAVENOUS | Status: AC
  Administered 2016-04-18: 10:00:00 via INTRAVENOUS
  Filled 2016-04-18: qty 1000

## 2016-04-18 NOTE — Progress Notes (Signed)
[place] OFFICE PROGRESS NOTE  Patient Care Team: Jerrol Banana., MD as PCP - General (Family Medicine) [facility] Gaetana Michaelis, MD as Referring Physician (Obstetrics and Gynecology) Robert Bellow, MD as Consulting Physician (General Surgery)  Ovarian cancer [facility])   Staging form: Ovary, AJCC 7th Edition     Clinical: Stage IIIC (T3c, N0, M0) - Unsigned    Oncology History   # April- MAY 2016- OVARIAN CANCER STAGE IIIC; CA-125 +2300+;  s/p OPTIMAL DEBULKING SURGERY   # MAY 2016- CARBO-TAXOL DD  # MAY CT 2017- RECURRENT/Peritoneal carcinomatosis/pelvic implant ~1.2cm/Ca-125-34; July 3rd- CARBO-DOXIL q 4W [with neulasta]  # LEFT BREAST CA s/p Lumpec & RT s/p TAM   # Afib [cardiology]; JUNE 2017- MUGA 62%  MOLECULAR TESTING- ATM genetic mutation      Ovarian cancer ([place])   12/16/2014 Initial Diagnosis    Ovarian cancer       INTERVAL HISTORY:  75 year old female patient with above history of ovarian cancer recurrent Platinum sensitive- currently on Doxil and carboplatin status post cycle 2 is here for follow-up   Patient had mild nausea no vomiting.  sHe did take antiemetics. Complains of constipation. Complains of mild pain in her hand and feet. No redness or rash.  Patient's appetite is good. Denies any abdominal pain. Denies abdominal distention. No chest pain or shortness of breath or cough.   REVIEW OF SYSTEMS:  A complete 10 point review of system is done which is negative except mentioned above/history of present illness.   PAST MEDICAL HISTORY :  Past Medical History:  Diagnosis Date  . Atrial fibrillation ([place])   . Cancer of breast ([place]) 1992   Left  . GERD (gastroesophageal reflux disease)   . Hyperlipidemia   . Hypothyroidism   . Mitral insufficiency   . Ovarian cancer [facility])    s/p BSO optimal tumor debulking May 2016  . PVC (premature ventricular contraction)     PAST SURGICAL HISTORY :   Past Surgical History:   Procedure Laterality Date  . ABDOMINAL HYSTERECTOMY    . BILATERAL SALPINGOOPHORECTOMY  May 2016   with optimal tumor debulking   . BREAST SURGERY     Left  . CESAREAN SECTION     x2  . CHOLECYSTECTOMY    . COLONOSCOPY  2006  . DEBULKING N/A 12/22/2014   Procedure: DEBULKING;  Surgeon: Gillis Ends, MD;  Location: [facility] ORS;  Service: Gynecology;  Laterality: N/A;  . LAPAROTOMY N/A 12/22/2014   Procedure: EXPLORATORY LAPAROTOMY;  Surgeon: Robert Bellow, MD;  Location: [facility] ORS;  Service: General;  Laterality: N/A;  . LAPAROTOMY WITH STAGING N/A 12/22/2014   Procedure: LAPAROTOMY WITH STAGING;  Surgeon: Gillis Ends, MD;  Location: [facility] ORS;  Service: Gynecology;  Laterality: N/A;  . PERIPHERAL VASCULAR CATHETERIZATION Left 12/22/2014   Procedure: PORTA CATH INSERTION;  Surgeon: Robert Bellow, MD;  Location: [facility] ORS;  Service: General;  Laterality: Left;  . PORTACATH PLACEMENT Right 12/22/2014   Procedure: INSERTION PORT-A-CATH;  Surgeon: Robert Bellow, MD;  Location: [facility] ORS;  Service: General;  Laterality: Right;  . SALPINGOOPHORECTOMY Bilateral 12/22/2014   Procedure: SALPINGO OOPHORECTOMY;  Surgeon: Gillis Ends, MD;  Location: [facility] ORS;  Service: Gynecology;  Laterality: Bilateral;  . UPPER GI ENDOSCOPY  09/25/04   hiatus hernia, schatzki ring and a single gastric polyp    FAMILY HISTORY :   Family History  Problem Relation Age of Onset  . Cancer Mother  breast, throat, and stomach  . Breast cancer Mother   . Heart disease Father   . Pulmonary embolism Sister   . Pulmonary embolism Other   . Cancer Other   . Cancer Brother 60    angiosarcoma of the chest; carcinoid small intestinal tumor  . Hypertension Brother   . Cancer Maternal Aunt     breast cancer  . Cancer Maternal Grandfather 43    pancreatic    SOCIAL HISTORY:   Social History  Substance Use Topics  . Smoking status: Never Smoker  . Smokeless tobacco: Never Used  .  Alcohol use No    ALLERGIES:  is allergic to carafate [sucralfate] and sulfa antibiotics.  MEDICATIONS:  Current Outpatient Prescriptions  Medication Sig Dispense Refill  . acetaminophen (TYLENOL) 500 MG chewable tablet Chew 500 mg by mouth every 6 (six) hours as needed for pain.    Marland Kitchen lactulose (CHRONULAC) 10 GM/15ML solution Take 15 mLs (10 g total) by mouth 3 (three) times daily. 240 mL 0  . levothyroxine (SYNTHROID, LEVOTHROID) 50 MCG tablet TAKE 1 TABLET EVERY DAY 30 tablet 12  . lidocaine-prilocaine (EMLA) cream APPLY TO AFFECTED AREA IF NEEDED 30 g 1  . LORazepam (ATIVAN) 0.5 MG tablet Take 1 tablet (0.5 mg total) by mouth at bedtime. 30 tablet 3  . mometasone (ELOCON) 0.1 % cream Apply 1 application topically daily. Apply to affected area no more than 2 weeks a month. 45 g 0  . ondansetron (ZOFRAN) 4 MG tablet Take 4 mg by mouth every 6 (six) hours as needed for nausea or vomiting. Reported on 02/20/2016    . pantoprazole (PROTONIX) 40 MG tablet TAKE 1 TABLET EVERY DAY 30 tablet 12  . polyethylene glycol (MIRALAX / GLYCOLAX) packet Take 17 g by mouth daily.    . propafenone (RYTHMOL) 225 MG tablet Take 225 mg by mouth 2 (two) times daily.    . psyllium (METAMUCIL) 58.6 % powder Take 1 packet by mouth daily.    Marland Kitchen senna (SENOKOT) 8.6 MG TABS tablet Take 2 tablets by mouth daily.     No current facility-administered medications for this visit.    Facility-Administered Medications Ordered in Other Visits  Medication Dose Route Frequency Provider Last Rate Last Dose  . sodium chloride 0.9 % injection 10 mL  10 mL Intracatheter PRN Forest Gleason, MD   10 mL at 02/07/15 0930    PHYSICAL EXAMINATION: ECOG PERFORMANCE STATUS: 0 - Asymptomatic  BP 121/77 (BP Location: Left Arm)   Pulse 80   Temp 97.4 F (36.3 C) (Tympanic)   Resp 18   Ht 5' 3.5" (1.613 m)   Wt 183 lb (83 kg)   BMI 31.91 kg/m   Filed Weights   04/18/16 0918  Weight: 183 lb (83 kg)    GENERAL: Well-nourished  well-developed; Alert, no distress and comfortAccompanied by her husband. EYES: no pallor or icterus OROPHARYNX: no thrush or ulceration; good dentition  NECK: supple, no masses felt LYMPH:  no palpable lymphadenopathy in the cervical, axillary or inguinal regions LUNGS: clear to auscultation and  No wheeze or crackles HEART/CVS: regular rate & rhythm and no murmurs; No lower extremity edema ABDOMEN:abdomen soft, non-tender and normal bowel sounds Musculoskeletal:no cyanosis of digits and no clubbing  PSYCH: alert & oriented x 3 with fluent speech NEURO: no focal motor/sensory deficits SKIN:  no rashes or significant lesions  LABORATORY DATA:  I have reviewed the data as listed    Component Value Date/Time   NA  137 04/18/2016 0849   NA 140 12/14/2014 1037   K 3.7 04/18/2016 0849   K 4.4 12/14/2014 1037   CL 107 04/18/2016 0849   CL 106 12/14/2014 1037   CO2 24 04/18/2016 0849   CO2 26 12/14/2014 1037   GLUCOSE 89 04/18/2016 0849   GLUCOSE 87 12/14/2014 1037   BUN 17 04/18/2016 0849   BUN 12 12/14/2014 1037   CREATININE 0.89 04/18/2016 0849   CREATININE 0.97 12/14/2014 1037   CALCIUM 8.9 04/18/2016 0849   CALCIUM 9.2 12/14/2014 1037   PROT 6.7 04/18/2016 0849   PROT 7.3 12/14/2014 1037   ALBUMIN 3.7 04/18/2016 0849   ALBUMIN 3.8 12/14/2014 1037   AST 23 04/18/2016 0849   AST 20 12/14/2014 1037   ALT 15 04/18/2016 0849   ALT 11 (L) 12/14/2014 1037   ALKPHOS 73 04/18/2016 0849   ALKPHOS 138 (H) 12/14/2014 1037   BILITOT 0.5 04/18/2016 0849   BILITOT 0.5 12/14/2014 1037   GFRNONAA >60 04/18/2016 0849   GFRNONAA 59 (L) 12/14/2014 1037   GFRAA >60 04/18/2016 0849   GFRAA >60 12/14/2014 1037    No results found for: SPEP, UPEP  Lab Results  Component Value Date   WBC 4.6 04/18/2016   NEUTROABS 1.7 04/18/2016   HGB 13.2 04/18/2016   HCT 38.6 04/18/2016   MCV 95.8 04/18/2016   PLT 283 04/18/2016      Chemistry      Component Value Date/Time   NA 137 04/18/2016  0849   NA 140 12/14/2014 1037   K 3.7 04/18/2016 0849   K 4.4 12/14/2014 1037   CL 107 04/18/2016 0849   CL 106 12/14/2014 1037   CO2 24 04/18/2016 0849   CO2 26 12/14/2014 1037   BUN 17 04/18/2016 0849   BUN 12 12/14/2014 1037   CREATININE 0.89 04/18/2016 0849   CREATININE 0.97 12/14/2014 1037   GLU 84 03/16/2014      Component Value Date/Time   CALCIUM 8.9 04/18/2016 0849   CALCIUM 9.2 12/14/2014 1037   ALKPHOS 73 04/18/2016 0849   ALKPHOS 138 (H) 12/14/2014 1037   AST 23 04/18/2016 0849   AST 20 12/14/2014 1037   ALT 15 04/18/2016 0849   ALT 11 (L) 12/14/2014 1037   BILITOT 0.5 04/18/2016 0849   BILITOT 0.5 12/14/2014 1037       RADIOGRAPHIC STUDIES: I have personally reviewed the radiological images as listed and agreed with the findings in the report. No results found.   ASSESSMENT & PLAN:  Ovarian cancer ([place]) Recurrent ovarian cancer- patient declined clinical trial at [facility]; platinum sensitive. S/p  #2 Doxil- carbo q 4 weeks.  # Proceed with cycle # 3 today. Labs within normal limits. ca-125- improving; will get CT after this cycle  # Nausea- no vomiting G-1- improved post zofran.  # constipation- lactulose prn; miralax every day.   # hand foot G-1. Vaseline/ gloves/udder cream.   # follow up in 2 weeks/ labs/ chemo again 4 weeks/labs/ ca-125. CT in 3 weeks.     Orders Placed This Encounter  Procedures  . CT ABDOMEN PELVIS W CONTRAST    Standing Status:   Future    Standing Expiration Date:   07/18/2017    Order Specific Question:   Reason for Exam (SYMPTOM  OR DIAGNOSIS REQUIRED)    Answer:   ovarian cancer    Order Specific Question:   Preferred imaging location?    Answer:   [facility]  . CBC  with Differential    Standing Status:   Future    Standing Expiration Date:   04/18/2017  . Basic metabolic panel    Standing Status:   Future    Standing Expiration Date:   04/18/2017  . CBC with Differential    Standing Status:   Future     Standing Expiration Date:   04/18/2017  . Comprehensive metabolic panel    Standing Status:   Future    Standing Expiration Date:   04/18/2017   All questions were answered. The patient knows to call the clinic with any problems, questions or concerns.      Cammie Sickle, MD 04/18/2016 5:33 PM

## 2016-04-18 NOTE — Progress Notes (Signed)
Patient reports frequent episodes of constipation. She uses miralax daily to prevent constipation. Last BM this morning/formed.  She reported 1-2 episodes of nausea s/p last tx. Used oral antiemetics and Nausea resolved.  Reports grade 1 numbness in finger tips.

## 2016-04-18 NOTE — Assessment & Plan Note (Addendum)
Recurrent ovarian cancer- patient declined clinical trial at [facility]; platinum sensitive. S/p  #2 Doxil- carbo q 4 weeks.  # Proceed with cycle # 3 today. Labs within normal limits. ca-125- improving; will get CT after this cycle  # Nausea- no vomiting G-1- improved post zofran.  # constipation- lactulose prn; miralax every day.   # hand foot G-1. Vaseline/ gloves/udder cream.   # follow up in 2 weeks/ labs/ chemo again 4 weeks/labs/ ca-125. CT in 3 weeks.

## 2016-04-25 DIAGNOSIS — I340 Nonrheumatic mitral (valve) insufficiency: Secondary | ICD-10-CM | POA: Diagnosis not present

## 2016-04-25 DIAGNOSIS — I480 Paroxysmal atrial fibrillation: Secondary | ICD-10-CM | POA: Diagnosis not present

## 2016-04-25 DIAGNOSIS — E782 Mixed hyperlipidemia: Secondary | ICD-10-CM | POA: Diagnosis not present

## 2016-04-25 DIAGNOSIS — I10 Essential (primary) hypertension: Secondary | ICD-10-CM | POA: Diagnosis not present

## 2016-04-25 DIAGNOSIS — I493 Ventricular premature depolarization: Secondary | ICD-10-CM | POA: Diagnosis not present

## 2016-04-25 DIAGNOSIS — R002 Palpitations: Secondary | ICD-10-CM | POA: Diagnosis not present

## 2016-05-02 ENCOUNTER — Inpatient Hospital Stay: Payer: PPO | Attending: Internal Medicine

## 2016-05-02 ENCOUNTER — Other Ambulatory Visit: Payer: Self-pay

## 2016-05-02 DIAGNOSIS — Z7689 Persons encountering health services in other specified circumstances: Secondary | ICD-10-CM | POA: Diagnosis not present

## 2016-05-02 DIAGNOSIS — T451X5S Adverse effect of antineoplastic and immunosuppressive drugs, sequela: Secondary | ICD-10-CM | POA: Insufficient documentation

## 2016-05-02 DIAGNOSIS — K219 Gastro-esophageal reflux disease without esophagitis: Secondary | ICD-10-CM | POA: Diagnosis not present

## 2016-05-02 DIAGNOSIS — I4891 Unspecified atrial fibrillation: Secondary | ICD-10-CM | POA: Insufficient documentation

## 2016-05-02 DIAGNOSIS — Z923 Personal history of irradiation: Secondary | ICD-10-CM | POA: Diagnosis not present

## 2016-05-02 DIAGNOSIS — R110 Nausea: Secondary | ICD-10-CM | POA: Insufficient documentation

## 2016-05-02 DIAGNOSIS — E039 Hypothyroidism, unspecified: Secondary | ICD-10-CM | POA: Diagnosis not present

## 2016-05-02 DIAGNOSIS — Z5111 Encounter for antineoplastic chemotherapy: Secondary | ICD-10-CM | POA: Diagnosis not present

## 2016-05-02 DIAGNOSIS — Z79899 Other long term (current) drug therapy: Secondary | ICD-10-CM | POA: Diagnosis not present

## 2016-05-02 DIAGNOSIS — L271 Localized skin eruption due to drugs and medicaments taken internally: Secondary | ICD-10-CM | POA: Diagnosis not present

## 2016-05-02 DIAGNOSIS — Z809 Family history of malignant neoplasm, unspecified: Secondary | ICD-10-CM | POA: Diagnosis not present

## 2016-05-02 DIAGNOSIS — C786 Secondary malignant neoplasm of retroperitoneum and peritoneum: Secondary | ICD-10-CM | POA: Diagnosis not present

## 2016-05-02 DIAGNOSIS — I493 Ventricular premature depolarization: Secondary | ICD-10-CM | POA: Insufficient documentation

## 2016-05-02 DIAGNOSIS — I340 Nonrheumatic mitral (valve) insufficiency: Secondary | ICD-10-CM | POA: Insufficient documentation

## 2016-05-02 DIAGNOSIS — E785 Hyperlipidemia, unspecified: Secondary | ICD-10-CM | POA: Diagnosis not present

## 2016-05-02 DIAGNOSIS — Z800 Family history of malignant neoplasm of digestive organs: Secondary | ICD-10-CM | POA: Diagnosis not present

## 2016-05-02 DIAGNOSIS — C569 Malignant neoplasm of unspecified ovary: Secondary | ICD-10-CM | POA: Insufficient documentation

## 2016-05-02 DIAGNOSIS — Z803 Family history of malignant neoplasm of breast: Secondary | ICD-10-CM | POA: Insufficient documentation

## 2016-05-02 LAB — CBC WITH DIFFERENTIAL/PLATELET
Basophils Absolute: 0 10*3/uL (ref 0–0.1)
Basophils Relative: 1 %
EOS ABS: 0 10*3/uL (ref 0–0.7)
Eosinophils Relative: 1 %
HEMATOCRIT: 35.6 % (ref 35.0–47.0)
HEMOGLOBIN: 12.3 g/dL (ref 12.0–16.0)
LYMPHS ABS: 1.9 10*3/uL (ref 1.0–3.6)
Lymphocytes Relative: 38 %
MCH: 33.2 pg (ref 26.0–34.0)
MCHC: 34.6 g/dL (ref 32.0–36.0)
MCV: 96 fL (ref 80.0–100.0)
MONO ABS: 0.4 10*3/uL (ref 0.2–0.9)
MONOS PCT: 8 %
NEUTROS ABS: 2.7 10*3/uL (ref 1.4–6.5)
NEUTROS PCT: 52 %
Platelets: 142 10*3/uL — ABNORMAL LOW (ref 150–440)
RBC: 3.71 MIL/uL — ABNORMAL LOW (ref 3.80–5.20)
RDW: 16.3 % — AB (ref 11.5–14.5)
WBC: 5.1 10*3/uL (ref 3.6–11.0)

## 2016-05-02 LAB — BASIC METABOLIC PANEL
Anion gap: 7 (ref 5–15)
BUN: 16 mg/dL (ref 6–20)
CHLORIDE: 107 mmol/L (ref 101–111)
CO2: 23 mmol/L (ref 22–32)
CREATININE: 0.81 mg/dL (ref 0.44–1.00)
Calcium: 9 mg/dL (ref 8.9–10.3)
GFR calc Af Amer: >60 mL/min (ref >60)
GFR calc non Af Amer: >60 mL/min (ref >60)
GLUCOSE: 87 mg/dL (ref 65–99)
Potassium: 4.2 mmol/L (ref 3.5–5.1)
Sodium: 137 mmol/L (ref 135–145)

## 2016-05-09 ENCOUNTER — Ambulatory Visit: Payer: PPO

## 2016-05-11 ENCOUNTER — Ambulatory Visit
Admission: RE | Admit: 2016-05-11 | Discharge: 2016-05-11 | Disposition: A | Discharge status: Home / Self Care | Payer: PPO | Source: Ambulatory Visit | Attending: Internal Medicine | Admitting: Internal Medicine

## 2016-05-11 DIAGNOSIS — C569 Malignant neoplasm of unspecified ovary: Secondary | ICD-10-CM | POA: Insufficient documentation

## 2016-05-11 DIAGNOSIS — K449 Diaphragmatic hernia without obstruction or gangrene: Secondary | ICD-10-CM | POA: Diagnosis not present

## 2016-05-11 MED ORDER — IOPAMIDOL (ISOVUE-300) INJECTION 61%
100.0000 mL | Freq: Once | INTRAVENOUS | Status: AC | PRN
  Administered 2016-05-11: 100 mL via INTRAVENOUS

## 2016-05-16 ENCOUNTER — Inpatient Hospital Stay: Payer: PPO

## 2016-05-16 ENCOUNTER — Encounter: Payer: Self-pay | Admitting: Internal Medicine

## 2016-05-16 ENCOUNTER — Inpatient Hospital Stay (HOSPITAL_BASED_OUTPATIENT_CLINIC_OR_DEPARTMENT_OTHER): Payer: PPO | Admitting: Internal Medicine

## 2016-05-16 VITALS — BP 136/89 | HR 81 | Temp 96.3°F | Resp 17 | Ht 63.5 in | Wt 182.2 lb

## 2016-05-16 DIAGNOSIS — C786 Secondary malignant neoplasm of retroperitoneum and peritoneum: Secondary | ICD-10-CM

## 2016-05-16 DIAGNOSIS — Z7689 Persons encountering health services in other specified circumstances: Secondary | ICD-10-CM | POA: Diagnosis not present

## 2016-05-16 DIAGNOSIS — C569 Malignant neoplasm of unspecified ovary: Secondary | ICD-10-CM

## 2016-05-16 DIAGNOSIS — I4891 Unspecified atrial fibrillation: Secondary | ICD-10-CM | POA: Diagnosis not present

## 2016-05-16 DIAGNOSIS — Z800 Family history of malignant neoplasm of digestive organs: Secondary | ICD-10-CM

## 2016-05-16 DIAGNOSIS — Z923 Personal history of irradiation: Secondary | ICD-10-CM

## 2016-05-16 DIAGNOSIS — Z79899 Other long term (current) drug therapy: Secondary | ICD-10-CM

## 2016-05-16 DIAGNOSIS — R110 Nausea: Secondary | ICD-10-CM

## 2016-05-16 DIAGNOSIS — I340 Nonrheumatic mitral (valve) insufficiency: Secondary | ICD-10-CM

## 2016-05-16 DIAGNOSIS — Z809 Family history of malignant neoplasm, unspecified: Secondary | ICD-10-CM

## 2016-05-16 DIAGNOSIS — Z5111 Encounter for antineoplastic chemotherapy: Secondary | ICD-10-CM | POA: Diagnosis not present

## 2016-05-16 DIAGNOSIS — T451X5S Adverse effect of antineoplastic and immunosuppressive drugs, sequela: Secondary | ICD-10-CM

## 2016-05-16 DIAGNOSIS — E785 Hyperlipidemia, unspecified: Secondary | ICD-10-CM

## 2016-05-16 DIAGNOSIS — K219 Gastro-esophageal reflux disease without esophagitis: Secondary | ICD-10-CM

## 2016-05-16 DIAGNOSIS — I493 Ventricular premature depolarization: Secondary | ICD-10-CM

## 2016-05-16 DIAGNOSIS — E039 Hypothyroidism, unspecified: Secondary | ICD-10-CM

## 2016-05-16 DIAGNOSIS — Z803 Family history of malignant neoplasm of breast: Secondary | ICD-10-CM

## 2016-05-16 DIAGNOSIS — L271 Localized skin eruption due to drugs and medicaments taken internally: Secondary | ICD-10-CM

## 2016-05-16 LAB — COMPREHENSIVE METABOLIC PANEL
ALT: 13 U/L — AB (ref 14–54)
AST: 21 U/L (ref 15–41)
Albumin: 3.6 g/dL (ref 3.5–5.0)
Alkaline Phosphatase: 71 U/L (ref 38–126)
Anion gap: 5 (ref 5–15)
BUN: 14 mg/dL (ref 6–20)
CHLORIDE: 108 mmol/L (ref 101–111)
CO2: 26 mmol/L (ref 22–32)
CREATININE: 0.84 mg/dL (ref 0.44–1.00)
Calcium: 9.2 mg/dL (ref 8.9–10.3)
GFR calc Af Amer: >60 mL/min (ref >60)
GFR calc non Af Amer: >60 mL/min (ref >60)
GLUCOSE: 98 mg/dL (ref 65–99)
Potassium: 4.1 mmol/L (ref 3.5–5.1)
SODIUM: 139 mmol/L (ref 135–145)
Total Bilirubin: 0.5 mg/dL (ref 0.3–1.2)
Total Protein: 6.7 g/dL (ref 6.5–8.1)

## 2016-05-16 LAB — CBC WITH DIFFERENTIAL/PLATELET
BASOS ABS: 0 10*3/uL (ref 0–0.1)
Basophils Relative: 1 %
EOS ABS: 0 10*3/uL (ref 0–0.7)
EOS PCT: 1 %
HCT: 36.6 % (ref 35.0–47.0)
Hemoglobin: 12.7 g/dL (ref 12.0–16.0)
LYMPHS PCT: 44 %
Lymphs Abs: 2.1 10*3/uL (ref 1.0–3.6)
MCH: 34.1 pg — ABNORMAL HIGH (ref 26.0–34.0)
MCHC: 34.8 g/dL (ref 32.0–36.0)
MCV: 98 fL (ref 80.0–100.0)
Monocytes Absolute: 0.8 10*3/uL (ref 0.2–0.9)
Monocytes Relative: 16 %
NEUTROS PCT: 38 %
Neutro Abs: 1.8 10*3/uL (ref 1.4–6.5)
PLATELETS: 291 10*3/uL (ref 150–440)
RBC: 3.74 MIL/uL — AB (ref 3.80–5.20)
RDW: 17.9 % — ABNORMAL HIGH (ref 11.5–14.5)
WBC: 4.8 10*3/uL (ref 3.6–11.0)

## 2016-05-16 MED ORDER — PALONOSETRON HCL INJECTION 0.25 MG/5ML
0.2500 mg | Freq: Once | INTRAVENOUS | Status: AC
  Administered 2016-05-16: 0.25 mg via INTRAVENOUS
  Filled 2016-05-16: qty 5

## 2016-05-16 MED ORDER — TRIAMCINOLONE ACETONIDE 0.5 % EX OINT: 1.0000 "application " | TOPICAL_OINTMENT | Freq: Two times a day (BID) | CUTANEOUS | 0 refills | Status: DC

## 2016-05-16 MED ORDER — SODIUM CHLORIDE 0.9 % IV SOLN
455.5000 mg | Freq: Once | INTRAVENOUS | Status: AC
  Administered 2016-05-16: 460 mg via INTRAVENOUS
  Filled 2016-05-16: qty 46

## 2016-05-16 MED ORDER — SODIUM CHLORIDE 0.9 % IV SOLN
10.0000 mg | Freq: Once | INTRAVENOUS | Status: AC
  Administered 2016-05-16: 10 mg via INTRAVENOUS
  Filled 2016-05-16: qty 1

## 2016-05-16 MED ORDER — SODIUM CHLORIDE 0.9 % IV SOLN
INTRAVENOUS | Status: DC
  Administered 2016-05-16: 12:00:00 via INTRAVENOUS
  Filled 2016-05-16: qty 1000

## 2016-05-16 MED ORDER — HEPARIN SOD (PORK) LOCK FLUSH 100 UNIT/ML IV SOLN
500.0000 [IU] | Freq: Once | INTRAVENOUS | Status: AC
Start: 2016-05-16 — End: 2016-05-16
  Administered 2016-05-16: 500 [IU] via INTRAVENOUS
  Filled 2016-05-16: qty 5

## 2016-05-16 MED ORDER — PEGFILGRASTIM 6 MG/0.6ML ~~LOC~~ PSKT
6.0000 mg | PREFILLED_SYRINGE | Freq: Once | SUBCUTANEOUS | Status: AC
  Administered 2016-05-16: 6 mg via SUBCUTANEOUS
  Filled 2016-05-16: qty 0.6

## 2016-05-16 NOTE — Assessment & Plan Note (Addendum)
Recurrent ovarian cancer- Platinum sensitive. S/p  #3 Doxil- carbo q 4 weeks. CT scan shows slight increase in the size of the peritoneal nodules [as described above]; however CA-125 improving. Awaits even 25 from today.   # Also discussed that the discrepancy with the timing of her CT scan/May 23; and starting of chemotherapy on July 3 might also affect the reading of the CT scan after 3 cycles/September 2017.   # Proceed with cycle # 4 today. Labs within normal limits. ca-125- improving; But hold doxil today- given the hand-foot syndrome  # Hand foot- G 1-2; Vaseline/ gloves/udder cream. kenalog cream; HOLD Doxil today.  # Nausea- no vomiting G-1- improved post zofran.  # Discussed with the patient and husband that the treatments are ongoing/indefinit. Also discussed the role of use of PARP inhibitors [Z[place]ula] for maintenance/treatment of ovarian cancer. Patient is in [place]- mutated when she should have a good response/ long duration of response.  # follow up in 2 weeks/ labs/ chemo again 4 weeks/labs/ will add Ca- 125 today. Also discussed the plan with Dr. Theora Gianotti.

## 2016-05-16 NOTE — Progress Notes (Signed)
CT last Friday.  In between fingers and toes and bottoms of feet have redness with bumps a soreness, itching .  Wherever her skin makes contact she is having places appear or "dark spots"

## 2016-05-16 NOTE — Progress Notes (Signed)
Jamie Conner OFFICE PROGRESS NOTE  Patient Care Team: Jerrol Banana., MD as PCP - General (Family Medicine) [facility] Gaetana Michaelis, MD as Referring Physician (Obstetrics and Gynecology) Robert Bellow, MD as Consulting Physician (General Surgery)  Ovarian cancer [facility])   Staging form: Ovary, AJCC 7th Edition     Clinical: Stage IIIC (T3c, N0, M0) - Unsigned    Oncology History   # April- MAY 2016- OVARIAN CANCER STAGE IIIC; CA-125 +2300+;  s/p OPTIMAL DEBULKING SURGERY   # MAY 2016[name] DD [finished in Sep 19th 2017]  # MAY CT 2017- RECURRENT/Peritoneal carcinomatosis/pelvic implant ~1.2cm/Ca-125-34;   # July 3rd- CARBO-DOXIL q 4W [with neulasta]; CT May 11 2016- slight increase peritoneal / stable nodules. Cont chemo.   # G-1-2 hand foot syndrome-   # LEFT BREAST CA s/p Lumpec & RT s/p TAM   # Afib [cardiology]; JUNE 2017- MUGA 62%  MOLECULAR TESTING- ATM genetic mutation      Ovarian cancer ([place])   12/16/2014 Initial Diagnosis    Ovarian cancer       INTERVAL HISTORY:  75 year old female patient with above history of ovarian cancer recurrent Platinum sensitive- currently on Doxil and carboplatin status post cycle 3 is here for follow-up/review the results of the CT scan.   Patient complains of mild-to-moderate pain in her bilateral feet and also hands- 2 or so more weeks after the chemotherapy. Currently improved. Patient has been applying bag balm and moist lotion.  Patient's appetite is good. Denies any abdominal pain. Denies abdominal distention. No chest pain or shortness of breath or cough.   REVIEW OF SYSTEMS:  A complete 10 point review of system is done which is negative except mentioned above/history of present illness.   PAST MEDICAL HISTORY :  Past Medical History:  Diagnosis Date  . Atrial fibrillation ([place])   . Cancer of breast ([place]) 1992   Left  . GERD (gastroesophageal reflux disease)   . Hyperlipidemia   .  Hypothyroidism   . Mitral insufficiency   . Ovarian cancer [facility])    s/p BSO optimal tumor debulking May 2016  . PVC (premature ventricular contraction)     PAST SURGICAL HISTORY :   Past Surgical History:  Procedure Laterality Date  . ABDOMINAL HYSTERECTOMY    . BILATERAL SALPINGOOPHORECTOMY  May 2016   with optimal tumor debulking   . BREAST SURGERY     Left  . CESAREAN SECTION     x2  . CHOLECYSTECTOMY    . COLONOSCOPY  2006  . DEBULKING N/A 12/22/2014   Procedure: DEBULKING;  Surgeon: Gillis Ends, MD;  Location: [facility] ORS;  Service: Gynecology;  Laterality: N/A;  . LAPAROTOMY N/A 12/22/2014   Procedure: EXPLORATORY LAPAROTOMY;  Surgeon: Robert Bellow, MD;  Location: [facility] ORS;  Service: General;  Laterality: N/A;  . LAPAROTOMY WITH STAGING N/A 12/22/2014   Procedure: LAPAROTOMY WITH STAGING;  Surgeon: Gillis Ends, MD;  Location: [facility] ORS;  Service: Gynecology;  Laterality: N/A;  . PERIPHERAL VASCULAR CATHETERIZATION Left 12/22/2014   Procedure: PORTA CATH INSERTION;  Surgeon: Robert Bellow, MD;  Location: [facility] ORS;  Service: General;  Laterality: Left;  . PORTACATH PLACEMENT Right 12/22/2014   Procedure: INSERTION PORT-A-CATH;  Surgeon: Robert Bellow, MD;  Location: [facility] ORS;  Service: General;  Laterality: Right;  . SALPINGOOPHORECTOMY Bilateral 12/22/2014   Procedure: SALPINGO OOPHORECTOMY;  Surgeon: Gillis Ends, MD;  Location: [facility] ORS;  Service: Gynecology;  Laterality: Bilateral;  .  UPPER GI ENDOSCOPY  09/25/04   hiatus hernia, schatzki ring and a single gastric polyp    FAMILY HISTORY :   Family History  Problem Relation Age of Onset  . Cancer Mother     breast, throat, and stomach  . Breast cancer Mother   . Heart disease Father   . Pulmonary embolism Sister   . Cancer Brother 60    angiosarcoma of the chest; carcinoid small intestinal tumor  . Hypertension Brother   . Pulmonary embolism Other   . Cancer Other   . Cancer Maternal  Aunt     breast cancer  . Cancer Maternal Grandfather 51    pancreatic    SOCIAL HISTORY:   Social History  Substance Use Topics  . Smoking status: Never Smoker  . Smokeless tobacco: Never Used  . Alcohol use No    ALLERGIES:  is allergic to carafate [sucralfate] and sulfa antibiotics.  MEDICATIONS:  Current Outpatient Prescriptions  Medication Sig Dispense Refill  . acetaminophen (TYLENOL) 500 MG chewable tablet Chew 500 mg by mouth every 6 (six) hours as needed for pain.    Marland Kitchen lactulose (CHRONULAC) 10 GM/15ML solution Take 15 mLs (10 g total) by mouth 3 (three) times daily. 240 mL 0  . levothyroxine (SYNTHROID, LEVOTHROID) 50 MCG tablet TAKE 1 TABLET EVERY DAY 30 tablet 12  . lidocaine-prilocaine (EMLA) cream APPLY TO AFFECTED AREA IF NEEDED 30 g 1  . LORazepam (ATIVAN) 0.5 MG tablet Take 1 tablet (0.5 mg total) by mouth at bedtime. 30 tablet 3  . mometasone (ELOCON) 0.1 % cream Apply 1 application topically daily. Apply to affected area no more than 2 weeks a month. 45 g 0  . ondansetron (ZOFRAN) 4 MG tablet Take 4 mg by mouth every 6 (six) hours as needed for nausea or vomiting. Reported on 02/20/2016    . pantoprazole (PROTONIX) 40 MG tablet TAKE 1 TABLET EVERY DAY 30 tablet 12  . polyethylene glycol (MIRALAX / GLYCOLAX) packet Take 17 g by mouth daily.    . propafenone (RYTHMOL) 225 MG tablet Take 225 mg by mouth 2 (two) times daily.    . psyllium (METAMUCIL) 58.6 % powder Take 1 packet by mouth daily.    Marland Kitchen senna (SENOKOT) 8.6 MG TABS tablet Take 2 tablets by mouth daily.    Marland Kitchen triamcinolone ointment (KENALOG) 0.5 % Apply 1 application topically 2 (two) times daily. 30 g 0   No current facility-administered medications for this visit.    Facility-Administered Medications Ordered in Other Visits  Medication Dose Route Frequency Provider Last Rate Last Dose  . 0.9 %  sodium chloride infusion   Intravenous Continuous Cammie Sickle, MD 10 mL/hr at 05/16/16 1130    . sodium  chloride 0.9 % injection 10 mL  10 mL Intracatheter PRN Forest Gleason, MD   10 mL at 02/07/15 0930    PHYSICAL EXAMINATION: ECOG PERFORMANCE STATUS: 0 - Asymptomatic  BP 136/89 (BP Location: Left Arm, Patient Position: Sitting)   Pulse 81   Temp (!) 96.3 F (35.7 C) (Tympanic)   Resp 17   Ht 5' 3.5" (1.613 m)   Wt 182 lb 3.2 oz (82.6 kg)   BMI 31.77 kg/m   Filed Weights   05/16/16 1007  Weight: 182 lb 3.2 oz (82.6 kg)    GENERAL: Well-nourished well-developed; Alert, no distress and comfortAccompanied by her husband. EYES: no pallor or icterus OROPHARYNX: no thrush or ulceration; good dentition  NECK: supple, no masses felt LYMPH:  no palpable lymphadenopathy in the cervical, axillary or inguinal regions LUNGS: clear to auscultation and  No wheeze or crackles HEART/CVS: regular rate & rhythm and no murmurs; No lower extremity edema ABDOMEN:abdomen soft, non-tender and normal bowel sounds Musculoskeletal:no cyanosis of digits and no clubbing  PSYCH: alert & oriented x 3 with fluent speech NEURO: no focal motor/sensory deficits SKIN:  Mild erythema noted bilateral upper extremities and feet. No desquamation.  LABORATORY DATA:  I have reviewed the data as listed    Component Value Date/Time   NA 139 05/16/2016 0949   NA 140 12/14/2014 1037   K 4.1 05/16/2016 0949   K 4.4 12/14/2014 1037   CL 108 05/16/2016 0949   CL 106 12/14/2014 1037   CO2 26 05/16/2016 0949   CO2 26 12/14/2014 1037   GLUCOSE 98 05/16/2016 0949   GLUCOSE 87 12/14/2014 1037   BUN 14 05/16/2016 0949   BUN 12 12/14/2014 1037   CREATININE 0.84 05/16/2016 0949   CREATININE 0.97 12/14/2014 1037   CALCIUM 9.2 05/16/2016 0949   CALCIUM 9.2 12/14/2014 1037   PROT 6.7 05/16/2016 0949   PROT 7.3 12/14/2014 1037   ALBUMIN 3.6 05/16/2016 0949   ALBUMIN 3.8 12/14/2014 1037   AST 21 05/16/2016 0949   AST 20 12/14/2014 1037   ALT 13 (L) 05/16/2016 0949   ALT 11 (L) 12/14/2014 1037   ALKPHOS 71 05/16/2016  0949   ALKPHOS 138 (H) 12/14/2014 1037   BILITOT 0.5 05/16/2016 0949   BILITOT 0.5 12/14/2014 1037   GFRNONAA >60 05/16/2016 0949   GFRNONAA 59 (L) 12/14/2014 1037   GFRAA >60 05/16/2016 0949   GFRAA >60 12/14/2014 1037    No results found for: SPEP, UPEP  Lab Results  Component Value Date   WBC 4.8 05/16/2016   NEUTROABS 1.8 05/16/2016   HGB 12.7 05/16/2016   HCT 36.6 05/16/2016   MCV 98.0 05/16/2016   PLT 291 05/16/2016      Chemistry      Component Value Date/Time   NA 139 05/16/2016 0949   NA 140 12/14/2014 1037   K 4.1 05/16/2016 0949   K 4.4 12/14/2014 1037   CL 108 05/16/2016 0949   CL 106 12/14/2014 1037   CO2 26 05/16/2016 0949   CO2 26 12/14/2014 1037   BUN 14 05/16/2016 0949   BUN 12 12/14/2014 1037   CREATININE 0.84 05/16/2016 0949   CREATININE 0.97 12/14/2014 1037   GLU 84 03/16/2014      Component Value Date/Time   CALCIUM 9.2 05/16/2016 0949   CALCIUM 9.2 12/14/2014 1037   ALKPHOS 71 05/16/2016 0949   ALKPHOS 138 (H) 12/14/2014 1037   AST 21 05/16/2016 0949   AST 20 12/14/2014 1037   ALT 13 (L) 05/16/2016 0949   ALT 11 (L) 12/14/2014 1037   BILITOT 0.5 05/16/2016 0949   BILITOT 0.5 12/14/2014 1037     abdomen or pelvis. Peritoneal deposits within the pelvis are again noted. Index implant within the right side of pelvis measures 1.5 cm, image 72 of series 2. Previously 1.2 cm. Adjacent nodule measures 0.9 cm, image 72 of series 2. Previously 0.6 cm. Lastly, index peritoneal deposit measures 1.4 cm, image 71 of series 2. Unchanged from previous exam.  Musculoskeletal: No aggressive lytic or sclerotic bone lesions.  IMPRESSION: 1. No acute findings identified within the abdomen or pelvis. 2. Peritoneal nodules within the right side of pelvis are stable the slightly increased in size from previous exam. No new  areas of disease identified.   Electronically Signed   By: Kerby Moors M.D.   On: 05/11/2016 10:57  RADIOGRAPHIC  STUDIES: I have personally reviewed the radiological images as listed and agreed with the findings in the report. No results found.   ASSESSMENT & PLAN:  Ovarian cancer ([place]) Recurrent ovarian cancer- Platinum sensitive. S/p  #3 Doxil- carbo q 4 weeks. CT scan shows slight increase in the size of the peritoneal nodules [as described above]; however CA-125 improving. Awaits even 25 from today.   # Also discussed that the discrepancy with the timing of her CT scan/May 23; and starting of chemotherapy on July 3 might also affect the reading of the CT scan after 3 cycles/September 2017.   # Proceed with cycle # 4 today. Labs within normal limits. ca-125- improving; But hold doxil today- given the hand-foot syndrome  # Hand foot- G 1-2; Vaseline/ gloves/udder cream. kenalog cream; HOLD Doxil today.  # Nausea- no vomiting G-1- improved post zofran.  # Discussed with the patient and husband that the treatments are ongoing/indefinit. Also discussed the role of use of PARP inhibitors [[place]] for maintenance/treatment of ovarian cancer. Patient is in [place]- mutated when she should have a good response/ long duration of response.  # follow up in 2 weeks/ labs/ chemo again 4 weeks/labs/ will add Ca- 125 today. Also discussed the plan with Dr. Theora Gianotti.   # I reviewed the blood work- with the patient in detail; also reviewed the imaging independently [as summarized above]; and with the patient in detail.   Orders Placed This Encounter  Procedures  . CBC with Differential    Standing Status:   Future    Standing Expiration Date:   05/16/2017  . Basic metabolic panel    Standing Status:   Future    Standing Expiration Date:   05/16/2017  . CBC with Differential    Standing Status:   Future    Standing Expiration Date:   05/16/2017  . Comprehensive metabolic panel    Standing Status:   Future    Standing Expiration Date:   05/16/2017  . CA 125    Standing Status:   Future    Number of Occurrences:   1     Standing Expiration Date:   05/16/2017  . CA 125    Standing Status:   Future    Standing Expiration Date:   06/20/2017   All questions were answered. The patient knows to call the clinic with any problems, questions or concerns.      Cammie Sickle, MD 05/16/2016 1:18 PM

## 2016-05-17 LAB — CA 125: CA 125: 37.5 U/mL (ref 0.0–38.1)

## 2016-05-30 ENCOUNTER — Inpatient Hospital Stay: Payer: PPO | Attending: Internal Medicine

## 2016-05-30 DIAGNOSIS — R5383 Other fatigue: Secondary | ICD-10-CM | POA: Diagnosis not present

## 2016-05-30 DIAGNOSIS — Z800 Family history of malignant neoplasm of digestive organs: Secondary | ICD-10-CM | POA: Insufficient documentation

## 2016-05-30 DIAGNOSIS — Z7689 Persons encountering health services in other specified circumstances: Secondary | ICD-10-CM | POA: Diagnosis not present

## 2016-05-30 DIAGNOSIS — Z853 Personal history of malignant neoplasm of breast: Secondary | ICD-10-CM | POA: Insufficient documentation

## 2016-05-30 DIAGNOSIS — Z5111 Encounter for antineoplastic chemotherapy: Secondary | ICD-10-CM | POA: Diagnosis not present

## 2016-05-30 DIAGNOSIS — R531 Weakness: Secondary | ICD-10-CM | POA: Insufficient documentation

## 2016-05-30 DIAGNOSIS — Z79899 Other long term (current) drug therapy: Secondary | ICD-10-CM | POA: Diagnosis not present

## 2016-05-30 DIAGNOSIS — Z803 Family history of malignant neoplasm of breast: Secondary | ICD-10-CM | POA: Insufficient documentation

## 2016-05-30 DIAGNOSIS — I340 Nonrheumatic mitral (valve) insufficiency: Secondary | ICD-10-CM | POA: Insufficient documentation

## 2016-05-30 DIAGNOSIS — E039 Hypothyroidism, unspecified: Secondary | ICD-10-CM | POA: Insufficient documentation

## 2016-05-30 DIAGNOSIS — C569 Malignant neoplasm of unspecified ovary: Secondary | ICD-10-CM | POA: Diagnosis not present

## 2016-05-30 DIAGNOSIS — C786 Secondary malignant neoplasm of retroperitoneum and peritoneum: Secondary | ICD-10-CM | POA: Insufficient documentation

## 2016-05-30 DIAGNOSIS — E785 Hyperlipidemia, unspecified: Secondary | ICD-10-CM | POA: Insufficient documentation

## 2016-05-30 DIAGNOSIS — L271 Localized skin eruption due to drugs and medicaments taken internally: Secondary | ICD-10-CM | POA: Diagnosis not present

## 2016-05-30 DIAGNOSIS — I4891 Unspecified atrial fibrillation: Secondary | ICD-10-CM | POA: Insufficient documentation

## 2016-05-30 DIAGNOSIS — I493 Ventricular premature depolarization: Secondary | ICD-10-CM | POA: Diagnosis not present

## 2016-05-30 DIAGNOSIS — K219 Gastro-esophageal reflux disease without esophagitis: Secondary | ICD-10-CM | POA: Insufficient documentation

## 2016-05-30 LAB — CBC WITH DIFFERENTIAL/PLATELET
BASOS ABS: 0 10*3/uL (ref 0–0.1)
BASOS PCT: 1 %
EOS ABS: 0 10*3/uL (ref 0–0.7)
Eosinophils Relative: 1 %
HCT: 35.1 % (ref 35.0–47.0)
Hemoglobin: 12.1 g/dL (ref 12.0–16.0)
Lymphocytes Relative: 41 %
Lymphs Abs: 2.1 10*3/uL (ref 1.0–3.6)
MCH: 34.2 pg — ABNORMAL HIGH (ref 26.0–34.0)
MCHC: 34.5 g/dL (ref 32.0–36.0)
MCV: 99.1 fL (ref 80.0–100.0)
MONO ABS: 0.6 10*3/uL (ref 0.2–0.9)
MONOS PCT: 11 %
Neutro Abs: 2.4 10*3/uL (ref 1.4–6.5)
Neutrophils Relative %: 46 %
PLATELETS: 173 10*3/uL (ref 150–440)
RBC: 3.55 MIL/uL — ABNORMAL LOW (ref 3.80–5.20)
RDW: 16.9 % — AB (ref 11.5–14.5)
WBC: 5.1 10*3/uL (ref 3.6–11.0)

## 2016-05-30 LAB — BASIC METABOLIC PANEL
ANION GAP: 7 (ref 5–15)
BUN: 13 mg/dL (ref 6–20)
CALCIUM: 9.1 mg/dL (ref 8.9–10.3)
CO2: 27 mmol/L (ref 22–32)
CREATININE: 0.93 mg/dL (ref 0.44–1.00)
Chloride: 105 mmol/L (ref 101–111)
GFR, EST NON AFRICAN AMERICAN: 60 mL/min — AB (ref >60)
Glucose, Bld: 89 mg/dL (ref 65–99)
Potassium: 3.7 mmol/L (ref 3.5–5.1)
SODIUM: 139 mmol/L (ref 135–145)

## 2016-06-06 ENCOUNTER — Other Ambulatory Visit: Payer: Self-pay | Admitting: Internal Medicine

## 2016-06-06 DIAGNOSIS — Z1231 Encounter for screening mammogram for malignant neoplasm of breast: Secondary | ICD-10-CM

## 2016-06-11 ENCOUNTER — Ambulatory Visit (INDEPENDENT_AMBULATORY_CARE_PROVIDER_SITE_OTHER): Payer: PPO | Admitting: Family Medicine

## 2016-06-11 ENCOUNTER — Encounter: Payer: Self-pay | Admitting: Family Medicine

## 2016-06-11 VITALS — BP 114/72 | HR 72 | Temp 98.3°F | Resp 16 | Wt 185.0 lb

## 2016-06-11 DIAGNOSIS — K219 Gastro-esophageal reflux disease without esophagitis: Secondary | ICD-10-CM | POA: Diagnosis not present

## 2016-06-11 DIAGNOSIS — E039 Hypothyroidism, unspecified: Secondary | ICD-10-CM | POA: Diagnosis not present

## 2016-06-11 DIAGNOSIS — C569 Malignant neoplasm of unspecified ovary: Secondary | ICD-10-CM | POA: Diagnosis not present

## 2016-06-11 MED ORDER — LEVOTHYROXINE SODIUM 50 MCG PO TABS: 50.0000 ug | ORAL_TABLET | Freq: Every day | ORAL | 12 refills | Status: DC

## 2016-06-11 MED ORDER — PANTOPRAZOLE SODIUM 40 MG PO TBEC: 40.0000 mg | DELAYED_RELEASE_TABLET | Freq: Every day | ORAL | 12 refills | Status: DC

## 2016-06-11 MED ORDER — VITAMIN D 50 MCG (2000 UT) PO TABS: 2000.0000 [IU] | ORAL_TABLET | Freq: Every day | ORAL | 12 refills | Status: DC

## 2016-06-11 NOTE — Patient Instructions (Signed)
Start 2000 units of Vitamin D over the counter daily.

## 2016-06-11 NOTE — Progress Notes (Signed)
Patient: Jamie Conner Female    DOB: May 28, 1941   75 y.o.   MRN: [id number] Visit Date: 06/11/2016  Today's Provider: Wilhemena Durie, MD   Chief Complaint  Patient presents with  . Gastroesophageal Reflux  . Hypothyroidism  . Ovarian Cancer   Subjective:    Gastroesophageal Reflux  She complains of early satiety. She reports no abdominal pain, no belching, no chest pain, no choking, no coughing, no dysphagia, no globus sensation, no heartburn, no hoarse voice, no sore throat, no stridor, no water brash or no wheezing. This is a chronic problem. The problem has been gradually improving. Exacerbated by: cancer treatment. She has tried a PPI (Protonix 40 mg) for the symptoms. The treatment provided moderate relief.      Hypothyroid, follow-up:  TSH  Date Value Ref Range Status  12/16/2015 3.520 0.450 - 4.500 uIU/mL Final  04/10/2015 1.531 0.350 - 4.500 uIU/mL Final  03/16/2014 3.97 0.41 - 5.90 uIU/mL Final   Wt Readings from Last 3 Encounters:  06/11/16 185 lb (83.9 kg)  05/16/16 182 lb 3.2 oz (82.6 kg)  04/18/16 183 lb (83 kg)    She was last seen for hypothyroid 6 months ago.  Management since that visit includes continue current medications (50 mcg). She reports excellent compliance with treatment. She is not having side effects.  She is exercising. Walks when pt feels up to it. She is experiencing weight changes. Up 2 pounds per pt. She denies diarrhea, nervousness and palpitations Weight trend: stable  ------------------------------------------------------------------------   Ovarian Cancer Pt being F/B Dr. Jacinto Reap at [facility] for this. Is currently undergoing second round of treatment.  Allergies  Allergen Reactions  . Carafate [Sucralfate] Rash  . Sulfa Antibiotics Rash     Current Outpatient Prescriptions:  .  acetaminophen (TYLENOL) 500 MG chewable tablet, Chew 500 mg by mouth every 6 (six) hours as needed for pain., Disp: , Rfl:  .   chlorhexidine (PERIDEX) 0.12 % solution, , Disp: , Rfl:  .  lactulose (CHRONULAC) 10 GM/15ML solution, Take 15 mLs (10 g total) by mouth 3 (three) times daily., Disp: 240 mL, Rfl: 0 .  levothyroxine (SYNTHROID, LEVOTHROID) 50 MCG tablet, TAKE 1 TABLET EVERY DAY, Disp: 30 tablet, Rfl: 12 .  lidocaine-prilocaine (EMLA) cream, APPLY TO AFFECTED AREA IF NEEDED, Disp: 30 g, Rfl: 1 .  LORazepam (ATIVAN) 0.5 MG tablet, Take 1 tablet (0.5 mg total) by mouth at bedtime., Disp: 30 tablet, Rfl: 3 .  mometasone (ELOCON) 0.1 % cream, Apply 1 application topically daily. Apply to affected area no more than 2 weeks a month., Disp: 45 g, Rfl: 0 .  Multiple Vitamin (MULTIVITAMIN) capsule, Take 1 capsule by mouth daily., Disp: , Rfl:  .  ondansetron (ZOFRAN) 4 MG tablet, Take 4 mg by mouth every 6 (six) hours as needed for nausea or vomiting. Reported on 02/20/2016, Disp: , Rfl:  .  pantoprazole (PROTONIX) 40 MG tablet, TAKE 1 TABLET EVERY DAY, Disp: 30 tablet, Rfl: 12 .  polyethylene glycol (MIRALAX / GLYCOLAX) packet, Take 17 g by mouth daily., Disp: , Rfl:  .  propafenone (RYTHMOL) 225 MG tablet, Take 225 mg by mouth 2 (two) times daily., Disp: , Rfl:  .  psyllium (METAMUCIL) 58.6 % powder, Take 1 packet by mouth daily., Disp: , Rfl:  .  senna (SENOKOT) 8.6 MG TABS tablet, Take 2 tablets by mouth daily., Disp: , Rfl:  .  triamcinolone ointment (KENALOG) 0.5 %, Apply  1 application topically 2 (two) times daily., Disp: 30 g, Rfl: 0 No current facility-administered medications for this visit.   Facility-Administered Medications Ordered in Other Visits:  .  sodium chloride 0.9 % injection 10 mL, 10 mL, Intracatheter, PRN, Forest Gleason, MD, 10 mL at 02/07/15 0930  Review of Systems  Constitutional: Positive for activity change (due to cancer treatment).  HENT: Negative for hoarse voice and sore throat.   Respiratory: Negative for cough, choking and wheezing.   Cardiovascular: Negative for chest pain.    Gastrointestinal: Negative for abdominal pain, dysphagia and heartburn.  Endocrine: Positive for heat intolerance (due to cancer treatment).  Allergic/Immunologic: Negative.   Neurological: Negative.   Hematological: Negative.   Psychiatric/Behavioral: Negative.     Social History  Substance Use Topics  . Smoking status: Never Smoker  . Smokeless tobacco: Never Used  . Alcohol use No   Objective:   BP 114/72 (BP Location: Left Arm, Patient Position: Sitting, Cuff Size: Large)   Pulse 72   Temp 98.3 F (36.8 C) (Oral)   Resp 16   Wt 185 lb (83.9 kg)   BMI 32.26 kg/m   Physical Exam  Constitutional: She is oriented to person, place, and time. She appears well-developed and well-nourished.  HENT:  Head: Normocephalic and atraumatic.  Right Ear: External ear normal.  Left Ear: External ear normal.  Nose: Nose normal.  Eyes: Conjunctivae are normal. Pupils are equal, round, and reactive to light. Right eye exhibits no discharge. Left eye exhibits no discharge.  Neck: Normal range of motion. Neck supple. No thyromegaly present.  Cardiovascular: Normal rate, regular rhythm and normal heart sounds.   Pulmonary/Chest: Effort normal and breath sounds normal. No respiratory distress.  Abdominal: Soft. There is no tenderness.  Neurological: She is alert and oriented to person, place, and time. No cranial nerve deficit. She exhibits normal muscle tone. Coordination normal.  Skin: Skin is warm and dry.  Psychiatric: She has a normal mood and affect. Her behavior is normal. Judgment and thought content normal.        Assessment & Plan:     1. Gastroesophageal reflux disease, esophagitis presence not specified Continue Protonix. Refills provided. - pantoprazole (PROTONIX) 40 MG tablet; Take 1 tablet (40 mg total) by mouth daily.  Dispense: 30 tablet; Refill: 12  2. Hypothyroidism, unspecified type Will check TSH today. FU pending results. Otherwise continue current dose of  levothyroxine. - TSH - levothyroxine (SYNTHROID, LEVOTHROID) 50 MCG tablet; Take 1 tablet (50 mcg total) by mouth daily.  Dispense: 30 tablet; Refill: 12  3. Malignant neoplasm of ovary, unspecified laterality ([place]) Stable. Continue to FU with oncology as scheduled. Will get flu vaccine at oncologist.    Patient seen and examined by Miguel Aschoff, MD, and note scribed by Renaldo Fiddler, CMA.  I have done the exam and reviewed the above chart and it is accurate to the best of my knowledge.  Takesha Steger Cranford Mon, MD  [facility]

## 2016-06-12 ENCOUNTER — Telehealth: Payer: Self-pay | Admitting: Family Medicine

## 2016-06-12 LAB — TSH: TSH: 2.78 u[IU]/mL (ref 0.450–4.500)

## 2016-06-12 NOTE — Telephone Encounter (Signed)
Pt advised of lab results-aa 

## 2016-06-12 NOTE — Progress Notes (Signed)
[place] OFFICE PROGRESS NOTE  Patient Care Team: Jerrol Banana., MD as PCP - General (Family Medicine) [facility] Gaetana Michaelis, MD as Referring Physician (Obstetrics and Gynecology) Robert Bellow, MD as Consulting Physician (General Surgery)  Ovarian cancer [facility])   Staging form: Ovary, AJCC 7th Edition     Clinical: Stage IIIC (T3c, N0, M0) - Unsigned    Oncology History   # April- MAY 2016- OVARIAN CANCER STAGE IIIC; CA-125 +2300+;  s/p OPTIMAL DEBULKING SURGERY   # MAY 2016[name] [finished in Sep 19th 2017]  # MAY CT 2017- RECURRENT/Peritoneal carcinomatosis/pelvic implant ~1.2cm/Ca-125-34;   # July 3rd- CARBO-DOXIL q 4W [with neulasta]; CT May 11 2016- slight increase peritoneal / stable nodules. Cont chemo.   # G-1-2 hand foot syndrome-   # LEFT BREAST CA s/p Lumpec & RT s/p TAM   # Afib [cardiology]; JUNE 2017- MUGA 62%  MOLECULAR TESTING- ATM genetic mutation      Ovarian cancer ([place])   12/16/2014 Initial Diagnosis    Ovarian cancer       INTERVAL HISTORY:  75 year old female patient with above history of ovarian cancer recurrent Platinum sensitive, Returns to clinic for further evaluation and consideration of cycle 5 of liposomal doxorubicin and carboplatinum.  The blistering on her hands and feet have completely resolved. She has mild weakness and fatigue, but otherwise feels well and is back to her baseline. She has no neurologic complaints. She denies any fevers. She denies any chest pain or shortness of breath. She has no nausea, vomiting, constipation, or diarrhea. She has no urinary complaints. Patient offers no further specific complaints today.   REVIEW OF SYSTEMS:  A complete 10 point review of system is done which is negative except mentioned above/history of present illness.   PAST MEDICAL HISTORY :  Past Medical History:  Diagnosis Date  . Atrial fibrillation ([place])   . Cancer of breast ([place]) 1992   Left  . GERD  (gastroesophageal reflux disease)   . Hyperlipidemia   . Hypothyroidism   . Mitral insufficiency   . Ovarian cancer [facility])    s/p BSO optimal tumor debulking May 2016  . PVC (premature ventricular contraction)     PAST SURGICAL HISTORY :   Past Surgical History:  Procedure Laterality Date  . ABDOMINAL HYSTERECTOMY    . BILATERAL SALPINGOOPHORECTOMY  May 2016   with optimal tumor debulking   . BREAST SURGERY     Left  . CESAREAN SECTION     x2  . CHOLECYSTECTOMY    . COLONOSCOPY  2006  . DEBULKING N/A 12/22/2014   Procedure: DEBULKING;  Surgeon: Gillis Ends, MD;  Location: [facility] ORS;  Service: Gynecology;  Laterality: N/A;  . LAPAROTOMY N/A 12/22/2014   Procedure: EXPLORATORY LAPAROTOMY;  Surgeon: Robert Bellow, MD;  Location: [facility] ORS;  Service: General;  Laterality: N/A;  . LAPAROTOMY WITH STAGING N/A 12/22/2014   Procedure: LAPAROTOMY WITH STAGING;  Surgeon: Gillis Ends, MD;  Location: [facility] ORS;  Service: Gynecology;  Laterality: N/A;  . PERIPHERAL VASCULAR CATHETERIZATION Left 12/22/2014   Procedure: PORTA CATH INSERTION;  Surgeon: Robert Bellow, MD;  Location: [facility] ORS;  Service: General;  Laterality: Left;  . PORTACATH PLACEMENT Right 12/22/2014   Procedure: INSERTION PORT-A-CATH;  Surgeon: Robert Bellow, MD;  Location: [facility] ORS;  Service: General;  Laterality: Right;  . SALPINGOOPHORECTOMY Bilateral 12/22/2014   Procedure: SALPINGO OOPHORECTOMY;  Surgeon: Gillis Ends, MD;  Location: [facility] ORS;  Service: Gynecology;  Laterality: Bilateral;  . UPPER GI ENDOSCOPY  09/25/04   hiatus hernia, schatzki ring and a single gastric polyp    FAMILY HISTORY :   Family History  Problem Relation Age of Onset  . Cancer Mother     breast, throat, and stomach  . Breast cancer Mother   . Heart disease Father   . Pulmonary embolism Sister   . Cancer Brother 60    angiosarcoma of the chest; carcinoid small intestinal tumor  . Hypertension Brother   .  Pulmonary embolism Other   . Cancer Other   . Cancer Maternal Aunt     breast cancer  . Cancer Maternal Grandfather 12    pancreatic    SOCIAL HISTORY:   Social History  Substance Use Topics  . Smoking status: Never Smoker  . Smokeless tobacco: Never Used  . Alcohol use No    ALLERGIES:  is allergic to carafate [sucralfate] and sulfa antibiotics.  MEDICATIONS:  Current Outpatient Prescriptions  Medication Sig Dispense Refill  . acetaminophen (TYLENOL) 500 MG chewable tablet Chew 500 mg by mouth every 6 (six) hours as needed for pain.    . chlorhexidine (PERIDEX) 0.12 % solution     . lactulose (CHRONULAC) 10 GM/15ML solution Take 15 mLs (10 g total) by mouth 3 (three) times daily. 240 mL 0  . levothyroxine (SYNTHROID, LEVOTHROID) 50 MCG tablet Take 1 tablet (50 mcg total) by mouth daily. 30 tablet 12  . lidocaine-prilocaine (EMLA) cream APPLY TO AFFECTED AREA IF NEEDED 30 g 1  . LORazepam (ATIVAN) 0.5 MG tablet Take 1 tablet (0.5 mg total) by mouth at bedtime. 30 tablet 3  . Multiple Vitamin (MULTIVITAMIN) capsule Take 1 capsule by mouth daily.    . ondansetron (ZOFRAN) 4 MG tablet Take 4 mg by mouth every 6 (six) hours as needed for nausea or vomiting. Reported on 02/20/2016    . pantoprazole (PROTONIX) 40 MG tablet Take 1 tablet (40 mg total) by mouth daily. 30 tablet 12  . polyethylene glycol (MIRALAX / GLYCOLAX) packet Take 17 g by mouth daily.    . propafenone (RYTHMOL) 225 MG tablet Take 225 mg by mouth 2 (two) times daily.    . psyllium (METAMUCIL) 58.6 % powder Take 1 packet by mouth daily.    Marland Kitchen senna (SENOKOT) 8.6 MG TABS tablet Take 2 tablets by mouth daily.    . Cholecalciferol (VITAMIN D) 2000 units tablet Take 1 tablet (2,000 Units total) by mouth daily. (Patient not taking: Reported on 06/13/2016) 30 tablet 12  . mometasone (ELOCON) 0.1 % cream Apply 1 application topically daily. Apply to affected area no more than 2 weeks a month. (Patient not taking: Reported on  06/13/2016) 45 g 0  . triamcinolone ointment (KENALOG) 0.5 % Apply 1 application topically 2 (two) times daily. (Patient not taking: Reported on 06/13/2016) 30 g 0   No current facility-administered medications for this visit.    Facility-Administered Medications Ordered in Other Visits  Medication Dose Route Frequency Provider Last Rate Last Dose  . sodium chloride 0.9 % injection 10 mL  10 mL Intracatheter PRN Forest Gleason, MD   10 mL at 02/07/15 0930    PHYSICAL EXAMINATION: ECOG PERFORMANCE STATUS: 0 - Asymptomatic  BP 117/78 (BP Location: Left Arm, Patient Position: Sitting)   Pulse 71   Temp (!) 95.5 F (35.3 C) (Tympanic)   Resp 18   Ht 5' 3.5" (1.613 m)   Wt 182 lb 5.1 oz (82.7 kg)  BMI 31.79 kg/m   Filed Weights   06/13/16 1034  Weight: 182 lb 5.1 oz (82.7 kg)    GENERAL: Well-nourished well-developed; Alert, no distress and comfortAccompanied by her husband. EYES: no pallor or icterus OROPHARYNX: no thrush or ulceration; good dentition  NECK: supple, no masses felt LYMPH:  no palpable lymphadenopathy in the cervical, axillary or inguinal regions LUNGS: clear to auscultation and  No wheeze or crackles HEART/CVS: regular rate & rhythm and no murmurs; No lower extremity edema ABDOMEN:abdomen soft, non-tender and normal bowel sounds Musculoskeletal:no cyanosis of digits and no clubbing  PSYCH: alert & oriented x 3 with fluent speech NEURO: no focal motor/sensory deficits SKIN:  Rash resolved.   LABORATORY DATA:      Component Value Date/Time   NA 140 06/13/2016 0951   NA 140 12/14/2014 1037   K 3.9 06/13/2016 0951   K 4.4 12/14/2014 1037   CL 107 06/13/2016 0951   CL 106 12/14/2014 1037   CO2 26 06/13/2016 0951   CO2 26 12/14/2014 1037   GLUCOSE 110 (H) 06/13/2016 0951   GLUCOSE 87 12/14/2014 1037   BUN 14 06/13/2016 0951   BUN 12 12/14/2014 1037   CREATININE 0.89 06/13/2016 0951   CREATININE 0.97 12/14/2014 1037   CALCIUM 9.2 06/13/2016 0951    CALCIUM 9.2 12/14/2014 1037   PROT 6.7 06/13/2016 0951   PROT 7.3 12/14/2014 1037   ALBUMIN 3.6 06/13/2016 0951   ALBUMIN 3.8 12/14/2014 1037   AST 23 06/13/2016 0951   AST 20 12/14/2014 1037   ALT 15 06/13/2016 0951   ALT 11 (L) 12/14/2014 1037   ALKPHOS 73 06/13/2016 0951   ALKPHOS 138 (H) 12/14/2014 1037   BILITOT 0.6 06/13/2016 0951   BILITOT 0.5 12/14/2014 1037   GFRNONAA >60 06/13/2016 0951   GFRNONAA 59 (L) 12/14/2014 1037   GFRAA >60 06/13/2016 0951   GFRAA >60 12/14/2014 1037    No results found for: SPEP, UPEP  Lab Results  Component Value Date   WBC 4.1 06/13/2016   NEUTROABS 1.9 06/13/2016   HGB 12.9 06/13/2016   HCT 36.8 06/13/2016   MCV 102.3 (H) 06/13/2016   PLT 208 06/13/2016      Chemistry      Component Value Date/Time   NA 140 06/13/2016 0951   NA 140 12/14/2014 1037   K 3.9 06/13/2016 0951   K 4.4 12/14/2014 1037   CL 107 06/13/2016 0951   CL 106 12/14/2014 1037   CO2 26 06/13/2016 0951   CO2 26 12/14/2014 1037   BUN 14 06/13/2016 0951   BUN 12 12/14/2014 1037   CREATININE 0.89 06/13/2016 0951   CREATININE 0.97 12/14/2014 1037   GLU 84 03/16/2014      Component Value Date/Time   CALCIUM 9.2 06/13/2016 0951   CALCIUM 9.2 12/14/2014 1037   ALKPHOS 73 06/13/2016 0951   ALKPHOS 138 (H) 12/14/2014 1037   AST 23 06/13/2016 0951   AST 20 12/14/2014 1037   ALT 15 06/13/2016 0951   ALT 11 (L) 12/14/2014 1037   BILITOT 0.6 06/13/2016 0951   BILITOT 0.5 12/14/2014 1037     abdomen or pelvis. Peritoneal deposits within the pelvis are again noted. Index implant within the right side of pelvis measures 1.5 cm, image 72 of series 2. Previously 1.2 cm. Adjacent nodule measures 0.9 cm, image 72 of series 2. Previously 0.6 cm. Lastly, index peritoneal deposit measures 1.4 cm, image 71 of series 2. Unchanged from previous exam.  Musculoskeletal:  No aggressive lytic or sclerotic bone lesions.  IMPRESSION: 1. No acute findings identified within  the abdomen or pelvis. 2. Peritoneal nodules within the right side of pelvis are stable the slightly increased in size from previous exam. No new areas of disease identified.   Electronically Signed   By: Kerby Moors M.D.   On: 05/11/2016 10:57  RADIOGRAPHIC STUDIES: I have personally reviewed the radiological images as listed and agreed with the findings in the report. No results found.   ASSESSMENT & PLAN:  Recurrent ovarian cancer- Platinum sensitive.Proceed with cycle 5 of liposomal doxorubicin and carboplatinum today. Patient's most recent CT scan revealed slight increase in the size of the peritoneal nodules [as described above]; CA-125 has trended up slightly and is now 44.8.    # Also discussed that the discrepancy with the timing of her CT scan/May 23; and starting of chemotherapy on July 3 might also affect the reading of the CT scan after 3 cycles/September 2017.   # Proceed with cycle # 5 today liposomal Doxil has been added back in. Labs within normal limits.   # Hand foot- G 1-2;  resolved.   # Nausea- no vomiting G-1- improved post zofran.  # Discussed with the patient and husband that the treatments are ongoing/indefinit. Also discussed the role of use of PARP inhibitors [[place]] for maintenance/treatment of ovarian cancer. Patient is in [place]- mutated when she should have a good response/ long duration of response.  # follow up in 4 weeks for further evaluation, laboratory work, and consideration of cycle 6. With CA-125 rising, patient may require a change in treatment in the near future.  No orders of the defined types were placed in this encounter.  All questions were answered. The patient knows to call the clinic with any problems, questions or concerns.      Lloyd Huger, MD 06/17/2016 3:51 PM

## 2016-06-12 NOTE — Telephone Encounter (Signed)
Pt returned anna's call.  Please call cell [phone]  Thanks Con Memos

## 2016-06-13 ENCOUNTER — Encounter: Payer: Self-pay | Admitting: Oncology

## 2016-06-13 ENCOUNTER — Inpatient Hospital Stay: Payer: PPO

## 2016-06-13 ENCOUNTER — Inpatient Hospital Stay (HOSPITAL_BASED_OUTPATIENT_CLINIC_OR_DEPARTMENT_OTHER): Payer: PPO

## 2016-06-13 ENCOUNTER — Inpatient Hospital Stay (HOSPITAL_BASED_OUTPATIENT_CLINIC_OR_DEPARTMENT_OTHER): Payer: PPO | Admitting: Oncology

## 2016-06-13 VITALS — BP 117/78 | HR 71 | Temp 95.5°F | Resp 18 | Ht 63.5 in | Wt 182.3 lb

## 2016-06-13 DIAGNOSIS — C569 Malignant neoplasm of unspecified ovary: Secondary | ICD-10-CM

## 2016-06-13 DIAGNOSIS — E039 Hypothyroidism, unspecified: Secondary | ICD-10-CM

## 2016-06-13 DIAGNOSIS — Z853 Personal history of malignant neoplasm of breast: Secondary | ICD-10-CM | POA: Diagnosis not present

## 2016-06-13 DIAGNOSIS — I340 Nonrheumatic mitral (valve) insufficiency: Secondary | ICD-10-CM

## 2016-06-13 DIAGNOSIS — C786 Secondary malignant neoplasm of retroperitoneum and peritoneum: Secondary | ICD-10-CM | POA: Diagnosis not present

## 2016-06-13 DIAGNOSIS — L271 Localized skin eruption due to drugs and medicaments taken internally: Secondary | ICD-10-CM | POA: Diagnosis not present

## 2016-06-13 DIAGNOSIS — K219 Gastro-esophageal reflux disease without esophagitis: Secondary | ICD-10-CM

## 2016-06-13 DIAGNOSIS — R5383 Other fatigue: Secondary | ICD-10-CM

## 2016-06-13 DIAGNOSIS — I493 Ventricular premature depolarization: Secondary | ICD-10-CM

## 2016-06-13 DIAGNOSIS — Z803 Family history of malignant neoplasm of breast: Secondary | ICD-10-CM

## 2016-06-13 DIAGNOSIS — Z800 Family history of malignant neoplasm of digestive organs: Secondary | ICD-10-CM

## 2016-06-13 DIAGNOSIS — E785 Hyperlipidemia, unspecified: Secondary | ICD-10-CM

## 2016-06-13 DIAGNOSIS — I4891 Unspecified atrial fibrillation: Secondary | ICD-10-CM

## 2016-06-13 DIAGNOSIS — R531 Weakness: Secondary | ICD-10-CM

## 2016-06-13 DIAGNOSIS — Z79899 Other long term (current) drug therapy: Secondary | ICD-10-CM

## 2016-06-13 DIAGNOSIS — Z5111 Encounter for antineoplastic chemotherapy: Secondary | ICD-10-CM | POA: Diagnosis not present

## 2016-06-13 LAB — COMPREHENSIVE METABOLIC PANEL
ALBUMIN: 3.6 g/dL (ref 3.5–5.0)
ALT: 15 U/L (ref 14–54)
AST: 23 U/L (ref 15–41)
Alkaline Phosphatase: 73 U/L (ref 38–126)
Anion gap: 7 (ref 5–15)
BUN: 14 mg/dL (ref 6–20)
CHLORIDE: 107 mmol/L (ref 101–111)
CO2: 26 mmol/L (ref 22–32)
CREATININE: 0.89 mg/dL (ref 0.44–1.00)
Calcium: 9.2 mg/dL (ref 8.9–10.3)
GFR calc non Af Amer: >60 mL/min (ref >60)
GLUCOSE: 110 mg/dL — AB (ref 65–99)
Potassium: 3.9 mmol/L (ref 3.5–5.1)
SODIUM: 140 mmol/L (ref 135–145)
Total Bilirubin: 0.6 mg/dL (ref 0.3–1.2)
Total Protein: 6.7 g/dL (ref 6.5–8.1)

## 2016-06-13 LAB — CBC WITH DIFFERENTIAL/PLATELET
BASOS ABS: 0 10*3/uL (ref 0–0.1)
Basophils Relative: 1 %
EOS PCT: 2 %
Eosinophils Absolute: 0.1 10*3/uL (ref 0–0.7)
HEMATOCRIT: 36.8 % (ref 35.0–47.0)
Hemoglobin: 12.9 g/dL (ref 12.0–16.0)
LYMPHS PCT: 39 %
Lymphs Abs: 1.6 10*3/uL (ref 1.0–3.6)
MCH: 36 pg — ABNORMAL HIGH (ref 26.0–34.0)
MCHC: 35.2 g/dL (ref 32.0–36.0)
MCV: 102.3 fL — AB (ref 80.0–100.0)
Monocytes Absolute: 0.5 10*3/uL (ref 0.2–0.9)
Monocytes Relative: 13 %
NEUTROS ABS: 1.9 10*3/uL (ref 1.4–6.5)
Neutrophils Relative %: 45 %
PLATELETS: 208 10*3/uL (ref 150–440)
RBC: 3.6 MIL/uL — AB (ref 3.80–5.20)
RDW: 16.6 % — ABNORMAL HIGH (ref 11.5–14.5)
WBC: 4.1 10*3/uL (ref 3.6–11.0)

## 2016-06-13 MED ORDER — PEGFILGRASTIM 6 MG/0.6ML ~~LOC~~ PSKT
6.0000 mg | PREFILLED_SYRINGE | Freq: Once | SUBCUTANEOUS | Status: AC
  Administered 2016-06-13: 6 mg via SUBCUTANEOUS
  Filled 2016-06-13: qty 0.6

## 2016-06-13 MED ORDER — DEXAMETHASONE SODIUM PHOSPHATE 10 MG/ML IJ SOLN
10.0000 mg | Freq: Once | INTRAMUSCULAR | Status: AC
  Administered 2016-06-13: 10 mg via INTRAVENOUS
  Filled 2016-06-13: qty 1

## 2016-06-13 MED ORDER — PALONOSETRON HCL INJECTION 0.25 MG/5ML
0.2500 mg | Freq: Once | INTRAVENOUS | Status: AC
  Administered 2016-06-13: 0.25 mg via INTRAVENOUS
  Filled 2016-06-13: qty 5

## 2016-06-13 MED ORDER — DOXORUBICIN HCL LIPOSOMAL CHEMO INJECTION 2 MG/ML
30.0000 mg/m2 | Freq: Once | INTRAVENOUS | Status: AC
  Administered 2016-06-13: 58 mg via INTRAVENOUS
  Filled 2016-06-13: qty 25

## 2016-06-13 MED ORDER — DEXTROSE 5 % IV SOLN
Freq: Once | INTRAVENOUS | Status: AC
  Administered 2016-06-13: 12:00:00 via INTRAVENOUS
  Filled 2016-06-13: qty 1000

## 2016-06-13 MED ORDER — SODIUM CHLORIDE 0.9 % IV SOLN: 10.0000 mg | Freq: Once | INTRAVENOUS | Status: DC

## 2016-06-13 MED ORDER — HEPARIN SOD (PORK) LOCK FLUSH 100 UNIT/ML IV SOLN
INTRAVENOUS | Status: AC
  Filled 2016-06-13: qty 5

## 2016-06-13 MED ORDER — SODIUM CHLORIDE 0.9 % IV SOLN
455.5000 mg | Freq: Once | INTRAVENOUS | Status: AC
  Administered 2016-06-13: 460 mg via INTRAVENOUS
  Filled 2016-06-13: qty 46

## 2016-06-13 NOTE — Progress Notes (Signed)
Doxil held last time due to redness and and blisters on hands and feet.

## 2016-06-14 LAB — CA 125: CA 125: 44.8 U/mL — ABNORMAL HIGH (ref 0.0–38.1)

## 2016-06-18 ENCOUNTER — Other Ambulatory Visit: Payer: Self-pay | Admitting: Internal Medicine

## 2016-06-18 DIAGNOSIS — C569 Malignant neoplasm of unspecified ovary: Secondary | ICD-10-CM

## 2016-06-18 DIAGNOSIS — K5900 Constipation, unspecified: Secondary | ICD-10-CM

## 2016-06-27 ENCOUNTER — Other Ambulatory Visit: Payer: Self-pay | Admitting: *Deleted

## 2016-06-27 ENCOUNTER — Inpatient Hospital Stay: Payer: PPO | Attending: Internal Medicine

## 2016-06-27 DIAGNOSIS — I10 Essential (primary) hypertension: Secondary | ICD-10-CM | POA: Diagnosis not present

## 2016-06-27 DIAGNOSIS — Z853 Personal history of malignant neoplasm of breast: Secondary | ICD-10-CM | POA: Insufficient documentation

## 2016-06-27 DIAGNOSIS — C569 Malignant neoplasm of unspecified ovary: Secondary | ICD-10-CM | POA: Insufficient documentation

## 2016-06-27 DIAGNOSIS — I480 Paroxysmal atrial fibrillation: Secondary | ICD-10-CM | POA: Diagnosis not present

## 2016-06-27 DIAGNOSIS — J309 Allergic rhinitis, unspecified: Secondary | ICD-10-CM | POA: Diagnosis not present

## 2016-06-27 DIAGNOSIS — K648 Other hemorrhoids: Secondary | ICD-10-CM | POA: Insufficient documentation

## 2016-06-27 DIAGNOSIS — I493 Ventricular premature depolarization: Secondary | ICD-10-CM | POA: Insufficient documentation

## 2016-06-27 DIAGNOSIS — E039 Hypothyroidism, unspecified: Secondary | ICD-10-CM | POA: Insufficient documentation

## 2016-06-27 DIAGNOSIS — M1990 Unspecified osteoarthritis, unspecified site: Secondary | ICD-10-CM | POA: Diagnosis not present

## 2016-06-27 DIAGNOSIS — G4700 Insomnia, unspecified: Secondary | ICD-10-CM | POA: Insufficient documentation

## 2016-06-27 DIAGNOSIS — Z90710 Acquired absence of both cervix and uterus: Secondary | ICD-10-CM | POA: Insufficient documentation

## 2016-06-27 DIAGNOSIS — R05 Cough: Secondary | ICD-10-CM | POA: Diagnosis not present

## 2016-06-27 DIAGNOSIS — M8580 Other specified disorders of bone density and structure, unspecified site: Secondary | ICD-10-CM | POA: Insufficient documentation

## 2016-06-27 DIAGNOSIS — F419 Anxiety disorder, unspecified: Secondary | ICD-10-CM | POA: Diagnosis not present

## 2016-06-27 DIAGNOSIS — J029 Acute pharyngitis, unspecified: Secondary | ICD-10-CM | POA: Diagnosis not present

## 2016-06-27 DIAGNOSIS — E7801 Familial hypercholesterolemia: Secondary | ICD-10-CM | POA: Insufficient documentation

## 2016-06-27 DIAGNOSIS — Z923 Personal history of irradiation: Secondary | ICD-10-CM | POA: Diagnosis not present

## 2016-06-27 DIAGNOSIS — K279 Peptic ulcer, site unspecified, unspecified as acute or chronic, without hemorrhage or perforation: Secondary | ICD-10-CM | POA: Diagnosis not present

## 2016-06-27 DIAGNOSIS — Z90722 Acquired absence of ovaries, bilateral: Secondary | ICD-10-CM | POA: Diagnosis not present

## 2016-06-27 DIAGNOSIS — Z9221 Personal history of antineoplastic chemotherapy: Secondary | ICD-10-CM | POA: Insufficient documentation

## 2016-06-27 DIAGNOSIS — R971 Elevated cancer antigen 125 [CA 125]: Secondary | ICD-10-CM | POA: Diagnosis not present

## 2016-06-27 DIAGNOSIS — I340 Nonrheumatic mitral (valve) insufficiency: Secondary | ICD-10-CM | POA: Diagnosis not present

## 2016-06-27 DIAGNOSIS — K5900 Constipation, unspecified: Secondary | ICD-10-CM | POA: Diagnosis not present

## 2016-06-27 DIAGNOSIS — K219 Gastro-esophageal reflux disease without esophagitis: Secondary | ICD-10-CM | POA: Insufficient documentation

## 2016-06-27 DIAGNOSIS — M5137 Other intervertebral disc degeneration, lumbosacral region: Secondary | ICD-10-CM | POA: Diagnosis not present

## 2016-06-27 DIAGNOSIS — Z79899 Other long term (current) drug therapy: Secondary | ICD-10-CM | POA: Insufficient documentation

## 2016-06-27 LAB — BASIC METABOLIC PANEL
ANION GAP: 6 (ref 5–15)
BUN: 14 mg/dL (ref 6–20)
CALCIUM: 9.1 mg/dL (ref 8.9–10.3)
CO2: 25 mmol/L (ref 22–32)
Chloride: 106 mmol/L (ref 101–111)
Creatinine, Ser: 0.78 mg/dL (ref 0.44–1.00)
Glucose, Bld: 112 mg/dL — ABNORMAL HIGH (ref 65–99)
POTASSIUM: 3.7 mmol/L (ref 3.5–5.1)
Sodium: 137 mmol/L (ref 135–145)

## 2016-06-27 LAB — CBC WITH DIFFERENTIAL/PLATELET
BASOS ABS: 0 10*3/uL (ref 0–0.1)
BASOS PCT: 1 %
EOS PCT: 1 %
Eosinophils Absolute: 0 10*3/uL (ref 0–0.7)
HCT: 33.3 % — ABNORMAL LOW (ref 35.0–47.0)
Hemoglobin: 11.4 g/dL — ABNORMAL LOW (ref 12.0–16.0)
LYMPHS PCT: 60 %
Lymphs Abs: 1.8 10*3/uL (ref 1.0–3.6)
MCH: 35.5 pg — ABNORMAL HIGH (ref 26.0–34.0)
MCHC: 34.2 g/dL (ref 32.0–36.0)
MCV: 103.7 fL — AB (ref 80.0–100.0)
MONO ABS: 0.2 10*3/uL (ref 0.2–0.9)
Monocytes Relative: 7 %
NEUTROS ABS: 0.9 10*3/uL — AB (ref 1.4–6.5)
Neutrophils Relative %: 31 %
PLATELETS: 129 10*3/uL — AB (ref 150–440)
RBC: 3.21 MIL/uL — AB (ref 3.80–5.20)
RDW: 14.9 % — AB (ref 11.5–14.5)
WBC: 3 10*3/uL — AB (ref 3.6–11.0)

## 2016-06-28 LAB — CA 125: CA 125: 50.4 U/mL — AB (ref 0.0–38.1)

## 2016-07-04 ENCOUNTER — Encounter: Payer: Self-pay | Admitting: Obstetrics and Gynecology

## 2016-07-04 ENCOUNTER — Inpatient Hospital Stay (HOSPITAL_BASED_OUTPATIENT_CLINIC_OR_DEPARTMENT_OTHER): Payer: PPO | Admitting: Obstetrics and Gynecology

## 2016-07-04 VITALS — BP 131/77 | HR 82 | Temp 97.5°F | Ht 63.5 in | Wt 184.2 lb

## 2016-07-04 DIAGNOSIS — E7801 Familial hypercholesterolemia: Secondary | ICD-10-CM

## 2016-07-04 DIAGNOSIS — R971 Elevated cancer antigen 125 [CA 125]: Secondary | ICD-10-CM

## 2016-07-04 DIAGNOSIS — M8580 Other specified disorders of bone density and structure, unspecified site: Secondary | ICD-10-CM

## 2016-07-04 DIAGNOSIS — I340 Nonrheumatic mitral (valve) insufficiency: Secondary | ICD-10-CM

## 2016-07-04 DIAGNOSIS — K279 Peptic ulcer, site unspecified, unspecified as acute or chronic, without hemorrhage or perforation: Secondary | ICD-10-CM

## 2016-07-04 DIAGNOSIS — Z9221 Personal history of antineoplastic chemotherapy: Secondary | ICD-10-CM

## 2016-07-04 DIAGNOSIS — Z90722 Acquired absence of ovaries, bilateral: Secondary | ICD-10-CM

## 2016-07-04 DIAGNOSIS — C569 Malignant neoplasm of unspecified ovary: Secondary | ICD-10-CM | POA: Diagnosis not present

## 2016-07-04 DIAGNOSIS — J309 Allergic rhinitis, unspecified: Secondary | ICD-10-CM

## 2016-07-04 DIAGNOSIS — Z853 Personal history of malignant neoplasm of breast: Secondary | ICD-10-CM

## 2016-07-04 DIAGNOSIS — I10 Essential (primary) hypertension: Secondary | ICD-10-CM

## 2016-07-04 DIAGNOSIS — Z923 Personal history of irradiation: Secondary | ICD-10-CM

## 2016-07-04 DIAGNOSIS — E039 Hypothyroidism, unspecified: Secondary | ICD-10-CM

## 2016-07-04 DIAGNOSIS — K219 Gastro-esophageal reflux disease without esophagitis: Secondary | ICD-10-CM

## 2016-07-04 DIAGNOSIS — M5137 Other intervertebral disc degeneration, lumbosacral region: Secondary | ICD-10-CM

## 2016-07-04 DIAGNOSIS — Z90710 Acquired absence of both cervix and uterus: Secondary | ICD-10-CM | POA: Diagnosis not present

## 2016-07-04 DIAGNOSIS — F419 Anxiety disorder, unspecified: Secondary | ICD-10-CM

## 2016-07-04 DIAGNOSIS — M1990 Unspecified osteoarthritis, unspecified site: Secondary | ICD-10-CM

## 2016-07-04 DIAGNOSIS — G4700 Insomnia, unspecified: Secondary | ICD-10-CM

## 2016-07-04 DIAGNOSIS — I493 Ventricular premature depolarization: Secondary | ICD-10-CM

## 2016-07-04 DIAGNOSIS — K648 Other hemorrhoids: Secondary | ICD-10-CM

## 2016-07-04 DIAGNOSIS — Z79899 Other long term (current) drug therapy: Secondary | ICD-10-CM

## 2016-07-04 DIAGNOSIS — I480 Paroxysmal atrial fibrillation: Secondary | ICD-10-CM

## 2016-07-04 NOTE — Patient Instructions (Signed)
Genetic testing: ATM mutation [id number].  I spoke with Dr. Lisbeth Ply at [facility] and he recommended genetic counseling for the patient and possibly other family members. He provided a phone number for them to call to schedule that appointment. The number is [phone].

## 2016-07-04 NOTE — Progress Notes (Signed)
Patient here for folllow up. Very concerned about CA 125 level.

## 2016-07-04 NOTE — Progress Notes (Signed)
  Oncology Nurse Navigator Documentation Chaperoned pelvic exam Navigator Location: [facility] (07/04/16 1000)   )Navigator Encounter Type: Clinic/[facility];Follow-up Appt (07/04/16 1000)                                                    Time Spent with Patient: 15 (07/04/16 1000)

## 2016-07-04 NOTE — Progress Notes (Signed)
Gynecologic Oncology Interval Visit   Referring Provider: Blain Pais, MD.  Chief Concern: Platinum-sensitive recurrent stage IIIc ovarian cancer  Subjective:  Jamie Conner is a 75 y.o. female who is seen for newly diagnosed platinum-sensitive recurrent stage IIIc ovarian cancer.   Since her last visit she started on chemotherapy with PLD and carboplatin with Dr. Grayland Ormond. She was last seen by Dr. Grayland Ormond on 06/13/2016. She has completed 5 cycles of therapy. She has an appointment with Dr. Rogue Bussing on 07/11/2016.  She does have a cold that started last week.   05/11/2016 CT scan abdomen and pelvis IMPRESSION: 1. No acute findings identified within the abdomen or pelvis. 2. Peritoneal nodules within the right side of pelvis are stable the slightly increased in size from previous exam. No new areas of disease identified.  Based on RECIST criteria she has stable disease.   CA125 02/20/2016 47.5 03/21/2016 31.1 05/16/2016 37.5 06/13/2016 44.8 06/27/2016 50.4  Gynecologic Oncology History Jamie Conner is a pleasant female who is seen for postoperative visit for stage IIIc high-grade serous ovarian cancer. See prior notes for complete detail.  She also has a history of  carcinoma of breast ( left) status post lumpectomy , radiation therapy and 5 years of tamoxifen. She underwent exploratory Laparotomy, bilateral salpingo-oophorectomy, right ureterolysis, infragastric omentectomy, and optimally debulked to no gross residual  May 4th, 2016. Preop CA125 2354.0.  She started chemotherapy with carboplatinum and Taxol in dose dense fashion from Jan 02, 2015. She underwent 6 cycles of chemotherapy completed 05/09/2015. Her dose of Taxol was reduced because of myelosuppression and fever in spite of Neulasta.  [place] CA125 = 9.8  06/06/2015 CT scan abdomen and pelvis IMPRESSION: 1. Interval removal of the large right ovarian mass . Dramatic improvement in the appearance of mesenteric  and omental implants. There is a 3 mm potential faint omental nodule at about the level of the umbilicus but for the most part the numerous prior omental and mesenteric tumor implants have resolved completely. 2. 1.8 by 1.2 cm fluid density structure along the splenic hilum, no change from prior, likely a small benign cystic lesion.  3. Other imaging findings of potential clinical significance: 3 cm periampullary duodenal diverticulum.   01/04/16 CA125 34.1  01/10/2016 CT scan chest abdomen and pelvis. 1. New nodular density 12 x 7 mm ties are seen throughout the pelvis. The largest measures 12 x 10 mm best seen on image number 105 of series. A 9 mm nodule is seen in the left pelvis best seen on image number 60 of series 5. These are concerning for possible peritoneal implants. 2. Stable 1.9 cm cystic lesion seen in splenic hilum.  Vaginal biopsy performed on 01/04/2016 negative    Genetic testing: ATM mutation [id number].  I spoke with Dr. Lisbeth Ply at [facility] and he recommended genetic counseling for the patient and possibly other family members. He provided a phone number for them to call to schedule that appointment. The number is [phone].  Problem List: Patient Active Problem List   Diagnosis Date Noted  . Encounter for monitoring cardiotoxic drug therapy 02/02/2016  . Breast cancer in female [facility]) 12/13/2015  . Peptic ulcer disease 12/12/2015  . DDD (degenerative disc disease), lumbosacral 12/12/2015  . Hyperlipidemia 12/12/2015  . Acute anxiety 12/12/2015  . Insomnia 12/12/2015  . Allergic rhinitis 12/12/2015  . GERD (gastroesophageal reflux disease) 12/12/2015  . Internal hemorrhoid 12/12/2015  . OA (osteoarthritis) 12/12/2015  . Osteopenia 12/12/2015  . Hypothyroid 04/11/2015  .  Benign essential HTN 02/01/2015  . Retroperitoneal fibrosis   . Combined fat and carbohydrate induced hyperlipemia 01/04/2015  . Awareness of heartbeats 01/04/2015  . Beat, premature  ventricular 01/04/2015  . Malignant neoplasm of ovary ([place]) 12/16/2014  . MI (mitral incompetence) 09/02/2014  . AF (paroxysmal atrial fibrillation) ([place]) 09/02/2014    Past Medical History: Past Medical History:  Diagnosis Date  . Atrial fibrillation ([place])   . Cancer of breast ([place]) 1992   Left  . GERD (gastroesophageal reflux disease)   . Hyperlipidemia   . Hypothyroidism   . Mitral insufficiency   . Ovarian cancer [facility])    s/p BSO optimal tumor debulking May 2016  . PVC (premature ventricular contraction)     Past Surgical History: Past Surgical History:  Procedure Laterality Date  . ABDOMINAL HYSTERECTOMY    . BILATERAL SALPINGOOPHORECTOMY  May 2016   with optimal tumor debulking   . BREAST SURGERY     Left  . CESAREAN SECTION     x2  . CHOLECYSTECTOMY    . COLONOSCOPY  2006  . DEBULKING N/A 12/22/2014   Procedure: DEBULKING;  Surgeon: Gillis Ends, MD;  Location: [facility] ORS;  Service: Gynecology;  Laterality: N/A;  . LAPAROTOMY N/A 12/22/2014   Procedure: EXPLORATORY LAPAROTOMY;  Surgeon: Robert Bellow, MD;  Location: [facility] ORS;  Service: General;  Laterality: N/A;  . LAPAROTOMY WITH STAGING N/A 12/22/2014   Procedure: LAPAROTOMY WITH STAGING;  Surgeon: Gillis Ends, MD;  Location: [facility] ORS;  Service: Gynecology;  Laterality: N/A;  . PERIPHERAL VASCULAR CATHETERIZATION Left 12/22/2014   Procedure: PORTA CATH INSERTION;  Surgeon: Robert Bellow, MD;  Location: [facility] ORS;  Service: General;  Laterality: Left;  . PORTACATH PLACEMENT Right 12/22/2014   Procedure: INSERTION PORT-A-CATH;  Surgeon: Robert Bellow, MD;  Location: [facility] ORS;  Service: General;  Laterality: Right;  . SALPINGOOPHORECTOMY Bilateral 12/22/2014   Procedure: SALPINGO OOPHORECTOMY;  Surgeon: Gillis Ends, MD;  Location: [facility] ORS;  Service: Gynecology;  Laterality: Bilateral;  . UPPER GI ENDOSCOPY  09/25/04   hiatus hernia, schatzki ring and a single gastric polyp    Past  Gynecologic History:  See HPI  OB History:  OB History  Gravida Para Term Preterm AB Living  2         2  SAB TAB Ectopic Multiple Live Births               # Outcome Date GA Lbr Len/2nd Weight Sex Delivery Anes PTL Lv  2 Gravida           1 Gravida             Obstetric Comments  1st Menstrual Cycle:  14  1st Pregnancy:  21    Family History: Family History  Problem Relation Age of Onset  . Cancer Mother     breast, throat, and stomach  . Breast cancer Mother   . Heart disease Father   . Pulmonary embolism Sister   . Cancer Brother 60    angiosarcoma of the chest; carcinoid small intestinal tumor  . Hypertension Brother   . Pulmonary embolism Other   . Cancer Other   . Cancer Maternal Aunt     breast cancer  . Cancer Maternal Grandfather 37    pancreatic    Social History: Social History   Social History  . Marital status: Married    Spouse name: N/A  . Number of children: N/A  . Years of education:  N/A   Occupational History  . Not on file.   Social History Main Topics  . Smoking status: Never Smoker  . Smokeless tobacco: Never Used  . Alcohol use No  . Drug use: No  . Sexual activity: Not Currently   Other Topics Concern  . Not on file   Social History Narrative  . No narrative on file    Allergies: Allergies  Allergen Reactions  . Carafate [Sucralfate] Rash  . Sulfa Antibiotics Rash    Current Medications: Current Outpatient Prescriptions  Medication Sig Dispense Refill  . acetaminophen (TYLENOL) 500 MG chewable tablet Chew 500 mg by mouth every 6 (six) hours as needed for pain.    . chlorhexidine (PERIDEX) 0.12 % solution     . Cholecalciferol (VITAMIN D) 2000 units tablet Take 1 tablet (2,000 Units total) by mouth daily. 30 tablet 12  . lactulose (CHRONULAC) 10 GM/15ML solution TAKE 15MLS (10G TOTAL) BY MOUTH THREE TIMES A DAY AS DIRECTED BY PHYSICIAN. 240 mL 1  . levothyroxine (SYNTHROID, LEVOTHROID) 50 MCG tablet Take 1 tablet (50  mcg total) by mouth daily. 30 tablet 12  . lidocaine-prilocaine (EMLA) cream APPLY TO AFFECTED AREA IF NEEDED 30 g 1  . LORazepam (ATIVAN) 0.5 MG tablet Take 1 tablet (0.5 mg total) by mouth at bedtime. 30 tablet 3  . mometasone (ELOCON) 0.1 % cream Apply 1 application topically daily. Apply to affected area no more than 2 weeks a month. 45 g 0  . Multiple Vitamin (MULTIVITAMIN) capsule Take 1 capsule by mouth daily.    . ondansetron (ZOFRAN) 4 MG tablet Take 4 mg by mouth every 6 (six) hours as needed for nausea or vomiting. Reported on 02/20/2016    . pantoprazole (PROTONIX) 40 MG tablet Take 1 tablet (40 mg total) by mouth daily. 30 tablet 12  . polyethylene glycol (MIRALAX / GLYCOLAX) packet Take 17 g by mouth daily.    . propafenone (RYTHMOL) 225 MG tablet Take 225 mg by mouth 2 (two) times daily.    . psyllium (METAMUCIL) 58.6 % powder Take 1 packet by mouth daily.    Marland Kitchen senna (SENOKOT) 8.6 MG TABS tablet Take 2 tablets by mouth daily.    Marland Kitchen triamcinolone ointment (KENALOG) 0.5 % Apply 1 application topically 2 (two) times daily. 30 g 0   No current facility-administered medications for this visit.    Facility-Administered Medications Ordered in Other Visits  Medication Dose Route Frequency Provider Last Rate Last Dose  . sodium chloride 0.9 % injection 10 mL  10 mL Intracatheter PRN Jamie Gleason, MD   10 mL at 02/07/15 0930    Review of Systems General: fatigue  HEENT: no complaints  Lungs: cough/URI  Cardiac: no complaints  GI: constipation  GU: no complaints  Musculoskeletal: no complaints  Extremities: no complaints  Skin: no complaints  Neuro: peripheral neuropathy/depression  Endocrine: no complaints  Psych: no complaints       Objective:  Physical Examination:  BP 131/77 (BP Location: Left Arm, Patient Position: Sitting)   Pulse 82   Temp 97.5 F (36.4 C) (Tympanic)   Ht 5' 3.5" (1.613 m) Comment: stated ht.  Wt 184 lb 2.9 oz (83.5 kg)   BMI 32.11 kg/m . Weight  is stable.    ECOG Performance Status: 0 - Asymptomatic  Physical Exam  Constitutional: She appears well-developed and well-nourished.  HENT:  Head: Normocephalic and atraumatic.  Eyes: Conjunctivae are normal. Pupils are equal, round, and reactive to light.  Cardiovascular:  Normal rate and regular rhythm.   Pulmonary/Chest: Breath sounds normal.  Abdominal: Soft. Bowel sounds are normal. She exhibits no distension and no mass. No hernia.  Musculoskeletal: She exhibits no edema.  Lymphadenopathy:    She has no cervical adenopathy.  Neurological: She is alert. No cranial nerve deficit.  Skin: Skin is warm and dry.  Psychiatric: Her behavior is normal.   Lymphadenopathy:    No supraclavicular or inguinal adenopathy.   Pelvic exam; Chaperoned by nurse: Pelvic: Vulva: normal appearing vulva with no masses, tenderness or lesions; Vagina: normal (prior 8 mm lesion on the upper vaginal lateral wall was not appreciated today); there also appears to be a left paravaginal cyst approximately mid portion of the vagina, on BME the left paravaginal cyst was stable; BME: negative for masses or nodularity; Uterus and Cervix: surgically absent; Rectal: confirms. Possibly a deep 1.5 cm nodule, but this is not definitive.   Lab Review Lab Results  Component Value Date   CA125 50.4 (H) 06/27/2016   CA125 44.8 (H) 06/13/2016   CA125 37.5 05/16/2016    Radiologic Imaging: none    Assessment:  Jamie Conner is a 75 y.o. female diagnosed with optimally debulked stage IIIc  high-grade serous ovarian cancer s/p chemotherapy with complete response. Platinum-sensitive recurrent ovarian cancer diagnosed 01/10/2016.  ATM mutation. Extensive family history of cancer (breast, pancreatic, throat, gastric, adenosarcoma of the chest, carcinoid tumor of the small bowel) in several first degree relatives.  Vaginal lesion, granulation tissue.  Recurrent platinum-sensitive ovarian cancer with stable disease on  PLD/carboplatin based on RECIST criteria. Rise in CA125 may be due to URI versus progressive disease.  Plan:   Problem List Items Addressed This Visit      Genitourinary   Malignant neoplasm of ovary ([place]) - Primary (Chronic)      I recommend proceeding with PLD/carboplatin for the next cycle since there is no definitive evidence of measurable disease progression. Repeat imaging after cycle 6. If she has stable or progressive disease consider PARPi. If she has progression she could receive rucaparib under the FDA approved indication. We will continue to follow closely with repeat exam in 3 months.   I encouraged her to have her family undergo genetic counseling for ATM mutation carrier assessment.   Gillis Ends, MD    CC:  Blain Pais, MD.

## 2016-07-05 ENCOUNTER — Ambulatory Visit
Admission: RE | Admit: 2016-07-05 | Discharge: 2016-07-05 | Disposition: A | Discharge status: Home / Self Care | Payer: PPO | Source: Ambulatory Visit | Attending: Internal Medicine | Admitting: Internal Medicine

## 2016-07-05 ENCOUNTER — Telehealth: Payer: Self-pay | Admitting: *Deleted

## 2016-07-05 DIAGNOSIS — R05 Cough: Secondary | ICD-10-CM | POA: Diagnosis not present

## 2016-07-05 DIAGNOSIS — Z5189 Encounter for other specified aftercare: Secondary | ICD-10-CM

## 2016-07-05 DIAGNOSIS — C569 Malignant neoplasm of unspecified ovary: Secondary | ICD-10-CM | POA: Diagnosis not present

## 2016-07-05 DIAGNOSIS — R059 Cough, unspecified: Secondary | ICD-10-CM

## 2016-07-05 MED ORDER — DEXTROMETHORPHAN-GUAIFENESIN 10-100 MG/5ML PO SYRP: 5.0000 mL | ORAL_SOLUTION | Freq: Two times a day (BID) | ORAL | 0 refills | Status: DC

## 2016-07-05 MED ORDER — BENZONATATE 100 MG PO CAPS: 100.0000 mg | ORAL_CAPSULE | Freq: Two times a day (BID) | ORAL | 0 refills | Status: DC

## 2016-07-05 NOTE — Telephone Encounter (Signed)
Patient informed of CXR results and med being sent to pharmacy

## 2016-07-05 NOTE — Telephone Encounter (Signed)
Per VO Dr Rogue Bussing, patient to get CXR today and will determine treatment Patient agrees to go after she eats lunch

## 2016-07-05 NOTE — Telephone Encounter (Signed)
Called to report that she has had a sore throat (stinging and scratchy) for a week now and has now also developed a "chest cough", Reports nasal congestion. Denies fever. Has been taking benadryl at night for her symptoms.Asking if she needs antibiotics. Please advise.

## 2016-07-05 NOTE — Telephone Encounter (Signed)
-----   Message from Cammie Sickle, MD sent at 07/05/2016  4:50 PM EST ----- Please inform patient that chest x-ray is negative for pneumonia; recommend Tessalon Perles; over-the-counter Robitussin. Thx

## 2016-07-06 ENCOUNTER — Ambulatory Visit
Admission: RE | Admit: 2016-07-06 | Discharge: 2016-07-06 | Disposition: A | Discharge status: Home / Self Care | Payer: PPO | Source: Ambulatory Visit | Attending: Internal Medicine | Admitting: Internal Medicine

## 2016-07-06 DIAGNOSIS — Z1231 Encounter for screening mammogram for malignant neoplasm of breast: Secondary | ICD-10-CM

## 2016-07-11 ENCOUNTER — Inpatient Hospital Stay: Payer: PPO

## 2016-07-11 ENCOUNTER — Inpatient Hospital Stay (HOSPITAL_BASED_OUTPATIENT_CLINIC_OR_DEPARTMENT_OTHER): Payer: PPO

## 2016-07-11 ENCOUNTER — Telehealth: Payer: Self-pay | Admitting: Pharmacist

## 2016-07-11 ENCOUNTER — Inpatient Hospital Stay (HOSPITAL_BASED_OUTPATIENT_CLINIC_OR_DEPARTMENT_OTHER): Payer: PPO | Admitting: Internal Medicine

## 2016-07-11 VITALS — BP 130/80 | HR 79 | Temp 96.6°F | Resp 18 | Wt 186.0 lb

## 2016-07-11 DIAGNOSIS — I10 Essential (primary) hypertension: Secondary | ICD-10-CM

## 2016-07-11 DIAGNOSIS — J029 Acute pharyngitis, unspecified: Secondary | ICD-10-CM | POA: Diagnosis not present

## 2016-07-11 DIAGNOSIS — E7801 Familial hypercholesterolemia: Secondary | ICD-10-CM

## 2016-07-11 DIAGNOSIS — M5137 Other intervertebral disc degeneration, lumbosacral region: Secondary | ICD-10-CM

## 2016-07-11 DIAGNOSIS — K5900 Constipation, unspecified: Secondary | ICD-10-CM

## 2016-07-11 DIAGNOSIS — F419 Anxiety disorder, unspecified: Secondary | ICD-10-CM

## 2016-07-11 DIAGNOSIS — Z90710 Acquired absence of both cervix and uterus: Secondary | ICD-10-CM

## 2016-07-11 DIAGNOSIS — K279 Peptic ulcer, site unspecified, unspecified as acute or chronic, without hemorrhage or perforation: Secondary | ICD-10-CM

## 2016-07-11 DIAGNOSIS — G4700 Insomnia, unspecified: Secondary | ICD-10-CM

## 2016-07-11 DIAGNOSIS — C569 Malignant neoplasm of unspecified ovary: Secondary | ICD-10-CM

## 2016-07-11 DIAGNOSIS — Z79899 Other long term (current) drug therapy: Secondary | ICD-10-CM

## 2016-07-11 DIAGNOSIS — I340 Nonrheumatic mitral (valve) insufficiency: Secondary | ICD-10-CM

## 2016-07-11 DIAGNOSIS — E039 Hypothyroidism, unspecified: Secondary | ICD-10-CM

## 2016-07-11 DIAGNOSIS — Z90722 Acquired absence of ovaries, bilateral: Secondary | ICD-10-CM

## 2016-07-11 DIAGNOSIS — J309 Allergic rhinitis, unspecified: Secondary | ICD-10-CM

## 2016-07-11 DIAGNOSIS — K648 Other hemorrhoids: Secondary | ICD-10-CM

## 2016-07-11 DIAGNOSIS — R971 Elevated cancer antigen 125 [CA 125]: Secondary | ICD-10-CM

## 2016-07-11 DIAGNOSIS — K219 Gastro-esophageal reflux disease without esophagitis: Secondary | ICD-10-CM

## 2016-07-11 DIAGNOSIS — M8580 Other specified disorders of bone density and structure, unspecified site: Secondary | ICD-10-CM

## 2016-07-11 DIAGNOSIS — I493 Ventricular premature depolarization: Secondary | ICD-10-CM

## 2016-07-11 DIAGNOSIS — R05 Cough: Secondary | ICD-10-CM | POA: Diagnosis not present

## 2016-07-11 DIAGNOSIS — Z853 Personal history of malignant neoplasm of breast: Secondary | ICD-10-CM

## 2016-07-11 DIAGNOSIS — M1990 Unspecified osteoarthritis, unspecified site: Secondary | ICD-10-CM

## 2016-07-11 DIAGNOSIS — Z9221 Personal history of antineoplastic chemotherapy: Secondary | ICD-10-CM

## 2016-07-11 DIAGNOSIS — Z923 Personal history of irradiation: Secondary | ICD-10-CM

## 2016-07-11 DIAGNOSIS — I480 Paroxysmal atrial fibrillation: Secondary | ICD-10-CM

## 2016-07-11 LAB — CBC WITH DIFFERENTIAL/PLATELET
Basophils Absolute: 0 10*3/uL (ref 0–0.1)
Basophils Relative: 1 %
Eosinophils Absolute: 0 10*3/uL (ref 0–0.7)
Eosinophils Relative: 0 %
HEMATOCRIT: 35.7 % (ref 35.0–47.0)
Hemoglobin: 12.3 g/dL (ref 12.0–16.0)
LYMPHS ABS: 2 10*3/uL (ref 1.0–3.6)
LYMPHS PCT: 48 %
MCH: 35.9 pg — AB (ref 26.0–34.0)
MCHC: 34.6 g/dL (ref 32.0–36.0)
MCV: 103.8 fL — AB (ref 80.0–100.0)
MONO ABS: 0.7 10*3/uL (ref 0.2–0.9)
MONOS PCT: 16 %
NEUTROS ABS: 1.5 10*3/uL (ref 1.4–6.5)
Neutrophils Relative %: 35 %
Platelets: 250 10*3/uL (ref 150–440)
RBC: 3.44 MIL/uL — ABNORMAL LOW (ref 3.80–5.20)
RDW: 15.2 % — AB (ref 11.5–14.5)
WBC: 4.2 10*3/uL (ref 3.6–11.0)

## 2016-07-11 LAB — COMPREHENSIVE METABOLIC PANEL
ALT: 13 U/L — ABNORMAL LOW (ref 14–54)
ANION GAP: 6 (ref 5–15)
AST: 21 U/L (ref 15–41)
Albumin: 3.6 g/dL (ref 3.5–5.0)
Alkaline Phosphatase: 71 U/L (ref 38–126)
BILIRUBIN TOTAL: 0.4 mg/dL (ref 0.3–1.2)
BUN: 18 mg/dL (ref 6–20)
CALCIUM: 9 mg/dL (ref 8.9–10.3)
CO2: 26 mmol/L (ref 22–32)
Chloride: 105 mmol/L (ref 101–111)
Creatinine, Ser: 0.88 mg/dL (ref 0.44–1.00)
GFR calc Af Amer: >60 mL/min (ref >60)
Glucose, Bld: 97 mg/dL (ref 65–99)
POTASSIUM: 3.9 mmol/L (ref 3.5–5.1)
Sodium: 137 mmol/L (ref 135–145)
Total Protein: 6.8 g/dL (ref 6.5–8.1)

## 2016-07-11 MED ORDER — SODIUM CHLORIDE 0.9 % IV SOLN
455.5000 mg | Freq: Once | INTRAVENOUS | Status: AC
  Administered 2016-07-11: 460 mg via INTRAVENOUS
  Filled 2016-07-11: qty 46

## 2016-07-11 MED ORDER — HEPARIN SOD (PORK) LOCK FLUSH 100 UNIT/ML IV SOLN
500.0000 [IU] | Freq: Once | INTRAVENOUS | Status: AC
  Administered 2016-07-11: 500 [IU] via INTRAVENOUS
  Filled 2016-07-11: qty 5

## 2016-07-11 MED ORDER — DEXTROSE 5 % IV SOLN
Freq: Once | INTRAVENOUS | Status: AC
  Administered 2016-07-11: 11:00:00 via INTRAVENOUS
  Filled 2016-07-11: qty 1000

## 2016-07-11 MED ORDER — PALONOSETRON HCL INJECTION 0.25 MG/5ML
0.2500 mg | Freq: Once | INTRAVENOUS | Status: AC
  Administered 2016-07-11: 0.25 mg via INTRAVENOUS
  Filled 2016-07-11: qty 5

## 2016-07-11 MED ORDER — DEXAMETHASONE SODIUM PHOSPHATE 10 MG/ML IJ SOLN
10.0000 mg | Freq: Once | INTRAMUSCULAR | Status: AC
  Administered 2016-07-11: 10 mg via INTRAVENOUS
  Filled 2016-07-11: qty 1

## 2016-07-11 MED ORDER — POLYETHYLENE GLYCOL 3350 17 G PO PACK: 17.0000 g | PACK | Freq: Every day | ORAL | 4 refills | Status: DC

## 2016-07-11 MED ORDER — DOXORUBICIN HCL LIPOSOMAL CHEMO INJECTION 2 MG/ML
30.0000 mg/m2 | Freq: Once | INTRAVENOUS | Status: AC
  Administered 2016-07-11: 58 mg via INTRAVENOUS
  Filled 2016-07-11: qty 25

## 2016-07-11 MED ORDER — SODIUM CHLORIDE 0.9 % IV SOLN: 10.0000 mg | Freq: Once | INTRAVENOUS | Status: DC

## 2016-07-11 MED ORDER — SODIUM CHLORIDE 0.9% FLUSH
10.0000 mL | Freq: Once | INTRAVENOUS | Status: AC
  Administered 2016-07-11: 10 mL via INTRAVENOUS
  Filled 2016-07-11: qty 10

## 2016-07-11 MED ORDER — PEGFILGRASTIM 6 MG/0.6ML ~~LOC~~ PSKT
6.0000 mg | PREFILLED_SYRINGE | Freq: Once | SUBCUTANEOUS | Status: AC
  Administered 2016-07-11: 6 mg via SUBCUTANEOUS
  Filled 2016-07-11: qty 0.6

## 2016-07-11 NOTE — Progress Notes (Signed)
Patient is here for follow up, she would like to ask about taking her Vitamin d. Her scratchy throat comes and goes and now has drainage

## 2016-07-11 NOTE — Assessment & Plan Note (Addendum)
Recurrent ovarian cancer- Platinum sensitive. Currently on carboplatin and Doxil every 4 weeks; slight increase in the CA-125.  # Proceed with cycle # 6 today; tolerating well.   # Hand foot- G-1 Vonzell Schlatter; Vaseline/ gloves/udder cream. kenalog cream;   # Sore throat/Cough- CXR- neg;  recommend claritin.   # Discussed with the patient and husband that the treatments are ongoing/indefinit. Also discussed the role of use of PARP inhibitors [Zejula or rupacarib] for maintenance/treatment of ovarian cancer. Patient is in [place]- mutated when she should have a good response/ long duration of response.  # constipation- recommend miralax.   # follow up in 2 weeks/ labs/ chemo again 4 weeks/labs/ will add Ca- 125 today. Will get CT scan in 5 weeks/ follow up in 6 weeks. We will discuss/initiate treatment with Parp inhibitor.

## 2016-07-11 NOTE — Telephone Encounter (Signed)
Patient will not get another [place] at this time. Physician says patient has almost completed therapy.

## 2016-07-11 NOTE — Progress Notes (Signed)
[place] OFFICE PROGRESS NOTE  Patient Care Team: Jerrol Banana., MD as PCP - General (Family Medicine) [facility] Gaetana Michaelis, MD as Referring Physician (Obstetrics and Gynecology) Robert Bellow, MD as Consulting Physician (General Surgery)  Ovarian cancer [facility])   Staging form: Ovary, AJCC 7th Edition     Clinical: Stage IIIC (T3c, N0, M0) - Unsigned    Oncology History   # April- MAY 2016- OVARIAN CANCER STAGE IIIC; CA-125 +2300+;  s/p OPTIMAL DEBULKING SURGERY   # MAY 2016[name] [finished in Sep 19th 2017]  # MAY CT 2017- RECURRENT/Peritoneal carcinomatosis/pelvic implant ~1.2cm/Ca-125-34;   # July 3rd- CARBO-DOXIL q 4W [with neulasta]; CT May 11 2016- slight increase peritoneal / stable nodules. Cont chemo.   # G-1-2 hand foot syndrome-   # LEFT BREAST CA s/p Lumpec & RT s/p TAM   # Afib [cardiology]; JUNE 2017- MUGA 62%  MOLECULAR TESTING- ATM genetic mutation      Ovary cancer, unspecified laterality ([place])   12/16/2014 Initial Diagnosis    Ovarian cancer       INTERVAL HISTORY:  75 year old female patient with above history of ovarian cancer recurrent Platinum sensitive- currently on Doxil and carboplatin status post cycle 5Is here for follow-up.  Notes to have significantly improvement of the hand-foot syndrome. She states keeping her hands cold-during chemotherapy helping.  On a MiraLAX.  Patient's appetite is good. Denies any abdominal pain. Denies abdominal distention. No chest pain or shortness of breath or cough.   REVIEW OF SYSTEMS:  A complete 10 point review of system is done which is negative except mentioned above/history of present illness.   PAST MEDICAL HISTORY :  Past Medical History:  Diagnosis Date  . Atrial fibrillation ([place])   . Cancer of breast ([place]) 1992   Left  . GERD (gastroesophageal reflux disease)   . Hyperlipidemia   . Hypothyroidism   . Mitral insufficiency   . Ovarian cancer [facility])     s/p BSO optimal tumor debulking May 2016  . PVC (premature ventricular contraction)     PAST SURGICAL HISTORY :   Past Surgical History:  Procedure Laterality Date  . ABDOMINAL HYSTERECTOMY    . BILATERAL SALPINGOOPHORECTOMY  May 2016   with optimal tumor debulking   . BREAST SURGERY     Left  . CESAREAN SECTION     x2  . CHOLECYSTECTOMY    . COLONOSCOPY  2006  . DEBULKING N/A 12/22/2014   Procedure: DEBULKING;  Surgeon: Gillis Ends, MD;  Location: [facility] ORS;  Service: Gynecology;  Laterality: N/A;  . LAPAROTOMY N/A 12/22/2014   Procedure: EXPLORATORY LAPAROTOMY;  Surgeon: Robert Bellow, MD;  Location: [facility] ORS;  Service: General;  Laterality: N/A;  . LAPAROTOMY WITH STAGING N/A 12/22/2014   Procedure: LAPAROTOMY WITH STAGING;  Surgeon: Gillis Ends, MD;  Location: [facility] ORS;  Service: Gynecology;  Laterality: N/A;  . PERIPHERAL VASCULAR CATHETERIZATION Left 12/22/2014   Procedure: PORTA CATH INSERTION;  Surgeon: Robert Bellow, MD;  Location: [facility] ORS;  Service: General;  Laterality: Left;  . PORTACATH PLACEMENT Right 12/22/2014   Procedure: INSERTION PORT-A-CATH;  Surgeon: Robert Bellow, MD;  Location: [facility] ORS;  Service: General;  Laterality: Right;  . SALPINGOOPHORECTOMY Bilateral 12/22/2014   Procedure: SALPINGO OOPHORECTOMY;  Surgeon: Gillis Ends, MD;  Location: [facility] ORS;  Service: Gynecology;  Laterality: Bilateral;  . UPPER GI ENDOSCOPY  09/25/04   hiatus hernia, schatzki ring and a single gastric polyp  FAMILY HISTORY :   Family History  Problem Relation Age of Onset  . Cancer Mother     breast, throat, and stomach  . Breast cancer Mother   . Heart disease Father   . Pulmonary embolism Sister   . Cancer Brother 60    angiosarcoma of the chest; carcinoid small intestinal tumor  . Hypertension Brother   . Pulmonary embolism Other   . Cancer Other   . Cancer Maternal Aunt     breast cancer  . Cancer Maternal Grandfather 48     pancreatic    SOCIAL HISTORY:   Social History  Substance Use Topics  . Smoking status: Never Smoker  . Smokeless tobacco: Never Used  . Alcohol use No    ALLERGIES:  is allergic to carafate [sucralfate] and sulfa antibiotics.  MEDICATIONS:  Current Outpatient Prescriptions  Medication Sig Dispense Refill  . acetaminophen (TYLENOL) 500 MG chewable tablet Chew 500 mg by mouth every 6 (six) hours as needed for pain.    . chlorhexidine (PERIDEX) 0.12 % solution     . levothyroxine (SYNTHROID, LEVOTHROID) 50 MCG tablet Take 1 tablet (50 mcg total) by mouth daily. 30 tablet 12  . lidocaine-prilocaine (EMLA) cream APPLY TO AFFECTED AREA IF NEEDED 30 g 1  . loratadine (CLARITIN) 10 MG tablet Take 10 mg by mouth daily.    Marland Kitchen LORazepam (ATIVAN) 0.5 MG tablet Take 1 tablet (0.5 mg total) by mouth at bedtime. 30 tablet 3  . Multiple Vitamin (MULTIVITAMIN) capsule Take 1 capsule by mouth daily.    . ondansetron (ZOFRAN) 4 MG tablet Take 4 mg by mouth every 6 (six) hours as needed for nausea or vomiting. Reported on 02/20/2016    . pantoprazole (PROTONIX) 40 MG tablet Take 1 tablet (40 mg total) by mouth daily. 30 tablet 12  . polyethylene glycol (MIRALAX / GLYCOLAX) packet Take 17 g by mouth daily. 30 each 4  . propafenone (RYTHMOL) 225 MG tablet Take 225 mg by mouth 2 (two) times daily.    . psyllium (METAMUCIL) 58.6 % powder Take 1 packet by mouth daily.    . Cholecalciferol (VITAMIN D) 2000 units tablet Take 1 tablet (2,000 Units total) by mouth daily. (Patient not taking: Reported on 07/11/2016) 30 tablet 12  . Dextromethorphan-Guaifenesin (ROBITUSSIN DM) 10-100 MG/5ML liquid Take 5 mLs by mouth every 12 (twelve) hours. (Patient not taking: Reported on 07/11/2016) 180 mL 0  . lactulose (CHRONULAC) 10 GM/15ML solution TAKE 15MLS (10G TOTAL) BY MOUTH THREE TIMES A DAY AS DIRECTED BY PHYSICIAN. (Patient not taking: Reported on 07/11/2016) 240 mL 1  . mometasone (ELOCON) 0.1 % cream Apply 1  application topically daily. Apply to affected area no more than 2 weeks a month. (Patient not taking: Reported on 07/11/2016) 45 g 0  . senna (SENOKOT) 8.6 MG TABS tablet Take 2 tablets by mouth daily.    Marland Kitchen triamcinolone ointment (KENALOG) 0.5 % Apply 1 application topically 2 (two) times daily. (Patient not taking: Reported on 07/11/2016) 30 g 0   No current facility-administered medications for this visit.    Facility-Administered Medications Ordered in Other Visits  Medication Dose Route Frequency Provider Last Rate Last Dose  . sodium chloride 0.9 % injection 10 mL  10 mL Intracatheter PRN Forest Gleason, MD   10 mL at 02/07/15 0930    PHYSICAL EXAMINATION: ECOG PERFORMANCE STATUS: 0 - Asymptomatic  BP 130/80 (BP Location: Right Arm, Patient Position: Sitting)   Pulse 79   Temp (!) 96.6  F (35.9 C) (Tympanic)   Resp 18   Wt 186 lb (84.4 kg)   SpO2 97%   BMI 32.43 kg/m   Filed Weights   07/11/16 0937  Weight: 186 lb (84.4 kg)    GENERAL: Well-nourished well-developed; Alert, no distress and comfortAccompanied by her husband. EYES: no pallor or icterus OROPHARYNX: no thrush or ulceration; good dentition  NECK: supple, no masses felt LYMPH:  no palpable lymphadenopathy in the cervical, axillary or inguinal regions LUNGS: clear to auscultation and  No wheeze or crackles HEART/CVS: regular rate & rhythm and no murmurs; No lower extremity edema ABDOMEN:abdomen soft, non-tender and normal bowel sounds Musculoskeletal:no cyanosis of digits and no clubbing  PSYCH: alert & oriented x 3 with fluent speech NEURO: no focal motor/sensory deficits SKIN:  Mild erythema noted bilateral upper extremities and feet. No desquamation.  LABORATORY DATA:  I have reviewed the data as listed    Component Value Date/Time   NA 137 07/11/2016 0902   NA 140 12/14/2014 1037   K 3.9 07/11/2016 0902   K 4.4 12/14/2014 1037   CL 105 07/11/2016 0902   CL 106 12/14/2014 1037   CO2 26 07/11/2016  0902   CO2 26 12/14/2014 1037   GLUCOSE 97 07/11/2016 0902   GLUCOSE 87 12/14/2014 1037   BUN 18 07/11/2016 0902   BUN 12 12/14/2014 1037   CREATININE 0.88 07/11/2016 0902   CREATININE 0.97 12/14/2014 1037   CALCIUM 9.0 07/11/2016 0902   CALCIUM 9.2 12/14/2014 1037   PROT 6.8 07/11/2016 0902   PROT 7.3 12/14/2014 1037   ALBUMIN 3.6 07/11/2016 0902   ALBUMIN 3.8 12/14/2014 1037   AST 21 07/11/2016 0902   AST 20 12/14/2014 1037   ALT 13 (L) 07/11/2016 0902   ALT 11 (L) 12/14/2014 1037   ALKPHOS 71 07/11/2016 0902   ALKPHOS 138 (H) 12/14/2014 1037   BILITOT 0.4 07/11/2016 0902   BILITOT 0.5 12/14/2014 1037   GFRNONAA >60 07/11/2016 0902   GFRNONAA 59 (L) 12/14/2014 1037   GFRAA >60 07/11/2016 0902   GFRAA >60 12/14/2014 1037    No results found for: SPEP, UPEP  Lab Results  Component Value Date   WBC 4.2 07/11/2016   NEUTROABS 1.5 07/11/2016   HGB 12.3 07/11/2016   HCT 35.7 07/11/2016   MCV 103.8 (H) 07/11/2016   PLT 250 07/11/2016      Chemistry      Component Value Date/Time   NA 137 07/11/2016 0902   NA 140 12/14/2014 1037   K 3.9 07/11/2016 0902   K 4.4 12/14/2014 1037   CL 105 07/11/2016 0902   CL 106 12/14/2014 1037   CO2 26 07/11/2016 0902   CO2 26 12/14/2014 1037   BUN 18 07/11/2016 0902   BUN 12 12/14/2014 1037   CREATININE 0.88 07/11/2016 0902   CREATININE 0.97 12/14/2014 1037   GLU 84 03/16/2014      Component Value Date/Time   CALCIUM 9.0 07/11/2016 0902   CALCIUM 9.2 12/14/2014 1037   ALKPHOS 71 07/11/2016 0902   ALKPHOS 138 (H) 12/14/2014 1037   AST 21 07/11/2016 0902   AST 20 12/14/2014 1037   ALT 13 (L) 07/11/2016 0902   ALT 11 (L) 12/14/2014 1037   BILITOT 0.4 07/11/2016 0902   BILITOT 0.5 12/14/2014 1037     abdomen or pelvis. Peritoneal deposits within the pelvis are again noted. Index implant within the right side of pelvis measures 1.5 cm, image 72 of series 2. Previously  1.2 cm. Adjacent nodule measures 0.9 cm, image 72 of  series 2. Previously 0.6 cm. Lastly, index peritoneal deposit measures 1.4 cm, image 71 of series 2. Unchanged from previous exam.  Musculoskeletal: No aggressive lytic or sclerotic bone lesions.  IMPRESSION: 1. No acute findings identified within the abdomen or pelvis. 2. Peritoneal nodules within the right side of pelvis are stable the slightly increased in size from previous exam. No new areas of disease identified.   Electronically Signed   By: Kerby Moors M.D.   On: 05/11/2016 10:57  RADIOGRAPHIC STUDIES: I have personally reviewed the radiological images as listed and agreed with the findings in the report. No results found.   ASSESSMENT & PLAN:  Ovary cancer, unspecified laterality ([place]) Recurrent ovarian cancer- Platinum sensitive. Currently on carboplatin and Doxil every 4 weeks; slight increase in the CA-125.  # Proceed with cycle # 6 today; tolerating well.   # Hand foot- G-1 Vonzell Schlatter; Vaseline/ gloves/udder cream. kenalog cream;   # Sore throat/Cough- CXR- neg;  recommend claritin.   # Discussed with the patient and husband that the treatments are ongoing/indefinit. Also discussed the role of use of PARP inhibitors [Zejula or rupacarib] for maintenance/treatment of ovarian cancer. Patient is in [place]- mutated when she should have a good response/ long duration of response.  # constipation- recommend miralax.   # follow up in 2 weeks/ labs/ chemo again 4 weeks/labs/ will add Ca- 125 today. Will get CT scan in 5 weeks/ follow up in 6 weeks. We will discuss/initiate treatment with Parp inhibitor.    Orders Placed This Encounter  Procedures  . CT CHEST W CONTRAST    Standing Status:   Future    Standing Expiration Date:   09/10/2017    Order Specific Question:   Reason for Exam (SYMPTOM  OR DIAGNOSIS REQUIRED)    Answer:   ovarian cancer    Order Specific Question:   Preferred imaging location?    Answer:   [facility]  . CT ABDOMEN PELVIS W CONTRAST     Standing Status:   Future    Standing Expiration Date:   10/10/2017    Order Specific Question:   Reason for Exam (SYMPTOM  OR DIAGNOSIS REQUIRED)    Answer:   ovarian cancer    Order Specific Question:   Preferred imaging location?    Answer:   [facility]  . CBC with Differential    Standing Status:   Future    Standing Expiration Date:   07/11/2017  . Basic metabolic panel    Standing Status:   Future    Standing Expiration Date:   07/11/2017  . Comprehensive metabolic panel    Standing Status:   Future    Standing Expiration Date:   07/11/2017  . CA 125    Standing Status:   Future    Standing Expiration Date:   07/11/2017  . CBC with Differential    Standing Status:   Future    Standing Expiration Date:   07/11/2017   All questions were answered. The patient knows to call the clinic with any problems, questions or concerns.      Cammie Sickle, MD 07/11/2016 6:28 PM

## 2016-07-14 IMAGING — MG MM DIGITAL SCREENING BILATERAL
4 series · 4 of 4 positions shown · non-contrast
Comparison: Previous exam(s).

CLINICAL DATA: Screening.

EXAM:
DIGITAL SCREENING BILATERAL MAMMOGRAM WITH CAD

[R MLO]
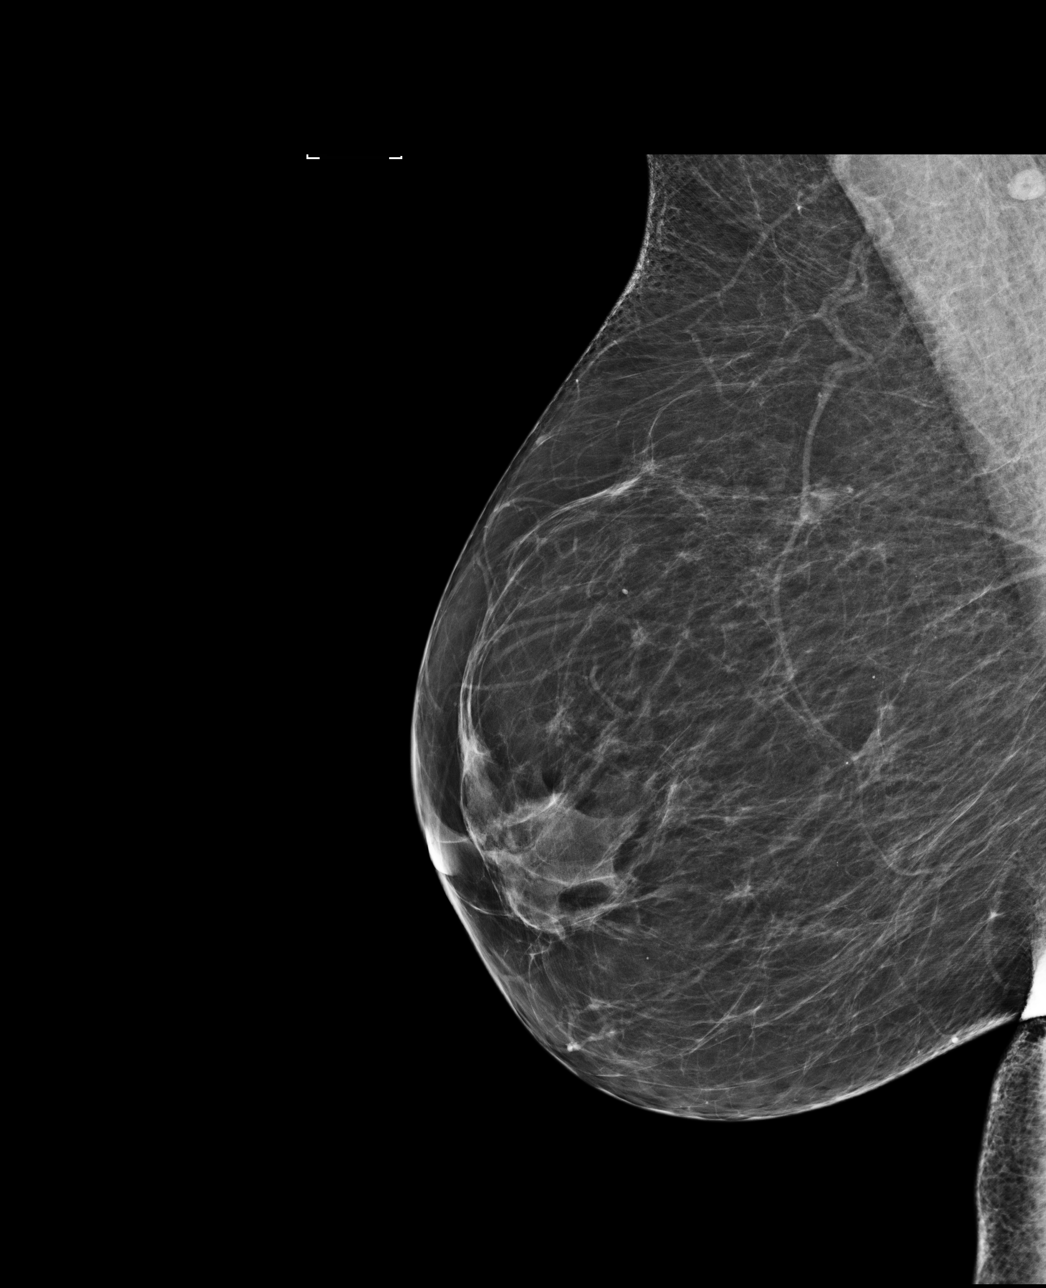

[L MLO]
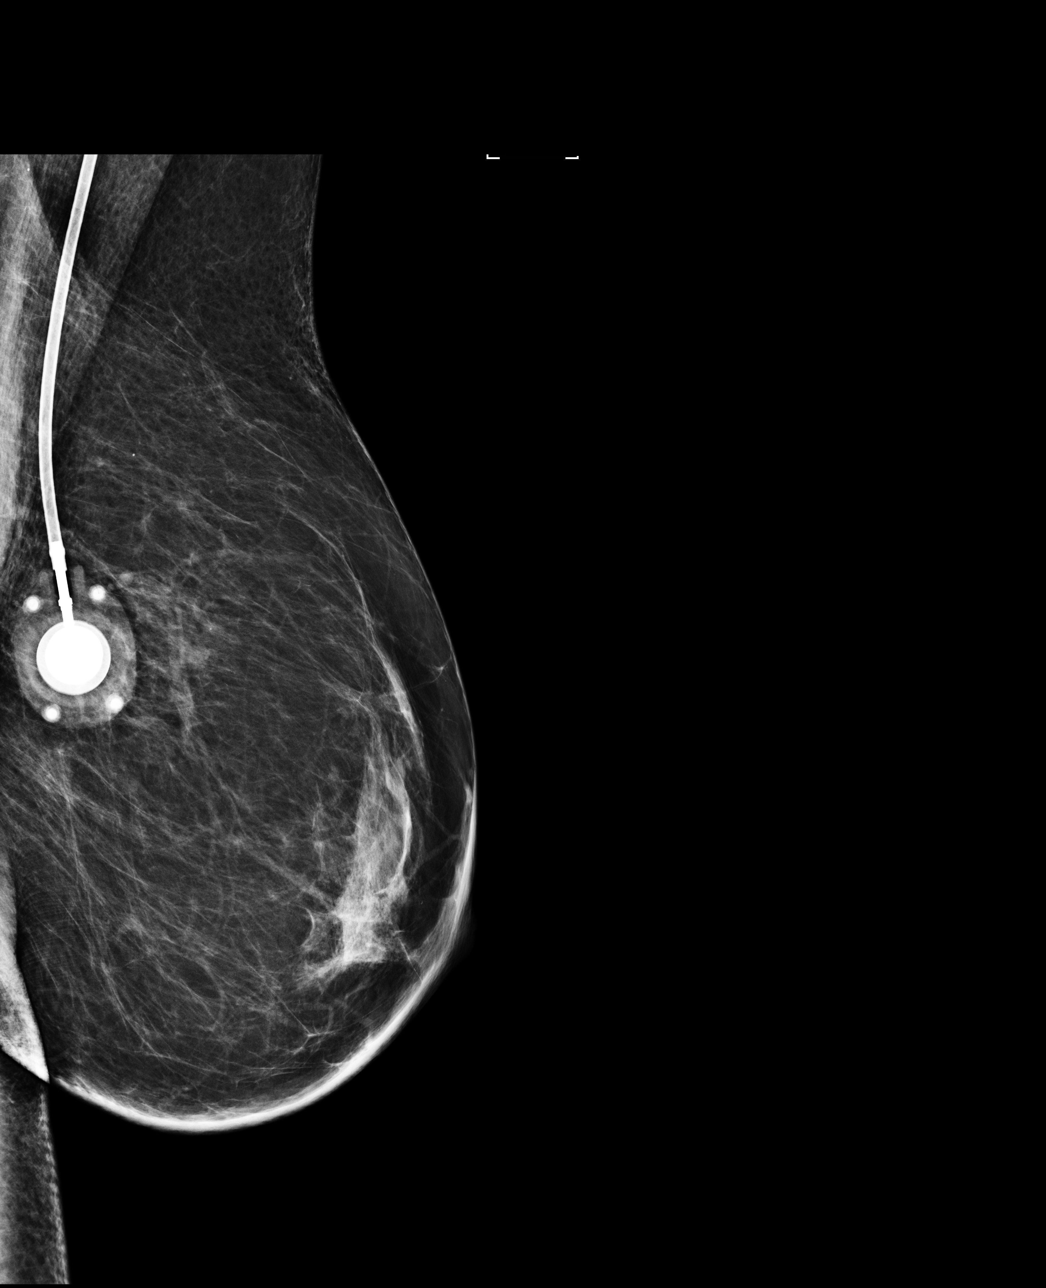

[R CC]
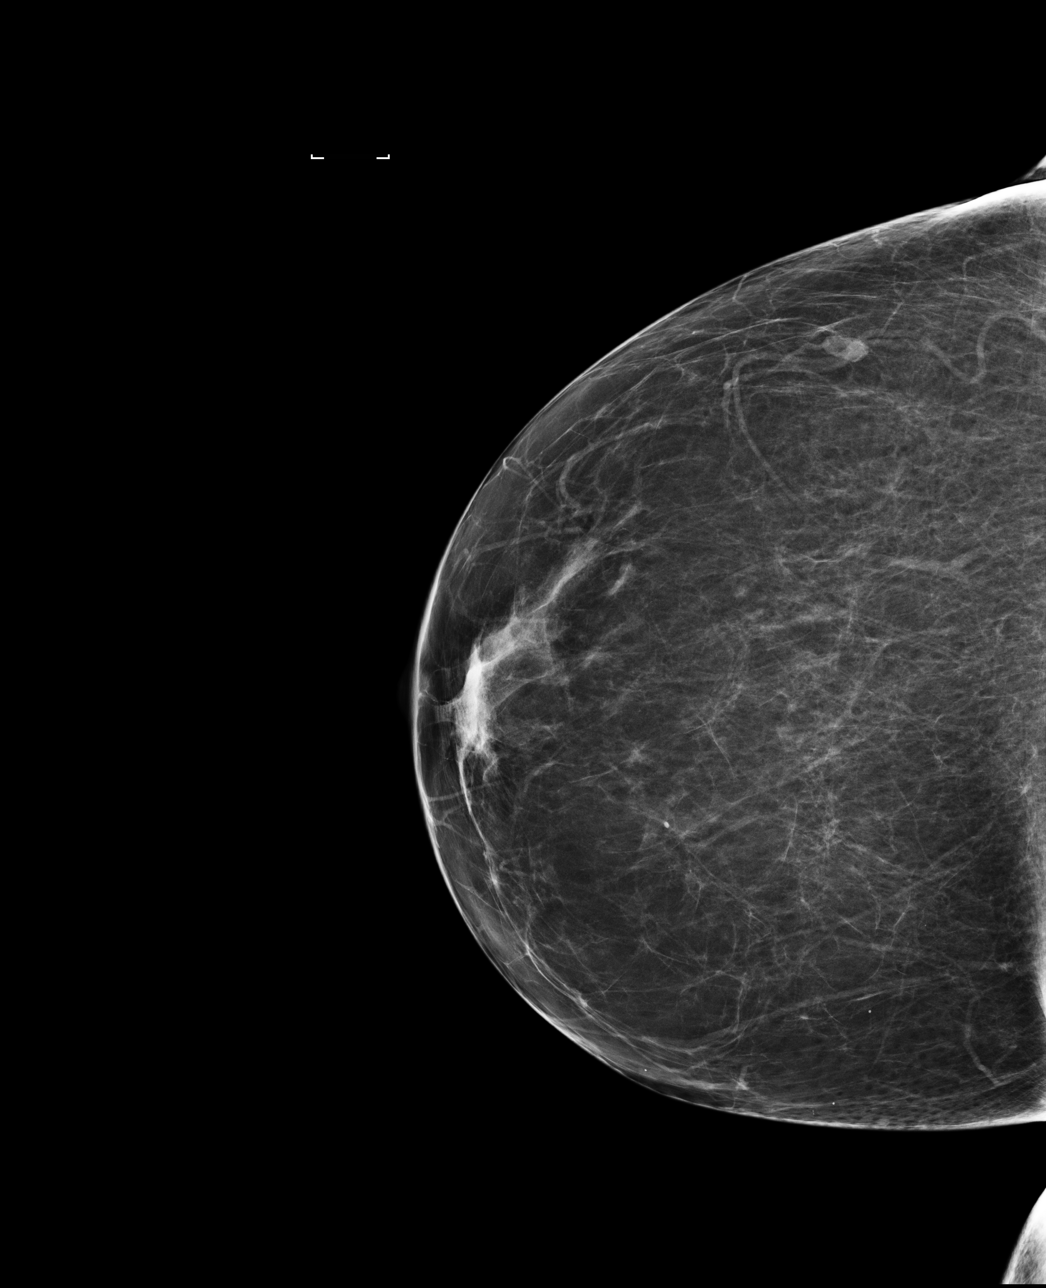

[L CC]
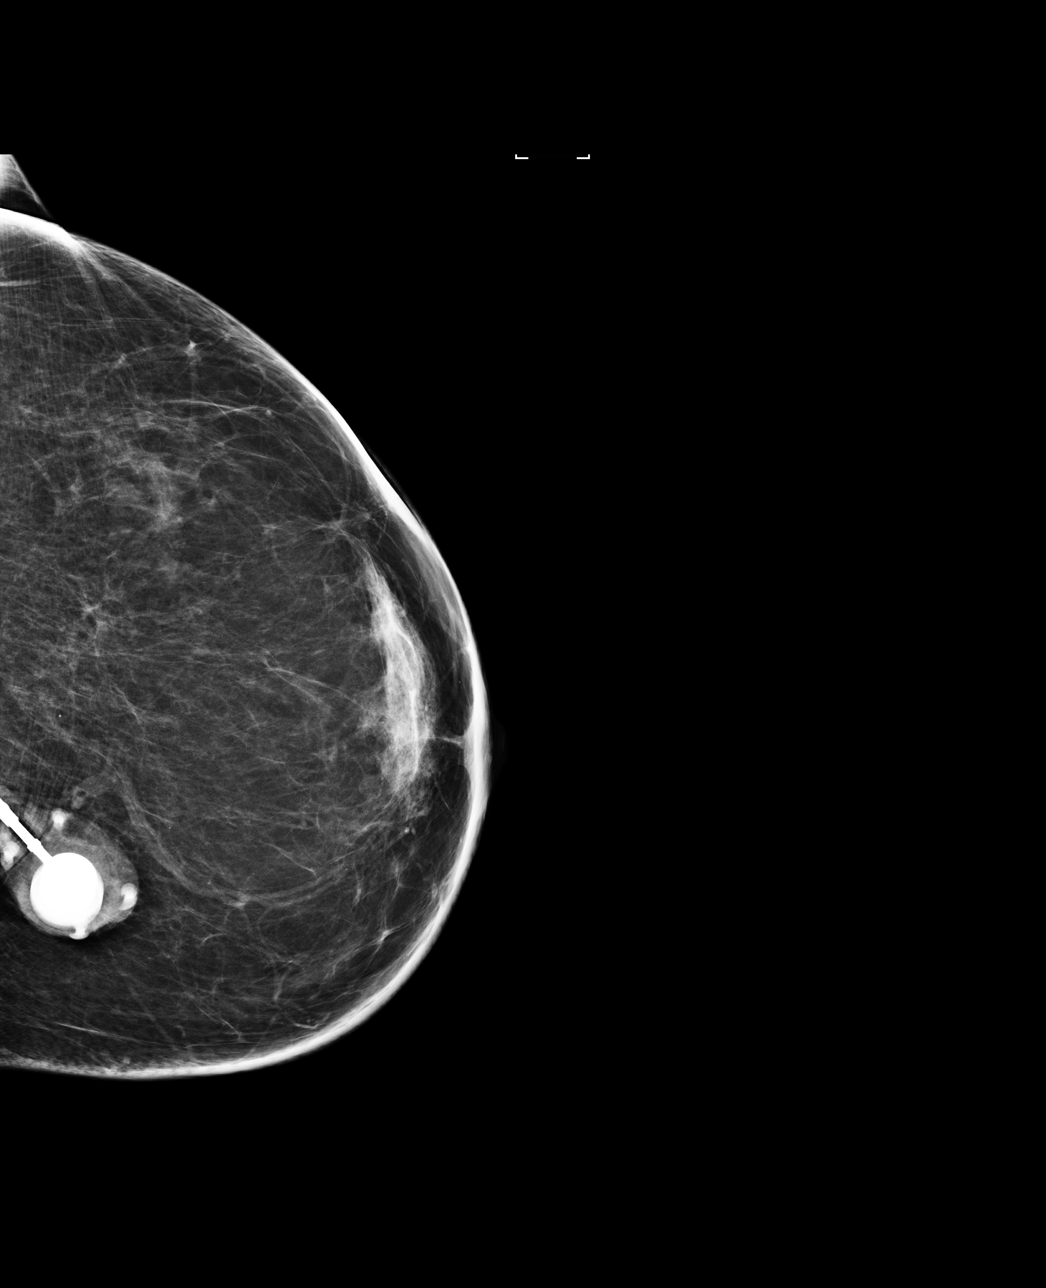

[4 of 4 positions shown; findings below may reference images not displayed]

ACR Breast Density Category b: There are scattered areas of
fibroglandular density.
FINDINGS: There are no findings suspicious for malignancy. Images were
processed with CAD.
IMPRESSION: No mammographic evidence of malignancy. A result letter of this
screening mammogram will be mailed directly to the patient.

RECOMMENDATION:
Screening mammogram in one year. (Code:AS-G-LCT)

BI-RADS CATEGORY  1: Negative.

## 2016-07-25 ENCOUNTER — Inpatient Hospital Stay: Payer: PPO | Attending: Internal Medicine

## 2016-07-25 DIAGNOSIS — I480 Paroxysmal atrial fibrillation: Secondary | ICD-10-CM | POA: Diagnosis not present

## 2016-07-25 DIAGNOSIS — C569 Malignant neoplasm of unspecified ovary: Secondary | ICD-10-CM | POA: Diagnosis not present

## 2016-07-25 LAB — BASIC METABOLIC PANEL
Anion gap: 7 (ref 5–15)
BUN: 16 mg/dL (ref 6–20)
CHLORIDE: 104 mmol/L (ref 101–111)
CO2: 27 mmol/L (ref 22–32)
CREATININE: 0.79 mg/dL (ref 0.44–1.00)
Calcium: 9.1 mg/dL (ref 8.9–10.3)
Glucose, Bld: 96 mg/dL (ref 65–99)
Potassium: 4.1 mmol/L (ref 3.5–5.1)
SODIUM: 138 mmol/L (ref 135–145)

## 2016-07-25 LAB — CBC WITH DIFFERENTIAL/PLATELET
BASOS ABS: 0 10*3/uL (ref 0–0.1)
BASOS PCT: 1 %
EOS ABS: 0 10*3/uL (ref 0–0.7)
EOS PCT: 1 %
HCT: 33.9 % — ABNORMAL LOW (ref 35.0–47.0)
HEMOGLOBIN: 11.6 g/dL — AB (ref 12.0–16.0)
LYMPHS ABS: 2.1 10*3/uL (ref 1.0–3.6)
Lymphocytes Relative: 42 %
MCH: 35.6 pg — AB (ref 26.0–34.0)
MCHC: 34.3 g/dL (ref 32.0–36.0)
MCV: 103.8 fL — ABNORMAL HIGH (ref 80.0–100.0)
Monocytes Absolute: 0.5 10*3/uL (ref 0.2–0.9)
Monocytes Relative: 9 %
NEUTROS PCT: 47 %
Neutro Abs: 2.5 10*3/uL (ref 1.4–6.5)
PLATELETS: 145 10*3/uL — AB (ref 150–440)
RBC: 3.26 MIL/uL — AB (ref 3.80–5.20)
RDW: 14.5 % (ref 11.5–14.5)
WBC: 5.1 10*3/uL (ref 3.6–11.0)

## 2016-08-06 ENCOUNTER — Ambulatory Visit (INDEPENDENT_AMBULATORY_CARE_PROVIDER_SITE_OTHER): Payer: PPO | Admitting: Family Medicine

## 2016-08-06 ENCOUNTER — Telehealth: Payer: Self-pay | Admitting: *Deleted

## 2016-08-06 VITALS — BP 98/72 | HR 100 | Temp 99.1°F | Resp 18 | Wt 183.0 lb

## 2016-08-06 DIAGNOSIS — J0110 Acute frontal sinusitis, unspecified: Secondary | ICD-10-CM

## 2016-08-06 DIAGNOSIS — C569 Malignant neoplasm of unspecified ovary: Secondary | ICD-10-CM | POA: Diagnosis not present

## 2016-08-06 DIAGNOSIS — E039 Hypothyroidism, unspecified: Secondary | ICD-10-CM | POA: Diagnosis not present

## 2016-08-06 MED ORDER — AMOXICILLIN-POT CLAVULANATE 875-125 MG PO TABS: 1.0000 | ORAL_TABLET | Freq: Two times a day (BID) | ORAL | 0 refills | Status: DC

## 2016-08-06 NOTE — Progress Notes (Signed)
Jamie Conner  MRN: [id number] DOB: 1941-01-04  Subjective:  HPI  Patient is here to discuss her not feeling good. Symptoms started Friday 08/03/16, drainage, head congestion, nasal congestion, throbbing headache, chills, sweats, temperature has been 99.7 and today 99.1. Cough is present slightly productive and feels deep. She has had some nausea and took Zofran. Decreased appetite present, myalgia. All of this started with a sore throat to begin with and this has subside. Patient states they were around a caregiver for her husband's mom who had similar symptoms and thinks maybe got it from her.  She has taking Tussin DM and Tylenol Patient Active Problem List   Diagnosis Date Noted  . Encounter for monitoring cardiotoxic drug therapy 02/02/2016  . Breast cancer in female [facility]) 12/13/2015  . Peptic ulcer disease 12/12/2015  . DDD (degenerative disc disease), lumbosacral 12/12/2015  . Hyperlipidemia 12/12/2015  . Acute anxiety 12/12/2015  . Insomnia 12/12/2015  . Allergic rhinitis 12/12/2015  . GERD (gastroesophageal reflux disease) 12/12/2015  . Internal hemorrhoid 12/12/2015  . OA (osteoarthritis) 12/12/2015  . Osteopenia 12/12/2015  . Hypothyroid 04/11/2015  . Benign essential HTN 02/01/2015  . Retroperitoneal fibrosis   . Combined fat and carbohydrate induced hyperlipemia 01/04/2015  . Awareness of heartbeats 01/04/2015  . Beat, premature ventricular 01/04/2015  . Ovary cancer, unspecified laterality ([place]) 12/16/2014  . MI (mitral incompetence) 09/02/2014  . AF (paroxysmal atrial fibrillation) ([place]) 09/02/2014    Past Medical History:  Diagnosis Date  . Atrial fibrillation ([place])   . Cancer of breast ([place]) 1992   Left  . GERD (gastroesophageal reflux disease)   . Hyperlipidemia   . Hypothyroidism   . Mitral insufficiency   . Ovarian cancer [facility])    s/p BSO optimal tumor debulking May 2016  . PVC (premature ventricular contraction)     Social History   Social  History  . Marital status: Married    Spouse name: N/A  . Number of children: N/A  . Years of education: N/A   Occupational History  . Not on file.   Social History Main Topics  . Smoking status: Never Smoker  . Smokeless tobacco: Never Used  . Alcohol use No  . Drug use: No  . Sexual activity: Not Currently   Other Topics Concern  . Not on file   Social History Narrative  . No narrative on file    Outpatient Encounter Prescriptions as of 08/06/2016  Medication Sig Note  . acetaminophen (TYLENOL) 500 MG chewable tablet Chew 500 mg by mouth every 6 (six) hours as needed for pain. 12/22/2014: Doesn't now date  . chlorhexidine (PERIDEX) 0.12 % solution  06/11/2016: Received from: External Pharmacy  . Cholecalciferol (VITAMIN D) 2000 units tablet Take 1 tablet (2,000 Units total) by mouth daily.   Marland Kitchen Dextromethorphan-Guaifenesin (ROBITUSSIN DM) 10-100 MG/5ML liquid Take 5 mLs by mouth every 12 (twelve) hours.   Marland Kitchen levothyroxine (SYNTHROID, LEVOTHROID) 50 MCG tablet Take 1 tablet (50 mcg total) by mouth daily.   Marland Kitchen lidocaine-prilocaine (EMLA) cream APPLY TO AFFECTED AREA IF NEEDED   . LORazepam (ATIVAN) 0.5 MG tablet Take 1 tablet (0.5 mg total) by mouth at bedtime.   . Multiple Vitamin (MULTIVITAMIN) capsule Take 1 capsule by mouth daily.   . ondansetron (ZOFRAN) 4 MG tablet Take 4 mg by mouth every 6 (six) hours as needed for nausea or vomiting. Reported on 02/20/2016   . pantoprazole (PROTONIX) 40 MG tablet Take 1 tablet (40 mg total) by mouth daily.   Marland Kitchen  polyethylene glycol (MIRALAX / GLYCOLAX) packet Take 17 g by mouth daily.   . propafenone (RYTHMOL) 225 MG tablet Take 225 mg by mouth 2 (two) times daily.   Marland Kitchen lactulose (CHRONULAC) 10 GM/15ML solution TAKE 15MLS (10G TOTAL) BY MOUTH THREE TIMES A DAY AS DIRECTED BY PHYSICIAN. (Patient not taking: Reported on 08/06/2016)   . loratadine (CLARITIN) 10 MG tablet Take 10 mg by mouth daily.   . mometasone (ELOCON) 0.1 % cream Apply 1  application topically daily. Apply to affected area no more than 2 weeks a month. (Patient not taking: Reported on 08/06/2016)   . psyllium (METAMUCIL) 58.6 % powder Take 1 packet by mouth daily.   Marland Kitchen senna (SENOKOT) 8.6 MG TABS tablet Take 2 tablets by mouth daily.   Marland Kitchen triamcinolone ointment (KENALOG) 0.5 % Apply 1 application topically 2 (two) times daily. (Patient not taking: Reported on 08/06/2016)    Facility-Administered Encounter Medications as of 08/06/2016  Medication  . sodium chloride 0.9 % injection 10 mL    Allergies  Allergen Reactions  . Carafate [Sucralfate] Rash  . Sulfa Antibiotics Rash    Review of Systems  Constitutional: Positive for malaise/fatigue.       Decreased appetite  HENT: Positive for congestion and sinus pain.   Respiratory: Positive for cough.   Cardiovascular: Negative.   Gastrointestinal: Positive for nausea.  Musculoskeletal: Positive for joint pain and myalgias.  Neurological: Positive for weakness.    Objective:  BP 98/72   Pulse 100   Temp 99.1 F (37.3 C)   Resp 18   Wt 183 lb (83 kg)   SpO2 97%   BMI 31.91 kg/m   Physical Exam  Constitutional: She is oriented to person, place, and time and well-developed, well-nourished, and in no distress. She has a sickly appearance.  HENT:  Head: Normocephalic and atraumatic.  Right Ear: External ear normal.  Left Ear: External ear normal.  Nose: Right sinus exhibits no frontal sinus tenderness. Left sinus exhibits frontal sinus tenderness.  Mouth/Throat: Oropharynx is clear and moist.  Eyes: Conjunctivae are normal. Pupils are equal, round, and reactive to light.  Neck: Normal range of motion. Neck supple.  Cardiovascular: Normal rate, regular rhythm, normal heart sounds and intact distal pulses.   No murmur heard. Pulmonary/Chest: Effort normal and breath sounds normal. No respiratory distress. She has no wheezes.  Neurological: She is alert and oriented to person, place, and time.     Assessment and Plan :  1. Acute non-recurrent frontal sinusitis I think this is probably viral but with her immune system down and what she went through chemo will go ahead and treat as a sinus infection with Augmentin. Call us if gets worse or not better.  2. Ovarian cancer, unspecified laterality [facility]) Seeing  oncologist.  3. Hypothyroidism, unspecified type  HPI, Exam and A&P transcribed under direction and in the presence of Miguel Aschoff, MD. I have done the exam and reviewed the chart and it is accurate to the best of my knowledge. Development worker, community has been used and  any errors in dictation or transcription are unintentional. Miguel Aschoff M.D. [facility]

## 2016-08-06 NOTE — Telephone Encounter (Signed)
Called complaining of "a lot of congestion and fever" asking if she should call PCP or if we will take care of this for her. Her last treatment was 11/22. Please advise

## 2016-08-06 NOTE — Telephone Encounter (Signed)
Advised per VO Dr Rogue Bussing to call PCP

## 2016-08-15 ENCOUNTER — Ambulatory Visit
Admission: RE | Admit: 2016-08-15 | Discharge: 2016-08-15 | Disposition: A | Discharge status: Home / Self Care | Payer: PPO | Source: Ambulatory Visit | Attending: Internal Medicine | Admitting: Internal Medicine

## 2016-08-15 ENCOUNTER — Inpatient Hospital Stay: Payer: PPO

## 2016-08-15 DIAGNOSIS — I2510 Atherosclerotic heart disease of native coronary artery without angina pectoris: Secondary | ICD-10-CM | POA: Insufficient documentation

## 2016-08-15 DIAGNOSIS — Z8543 Personal history of malignant neoplasm of ovary: Secondary | ICD-10-CM | POA: Diagnosis not present

## 2016-08-15 DIAGNOSIS — K5790 Diverticulosis of intestine, part unspecified, without perforation or abscess without bleeding: Secondary | ICD-10-CM | POA: Diagnosis not present

## 2016-08-15 DIAGNOSIS — I700 Atherosclerosis of aorta: Secondary | ICD-10-CM | POA: Diagnosis not present

## 2016-08-15 DIAGNOSIS — C569 Malignant neoplasm of unspecified ovary: Secondary | ICD-10-CM | POA: Diagnosis not present

## 2016-08-15 DIAGNOSIS — K5730 Diverticulosis of large intestine without perforation or abscess without bleeding: Secondary | ICD-10-CM | POA: Insufficient documentation

## 2016-08-15 DIAGNOSIS — C659 Malignant neoplasm of unspecified renal pelvis: Secondary | ICD-10-CM | POA: Insufficient documentation

## 2016-08-15 LAB — CBC WITH DIFFERENTIAL/PLATELET
BASOS ABS: 0 10*3/uL (ref 0–0.1)
Basophils Relative: 0 %
EOS ABS: 0 10*3/uL (ref 0–0.7)
EOS PCT: 1 %
HCT: 37 % (ref 35.0–47.0)
Hemoglobin: 12.8 g/dL (ref 12.0–16.0)
LYMPHS PCT: 49 %
Lymphs Abs: 2 10*3/uL (ref 1.0–3.6)
MCH: 35.3 pg — ABNORMAL HIGH (ref 26.0–34.0)
MCHC: 34.4 g/dL (ref 32.0–36.0)
MCV: 102.5 fL — AB (ref 80.0–100.0)
MONO ABS: 0.5 10*3/uL (ref 0.2–0.9)
Monocytes Relative: 13 %
Neutro Abs: 1.4 10*3/uL (ref 1.4–6.5)
Neutrophils Relative %: 37 %
PLATELETS: 269 10*3/uL (ref 150–440)
RBC: 3.61 MIL/uL — ABNORMAL LOW (ref 3.80–5.20)
RDW: 14.8 % — AB (ref 11.5–14.5)
WBC: 3.9 10*3/uL (ref 3.6–11.0)

## 2016-08-15 LAB — COMPREHENSIVE METABOLIC PANEL
ALBUMIN: 3.7 g/dL (ref 3.5–5.0)
ALT: 15 U/L (ref 14–54)
AST: 22 U/L (ref 15–41)
Alkaline Phosphatase: 72 U/L (ref 38–126)
Anion gap: 8 (ref 5–15)
BUN: 12 mg/dL (ref 6–20)
CALCIUM: 9.3 mg/dL (ref 8.9–10.3)
CHLORIDE: 101 mmol/L (ref 101–111)
CO2: 27 mmol/L (ref 22–32)
Creatinine, Ser: 0.91 mg/dL (ref 0.44–1.00)
GFR calc Af Amer: >60 mL/min (ref >60)
GLUCOSE: 92 mg/dL (ref 65–99)
Potassium: 4.1 mmol/L (ref 3.5–5.1)
Sodium: 136 mmol/L (ref 135–145)
TOTAL PROTEIN: 7.1 g/dL (ref 6.5–8.1)
Total Bilirubin: 0.6 mg/dL (ref 0.3–1.2)

## 2016-08-15 MED ORDER — IOPAMIDOL (ISOVUE-300) INJECTION 61%
100.0000 mL | Freq: Once | INTRAVENOUS | Status: AC | PRN
  Administered 2016-08-15: 100 mL via INTRAVENOUS

## 2016-08-16 LAB — CA 125: CA 125: 64.5 U/mL — ABNORMAL HIGH (ref 0.0–38.1)

## 2016-08-22 ENCOUNTER — Inpatient Hospital Stay: Payer: PPO

## 2016-08-22 ENCOUNTER — Inpatient Hospital Stay: Payer: PPO | Attending: Internal Medicine | Admitting: Internal Medicine

## 2016-08-22 VITALS — BP 114/68 | HR 65 | Temp 96.7°F | Wt 184.1 lb

## 2016-08-22 DIAGNOSIS — Z90722 Acquired absence of ovaries, bilateral: Secondary | ICD-10-CM | POA: Insufficient documentation

## 2016-08-22 DIAGNOSIS — Z452 Encounter for adjustment and management of vascular access device: Secondary | ICD-10-CM | POA: Diagnosis not present

## 2016-08-22 DIAGNOSIS — Z808 Family history of malignant neoplasm of other organs or systems: Secondary | ICD-10-CM | POA: Diagnosis not present

## 2016-08-22 DIAGNOSIS — I4891 Unspecified atrial fibrillation: Secondary | ICD-10-CM | POA: Diagnosis not present

## 2016-08-22 DIAGNOSIS — I493 Ventricular premature depolarization: Secondary | ICD-10-CM | POA: Diagnosis not present

## 2016-08-22 DIAGNOSIS — E785 Hyperlipidemia, unspecified: Secondary | ICD-10-CM | POA: Diagnosis not present

## 2016-08-22 DIAGNOSIS — C786 Secondary malignant neoplasm of retroperitoneum and peritoneum: Secondary | ICD-10-CM | POA: Diagnosis not present

## 2016-08-22 DIAGNOSIS — Z79899 Other long term (current) drug therapy: Secondary | ICD-10-CM | POA: Insufficient documentation

## 2016-08-22 DIAGNOSIS — I700 Atherosclerosis of aorta: Secondary | ICD-10-CM | POA: Insufficient documentation

## 2016-08-22 DIAGNOSIS — Z800 Family history of malignant neoplasm of digestive organs: Secondary | ICD-10-CM | POA: Insufficient documentation

## 2016-08-22 DIAGNOSIS — I340 Nonrheumatic mitral (valve) insufficiency: Secondary | ICD-10-CM | POA: Diagnosis not present

## 2016-08-22 DIAGNOSIS — K5730 Diverticulosis of large intestine without perforation or abscess without bleeding: Secondary | ICD-10-CM | POA: Diagnosis not present

## 2016-08-22 DIAGNOSIS — Z23 Encounter for immunization: Secondary | ICD-10-CM | POA: Diagnosis not present

## 2016-08-22 DIAGNOSIS — E039 Hypothyroidism, unspecified: Secondary | ICD-10-CM | POA: Insufficient documentation

## 2016-08-22 DIAGNOSIS — Z853 Personal history of malignant neoplasm of breast: Secondary | ICD-10-CM | POA: Diagnosis not present

## 2016-08-22 DIAGNOSIS — F419 Anxiety disorder, unspecified: Secondary | ICD-10-CM | POA: Diagnosis not present

## 2016-08-22 DIAGNOSIS — Z803 Family history of malignant neoplasm of breast: Secondary | ICD-10-CM | POA: Diagnosis not present

## 2016-08-22 DIAGNOSIS — K219 Gastro-esophageal reflux disease without esophagitis: Secondary | ICD-10-CM | POA: Diagnosis not present

## 2016-08-22 DIAGNOSIS — Z9049 Acquired absence of other specified parts of digestive tract: Secondary | ICD-10-CM | POA: Insufficient documentation

## 2016-08-22 DIAGNOSIS — C569 Malignant neoplasm of unspecified ovary: Secondary | ICD-10-CM

## 2016-08-22 DIAGNOSIS — Z90710 Acquired absence of both cervix and uterus: Secondary | ICD-10-CM | POA: Diagnosis not present

## 2016-08-22 DIAGNOSIS — Z95828 Presence of other vascular implants and grafts: Secondary | ICD-10-CM

## 2016-08-22 MED ORDER — HEPARIN SOD (PORK) LOCK FLUSH 100 UNIT/ML IV SOLN
500.0000 [IU] | Freq: Once | INTRAVENOUS | Status: AC
  Administered 2016-08-22: 500 [IU] via INTRAVENOUS

## 2016-08-22 MED ORDER — RUCAPARIB CAMSYLATE 300 MG PO TABS: 600.0000 mg | ORAL_TABLET | Freq: Two times a day (BID) | ORAL | 6 refills | Status: DC

## 2016-08-22 MED ORDER — SODIUM CHLORIDE 0.9% FLUSH
10.0000 mL | INTRAVENOUS | Status: DC | PRN
  Administered 2016-08-22: 10 mL via INTRAVENOUS
  Filled 2016-08-22: qty 10

## 2016-08-22 NOTE — Assessment & Plan Note (Addendum)
Recurrent ovarian cancer Currently s/p carboplatin and Doxil every 4 weeks x6 cycles; slight increase in the CA-125. CT scan shows slight increase in the peritoneal deposits in the pelvis.   # Discussed with the patient and husband that the treatments are ongoing/indefinite. Also discussed the role of use of PARP inhibitors [Zejula or rupacarib] for maintenance/treatment of ovarian cancer. Patient is in [place]- mutated. We will check for HRD- if positive would recommend inhibitor. Discussed the potential side effects of PARP inhibitors.  Script for rubraca was given.   # Other treatment options include- Avastin and gemcitabine. Discussed with Dr.Berchuck.   # follow up in 4 weeks/ labs/ chemo again 4 weeks/labs/ca 125/port flush.

## 2016-08-22 NOTE — Patient Instructions (Signed)
Rucaparib tablets What is this medicine? RUCAPARIB (roo KAP a rib) is a chemotherapy drug. It targets specific enzymes within cancer cells and stops the cancer cell from growing. This medicine is used to treat ovarian cancer. COMMON BRAND NAME(S): Rubraca What should I tell my health care provider before I take this medicine? They need to know if you have any of these conditions: -low blood counts, like low white cell, platelet, or red cell counts -an unusual or allergic reaction to rucaparib, other medicines, foods, dyes, or preservatives -pregnant or trying to get pregnant -breast-feeding How should I use this medicine? Take this medicine by mouth with a glass of water. Follow the directions on the prescription label. You can take it with or without food. If it upsets your stomach, take it with food. Take your medicine at regular intervals. Do not take it more often than directed. Do not stop taking except on your doctor's advice. Talk to your pediatrician regarding the use of this medicine in children. Special care may be needed. What if I miss a dose? If you miss a dose, take it as soon as you can. If it is almost time for your next dose, take only that dose. Do not take double or extra doses. What may interact with this medicine? Interactions have not been studied. What should I watch for while using this medicine? This drug may make you feel generally unwell. This is not uncommon, as chemotherapy can affect healthy cells as well as cancer cells. Report any side effects. Continue your course of treatment even though you feel ill unless your doctor tells you to stop. You will need blood work done while you are taking this medicine. Do not become pregnant while taking this medicine. Women should inform their doctor if they wish to become pregnant or think they might be pregnant. There is a potential for serious side effects to an unborn child. Talk to your health care professional or pharmacist  for more information. Women who are able to become pregnant should use effective birth control during treatment and for at least 6 months after receiving the last dose. Talk to your healthcare provider about birth control methods that may be right for you. Do not breast-feed an infant while taking this medicine or for 2 weeks after your last dose. Call your doctor or health care professional for advice if you get a fever, chills or sore throat, or other symptoms of a cold or flu. Do not treat yourself. This drug decreases your body's ability to fight infections. Try to avoid being around people who are sick. This medicine may increase your risk to bruise or bleed. Call your doctor or health care professional if you notice any unusual bleeding. If you are going to have surgery or any other procedures, tell your doctor you are taking this medicine. What side effects may I notice from receiving this medicine? Side effects that you should report to your doctor or health care professional as soon as possible: -allergic reactions like skin rash, itching or hives, swelling of the face, lips, or tongue -breathing problems, like shortness of breath, cough, or wheezing -signs and symptoms of bleeding such as bloody or black, tarry stools; red or dark-brown urine; spitting up blood or brown material that looks like coffee grounds; red spots on the skin; unusual bruising or bleeding from the eye, gums, or nose -signs and symptoms of infection like fever or chills; cough; sore throat; pain or trouble passing urine Side effects that  usually do not require medical attention (Report these to your doctor or health care professional if they continue or are bothersome.): -changes in taste -constipation -diarrhea -dizziness -loss of appetite -nausea, vomiting -sensitivity to light -stomach pain -weak or tired Where should I keep my medicine? Keep out of the reach of children. Store between 20 and 25 degrees C (68  and 77 degrees F). Do not store at temperatures greater than 30 degrees C (86 degrees F) and do not take this medicine if you think it may have been stored at a temperature greater than 86 degrees F. Throw away any unused medicine after the expiration date.  2017 Elsevier/Gold Standard (2015-09-13 09:13:17)

## 2016-08-22 NOTE — Progress Notes (Addendum)
[facility] OFFICE PROGRESS NOTE  Patient Care Team: Jerrol Banana., MD as PCP - General (Family Medicine) [facility] Gaetana Michaelis, MD as Referring Physician (Obstetrics and Gynecology) Robert Bellow, MD as Consulting Physician (General Surgery)  Ovarian cancer [facility])   Staging form: Ovary, AJCC 7th Edition     Clinical: Stage IIIC (T3c, N0, M0) - Unsigned    Oncology History   # April- MAY 2016- OVARIAN CANCER STAGE IIIC; CA-125 +2300+;  s/p OPTIMAL DEBULKING SURGERY   # MAY 2016[name] [finished in Sep 19th 2017]  # MAY CT 2017- RECURRENT/Peritoneal carcinomatosis/pelvic implant ~1.2cm/Ca-125-34;   # July 3rd- CARBO-DOXIL q 4W [with neulasta]; CT May 11 2016- slight increase peritoneal / stable nodules. Cont chemo [finished DEC 2017]. DEC 28th 2017 CT- Increase in peritoneal deposits; increasing Ca 125 [60s]  # G-1-2 hand foot syndrome-   # LEFT BREAST CA s/p Lumpec & RT s/p TAM   # Afib [cardiology]; JUNE 2017- MUGA 62%  MOLECULAR TESTING- ATM genetic mutation      Ovary cancer, unspecified laterality ([place])   12/16/2014 Initial Diagnosis    Ovarian cancer       INTERVAL HISTORY:  76 year old female patient with above history of ovarian cancer recurrent Platinum sensitive- currently on Doxil and carboplatin status post cycle 6 here for follow-up/ Review the results of the restaging CAT scan.  Denies any fatigue. Denies any nausea vomiting or abdominal pain. Denies any abdominal distention. No chest pain or shortness of breath or cough. No rash and palms and soles.  REVIEW OF SYSTEMS:  A complete 10 point review of system is done which is negative except mentioned above/history of present illness.   PAST MEDICAL HISTORY :  Past Medical History:  Diagnosis Date  . Atrial fibrillation ([place])   . Cancer of breast ([place]) 1992   Left  . GERD (gastroesophageal reflux disease)   . Hyperlipidemia   . Hypothyroidism   . Mitral insufficiency    . Ovarian cancer [facility])    s/p BSO optimal tumor debulking May 2016  . PVC (premature ventricular contraction)     PAST SURGICAL HISTORY :   Past Surgical History:  Procedure Laterality Date  . ABDOMINAL HYSTERECTOMY    . BILATERAL SALPINGOOPHORECTOMY  May 2016   with optimal tumor debulking   . BREAST SURGERY     Left  . CESAREAN SECTION     x2  . CHOLECYSTECTOMY    . COLONOSCOPY  2006  . DEBULKING N/A 12/22/2014   Procedure: DEBULKING;  Surgeon: Gillis Ends, MD;  Location: [facility] ORS;  Service: Gynecology;  Laterality: N/A;  . LAPAROTOMY N/A 12/22/2014   Procedure: EXPLORATORY LAPAROTOMY;  Surgeon: Robert Bellow, MD;  Location: [facility] ORS;  Service: General;  Laterality: N/A;  . LAPAROTOMY WITH STAGING N/A 12/22/2014   Procedure: LAPAROTOMY WITH STAGING;  Surgeon: Gillis Ends, MD;  Location: [facility] ORS;  Service: Gynecology;  Laterality: N/A;  . PERIPHERAL VASCULAR CATHETERIZATION Left 12/22/2014   Procedure: PORTA CATH INSERTION;  Surgeon: Robert Bellow, MD;  Location: [facility] ORS;  Service: General;  Laterality: Left;  . PORTACATH PLACEMENT Right 12/22/2014   Procedure: INSERTION PORT-A-CATH;  Surgeon: Robert Bellow, MD;  Location: [facility] ORS;  Service: General;  Laterality: Right;  . SALPINGOOPHORECTOMY Bilateral 12/22/2014   Procedure: SALPINGO OOPHORECTOMY;  Surgeon: Gillis Ends, MD;  Location: [facility] ORS;  Service: Gynecology;  Laterality: Bilateral;  . UPPER GI ENDOSCOPY  09/25/04   hiatus  hernia, schatzki ring and a single gastric polyp    FAMILY HISTORY :   Family History  Problem Relation Age of Onset  . Cancer Mother     breast, throat, and stomach  . Breast cancer Mother   . Heart disease Father   . Pulmonary embolism Sister   . Cancer Brother 60    angiosarcoma of the chest; carcinoid small intestinal tumor  . Hypertension Brother   . Pulmonary embolism Other   . Cancer Other   . Cancer Maternal Aunt     breast cancer  . Cancer  Maternal Grandfather 21    pancreatic    SOCIAL HISTORY:   Social History  Substance Use Topics  . Smoking status: Never Smoker  . Smokeless tobacco: Never Used  . Alcohol use No    ALLERGIES:  is allergic to carafate [sucralfate] and sulfa antibiotics.  MEDICATIONS:  Current Outpatient Prescriptions  Medication Sig Dispense Refill  . acetaminophen (TYLENOL) 500 MG chewable tablet Chew 500 mg by mouth every 6 (six) hours as needed for pain.    . chlorhexidine (PERIDEX) 0.12 % solution Use as directed 5 mLs in the mouth or throat as needed.     . Cholecalciferol (VITAMIN D) 2000 units tablet Take 1 tablet (2,000 Units total) by mouth daily. 30 tablet 12  . levothyroxine (SYNTHROID, LEVOTHROID) 50 MCG tablet Take 1 tablet (50 mcg total) by mouth daily. 30 tablet 12  . lidocaine-prilocaine (EMLA) cream APPLY TO AFFECTED AREA IF NEEDED 30 g 1  . LORazepam (ATIVAN) 0.5 MG tablet Take 1 tablet (0.5 mg total) by mouth at bedtime. 30 tablet 3  . Multiple Vitamin (MULTIVITAMIN) capsule Take 1 capsule by mouth daily.    . ondansetron (ZOFRAN) 4 MG tablet Take 4 mg by mouth every 6 (six) hours as needed for nausea or vomiting. Reported on 02/20/2016    . pantoprazole (PROTONIX) 40 MG tablet Take 1 tablet (40 mg total) by mouth daily. 30 tablet 12  . polyethylene glycol (MIRALAX / GLYCOLAX) packet Take 17 g by mouth daily. 30 each 4  . propafenone (RYTHMOL) 225 MG tablet Take 225 mg by mouth 2 (two) times daily.    Marland Kitchen lactulose (CHRONULAC) 10 GM/15ML solution TAKE 15MLS (10G TOTAL) BY MOUTH THREE TIMES A DAY AS DIRECTED BY PHYSICIAN. (Patient not taking: Reported on 08/22/2016) 240 mL 1  . Rucaparib Camsylate (RUBRACA) 300 MG TABS Take 600 mg by mouth 2 (two) times daily. 120 tablet 6   No current facility-administered medications for this visit.    Facility-Administered Medications Ordered in Other Visits  Medication Dose Route Frequency Provider Last Rate Last Dose  . sodium chloride 0.9 %  injection 10 mL  10 mL Intracatheter PRN Forest Gleason, MD   10 mL at 02/07/15 0930    PHYSICAL EXAMINATION: ECOG PERFORMANCE STATUS: 0 - Asymptomatic  BP 114/68 (BP Location: Left Arm, Patient Position: Sitting)   Pulse 65   Temp (!) 96.7 F (35.9 C) (Tympanic)   Wt 184 lb 2 oz (83.5 kg)   BMI 32.10 kg/m   Filed Weights   08/22/16 1121  Weight: 184 lb 2 oz (83.5 kg)    GENERAL: Well-nourished well-developed; Alert, no distress and comfortAccompanied by her husband. EYES: no pallor or icterus OROPHARYNX: no thrush or ulceration; good dentition  NECK: supple, no masses felt LYMPH:  no palpable lymphadenopathy in the cervical, axillary or inguinal regions LUNGS: clear to auscultation and  No wheeze or crackles HEART/CVS: regular rate &  rhythm and no murmurs; No lower extremity edema ABDOMEN:abdomen soft, non-tender and normal bowel sounds Musculoskeletal:no cyanosis of digits and no clubbing  PSYCH: alert & oriented x 3 with fluent speech NEURO: no focal motor/sensory deficits SKIN:  Mild erythema noted bilateral upper extremities and feet. No desquamation.  LABORATORY DATA:  I have reviewed the data as listed    Component Value Date/Time   NA 136 08/15/2016 0855   NA 140 12/14/2014 1037   K 4.1 08/15/2016 0855   K 4.4 12/14/2014 1037   CL 101 08/15/2016 0855   CL 106 12/14/2014 1037   CO2 27 08/15/2016 0855   CO2 26 12/14/2014 1037   GLUCOSE 92 08/15/2016 0855   GLUCOSE 87 12/14/2014 1037   BUN 12 08/15/2016 0855   BUN 12 12/14/2014 1037   CREATININE 0.91 08/15/2016 0855   CREATININE 0.97 12/14/2014 1037   CALCIUM 9.3 08/15/2016 0855   CALCIUM 9.2 12/14/2014 1037   PROT 7.1 08/15/2016 0855   PROT 7.3 12/14/2014 1037   ALBUMIN 3.7 08/15/2016 0855   ALBUMIN 3.8 12/14/2014 1037   AST 22 08/15/2016 0855   AST 20 12/14/2014 1037   ALT 15 08/15/2016 0855   ALT 11 (L) 12/14/2014 1037   ALKPHOS 72 08/15/2016 0855   ALKPHOS 138 (H) 12/14/2014 1037   BILITOT 0.6  08/15/2016 0855   BILITOT 0.5 12/14/2014 1037   GFRNONAA >60 08/15/2016 0855   GFRNONAA 59 (L) 12/14/2014 1037   GFRAA >60 08/15/2016 0855   GFRAA >60 12/14/2014 1037    No results found for: SPEP, UPEP  Lab Results  Component Value Date   WBC 3.9 08/15/2016   NEUTROABS 1.4 08/15/2016   HGB 12.8 08/15/2016   HCT 37.0 08/15/2016   MCV 102.5 (H) 08/15/2016   PLT 269 08/15/2016      Chemistry      Component Value Date/Time   NA 136 08/15/2016 0855   NA 140 12/14/2014 1037   K 4.1 08/15/2016 0855   K 4.4 12/14/2014 1037   CL 101 08/15/2016 0855   CL 106 12/14/2014 1037   CO2 27 08/15/2016 0855   CO2 26 12/14/2014 1037   BUN 12 08/15/2016 0855   BUN 12 12/14/2014 1037   CREATININE 0.91 08/15/2016 0855   CREATININE 0.97 12/14/2014 1037   GLU 84 03/16/2014      Component Value Date/Time   CALCIUM 9.3 08/15/2016 0855   CALCIUM 9.2 12/14/2014 1037   ALKPHOS 72 08/15/2016 0855   ALKPHOS 138 (H) 12/14/2014 1037   AST 22 08/15/2016 0855   AST 20 12/14/2014 1037   ALT 15 08/15/2016 0855   ALT 11 (L) 12/14/2014 1037   BILITOT 0.6 08/15/2016 0855   BILITOT 0.5 12/14/2014 1037    IMPRESSION: 1. Today's study demonstrates slight interval growth of several peritoneal implants in the low anatomic pelvis indicative of slight progression of disease. No other new peritoneal deposits are noted, and there is no other metastatic disease noted elsewhere in the chest, abdomen or pelvis. 2. Colonic diverticulosis without evidence of acute diverticulitis at this time. 3. Aortic atherosclerosis, in addition to left main coronary artery disease. Assessment for potential risk factor modification, dietary therapy or pharmacologic therapy may be warranted, if clinically indicated. 4. Additional incidental findings, as above.   Electronically Signed   By: Vinnie Langton M.D.   On: 08/15/2016 14:08   RADIOGRAPHIC STUDIES: I have personally reviewed the radiological images as  listed and agreed with the findings in the  report. No results found.   ASSESSMENT & PLAN:  Ovary cancer, unspecified laterality ([place]) Recurrent ovarian cancer Currently s/p carboplatin and Doxil every 4 weeks x6 cycles; slight increase in the CA-125. CT scan shows slight increase in the peritoneal deposits in the pelvis.   # Discussed with the patient and husband that the treatments are ongoing/indefinite. Also discussed the role of use of PARP inhibitors [Zejula or rupacarib] for maintenance/treatment of ovarian cancer. Patient is in ATM- mutated. We will check for HRD- if positive would recommend inhibitor. Discussed the potential side effects of PARP inhibitors.  Script for rubraca was given.   # Other treatment options include- Avastin and gemcitabine. Discussed with Dr.Berchuck.   # follow up in 4 weeks/ labs/ chemo again 4 weeks/labs/ca 125/port flush.    No orders of the defined types were placed in this encounter.  All questions were answered. The patient knows to call the clinic with any problems, questions or concerns.   Addendum:  HRD testing on the tumor was ordred on Jan 4th.    Cammie Sickle, MD 08/23/2016 12:04 PM

## 2016-08-22 NOTE — Progress Notes (Signed)
Patient here today for follow up.  Patient has no new concerns today  

## 2016-08-23 ENCOUNTER — Telehealth: Payer: Self-pay | Admitting: *Deleted

## 2016-08-23 ENCOUNTER — Encounter: Payer: Self-pay | Admitting: Internal Medicine

## 2016-08-23 NOTE — Progress Notes (Signed)
Teaching and consent performed for Rubraca at office visit on 08/23/15.  Pt and pt's husband present for teaching. Pt provided with written information on Rubraca.  Discussed the role of mail order pharmacy and delivery of drug. Discussed pt's concerns Teach back process performed with patient. Emotional barriers identified during teaching. Pt expressed that she was overwhelmed at the information that she was given today. She understands that our office will submit her prescription to [facility]. [facility] will be in contact with her prior to shipment. Husband expressed concerns r/t financial drug coverage for Rubraca. He states that "I feel the drug will not be covered because my wife's BRACA mutation was negative." Therapeutic listening provided.

## 2016-08-23 NOTE — Telephone Encounter (Signed)
Spoke with patient. Discussed the role of MyChoice testing for her previous ovarian cancer pathology. She is agreeable to proceed with testing. She is concerned about the cost of his molecular testing and would like benefits to be invested first. I sent a msg to our prior auth team so that benefits can be reviewed.

## 2016-08-27 ENCOUNTER — Telehealth: Payer: Self-pay | Admitting: *Deleted

## 2016-08-27 NOTE — Telephone Encounter (Addendum)
Jarrett Soho at [facility] called requesting results of [facility] testing for Prior Authorization on Ru-Braca medication, requesting a call back or fax sent to [phone]

## 2016-08-29 ENCOUNTER — Other Ambulatory Visit: Payer: Self-pay | Admitting: Internal Medicine

## 2016-08-31 ENCOUNTER — Telehealth: Payer: Self-pay | Admitting: *Deleted

## 2016-08-31 NOTE — Telephone Encounter (Signed)
rubraca denied. Appeal process performed. Received call from  [name] at [employer]. Insurance will keep claim open for rubraca as long as possible to allow ample time for mychoice testing to be resulted. (per [name] at [employer]. - phone [phone]/ fax: [phone] ; pt's claim id # for rubraca [id number].  Claim denied as pt is braca negative.   Pt called cancer center - requesting - update on the Rubraca. Pt upset the Rubraca was denied and wants update on tx plan. I explained that I received a msg from [employer]. Claim - for rubraca denied. Explained tp pt that an appeal was performed.  Explained to patient that md is waiting on mychoice results.  Pt expresses "anxiety about delaying the medication start. I feel like I need to go ahead and start medication."  I explained to patient that I would keep her posted as much as possible regarding the status of her insurance approval.  She thanked me for discussing her care and keeping her updated.  Spoke with Dr. Rogue Bussing. He is aware of the denial and appeal process.

## 2016-09-05 ENCOUNTER — Telehealth: Payer: Self-pay | Admitting: *Deleted

## 2016-09-05 NOTE — Telephone Encounter (Signed)
Patient states that she is "very upset that her Rubraca was denied and would like to know the plan asap. Per [name] at [employer] noon was the deadline for the appeal." She would like our office to call [name] at [phone], who is her Chartered certified accountant at [employer].  I contacted [name] back this morning 09/05/16.  [name] said he held the appeal claim open as long as his guidelines could be held. He explained to the my choice pathology is going to take several weeks to results. The 2nd appeal process will need to be submitted to CMS not [employer]. Cms will need the results and this form would need to marked expedited. I explained to [name] that I will contact the patient back.  I personally spoke with patient. I explained to the patient that it could be several weeks before the my choice results.  I explained to patient that Dr. Santo Held spoke with Dr. Rogue Bussing.  Per Dr. Yevette Edwards, md asked to me inform patient of her options.  Her options at this point is : 1. Wait, watch and observe for symptom management and then treat patient. 2. Wait for the mychoice testing to determine if she is eligible for rubraca. Patient could wait several weeks to get these results and still not be elligible for the drug. She gave verbal understanding of this.  Option 3. Start on chemotherapy-gemzar/avastin. If patient's genetic testing results and it's positive, then rubraca could be used as a another line of therapy later on if pt's disease progresses.  I explained that gyn onc and Dr. B are both in line of any of these options. We would like to give the pt the autonomy to choose. She said she would discuss this with her husband and call our office back with her decision most likely by Friday.  I did explained that Dr. Fransisca Connors would not recommend zejula for her oral chemotherapy option at this time per Dr. Rogue Bussing.  She gave verbal understanding and thanked me for calling her back.

## 2016-09-07 ENCOUNTER — Telehealth: Payer: Self-pay | Admitting: *Deleted

## 2016-09-07 NOTE — Telephone Encounter (Signed)
Patient called- requesting apt to meet with Dr. Bradd Canary on Monday to discuss her plan of care.  She does not want chemotherapy scheduled until she discussed her care with Dr. Jacinto Reap.  An apt was given to patient for 09/10/16 at 2:15pm.

## 2016-09-10 ENCOUNTER — Inpatient Hospital Stay: Payer: PPO

## 2016-09-10 ENCOUNTER — Inpatient Hospital Stay (HOSPITAL_BASED_OUTPATIENT_CLINIC_OR_DEPARTMENT_OTHER): Payer: PPO | Admitting: Internal Medicine

## 2016-09-10 VITALS — BP 137/79 | HR 66 | Temp 97.6°F | Resp 20 | Ht 63.5 in | Wt 183.4 lb

## 2016-09-10 DIAGNOSIS — F419 Anxiety disorder, unspecified: Secondary | ICD-10-CM

## 2016-09-10 DIAGNOSIS — K5730 Diverticulosis of large intestine without perforation or abscess without bleeding: Secondary | ICD-10-CM

## 2016-09-10 DIAGNOSIS — I493 Ventricular premature depolarization: Secondary | ICD-10-CM

## 2016-09-10 DIAGNOSIS — Z853 Personal history of malignant neoplasm of breast: Secondary | ICD-10-CM

## 2016-09-10 DIAGNOSIS — I340 Nonrheumatic mitral (valve) insufficiency: Secondary | ICD-10-CM

## 2016-09-10 DIAGNOSIS — Z79899 Other long term (current) drug therapy: Secondary | ICD-10-CM

## 2016-09-10 DIAGNOSIS — Z9049 Acquired absence of other specified parts of digestive tract: Secondary | ICD-10-CM

## 2016-09-10 DIAGNOSIS — C569 Malignant neoplasm of unspecified ovary: Secondary | ICD-10-CM

## 2016-09-10 DIAGNOSIS — K219 Gastro-esophageal reflux disease without esophagitis: Secondary | ICD-10-CM

## 2016-09-10 DIAGNOSIS — Z23 Encounter for immunization: Secondary | ICD-10-CM

## 2016-09-10 DIAGNOSIS — Z90710 Acquired absence of both cervix and uterus: Secondary | ICD-10-CM | POA: Diagnosis not present

## 2016-09-10 DIAGNOSIS — Z803 Family history of malignant neoplasm of breast: Secondary | ICD-10-CM

## 2016-09-10 DIAGNOSIS — C786 Secondary malignant neoplasm of retroperitoneum and peritoneum: Secondary | ICD-10-CM | POA: Diagnosis not present

## 2016-09-10 DIAGNOSIS — Z90722 Acquired absence of ovaries, bilateral: Secondary | ICD-10-CM

## 2016-09-10 DIAGNOSIS — E785 Hyperlipidemia, unspecified: Secondary | ICD-10-CM

## 2016-09-10 DIAGNOSIS — Z800 Family history of malignant neoplasm of digestive organs: Secondary | ICD-10-CM

## 2016-09-10 DIAGNOSIS — I700 Atherosclerosis of aorta: Secondary | ICD-10-CM

## 2016-09-10 DIAGNOSIS — Z808 Family history of malignant neoplasm of other organs or systems: Secondary | ICD-10-CM

## 2016-09-10 DIAGNOSIS — E039 Hypothyroidism, unspecified: Secondary | ICD-10-CM

## 2016-09-10 DIAGNOSIS — I4891 Unspecified atrial fibrillation: Secondary | ICD-10-CM

## 2016-09-10 MED ORDER — INFLUENZA VAC SPLIT QUAD 0.5 ML IM SUSY
PREFILLED_SYRINGE | INTRAMUSCULAR | Status: AC
  Filled 2016-09-10: qty 0.5

## 2016-09-10 MED ORDER — INFLUENZA VAC SPLIT QUAD 0.5 ML IM SUSY
0.5000 mL | PREFILLED_SYRINGE | Freq: Once | INTRAMUSCULAR | Status: AC
  Administered 2016-09-10: 0.5 mL via INTRAMUSCULAR

## 2016-09-10 NOTE — Progress Notes (Signed)
Patient here for follow-up with Dr. Rogue Bussing to discuss her treatment plan options. Pt reports that she is very anxious about this apt today.

## 2016-09-10 NOTE — Progress Notes (Signed)
[place] OFFICE PROGRESS NOTE  Patient Care Team: Jerrol Banana., MD as PCP - General (Family Medicine) [facility] Gaetana Michaelis, MD as Referring Physician (Obstetrics and Gynecology) Robert Bellow, MD as Consulting Physician (General Surgery)  Ovarian cancer [facility])   Staging form: Ovary, AJCC 7th Edition     Clinical: Stage IIIC (T3c, N0, M0) - Unsigned    Oncology History   # April- MAY 2016- OVARIAN CANCER STAGE IIIC; CA-125 +2300+;  s/p OPTIMAL DEBULKING SURGERY   # MAY 2016[name] [finished in Sep 19th 2017]  # MAY CT 2017- RECURRENT/Peritoneal carcinomatosis/pelvic implant ~1.2cm/Ca-125-34;   # July 3rd- CARBO-DOXIL q 4W [with neulasta]; CT May 11 2016- slight increase peritoneal / stable nodules. Cont chemo [finished DEC 2017]. DEC 28th 2017 CT- Increase in peritoneal deposits; increasing Ca 125 [60s]  # G-1-2 hand foot syndrome-   # LEFT BREAST CA s/p Lumpec & RT s/p TAM   # Afib [cardiology]; JUNE 2017- MUGA 62%  MOLECULAR TESTING- ATM genetic mutation      Ovary cancer, unspecified laterality ([place])   12/16/2014 Initial Diagnosis    Ovarian cancer       INTERVAL HISTORY:  77 year old female patient with above history of ovarian cancer recurrent Platinum Refractory ovarian cancer [based on CT scan in December 2017]- is here for follow-up.  Patient is still awaiting HRD testing. Awaiting approval of Rubraca. Patient is very nervous/ anxious.   Denies any fatigue. Denies any nausea vomiting or abdominal pain. Denies any abdominal distention. No chest pain or shortness of breath or cough. No rash and palms and soles.  REVIEW OF SYSTEMS:  A complete 10 point review of system is done which is negative except mentioned above/history of present illness.   PAST MEDICAL HISTORY :  Past Medical History:  Diagnosis Date  . Atrial fibrillation ([place])   . Cancer of breast ([place]) 1992   Left  . GERD (gastroesophageal reflux disease)   .  Hyperlipidemia   . Hypothyroidism   . Mitral insufficiency   . Ovarian cancer [facility])    s/p BSO optimal tumor debulking May 2016  . PVC (premature ventricular contraction)     PAST SURGICAL HISTORY :   Past Surgical History:  Procedure Laterality Date  . ABDOMINAL HYSTERECTOMY    . BILATERAL SALPINGOOPHORECTOMY  May 2016   with optimal tumor debulking   . BREAST SURGERY     Left  . CESAREAN SECTION     x2  . CHOLECYSTECTOMY    . COLONOSCOPY  2006  . DEBULKING N/A 12/22/2014   Procedure: DEBULKING;  Surgeon: Gillis Ends, MD;  Location: [facility] ORS;  Service: Gynecology;  Laterality: N/A;  . LAPAROTOMY N/A 12/22/2014   Procedure: EXPLORATORY LAPAROTOMY;  Surgeon: Robert Bellow, MD;  Location: [facility] ORS;  Service: General;  Laterality: N/A;  . LAPAROTOMY WITH STAGING N/A 12/22/2014   Procedure: LAPAROTOMY WITH STAGING;  Surgeon: Gillis Ends, MD;  Location: [facility] ORS;  Service: Gynecology;  Laterality: N/A;  . PERIPHERAL VASCULAR CATHETERIZATION Left 12/22/2014   Procedure: PORTA CATH INSERTION;  Surgeon: Robert Bellow, MD;  Location: [facility] ORS;  Service: General;  Laterality: Left;  . PORTACATH PLACEMENT Right 12/22/2014   Procedure: INSERTION PORT-A-CATH;  Surgeon: Robert Bellow, MD;  Location: [facility] ORS;  Service: General;  Laterality: Right;  . SALPINGOOPHORECTOMY Bilateral 12/22/2014   Procedure: SALPINGO OOPHORECTOMY;  Surgeon: Gillis Ends, MD;  Location: [facility] ORS;  Service: Gynecology;  Laterality: Bilateral;  .  UPPER GI ENDOSCOPY  09/25/04   hiatus hernia, schatzki ring and a single gastric polyp    FAMILY HISTORY :   Family History  Problem Relation Age of Onset  . Cancer Mother     breast, throat, and stomach  . Breast cancer Mother   . Heart disease Father   . Pulmonary embolism Sister   . Cancer Brother 60    angiosarcoma of the chest; carcinoid small intestinal tumor  . Hypertension Brother   . Pulmonary embolism Other   . Cancer Other    . Cancer Maternal Aunt     breast cancer  . Cancer Maternal Grandfather 14    pancreatic    SOCIAL HISTORY:   Social History  Substance Use Topics  . Smoking status: Never Smoker  . Smokeless tobacco: Never Used  . Alcohol use No    ALLERGIES:  is allergic to carafate [sucralfate] and sulfa antibiotics.  MEDICATIONS:  Current Outpatient Prescriptions  Medication Sig Dispense Refill  . acetaminophen (TYLENOL) 500 MG chewable tablet Chew 500 mg by mouth every 6 (six) hours as needed for pain.    . chlorhexidine (PERIDEX) 0.12 % solution Use as directed 5 mLs in the mouth or throat as needed.     . Cholecalciferol (VITAMIN D) 2000 units tablet Take 1 tablet (2,000 Units total) by mouth daily. 30 tablet 12  . lactulose (CHRONULAC) 10 GM/15ML solution TAKE (10G TOTAL) BY MOUTH THREE TIMES A DAY AS DIRECTED BY PHYSICIAN. 240 mL 1  . levothyroxine (SYNTHROID, LEVOTHROID) 50 MCG tablet Take 1 tablet (50 mcg total) by mouth daily. 30 tablet 12  . LORazepam (ATIVAN) 0.5 MG tablet Take 1 tablet (0.5 mg total) by mouth at bedtime. (Patient taking differently: Take 0.25 mg by mouth at bedtime. ) 30 tablet 3  . Multiple Vitamin (MULTIVITAMIN) capsule Take 1 capsule by mouth daily.    . pantoprazole (PROTONIX) 40 MG tablet Take 1 tablet (40 mg total) by mouth daily. 30 tablet 12  . polyethylene glycol (MIRALAX / GLYCOLAX) packet Take 17 g by mouth daily. 30 each 4  . propafenone (RYTHMOL) 225 MG tablet Take 225 mg by mouth 2 (two) times daily.    Marland Kitchen lidocaine-prilocaine (EMLA) cream APPLY TO AFFECTED AREA IF NEEDED (Patient not taking: Reported on 09/10/2016) 30 g 1  . ondansetron (ZOFRAN) 4 MG tablet Take 4 mg by mouth every 6 (six) hours as needed for nausea or vomiting. Reported on 02/20/2016    . Rucaparib Camsylate (RUBRACA) 300 MG TABS Take 600 mg by mouth 2 (two) times daily. (Patient not taking: Reported on 09/10/2016) 120 tablet 6   No current facility-administered medications for this  visit.    Facility-Administered Medications Ordered in Other Visits  Medication Dose Route Frequency Provider Last Rate Last Dose  . sodium chloride 0.9 % injection 10 mL  10 mL Intracatheter PRN Johney Maine, MD   10 mL at 02/07/15 0930    PHYSICAL EXAMINATION: ECOG PERFORMANCE STATUS: 0 - Asymptomatic  BP 137/79 (BP Location: Left Arm, Patient Position: Sitting)   Pulse 66   Temp 97.6 F (36.4 C) (Tympanic)   Resp 20   Ht 5' 3.5" (1.613 m)   Wt 183 lb 6.4 oz (83.2 kg)   BMI 31.98 kg/m   Filed Weights   09/10/16 1430  Weight: 183 lb 6.4 oz (83.2 kg)    GENERAL: Well-nourished well-developed; Alert, no distress and comfortAccompanied by her husband. EYES: no pallor or icterus OROPHARYNX: no thrush  or ulceration; good dentition  NECK: supple, no masses felt LYMPH:  no palpable lymphadenopathy in the cervical, axillary or inguinal regions LUNGS: clear to auscultation and  No wheeze or crackles HEART/CVS: regular rate & rhythm and no murmurs; No lower extremity edema ABDOMEN:abdomen soft, non-tender and normal bowel sounds Musculoskeletal:no cyanosis of digits and no clubbing  PSYCH: alert & oriented x 3 with fluent speech NEURO: no focal motor/sensory deficits SKIN:  Normal. No rash noted.   LABORATORY DATA:  I have reviewed the data as listed    Component Value Date/Time   NA 136 08/15/2016 0855   NA 140 12/14/2014 1037   K 4.1 08/15/2016 0855   K 4.4 12/14/2014 1037   CL 101 08/15/2016 0855   CL 106 12/14/2014 1037   CO2 27 08/15/2016 0855   CO2 26 12/14/2014 1037   GLUCOSE 92 08/15/2016 0855   GLUCOSE 87 12/14/2014 1037   BUN 12 08/15/2016 0855   BUN 12 12/14/2014 1037   CREATININE 0.91 08/15/2016 0855   CREATININE 0.97 12/14/2014 1037   CALCIUM 9.3 08/15/2016 0855   CALCIUM 9.2 12/14/2014 1037   PROT 7.1 08/15/2016 0855   PROT 7.3 12/14/2014 1037   ALBUMIN 3.7 08/15/2016 0855   ALBUMIN 3.8 12/14/2014 1037   AST 22 08/15/2016 0855   AST 20 12/14/2014  1037   ALT 15 08/15/2016 0855   ALT 11 (L) 12/14/2014 1037   ALKPHOS 72 08/15/2016 0855   ALKPHOS 138 (H) 12/14/2014 1037   BILITOT 0.6 08/15/2016 0855   BILITOT 0.5 12/14/2014 1037   GFRNONAA >60 08/15/2016 0855   GFRNONAA 59 (L) 12/14/2014 1037   GFRAA >60 08/15/2016 0855   GFRAA >60 12/14/2014 1037    No results found for: SPEP, UPEP  Lab Results  Component Value Date   WBC 3.9 08/15/2016   NEUTROABS 1.4 08/15/2016   HGB 12.8 08/15/2016   HCT 37.0 08/15/2016   MCV 102.5 (H) 08/15/2016   PLT 269 08/15/2016      Chemistry      Component Value Date/Time   NA 136 08/15/2016 0855   NA 140 12/14/2014 1037   K 4.1 08/15/2016 0855   K 4.4 12/14/2014 1037   CL 101 08/15/2016 0855   CL 106 12/14/2014 1037   CO2 27 08/15/2016 0855   CO2 26 12/14/2014 1037   BUN 12 08/15/2016 0855   BUN 12 12/14/2014 1037   CREATININE 0.91 08/15/2016 0855   CREATININE 0.97 12/14/2014 1037   GLU 84 03/16/2014      Component Value Date/Time   CALCIUM 9.3 08/15/2016 0855   CALCIUM 9.2 12/14/2014 1037   ALKPHOS 72 08/15/2016 0855   ALKPHOS 138 (H) 12/14/2014 1037   AST 22 08/15/2016 0855   AST 20 12/14/2014 1037   ALT 15 08/15/2016 0855   ALT 11 (L) 12/14/2014 1037   BILITOT 0.6 08/15/2016 0855   BILITOT 0.5 12/14/2014 1037    IMPRESSION: 1. Today's study demonstrates slight interval growth of several peritoneal implants in the low anatomic pelvis indicative of slight progression of disease. No other new peritoneal deposits are noted, and there is no other metastatic disease noted elsewhere in the chest, abdomen or pelvis. 2. Colonic diverticulosis without evidence of acute diverticulitis at this time. 3. Aortic atherosclerosis, in addition to left main coronary artery disease. Assessment for potential risk factor modification, dietary therapy or pharmacologic therapy may be warranted, if clinically indicated. 4. Additional incidental findings, as above.   Electronically  Signed  By: Vinnie Langton M.D.   On: 08/15/2016 14:08   RADIOGRAPHIC STUDIES: I have personally reviewed the radiological images as listed and agreed with the findings in the report. No results found.   ASSESSMENT & PLAN:  Ovary cancer, unspecified laterality ([place]) Recurrent ovarian cancer Currently s/p carboplatin and Doxil every 4 weeks x6 cycles; slight increase in the CA-125.DEC 2017-  CT scan shows slight increase in the peritoneal deposits in the pelvis. Patient currently has platinum refractory ovarian cancer.  # I had a long discussion the patient- regarding the incurability of the cancer; and the fact that surgery is not recommended this time. Discuss option of chemotherapy with Avastin in the treatment of platinum refractory ovarian cancer. Discussed the multiple chemotherapy partners- including Taxol; topotecan [patient had progressed on Doxil]. Patient concerned about neuropathy with Taxol.  Discussed the potential side effects of Taxol and also of Avastin intruding but not limited to elevated blood pressure,  wound healing issues fistulas and also kidney problems.  # Also discussed the role of use of PARP inhibitors [Zejula or rupacarib] for maintenance/treatment of ovarian cancer. Patient is in ATM- mutated. We will check for HRD- if positive would recommend inhibitor. Discussed the potential side effects of PARP inhibitors.  Script for rubraca was given.   # Discussed with the patient and husband that the treatments are ongoing/indefinite. # Reviewed/counselled regarding the goals of care- being palliative/treatment are usually indefinite-until progression or side effects. Goal is to maintain quality of life as the disease is incurable.  # Discussed with Drs.[employer] and [employer].  # For now patient wants to wait for 2 more weeks for her HRD testing /PARP inhibitor approval. However if not approved we'll proceed with Taxol Avastin as the next line of therapy   # follow up in 2  weeks/ labs/ Taxol avastin/ca-125.   # 40 minutes face-to-face with the patient discussing the above plan of care; more than 50% of time spent on prognosis/ natural history; counseling and coordination.   Orders Placed This Encounter  Procedures  . CBC with Differential/Platelet    Standing Status:   Future    Standing Expiration Date:   03/10/2017  . Comprehensive metabolic panel    Standing Status:   Future    Standing Expiration Date:   03/10/2017  . CA 125    Standing Status:   Future    Standing Expiration Date:   03/10/2017   All questions were answered. The patient knows to call the clinic with any problems, questions or concerns.   Addendum:  HRD testing on the tumor was ordred on Jan 4th.    Cammie Sickle, MD 09/11/2016 11:09 AM

## 2016-09-10 NOTE — Assessment & Plan Note (Addendum)
Recurrent ovarian cancer Currently s/p carboplatin and Doxil every 4 weeks x6 cycles; slight increase in the CA-125.DEC 2017-  CT scan shows slight increase in the peritoneal deposits in the pelvis. Patient currently has platinum refractory ovarian cancer.  # I had a long discussion the patient- regarding the incurability of the cancer; and the fact that surgery is not recommended this time. Discuss option of chemotherapy with Avastin in the treatment of platinum refractory ovarian cancer. Discussed the multiple chemotherapy partners- including Taxol; topotecan [patient had progressed on Doxil]. Patient concerned about neuropathy with Taxol.  Discussed the potential side effects of Taxol and also of Avastin intruding but not limited to elevated blood pressure,  wound healing issues fistulas and also kidney problems.  # Also discussed the role of use of PARP inhibitors [Zejula or rupacarib] for maintenance/treatment of ovarian cancer. Patient is in ATM- mutated. We will check for HRD- if positive would recommend inhibitor. Discussed the potential side effects of PARP inhibitors.  Script for rubraca was given.   # Discussed with the patient and husband that the treatments are ongoing/indefinite. # Reviewed/counselled regarding the goals of care- being palliative/treatment are usually indefinite-until progression or side effects. Goal is to maintain quality of life as the disease is incurable.  # Discussed with Drs.[employer] and [employer].  # For now patient wants to wait for 2 more weeks for her HRD testing /PARP inhibitor approval. However if not approved we'll proceed with Taxol Avastin as the next line of therapy   # follow up in 2 weeks/ labs/ Taxol avastin/ca-125.   # 40 minutes face-to-face with the patient discussing the above plan of care; more than 50% of time spent on prognosis/ natural history; counseling and coordination.

## 2016-09-11 ENCOUNTER — Other Ambulatory Visit: Payer: Self-pay | Admitting: *Deleted

## 2016-09-11 ENCOUNTER — Telehealth: Payer: Self-pay | Admitting: Internal Medicine

## 2016-09-11 DIAGNOSIS — C569 Malignant neoplasm of unspecified ovary: Secondary | ICD-10-CM

## 2016-09-11 NOTE — Progress Notes (Signed)
START ON PATHWAY REGIMEN - Ovarian  [id number]: Paclitaxel 80 mg/m2 Weekly + Bevacizumab 10 mg/kg q2 Weeks, q28 Days; Re-evaluate Every 3 Cycles, Treat until Complete Response, Unacceptable Toxicity, or Disease Progression   A cycle is every 28 days:     Paclitaxel (Taxol(R)) 80 mg/m2 in 250 mL NS IV over 1 hour days 1, 8, 15, and 22 every 28 days Dose Mod: None     Bevacizumab (Avastin(R)) 10 mg/kg in 100 mL NS IV every 2 weeks.  Administer first dose over 90 minutes if tolerated second dose over 60 minutes and if tolerated all subsequent doses over 30 minutes.  Check with insurer prior to initiating  bevacizumab. Dose Mod: None  **Always confirm dose/schedule in your pharmacy ordering system**    Patient Characteristics: Third Line Therapy, Platinum Resistant or < 6 Months Since Last Platinum Therapy AJCC T Category: T3a AJCC N Category: NX AJCC M Category: M1 AJCC 8 Stage Grouping: IV Line of Therapy: Third Line Therapy BRCA Mutation Status: Awaiting Test Results Would you be surprised if this patient died  in the next year? I would be surprised if this patient died in the next year  Intent of Therapy: Non-Curative / Palliative Intent, Discussed with Patient

## 2016-09-11 NOTE — Telephone Encounter (Signed)
Please order UA for avastin at next visit. Thx

## 2016-09-11 NOTE — Telephone Encounter (Signed)
Lab added

## 2016-09-19 ENCOUNTER — Inpatient Hospital Stay: Payer: PPO | Admitting: Internal Medicine

## 2016-09-19 ENCOUNTER — Other Ambulatory Visit: Payer: PPO

## 2016-09-19 ENCOUNTER — Telehealth: Payer: Self-pay | Admitting: *Deleted

## 2016-09-19 NOTE — Telephone Encounter (Signed)
Patient stated that health team adv. has not called her to let her know that that her chemotherapy has been approved. I reviewed chart, and reassured patient that her chemotherapy was authorized by her insurance.

## 2016-09-24 ENCOUNTER — Inpatient Hospital Stay (HOSPITAL_BASED_OUTPATIENT_CLINIC_OR_DEPARTMENT_OTHER): Payer: PPO | Admitting: Internal Medicine

## 2016-09-24 ENCOUNTER — Inpatient Hospital Stay: Payer: PPO | Attending: Internal Medicine

## 2016-09-24 ENCOUNTER — Inpatient Hospital Stay: Payer: PPO

## 2016-09-24 VITALS — BP 144/81 | HR 73

## 2016-09-24 VITALS — BP 110/68 | HR 84 | Temp 96.3°F | Wt 186.0 lb

## 2016-09-24 DIAGNOSIS — R971 Elevated cancer antigen 125 [CA 125]: Secondary | ICD-10-CM

## 2016-09-24 DIAGNOSIS — E785 Hyperlipidemia, unspecified: Secondary | ICD-10-CM

## 2016-09-24 DIAGNOSIS — Z79899 Other long term (current) drug therapy: Secondary | ICD-10-CM

## 2016-09-24 DIAGNOSIS — E039 Hypothyroidism, unspecified: Secondary | ICD-10-CM

## 2016-09-24 DIAGNOSIS — I700 Atherosclerosis of aorta: Secondary | ICD-10-CM | POA: Diagnosis not present

## 2016-09-24 DIAGNOSIS — Z90722 Acquired absence of ovaries, bilateral: Secondary | ICD-10-CM | POA: Insufficient documentation

## 2016-09-24 DIAGNOSIS — I4891 Unspecified atrial fibrillation: Secondary | ICD-10-CM | POA: Insufficient documentation

## 2016-09-24 DIAGNOSIS — Z803 Family history of malignant neoplasm of breast: Secondary | ICD-10-CM | POA: Insufficient documentation

## 2016-09-24 DIAGNOSIS — Z5112 Encounter for antineoplastic immunotherapy: Secondary | ICD-10-CM | POA: Diagnosis not present

## 2016-09-24 DIAGNOSIS — I340 Nonrheumatic mitral (valve) insufficiency: Secondary | ICD-10-CM | POA: Insufficient documentation

## 2016-09-24 DIAGNOSIS — Z800 Family history of malignant neoplasm of digestive organs: Secondary | ICD-10-CM

## 2016-09-24 DIAGNOSIS — K219 Gastro-esophageal reflux disease without esophagitis: Secondary | ICD-10-CM

## 2016-09-24 DIAGNOSIS — F419 Anxiety disorder, unspecified: Secondary | ICD-10-CM | POA: Diagnosis not present

## 2016-09-24 DIAGNOSIS — Z5111 Encounter for antineoplastic chemotherapy: Secondary | ICD-10-CM | POA: Insufficient documentation

## 2016-09-24 DIAGNOSIS — Z90710 Acquired absence of both cervix and uterus: Secondary | ICD-10-CM | POA: Insufficient documentation

## 2016-09-24 DIAGNOSIS — I10 Essential (primary) hypertension: Secondary | ICD-10-CM | POA: Diagnosis not present

## 2016-09-24 DIAGNOSIS — I480 Paroxysmal atrial fibrillation: Secondary | ICD-10-CM | POA: Diagnosis not present

## 2016-09-24 DIAGNOSIS — C786 Secondary malignant neoplasm of retroperitoneum and peritoneum: Secondary | ICD-10-CM

## 2016-09-24 DIAGNOSIS — I493 Ventricular premature depolarization: Secondary | ICD-10-CM

## 2016-09-24 DIAGNOSIS — K5730 Diverticulosis of large intestine without perforation or abscess without bleeding: Secondary | ICD-10-CM | POA: Insufficient documentation

## 2016-09-24 DIAGNOSIS — K279 Peptic ulcer, site unspecified, unspecified as acute or chronic, without hemorrhage or perforation: Secondary | ICD-10-CM | POA: Insufficient documentation

## 2016-09-24 DIAGNOSIS — Z853 Personal history of malignant neoplasm of breast: Secondary | ICD-10-CM | POA: Diagnosis not present

## 2016-09-24 DIAGNOSIS — L271 Localized skin eruption due to drugs and medicaments taken internally: Secondary | ICD-10-CM | POA: Diagnosis not present

## 2016-09-24 DIAGNOSIS — C569 Malignant neoplasm of unspecified ovary: Secondary | ICD-10-CM | POA: Diagnosis not present

## 2016-09-24 DIAGNOSIS — M5137 Other intervertebral disc degeneration, lumbosacral region: Secondary | ICD-10-CM | POA: Insufficient documentation

## 2016-09-24 DIAGNOSIS — R110 Nausea: Secondary | ICD-10-CM | POA: Diagnosis not present

## 2016-09-24 DIAGNOSIS — Z808 Family history of malignant neoplasm of other organs or systems: Secondary | ICD-10-CM | POA: Insufficient documentation

## 2016-09-24 LAB — CBC WITH DIFFERENTIAL/PLATELET
Basophils Absolute: 0 10*3/uL (ref 0–0.1)
Basophils Relative: 1 %
Eosinophils Absolute: 0.1 10*3/uL (ref 0–0.7)
Eosinophils Relative: 2 %
HEMATOCRIT: 38 % (ref 35.0–47.0)
HEMOGLOBIN: 13.2 g/dL (ref 12.0–16.0)
LYMPHS ABS: 2.1 10*3/uL (ref 1.0–3.6)
Lymphocytes Relative: 41 %
MCH: 34.8 pg — ABNORMAL HIGH (ref 26.0–34.0)
MCHC: 34.6 g/dL (ref 32.0–36.0)
MCV: 100.5 fL — AB (ref 80.0–100.0)
MONOS PCT: 10 %
Monocytes Absolute: 0.5 10*3/uL (ref 0.2–0.9)
Neutro Abs: 2.4 10*3/uL (ref 1.4–6.5)
Neutrophils Relative %: 46 %
Platelets: 238 10*3/uL (ref 150–440)
RBC: 3.78 MIL/uL — AB (ref 3.80–5.20)
RDW: 13.7 % (ref 11.5–14.5)
WBC: 5.2 10*3/uL (ref 3.6–11.0)

## 2016-09-24 LAB — COMPREHENSIVE METABOLIC PANEL
ALBUMIN: 3.7 g/dL (ref 3.5–5.0)
ALT: 16 U/L (ref 14–54)
AST: 26 U/L (ref 15–41)
Alkaline Phosphatase: 74 U/L (ref 38–126)
Anion gap: 6 (ref 5–15)
BILIRUBIN TOTAL: 0.5 mg/dL (ref 0.3–1.2)
BUN: 14 mg/dL (ref 6–20)
CO2: 26 mmol/L (ref 22–32)
CREATININE: 0.9 mg/dL (ref 0.44–1.00)
Calcium: 9.3 mg/dL (ref 8.9–10.3)
Chloride: 107 mmol/L (ref 101–111)
GFR calc Af Amer: >60 mL/min (ref >60)
GLUCOSE: 101 mg/dL — AB (ref 65–99)
Potassium: 3.7 mmol/L (ref 3.5–5.1)
Sodium: 139 mmol/L (ref 135–145)
TOTAL PROTEIN: 7.1 g/dL (ref 6.5–8.1)

## 2016-09-24 LAB — URINALYSIS, COMPLETE (UACMP) WITH MICROSCOPIC
Bacteria, UA: NONE SEEN
Bilirubin Urine: NEGATIVE
GLUCOSE, UA: NEGATIVE mg/dL
HGB URINE DIPSTICK: NEGATIVE
Ketones, ur: NEGATIVE mg/dL
Leukocytes, UA: NEGATIVE
Nitrite: NEGATIVE
PROTEIN: NEGATIVE mg/dL
SPECIFIC GRAVITY, URINE: 1.005 (ref 1.005–1.030)
pH: 6 (ref 5.0–8.0)

## 2016-09-24 MED ORDER — DIPHENHYDRAMINE HCL 50 MG/ML IJ SOLN
50.0000 mg | Freq: Once | INTRAMUSCULAR | Status: AC
  Administered 2016-09-24: 50 mg via INTRAVENOUS
  Filled 2016-09-24: qty 1

## 2016-09-24 MED ORDER — SODIUM CHLORIDE 0.9 % IV SOLN
800.0000 mg | Freq: Once | INTRAVENOUS | Status: AC
  Administered 2016-09-24: 800 mg via INTRAVENOUS
  Filled 2016-09-24: qty 32

## 2016-09-24 MED ORDER — DEXAMETHASONE SODIUM PHOSPHATE 100 MG/10ML IJ SOLN
20.0000 mg | Freq: Once | INTRAMUSCULAR | Status: AC
  Administered 2016-09-24: 20 mg via INTRAVENOUS
  Filled 2016-09-24: qty 2

## 2016-09-24 MED ORDER — SODIUM CHLORIDE 0.9% FLUSH
10.0000 mL | INTRAVENOUS | Status: DC | PRN
  Administered 2016-09-24: 10 mL
  Filled 2016-09-24: qty 10

## 2016-09-24 MED ORDER — SODIUM CHLORIDE 0.9 % IV SOLN
Freq: Once | INTRAVENOUS | Status: AC
  Administered 2016-09-24: 11:00:00 via INTRAVENOUS
  Filled 2016-09-24: qty 1000

## 2016-09-24 MED ORDER — FAMOTIDINE IN NACL 20-0.9 MG/50ML-% IV SOLN
20.0000 mg | Freq: Once | INTRAVENOUS | Status: AC
  Administered 2016-09-24: 20 mg via INTRAVENOUS
  Filled 2016-09-24: qty 50

## 2016-09-24 MED ORDER — LIDOCAINE-PRILOCAINE 2.5-2.5 % EX CREA: TOPICAL_CREAM | CUTANEOUS | 5 refills | Status: DC

## 2016-09-24 MED ORDER — DEXTROSE 5 % IV SOLN
80.0000 mg/m2 | Freq: Once | INTRAVENOUS | Status: AC
  Administered 2016-09-24: 156 mg via INTRAVENOUS
  Filled 2016-09-24: qty 26

## 2016-09-24 MED ORDER — HEPARIN SOD (PORK) LOCK FLUSH 100 UNIT/ML IV SOLN
500.0000 [IU] | Freq: Once | INTRAVENOUS | Status: AC | PRN
  Administered 2016-09-24: 500 [IU]
  Filled 2016-09-24: qty 5

## 2016-09-24 MED ORDER — LORAZEPAM 0.5 MG PO TABS: 0.2500 mg | ORAL_TABLET | Freq: Every day | ORAL | 0 refills | Status: DC

## 2016-09-24 NOTE — Progress Notes (Signed)
[place] OFFICE PROGRESS NOTE  Patient Care Team: Jerrol Banana., MD as PCP - General (Family Medicine) Navos Gaetana Michaelis, MD as Referring Physician (Obstetrics and Gynecology) Robert Bellow, MD as Consulting Physician (General Surgery)  Ovarian cancer [facility])   Staging form: Ovary, AJCC 7th Edition     Clinical: Stage IIIC (T3c, N0, M0) - Unsigned    Oncology History   # April- MAY 2016- OVARIAN CANCER STAGE IIIC; CA-125 +2300+;  s/p OPTIMAL DEBULKING SURGERY   # MAY 2016[name] [finished in Sep 19th 2017]  # MAY CT 2017- RECURRENT/Peritoneal carcinomatosis/pelvic implant ~1.2cm/Ca-125-34;   # July 3rd- CARBO-DOXIL q 4W [with neulasta]; CT May 11 2016- slight increase peritoneal / stable nodules. Cont chemo [finished DEC 2017]. DEC 28th 2017 CT- Increase in peritoneal deposits; increasing Ca 125 [60s]  # G-1-2 hand foot syndrome-   # LEFT BREAST CA s/p Lumpec & RT s/p TAM   # Afib [cardiology]; JUNE 2017- MUGA 62%  MOLECULAR TESTING- ATM genetic mutation      Ovary cancer, unspecified laterality ([place])   12/16/2014 Initial Diagnosis    Ovarian cancer       INTERVAL HISTORY:  76 year old female patient with above history of ovarian cancer recurrent Platinum Refractory ovarian cancer [based on CT scan in December 2017]- is here for follow-up.  Patient is still awaiting HRD testing. Awaiting approval of Rubraca. Patient is very nervous/ anxious.   Denies any fatigue. Denies any nausea vomiting or abdominal pain. Denies any abdominal distention. No chest pain or shortness of breath or cough. No rash and palms and soles.  REVIEW OF SYSTEMS:  A complete 10 point review of system is done which is negative except mentioned above/history of present illness.   PAST MEDICAL HISTORY :  Past Medical History:  Diagnosis Date  . Atrial fibrillation ([place])   . Cancer of breast ([place]) 1992   Left  . GERD (gastroesophageal reflux disease)   .  Hyperlipidemia   . Hypothyroidism   . Mitral insufficiency   . Ovarian cancer [facility])    s/p BSO optimal tumor debulking May 2016  . PVC (premature ventricular contraction)     PAST SURGICAL HISTORY :   Past Surgical History:  Procedure Laterality Date  . ABDOMINAL HYSTERECTOMY    . BILATERAL SALPINGOOPHORECTOMY  May 2016   with optimal tumor debulking   . BREAST SURGERY     Left  . CESAREAN SECTION     x2  . CHOLECYSTECTOMY    . COLONOSCOPY  2006  . DEBULKING N/A 12/22/2014   Procedure: DEBULKING;  Surgeon: Gillis Ends, MD;  Location: [facility] ORS;  Service: Gynecology;  Laterality: N/A;  . LAPAROTOMY N/A 12/22/2014   Procedure: EXPLORATORY LAPAROTOMY;  Surgeon: Robert Bellow, MD;  Location: [facility] ORS;  Service: General;  Laterality: N/A;  . LAPAROTOMY WITH STAGING N/A 12/22/2014   Procedure: LAPAROTOMY WITH STAGING;  Surgeon: Gillis Ends, MD;  Location: [facility] ORS;  Service: Gynecology;  Laterality: N/A;  . PERIPHERAL VASCULAR CATHETERIZATION Left 12/22/2014   Procedure: PORTA CATH INSERTION;  Surgeon: Robert Bellow, MD;  Location: [facility] ORS;  Service: General;  Laterality: Left;  . PORTACATH PLACEMENT Right 12/22/2014   Procedure: INSERTION PORT-A-CATH;  Surgeon: Robert Bellow, MD;  Location: [facility] ORS;  Service: General;  Laterality: Right;  . SALPINGOOPHORECTOMY Bilateral 12/22/2014   Procedure: SALPINGO OOPHORECTOMY;  Surgeon: Gillis Ends, MD;  Location: [facility] ORS;  Service: Gynecology;  Laterality: Bilateral;  .  UPPER GI ENDOSCOPY  09/25/04   hiatus hernia, schatzki ring and a single gastric polyp    FAMILY HISTORY :   Family History  Problem Relation Age of Onset  . Cancer Mother     breast, throat, and stomach  . Breast cancer Mother   . Heart disease Father   . Pulmonary embolism Sister   . Cancer Brother 60    angiosarcoma of the chest; carcinoid small intestinal tumor  . Hypertension Brother   . Pulmonary embolism Other   . Cancer Other    . Cancer Maternal Aunt     breast cancer  . Cancer Maternal Grandfather 7    pancreatic    SOCIAL HISTORY:   Social History  Substance Use Topics  . Smoking status: Never Smoker  . Smokeless tobacco: Never Used  . Alcohol use No    ALLERGIES:  is allergic to carafate [sucralfate] and sulfa antibiotics.  MEDICATIONS:  Current Outpatient Prescriptions  Medication Sig Dispense Refill  . acetaminophen (TYLENOL) 500 MG chewable tablet Chew 500 mg by mouth every 6 (six) hours as needed for pain.    . chlorhexidine (PERIDEX) 0.12 % solution Use as directed 5 mLs in the mouth or throat as needed.     . Cholecalciferol (VITAMIN D) 2000 units tablet Take 1 tablet (2,000 Units total) by mouth daily. 30 tablet 12  . levothyroxine (SYNTHROID, LEVOTHROID) 50 MCG tablet Take 1 tablet (50 mcg total) by mouth daily. 30 tablet 12  . lidocaine-prilocaine (EMLA) cream Apply to port area 30-45 mins prior to chemo. 30 g 5  . LORazepam (ATIVAN) 0.5 MG tablet Take 0.5 tablets (0.25 mg total) by mouth at bedtime. 30 tablet 0  . Multiple Vitamin (MULTIVITAMIN) capsule Take 1 capsule by mouth daily.    . ondansetron (ZOFRAN) 4 MG tablet Take 4 mg by mouth every 6 (six) hours as needed for nausea or vomiting. Reported on 02/20/2016    . pantoprazole (PROTONIX) 40 MG tablet Take 1 tablet (40 mg total) by mouth daily. 30 tablet 12  . polyethylene glycol (MIRALAX / GLYCOLAX) packet Take 17 g by mouth daily. 30 each 4  . propafenone (RYTHMOL) 225 MG tablet Take 225 mg by mouth 2 (two) times daily.    Marland Kitchen lactulose (CHRONULAC) 10 GM/15ML solution TAKE 15MLS (10G TOTAL) BY MOUTH THREE TIMES A DAY AS DIRECTED BY PHYSICIAN. (Patient not taking: Reported on 09/24/2016) 240 mL 1   No current facility-administered medications for this visit.    Facility-Administered Medications Ordered in Other Visits  Medication Dose Route Frequency Provider Last Rate Last Dose  . sodium chloride 0.9 % injection 10 mL  10 mL  Intracatheter PRN Forest Gleason, MD   10 mL at 02/07/15 0930    PHYSICAL EXAMINATION: ECOG PERFORMANCE STATUS: 0 - Asymptomatic  BP 110/68 (BP Location: Right Arm, Patient Position: Sitting)   Pulse 84   Temp (!) 96.3 F (35.7 C) (Tympanic)   Wt 186 lb (84.4 kg)   BMI 32.43 kg/m   Filed Weights   09/24/16 0928  Weight: 186 lb (84.4 kg)    GENERAL: Well-nourished well-developed; Alert, no distress and comfortAccompanied by her husband. EYES: no pallor or icterus OROPHARYNX: no thrush or ulceration; good dentition  NECK: supple, no masses felt LYMPH:  no palpable lymphadenopathy in the cervical, axillary or inguinal regions LUNGS: clear to auscultation and  No wheeze or crackles HEART/CVS: regular rate & rhythm and no murmurs; No lower extremity edema ABDOMEN:abdomen soft, non-tender and  normal bowel sounds Musculoskeletal:no cyanosis of digits and no clubbing  PSYCH: alert & oriented x 3 with fluent speech NEURO: no focal motor/sensory deficits SKIN:  Normal. No rash noted.   LABORATORY DATA:  I have reviewed the data as listed    Component Value Date/Time   NA 139 09/24/2016 0903   NA 140 12/14/2014 1037   K 3.7 09/24/2016 0903   K 4.4 12/14/2014 1037   CL 107 09/24/2016 0903   CL 106 12/14/2014 1037   CO2 26 09/24/2016 0903   CO2 26 12/14/2014 1037   GLUCOSE 101 (H) 09/24/2016 0903   GLUCOSE 87 12/14/2014 1037   BUN 14 09/24/2016 0903   BUN 12 12/14/2014 1037   CREATININE 0.90 09/24/2016 0903   CREATININE 0.97 12/14/2014 1037   CALCIUM 9.3 09/24/2016 0903   CALCIUM 9.2 12/14/2014 1037   PROT 7.1 09/24/2016 0903   PROT 7.3 12/14/2014 1037   ALBUMIN 3.7 09/24/2016 0903   ALBUMIN 3.8 12/14/2014 1037   AST 26 09/24/2016 0903   AST 20 12/14/2014 1037   ALT 16 09/24/2016 0903   ALT 11 (L) 12/14/2014 1037   ALKPHOS 74 09/24/2016 0903   ALKPHOS 138 (H) 12/14/2014 1037   BILITOT 0.5 09/24/2016 0903   BILITOT 0.5 12/14/2014 1037   GFRNONAA >60 09/24/2016 0903    GFRNONAA 59 (L) 12/14/2014 1037   GFRAA >60 09/24/2016 0903   GFRAA >60 12/14/2014 1037    No results found for: SPEP, UPEP  Lab Results  Component Value Date   WBC 5.2 09/24/2016   NEUTROABS 2.4 09/24/2016   HGB 13.2 09/24/2016   HCT 38.0 09/24/2016   MCV 100.5 (H) 09/24/2016   PLT 238 09/24/2016      Chemistry      Component Value Date/Time   NA 139 09/24/2016 0903   NA 140 12/14/2014 1037   K 3.7 09/24/2016 0903   K 4.4 12/14/2014 1037   CL 107 09/24/2016 0903   CL 106 12/14/2014 1037   CO2 26 09/24/2016 0903   CO2 26 12/14/2014 1037   BUN 14 09/24/2016 0903   BUN 12 12/14/2014 1037   CREATININE 0.90 09/24/2016 0903   CREATININE 0.97 12/14/2014 1037   GLU 84 03/16/2014      Component Value Date/Time   CALCIUM 9.3 09/24/2016 0903   CALCIUM 9.2 12/14/2014 1037   ALKPHOS 74 09/24/2016 0903   ALKPHOS 138 (H) 12/14/2014 1037   AST 26 09/24/2016 0903   AST 20 12/14/2014 1037   ALT 16 09/24/2016 0903   ALT 11 (L) 12/14/2014 1037   BILITOT 0.5 09/24/2016 0903   BILITOT 0.5 12/14/2014 1037    IMPRESSION: 1. Today's study demonstrates slight interval growth of several peritoneal implants in the low anatomic pelvis indicative of slight progression of disease. No other new peritoneal deposits are noted, and there is no other metastatic disease noted elsewhere in the chest, abdomen or pelvis. 2. Colonic diverticulosis without evidence of acute diverticulitis at this time. 3. Aortic atherosclerosis, in addition to left main coronary artery disease. Assessment for potential risk factor modification, dietary therapy or pharmacologic therapy may be warranted, if clinically indicated. 4. Additional incidental findings, as above.   Electronically Signed   By: Trudie Reed M.D.   On: 08/15/2016 14:08   RADIOGRAPHIC STUDIES: I have personally reviewed the radiological images as listed and agreed with the findings in the report. No results found.   ASSESSMENT  & PLAN:  Ovary cancer, unspecified laterality ([facility]) Recurrent  Platinum refractory ovarian cancer [dec 2017]slight increase in the CA-125.DEC 2017-  CT scan shows slight increase in the peritoneal deposits in the pelvis.   # Proceed with Taxol day1,8 & 15 every 28 days [off-day 22]; Avastin every 2 weeks.  # Again today reiterated- the potential side effects of Avastin including but not limited to stroke blood clots; kidney problems, GI perforations.   # Also discussed the role of use of PARP inhibitors [Zejula or rupacarib] for maintenance/treatment of ovarian cancer. Patient is in ATM- mutated. Still awaiting on HRD testing.   # follow up in 2 weeks/ labs/ Taxol-Avastin.   Orders Placed This Encounter  Procedures  . CBC with Differential/Platelet    Standing Status:   Future    Standing Expiration Date:   03/24/2017  . Comprehensive metabolic panel    Standing Status:   Future    Standing Expiration Date:   03/24/2017  . Urinalysis, Complete w Microscopic    Standing Status:   Future    Standing Expiration Date:   03/24/2017  . CBC with Differential/Platelet    Standing Status:   Future    Standing Expiration Date:   09/24/2017  . Comprehensive metabolic panel    Standing Status:   Future    Standing Expiration Date:   09/24/2017   All questions were answered. The patient knows to call the clinic with any problems, questions or concerns.   Addendum:  HRD testing on the tumor was ordred on Jan 4th.    Cammie Sickle, MD 09/25/2016 8:49 AM

## 2016-09-24 NOTE — Progress Notes (Signed)
Patient here today for follow up.  Patient states no new concerns today  

## 2016-09-24 NOTE — Assessment & Plan Note (Addendum)
Recurrent  Platinum refractory ovarian cancer [dec 2017]slight increase in the CA-125.DEC 2017-  CT scan shows slight increase in the peritoneal deposits in the pelvis.   # Proceed with Taxol day1,8 & 15 every 28 days [off-day 22]; Avastin every 2 weeks.  # Again today reiterated- the potential side effects of Avastin including but not limited to stroke blood clots; kidney problems, GI perforations.   # Also discussed the role of use of PARP inhibitors [Zejula or rupacarib] for maintenance/treatment of ovarian cancer. Patient is in ATM- mutated. Still awaiting on HRD testing.   # follow up in 2 weeks/ labs/ Taxol-Avastin.

## 2016-09-25 LAB — CA 125: CA 125: 101.7 U/mL — AB (ref 0.0–38.1)

## 2016-09-27 ENCOUNTER — Telehealth: Payer: Self-pay | Admitting: *Deleted

## 2016-09-27 ENCOUNTER — Other Ambulatory Visit: Payer: Self-pay | Admitting: Internal Medicine

## 2016-09-27 MED ORDER — TRAMADOL HCL 50 MG PO TABS: 50.0000 mg | ORAL_TABLET | Freq: Four times a day (QID) | ORAL | 0 refills | Status: DC | PRN

## 2016-09-27 MED ORDER — PROCHLORPERAZINE MALEATE 10 MG PO TABS: 10.0000 mg | ORAL_TABLET | Freq: Four times a day (QID) | ORAL | 0 refills | Status: DC | PRN

## 2016-09-27 NOTE — Telephone Encounter (Signed)
Patient called back and stated she is feeling a little better, she is sipping on water, nausea better with prochlorperazine; has not gotten out of bed yet. States she still has the headache, but her vision has improved. I advised if she gets worse or no better to go to ER. She acknowledged understanding and thanked me for checking on her

## 2016-09-27 NOTE — Telephone Encounter (Signed)
Called to report that she is having severe nausea without vomiting since 1 AM this morning. She has taken 2 Ondansetron tablets without relief. She also reports having a bad headache. She had chemotherapy on Monday. Asking what can be done for her. Please advise

## 2016-09-27 NOTE — Telephone Encounter (Signed)
Per Dr Rogue Bussing, see if she has any vision issues or redness of the eye. Can call in Tramadol 50 mg every 6-8 hours # 30.  Patient does report slight blurred vision bilaterally, but more in left than right with some matting in the corner of her lreft eye. She does not want to go to ER, feels it is not that bad at this point in time. I advised her to take nausea med, wait 30 minutes then take her Lactulose and Tramadol and to call me back at 130 to let me know how she is doing. She was in agreement with this plan

## 2016-09-27 NOTE — Telephone Encounter (Signed)
Per VO Dr Rogue Bussing, Have her check her BP or come here for b/p check and send rx for compazine 10 mg every 6 - 8 hours as needed.  I called patient with these instructions and she has a b/p machine at home and checked her b/p while on the phone and it is 149/77. She took a extra strength tylenol last night for her headache and did not get any relief, states she is not sleeping due to the pain in her head it seems to be concentrated at her left eye, goes down left neck and shoulder. Denies pain in jaw or arm. She reports her last BM was day before yesterday, has been taking Miralax, has lactulose on hand, but afraid to use it due to nausea. I advised she take nausea med, wait 30 minutes then take lactulose.

## 2016-10-01 ENCOUNTER — Inpatient Hospital Stay: Payer: PPO

## 2016-10-01 VITALS — BP 134/61 | HR 64 | Temp 97.3°F

## 2016-10-01 DIAGNOSIS — C569 Malignant neoplasm of unspecified ovary: Secondary | ICD-10-CM

## 2016-10-01 DIAGNOSIS — Z5111 Encounter for antineoplastic chemotherapy: Secondary | ICD-10-CM | POA: Diagnosis not present

## 2016-10-01 LAB — COMPREHENSIVE METABOLIC PANEL
ALBUMIN: 3.4 g/dL — AB (ref 3.5–5.0)
ALK PHOS: 66 U/L (ref 38–126)
ALT: 15 U/L (ref 14–54)
ANION GAP: 4 — AB (ref 5–15)
AST: 21 U/L (ref 15–41)
BUN: 14 mg/dL (ref 6–20)
CALCIUM: 9 mg/dL (ref 8.9–10.3)
CO2: 26 mmol/L (ref 22–32)
Chloride: 106 mmol/L (ref 101–111)
Creatinine, Ser: 0.84 mg/dL (ref 0.44–1.00)
GFR calc Af Amer: >60 mL/min (ref >60)
GLUCOSE: 96 mg/dL (ref 65–99)
Potassium: 4 mmol/L (ref 3.5–5.1)
Sodium: 136 mmol/L (ref 135–145)
TOTAL PROTEIN: 6.8 g/dL (ref 6.5–8.1)
Total Bilirubin: 0.6 mg/dL (ref 0.3–1.2)

## 2016-10-01 LAB — CBC WITH DIFFERENTIAL/PLATELET
BASOS PCT: 1 %
Basophils Absolute: 0 10*3/uL (ref 0–0.1)
Eosinophils Absolute: 0.1 10*3/uL (ref 0–0.7)
Eosinophils Relative: 2 %
HEMATOCRIT: 35.7 % (ref 35.0–47.0)
HEMOGLOBIN: 12.4 g/dL (ref 12.0–16.0)
LYMPHS ABS: 2.3 10*3/uL (ref 1.0–3.6)
LYMPHS PCT: 57 %
MCH: 34.6 pg — AB (ref 26.0–34.0)
MCHC: 34.8 g/dL (ref 32.0–36.0)
MCV: 99.6 fL (ref 80.0–100.0)
MONOS PCT: 7 %
Monocytes Absolute: 0.3 10*3/uL (ref 0.2–0.9)
NEUTROS ABS: 1.4 10*3/uL (ref 1.4–6.5)
NEUTROS PCT: 33 %
Platelets: 202 10*3/uL (ref 150–440)
RBC: 3.59 MIL/uL — ABNORMAL LOW (ref 3.80–5.20)
RDW: 13.5 % (ref 11.5–14.5)
WBC: 4.1 10*3/uL (ref 3.6–11.0)

## 2016-10-01 MED ORDER — PACLITAXEL CHEMO INJECTION 300 MG/50ML
80.0000 mg/m2 | Freq: Once | INTRAVENOUS | Status: AC
  Administered 2016-10-01: 156 mg via INTRAVENOUS
  Filled 2016-10-01: qty 26

## 2016-10-01 MED ORDER — SODIUM CHLORIDE 0.9% FLUSH
10.0000 mL | INTRAVENOUS | Status: DC | PRN
  Administered 2016-10-01: 10 mL
  Filled 2016-10-01: qty 10

## 2016-10-01 MED ORDER — SODIUM CHLORIDE 0.9 % IV SOLN
Freq: Once | INTRAVENOUS | Status: AC
  Administered 2016-10-01: 11:00:00 via INTRAVENOUS
  Filled 2016-10-01: qty 1000

## 2016-10-01 MED ORDER — FAMOTIDINE IN NACL 20-0.9 MG/50ML-% IV SOLN
20.0000 mg | Freq: Once | INTRAVENOUS | Status: AC
  Administered 2016-10-01: 20 mg via INTRAVENOUS
  Filled 2016-10-01: qty 50

## 2016-10-01 MED ORDER — SODIUM CHLORIDE 0.9 % IV SOLN
20.0000 mg | Freq: Once | INTRAVENOUS | Status: AC
  Administered 2016-10-01: 20 mg via INTRAVENOUS
  Filled 2016-10-01: qty 2

## 2016-10-01 MED ORDER — DIPHENHYDRAMINE HCL 50 MG/ML IJ SOLN
50.0000 mg | Freq: Once | INTRAMUSCULAR | Status: AC
  Administered 2016-10-01: 50 mg via INTRAVENOUS
  Filled 2016-10-01: qty 1

## 2016-10-01 MED ORDER — HEPARIN SOD (PORK) LOCK FLUSH 100 UNIT/ML IV SOLN
500.0000 [IU] | Freq: Once | INTRAVENOUS | Status: AC | PRN
  Administered 2016-10-01: 500 [IU]
  Filled 2016-10-01: qty 5

## 2016-10-01 NOTE — Progress Notes (Signed)
ANC 1.4. Per Dr Rogue Bussing may proceed with treatment today

## 2016-10-03 ENCOUNTER — Ambulatory Visit: Payer: PPO

## 2016-10-08 ENCOUNTER — Inpatient Hospital Stay: Payer: PPO

## 2016-10-08 ENCOUNTER — Inpatient Hospital Stay (HOSPITAL_BASED_OUTPATIENT_CLINIC_OR_DEPARTMENT_OTHER): Payer: PPO | Admitting: Internal Medicine

## 2016-10-08 ENCOUNTER — Telehealth: Payer: Self-pay | Admitting: Internal Medicine

## 2016-10-08 VITALS — BP 138/83 | HR 91 | Temp 96.6°F | Resp 20 | Ht 63.5 in | Wt 187.0 lb

## 2016-10-08 DIAGNOSIS — R971 Elevated cancer antigen 125 [CA 125]: Secondary | ICD-10-CM | POA: Diagnosis not present

## 2016-10-08 DIAGNOSIS — L271 Localized skin eruption due to drugs and medicaments taken internally: Secondary | ICD-10-CM

## 2016-10-08 DIAGNOSIS — Z79899 Other long term (current) drug therapy: Secondary | ICD-10-CM

## 2016-10-08 DIAGNOSIS — Z800 Family history of malignant neoplasm of digestive organs: Secondary | ICD-10-CM

## 2016-10-08 DIAGNOSIS — C569 Malignant neoplasm of unspecified ovary: Secondary | ICD-10-CM | POA: Diagnosis not present

## 2016-10-08 DIAGNOSIS — C786 Secondary malignant neoplasm of retroperitoneum and peritoneum: Secondary | ICD-10-CM | POA: Diagnosis not present

## 2016-10-08 DIAGNOSIS — R110 Nausea: Secondary | ICD-10-CM

## 2016-10-08 DIAGNOSIS — Z853 Personal history of malignant neoplasm of breast: Secondary | ICD-10-CM

## 2016-10-08 DIAGNOSIS — I493 Ventricular premature depolarization: Secondary | ICD-10-CM

## 2016-10-08 DIAGNOSIS — I4891 Unspecified atrial fibrillation: Secondary | ICD-10-CM | POA: Diagnosis not present

## 2016-10-08 DIAGNOSIS — K219 Gastro-esophageal reflux disease without esophagitis: Secondary | ICD-10-CM

## 2016-10-08 DIAGNOSIS — Z803 Family history of malignant neoplasm of breast: Secondary | ICD-10-CM

## 2016-10-08 DIAGNOSIS — F419 Anxiety disorder, unspecified: Secondary | ICD-10-CM | POA: Diagnosis not present

## 2016-10-08 DIAGNOSIS — K5730 Diverticulosis of large intestine without perforation or abscess without bleeding: Secondary | ICD-10-CM | POA: Diagnosis not present

## 2016-10-08 DIAGNOSIS — E039 Hypothyroidism, unspecified: Secondary | ICD-10-CM

## 2016-10-08 DIAGNOSIS — I700 Atherosclerosis of aorta: Secondary | ICD-10-CM | POA: Diagnosis not present

## 2016-10-08 DIAGNOSIS — Z5111 Encounter for antineoplastic chemotherapy: Secondary | ICD-10-CM | POA: Diagnosis not present

## 2016-10-08 DIAGNOSIS — Z90722 Acquired absence of ovaries, bilateral: Secondary | ICD-10-CM

## 2016-10-08 DIAGNOSIS — Z808 Family history of malignant neoplasm of other organs or systems: Secondary | ICD-10-CM

## 2016-10-08 DIAGNOSIS — I340 Nonrheumatic mitral (valve) insufficiency: Secondary | ICD-10-CM

## 2016-10-08 DIAGNOSIS — Z90710 Acquired absence of both cervix and uterus: Secondary | ICD-10-CM

## 2016-10-08 DIAGNOSIS — E785 Hyperlipidemia, unspecified: Secondary | ICD-10-CM | POA: Diagnosis not present

## 2016-10-08 LAB — CBC WITH DIFFERENTIAL/PLATELET
Basophils Absolute: 0 10*3/uL (ref 0–0.1)
Basophils Relative: 1 %
Eosinophils Absolute: 0 10*3/uL (ref 0–0.7)
Eosinophils Relative: 1 %
HEMATOCRIT: 35.2 % (ref 35.0–47.0)
HEMOGLOBIN: 12.5 g/dL (ref 12.0–16.0)
LYMPHS PCT: 60 %
Lymphs Abs: 2 10*3/uL (ref 1.0–3.6)
MCH: 35.5 pg — AB (ref 26.0–34.0)
MCHC: 35.6 g/dL (ref 32.0–36.0)
MCV: 99.6 fL (ref 80.0–100.0)
MONO ABS: 0.3 10*3/uL (ref 0.2–0.9)
MONOS PCT: 8 %
NEUTROS ABS: 1 10*3/uL — AB (ref 1.4–6.5)
NEUTROS PCT: 30 %
Platelets: 232 10*3/uL (ref 150–440)
RBC: 3.53 MIL/uL — ABNORMAL LOW (ref 3.80–5.20)
RDW: 13.4 % (ref 11.5–14.5)
WBC: 3.4 10*3/uL — ABNORMAL LOW (ref 3.6–11.0)

## 2016-10-08 LAB — URINALYSIS, COMPLETE (UACMP) WITH MICROSCOPIC
BILIRUBIN URINE: NEGATIVE
Bacteria, UA: NONE SEEN
Glucose, UA: NEGATIVE mg/dL
Hgb urine dipstick: NEGATIVE
KETONES UR: NEGATIVE mg/dL
LEUKOCYTES UA: NEGATIVE
Nitrite: NEGATIVE
PH: 5 (ref 5.0–8.0)
PROTEIN: NEGATIVE mg/dL
Specific Gravity, Urine: 1.01 (ref 1.005–1.030)

## 2016-10-08 LAB — COMPREHENSIVE METABOLIC PANEL
ALBUMIN: 3.4 g/dL — AB (ref 3.5–5.0)
ALK PHOS: 67 U/L (ref 38–126)
ALT: 18 U/L (ref 14–54)
ANION GAP: 9 (ref 5–15)
AST: 22 U/L (ref 15–41)
BILIRUBIN TOTAL: 0.6 mg/dL (ref 0.3–1.2)
BUN: 14 mg/dL (ref 6–20)
CALCIUM: 9.2 mg/dL (ref 8.9–10.3)
CO2: 22 mmol/L (ref 22–32)
Chloride: 108 mmol/L (ref 101–111)
Creatinine, Ser: 0.87 mg/dL (ref 0.44–1.00)
GLUCOSE: 95 mg/dL (ref 65–99)
POTASSIUM: 3.8 mmol/L (ref 3.5–5.1)
Sodium: 139 mmol/L (ref 135–145)
Total Protein: 6.8 g/dL (ref 6.5–8.1)

## 2016-10-08 MED ORDER — SODIUM CHLORIDE 0.9 % IV SOLN
20.0000 mg | Freq: Once | INTRAVENOUS | Status: AC
  Administered 2016-10-08: 20 mg via INTRAVENOUS
  Filled 2016-10-08: qty 2

## 2016-10-08 MED ORDER — SODIUM CHLORIDE 0.9 % IV SOLN
80.0000 mg/m2 | Freq: Once | INTRAVENOUS | Status: AC
  Administered 2016-10-08: 156 mg via INTRAVENOUS
  Filled 2016-10-08: qty 26

## 2016-10-08 MED ORDER — SODIUM CHLORIDE 0.9% FLUSH
10.0000 mL | INTRAVENOUS | Status: DC | PRN
  Administered 2016-10-08: 10 mL
  Filled 2016-10-08: qty 10

## 2016-10-08 MED ORDER — FAMOTIDINE IN NACL 20-0.9 MG/50ML-% IV SOLN
20.0000 mg | Freq: Once | INTRAVENOUS | Status: AC
  Administered 2016-10-08: 20 mg via INTRAVENOUS
  Filled 2016-10-08: qty 50

## 2016-10-08 MED ORDER — SODIUM CHLORIDE 0.9 % IV SOLN
800.0000 mg | Freq: Once | INTRAVENOUS | Status: AC
  Administered 2016-10-08: 800 mg via INTRAVENOUS
  Filled 2016-10-08: qty 32

## 2016-10-08 MED ORDER — SODIUM CHLORIDE 0.9 % IV SOLN
Freq: Once | INTRAVENOUS | Status: AC
  Administered 2016-10-08: 11:00:00 via INTRAVENOUS
  Filled 2016-10-08: qty 1000

## 2016-10-08 MED ORDER — HEPARIN SOD (PORK) LOCK FLUSH 100 UNIT/ML IV SOLN
500.0000 [IU] | Freq: Once | INTRAVENOUS | Status: AC | PRN
  Administered 2016-10-08: 500 [IU]
  Filled 2016-10-08: qty 5

## 2016-10-08 MED ORDER — DIPHENHYDRAMINE HCL 50 MG/ML IJ SOLN
50.0000 mg | Freq: Once | INTRAMUSCULAR | Status: AC
Start: 2016-10-08 — End: 2016-10-08
  Administered 2016-10-08: 50 mg via INTRAVENOUS
  Filled 2016-10-08: qty 1

## 2016-10-08 NOTE — Progress Notes (Signed)
Per Dr. Rogue Bussing, proceed with treatment today with ANC of 1.0

## 2016-10-08 NOTE — Telephone Encounter (Signed)
H/J- please cancel her appt for 1 week treatment/labs; my plan was to do day-1,8,15 treatments; and day-22 OFF. I will see her back in 2 weeks/lans-tax-avastin-Thx

## 2016-10-08 NOTE — Progress Notes (Signed)
I met with patient today in lobby while she was waiting for her physician appointment and asked her to completed 12 month post randomization Quality of Life survey for research study follow up for GOG 0225.  Patient is currently in follow up status for GOG 0225.  Patient completed survey and this has been mailed to [employer].  Patient agreeable to complete next follow up form which is due 24 month post randomization (around 10/08/2017).  I thanked patient for her continued study participation.

## 2016-10-08 NOTE — Progress Notes (Signed)
[facility] OFFICE PROGRESS NOTE  Patient Care Team: Jerrol Banana., MD as PCP - General (Family Medicine) [facility] Gaetana Michaelis, MD as Referring Physician (Obstetrics and Gynecology) Robert Bellow, MD as Consulting Physician (General Surgery)  Ovarian cancer [facility])   Staging form: Ovary, AJCC 7th Edition     Clinical: Stage IIIC (T3c, N0, M0) - Unsigned    Oncology History   # April- MAY 2016- OVARIAN CANCER STAGE IIIC; CA-125 +2300+;  s/p OPTIMAL DEBULKING SURGERY   # MAY 2016[name] [finished in Sep 19th 2017]  # MAY CT 2017- RECURRENT/Peritoneal carcinomatosis/pelvic implant ~1.2cm/Ca-125-34;   # July 3rd- CARBO-DOXIL q 4W [with neulasta]; CT May 11 2016- slight increase peritoneal / stable nodules. Cont chemo [finished DEC 2017]. DEC 28th 2017 CT- Increase in peritoneal deposits; increasing Ca 125 [60s]  # G-1-2 hand foot syndrome-   # LEFT BREAST CA s/p Lumpec & RT s/p TAM   # Afib [cardiology]; JUNE 2017- MUGA 62%  MOLECULAR TESTING- ATM genetic mutation      Ovary cancer, unspecified laterality ([place])   12/16/2014 Initial Diagnosis    Ovarian cancer       INTERVAL HISTORY:  76 year old female patient with above history of ovarian cancer recurrent Platinum Refractory ovarian cancer [based on CT scan in December 2017]- is here for follow-up; Patient is currently on Avastin and Taxol. Currently status post cycle #1 day 8.  Patient complained of mild nausea without vomiting after chemotherapy. Symptoms improved after taking Compazine along with Zofran. Denies any significant tingling or numbness.  Denies any fatigue. Denies any abdominal pain. Denies any abdominal distention. No chest pain or shortness of breath or cough. No hair loss yet.  REVIEW OF SYSTEMS:  A complete 10 point review of system is done which is negative except mentioned above/history of present illness.   PAST MEDICAL HISTORY :  Past Medical History:  Diagnosis  Date  . Atrial fibrillation ([place])   . Cancer of breast ([place]) 1992   Left  . GERD (gastroesophageal reflux disease)   . Hyperlipidemia   . Hypothyroidism   . Mitral insufficiency   . Ovarian cancer [facility])    s/p BSO optimal tumor debulking May 2016  . PVC (premature ventricular contraction)     PAST SURGICAL HISTORY :   Past Surgical History:  Procedure Laterality Date  . ABDOMINAL HYSTERECTOMY    . BILATERAL SALPINGOOPHORECTOMY  May 2016   with optimal tumor debulking   . BREAST SURGERY     Left  . CESAREAN SECTION     x2  . CHOLECYSTECTOMY    . COLONOSCOPY  2006  . DEBULKING N/A 12/22/2014   Procedure: DEBULKING;  Surgeon: Gillis Ends, MD;  Location: [facility] ORS;  Service: Gynecology;  Laterality: N/A;  . LAPAROTOMY N/A 12/22/2014   Procedure: EXPLORATORY LAPAROTOMY;  Surgeon: Robert Bellow, MD;  Location: [facility] ORS;  Service: General;  Laterality: N/A;  . LAPAROTOMY WITH STAGING N/A 12/22/2014   Procedure: LAPAROTOMY WITH STAGING;  Surgeon: Gillis Ends, MD;  Location: [facility] ORS;  Service: Gynecology;  Laterality: N/A;  . PERIPHERAL VASCULAR CATHETERIZATION Left 12/22/2014   Procedure: PORTA CATH INSERTION;  Surgeon: Robert Bellow, MD;  Location: [facility] ORS;  Service: General;  Laterality: Left;  . PORTACATH PLACEMENT Right 12/22/2014   Procedure: INSERTION PORT-A-CATH;  Surgeon: Robert Bellow, MD;  Location: [facility] ORS;  Service: General;  Laterality: Right;  . SALPINGOOPHORECTOMY Bilateral 12/22/2014   Procedure: SALPINGO OOPHORECTOMY;  Surgeon: Gillis Ends, MD;  Location: [facility] ORS;  Service: Gynecology;  Laterality: Bilateral;  . UPPER GI ENDOSCOPY  09/25/04   hiatus hernia, schatzki ring and a single gastric polyp    FAMILY HISTORY :   Family History  Problem Relation Age of Onset  . Cancer Mother     breast, throat, and stomach  . Breast cancer Mother   . Heart disease Father   . Pulmonary embolism Sister   . Cancer Brother 60     angiosarcoma of the chest; carcinoid small intestinal tumor  . Hypertension Brother   . Pulmonary embolism Other   . Cancer Other   . Cancer Maternal Aunt     breast cancer  . Cancer Maternal Grandfather 39    pancreatic    SOCIAL HISTORY:   Social History  Substance Use Topics  . Smoking status: Never Smoker  . Smokeless tobacco: Never Used  . Alcohol use No    ALLERGIES:  is allergic to carafate [sucralfate] and sulfa antibiotics.  MEDICATIONS:  Current Outpatient Prescriptions  Medication Sig Dispense Refill  . acetaminophen (TYLENOL) 500 MG chewable tablet Chew 500 mg by mouth every 6 (six) hours as needed for pain.    . Cholecalciferol (VITAMIN D) 2000 units tablet Take 1 tablet (2,000 Units total) by mouth daily. 30 tablet 12  . lactulose (CHRONULAC) 10 GM/15ML solution TAKE 15MLS (10G TOTAL) BY MOUTH THREE TIMES A DAY AS DIRECTED BY PHYSICIAN. 240 mL 1  . levothyroxine (SYNTHROID, LEVOTHROID) 50 MCG tablet Take 1 tablet (50 mcg total) by mouth daily. 30 tablet 12  . lidocaine-prilocaine (EMLA) cream Apply to port area 30-45 mins prior to chemo. 30 g 5  . LORazepam (ATIVAN) 0.5 MG tablet Take 0.5 tablets (0.25 mg total) by mouth at bedtime. 30 tablet 0  . Multiple Vitamin (MULTIVITAMIN) capsule Take 1 capsule by mouth daily.    . ondansetron (ZOFRAN) 4 MG tablet Take 4 mg by mouth every 6 (six) hours as needed for nausea or vomiting. Reported on 02/20/2016    . pantoprazole (PROTONIX) 40 MG tablet Take 1 tablet (40 mg total) by mouth daily. 30 tablet 12  . polyethylene glycol (MIRALAX / GLYCOLAX) packet Take 17 g by mouth daily. 30 each 4  . prochlorperazine (COMPAZINE) 10 MG tablet Take 1 tablet (10 mg total) by mouth every 6 (six) hours as needed for nausea or vomiting. 30 tablet 0  . propafenone (RYTHMOL) 225 MG tablet Take 225 mg by mouth 2 (two) times daily.    . chlorhexidine (PERIDEX) 0.12 % solution Use as directed 5 mLs in the mouth or throat as needed.     . traMADol  (ULTRAM) 50 MG tablet Take 1 tablet (50 mg total) by mouth every 6 (six) hours as needed. (Patient not taking: Reported on 10/08/2016) 30 tablet 0   No current facility-administered medications for this visit.    Facility-Administered Medications Ordered in Other Visits  Medication Dose Route Frequency Provider Last Rate Last Dose  . sodium chloride 0.9 % injection 10 mL  10 mL Intracatheter PRN Forest Gleason, MD   10 mL at 02/07/15 0930    PHYSICAL EXAMINATION: ECOG PERFORMANCE STATUS: 0 - Asymptomatic  BP 138/83 (BP Location: Left Arm, Patient Position: Sitting)   Pulse 91   Temp (!) 96.6 F (35.9 C) (Tympanic)   Resp 20   Ht 5' 3.5" (1.613 m)   Wt 187 lb (84.8 kg)   BMI 32.61 kg/m   [employer]  10/08/16 0944  Weight: 187 lb (84.8 kg)    GENERAL: Well-nourished well-developed; Alert, no distress and comfortAccompanied by her husband. EYES: no pallor or icterus OROPHARYNX: no thrush or ulceration; good dentition  NECK: supple, no masses felt LYMPH:  no palpable lymphadenopathy in the cervical, axillary or inguinal regions LUNGS: clear to auscultation and  No wheeze or crackles HEART/CVS: regular rate & rhythm and no murmurs; No lower extremity edema ABDOMEN:abdomen soft, non-tender and normal bowel sounds Musculoskeletal:no cyanosis of digits and no clubbing  PSYCH: alert & oriented x 3 with fluent speech NEURO: no focal motor/sensory deficits SKIN:  Normal. No rash noted.   LABORATORY DATA:  I have reviewed the data as listed    Component Value Date/Time   NA 139 10/08/2016 0900   NA 140 12/14/2014 1037   K 3.8 10/08/2016 0900   K 4.4 12/14/2014 1037   CL 108 10/08/2016 0900   CL 106 12/14/2014 1037   CO2 22 10/08/2016 0900   CO2 26 12/14/2014 1037   GLUCOSE 95 10/08/2016 0900   GLUCOSE 87 12/14/2014 1037   BUN 14 10/08/2016 0900   BUN 12 12/14/2014 1037   CREATININE 0.87 10/08/2016 0900   CREATININE 0.97 12/14/2014 1037   CALCIUM 9.2 10/08/2016 0900    CALCIUM 9.2 12/14/2014 1037   PROT 6.8 10/08/2016 0900   PROT 7.3 12/14/2014 1037   ALBUMIN 3.4 (L) 10/08/2016 0900   ALBUMIN 3.8 12/14/2014 1037   AST 22 10/08/2016 0900   AST 20 12/14/2014 1037   ALT 18 10/08/2016 0900   ALT 11 (L) 12/14/2014 1037   ALKPHOS 67 10/08/2016 0900   ALKPHOS 138 (H) 12/14/2014 1037   BILITOT 0.6 10/08/2016 0900   BILITOT 0.5 12/14/2014 1037   GFRNONAA >60 10/08/2016 0900   GFRNONAA 59 (L) 12/14/2014 1037   GFRAA >60 10/08/2016 0900   GFRAA >60 12/14/2014 1037    No results found for: SPEP, UPEP  Lab Results  Component Value Date   WBC 3.4 (L) 10/08/2016   NEUTROABS 1.0 (L) 10/08/2016   HGB 12.5 10/08/2016   HCT 35.2 10/08/2016   MCV 99.6 10/08/2016   PLT 232 10/08/2016      Chemistry      Component Value Date/Time   NA 139 10/08/2016 0900   NA 140 12/14/2014 1037   K 3.8 10/08/2016 0900   K 4.4 12/14/2014 1037   CL 108 10/08/2016 0900   CL 106 12/14/2014 1037   CO2 22 10/08/2016 0900   CO2 26 12/14/2014 1037   BUN 14 10/08/2016 0900   BUN 12 12/14/2014 1037   CREATININE 0.87 10/08/2016 0900   CREATININE 0.97 12/14/2014 1037   GLU 84 03/16/2014      Component Value Date/Time   CALCIUM 9.2 10/08/2016 0900   CALCIUM 9.2 12/14/2014 1037   ALKPHOS 67 10/08/2016 0900   ALKPHOS 138 (H) 12/14/2014 1037   AST 22 10/08/2016 0900   AST 20 12/14/2014 1037   ALT 18 10/08/2016 0900   ALT 11 (L) 12/14/2014 1037   BILITOT 0.6 10/08/2016 0900   BILITOT 0.5 12/14/2014 1037    IMPRESSION: 1. Today's study demonstrates slight interval growth of several peritoneal implants in the low anatomic pelvis indicative of slight progression of disease. No other new peritoneal deposits are noted, and there is no other metastatic disease noted elsewhere in the chest, abdomen or pelvis. 2. Colonic diverticulosis without evidence of acute diverticulitis at this time. 3. Aortic atherosclerosis, in addition to left main  coronary artery disease.  Assessment for potential risk factor modification, dietary therapy or pharmacologic therapy may be warranted, if clinically indicated. 4. Additional incidental findings, as above.   Electronically Signed   By: Vinnie Langton M.D.   On: 08/15/2016 14:08   RADIOGRAPHIC STUDIES: I have personally reviewed the radiological images as listed and agreed with the findings in the report. No results found.   ASSESSMENT & PLAN:  Ovary cancer, unspecified laterality ([place]) Recurrent  Platinum refractory ovarian cancer [dec 2017]slight increase in the CA-125.DEC 2017-  CT scan shows slight increase in the peritoneal deposits in the pelvis. Currently On Taxol- Avastin [day1,8 & 15 every 28 days [off-day 22]; Avastin every 2 weeks.  # proceed with cycle #1 day 15 today. Tolerating chemo well. Labs today reviewed;  acceptable for treatment today.   # Also discussed the role of use of PARP inhibitors [Zejula or rupacarib] for maintenance/treatment of ovarian cancer. Patient is in ATM- mutated. Awaiting on HRD testing on the tumor.  # follow up in 2 weeks/ labs-UA; ca 125/ Taxol-Avastin.   Orders Placed This Encounter  Procedures  . CBC with Differential    Standing Status:   Future    Standing Expiration Date:   10/08/2017  . Comprehensive metabolic panel    Standing Status:   Future    Standing Expiration Date:   10/08/2017  . CA 125    Standing Status:   Future    Standing Expiration Date:   10/08/2017  . CBC with Differential    Standing Status:   Future    Standing Expiration Date:   10/08/2017  . Comprehensive metabolic panel    Standing Status:   Future    Standing Expiration Date:   10/08/2017  . CA 125    Standing Status:   Future    Standing Expiration Date:   10/08/2017  . CBC with Differential    Standing Status:   Future    Standing Expiration Date:   10/08/2017  . Basic metabolic panel    Standing Status:   Future    Standing Expiration Date:   10/08/2017  . CBC with  Differential    Standing Status:   Future    Standing Expiration Date:   10/08/2017  . Basic metabolic panel    Standing Status:   Future    Standing Expiration Date:   10/08/2017   All questions were answered. The patient knows to call the clinic with any problems, questions or concerns.   Addendum:  HRD testing on the tumor was ordred on Jan 4th.    Cammie Sickle, MD 10/08/2016 5:18 PM

## 2016-10-08 NOTE — Assessment & Plan Note (Addendum)
Recurrent  Platinum refractory ovarian cancer [dec 2017]slight increase in the CA-125.DEC 2017-  CT scan shows slight increase in the peritoneal deposits in the pelvis. Currently On Taxol- Avastin [day1,8 & 15 every 28 days [off-day 22]; Avastin every 2 weeks.  # proceed with cycle #1 day 15 today. Tolerating chemo well. Labs today reviewed;  acceptable for treatment today.   # Also discussed the role of use of PARP inhibitors [Zejula or rupacarib] for maintenance/treatment of ovarian cancer. Patient is in ATM- mutated. Awaiting on HRD testing on the tumor.  # follow up in 2 weeks/ labs-UA; ca 125/ Taxol-Avastin.

## 2016-10-09 NOTE — Telephone Encounter (Signed)
Pt aware - does not need to come next week. apts for next Tuesday cnl by rn

## 2016-10-10 ENCOUNTER — Encounter: Payer: Self-pay | Admitting: Obstetrics and Gynecology

## 2016-10-10 ENCOUNTER — Inpatient Hospital Stay (HOSPITAL_BASED_OUTPATIENT_CLINIC_OR_DEPARTMENT_OTHER): Payer: PPO | Admitting: Obstetrics and Gynecology

## 2016-10-10 VITALS — BP 124/74 | HR 85 | Temp 97.8°F | Resp 18 | Ht 63.5 in | Wt 186.7 lb

## 2016-10-10 DIAGNOSIS — I10 Essential (primary) hypertension: Secondary | ICD-10-CM | POA: Diagnosis not present

## 2016-10-10 DIAGNOSIS — Z90722 Acquired absence of ovaries, bilateral: Secondary | ICD-10-CM | POA: Diagnosis not present

## 2016-10-10 DIAGNOSIS — Z90710 Acquired absence of both cervix and uterus: Secondary | ICD-10-CM

## 2016-10-10 DIAGNOSIS — C786 Secondary malignant neoplasm of retroperitoneum and peritoneum: Secondary | ICD-10-CM

## 2016-10-10 DIAGNOSIS — I493 Ventricular premature depolarization: Secondary | ICD-10-CM | POA: Diagnosis not present

## 2016-10-10 DIAGNOSIS — E785 Hyperlipidemia, unspecified: Secondary | ICD-10-CM

## 2016-10-10 DIAGNOSIS — I480 Paroxysmal atrial fibrillation: Secondary | ICD-10-CM | POA: Diagnosis not present

## 2016-10-10 DIAGNOSIS — K219 Gastro-esophageal reflux disease without esophagitis: Secondary | ICD-10-CM | POA: Diagnosis not present

## 2016-10-10 DIAGNOSIS — Z5111 Encounter for antineoplastic chemotherapy: Secondary | ICD-10-CM | POA: Diagnosis not present

## 2016-10-10 DIAGNOSIS — E039 Hypothyroidism, unspecified: Secondary | ICD-10-CM

## 2016-10-10 DIAGNOSIS — I340 Nonrheumatic mitral (valve) insufficiency: Secondary | ICD-10-CM | POA: Diagnosis not present

## 2016-10-10 DIAGNOSIS — M5137 Other intervertebral disc degeneration, lumbosacral region: Secondary | ICD-10-CM

## 2016-10-10 DIAGNOSIS — Z79899 Other long term (current) drug therapy: Secondary | ICD-10-CM

## 2016-10-10 DIAGNOSIS — C569 Malignant neoplasm of unspecified ovary: Secondary | ICD-10-CM

## 2016-10-10 NOTE — Progress Notes (Signed)
  Oncology Nurse Navigator Documentation Chaperoned pelvic exam. HDR still pending. Follow up in 2 months with Dr. Theora Gianotti. Navigator Location: [facility] (10/10/16 1000)   )Navigator Encounter Type: Follow-up Appt (10/10/16 1000)                                                    Time Spent with Patient: 15 (10/10/16 1000)

## 2016-10-10 NOTE — Progress Notes (Signed)
Pt having some back pain today and feels it more when she has avastin treatment and also has irregular heart rate, constipation to diarrhea due to chemo. Numbness and tingling from chemo.

## 2016-10-10 NOTE — Progress Notes (Signed)
Gynecologic Oncology Interval Visit   Referring Provider: Blain Pais, MD.  Chief Concern: Platinum-resistant recurrent stage IIIc ovarian cancer  Subjective:  Jamie Conner is a 76 y.o. female who is seen for newly diagnosed platinum-resistant recurrent stage IIIc ovarian cancer.   She has recently started paclitaxel and bevacizumab under the direction of Dr. Rogue Bussing on 09/25/2015. She is tolerating therapy well except for voice changes and epitaxis. Her daughter came with her to her visit today.    Lab Results  Component Value Date   CA125 101.7 (H) 09/24/2016   CA125 64.5 (H) 08/15/2016   CA125 50.4 (H) 06/27/2016       Gynecologic Oncology History Jamie Conner is a pleasant female who is seen for postoperative visit for stage IIIc high-grade serous ovarian cancer. See prior notes for complete detail.  She also has a history of  carcinoma of breast ( left) status post lumpectomy , radiation therapy and 5 years of tamoxifen. She underwent exploratory Laparotomy, bilateral salpingo-oophorectomy, right ureterolysis, infragastric omentectomy, and optimally debulked to no gross residual  May 4th, 2016. Preop CA125 2354.0.  She started chemotherapy with carboplatinum and Taxol in dose dense fashion from Jan 02, 2015. She underwent 6 cycles of chemotherapy completed 05/09/2015. Her dose of Taxol was reduced because of myelosuppression and fever in spite of Neulasta.  Nadir CA125 = 9.8  06/06/2015 CT scan abdomen and pelvis IMPRESSION: 1. Interval removal of the large right ovarian mass . Dramatic improvement in the appearance of mesenteric and omental implants. There is a 3 mm potential faint omental nodule at about the level of the umbilicus but for the most part the numerous prior omental and mesenteric tumor implants have resolved completely. 2. 1.8 by 1.2 cm fluid density structure along the splenic hilum, no change from prior, likely a small benign cystic lesion.  3.  Other imaging findings of potential clinical significance: 3 cm periampullary duodenal diverticulum.   01/04/16 CA125 34.1  01/10/2016 CT scan chest abdomen and pelvis. 1. New nodular density 12 x 7 mm ties are seen throughout the pelvis. The largest measures 12 x 10 mm best seen on image number 105 of series. A 9 mm nodule is seen in the left pelvis best seen on image number 60 of series 5. These are concerning for possible peritoneal implants. 2. Stable 1.9 cm cystic lesion seen in splenic hilum.  Vaginal biopsy performed on 01/04/2016 negative  She was started on PLD and carboplatin   05/11/2016 CT scan abdomen and pelvis IMPRESSION: 1. No acute findings identified within the abdomen or pelvis. 2. Peritoneal nodules within the right side of pelvis are stable the slightly increased in size from previous exam. No new areas of disease identified.  She has completed 6 cycles of  PLD and carboplatin therapy and imaging revealed progressive disease.   08/15/2016 CT scan abdomen and pelvis 1. Study demonstrates slight interval growth of several peritoneal implants in the low anatomic pelvis indicative of slight progression of disease. No other new peritoneal deposits are noted, and there is no other metastatic disease noted elsewhere in the chest, abdomen or pelvis. 2. Colonic diverticulosis without evidence of acute diverticulitis at this time. 3. Aortic atherosclerosis, in addition to left main coronary artery disease. Assessment for potential risk factor modification, dietary therapy or pharmacologic therapy may be warranted, if clinically indicated. 4. Additional incidental findings, as above.  HRD testing ordered:  Rucaparib denied by insurance.   She was started on paclitaxel and bevacizumab on  09/24/2016.   CA125 02/20/2016 47.5 03/21/2016 31.1 05/16/2016 37.5 06/13/2016 44.8 06/27/2016 50.4 08/15/2016 64.5 09/24/2016 101.7   Genetic testing: ATM mutation [id number].  I spoke  with Dr. Lisbeth Ply at [facility] and he recommended genetic counseling for the patient and possibly other family members. He provided a phone number for them to call to schedule that appointment. The number is [phone].  Problem List: Patient Active Problem List   Diagnosis Date Noted  . Encounter for monitoring cardiotoxic drug therapy 02/02/2016  . Breast cancer in female [facility]) 12/13/2015  . Peptic ulcer disease 12/12/2015  . DDD (degenerative disc disease), lumbosacral 12/12/2015  . Hyperlipidemia 12/12/2015  . Acute anxiety 12/12/2015  . Insomnia 12/12/2015  . Allergic rhinitis 12/12/2015  . GERD (gastroesophageal reflux disease) 12/12/2015  . Internal hemorrhoid 12/12/2015  . OA (osteoarthritis) 12/12/2015  . Osteopenia 12/12/2015  . Hypothyroid 04/11/2015  . Benign essential HTN 02/01/2015  . Retroperitoneal fibrosis   . Combined fat and carbohydrate induced hyperlipemia 01/04/2015  . Awareness of heartbeats 01/04/2015  . Beat, premature ventricular 01/04/2015  . Malignant neoplasm of ovary ([place]) 12/16/2014  . MI (mitral incompetence) 09/02/2014  . AF (paroxysmal atrial fibrillation) ([place]) 09/02/2014    Past Medical History: Past Medical History:  Diagnosis Date  . Atrial fibrillation ([place])   . Cancer of breast ([place]) 1992   Left  . GERD (gastroesophageal reflux disease)   . Hyperlipidemia   . Hypothyroidism   . Mitral insufficiency   . Ovarian cancer [facility])    s/p BSO optimal tumor debulking May 2016  . PVC (premature ventricular contraction)     Past Surgical History: Past Surgical History:  Procedure Laterality Date  . ABDOMINAL HYSTERECTOMY    . BILATERAL SALPINGOOPHORECTOMY  May 2016   with optimal tumor debulking   . BREAST SURGERY     Left  . CESAREAN SECTION     x2  . CHOLECYSTECTOMY    . COLONOSCOPY  2006  . DEBULKING N/A 12/22/2014   Procedure: DEBULKING;  Surgeon: Gillis Ends, MD;  Location: [facility] ORS;  Service: Gynecology;  Laterality:  N/A;  . LAPAROTOMY N/A 12/22/2014   Procedure: EXPLORATORY LAPAROTOMY;  Surgeon: Robert Bellow, MD;  Location: [facility] ORS;  Service: General;  Laterality: N/A;  . LAPAROTOMY WITH STAGING N/A 12/22/2014   Procedure: LAPAROTOMY WITH STAGING;  Surgeon: Gillis Ends, MD;  Location: [facility] ORS;  Service: Gynecology;  Laterality: N/A;  . PERIPHERAL VASCULAR CATHETERIZATION Left 12/22/2014   Procedure: PORTA CATH INSERTION;  Surgeon: Robert Bellow, MD;  Location: [facility] ORS;  Service: General;  Laterality: Left;  . PORTACATH PLACEMENT Right 12/22/2014   Procedure: INSERTION PORT-A-CATH;  Surgeon: Robert Bellow, MD;  Location: [facility] ORS;  Service: General;  Laterality: Right;  . SALPINGOOPHORECTOMY Bilateral 12/22/2014   Procedure: SALPINGO OOPHORECTOMY;  Surgeon: Gillis Ends, MD;  Location: [facility] ORS;  Service: Gynecology;  Laterality: Bilateral;  . UPPER GI ENDOSCOPY  09/25/04   hiatus hernia, schatzki ring and a single gastric polyp    Past Gynecologic History:  See HPI  OB History:  OB History  Gravida Para Term Preterm AB Living  2         2  SAB TAB Ectopic Multiple Live Births               # Outcome Date GA Lbr Len/2nd Weight Sex Delivery Anes PTL Lv  2 [place]           1 [place]  Obstetric Comments  1st Menstrual Cycle:  14  1st Pregnancy:  32    Family History: Family History  Problem Relation Age of Onset  . Cancer Mother     breast, throat, and stomach  . Breast cancer Mother   . Heart disease Father   . Pulmonary embolism Sister   . Cancer Brother 60    angiosarcoma of the chest; carcinoid small intestinal tumor  . Hypertension Brother   . Cancer Maternal Aunt     breast cancer  . Cancer Maternal Grandfather 77    pancreatic    Social History: Social History   Social History  . Marital status: Married    Spouse name: N/A  . Number of children: N/A  . Years of education: N/A   Occupational History  . Not on file.   Social  History Main Topics  . Smoking status: Never Smoker  . Smokeless tobacco: Never Used  . Alcohol use No  . Drug use: No  . Sexual activity: Not Currently   Other Topics Concern  . Not on file   Social History Narrative  . No narrative on file    Allergies: Allergies  Allergen Reactions  . Carafate [Sucralfate] Rash  . Sulfa Antibiotics Rash    Current Medications: Current Outpatient Prescriptions  Medication Sig Dispense Refill  . acetaminophen (TYLENOL) 500 MG chewable tablet Chew 500 mg by mouth every 6 (six) hours as needed for pain.    . chlorhexidine (PERIDEX) 0.12 % solution Use as directed 5 mLs in the mouth or throat as needed.     . Cholecalciferol (VITAMIN D) 2000 units tablet Take 1 tablet (2,000 Units total) by mouth daily. 30 tablet 12  . lactulose (CHRONULAC) 10 GM/15ML solution TAKE (10G TOTAL) BY MOUTH THREE TIMES A DAY AS DIRECTED BY PHYSICIAN. 240 mL 1  . levothyroxine (SYNTHROID, LEVOTHROID) 50 MCG tablet Take 1 tablet (50 mcg total) by mouth daily. 30 tablet 12  . lidocaine-prilocaine (EMLA) cream Apply to port area 30-45 mins prior to chemo. 30 g 5  . LORazepam (ATIVAN) 0.5 MG tablet Take 0.5 tablets (0.25 mg total) by mouth at bedtime. 30 tablet 0  . Multiple Vitamin (MULTIVITAMIN) capsule Take 1 capsule by mouth daily.    . ondansetron (ZOFRAN) 4 MG tablet Take 4 mg by mouth every 6 (six) hours as needed for nausea or vomiting. Reported on 02/20/2016    . pantoprazole (PROTONIX) 40 MG tablet Take 1 tablet (40 mg total) by mouth daily. 30 tablet 12  . polyethylene glycol (MIRALAX / GLYCOLAX) packet Take 17 g by mouth daily. 30 each 4  . prochlorperazine (COMPAZINE) 10 MG tablet Take 1 tablet (10 mg total) by mouth every 6 (six) hours as needed for nausea or vomiting. 30 tablet 0  . propafenone (RYTHMOL) 225 MG tablet Take 225 mg by mouth 2 (two) times daily.    . traMADol (ULTRAM) 50 MG tablet Take 1 tablet (50 mg total) by mouth every 6 (six) hours as  needed. (Patient not taking: Reported on 10/08/2016) 30 tablet 0   No current facility-administered medications for this visit.    Facility-Administered Medications Ordered in Other Visits  Medication Dose Route Frequency Provider Last Rate Last Dose  . sodium chloride 0.9 % injection 10 mL  10 mL Intracatheter PRN Johney Maine, MD   10 mL at 02/07/15 0930    Review of Systems General: fatigue and weakness  HEENT: voice changes and epitaxis  Lungs: negative for  SOB or cough  Cardiac: no complaints  GI: N/V, constipation/diarrhea due to chemotherapy  GU: no complaints  Musculoskeletal: no complaints  Extremities: no complaints  Skin: no complaints  Neuro: peripheral neuropathy  Endocrine: no complaints  Psych: no complaints       Objective:  Physical Examination:  BP 124/74   Pulse 85   Temp 97.8 F (36.6 C) (Tympanic)   Resp 18   Ht 5' 3.5" (1.613 m)   Wt 186 lb 11.7 oz (84.7 kg)   BMI 32.56 kg/m . Weight is stable.    ECOG Performance Status: 0 - Asymptomatic  Physical Exam  Constitutional: She appears well-developed and well-nourished.  HENT:  Head: Normocephalic and atraumatic.  Eyes: Conjunctivae are normal.  Abdominal: Soft. Bowel sounds are normal. She exhibits no distension and no mass. No hernia.  Musculoskeletal: She exhibits no edema.  Lymphadenopathy:    She has no cervical adenopathy.  Neurological: She is alert. No cranial nerve deficit.  Skin: Skin is warm and dry.  Psychiatric: She has a normal mood and affect. Her behavior is normal.   Lymphadenopathy:    No supraclavicular or inguinal adenopathy.   Pelvic exam; Chaperoned by nurse: Pelvic: Vulva: normal appearing vulva with no masses, tenderness or lesions; Vagina: normal (10-15 mm lesion on the right upper vaginal lateral wall appreciated today); there also appears to be a left paravaginal cyst approximately mid portion of the vagina, on BME the left paravaginal cyst was stable; BME: negative  for masses or nodularity beyond the vaginal cuff; Uterus and Cervix: surgically absent; Rectal: confirms. Possibly a deep 1.5 cm nodule was palpated near right vaginal fornix.   Lab Review Lab Results  Component Value Date   CA125 101.7 (H) 09/24/2016   CA125 64.5 (H) 08/15/2016   CA125 50.4 (H) 06/27/2016    Radiologic Imaging: none    Assessment:  Jamie Conner is a 76 y.o. female diagnosed with optimally debulked stage IIIc  high-grade serous ovarian cancer s/p chemotherapy with complete response. Recurrent ovarian cancer diagnosed 01/10/2016.  ATM mutation. Extensive family history of cancer (breast, pancreatic, throat, gastric, adenosarcoma of the chest, carcinoid tumor of the small bowel) in several first degree relatives.  Vaginal lesion, granulation tissue.  Recurrent platinum-sensitive ovarian cancer with stable disease on PLD/carboplatin based on RECIST criteria with ultimate progression. Platinum-resistant disease initiated on paclitaxel and bevacizumab.   Plan:   Problem List Items Addressed This Visit      Genitourinary   Malignant neoplasm of ovary ([place]) - Primary (Chronic)     Continue current therapy with Dr. Rogue Bussing. We will continue to follow closely with repeat exam in 2 months to follow up the vaginal lesion.   I encouraged her to have her family undergo genetic counseling for ATM mutation carrier assessment and discussed with her daughter today.  HRD testing still pending. If HRD + she may be eligible for PARPi if she develops progressive disease. If insurance will not provide she may be able to get access via a clinical trial. We discussed NIH/NCI trials as well as [facility] studies. We also discussed novel therapies such as immunotherapy which may be an option on a clinical trial. As a single agent immunotherapies have not had impressive response rates in an unselected patient population.  Gillis Ends, MD    CC:  Blain Pais, MD.

## 2016-10-11 ENCOUNTER — Other Ambulatory Visit: Payer: Self-pay | Admitting: Oncology

## 2016-10-11 ENCOUNTER — Telehealth: Payer: Self-pay | Admitting: *Deleted

## 2016-10-11 ENCOUNTER — Encounter: Payer: Self-pay | Admitting: *Deleted

## 2016-10-11 DIAGNOSIS — Z8543 Personal history of malignant neoplasm of ovary: Secondary | ICD-10-CM | POA: Diagnosis not present

## 2016-10-11 DIAGNOSIS — C569 Malignant neoplasm of unspecified ovary: Secondary | ICD-10-CM

## 2016-10-11 NOTE — Telephone Encounter (Signed)
2nd phone call in the last 24 hrs to obtain results for patient's [facility] testing at  Phone: [phone].  representative stated that [facility] has left multiple msgs with pathology to request pathology slides and has not had the slides shipped. She also states that the updated test form was never received by [facility]. I explained that I have a fax confirmation of this form. She states that this was still not received. I verified the fax numbers of the dept.  Documented re-submitted via secure fax at 1 [phone] and also to secondary fax at  Phone: [phone] attn: Colletta [place].  Call placed to [facility] (pt's case coordinator at [facility]) at [phone] to confirm that order forms were received.

## 2016-10-11 NOTE — Progress Notes (Signed)
Jamie Conner [place]: [place] Case ID: [id number] - Rx #: [id number] .  Prior auth performed on pt's oral zofran. Pending response from patient's insurance. 10/11/16 at 1740

## 2016-10-12 ENCOUNTER — Telehealth: Payer: Self-pay | Admitting: *Deleted

## 2016-10-12 NOTE — Telephone Encounter (Signed)
Prior auth for ondansetron 4mg  tablets has been approved for 10/11/2016 - 10/10/2116.

## 2016-10-12 NOTE — Telephone Encounter (Signed)
Thank you for looking into this. I will make Dr. Theora Gianotti aware.

## 2016-10-15 ENCOUNTER — Ambulatory Visit: Payer: PPO

## 2016-10-15 ENCOUNTER — Other Ambulatory Visit: Payer: PPO

## 2016-10-16 ENCOUNTER — Telehealth: Payer: Self-pay | Admitting: *Deleted

## 2016-10-16 ENCOUNTER — Encounter: Payer: Self-pay | Admitting: *Deleted

## 2016-10-16 ENCOUNTER — Other Ambulatory Visit: Payer: Self-pay | Admitting: *Deleted

## 2016-10-16 DIAGNOSIS — C569 Malignant neoplasm of unspecified ovary: Secondary | ICD-10-CM

## 2016-10-16 MED ORDER — ONDANSETRON HCL 4 MG PO TABS: 4.0000 mg | ORAL_TABLET | Freq: Three times a day (TID) | ORAL | 3 refills | Status: DC | PRN

## 2016-10-16 NOTE — Telephone Encounter (Signed)
Faxed [facility] form- coverage determination to [facility]. I also personally contacted [facility]. I explained that patient already received approval for zofran drug coverage. The PA was completed via cover my meds. Pt has already picked up rx and cancer ctr received a phone call last week regarding approval of drug. Per [facility], pt's insurance will not cover the Zofran every 6 hrs prn pain. Will needs every 8 hrs. Max quantity per month is 30 per insurance. Insurance requesting a new rx to be sent to pt's pharmacy that states zofran every 8 hrs.

## 2016-10-16 NOTE — Telephone Encounter (Signed)
Asking for a return call regarding time sensitive PA please call back to [phone] ref # [id number]

## 2016-10-19 ENCOUNTER — Telehealth: Payer: Self-pay | Admitting: *Deleted

## 2016-10-19 NOTE — Telephone Encounter (Signed)
Spoke with patient. [facility] negative for braca 1 and braca 2 on tumor specimen. Patient aware.

## 2016-10-22 ENCOUNTER — Inpatient Hospital Stay: Payer: PPO | Attending: Internal Medicine

## 2016-10-22 ENCOUNTER — Inpatient Hospital Stay: Payer: PPO

## 2016-10-22 ENCOUNTER — Inpatient Hospital Stay (HOSPITAL_BASED_OUTPATIENT_CLINIC_OR_DEPARTMENT_OTHER): Payer: PPO | Admitting: Internal Medicine

## 2016-10-22 VITALS — BP 122/82 | HR 81 | Temp 96.9°F | Wt 187.0 lb

## 2016-10-22 DIAGNOSIS — C569 Malignant neoplasm of unspecified ovary: Secondary | ICD-10-CM

## 2016-10-22 DIAGNOSIS — Z9221 Personal history of antineoplastic chemotherapy: Secondary | ICD-10-CM

## 2016-10-22 DIAGNOSIS — Z803 Family history of malignant neoplasm of breast: Secondary | ICD-10-CM | POA: Diagnosis not present

## 2016-10-22 DIAGNOSIS — Z853 Personal history of malignant neoplasm of breast: Secondary | ICD-10-CM | POA: Insufficient documentation

## 2016-10-22 DIAGNOSIS — E785 Hyperlipidemia, unspecified: Secondary | ICD-10-CM

## 2016-10-22 DIAGNOSIS — I340 Nonrheumatic mitral (valve) insufficiency: Secondary | ICD-10-CM | POA: Insufficient documentation

## 2016-10-22 DIAGNOSIS — Z800 Family history of malignant neoplasm of digestive organs: Secondary | ICD-10-CM

## 2016-10-22 DIAGNOSIS — K219 Gastro-esophageal reflux disease without esophagitis: Secondary | ICD-10-CM

## 2016-10-22 DIAGNOSIS — I4891 Unspecified atrial fibrillation: Secondary | ICD-10-CM | POA: Insufficient documentation

## 2016-10-22 DIAGNOSIS — J069 Acute upper respiratory infection, unspecified: Secondary | ICD-10-CM | POA: Insufficient documentation

## 2016-10-22 DIAGNOSIS — I700 Atherosclerosis of aorta: Secondary | ICD-10-CM | POA: Insufficient documentation

## 2016-10-22 DIAGNOSIS — Z808 Family history of malignant neoplasm of other organs or systems: Secondary | ICD-10-CM | POA: Diagnosis not present

## 2016-10-22 DIAGNOSIS — Z90722 Acquired absence of ovaries, bilateral: Secondary | ICD-10-CM | POA: Insufficient documentation

## 2016-10-22 DIAGNOSIS — D709 Neutropenia, unspecified: Secondary | ICD-10-CM | POA: Diagnosis not present

## 2016-10-22 DIAGNOSIS — Z90710 Acquired absence of both cervix and uterus: Secondary | ICD-10-CM | POA: Diagnosis not present

## 2016-10-22 DIAGNOSIS — K5730 Diverticulosis of large intestine without perforation or abscess without bleeding: Secondary | ICD-10-CM | POA: Diagnosis not present

## 2016-10-22 DIAGNOSIS — E039 Hypothyroidism, unspecified: Secondary | ICD-10-CM | POA: Diagnosis not present

## 2016-10-22 DIAGNOSIS — C786 Secondary malignant neoplasm of retroperitoneum and peritoneum: Secondary | ICD-10-CM | POA: Diagnosis not present

## 2016-10-22 DIAGNOSIS — Z5111 Encounter for antineoplastic chemotherapy: Secondary | ICD-10-CM | POA: Diagnosis not present

## 2016-10-22 DIAGNOSIS — Z923 Personal history of irradiation: Secondary | ICD-10-CM

## 2016-10-22 DIAGNOSIS — I493 Ventricular premature depolarization: Secondary | ICD-10-CM | POA: Insufficient documentation

## 2016-10-22 LAB — CBC WITH DIFFERENTIAL/PLATELET
BASOS PCT: 1 %
Basophils Absolute: 0 10*3/uL (ref 0–0.1)
EOS ABS: 0 10*3/uL (ref 0–0.7)
Eosinophils Relative: 1 %
HEMATOCRIT: 36.7 % (ref 35.0–47.0)
Hemoglobin: 12.7 g/dL (ref 12.0–16.0)
Lymphocytes Relative: 57 %
Lymphs Abs: 2 10*3/uL (ref 1.0–3.6)
MCH: 34.5 pg — ABNORMAL HIGH (ref 26.0–34.0)
MCHC: 34.7 g/dL (ref 32.0–36.0)
MCV: 99.6 fL (ref 80.0–100.0)
MONO ABS: 0.6 10*3/uL (ref 0.2–0.9)
MONOS PCT: 17 %
NEUTROS ABS: 0.8 10*3/uL — AB (ref 1.4–6.5)
Neutrophils Relative %: 24 %
PLATELETS: 265 10*3/uL (ref 150–440)
RBC: 3.69 MIL/uL — ABNORMAL LOW (ref 3.80–5.20)
RDW: 14.6 % — AB (ref 11.5–14.5)
WBC: 3.4 10*3/uL — ABNORMAL LOW (ref 3.6–11.0)

## 2016-10-22 LAB — COMPREHENSIVE METABOLIC PANEL
ALT: 17 U/L (ref 14–54)
ANION GAP: 4 — AB (ref 5–15)
AST: 23 U/L (ref 15–41)
Albumin: 3.6 g/dL (ref 3.5–5.0)
Alkaline Phosphatase: 77 U/L (ref 38–126)
BUN: 16 mg/dL (ref 6–20)
CHLORIDE: 106 mmol/L (ref 101–111)
CO2: 24 mmol/L (ref 22–32)
Calcium: 9 mg/dL (ref 8.9–10.3)
Creatinine, Ser: 0.87 mg/dL (ref 0.44–1.00)
Glucose, Bld: 91 mg/dL (ref 65–99)
POTASSIUM: 3.9 mmol/L (ref 3.5–5.1)
SODIUM: 134 mmol/L — AB (ref 135–145)
Total Bilirubin: 0.5 mg/dL (ref 0.3–1.2)
Total Protein: 6.7 g/dL (ref 6.5–8.1)

## 2016-10-22 MED ORDER — HEPARIN SOD (PORK) LOCK FLUSH 100 UNIT/ML IV SOLN
INTRAVENOUS | Status: AC
  Filled 2016-10-22: qty 5

## 2016-10-22 MED ORDER — HEPARIN SOD (PORK) LOCK FLUSH 100 UNIT/ML IV SOLN
500.0000 [IU] | Freq: Once | INTRAVENOUS | Status: AC
  Administered 2016-10-22: 500 [IU] via INTRAVENOUS

## 2016-10-22 MED ORDER — SODIUM CHLORIDE 0.9% FLUSH
10.0000 mL | INTRAVENOUS | Status: DC | PRN
  Administered 2016-10-22: 10 mL via INTRAVENOUS
  Filled 2016-10-22: qty 10

## 2016-10-22 NOTE — Progress Notes (Signed)
Jamie Conner OFFICE PROGRESS NOTE  Patient Care Team: Jamie Conner., MD as PCP - General (Family Medicine) [facility] Jamie Michaelis, MD as Referring Physician (Obstetrics and Gynecology) Robert Bellow, MD as Consulting Physician (General Surgery)  Ovarian cancer [facility]   Staging form: Ovary, AJCC 7th Edition     Clinical: Stage IIIC (T3c, N0, M0) - Unsigned    Oncology History   # April- MAY 2016- OVARIAN CANCER STAGE IIIC; CA-125 +2300+;  s/p OPTIMAL DEBULKING SURGERY   # MAY 2016[name] DD [finished in Sep 19th 2017]  # MAY CT 2017- RECURRENT/Peritoneal carcinomatosis/pelvic implant ~1.2cm/Ca-125-34;   # July 3rd- CARBO-DOXIL q 4W [with neulasta]; CT May 11 2016- slight increase peritoneal / stable nodules. Cont chemo [finished DEC 2017]. DEC 28th 2017 CT- Increase in peritoneal deposits; increasing Ca 125 [60s]  # G-1-2 hand foot syndrome-   # LEFT BREAST CA s/p Lumpec & RT s/p TAM   # Afib [cardiology]; JUNE 2017- MUGA 62%  MOLECULAR TESTING- ATM genetic mutation; TUMOR BRCA- NEG; Myriad genomic instability- NEGATIVE     Ovary cancer, unspecified laterality ([place])   12/16/2014 Initial Diagnosis    Ovarian cancer       INTERVAL HISTORY:  76 year old female patient with above history of ovarian cancer recurrent Platinum Refractory ovarian cancer [based on CT scan in December 2017]- is here for follow-up; Patient is currently on Avastin and Taxol. Currently status post cycle #1 chemo.   Denies any significant nausea or vomiting. Denies any significant tingling or numbness.  Denies any fatigue. Denies any abdominal pain. Denies any abdominal distention. No chest pain or shortness of breath or cough. No epistaxis. No headaches. Mild hair loss.  REVIEW OF SYSTEMS:  A complete 10 point review of system is done which is negative except mentioned above/history of present illness.   PAST MEDICAL HISTORY :  Past Medical History:  Diagnosis Date   . Atrial fibrillation ([place])   . Cancer of breast ([place]) 1992   Left  . CINV (chemotherapy-induced nausea and vomiting)   . GERD (gastroesophageal reflux disease)   . Hyperlipidemia   . Hypothyroidism   . Mitral insufficiency   . Ovarian cancer [facility])    s/p BSO optimal tumor debulking May 2016  . PVC (premature ventricular contraction)     PAST SURGICAL HISTORY :   Past Surgical History:  Procedure Laterality Date  . ABDOMINAL HYSTERECTOMY    . BILATERAL SALPINGOOPHORECTOMY  May 2016   with optimal tumor debulking   . BREAST SURGERY     Left  . CESAREAN SECTION     x2  . CHOLECYSTECTOMY    . COLONOSCOPY  2006  . DEBULKING N/A 12/22/2014   Procedure: DEBULKING;  Surgeon: Gillis Ends, MD;  Location: [facility] ORS;  Service: Gynecology;  Laterality: N/A;  . LAPAROTOMY N/A 12/22/2014   Procedure: EXPLORATORY LAPAROTOMY;  Surgeon: Robert Bellow, MD;  Location: [facility] ORS;  Service: General;  Laterality: N/A;  . LAPAROTOMY WITH STAGING N/A 12/22/2014   Procedure: LAPAROTOMY WITH STAGING;  Surgeon: Gillis Ends, MD;  Location: [facility] OR[facility];  Service: Gynecology;  Laterality: N/A;  . PERIPHERAL VASCULAR CATHETERIZATION Left 12/22/2014   Procedure: PORTA CATH INSERTION;  Surgeon: Robert Bellow, MD;  Location: [facility] ORS;  Service: General;  Laterality: Left;  . PORTACATH PLACEMENT Right 12/22/2014   Procedure: INSERTION PORT-A-CATH;  Surgeon: Robert Bellow, MD;  Location: [facility] OR[facility];  Service: General;  Laterality: Right;  . SALPINGOOPHORECTOMY Bilateral  12/22/2014   Procedure: SALPINGO OOPHORECTOMY;  Surgeon: Gillis Ends, MD;  Location: [facility] OR[facility];  Service: Gynecology;  Laterality: Bilateral;  . UPPER GI ENDOSCOPY  09/25/04   hiatus hernia, schatzki ring and a single gastric polyp    FAMILY HISTORY :   Family History  Problem Relation Age of Onset  . Cancer Mother     breast, throat, and stomach  . Breast cancer Mother   . Heart disease Father   . Pulmonary  embolism Sister   . Cancer Brother 60    angiosarcoma of the chest; carcinoid small intestinal tumor  . Hypertension Brother   . Cancer Maternal Aunt     breast cancer  . Cancer Maternal Grandfather 45    pancreatic    SOCIAL HISTORY:   Social History  Substance Use Topics  . Smoking status: Never Smoker  . Smokeless tobacco: Never Used  . Alcohol use No    ALLERGIES:  is allergic to carafate [sucralfate] and sulfa antibiotics.  MEDICATIONS:  Current Outpatient Prescriptions  Medication Sig Dispense Refill  . acetaminophen (TYLENOL) 500 MG chewable tablet Chew 500 mg by mouth every 6 (six) hours as needed for pain.    . chlorhexidine (PERIDEX) 0.12 % solution Use as directed 5 mLs in the mouth or throat as needed.     . Cholecalciferol (VITAMIN D) 2000 units tablet Take 1 tablet (2,000 Units total) by mouth daily. 30 tablet 12  . lactulose (CHRONULAC) 10 GM/15ML solution TAKE 15MLS (10G TOTAL) BY MOUTH THREE TIMES A DAY AS DIRECTED BY PHYSICIAN. 240 mL 1  . levothyroxine (SYNTHROID, LEVOTHROID) 50 MCG tablet Take 1 tablet (50 mcg total) by mouth daily. 30 tablet 12  . lidocaine-prilocaine (EMLA) cream Apply to port area 30-45 mins prior to chemo. 30 g 5  . LORazepam (ATIVAN) 0.5 MG tablet Take 0.5 tablets (0.25 mg total) by mouth at bedtime. 30 tablet 0  . Multiple Vitamin (MULTIVITAMIN) capsule Take 1 capsule by mouth daily.    . ondansetron (ZOFRAN) 4 MG tablet Take 1 tablet (4 mg total) by mouth every 8 (eight) hours as needed for nausea or vomiting. 30 tablet 3  . pantoprazole (PROTONIX) 40 MG tablet Take 1 tablet (40 mg total) by mouth daily. 30 tablet 12  . polyethylene glycol (MIRALAX / GLYCOLAX) packet Take 17 g by mouth daily. 30 each 4  . prochlorperazine (COMPAZINE) 10 MG tablet Take 1 tablet (10 mg total) by mouth every 6 (six) hours as needed for nausea or vomiting. 30 tablet 0  . propafenone (RYTHMOL) 225 MG tablet Take 225 mg by mouth 2 (two) times daily.    .  traMADol (ULTRAM) 50 MG tablet Take 1 tablet (50 mg total) by mouth every 6 (six) hours as needed. (Patient not taking: Reported on 10/22/2016) 30 tablet 0   No current facility-administered medications for this visit.    Facility-Administered Medications Ordered in Other Visits  Medication Dose Route Frequency Provider Last Rate Last Dose  . sodium chloride 0.9 % injection 10 mL  10 mL Intracatheter PRN Forest Gleason, MD   10 mL at 02/07/15 0930    PHYSICAL EXAMINATION: ECOG PERFORMANCE STATUS: 0 - Asymptomatic  BP 122/82 (BP Location: Left Arm, Patient Position: Sitting)   Pulse 81   Temp (!) 96.9 F (36.1 C) (Tympanic)   Wt 187 lb (84.8 kg)   BMI 32.61 kg/m   Filed Weights   10/22/16 0849  Weight: 187 lb (84.8 kg)    GENERAL:  Well-nourished well-developed; Alert, no distress and comfortAccompanied by her husband. EYES: no pallor or icterus OROPHARYNX: no thrush or ulceration; good dentition  NECK: supple, no masses felt LYMPH:  no palpable lymphadenopathy in the cervical, axillary or inguinal regions LUNGS: clear to auscultation and  No wheeze or crackles HEART/CVS: regular rate & rhythm and no murmurs; No lower extremity edema ABDOMEN:abdomen soft, non-tender and normal bowel sounds Musculoskeletal:no cyanosis of digits and no clubbing  PSYCH: alert & oriented x 3 with fluent speech NEURO: no focal motor/sensory deficits SKIN:  Normal. No rash noted.   LABORATORY DATA:  I have reviewed the data as listed    Component Value Date/Time   NA 134 (L) 10/22/2016 0826   NA 140 12/14/2014 1037   K 3.9 10/22/2016 0826   K 4.4 12/14/2014 1037   CL 106 10/22/2016 0826   CL 106 12/14/2014 1037   CO2 24 10/22/2016 0826   CO2 26 12/14/2014 1037   GLUCOSE 91 10/22/2016 0826   GLUCOSE 87 12/14/2014 1037   BUN 16 10/22/2016 0826   BUN 12 12/14/2014 1037   CREATININE 0.87 10/22/2016 0826   CREATININE 0.97 12/14/2014 1037   CALCIUM 9.0 10/22/2016 0826   CALCIUM 9.2 12/14/2014  1037   PROT 6.7 10/22/2016 0826   PROT 7.3 12/14/2014 1037   ALBUMIN 3.6 10/22/2016 0826   ALBUMIN 3.8 12/14/2014 1037   AST 23 10/22/2016 0826   AST 20 12/14/2014 1037   ALT 17 10/22/2016 0826   ALT 11 (L) 12/14/2014 1037   ALKPHOS 77 10/22/2016 0826   ALKPHOS 138 (H) 12/14/2014 1037   BILITOT 0.5 10/22/2016 0826   BILITOT 0.5 12/14/2014 1037   GFRNONAA >60 10/22/2016 0826   GFRNONAA 59 (L) 12/14/2014 1037   GFRAA >60 10/22/2016 0826   GFRAA >60 12/14/2014 1037    No results found for: SPEP, UPEP  Lab Results  Component Value Date   WBC 3.4 (L) 10/22/2016   NEUTROABS 0.8 (L) 10/22/2016   HGB 12.7 10/22/2016   HCT 36.7 10/22/2016   MCV 99.6 10/22/2016   PLT 265 10/22/2016      Chemistry      Component Value Date/Time   NA 134 (L) 10/22/2016 0826   NA 140 12/14/2014 1037   K 3.9 10/22/2016 0826   K 4.4 12/14/2014 1037   CL 106 10/22/2016 0826   CL 106 12/14/2014 1037   CO2 24 10/22/2016 0826   CO2 26 12/14/2014 1037   BUN 16 10/22/2016 0826   BUN 12 12/14/2014 1037   CREATININE 0.87 10/22/2016 0826   CREATININE 0.97 12/14/2014 1037   GLU 84 03/16/2014      Component Value Date/Time   CALCIUM 9.0 10/22/2016 0826   CALCIUM 9.2 12/14/2014 1037   ALKPHOS 77 10/22/2016 0826   ALKPHOS 138 (H) 12/14/2014 1037   AST 23 10/22/2016 0826   AST 20 12/14/2014 1037   ALT 17 10/22/2016 0826   ALT 11 (L) 12/14/2014 1037   BILITOT 0.5 10/22/2016 0826   BILITOT 0.5 12/14/2014 1037    IMPRESSION: 1. Today's study demonstrates slight interval growth of several peritoneal implants in the low anatomic pelvis indicative of slight progression of disease. No other new peritoneal deposits are noted, and there is no other metastatic disease noted elsewhere in the chest, abdomen or pelvis. 2. Colonic diverticulosis without evidence of acute diverticulitis at this time. 3. Aortic atherosclerosis, in addition to left main coronary artery disease. Assessment for potential risk  factor modification, dietary  therapy or pharmacologic therapy may be warranted, if clinically indicated. 4. Additional incidental findings, as above.   Electronically Signed   By: Vinnie Langton M.D.   On: 08/15/2016 14:08   RADIOGRAPHIC STUDIES: I have personally reviewed the radiological images as listed and agreed with the findings in the report. No results found.   ASSESSMENT & PLAN:  Ovary cancer, unspecified laterality ([place]) Recurrent  Platinum refractory ovarian cancer [dec 2017] slight increase in the CA-125.DEC 2017-  CT scan shows slight increase in the peritoneal deposits in the pelvis. Currently On Taxol- Avastin [day1,8 & 15 every 28 days [off-day 22]; Avastin every 2 weeks. S/p cycle #1. Tolerating chemo well- except for side effects [discussed below].  # HOLD chemo with cycle #2 day 1 today. Labs today reviewed except for- neutropenia [see below].  # Recommend decreasing the dose of Taxol to 60 mg/m  starting next week. If neutropenia continues to be a problem would recommend adding Neupogen.  # Also reviewed the results of the tumor BRCA-negative; also negative for genomic instability. Discussed the patient could still be a candidate for maintenance therapy with PARP inhibitors- lynparza or niraparib after chemo.   # follow up in 2 weeks/ labs-UA; ca 125/ Taxol-Avastin.   No orders of the defined types were placed in this encounter.  All questions were answered. The patient knows to call the clinic with any problems, questions or concerns.      Cammie Sickle, MD 10/24/2016 4:35 PM

## 2016-10-22 NOTE — Progress Notes (Signed)
Patient here today for follow up.   

## 2016-10-22 NOTE — Assessment & Plan Note (Addendum)
Recurrent  Platinum refractory ovarian cancer [dec 2017] slight increase in the CA-125.DEC 2017-  CT scan shows slight increase in the peritoneal deposits in the pelvis. Currently On Taxol- Avastin [day1,8 & 15 every 28 days [off-day 22]; Avastin every 2 weeks. S/p cycle #1. Tolerating chemo well- except for side effects [discussed below].  # HOLD chemo with cycle #2 day 1 today. Labs today reviewed except for- neutropenia [see below].  # Recommend decreasing the dose of Taxol to 60 mg/m starting next week. If neutropenia continues to be a problem would recommend adding Neupogen.  # Also reviewed the results of the tumor BRCA-negative; also negative for genomic instability. Discussed the patient could still be a candidate for maintenance therapy with PARP inhibitors- lynparza or niraparib after chemo.   # follow up in 2 weeks/ labs-UA; ca 125/ Taxol-Avastin.

## 2016-10-23 LAB — CA 125: CA 125: 18.2 U/mL (ref 0.0–38.1)

## 2016-10-29 ENCOUNTER — Inpatient Hospital Stay: Payer: PPO

## 2016-10-29 ENCOUNTER — Other Ambulatory Visit: Payer: Self-pay | Admitting: Internal Medicine

## 2016-10-29 ENCOUNTER — Other Ambulatory Visit: Payer: Self-pay | Admitting: *Deleted

## 2016-10-29 VITALS — BP 137/79 | HR 84 | Temp 97.1°F | Resp 18

## 2016-10-29 DIAGNOSIS — C569 Malignant neoplasm of unspecified ovary: Secondary | ICD-10-CM

## 2016-10-29 DIAGNOSIS — Z5111 Encounter for antineoplastic chemotherapy: Secondary | ICD-10-CM | POA: Diagnosis not present

## 2016-10-29 LAB — URINALYSIS, COMPLETE (UACMP) WITH MICROSCOPIC
BACTERIA UA: NONE SEEN
BILIRUBIN URINE: NEGATIVE
Glucose, UA: NEGATIVE mg/dL
Hgb urine dipstick: NEGATIVE
Ketones, ur: NEGATIVE mg/dL
Leukocytes, UA: NEGATIVE
NITRITE: NEGATIVE
PH: 6 (ref 5.0–8.0)
Protein, ur: NEGATIVE mg/dL
SPECIFIC GRAVITY, URINE: 1.004 — AB (ref 1.005–1.030)
Squamous Epithelial / HPF: NONE SEEN

## 2016-10-29 LAB — BASIC METABOLIC PANEL
Anion gap: 6 (ref 5–15)
BUN: 16 mg/dL (ref 6–20)
CALCIUM: 9.1 mg/dL (ref 8.9–10.3)
CO2: 26 mmol/L (ref 22–32)
CREATININE: 1.06 mg/dL — AB (ref 0.44–1.00)
Chloride: 106 mmol/L (ref 101–111)
GFR, EST AFRICAN AMERICAN: 59 mL/min — AB (ref >60)
GFR, EST NON AFRICAN AMERICAN: 51 mL/min — AB (ref >60)
Glucose, Bld: 87 mg/dL (ref 65–99)
Potassium: 3.9 mmol/L (ref 3.5–5.1)
SODIUM: 138 mmol/L (ref 135–145)

## 2016-10-29 LAB — CBC WITH DIFFERENTIAL/PLATELET
Basophils Absolute: 0.1 10*3/uL (ref 0–0.1)
Basophils Relative: 1 %
EOS ABS: 0.1 10*3/uL (ref 0–0.7)
EOS PCT: 2 %
HEMATOCRIT: 37.7 % (ref 35.0–47.0)
Hemoglobin: 13 g/dL (ref 12.0–16.0)
Lymphocytes Relative: 46 %
Lymphs Abs: 2.1 10*3/uL (ref 1.0–3.6)
MCH: 34 pg (ref 26.0–34.0)
MCHC: 34.4 g/dL (ref 32.0–36.0)
MCV: 98.8 fL (ref 80.0–100.0)
MONO ABS: 0.7 10*3/uL (ref 0.2–0.9)
Monocytes Relative: 16 %
Neutro Abs: 1.6 10*3/uL (ref 1.4–6.5)
Neutrophils Relative %: 35 %
PLATELETS: 256 10*3/uL (ref 150–440)
RBC: 3.82 MIL/uL (ref 3.80–5.20)
RDW: 14.1 % (ref 11.5–14.5)
WBC: 4.6 10*3/uL (ref 3.6–11.0)

## 2016-10-29 MED ORDER — SODIUM CHLORIDE 0.9 % IV SOLN
60.0000 mg/m2 | Freq: Once | INTRAVENOUS | Status: AC
  Administered 2016-10-29: 114 mg via INTRAVENOUS
  Filled 2016-10-29: qty 19

## 2016-10-29 MED ORDER — SODIUM CHLORIDE 0.9 % IV SOLN
Freq: Once | INTRAVENOUS | Status: AC
  Administered 2016-10-29: 11:00:00 via INTRAVENOUS
  Filled 2016-10-29: qty 1000

## 2016-10-29 MED ORDER — SODIUM CHLORIDE 0.9 % IV SOLN
20.0000 mg | Freq: Once | INTRAVENOUS | Status: AC
Start: 2016-10-29 — End: 2016-10-29
  Administered 2016-10-29: 20 mg via INTRAVENOUS
  Filled 2016-10-29: qty 2

## 2016-10-29 MED ORDER — SODIUM CHLORIDE 0.9% FLUSH
10.0000 mL | INTRAVENOUS | Status: DC | PRN
  Administered 2016-10-29: 10 mL via INTRAVENOUS
  Filled 2016-10-29: qty 10

## 2016-10-29 MED ORDER — DIPHENHYDRAMINE HCL 50 MG/ML IJ SOLN
25.0000 mg | Freq: Once | INTRAMUSCULAR | Status: AC
  Administered 2016-10-29: 25 mg via INTRAVENOUS
  Filled 2016-10-29: qty 1

## 2016-10-29 MED ORDER — FAMOTIDINE IN NACL 20-0.9 MG/50ML-% IV SOLN
20.0000 mg | Freq: Once | INTRAVENOUS | Status: AC
  Administered 2016-10-29: 20 mg via INTRAVENOUS
  Filled 2016-10-29: qty 50

## 2016-10-29 MED ORDER — HEPARIN SOD (PORK) LOCK FLUSH 100 UNIT/ML IV SOLN
500.0000 [IU] | Freq: Once | INTRAVENOUS | Status: AC
  Administered 2016-10-29: 500 [IU] via INTRAVENOUS
  Filled 2016-10-29: qty 5

## 2016-10-29 MED ORDER — SODIUM CHLORIDE 0.9 % IV SOLN
800.0000 mg | Freq: Once | INTRAVENOUS | Status: AC
  Administered 2016-10-29: 800 mg via INTRAVENOUS
  Filled 2016-10-29: qty 32

## 2016-11-05 ENCOUNTER — Inpatient Hospital Stay (HOSPITAL_BASED_OUTPATIENT_CLINIC_OR_DEPARTMENT_OTHER): Payer: PPO | Admitting: Internal Medicine

## 2016-11-05 ENCOUNTER — Inpatient Hospital Stay: Payer: PPO

## 2016-11-05 VITALS — BP 135/73 | HR 85 | Temp 97.6°F | Resp 20 | Ht 63.5 in | Wt 187.0 lb

## 2016-11-05 DIAGNOSIS — J069 Acute upper respiratory infection, unspecified: Secondary | ICD-10-CM

## 2016-11-05 DIAGNOSIS — E039 Hypothyroidism, unspecified: Secondary | ICD-10-CM

## 2016-11-05 DIAGNOSIS — I340 Nonrheumatic mitral (valve) insufficiency: Secondary | ICD-10-CM

## 2016-11-05 DIAGNOSIS — K5730 Diverticulosis of large intestine without perforation or abscess without bleeding: Secondary | ICD-10-CM | POA: Diagnosis not present

## 2016-11-05 DIAGNOSIS — Z5111 Encounter for antineoplastic chemotherapy: Secondary | ICD-10-CM | POA: Diagnosis not present

## 2016-11-05 DIAGNOSIS — Z800 Family history of malignant neoplasm of digestive organs: Secondary | ICD-10-CM

## 2016-11-05 DIAGNOSIS — I700 Atherosclerosis of aorta: Secondary | ICD-10-CM

## 2016-11-05 DIAGNOSIS — Z9221 Personal history of antineoplastic chemotherapy: Secondary | ICD-10-CM

## 2016-11-05 DIAGNOSIS — Z90722 Acquired absence of ovaries, bilateral: Secondary | ICD-10-CM

## 2016-11-05 DIAGNOSIS — I493 Ventricular premature depolarization: Secondary | ICD-10-CM | POA: Diagnosis not present

## 2016-11-05 DIAGNOSIS — D709 Neutropenia, unspecified: Secondary | ICD-10-CM | POA: Diagnosis not present

## 2016-11-05 DIAGNOSIS — C786 Secondary malignant neoplasm of retroperitoneum and peritoneum: Secondary | ICD-10-CM

## 2016-11-05 DIAGNOSIS — Z853 Personal history of malignant neoplasm of breast: Secondary | ICD-10-CM

## 2016-11-05 DIAGNOSIS — K219 Gastro-esophageal reflux disease without esophagitis: Secondary | ICD-10-CM

## 2016-11-05 DIAGNOSIS — C569 Malignant neoplasm of unspecified ovary: Secondary | ICD-10-CM

## 2016-11-05 DIAGNOSIS — E785 Hyperlipidemia, unspecified: Secondary | ICD-10-CM

## 2016-11-05 DIAGNOSIS — Z90710 Acquired absence of both cervix and uterus: Secondary | ICD-10-CM

## 2016-11-05 DIAGNOSIS — I4891 Unspecified atrial fibrillation: Secondary | ICD-10-CM | POA: Diagnosis not present

## 2016-11-05 DIAGNOSIS — Z803 Family history of malignant neoplasm of breast: Secondary | ICD-10-CM

## 2016-11-05 DIAGNOSIS — Z808 Family history of malignant neoplasm of other organs or systems: Secondary | ICD-10-CM

## 2016-11-05 DIAGNOSIS — Z923 Personal history of irradiation: Secondary | ICD-10-CM

## 2016-11-05 LAB — CBC WITH DIFFERENTIAL/PLATELET
BASOS PCT: 1 %
Basophils Absolute: 0 10*3/uL (ref 0–0.1)
EOS ABS: 0.1 10*3/uL (ref 0–0.7)
Eosinophils Relative: 3 %
HCT: 36.7 % (ref 35.0–47.0)
HEMOGLOBIN: 12.6 g/dL (ref 12.0–16.0)
LYMPHS ABS: 2.3 10*3/uL (ref 1.0–3.6)
Lymphocytes Relative: 50 %
MCH: 33.5 pg (ref 26.0–34.0)
MCHC: 34.2 g/dL (ref 32.0–36.0)
MCV: 98 fL (ref 80.0–100.0)
Monocytes Absolute: 0.4 10*3/uL (ref 0.2–0.9)
Monocytes Relative: 10 %
NEUTROS PCT: 38 %
Neutro Abs: 1.7 10*3/uL (ref 1.4–6.5)
Platelets: 217 10*3/uL (ref 150–440)
RBC: 3.75 MIL/uL — AB (ref 3.80–5.20)
RDW: 14.1 % (ref 11.5–14.5)
WBC: 4.5 10*3/uL (ref 3.6–11.0)

## 2016-11-05 LAB — COMPREHENSIVE METABOLIC PANEL
ALBUMIN: 3.5 g/dL (ref 3.5–5.0)
ALK PHOS: 64 U/L (ref 38–126)
ALT: 16 U/L (ref 14–54)
AST: 26 U/L (ref 15–41)
Anion gap: 7 (ref 5–15)
BUN: 15 mg/dL (ref 6–20)
CALCIUM: 9.2 mg/dL (ref 8.9–10.3)
CO2: 24 mmol/L (ref 22–32)
CREATININE: 0.94 mg/dL (ref 0.44–1.00)
Chloride: 107 mmol/L (ref 101–111)
GFR calc Af Amer: >60 mL/min (ref >60)
GFR calc non Af Amer: 59 mL/min — ABNORMAL LOW (ref >60)
GLUCOSE: 105 mg/dL — AB (ref 65–99)
Potassium: 4.1 mmol/L (ref 3.5–5.1)
SODIUM: 138 mmol/L (ref 135–145)
Total Bilirubin: 0.5 mg/dL (ref 0.3–1.2)
Total Protein: 6.4 g/dL — ABNORMAL LOW (ref 6.5–8.1)

## 2016-11-05 MED ORDER — SODIUM CHLORIDE 0.9 % IV SOLN
20.0000 mg | Freq: Once | INTRAVENOUS | Status: AC
  Administered 2016-11-05: 20 mg via INTRAVENOUS
  Filled 2016-11-05: qty 2

## 2016-11-05 MED ORDER — DIPHENHYDRAMINE HCL 50 MG/ML IJ SOLN
25.0000 mg | Freq: Once | INTRAMUSCULAR | Status: AC
  Administered 2016-11-05: 25 mg via INTRAVENOUS
  Filled 2016-11-05: qty 1

## 2016-11-05 MED ORDER — HEPARIN SOD (PORK) LOCK FLUSH 100 UNIT/ML IV SOLN
INTRAVENOUS | Status: AC
  Filled 2016-11-05: qty 5

## 2016-11-05 MED ORDER — HEPARIN SOD (PORK) LOCK FLUSH 100 UNIT/ML IV SOLN
500.0000 [IU] | Freq: Once | INTRAVENOUS | Status: AC
  Administered 2016-11-05: 500 [IU] via INTRAVENOUS

## 2016-11-05 MED ORDER — SODIUM CHLORIDE 0.9 % IV SOLN
60.0000 mg/m2 | Freq: Once | INTRAVENOUS | Status: AC
  Administered 2016-11-05: 114 mg via INTRAVENOUS
  Filled 2016-11-05: qty 19

## 2016-11-05 MED ORDER — SODIUM CHLORIDE 0.9 % IV SOLN
Freq: Once | INTRAVENOUS | Status: AC
  Administered 2016-11-05: 09:00:00 via INTRAVENOUS
  Filled 2016-11-05: qty 1000

## 2016-11-05 MED ORDER — SODIUM CHLORIDE 0.9% FLUSH
10.0000 mL | INTRAVENOUS | Status: DC | PRN
  Administered 2016-11-05: 10 mL via INTRAVENOUS
  Filled 2016-11-05: qty 10

## 2016-11-05 MED ORDER — FAMOTIDINE IN NACL 20-0.9 MG/50ML-% IV SOLN
20.0000 mg | Freq: Once | INTRAVENOUS | Status: AC
  Administered 2016-11-05: 20 mg via INTRAVENOUS
  Filled 2016-11-05: qty 50

## 2016-11-05 NOTE — Assessment & Plan Note (Addendum)
Recurrent  Platinum refractory ovarian cancer [dec 2017] slight increase in the CA-125.DEC 2017-  CT scan shows slight increase in the peritoneal deposits in the pelvis.  # Currently On Taxol- Avastin [day1,8 & 15 every 28 days [off-day 22]; Avastin every 2 weeks. S/p cycle #2; day-1. Ca-125 improving.   # Tolerating chemo well- except for side effects [discussed below; neutropenia]. Dose of taxol reduced to 60 mg/m2.   # chemo neutropenia- resolved; after reducing the dose of taxol to 60mg /m2.   #  URI- recommend benadryl/ flonase  # follow up in 2 weeks/ labs-UA; ca 125/ Taxol-Avastin in 3 weeks.; taxol- avastin in 1 week.

## 2016-11-05 NOTE — Progress Notes (Signed)
[place] OFFICE PROGRESS NOTE  Patient Care Team: Jerrol Banana., MD as PCP - General (Family Medicine) [facility] Gaetana Michaelis, MD as Referring Physician (Obstetrics and Gynecology) Robert Bellow, MD as Consulting Physician (General Surgery)  Ovarian cancer [facility])   Staging form: Ovary, AJCC 7th Edition     Clinical: Stage IIIC (T3c, N0, M0) - Unsigned    Oncology History   # April- MAY 2016- OVARIAN CANCER STAGE IIIC; CA-125 +2300+;  s/p OPTIMAL DEBULKING SURGERY   # MAY 2016[name] [finished in Sep 19th 2017]  # MAY CT 2017- RECURRENT/Peritoneal carcinomatosis/pelvic implant ~1.2cm/Ca-125-34;   # July 3rd- CARBO-DOXIL q 4W [with neulasta]; CT May 11 2016- slight increase peritoneal / stable nodules. Cont chemo [finished DEC 2017]. DEC 28th 2017 CT- Increase in peritoneal deposits; increasing Ca 125 [60s]  # G-1-2 hand foot syndrome-   # LEFT BREAST CA s/p Lumpec & RT s/p TAM   # Afib [cardiology]; JUNE 2017- MUGA 62%  MOLECULAR TESTING- ATM genetic mutation; TUMOR BRCA- NEG; Myriad genomic instability- NEGATIVE     Ovary cancer, unspecified laterality ([place])   12/16/2014 Initial Diagnosis    Ovarian cancer       INTERVAL HISTORY:  75 year old female patient with above history of ovarian cancer recurrent Platinum Refractory ovarian cancer [based on CT scan in December 2017]- is here for follow-up; Patient is currently on Avastin and Taxol. Currently status post cycle #2 day-1. Cycle #2 day one had delayed by one week because of neutropenia.  Denies any significant nausea or vomiting. Denies any significant tingling or numbness.  Denies any fatigue. Denies any abdominal pain. Denies any abdominal distention. No chest pain or shortness of breath or cough.No headaches. Positive for hair loss. She complains of mucus discharge since thestreaks of blood. No frank epistaxis.  REVIEW OF SYSTEMS:  A complete 10 point review of system is done  which is negative except mentioned above/history of present illness.   PAST MEDICAL HISTORY :  Past Medical History:  Diagnosis Date  . Atrial fibrillation ([place])   . Cancer of breast ([place]) 1992   Left  . CINV (chemotherapy-induced nausea and vomiting)   . GERD (gastroesophageal reflux disease)   . Hyperlipidemia   . Hypothyroidism   . Mitral insufficiency   . Ovarian cancer [facility])    s/p BSO optimal tumor debulking May 2016  . PVC (premature ventricular contraction)     PAST SURGICAL HISTORY :   Past Surgical History:  Procedure Laterality Date  . ABDOMINAL HYSTERECTOMY    . BILATERAL SALPINGOOPHORECTOMY  May 2016   with optimal tumor debulking   . BREAST SURGERY     Left  . CESAREAN SECTION     x2  . CHOLECYSTECTOMY    . COLONOSCOPY  2006  . DEBULKING N/A 12/22/2014   Procedure: DEBULKING;  Surgeon: Gillis Ends, MD;  Location: [facility] ORS;  Service: Gynecology;  Laterality: N/A;  . LAPAROTOMY N/A 12/22/2014   Procedure: EXPLORATORY LAPAROTOMY;  Surgeon: Robert Bellow, MD;  Location: [facility] ORS;  Service: General;  Laterality: N/A;  . LAPAROTOMY WITH STAGING N/A 12/22/2014   Procedure: LAPAROTOMY WITH STAGING;  Surgeon: Gillis Ends, MD;  Location: [facility] ORS;  Service: Gynecology;  Laterality: N/A;  . PERIPHERAL VASCULAR CATHETERIZATION Left 12/22/2014   Procedure: PORTA CATH INSERTION;  Surgeon: Robert Bellow, MD;  Location: [facility] ORS;  Service: General;  Laterality: Left;  . PORTACATH PLACEMENT Right 12/22/2014   Procedure: INSERTION  PORT-A-CATH;  Surgeon: Robert Bellow, MD;  Location: [facility] ORS;  Service: General;  Laterality: Right;  . SALPINGOOPHORECTOMY Bilateral 12/22/2014   Procedure: SALPINGO OOPHORECTOMY;  Surgeon: Gillis Ends, MD;  Location: [facility] ORS;  Service: Gynecology;  Laterality: Bilateral;  . UPPER GI ENDOSCOPY  09/25/04   hiatus hernia, schatzki ring and a single gastric polyp    FAMILY HISTORY :   Family History  Problem  Relation Age of Onset  . Cancer Mother     breast, throat, and stomach  . Breast cancer Mother   . Heart disease Father   . Pulmonary embolism Sister   . Cancer Brother 60    angiosarcoma of the chest; carcinoid small intestinal tumor  . Hypertension Brother   . Cancer Maternal Aunt     breast cancer  . Cancer Maternal Grandfather 41    pancreatic    SOCIAL HISTORY:   Social History  Substance Use Topics  . Smoking status: Never Smoker  . Smokeless tobacco: Never Used  . Alcohol use No    ALLERGIES:  is allergic to carafate [sucralfate] and sulfa antibiotics.  MEDICATIONS:  Current Outpatient Prescriptions  Medication Sig Dispense Refill  . acetaminophen (TYLENOL) 500 MG chewable tablet Chew 500 mg by mouth every 6 (six) hours as needed for pain.    . chlorhexidine (PERIDEX) 0.12 % solution Use as directed 5 mLs in the mouth or throat as needed.     . Cholecalciferol (VITAMIN D) 2000 units tablet Take 1 tablet (2,000 Units total) by mouth daily. 30 tablet 12  . lactulose (CHRONULAC) 10 GM/15ML solution TAKE 15MLS (10G TOTAL) BY MOUTH THREE TIMES A DAY AS DIRECTED BY PHYSICIAN. 240 mL 1  . levothyroxine (SYNTHROID, LEVOTHROID) 50 MCG tablet Take 1 tablet (50 mcg total) by mouth daily. 30 tablet 12  . lidocaine-prilocaine (EMLA) cream Apply to port area 30-45 mins prior to chemo. 30 g 5  . LORazepam (ATIVAN) 0.5 MG tablet Take 0.5 tablets (0.25 mg total) by mouth at bedtime. 30 tablet 0  . Multiple Vitamin (MULTIVITAMIN) capsule Take 1 capsule by mouth daily.    . ondansetron (ZOFRAN) 4 MG tablet Take 1 tablet (4 mg total) by mouth every 8 (eight) hours as needed for nausea or vomiting. 30 tablet 3  . pantoprazole (PROTONIX) 40 MG tablet Take 1 tablet (40 mg total) by mouth daily. 30 tablet 12  . polyethylene glycol (MIRALAX / GLYCOLAX) packet Take 17 g by mouth daily. 30 each 4  . prochlorperazine (COMPAZINE) 10 MG tablet Take 1 tablet (10 mg total) by mouth every 6 (six) hours  as needed for nausea or vomiting. 30 tablet 0  . propafenone (RYTHMOL) 225 MG tablet Take 225 mg by mouth 2 (two) times daily.    . traMADol (ULTRAM) 50 MG tablet Take 1 tablet (50 mg total) by mouth every 6 (six) hours as needed. (Patient not taking: Reported on 10/22/2016) 30 tablet 0   No current facility-administered medications for this visit.    Facility-Administered Medications Ordered in Other Visits  Medication Dose Route Frequency Provider Last Rate Last Dose  . sodium chloride 0.9 % injection 10 mL  10 mL Intracatheter PRN Forest Gleason, MD   10 mL at 02/07/15 0930  . sodium chloride flush (NS) 0.9 % injection 10 mL  10 mL Intravenous PRN Cammie Sickle, MD   10 mL at 11/05/16 0910    PHYSICAL EXAMINATION: ECOG PERFORMANCE STATUS: 0 - Asymptomatic  BP 135/73  Pulse 85   Temp 97.6 F (36.4 C) (Tympanic)   Resp 20   Ht 5' 3.5" (1.613 m)   Wt 187 lb (84.8 kg)   BMI 32.61 kg/m   Filed Weights   11/05/16 0851  Weight: 187 lb (84.8 kg)    GENERAL: Well-nourished well-developed; Alert, no distress and comfortAccompanied by her husband. EYES: no pallor or icterus OROPHARYNX: no thrush or ulceration; good dentition  NECK: supple, no masses felt LYMPH:  no palpable lymphadenopathy in the cervical, axillary or inguinal regions LUNGS: clear to auscultation and  No wheeze or crackles HEART/CVS: regular rate & rhythm and no murmurs; No lower extremity edema ABDOMEN:abdomen soft, non-tender and normal bowel sounds Musculoskeletal:no cyanosis of digits and no clubbing  PSYCH: alert & oriented x 3 with fluent speech NEURO: no focal motor/sensory deficits SKIN:  Normal. No rash noted.   LABORATORY DATA:  I have reviewed the data as listed    Component Value Date/Time   NA 138 11/05/2016 0828   NA 140 12/14/2014 1037   K 4.1 11/05/2016 0828   K 4.4 12/14/2014 1037   CL 107 11/05/2016 0828   CL 106 12/14/2014 1037   CO2 24 11/05/2016 0828   CO2 26 12/14/2014 1037    GLUCOSE 105 (H) 11/05/2016 0828   GLUCOSE 87 12/14/2014 1037   BUN 15 11/05/2016 0828   BUN 12 12/14/2014 1037   CREATININE 0.94 11/05/2016 0828   CREATININE 0.97 12/14/2014 1037   CALCIUM 9.2 11/05/2016 0828   CALCIUM 9.2 12/14/2014 1037   PROT 6.4 (L) 11/05/2016 0828   PROT 7.3 12/14/2014 1037   ALBUMIN 3.5 11/05/2016 0828   ALBUMIN 3.8 12/14/2014 1037   AST 26 11/05/2016 0828   AST 20 12/14/2014 1037   ALT 16 11/05/2016 0828   ALT 11 (L) 12/14/2014 1037   ALKPHOS 64 11/05/2016 0828   ALKPHOS 138 (H) 12/14/2014 1037   BILITOT 0.5 11/05/2016 0828   BILITOT 0.5 12/14/2014 1037   GFRNONAA 59 (L) 11/05/2016 0828   GFRNONAA 59 (L) 12/14/2014 1037   GFRAA >60 11/05/2016 0828   GFRAA >60 12/14/2014 1037    No results found for: SPEP, UPEP  Lab Results  Component Value Date   WBC 4.5 11/05/2016   NEUTROABS 1.7 11/05/2016   HGB 12.6 11/05/2016   HCT 36.7 11/05/2016   MCV 98.0 11/05/2016   PLT 217 11/05/2016      Chemistry      Component Value Date/Time   NA 138 11/05/2016 0828   NA 140 12/14/2014 1037   K 4.1 11/05/2016 0828   K 4.4 12/14/2014 1037   CL 107 11/05/2016 0828   CL 106 12/14/2014 1037   CO2 24 11/05/2016 0828   CO2 26 12/14/2014 1037   BUN 15 11/05/2016 0828   BUN 12 12/14/2014 1037   CREATININE 0.94 11/05/2016 0828   CREATININE 0.97 12/14/2014 1037   GLU 84 03/16/2014      Component Value Date/Time   CALCIUM 9.2 11/05/2016 0828   CALCIUM 9.2 12/14/2014 1037   ALKPHOS 64 11/05/2016 0828   ALKPHOS 138 (H) 12/14/2014 1037   AST 26 11/05/2016 0828   AST 20 12/14/2014 1037   ALT 16 11/05/2016 0828   ALT 11 (L) 12/14/2014 1037   BILITOT 0.5 11/05/2016 0828   BILITOT 0.5 12/14/2014 1037    IMPRESSION: 1. Today's study demonstrates slight interval growth of several peritoneal implants in the low anatomic pelvis indicative of slight progression of disease. No other  new peritoneal deposits are noted, and there is no other metastatic disease noted  elsewhere in the chest, abdomen or pelvis. 2. Colonic diverticulosis without evidence of acute diverticulitis at this time. 3. Aortic atherosclerosis, in addition to left main coronary artery disease. Assessment for potential risk factor modification, dietary therapy or pharmacologic therapy may be warranted, if clinically indicated. 4. Additional incidental findings, as above.   Electronically Signed   By: Vinnie Langton M.D.   On: 08/15/2016 14:08   RADIOGRAPHIC STUDIES: I have personally reviewed the radiological images as listed and agreed with the findings in the report. No results found.   ASSESSMENT & PLAN:  Ovary cancer, unspecified laterality ([place]) Recurrent  Platinum refractory ovarian cancer [dec 2017] slight increase in the CA-125.DEC 2017-  CT scan shows slight increase in the peritoneal deposits in the pelvis.  # Currently On Taxol- Avastin [day1,8 & 15 every 28 days [off-day 22]; Avastin every 2 weeks. S/p cycle #2; day-1. Ca-125 improving.   # Tolerating chemo well- except for side effects [discussed below; neutropenia]. Dose of taxol reduced to 60 mg/m2.   # chemo neutropenia- resolved; after reducing the dose of taxol to '60mg'$ /m2.   #  URI- recommend benadryl/ flonase  # follow up in 2 weeks/ labs-UA; ca 125/ Taxol-Avastin in 3 weeks.; taxol- avastin in 1 week.    Orders Placed This Encounter  Procedures  . CBC with Differential    Standing Status:   Future    Standing Expiration Date:   11/05/2017  . Comprehensive metabolic panel    Standing Status:   Future    Standing Expiration Date:   11/05/2017  . CA 125    Standing Status:   Future    Standing Expiration Date:   11/05/2017  . Urinalysis, Complete w Microscopic    Standing Status:   Future    Standing Expiration Date:   11/05/2017  . CBC with Differential    Standing Status:   Future    Standing Expiration Date:   11/05/2017  . Urinalysis, Complete w Microscopic    Standing Status:   Future     Standing Expiration Date:   11/05/2017   All questions were answered. The patient knows to call the clinic with any problems, questions or concerns.      Cammie Sickle, MD 11/05/2016 1:28 PM

## 2016-11-06 LAB — CA 125: CA 125: 13 U/mL (ref 0.0–38.1)

## 2016-11-07 ENCOUNTER — Telehealth: Payer: Self-pay | Admitting: *Deleted

## 2016-11-07 NOTE — Telephone Encounter (Signed)
Patient contacted cancer center requested to speak to RN regarding her bill from [employer]. States that her insurance would not cover my choice hrd testing. She will be receiving a bill for $6000. Pt would like to further discuss her bills. Patient and her husband was provided with [employer] phone number at [phone]

## 2016-11-12 ENCOUNTER — Inpatient Hospital Stay: Payer: PPO

## 2016-11-12 VITALS — BP 133/77 | HR 82 | Temp 97.5°F | Resp 18

## 2016-11-12 DIAGNOSIS — C569 Malignant neoplasm of unspecified ovary: Secondary | ICD-10-CM

## 2016-11-12 DIAGNOSIS — Z5111 Encounter for antineoplastic chemotherapy: Secondary | ICD-10-CM | POA: Diagnosis not present

## 2016-11-12 LAB — CBC WITH DIFFERENTIAL/PLATELET
Basophils Absolute: 0 10*3/uL (ref 0–0.1)
Basophils Relative: 1 %
Eosinophils Absolute: 0.1 10*3/uL (ref 0–0.7)
Eosinophils Relative: 2 %
HEMATOCRIT: 35.4 % (ref 35.0–47.0)
HEMOGLOBIN: 12.1 g/dL (ref 12.0–16.0)
LYMPHS ABS: 2 10*3/uL (ref 1.0–3.6)
LYMPHS PCT: 50 %
MCH: 33.8 pg (ref 26.0–34.0)
MCHC: 34.3 g/dL (ref 32.0–36.0)
MCV: 98.7 fL (ref 80.0–100.0)
MONOS PCT: 10 %
Monocytes Absolute: 0.4 10*3/uL (ref 0.2–0.9)
NEUTROS ABS: 1.5 10*3/uL (ref 1.4–6.5)
Neutrophils Relative %: 37 %
Platelets: 244 10*3/uL (ref 150–440)
RBC: 3.58 MIL/uL — AB (ref 3.80–5.20)
RDW: 14.4 % (ref 11.5–14.5)
WBC: 4.1 10*3/uL (ref 3.6–11.0)

## 2016-11-12 LAB — URINALYSIS, COMPLETE (UACMP) WITH MICROSCOPIC
BACTERIA UA: NONE SEEN
Bilirubin Urine: NEGATIVE
GLUCOSE, UA: NEGATIVE mg/dL
HGB URINE DIPSTICK: NEGATIVE
KETONES UR: NEGATIVE mg/dL
Nitrite: NEGATIVE
PROTEIN: NEGATIVE mg/dL
Specific Gravity, Urine: 1.017 (ref 1.005–1.030)
pH: 5 (ref 5.0–8.0)

## 2016-11-12 MED ORDER — SODIUM CHLORIDE 0.9 % IV SOLN
Freq: Once | INTRAVENOUS | Status: AC
  Administered 2016-11-12: 10:00:00 via INTRAVENOUS
  Filled 2016-11-12: qty 1000

## 2016-11-12 MED ORDER — SODIUM CHLORIDE 0.9 % IV SOLN
20.0000 mg | Freq: Once | INTRAVENOUS | Status: AC
  Administered 2016-11-12: 20 mg via INTRAVENOUS
  Filled 2016-11-12: qty 2

## 2016-11-12 MED ORDER — SODIUM CHLORIDE 0.9 % IV SOLN
800.0000 mg | Freq: Once | INTRAVENOUS | Status: AC
  Administered 2016-11-12: 800 mg via INTRAVENOUS
  Filled 2016-11-12: qty 32

## 2016-11-12 MED ORDER — FAMOTIDINE IN NACL 20-0.9 MG/50ML-% IV SOLN
20.0000 mg | Freq: Once | INTRAVENOUS | Status: AC
  Administered 2016-11-12: 20 mg via INTRAVENOUS
  Filled 2016-11-12: qty 50

## 2016-11-12 MED ORDER — DIPHENHYDRAMINE HCL 50 MG/ML IJ SOLN
25.0000 mg | Freq: Once | INTRAMUSCULAR | Status: AC
  Administered 2016-11-12: 25 mg via INTRAVENOUS
  Filled 2016-11-12: qty 1

## 2016-11-12 MED ORDER — SODIUM CHLORIDE 0.9 % IV SOLN
60.0000 mg/m2 | Freq: Once | INTRAVENOUS | Status: AC
  Administered 2016-11-12: 114 mg via INTRAVENOUS
  Filled 2016-11-12: qty 19

## 2016-11-12 MED ORDER — HEPARIN SOD (PORK) LOCK FLUSH 100 UNIT/ML IV SOLN
500.0000 [IU] | Freq: Once | INTRAVENOUS | Status: AC | PRN
  Administered 2016-11-12: 500 [IU]
  Filled 2016-11-12: qty 5

## 2016-11-13 ENCOUNTER — Other Ambulatory Visit: Payer: Self-pay | Admitting: Internal Medicine

## 2016-11-26 ENCOUNTER — Inpatient Hospital Stay: Payer: PPO | Attending: Internal Medicine

## 2016-11-26 ENCOUNTER — Inpatient Hospital Stay (HOSPITAL_BASED_OUTPATIENT_CLINIC_OR_DEPARTMENT_OTHER): Payer: PPO | Admitting: Internal Medicine

## 2016-11-26 ENCOUNTER — Inpatient Hospital Stay: Payer: PPO

## 2016-11-26 VITALS — BP 115/74 | HR 77 | Temp 96.5°F | Resp 16 | Wt 187.4 lb

## 2016-11-26 DIAGNOSIS — Z853 Personal history of malignant neoplasm of breast: Secondary | ICD-10-CM | POA: Insufficient documentation

## 2016-11-26 DIAGNOSIS — C786 Secondary malignant neoplasm of retroperitoneum and peritoneum: Secondary | ICD-10-CM | POA: Diagnosis not present

## 2016-11-26 DIAGNOSIS — Z803 Family history of malignant neoplasm of breast: Secondary | ICD-10-CM | POA: Insufficient documentation

## 2016-11-26 DIAGNOSIS — I252 Old myocardial infarction: Secondary | ICD-10-CM | POA: Diagnosis not present

## 2016-11-26 DIAGNOSIS — E785 Hyperlipidemia, unspecified: Secondary | ICD-10-CM | POA: Diagnosis not present

## 2016-11-26 DIAGNOSIS — E039 Hypothyroidism, unspecified: Secondary | ICD-10-CM | POA: Insufficient documentation

## 2016-11-26 DIAGNOSIS — I700 Atherosclerosis of aorta: Secondary | ICD-10-CM | POA: Diagnosis not present

## 2016-11-26 DIAGNOSIS — Z800 Family history of malignant neoplasm of digestive organs: Secondary | ICD-10-CM | POA: Diagnosis not present

## 2016-11-26 DIAGNOSIS — Z5112 Encounter for antineoplastic immunotherapy: Secondary | ICD-10-CM | POA: Insufficient documentation

## 2016-11-26 DIAGNOSIS — M5137 Other intervertebral disc degeneration, lumbosacral region: Secondary | ICD-10-CM | POA: Insufficient documentation

## 2016-11-26 DIAGNOSIS — R51 Headache: Secondary | ICD-10-CM | POA: Diagnosis not present

## 2016-11-26 DIAGNOSIS — Z5111 Encounter for antineoplastic chemotherapy: Secondary | ICD-10-CM | POA: Insufficient documentation

## 2016-11-26 DIAGNOSIS — M1990 Unspecified osteoarthritis, unspecified site: Secondary | ICD-10-CM | POA: Insufficient documentation

## 2016-11-26 DIAGNOSIS — I340 Nonrheumatic mitral (valve) insufficiency: Secondary | ICD-10-CM | POA: Diagnosis not present

## 2016-11-26 DIAGNOSIS — K219 Gastro-esophageal reflux disease without esophagitis: Secondary | ICD-10-CM | POA: Diagnosis not present

## 2016-11-26 DIAGNOSIS — Z90710 Acquired absence of both cervix and uterus: Secondary | ICD-10-CM | POA: Insufficient documentation

## 2016-11-26 DIAGNOSIS — Z79899 Other long term (current) drug therapy: Secondary | ICD-10-CM | POA: Diagnosis not present

## 2016-11-26 DIAGNOSIS — C569 Malignant neoplasm of unspecified ovary: Secondary | ICD-10-CM

## 2016-11-26 DIAGNOSIS — Z90722 Acquired absence of ovaries, bilateral: Secondary | ICD-10-CM | POA: Diagnosis not present

## 2016-11-26 DIAGNOSIS — I4891 Unspecified atrial fibrillation: Secondary | ICD-10-CM | POA: Insufficient documentation

## 2016-11-26 DIAGNOSIS — K5730 Diverticulosis of large intestine without perforation or abscess without bleeding: Secondary | ICD-10-CM | POA: Insufficient documentation

## 2016-11-26 DIAGNOSIS — K1230 Oral mucositis (ulcerative), unspecified: Secondary | ICD-10-CM

## 2016-11-26 DIAGNOSIS — I493 Ventricular premature depolarization: Secondary | ICD-10-CM

## 2016-11-26 DIAGNOSIS — Z7189 Other specified counseling: Secondary | ICD-10-CM | POA: Insufficient documentation

## 2016-11-26 DIAGNOSIS — Z923 Personal history of irradiation: Secondary | ICD-10-CM | POA: Insufficient documentation

## 2016-11-26 LAB — URINALYSIS, COMPLETE (UACMP) WITH MICROSCOPIC
BILIRUBIN URINE: NEGATIVE
Bacteria, UA: NONE SEEN
GLUCOSE, UA: NEGATIVE mg/dL
Hgb urine dipstick: NEGATIVE
KETONES UR: NEGATIVE mg/dL
Leukocytes, UA: NEGATIVE
NITRITE: NEGATIVE
PH: 5 (ref 5.0–8.0)
Protein, ur: NEGATIVE mg/dL
SPECIFIC GRAVITY, URINE: 1.016 (ref 1.005–1.030)
Squamous Epithelial / LPF: NONE SEEN

## 2016-11-26 LAB — CBC WITH DIFFERENTIAL/PLATELET
Basophils Absolute: 0 10*3/uL (ref 0–0.1)
Basophils Relative: 1 %
EOS PCT: 2 %
Eosinophils Absolute: 0.1 10*3/uL (ref 0–0.7)
HCT: 35.7 % (ref 35.0–47.0)
Hemoglobin: 12.5 g/dL (ref 12.0–16.0)
LYMPHS ABS: 1.7 10*3/uL (ref 1.0–3.6)
LYMPHS PCT: 43 %
MCH: 34 pg (ref 26.0–34.0)
MCHC: 34.9 g/dL (ref 32.0–36.0)
MCV: 97.4 fL (ref 80.0–100.0)
MONO ABS: 0.5 10*3/uL (ref 0.2–0.9)
MONOS PCT: 13 %
Neutro Abs: 1.6 10*3/uL (ref 1.4–6.5)
Neutrophils Relative %: 41 %
PLATELETS: 235 10*3/uL (ref 150–440)
RBC: 3.67 MIL/uL — AB (ref 3.80–5.20)
RDW: 15.5 % — ABNORMAL HIGH (ref 11.5–14.5)
WBC: 3.8 10*3/uL (ref 3.6–11.0)

## 2016-11-26 LAB — COMPREHENSIVE METABOLIC PANEL
ALK PHOS: 59 U/L (ref 38–126)
ALT: 17 U/L (ref 14–54)
ANION GAP: 6 (ref 5–15)
AST: 22 U/L (ref 15–41)
Albumin: 3.5 g/dL (ref 3.5–5.0)
BUN: 14 mg/dL (ref 6–20)
CALCIUM: 9.1 mg/dL (ref 8.9–10.3)
CHLORIDE: 107 mmol/L (ref 101–111)
CO2: 25 mmol/L (ref 22–32)
CREATININE: 0.88 mg/dL (ref 0.44–1.00)
Glucose, Bld: 103 mg/dL — ABNORMAL HIGH (ref 65–99)
Potassium: 3.9 mmol/L (ref 3.5–5.1)
Sodium: 138 mmol/L (ref 135–145)
Total Bilirubin: 0.6 mg/dL (ref 0.3–1.2)
Total Protein: 6.4 g/dL — ABNORMAL LOW (ref 6.5–8.1)

## 2016-11-26 MED ORDER — DIPHENHYDRAMINE HCL 50 MG/ML IJ SOLN
25.0000 mg | Freq: Once | INTRAMUSCULAR | Status: AC
  Administered 2016-11-26: 25 mg via INTRAVENOUS
  Filled 2016-11-26: qty 1

## 2016-11-26 MED ORDER — FAMOTIDINE IN NACL 20-0.9 MG/50ML-% IV SOLN
20.0000 mg | Freq: Once | INTRAVENOUS | Status: AC
  Administered 2016-11-26: 20 mg via INTRAVENOUS
  Filled 2016-11-26: qty 50

## 2016-11-26 MED ORDER — HEPARIN SOD (PORK) LOCK FLUSH 100 UNIT/ML IV SOLN
500.0000 [IU] | Freq: Once | INTRAVENOUS | Status: AC | PRN
  Administered 2016-11-26: 500 [IU]
  Filled 2016-11-26: qty 5

## 2016-11-26 MED ORDER — SODIUM CHLORIDE 0.9 % IV SOLN
Freq: Once | INTRAVENOUS | Status: AC
  Administered 2016-11-26: 09:00:00 via INTRAVENOUS
  Filled 2016-11-26: qty 1000

## 2016-11-26 MED ORDER — SODIUM CHLORIDE 0.9 % IV SOLN
60.0000 mg/m2 | Freq: Once | INTRAVENOUS | Status: AC
  Administered 2016-11-26: 114 mg via INTRAVENOUS
  Filled 2016-11-26: qty 19

## 2016-11-26 MED ORDER — SODIUM CHLORIDE 0.9% FLUSH
10.0000 mL | INTRAVENOUS | Status: DC | PRN
  Administered 2016-11-26: 10 mL
  Filled 2016-11-26: qty 10

## 2016-11-26 MED ORDER — SODIUM CHLORIDE 0.9 % IV SOLN
20.0000 mg | Freq: Once | INTRAVENOUS | Status: AC
  Administered 2016-11-26: 20 mg via INTRAVENOUS
  Filled 2016-11-26: qty 2

## 2016-11-26 MED ORDER — SODIUM CHLORIDE 0.9 % IV SOLN
800.0000 mg | Freq: Once | INTRAVENOUS | Status: AC
  Administered 2016-11-26: 800 mg via INTRAVENOUS
  Filled 2016-11-26: qty 32

## 2016-11-26 NOTE — Progress Notes (Signed)
Patient here today for follow up.  Patient states that she has been having dry mouth and soreness down her throat.

## 2016-11-26 NOTE — Progress Notes (Signed)
[place] OFFICE PROGRESS NOTE  Patient Care Team: Jerrol Banana., MD as PCP - General (Family Medicine) [facility] Gaetana Michaelis, MD as Referring Physician (Obstetrics and Gynecology) Robert Bellow, MD as Consulting Physician (General Surgery)  Ovarian cancer [facility])   Staging form: Ovary, AJCC 7th Edition     Clinical: Stage IIIC (T3c, N0, M0) - Unsigned    Oncology History   # April- MAY 2016- OVARIAN CANCER STAGE IIIC; CA-125 +2300+;  s/p OPTIMAL DEBULKING SURGERY   # MAY 2016[name] [finished in Sep 19th 2017]  # MAY CT 2017- RECURRENT/Peritoneal carcinomatosis/pelvic implant ~1.2cm/Ca-125-34;   # July 3rd- CARBO-DOXIL q 4W [with neulasta]; CT May 11 2016- slight increase peritoneal / stable nodules. Cont chemo [finished DEC 2017]. DEC 28th 2017 CT- Increase in peritoneal deposits; increasing Ca 125 [60s]  # G-1-2 hand foot syndrome-   # LEFT BREAST CA s/p Lumpec & RT s/p TAM   # Afib [cardiology]; JUNE 2017- MUGA 62%  MOLECULAR TESTING- ATM genetic mutation; TUMOR BRCA- NEG; Myriad genomic instability- NEGATIVE     Ovary cancer, unspecified laterality ([place])   12/16/2014 Initial Diagnosis    Ovarian cancer       INTERVAL HISTORY:  76 year old female patient with above history of ovarian cancer recurrent Platinum Refractory ovarian cancer [based on CT scan in December 2017]- is here for follow-up; Patient is currently on Avastin and Taxol. Currently status post cycle #2 day-15 Approximately a week ago.  Mild headaches. Not consistent. Denies any significant nausea or vomiting. Denies any significant tingling or numbness.  Denies any fatigue. Denies any abdominal pain. Denies any abdominal distention. No chest pain or shortness of breath or cough.No headaches. Noted to have mild sores in the mouth. Used baking soda and salt rinses.  REVIEW OF SYSTEMS:  A complete 10 point review of system is done which is negative except mentioned  above/history of present illness.   PAST MEDICAL HISTORY :  Past Medical History:  Diagnosis Date  . Atrial fibrillation ([place])   . Cancer of breast ([place]) 1992   Left  . CINV (chemotherapy-induced nausea and vomiting)   . GERD (gastroesophageal reflux disease)   . Hyperlipidemia   . Hypothyroidism   . Mitral insufficiency   . Ovarian cancer [facility])    s/p BSO optimal tumor debulking May 2016  . PVC (premature ventricular contraction)     PAST SURGICAL HISTORY :   Past Surgical History:  Procedure Laterality Date  . ABDOMINAL HYSTERECTOMY    . BILATERAL SALPINGOOPHORECTOMY  May 2016   with optimal tumor debulking   . BREAST SURGERY     Left  . CESAREAN SECTION     x2  . CHOLECYSTECTOMY    . COLONOSCOPY  2006  . DEBULKING N/A 12/22/2014   Procedure: DEBULKING;  Surgeon: Gillis Ends, MD;  Location: [facility] ORS;  Service: Gynecology;  Laterality: N/A;  . LAPAROTOMY N/A 12/22/2014   Procedure: EXPLORATORY LAPAROTOMY;  Surgeon: Robert Bellow, MD;  Location: [facility] ORS;  Service: General;  Laterality: N/A;  . LAPAROTOMY WITH STAGING N/A 12/22/2014   Procedure: LAPAROTOMY WITH STAGING;  Surgeon: Gillis Ends, MD;  Location: [facility] ORS;  Service: Gynecology;  Laterality: N/A;  . PERIPHERAL VASCULAR CATHETERIZATION Left 12/22/2014   Procedure: PORTA CATH INSERTION;  Surgeon: Robert Bellow, MD;  Location: [facility] ORS;  Service: General;  Laterality: Left;  . PORTACATH PLACEMENT Right 12/22/2014   Procedure: INSERTION PORT-A-CATH;  Surgeon: Robert Bellow,  MD;  Location: [facility] ORS;  Service: General;  Laterality: Right;  . SALPINGOOPHORECTOMY Bilateral 12/22/2014   Procedure: SALPINGO OOPHORECTOMY;  Surgeon: Gillis Ends, MD;  Location: [facility] ORS;  Service: Gynecology;  Laterality: Bilateral;  . UPPER GI ENDOSCOPY  09/25/04   hiatus hernia, schatzki ring and a single gastric polyp    FAMILY HISTORY :   Family History  Problem Relation Age of Onset  . Cancer Mother      breast, throat, and stomach  . Breast cancer Mother   . Heart disease Father   . Pulmonary embolism Sister   . Cancer Brother 60    angiosarcoma of the chest; carcinoid small intestinal tumor  . Hypertension Brother   . Cancer Maternal Aunt     breast cancer  . Cancer Maternal Grandfather 69    pancreatic    SOCIAL HISTORY:   Social History  Substance Use Topics  . Smoking status: Never Smoker  . Smokeless tobacco: Never Used  . Alcohol use No    ALLERGIES:  is allergic to carafate [sucralfate] and sulfa antibiotics.  MEDICATIONS:  Current Outpatient Prescriptions  Medication Sig Dispense Refill  . acetaminophen (TYLENOL) 500 MG chewable tablet Chew 500 mg by mouth every 6 (six) hours as needed for pain.    . Cholecalciferol (VITAMIN D) 2000 units tablet Take 1 tablet (2,000 Units total) by mouth daily. 30 tablet 12  . lactulose (CHRONULAC) 10 GM/15ML solution TAKE 15MLS (10G TOTAL) BY MOUTH THREE TIMES A DAY AS DIRECTED BY PHYSICIAN. 240 mL 1  . levothyroxine (SYNTHROID, LEVOTHROID) 50 MCG tablet Take 1 tablet (50 mcg total) by mouth daily. 30 tablet 12  . lidocaine-prilocaine (EMLA) cream Apply to port area 30-45 mins prior to chemo. 30 g 5  . LORazepam (ATIVAN) 0.5 MG tablet Take 0.5 tablets (0.25 mg total) by mouth at bedtime. 30 tablet 0  . Multiple Vitamin (MULTIVITAMIN) capsule Take 1 capsule by mouth daily.    . ondansetron (ZOFRAN) 4 MG tablet Take 1 tablet (4 mg total) by mouth every 8 (eight) hours as needed for nausea or vomiting. 30 tablet 3  . pantoprazole (PROTONIX) 40 MG tablet Take 1 tablet (40 mg total) by mouth daily. 30 tablet 12  . polyethylene glycol powder (GLYCOLAX/MIRALAX) powder TAKE 17GM (LINE INSIDE CAP) IN EIGHT OUNCES OF WATER OR OTHER LIQUID DAILY 255 g 1  . propafenone (RYTHMOL) 225 MG tablet Take 225 mg by mouth 2 (two) times daily.    . chlorhexidine (PERIDEX) 0.12 % solution Use as directed 5 mLs in the mouth or throat as needed.     .  prochlorperazine (COMPAZINE) 10 MG tablet Take 1 tablet (10 mg total) by mouth every 6 (six) hours as needed for nausea or vomiting. (Patient not taking: Reported on 11/26/2016) 30 tablet 0  . traMADol (ULTRAM) 50 MG tablet Take 1 tablet (50 mg total) by mouth every 6 (six) hours as needed. (Patient not taking: Reported on 11/26/2016) 30 tablet 0   No current facility-administered medications for this visit.    Facility-Administered Medications Ordered in Other Visits  Medication Dose Route Frequency Provider Last Rate Last Dose  . sodium chloride 0.9 % injection 10 mL  10 mL Intracatheter PRN Forest Gleason, MD   10 mL at 02/07/15 0930  . sodium chloride flush (NS) 0.9 % injection 10 mL  10 mL Intracatheter PRN Cammie Sickle, MD   10 mL at 11/26/16 1231    PHYSICAL EXAMINATION: ECOG PERFORMANCE STATUS: 0 -  Asymptomatic  BP 115/74 (BP Location: Left Arm, Patient Position: Sitting)   Pulse 77   Temp (!) 96.5 F (35.8 C) (Tympanic)   Resp 16   Wt 187 lb 6 oz (85 kg)   BMI 32.67 kg/m   Filed Weights   11/26/16 0850  Weight: 187 lb 6 oz (85 kg)    GENERAL: Well-nourished well-developed; Alert, no distress and comfortAccompanied by her husband. EYES: no pallor or icterus OROPHARYNX: no thrush or ulceration; good dentition  NECK: supple, no masses felt LYMPH:  no palpable lymphadenopathy in the cervical, axillary or inguinal regions LUNGS: clear to auscultation and  No wheeze or crackles HEART/CVS: regular rate & rhythm and no murmurs; No lower extremity edema ABDOMEN:abdomen soft, non-tender and normal bowel sounds Musculoskeletal:no cyanosis of digits and no clubbing  PSYCH: alert & oriented x 3 with fluent speech NEURO: no focal motor/sensory deficits SKIN:  Normal. No rash noted.   LABORATORY DATA:  I have reviewed the data as listed    Component Value Date/Time   NA 138 11/26/2016 0828   NA 140 12/14/2014 1037   K 3.9 11/26/2016 0828   K 4.4 12/14/2014 1037   CL 107  11/26/2016 0828   CL 106 12/14/2014 1037   CO2 25 11/26/2016 0828   CO2 26 12/14/2014 1037   GLUCOSE 103 (H) 11/26/2016 0828   GLUCOSE 87 12/14/2014 1037   BUN 14 11/26/2016 0828   BUN 12 12/14/2014 1037   CREATININE 0.88 11/26/2016 0828   CREATININE 0.97 12/14/2014 1037   CALCIUM 9.1 11/26/2016 0828   CALCIUM 9.2 12/14/2014 1037   PROT 6.4 (L) 11/26/2016 0828   PROT 7.3 12/14/2014 1037   ALBUMIN 3.5 11/26/2016 0828   ALBUMIN 3.8 12/14/2014 1037   AST 22 11/26/2016 0828   AST 20 12/14/2014 1037   ALT 17 11/26/2016 0828   ALT 11 (L) 12/14/2014 1037   ALKPHOS 59 11/26/2016 0828   ALKPHOS 138 (H) 12/14/2014 1037   BILITOT 0.6 11/26/2016 0828   BILITOT 0.5 12/14/2014 1037   GFRNONAA >60 11/26/2016 0828   GFRNONAA 59 (L) 12/14/2014 1037   GFRAA >60 11/26/2016 0828   GFRAA >60 12/14/2014 1037    No results found for: SPEP, UPEP  Lab Results  Component Value Date   WBC 3.8 11/26/2016   NEUTROABS 1.6 11/26/2016   HGB 12.5 11/26/2016   HCT 35.7 11/26/2016   MCV 97.4 11/26/2016   PLT 235 11/26/2016      Chemistry      Component Value Date/Time   NA 138 11/26/2016 0828   NA 140 12/14/2014 1037   K 3.9 11/26/2016 0828   K 4.4 12/14/2014 1037   CL 107 11/26/2016 0828   CL 106 12/14/2014 1037   CO2 25 11/26/2016 0828   CO2 26 12/14/2014 1037   BUN 14 11/26/2016 0828   BUN 12 12/14/2014 1037   CREATININE 0.88 11/26/2016 0828   CREATININE 0.97 12/14/2014 1037   GLU 84 03/16/2014      Component Value Date/Time   CALCIUM 9.1 11/26/2016 0828   CALCIUM 9.2 12/14/2014 1037   ALKPHOS 59 11/26/2016 0828   ALKPHOS 138 (H) 12/14/2014 1037   AST 22 11/26/2016 0828   AST 20 12/14/2014 1037   ALT 17 11/26/2016 0828   ALT 11 (L) 12/14/2014 1037   BILITOT 0.6 11/26/2016 0828   BILITOT 0.5 12/14/2014 1037    IMPRESSION: 1. Today's study demonstrates slight interval growth of several peritoneal implants in the low  anatomic pelvis indicative of slight progression of disease.  No other new peritoneal deposits are noted, and there is no other metastatic disease noted elsewhere in the chest, abdomen or pelvis. 2. Colonic diverticulosis without evidence of acute diverticulitis at this time. 3. Aortic atherosclerosis, in addition to left main coronary artery disease. Assessment for potential risk factor modification, dietary therapy or pharmacologic therapy may be warranted, if clinically indicated. 4. Additional incidental findings, as above.   Electronically Signed   By: Vinnie Langton M.D.   On: 08/15/2016 14:08   RADIOGRAPHIC STUDIES: I have personally reviewed the radiological images as listed and agreed with the findings in the report. No results found.   ASSESSMENT & PLAN:  Ovary cancer, unspecified laterality ([place]) Recurrent  Platinum refractory ovarian cancer [dec 2017] slight increase in the CA-125.DEC 2017-  CT scan shows slight increase in the peritoneal deposits in the pelvis. Ca-125 improving.   # Currently On Taxol- Avastin [day1,8 & 15 every 28 days [off-day 22]; Avastin every 2 weeks. Proceed with cycle #3; day-1 today.   # Mucositis- G-1-2; salt/baking soda rinses  # Reviewed/counselled regarding the goals of care- being palliative/treatment are usually indefinite-until progression or side effects. Goal is to maintain quality of life as the disease is incurable.  # follow up in 2 weeks/ labs-UA; ca 125/ Taxol-Avastin; .; Taxol  in 1 week.    Orders Placed This Encounter  Procedures  . CBC with Differential    Standing Status:   Future    Standing Expiration Date:   11/26/2017  . Comprehensive metabolic panel    Standing Status:   Future    Standing Expiration Date:   11/26/2017  . Urinalysis, Complete w Microscopic    Standing Status:   Future    Standing Expiration Date:   11/26/2017   All questions were answered. The patient knows to call the clinic with any problems, questions or concerns.      Cammie Sickle,  MD 11/26/2016 1:34 PM

## 2016-11-26 NOTE — Assessment & Plan Note (Addendum)
Recurrent  Platinum refractory ovarian cancer [dec 2017] slight increase in the CA-125.DEC 2017-  CT scan shows slight increase in the peritoneal deposits in the pelvis. Ca-125 improving.   # Currently On Taxol- Avastin [day1,8 & 15 every 28 days [off-day 22]; Avastin every 2 weeks. Proceed with cycle #3; day-1 today.   # Mucositis- G-1-2; salt/baking soda rinses  # Reviewed/counselled regarding the goals of care- being palliative/treatment are usually indefinite-until progression or side effects. Goal is to maintain quality of life as the disease is incurable.  # follow up in 2 weeks/ labs-UA; ca 125/ Taxol-Avastin; .; Taxol  in 1 week.

## 2016-11-27 LAB — CA 125: CA 125: 12.6 U/mL (ref 0.0–38.1)

## 2016-12-03 ENCOUNTER — Inpatient Hospital Stay: Payer: PPO

## 2016-12-03 VITALS — BP 123/76 | HR 83 | Temp 96.6°F | Resp 18

## 2016-12-03 DIAGNOSIS — Z5111 Encounter for antineoplastic chemotherapy: Secondary | ICD-10-CM | POA: Diagnosis not present

## 2016-12-03 DIAGNOSIS — C569 Malignant neoplasm of unspecified ovary: Secondary | ICD-10-CM

## 2016-12-03 LAB — CBC WITH DIFFERENTIAL/PLATELET
Basophils Absolute: 0 10*3/uL (ref 0–0.1)
Basophils Relative: 1 %
EOS PCT: 2 %
Eosinophils Absolute: 0.1 10*3/uL (ref 0–0.7)
HEMATOCRIT: 36.1 % (ref 35.0–47.0)
Hemoglobin: 12.6 g/dL (ref 12.0–16.0)
Lymphocytes Relative: 56 %
Lymphs Abs: 2.5 10*3/uL (ref 1.0–3.6)
MCH: 33.9 pg (ref 26.0–34.0)
MCHC: 34.9 g/dL (ref 32.0–36.0)
MCV: 97.1 fL (ref 80.0–100.0)
MONO ABS: 0.4 10*3/uL (ref 0.2–0.9)
MONOS PCT: 10 %
NEUTROS ABS: 1.4 10*3/uL (ref 1.4–6.5)
Neutrophils Relative %: 31 %
PLATELETS: 242 10*3/uL (ref 150–440)
RBC: 3.72 MIL/uL — ABNORMAL LOW (ref 3.80–5.20)
RDW: 15.2 % — AB (ref 11.5–14.5)
WBC: 4.4 10*3/uL (ref 3.6–11.0)

## 2016-12-03 MED ORDER — SODIUM CHLORIDE 0.9% FLUSH
10.0000 mL | Freq: Once | INTRAVENOUS | Status: AC
  Administered 2016-12-03: 10 mL via INTRAVENOUS
  Filled 2016-12-03: qty 10

## 2016-12-03 MED ORDER — SODIUM CHLORIDE 0.9 % IV SOLN
60.0000 mg/m2 | Freq: Once | INTRAVENOUS | Status: AC
  Administered 2016-12-03: 114 mg via INTRAVENOUS
  Filled 2016-12-03: qty 19

## 2016-12-03 MED ORDER — SODIUM CHLORIDE 0.9 % IV SOLN
20.0000 mg | Freq: Once | INTRAVENOUS | Status: AC
  Administered 2016-12-03: 20 mg via INTRAVENOUS
  Filled 2016-12-03: qty 2

## 2016-12-03 MED ORDER — HEPARIN SOD (PORK) LOCK FLUSH 100 UNIT/ML IV SOLN
500.0000 [IU] | Freq: Once | INTRAVENOUS | Status: AC
  Administered 2016-12-03: 500 [IU] via INTRAVENOUS
  Filled 2016-12-03: qty 5

## 2016-12-03 MED ORDER — SODIUM CHLORIDE 0.9 % IV SOLN
Freq: Once | INTRAVENOUS | Status: AC
  Administered 2016-12-03: 11:00:00 via INTRAVENOUS
  Filled 2016-12-03: qty 1000

## 2016-12-03 MED ORDER — DIPHENHYDRAMINE HCL 50 MG/ML IJ SOLN
25.0000 mg | Freq: Once | INTRAMUSCULAR | Status: AC
  Administered 2016-12-03: 25 mg via INTRAVENOUS
  Filled 2016-12-03: qty 1

## 2016-12-03 MED ORDER — FAMOTIDINE IN NACL 20-0.9 MG/50ML-% IV SOLN
20.0000 mg | Freq: Once | INTRAVENOUS | Status: AC
  Administered 2016-12-03: 20 mg via INTRAVENOUS
  Filled 2016-12-03: qty 50

## 2016-12-05 ENCOUNTER — Inpatient Hospital Stay (HOSPITAL_BASED_OUTPATIENT_CLINIC_OR_DEPARTMENT_OTHER): Payer: PPO | Admitting: Obstetrics and Gynecology

## 2016-12-05 ENCOUNTER — Encounter: Payer: Self-pay | Admitting: Obstetrics and Gynecology

## 2016-12-05 VITALS — BP 112/76 | HR 87 | Temp 97.6°F | Ht 63.5 in | Wt 189.4 lb

## 2016-12-05 DIAGNOSIS — Z90710 Acquired absence of both cervix and uterus: Secondary | ICD-10-CM | POA: Diagnosis not present

## 2016-12-05 DIAGNOSIS — I340 Nonrheumatic mitral (valve) insufficiency: Secondary | ICD-10-CM | POA: Diagnosis not present

## 2016-12-05 DIAGNOSIS — Z90722 Acquired absence of ovaries, bilateral: Secondary | ICD-10-CM

## 2016-12-05 DIAGNOSIS — C786 Secondary malignant neoplasm of retroperitoneum and peritoneum: Secondary | ICD-10-CM

## 2016-12-05 DIAGNOSIS — I4891 Unspecified atrial fibrillation: Secondary | ICD-10-CM

## 2016-12-05 DIAGNOSIS — K219 Gastro-esophageal reflux disease without esophagitis: Secondary | ICD-10-CM

## 2016-12-05 DIAGNOSIS — E785 Hyperlipidemia, unspecified: Secondary | ICD-10-CM

## 2016-12-05 DIAGNOSIS — Z9221 Personal history of antineoplastic chemotherapy: Secondary | ICD-10-CM

## 2016-12-05 DIAGNOSIS — M5137 Other intervertebral disc degeneration, lumbosacral region: Secondary | ICD-10-CM | POA: Diagnosis not present

## 2016-12-05 DIAGNOSIS — Z5111 Encounter for antineoplastic chemotherapy: Secondary | ICD-10-CM | POA: Diagnosis not present

## 2016-12-05 DIAGNOSIS — I493 Ventricular premature depolarization: Secondary | ICD-10-CM

## 2016-12-05 DIAGNOSIS — C569 Malignant neoplasm of unspecified ovary: Secondary | ICD-10-CM

## 2016-12-05 DIAGNOSIS — Z79899 Other long term (current) drug therapy: Secondary | ICD-10-CM

## 2016-12-05 DIAGNOSIS — Z853 Personal history of malignant neoplasm of breast: Secondary | ICD-10-CM

## 2016-12-05 DIAGNOSIS — I252 Old myocardial infarction: Secondary | ICD-10-CM

## 2016-12-05 DIAGNOSIS — M1990 Unspecified osteoarthritis, unspecified site: Secondary | ICD-10-CM

## 2016-12-05 DIAGNOSIS — E039 Hypothyroidism, unspecified: Secondary | ICD-10-CM

## 2016-12-05 NOTE — Patient Instructions (Signed)
  Lightbox video button  Mingo Amber, MD  Clinical Cancer Geneticist No ratings available.    [place]  My clinical areas of expertise include cancer risk counseling, screening for and prevention of inherited cancers, and providing gynecologic care to cancer patients. Since 2005, I have served on the Cancer Prevention and [place] and [place], and I currently co-chair the Prevention/Behavioral/Epidemiology working group. Previously I have served on the Education Committee of the Society of Gynecologic Oncologists, the Editorial Board of the Journal of Clinical Oncology, and the Committee on Genetics of the [employer] of Obstetricians and Gynecologists. I am also the [place] representative to the [place] Panel - Genetic/Familial High-Risk Assessment: Breast and Ovarian.  EDUCATION  MD, [employer], [place]  Obstetrics and Gynecology, [facility] [place]), 1993-1995  Obstetrics and Gynecology, [place], 1995-1998  FELLOWSHIP  Clinical Genetics, [facility], [id number]  BOARD CERTIFICATION  American Board of Obstetrics/Gynecology, Obstetrics & Gynecology

## 2016-12-05 NOTE — Progress Notes (Signed)
Gynecologic Oncology Interval Visit   Referring Provider: Blain Pais, MD.  Chief Concern: Platinum-resistant recurrent stage IIIc ovarian cancer  Subjective:  Jamie Conner is a 77 y.o. female who is seen for platinum-resistant recurrent stage IIIc ovarian cancer.   She has recently started paclitaxel and bevacizumab under the direction of Dr. Rogue Bussing on 09/25/2015. She is tolerating therapy well except for voice changes and epitaxis. She also has a headache after bevacizumab that resolved with tylenol. She presents today for a pelvic exam. Of note her daughter has tested positive for an ATM mutation carrier and she requested recommendations for who to [facility] at [facility].    Lab Results  Component Value Date   CA125 12.6 11/26/2016   CA125 13.0 11/05/2016   CA125 18.2 10/22/2016       Gynecologic Oncology History Jamie Conner is a pleasant female who is seen for postoperative visit for stage IIIc high-grade serous ovarian cancer. See prior notes for complete detail.  She also has a history of  carcinoma of breast ( left) status post lumpectomy , radiation therapy and 5 years of tamoxifen. She underwent exploratory Laparotomy, bilateral salpingo-oophorectomy, right ureterolysis, infragastric omentectomy, and optimally debulked to no gross residual  May 4th, 2016. Preop CA125 2354.0.  She started chemotherapy with carboplatinum and Taxol in dose dense fashion from Jan 02, 2015. She underwent 6 cycles of chemotherapy completed 05/09/2015. Her dose of Taxol was reduced because of myelosuppression and fever in spite of Neulasta.  [place] CA125 = 9.8  06/06/2015 CT scan abdomen and pelvis IMPRESSION: 1. Interval removal of the large right ovarian mass . Dramatic improvement in the appearance of mesenteric and omental implants. There is a 3 mm potential faint omental nodule at about the level of the umbilicus but for the most part the numerous prior omental and mesenteric tumor  implants have resolved completely. 2. 1.8 by 1.2 cm fluid density structure along the splenic hilum, no change from prior, likely a small benign cystic lesion.  3. Other imaging findings of potential clinical significance: 3 cm periampullary duodenal diverticulum.   01/04/16 CA125 34.1  01/10/2016 CT scan chest abdomen and pelvis. 1. New nodular density 12 x 7 mm ties are seen throughout the pelvis. The largest measures 12 x 10 mm best seen on image number 105 of series. A 9 mm nodule is seen in the left pelvis best seen on image number 60 of series 5. These are concerning for possible peritoneal implants. 2. Stable 1.9 cm cystic lesion seen in splenic hilum.  Vaginal biopsy performed on 01/04/2016 negative  She was started on PLD and carboplatin   05/11/2016 CT scan abdomen and pelvis IMPRESSION: 1. No acute findings identified within the abdomen or pelvis. 2. Peritoneal nodules within the right side of pelvis are stable the slightly increased in size from previous exam. No new areas of disease identified.  She has completed 6 cycles of  PLD and carboplatin therapy and imaging revealed progressive disease.   08/15/2016 CT scan abdomen and pelvis 1. Study demonstrates slight interval growth of several peritoneal implants in the low anatomic pelvis indicative of slight progression of disease. No other new peritoneal deposits are noted, and there is no other metastatic disease noted elsewhere in the chest, abdomen or pelvis. 2. Colonic diverticulosis without evidence of acute diverticulitis at this time. 3. Aortic atherosclerosis, in addition to left main coronary artery disease. Assessment for potential risk factor modification, dietary therapy or pharmacologic therapy may be warranted, if clinically  indicated. 4. Additional incidental findings, as above.  Rucaparib denied by insurance.   She was started on paclitaxel and bevacizumab on 09/24/2016.   CA125 02/20/2016 47.5 03/21/2016  31.1 05/16/2016 37.5 06/13/2016 44.8 06/27/2016 50.4 08/15/2016 64.5 09/24/2016 101.7 10/22/2016 18.2 11/05/2016 13. 0 11/26/2016 12.6   Genetic testing: ATM mutation [id number].  HRD testing negative ([facility])  I spoke with Dr. Lisbeth Ply at [facility] and he recommended genetic counseling for the patient and possibly other family members. He provided a phone number for them to call to schedule that appointment. The number is [phone].  Problem List: Patient Active Problem List   Diagnosis Date Noted  . Counseling regarding goals of care 11/26/2016  . Encounter for monitoring cardiotoxic drug therapy 02/02/2016  . Breast cancer in female [facility]) 12/13/2015  . Peptic ulcer disease 12/12/2015  . DDD (degenerative disc disease), lumbosacral 12/12/2015  . Hyperlipidemia 12/12/2015  . Acute anxiety 12/12/2015  . Insomnia 12/12/2015  . Allergic rhinitis 12/12/2015  . GERD (gastroesophageal reflux disease) 12/12/2015  . Internal hemorrhoid 12/12/2015  . OA (osteoarthritis) 12/12/2015  . Osteopenia 12/12/2015  . Hypothyroid 04/11/2015  . Benign essential HTN 02/01/2015  . Retroperitoneal fibrosis   . Combined fat and carbohydrate induced hyperlipemia 01/04/2015  . Awareness of heartbeats 01/04/2015  . Beat, premature ventricular 01/04/2015  . Malignant neoplasm of ovary ([place]) 12/16/2014  . MI (mitral incompetence) 09/02/2014  . AF (paroxysmal atrial fibrillation) ([place]) 09/02/2014    Past Medical History: Past Medical History:  Diagnosis Date  . Atrial fibrillation ([place])   . Cancer of breast ([place]) 1992   Left  . CINV (chemotherapy-induced nausea and vomiting)   . GERD (gastroesophageal reflux disease)   . Hyperlipidemia   . Hypothyroidism   . Mitral insufficiency   . Ovarian cancer [facility])    s/p BSO optimal tumor debulking May 2016  . PVC (premature ventricular contraction)     Past Surgical History: Past Surgical History:  Procedure Laterality Date  . ABDOMINAL HYSTERECTOMY     . BILATERAL SALPINGOOPHORECTOMY  May 2016   with optimal tumor debulking   . BREAST SURGERY     Left  . CESAREAN SECTION     x2  . CHOLECYSTECTOMY    . COLONOSCOPY  2006  . DEBULKING N/A 12/22/2014   Procedure: DEBULKING;  Surgeon: Gillis Ends, MD;  Location: [facility] ORS;  Service: Gynecology;  Laterality: N/A;  . LAPAROTOMY N/A 12/22/2014   Procedure: EXPLORATORY LAPAROTOMY;  Surgeon: Robert Bellow, MD;  Location: [facility] ORS;  Service: General;  Laterality: N/A;  . LAPAROTOMY WITH STAGING N/A 12/22/2014   Procedure: LAPAROTOMY WITH STAGING;  Surgeon: Gillis Ends, MD;  Location: [facility] ORS;  Service: Gynecology;  Laterality: N/A;  . PERIPHERAL VASCULAR CATHETERIZATION Left 12/22/2014   Procedure: PORTA CATH INSERTION;  Surgeon: Robert Bellow, MD;  Location: [facility] ORS;  Service: General;  Laterality: Left;  . PORTACATH PLACEMENT Right 12/22/2014   Procedure: INSERTION PORT-A-CATH;  Surgeon: Robert Bellow, MD;  Location: [facility] ORS;  Service: General;  Laterality: Right;  . SALPINGOOPHORECTOMY Bilateral 12/22/2014   Procedure: SALPINGO OOPHORECTOMY;  Surgeon: Gillis Ends, MD;  Location: [facility] ORS;  Service: Gynecology;  Laterality: Bilateral;  . UPPER GI ENDOSCOPY  09/25/04   hiatus hernia, schatzki ring and a single gastric polyp    Past Gynecologic History:  See HPI  OB History:  OB History  Gravida Para Term Preterm AB Living  2         2  SAB TAB Ectopic Multiple Live Births               # Outcome Date GA Lbr Len/2nd Weight Sex Delivery Anes PTL Lv  2 Gravida           1 Gravida             Obstetric Comments  1st Menstrual Cycle:  14  1st Pregnancy:  65    Family History: Family History  Problem Relation Age of Onset  . Cancer Mother     breast, throat, and stomach  . Breast cancer Mother   . Heart disease Father   . Pulmonary embolism Sister   . Cancer Brother 60    angiosarcoma of the chest; carcinoid small intestinal tumor  .  Hypertension Brother   . Cancer Maternal Aunt     breast cancer  . Cancer Maternal Grandfather 76    pancreatic    Social History: Social History   Social History  . Marital status: Married    Spouse name: N/A  . Number of children: N/A  . Years of education: N/A   Occupational History  . Not on file.   Social History Main Topics  . Smoking status: Never Smoker  . Smokeless tobacco: Never Used  . Alcohol use No  . Drug use: No  . Sexual activity: Not Currently   Other Topics Concern  . Not on file   Social History Narrative  . No narrative on file    Allergies: Allergies  Allergen Reactions  . Carafate [Sucralfate] Rash  . Sulfa Antibiotics Rash    Current Medications: Current Outpatient Prescriptions  Medication Sig Dispense Refill  . acetaminophen (TYLENOL) 500 MG chewable tablet Chew 500 mg by mouth every 6 (six) hours as needed for pain.    . chlorhexidine (PERIDEX) 0.12 % solution Use as directed 5 mLs in the mouth or throat as needed.     . Cholecalciferol (VITAMIN D) 2000 units tablet Take 1 tablet (2,000 Units total) by mouth daily. 30 tablet 12  . lactulose (CHRONULAC) 10 GM/15ML solution TAKE 15MLS (10G TOTAL) BY MOUTH THREE TIMES A DAY AS DIRECTED BY PHYSICIAN. 240 mL 1  . levothyroxine (SYNTHROID, LEVOTHROID) 50 MCG tablet Take 1 tablet (50 mcg total) by mouth daily. 30 tablet 12  . lidocaine-prilocaine (EMLA) cream Apply to port area 30-45 mins prior to chemo. 30 g 5  . LORazepam (ATIVAN) 0.5 MG tablet Take 0.5 tablets (0.25 mg total) by mouth at bedtime. 30 tablet 0  . Multiple Vitamin (MULTIVITAMIN) capsule Take 1 capsule by mouth daily.    . ondansetron (ZOFRAN) 4 MG tablet Take 1 tablet (4 mg total) by mouth every 8 (eight) hours as needed for nausea or vomiting. 30 tablet 3  . pantoprazole (PROTONIX) 40 MG tablet Take 1 tablet (40 mg total) by mouth daily. 30 tablet 12  . polyethylene glycol powder (GLYCOLAX/MIRALAX) powder TAKE 17GM (LINE INSIDE  CAP) IN EIGHT OUNCES OF WATER OR OTHER LIQUID DAILY 255 g 1  . prochlorperazine (COMPAZINE) 10 MG tablet Take 1 tablet (10 mg total) by mouth every 6 (six) hours as needed for nausea or vomiting. 30 tablet 0  . propafenone (RYTHMOL) 225 MG tablet Take 225 mg by mouth 2 (two) times daily.    . traMADol (ULTRAM) 50 MG tablet Take 1 tablet (50 mg total) by mouth every 6 (six) hours as needed. 30 tablet 0   No current facility-administered medications for this visit.  Facility-Administered Medications Ordered in Other Visits  Medication Dose Route Frequency Provider Last Rate Last Dose  . sodium chloride 0.9 % injection 10 mL  10 mL Intracatheter PRN Forest Gleason, MD   10 mL at 02/07/15 0930    Review of Systems General: fatigue and weakness  HEENT: voice changes and epitaxis  Lungs: negative for SOB or cough  Cardiac: no complaints  GI: abdominal bloating, constipation  GU: no complaints  Musculoskeletal: no complaints  Extremities: no complaints  Skin: no complaints  Neuro: peripheral neuropathy  Endocrine: no complaints  Psych: no complaints       Objective:  Physical Examination:  BP 112/76 (BP Location: Left Arm, Patient Position: Sitting)   Pulse 87   Temp 97.6 F (36.4 C) (Tympanic)   Ht 5' 3.5" (1.613 m)   Wt 189 lb 6.4 oz (85.9 kg)   BMI 33.02 kg/m . Weight is stable.    ECOG Performance Status: 0 - Asymptomatic  Physical Exam  Constitutional: She appears well-developed and well-nourished.  HENT:  Head: Normocephalic and atraumatic.  Eyes: Conjunctivae are normal.  Abdominal: Soft. Bowel sounds are normal. She exhibits no distension and no mass. No hernia.  Musculoskeletal: She exhibits no edema.  Lymphadenopathy:    She has no cervical adenopathy.  Neurological: She is alert. No cranial nerve deficit.  Skin: Skin is warm and dry.  Psychiatric: She has a normal mood and affect. Her behavior is normal.    Pelvic exam; Chaperoned by nurse: Pelvic: Vulva:  normal appearing vulva with no masses, tenderness or lesions; Vagina: normal left paravaginal cyst approximately mid portion of the vagina, on BME the left paravaginal cyst was stable and benign appearing; BME: negative for masses or nodularity beyond the vaginal cuff; Uterus and Cervix: surgically absent; Rectal: confirms. The previously palpated 1.5 cm nodule near right vaginal fornix was no longer appreciated  Lab Review Lab Results  Component Value Date   CA125 12.6 11/26/2016   CA125 13.0 11/05/2016   CA125 18.2 10/22/2016    Radiologic Imaging: none    Assessment:  Jamie Conner is a 76 y.o. female diagnosed with optimally debulked stage IIIc  high-grade serous ovarian cancer s/p chemotherapy with complete response. Recurrent ovarian cancer diagnosed 01/10/2016.  ATM mutation positive. HRD negative. Extensive family history of cancer (breast, pancreatic, throat, gastric, adenosarcoma of the chest, carcinoid tumor of the small bowel) in several first degree relatives.  Vaginal lesion, granulation tissue.  Recurrent platinum-sensitive ovarian cancer with stable disease on PLD/carboplatin based on RECIST criteria with ultimate progression. Platinum-resistant disease initiated on paclitaxel and bevacizumab with evidence of response based on exam and CA125. Tolerating therapy adequately.   Plan:   Problem List Items Addressed This Visit      Genitourinary   Malignant neoplasm of ovary ([place]) - Primary (Chronic)     Continue current therapy with Dr. Rogue Bussing. We will continue to follow closely with repeat exam in 3 months.   I encouraged her to talk to her daughter about seeing Lisbeth Ply, MD at [facility] regarding her ATM mutation and prophylactic options.    Gillis Ends, MD    CC:  Blain Pais, MD.

## 2016-12-05 NOTE — Progress Notes (Signed)
Patient here for follow up. She is currently receiving chemotherapy. Last treatment 12/03/2016.

## 2016-12-10 ENCOUNTER — Inpatient Hospital Stay: Payer: PPO

## 2016-12-10 ENCOUNTER — Inpatient Hospital Stay (HOSPITAL_BASED_OUTPATIENT_CLINIC_OR_DEPARTMENT_OTHER): Payer: PPO | Admitting: Internal Medicine

## 2016-12-10 VITALS — BP 122/77 | HR 68

## 2016-12-10 VITALS — BP 147/84 | HR 79 | Temp 97.0°F | Resp 20 | Ht 63.5 in | Wt 190.0 lb

## 2016-12-10 DIAGNOSIS — I493 Ventricular premature depolarization: Secondary | ICD-10-CM | POA: Diagnosis not present

## 2016-12-10 DIAGNOSIS — I700 Atherosclerosis of aorta: Secondary | ICD-10-CM

## 2016-12-10 DIAGNOSIS — I4891 Unspecified atrial fibrillation: Secondary | ICD-10-CM | POA: Diagnosis not present

## 2016-12-10 DIAGNOSIS — K1230 Oral mucositis (ulcerative), unspecified: Secondary | ICD-10-CM

## 2016-12-10 DIAGNOSIS — C569 Malignant neoplasm of unspecified ovary: Secondary | ICD-10-CM

## 2016-12-10 DIAGNOSIS — E785 Hyperlipidemia, unspecified: Secondary | ICD-10-CM

## 2016-12-10 DIAGNOSIS — Z79899 Other long term (current) drug therapy: Secondary | ICD-10-CM | POA: Diagnosis not present

## 2016-12-10 DIAGNOSIS — K5730 Diverticulosis of large intestine without perforation or abscess without bleeding: Secondary | ICD-10-CM | POA: Diagnosis not present

## 2016-12-10 DIAGNOSIS — I340 Nonrheumatic mitral (valve) insufficiency: Secondary | ICD-10-CM

## 2016-12-10 DIAGNOSIS — Z5111 Encounter for antineoplastic chemotherapy: Secondary | ICD-10-CM | POA: Diagnosis not present

## 2016-12-10 DIAGNOSIS — K219 Gastro-esophageal reflux disease without esophagitis: Secondary | ICD-10-CM

## 2016-12-10 DIAGNOSIS — C786 Secondary malignant neoplasm of retroperitoneum and peritoneum: Secondary | ICD-10-CM | POA: Diagnosis not present

## 2016-12-10 DIAGNOSIS — Z800 Family history of malignant neoplasm of digestive organs: Secondary | ICD-10-CM

## 2016-12-10 DIAGNOSIS — R51 Headache: Secondary | ICD-10-CM

## 2016-12-10 DIAGNOSIS — Z90722 Acquired absence of ovaries, bilateral: Secondary | ICD-10-CM

## 2016-12-10 DIAGNOSIS — Z853 Personal history of malignant neoplasm of breast: Secondary | ICD-10-CM

## 2016-12-10 DIAGNOSIS — Z90710 Acquired absence of both cervix and uterus: Secondary | ICD-10-CM

## 2016-12-10 DIAGNOSIS — Z803 Family history of malignant neoplasm of breast: Secondary | ICD-10-CM

## 2016-12-10 LAB — COMPREHENSIVE METABOLIC PANEL
ALBUMIN: 3.3 g/dL — AB (ref 3.5–5.0)
ALK PHOS: 56 U/L (ref 38–126)
ALT: 16 U/L (ref 14–54)
AST: 19 U/L (ref 15–41)
Anion gap: 6 (ref 5–15)
BILIRUBIN TOTAL: 0.7 mg/dL (ref 0.3–1.2)
BUN: 15 mg/dL (ref 6–20)
CO2: 25 mmol/L (ref 22–32)
Calcium: 9 mg/dL (ref 8.9–10.3)
Chloride: 105 mmol/L (ref 101–111)
Creatinine, Ser: 0.83 mg/dL (ref 0.44–1.00)
GFR calc Af Amer: >60 mL/min (ref >60)
Glucose, Bld: 87 mg/dL (ref 65–99)
POTASSIUM: 4.3 mmol/L (ref 3.5–5.1)
Sodium: 136 mmol/L (ref 135–145)
TOTAL PROTEIN: 6.1 g/dL — AB (ref 6.5–8.1)

## 2016-12-10 LAB — CBC WITH DIFFERENTIAL/PLATELET
Basophils Absolute: 0 10*3/uL (ref 0–0.1)
Basophils Relative: 1 %
Eosinophils Absolute: 0.1 10*3/uL (ref 0–0.7)
Eosinophils Relative: 1 %
HEMATOCRIT: 34.6 % — AB (ref 35.0–47.0)
HEMOGLOBIN: 12 g/dL (ref 12.0–16.0)
LYMPHS ABS: 2.7 10*3/uL (ref 1.0–3.6)
Lymphocytes Relative: 60 %
MCH: 33.8 pg (ref 26.0–34.0)
MCHC: 34.6 g/dL (ref 32.0–36.0)
MCV: 97.6 fL (ref 80.0–100.0)
MONO ABS: 0.4 10*3/uL (ref 0.2–0.9)
MONOS PCT: 9 %
NEUTROS PCT: 29 %
Neutro Abs: 1.3 10*3/uL — ABNORMAL LOW (ref 1.4–6.5)
Platelets: 235 10*3/uL (ref 150–440)
RBC: 3.55 MIL/uL — ABNORMAL LOW (ref 3.80–5.20)
RDW: 15.7 % — ABNORMAL HIGH (ref 11.5–14.5)
WBC: 4.6 10*3/uL (ref 3.6–11.0)

## 2016-12-10 LAB — URINALYSIS, COMPLETE (UACMP) WITH MICROSCOPIC
BACTERIA UA: NONE SEEN
BILIRUBIN URINE: NEGATIVE
GLUCOSE, UA: NEGATIVE mg/dL
HGB URINE DIPSTICK: NEGATIVE
KETONES UR: NEGATIVE mg/dL
LEUKOCYTES UA: NEGATIVE
NITRITE: NEGATIVE
PROTEIN: NEGATIVE mg/dL
RBC / HPF: NONE SEEN RBC/hpf (ref 0–5)
Specific Gravity, Urine: 1.009 (ref 1.005–1.030)
pH: 5 (ref 5.0–8.0)

## 2016-12-10 MED ORDER — HEPARIN SOD (PORK) LOCK FLUSH 100 UNIT/ML IV SOLN
500.0000 [IU] | Freq: Once | INTRAVENOUS | Status: AC
  Administered 2016-12-10: 500 [IU] via INTRAVENOUS
  Filled 2016-12-10: qty 5

## 2016-12-10 MED ORDER — DIPHENHYDRAMINE HCL 50 MG/ML IJ SOLN
25.0000 mg | Freq: Once | INTRAMUSCULAR | Status: AC
  Administered 2016-12-10: 25 mg via INTRAVENOUS
  Filled 2016-12-10: qty 1

## 2016-12-10 MED ORDER — HEPARIN SOD (PORK) LOCK FLUSH 100 UNIT/ML IV SOLN: 500.0000 [IU] | Freq: Once | INTRAVENOUS | Status: DC | PRN

## 2016-12-10 MED ORDER — SODIUM CHLORIDE 0.9% FLUSH
10.0000 mL | Freq: Once | INTRAVENOUS | Status: AC
  Administered 2016-12-10: 10 mL via INTRAVENOUS
  Filled 2016-12-10: qty 10

## 2016-12-10 MED ORDER — SODIUM CHLORIDE 0.9 % IV SOLN
800.0000 mg | Freq: Once | INTRAVENOUS | Status: AC
  Administered 2016-12-10: 800 mg via INTRAVENOUS
  Filled 2016-12-10: qty 32

## 2016-12-10 MED ORDER — FAMOTIDINE IN NACL 20-0.9 MG/50ML-% IV SOLN
20.0000 mg | Freq: Once | INTRAVENOUS | Status: AC
  Administered 2016-12-10: 20 mg via INTRAVENOUS
  Filled 2016-12-10: qty 50

## 2016-12-10 MED ORDER — SODIUM CHLORIDE 0.9 % IV SOLN
Freq: Once | INTRAVENOUS | Status: AC
  Administered 2016-12-10: 12:00:00 via INTRAVENOUS
  Filled 2016-12-10: qty 1000

## 2016-12-10 NOTE — Progress Notes (Signed)
[place] OFFICE PROGRESS NOTE  Patient Care Team: Jerrol Banana., MD as PCP - General (Family Medicine) [facility] Gaetana Michaelis, MD as Referring Physician (Obstetrics and Gynecology) Robert Bellow, MD as Consulting Physician (General Surgery)  Ovarian cancer [facility])   Staging form: Ovary, AJCC 7th Edition     Clinical: Stage IIIC (T3c, N0, M0) - Unsigned    Oncology History   # April- MAY 2016- OVARIAN CANCER STAGE IIIC; CA-125 +2300+;  s/p OPTIMAL DEBULKING SURGERY   # MAY 2016[name] [finished in Sep 19th 2017]  # MAY CT 2017- RECURRENT/Peritoneal carcinomatosis/pelvic implant ~1.2cm/Ca-125-34;   # July 3rd- CARBO-DOXIL q 4W [with neulasta]; CT May 11 2016- slight increase peritoneal / stable nodules. Cont chemo [finished DEC 2017]. DEC 28th 2017 CT- Increase in peritoneal deposits; increasing Ca 125 [60s]  # G-1-2 hand foot syndrome-   # LEFT BREAST CA s/p Lumpec & RT s/p TAM   # Afib [cardiology]; JUNE 2017- MUGA 62%  MOLECULAR TESTING- ATM genetic mutation; TUMOR BRCA- NEG; Myriad genomic instability- NEGATIVE     Ovarian cancer, unspecified laterality ([place])   12/16/2014 Initial Diagnosis    Ovarian cancer       INTERVAL HISTORY:  76 year old female patient with above history of ovarian cancer recurrent Platinum Refractory ovarian cancer [based on CT scan in December 2017]- is here for follow-up; Patient is currently on Avastin and Taxol. Currently status post cycle #3 day-1 Approximately 2 weeks ago.  In the interim patient was evaluated by gynecology oncology- pelvic exam revealed improvement of the peritoneal nodularity.   Patient continues to complain of soreness in the mouth/ sores angle of the mouth bilaterally. Otherwise denies any significant nausea vomiting. Denies any significant tingling or numbness.  Denies any fatigue. Denies any abdominal pain. Denies any abdominal distention. No chest pain or shortness of breath or  cough.No headaches.   REVIEW OF SYSTEMS:  A complete 10 point review of system is done which is negative except mentioned above/history of present illness.   PAST MEDICAL HISTORY :  Past Medical History:  Diagnosis Date  . Atrial fibrillation ([place])   . Cancer of breast ([place]) 1992   Left  . CINV (chemotherapy-induced nausea and vomiting)   . GERD (gastroesophageal reflux disease)   . Hyperlipidemia   . Hypothyroidism   . Mitral insufficiency   . Ovarian cancer [facility])    s/p BSO optimal tumor debulking May 2016  . PVC (premature ventricular contraction)     PAST SURGICAL HISTORY :   Past Surgical History:  Procedure Laterality Date  . ABDOMINAL HYSTERECTOMY    . BILATERAL SALPINGOOPHORECTOMY  May 2016   with optimal tumor debulking   . BREAST SURGERY     Left  . CESAREAN SECTION     x2  . CHOLECYSTECTOMY    . COLONOSCOPY  2006  . DEBULKING N/A 12/22/2014   Procedure: DEBULKING;  Surgeon: Gillis Ends, MD;  Location: [facility] ORS;  Service: Gynecology;  Laterality: N/A;  . LAPAROTOMY N/A 12/22/2014   Procedure: EXPLORATORY LAPAROTOMY;  Surgeon: Robert Bellow, MD;  Location: [facility] ORS;  Service: General;  Laterality: N/A;  . LAPAROTOMY WITH STAGING N/A 12/22/2014   Procedure: LAPAROTOMY WITH STAGING;  Surgeon: Gillis Ends, MD;  Location: [facility] ORS;  Service: Gynecology;  Laterality: N/A;  . PERIPHERAL VASCULAR CATHETERIZATION Left 12/22/2014   Procedure: PORTA CATH INSERTION;  Surgeon: Robert Bellow, MD;  Location: [facility] ORS;  Service: General;  Laterality:  Left;  . PORTACATH PLACEMENT Right 12/22/2014   Procedure: INSERTION PORT-A-CATH;  Surgeon: Robert Bellow, MD;  Location: [facility] ORS;  Service: General;  Laterality: Right;  . SALPINGOOPHORECTOMY Bilateral 12/22/2014   Procedure: SALPINGO OOPHORECTOMY;  Surgeon: Gillis Ends, MD;  Location: [facility] ORS;  Service: Gynecology;  Laterality: Bilateral;  . UPPER GI ENDOSCOPY  09/25/04   hiatus hernia, schatzki  ring and a single gastric polyp    FAMILY HISTORY :   Family History  Problem Relation Age of Onset  . Cancer Mother     breast, throat, and stomach  . Breast cancer Mother   . Heart disease Father   . Pulmonary embolism Sister   . Cancer Brother 60    angiosarcoma of the chest; carcinoid small intestinal tumor  . Hypertension Brother   . Cancer Maternal Aunt     breast cancer  . Cancer Maternal Grandfather 56    pancreatic    SOCIAL HISTORY:   Social History  Substance Use Topics  . Smoking status: Never Smoker  . Smokeless tobacco: Never Used  . Alcohol use No    ALLERGIES:  is allergic to carafate [sucralfate] and sulfa antibiotics.  MEDICATIONS:  Current Outpatient Prescriptions  Medication Sig Dispense Refill  . acetaminophen (TYLENOL) 500 MG chewable tablet Chew 500 mg by mouth every 6 (six) hours as needed for pain.    . Cholecalciferol (VITAMIN D) 2000 units tablet Take 1 tablet (2,000 Units total) by mouth daily. 30 tablet 12  . levothyroxine (SYNTHROID, LEVOTHROID) 50 MCG tablet Take 1 tablet (50 mcg total) by mouth daily. 30 tablet 12  . lidocaine-prilocaine (EMLA) cream Apply to port area 30-45 mins prior to chemo. 30 g 5  . LORazepam (ATIVAN) 0.5 MG tablet Take 0.5 tablets (0.25 mg total) by mouth at bedtime. 30 tablet 0  . Multiple Vitamin (MULTIVITAMIN) capsule Take 1 capsule by mouth daily.    . ondansetron (ZOFRAN) 4 MG tablet Take 1 tablet (4 mg total) by mouth every 8 (eight) hours as needed for nausea or vomiting. 30 tablet 3  . pantoprazole (PROTONIX) 40 MG tablet Take 1 tablet (40 mg total) by mouth daily. 30 tablet 12  . polyethylene glycol powder (GLYCOLAX/MIRALAX) powder TAKE 17GM (LINE INSIDE CAP) IN EIGHT OUNCES OF WATER OR OTHER LIQUID DAILY 255 g 1  . propafenone (RYTHMOL) 225 MG tablet Take 225 mg by mouth 2 (two) times daily.    . chlorhexidine (PERIDEX) 0.12 % solution Use as directed 5 mLs in the mouth or throat as needed.     . lactulose  (CHRONULAC) 10 GM/15ML solution TAKE 15MLS (10G TOTAL) BY MOUTH THREE TIMES A DAY AS DIRECTED BY PHYSICIAN. (Patient not taking: Reported on 12/10/2016) 240 mL 1  . prochlorperazine (COMPAZINE) 10 MG tablet Take 1 tablet (10 mg total) by mouth every 6 (six) hours as needed for nausea or vomiting. (Patient not taking: Reported on 12/10/2016) 30 tablet 0  . traMADol (ULTRAM) 50 MG tablet Take 1 tablet (50 mg total) by mouth every 6 (six) hours as needed. (Patient not taking: Reported on 12/10/2016) 30 tablet 0   No current facility-administered medications for this visit.    Facility-Administered Medications Ordered in Other Visits  Medication Dose Route Frequency Provider Last Rate Last Dose  . sodium chloride 0.9 % injection 10 mL  10 mL Intracatheter PRN Forest Gleason, MD   10 mL at 02/07/15 0930    PHYSICAL EXAMINATION: ECOG PERFORMANCE STATUS: 0 - Asymptomatic  BP Marland Kitchen)  147/84   Pulse 79   Temp 97 F (36.1 C) (Tympanic)   Resp 20   Ht 5' 3.5" (1.613 m)   Wt 190 lb (86.2 kg)   BMI 33.13 kg/m   Filed Weights   12/10/16 1054  Weight: 190 lb (86.2 kg)    GENERAL: Well-nourished well-developed; Alert, no distress and comfortAccompanied by her husband. EYES: no pallor or icterus OROPHARYNX: no thrush or ulceration; good dentition  NECK: supple, no masses felt LYMPH:  no palpable lymphadenopathy in the cervical, axillary or inguinal regions LUNGS: clear to auscultation and  No wheeze or crackles HEART/CVS: regular rate & rhythm and no murmurs; No lower extremity edema ABDOMEN:abdomen soft, non-tender and normal bowel sounds Musculoskeletal:no cyanosis of digits and no clubbing  PSYCH: alert & oriented x 3 with fluent speech NEURO: no focal motor/sensory deficits SKIN:  Normal. No rash noted.   LABORATORY DATA:  I have reviewed the data as listed    Component Value Date/Time   NA 136 12/10/2016 1036   NA 140 12/14/2014 1037   K 4.3 12/10/2016 1036   K 4.4 12/14/2014 1037   CL  105 12/10/2016 1036   CL 106 12/14/2014 1037   CO2 25 12/10/2016 1036   CO2 26 12/14/2014 1037   GLUCOSE 87 12/10/2016 1036   GLUCOSE 87 12/14/2014 1037   BUN 15 12/10/2016 1036   BUN 12 12/14/2014 1037   CREATININE 0.83 12/10/2016 1036   CREATININE 0.97 12/14/2014 1037   CALCIUM 9.0 12/10/2016 1036   CALCIUM 9.2 12/14/2014 1037   PROT 6.1 (L) 12/10/2016 1036   PROT 7.3 12/14/2014 1037   ALBUMIN 3.3 (L) 12/10/2016 1036   ALBUMIN 3.8 12/14/2014 1037   AST 19 12/10/2016 1036   AST 20 12/14/2014 1037   ALT 16 12/10/2016 1036   ALT 11 (L) 12/14/2014 1037   ALKPHOS 56 12/10/2016 1036   ALKPHOS 138 (H) 12/14/2014 1037   BILITOT 0.7 12/10/2016 1036   BILITOT 0.5 12/14/2014 1037   GFRNONAA >60 12/10/2016 1036   GFRNONAA 59 (L) 12/14/2014 1037   GFRAA >60 12/10/2016 1036   GFRAA >60 12/14/2014 1037    No results found for: SPEP, UPEP  Lab Results  Component Value Date   WBC 4.6 12/10/2016   NEUTROABS 1.3 (L) 12/10/2016   HGB 12.0 12/10/2016   HCT 34.6 (L) 12/10/2016   MCV 97.6 12/10/2016   PLT 235 12/10/2016      Chemistry      Component Value Date/Time   NA 136 12/10/2016 1036   NA 140 12/14/2014 1037   K 4.3 12/10/2016 1036   K 4.4 12/14/2014 1037   CL 105 12/10/2016 1036   CL 106 12/14/2014 1037   CO2 25 12/10/2016 1036   CO2 26 12/14/2014 1037   BUN 15 12/10/2016 1036   BUN 12 12/14/2014 1037   CREATININE 0.83 12/10/2016 1036   CREATININE 0.97 12/14/2014 1037   GLU 84 03/16/2014      Component Value Date/Time   CALCIUM 9.0 12/10/2016 1036   CALCIUM 9.2 12/14/2014 1037   ALKPHOS 56 12/10/2016 1036   ALKPHOS 138 (H) 12/14/2014 1037   AST 19 12/10/2016 1036   AST 20 12/14/2014 1037   ALT 16 12/10/2016 1036   ALT 11 (L) 12/14/2014 1037   BILITOT 0.7 12/10/2016 1036   BILITOT 0.5 12/14/2014 1037    IMPRESSION: 1. Today's study demonstrates slight interval growth of several peritoneal implants in the low anatomic pelvis indicative of slight progression  of disease. No other new peritoneal deposits are noted, and there is no other metastatic disease noted elsewhere in the chest, abdomen or pelvis. 2. Colonic diverticulosis without evidence of acute diverticulitis at this time. 3. Aortic atherosclerosis, in addition to left main coronary artery disease. Assessment for potential risk factor modification, dietary therapy or pharmacologic therapy may be warranted, if clinically indicated. 4. Additional incidental findings, as above.   Electronically Signed   By: Vinnie Langton M.D.   On: 08/15/2016 14:08   RADIOGRAPHIC STUDIES: I have personally reviewed the radiological images as listed and agreed with the findings in the report. No results found.   ASSESSMENT & PLAN:  Ovarian cancer, unspecified laterality ([place]) Recurrent  Platinum refractory ovarian cancer [dec 2017] slight increase in the CA-125. DEC 2017-  CT scan shows slight increase in the peritoneal deposits in the pelvis. Ca-125 improving; exam [with Dr.Secord Improved]; Currently On Taxol- Avastin [day1,8 & 15 every 28 days [off-day 22]; Avastin every 2 weeks- tolerating well; except for mucositis [see discussion below]  Proceed with cycle #3; day-15 Avastin only today. HOLD Taxol today [sec to mucositis]; discussed continuing Taxol-with Avastin versus Avastin alone based on the CT scan results.  # Mucositis- G-2; salt/baking soda rinses; recommend abrevia.   # awaiting eval at [facility] re: ATM gene mutation.   # follow up in 2 weeks/ labs-UA; ca 125/ Taxol-Avastin CT scan prior to MD visit.    Orders Placed This Encounter  Procedures  . CT ABDOMEN PELVIS W CONTRAST    Standing Status:   Future    Standing Expiration Date:   03/11/2018    Order Specific Question:   Reason for Exam (SYMPTOM  OR DIAGNOSIS REQUIRED)    Answer:   ovarian cancer with peritoneal mets    Order Specific Question:   Preferred imaging location?    Answer:   [facility]  . CBC with  Differential/Platelet    Standing Status:   Future    Standing Expiration Date:   12/10/2017  . Comprehensive metabolic panel    Standing Status:   Future    Standing Expiration Date:   12/10/2017  . Urinalysis, Complete w Microscopic    Standing Status:   Future    Standing Expiration Date:   01/09/2017   All questions were answered. The patient knows to call the clinic with any problems, questions or concerns.      Cammie Sickle, MD 12/10/2016 1:11 PM

## 2016-12-10 NOTE — Assessment & Plan Note (Addendum)
Recurrent  Platinum refractory ovarian cancer [dec 2017] slight increase in the CA-125. DEC 2017-  CT scan shows slight increase in the peritoneal deposits in the pelvis. Ca-125 improving; exam [with Dr.Secord Improved]; Currently On Taxol- Avastin [day1,8 & 15 every 28 days [off-day 22]; Avastin every 2 weeks- tolerating well; except for mucositis [see discussion below]  Proceed with cycle #3; day-15 Avastin only today. HOLD Taxol today [sec to mucositis]; discussed continuing Taxol-with Avastin versus Avastin alone based on the CT scan results.  # Mucositis- G-2; salt/baking soda rinses; recommend abrevia.   # awaiting eval at [facility] re: ATM gene mutation.   # follow up in 2 weeks/ labs-UA; ca 125/ Taxol-Avastin CT scan prior to MD visit.

## 2016-12-10 NOTE — Progress Notes (Signed)
Patient here for follow-up for ovarian cancer.  Pt c/o canker sores in corners of her mouth bilaterally.

## 2016-12-13 ENCOUNTER — Ambulatory Visit: Payer: PPO

## 2016-12-18 ENCOUNTER — Ambulatory Visit (INDEPENDENT_AMBULATORY_CARE_PROVIDER_SITE_OTHER): Payer: PPO | Admitting: Family Medicine

## 2016-12-18 VITALS — BP 102/60 | HR 96 | Temp 97.7°F | Resp 18 | Wt 189.0 lb

## 2016-12-18 DIAGNOSIS — K219 Gastro-esophageal reflux disease without esophagitis: Secondary | ICD-10-CM | POA: Diagnosis not present

## 2016-12-18 DIAGNOSIS — I10 Essential (primary) hypertension: Secondary | ICD-10-CM

## 2016-12-18 DIAGNOSIS — K130 Diseases of lips: Secondary | ICD-10-CM | POA: Diagnosis not present

## 2016-12-18 DIAGNOSIS — C569 Malignant neoplasm of unspecified ovary: Secondary | ICD-10-CM

## 2016-12-18 DIAGNOSIS — E038 Other specified hypothyroidism: Secondary | ICD-10-CM | POA: Diagnosis not present

## 2016-12-18 NOTE — Progress Notes (Signed)
Jamie Conner  MRN: [id number] DOB: 1941/08/15  Subjective:  HPI  Patient has sores in her mouth. Per patient " acts like the thrush but her tongue is not white." Feels better today patient states. IT does hurt to eat and drink on sides of her mouth. She is going through chemo, this week is her off week. She goes 3 weeks in a row and then 1 week off. Patient Active Problem List   Diagnosis Date Noted  . Counseling regarding goals of care 11/26/2016  . Encounter for monitoring cardiotoxic drug therapy 02/02/2016  . Breast cancer in female [facility]) 12/13/2015  . Peptic ulcer disease 12/12/2015  . DDD (degenerative disc disease), lumbosacral 12/12/2015  . Hyperlipidemia 12/12/2015  . Acute anxiety 12/12/2015  . Insomnia 12/12/2015  . Allergic rhinitis 12/12/2015  . GERD (gastroesophageal reflux disease) 12/12/2015  . Internal hemorrhoid 12/12/2015  . OA (osteoarthritis) 12/12/2015  . Osteopenia 12/12/2015  . Hypothyroid 04/11/2015  . Benign essential HTN 02/01/2015  . Retroperitoneal fibrosis   . Combined fat and carbohydrate induced hyperlipemia 01/04/2015  . Awareness of heartbeats 01/04/2015  . Beat, premature ventricular 01/04/2015  . Ovarian cancer, unspecified laterality ([place]) 12/16/2014  . MI (mitral incompetence) 09/02/2014  . AF (paroxysmal atrial fibrillation) ([place]) 09/02/2014    Past Medical History:  Diagnosis Date  . Atrial fibrillation ([place])   . Cancer of breast ([place]) 1992   Left  . CINV (chemotherapy-induced nausea and vomiting)   . GERD (gastroesophageal reflux disease)   . Hyperlipidemia   . Hypothyroidism   . Mitral insufficiency   . Ovarian cancer [facility])    s/p BSO optimal tumor debulking May 2016  . PVC (premature ventricular contraction)     Social History   Social History  . Marital status: Married    Spouse name: N/A  . Number of children: N/A  . Years of education: N/A   Occupational History  . Not on file.   Social History Main  Topics  . Smoking status: Never Smoker  . Smokeless tobacco: Never Used  . Alcohol use No  . Drug use: No  . Sexual activity: Not Currently   Other Topics Concern  . Not on file   Social History Narrative  . No narrative on file    Outpatient Encounter Prescriptions as of 12/18/2016  Medication Sig Note  . acetaminophen (TYLENOL) 500 MG chewable tablet Chew 500 mg by mouth every 6 (six) hours as needed for pain. 11/05/2016: Last taken on Thursday 11/01/16  . Cholecalciferol (VITAMIN D) 2000 units tablet Take 1 tablet (2,000 Units total) by mouth daily.   Marland Kitchen lactulose (CHRONULAC) 10 GM/15ML solution TAKE 15MLS (10G TOTAL) BY MOUTH THREE TIMES A DAY AS DIRECTED BY PHYSICIAN. 10/08/2016: Last taken during the week of last avastin tx  . levothyroxine (SYNTHROID, LEVOTHROID) 50 MCG tablet Take 1 tablet (50 mcg total) by mouth daily.   Marland Kitchen lidocaine-prilocaine (EMLA) cream Apply to port area 30-45 mins prior to chemo.   Marland Kitchen LORazepam (ATIVAN) 0.5 MG tablet Take 0.5 tablets (0.25 mg total) by mouth at bedtime.   . Multiple Vitamin (MULTIVITAMIN) capsule Take 1 capsule by mouth daily.   . ondansetron (ZOFRAN) 4 MG tablet Take 1 tablet (4 mg total) by mouth every 8 (eight) hours as needed for nausea or vomiting.   . pantoprazole (PROTONIX) 40 MG tablet Take 1 tablet (40 mg total) by mouth daily.   . polyethylene glycol powder (GLYCOLAX/MIRALAX) powder TAKE 17GM (LINE INSIDE CAP) IN EIGHT  OUNCES OF WATER OR OTHER LIQUID DAILY   . propafenone (RYTHMOL) 225 MG tablet Take 225 mg by mouth 2 (two) times daily.   . prochlorperazine (COMPAZINE) 10 MG tablet Take 1 tablet (10 mg total) by mouth every 6 (six) hours as needed for nausea or vomiting. (Patient not taking: Reported on 12/10/2016)   . traMADol (ULTRAM) 50 MG tablet Take 1 tablet (50 mg total) by mouth every 6 (six) hours as needed. (Patient not taking: Reported on 12/10/2016)   . [DISCONTINUED] chlorhexidine (PERIDEX) 0.12 % solution Use as directed 5 mLs  in the mouth or throat as needed.  06/11/2016: Received from: External Pharmacy   Facility-Administered Encounter Medications as of 12/18/2016  Medication  . sodium chloride 0.9 % injection 10 mL    Allergies  Allergen Reactions  . Carafate [Sucralfate] Rash  . Sulfa Antibiotics Rash    Review of Systems  Constitutional: Positive for malaise/fatigue.  HENT:       Sores in her mouth  Respiratory: Negative.   Cardiovascular: Negative.   Gastrointestinal: Negative.   Musculoskeletal: Positive for joint pain.  Neurological: Positive for weakness. Negative for dizziness.       Sometimes unsteady balance.  Psychiatric/Behavioral: Positive for depression (slightly).       Some trouble concentrating, patient is going through chemo treatments right now    Objective:  BP 102/60   Pulse 96   Temp 97.7 F (36.5 C)   Resp 18   Wt 189 lb (85.7 kg)   BMI 32.95 kg/m   Physical Exam  Constitutional: She is oriented to person, place, and time and well-developed, well-nourished, and in no distress.  HENT:  Cheilitis-corners of her mouth  Eyes: Conjunctivae are normal. Pupils are equal, round, and reactive to light.  Neck: Normal range of motion. Neck supple. No thyromegaly present.  Cardiovascular: Normal rate, regular rhythm, normal heart sounds and intact distal pulses.   No murmur heard. Pulmonary/Chest: Effort normal and breath sounds normal. No respiratory distress. She has no wheezes.  Neurological: She is alert and oriented to person, place, and time.   Assessment and Plan :  1. Angular cheilitis Most likely from the chemotherapy. Advised patient to take Lysine 1 tablet daily over the counter. Also use Abreva cream if needed. Follow as needed for this.  2. Ovarian cancer, unspecified laterality ([facility])--metastatic Taking chemo.  3. Other specified hypothyroidism  4. Gastroesophageal reflux disease, esophagitis presence not specified Stable.  HPI, Exam and A&P transcribed by  Theressa Millard, RMA under direction and in the presence of Miguel Aschoff, MD. I have done the exam and reviewed the chart and it is accurate to the best of my knowledge. Development worker, community has been used and  any errors in dictation or transcription are unintentional. Miguel Aschoff M.D. [facility]

## 2016-12-18 NOTE — Patient Instructions (Addendum)
Take Lysine over the counter 1 daily. Also use Abreva ointment as needed.

## 2016-12-21 ENCOUNTER — Ambulatory Visit
Admission: RE | Admit: 2016-12-21 | Discharge: 2016-12-21 | Disposition: A | Discharge status: Home / Self Care | Payer: PPO | Source: Ambulatory Visit | Attending: Internal Medicine | Admitting: Internal Medicine

## 2016-12-21 ENCOUNTER — Encounter: Payer: Self-pay | Admitting: Internal Medicine

## 2016-12-21 ENCOUNTER — Telehealth: Payer: Self-pay | Admitting: Family Medicine

## 2016-12-21 DIAGNOSIS — C569 Malignant neoplasm of unspecified ovary: Secondary | ICD-10-CM | POA: Diagnosis not present

## 2016-12-21 DIAGNOSIS — I700 Atherosclerosis of aorta: Secondary | ICD-10-CM | POA: Insufficient documentation

## 2016-12-21 DIAGNOSIS — K5730 Diverticulosis of large intestine without perforation or abscess without bleeding: Secondary | ICD-10-CM | POA: Insufficient documentation

## 2016-12-21 DIAGNOSIS — K449 Diaphragmatic hernia without obstruction or gangrene: Secondary | ICD-10-CM | POA: Diagnosis not present

## 2016-12-21 DIAGNOSIS — C786 Secondary malignant neoplasm of retroperitoneum and peritoneum: Secondary | ICD-10-CM | POA: Diagnosis not present

## 2016-12-21 DIAGNOSIS — K769 Liver disease, unspecified: Secondary | ICD-10-CM | POA: Diagnosis not present

## 2016-12-21 MED ORDER — IOPAMIDOL (ISOVUE-300) INJECTION 61%
100.0000 mL | Freq: Once | INTRAVENOUS | Status: AC | PRN
  Administered 2016-12-21: 100 mL via INTRAVENOUS

## 2016-12-21 NOTE — Telephone Encounter (Signed)
Called to r/s AWV with patient, she declined this visit at this time. - Tiffany Hill,LPN

## 2016-12-24 ENCOUNTER — Inpatient Hospital Stay (HOSPITAL_BASED_OUTPATIENT_CLINIC_OR_DEPARTMENT_OTHER): Payer: PPO | Admitting: Internal Medicine

## 2016-12-24 ENCOUNTER — Inpatient Hospital Stay: Payer: PPO

## 2016-12-24 ENCOUNTER — Inpatient Hospital Stay: Payer: PPO | Attending: Internal Medicine

## 2016-12-24 VITALS — BP 126/81 | HR 74 | Temp 97.6°F | Resp 18 | Ht 63.5 in | Wt 188.9 lb

## 2016-12-24 DIAGNOSIS — Z800 Family history of malignant neoplasm of digestive organs: Secondary | ICD-10-CM | POA: Diagnosis not present

## 2016-12-24 DIAGNOSIS — Z803 Family history of malignant neoplasm of breast: Secondary | ICD-10-CM

## 2016-12-24 DIAGNOSIS — C569 Malignant neoplasm of unspecified ovary: Secondary | ICD-10-CM

## 2016-12-24 DIAGNOSIS — K5730 Diverticulosis of large intestine without perforation or abscess without bleeding: Secondary | ICD-10-CM

## 2016-12-24 DIAGNOSIS — Z79899 Other long term (current) drug therapy: Secondary | ICD-10-CM

## 2016-12-24 DIAGNOSIS — I4891 Unspecified atrial fibrillation: Secondary | ICD-10-CM | POA: Diagnosis not present

## 2016-12-24 DIAGNOSIS — E039 Hypothyroidism, unspecified: Secondary | ICD-10-CM

## 2016-12-24 DIAGNOSIS — I340 Nonrheumatic mitral (valve) insufficiency: Secondary | ICD-10-CM | POA: Diagnosis not present

## 2016-12-24 DIAGNOSIS — K1230 Oral mucositis (ulcerative), unspecified: Secondary | ICD-10-CM | POA: Diagnosis not present

## 2016-12-24 DIAGNOSIS — K219 Gastro-esophageal reflux disease without esophagitis: Secondary | ICD-10-CM

## 2016-12-24 DIAGNOSIS — I493 Ventricular premature depolarization: Secondary | ICD-10-CM | POA: Diagnosis not present

## 2016-12-24 DIAGNOSIS — I700 Atherosclerosis of aorta: Secondary | ICD-10-CM | POA: Insufficient documentation

## 2016-12-24 DIAGNOSIS — E785 Hyperlipidemia, unspecified: Secondary | ICD-10-CM

## 2016-12-24 DIAGNOSIS — K449 Diaphragmatic hernia without obstruction or gangrene: Secondary | ICD-10-CM | POA: Insufficient documentation

## 2016-12-24 DIAGNOSIS — C787 Secondary malignant neoplasm of liver and intrahepatic bile duct: Secondary | ICD-10-CM | POA: Insufficient documentation

## 2016-12-24 DIAGNOSIS — Z853 Personal history of malignant neoplasm of breast: Secondary | ICD-10-CM | POA: Insufficient documentation

## 2016-12-24 DIAGNOSIS — Z5111 Encounter for antineoplastic chemotherapy: Secondary | ICD-10-CM | POA: Insufficient documentation

## 2016-12-24 DIAGNOSIS — G4700 Insomnia, unspecified: Secondary | ICD-10-CM

## 2016-12-24 LAB — URINALYSIS, COMPLETE (UACMP) WITH MICROSCOPIC
BACTERIA UA: NONE SEEN
BILIRUBIN URINE: NEGATIVE
Glucose, UA: NEGATIVE mg/dL
Hgb urine dipstick: NEGATIVE
KETONES UR: NEGATIVE mg/dL
NITRITE: NEGATIVE
Protein, ur: NEGATIVE mg/dL
SPECIFIC GRAVITY, URINE: 1.005 (ref 1.005–1.030)
pH: 6 (ref 5.0–8.0)

## 2016-12-24 LAB — CBC WITH DIFFERENTIAL/PLATELET
BASOS ABS: 0 10*3/uL (ref 0–0.1)
Basophils Relative: 0 %
EOS PCT: 2 %
Eosinophils Absolute: 0.1 10*3/uL (ref 0–0.7)
HEMATOCRIT: 37.9 % (ref 35.0–47.0)
HEMOGLOBIN: 13 g/dL (ref 12.0–16.0)
LYMPHS ABS: 3.8 10*3/uL — AB (ref 1.0–3.6)
LYMPHS PCT: 56 %
MCH: 33.5 pg (ref 26.0–34.0)
MCHC: 34.4 g/dL (ref 32.0–36.0)
MCV: 97.3 fL (ref 80.0–100.0)
Monocytes Absolute: 1 10*3/uL — ABNORMAL HIGH (ref 0.2–0.9)
Monocytes Relative: 14 %
NEUTROS ABS: 1.9 10*3/uL (ref 1.4–6.5)
NEUTROS PCT: 28 %
PLATELETS: 254 10*3/uL (ref 150–440)
RBC: 3.89 MIL/uL (ref 3.80–5.20)
RDW: 16.2 % — ABNORMAL HIGH (ref 11.5–14.5)
WBC: 6.9 10*3/uL (ref 3.6–11.0)

## 2016-12-24 LAB — COMPREHENSIVE METABOLIC PANEL
ALT: 15 U/L (ref 14–54)
AST: 21 U/L (ref 15–41)
Albumin: 3.5 g/dL (ref 3.5–5.0)
Alkaline Phosphatase: 68 U/L (ref 38–126)
Anion gap: 6 (ref 5–15)
BUN: 14 mg/dL (ref 6–20)
CALCIUM: 9.3 mg/dL (ref 8.9–10.3)
CHLORIDE: 104 mmol/L (ref 101–111)
CO2: 26 mmol/L (ref 22–32)
CREATININE: 0.99 mg/dL (ref 0.44–1.00)
GFR, EST NON AFRICAN AMERICAN: 55 mL/min — AB (ref >60)
Glucose, Bld: 71 mg/dL (ref 65–99)
Potassium: 4.5 mmol/L (ref 3.5–5.1)
Sodium: 136 mmol/L (ref 135–145)
Total Bilirubin: 0.5 mg/dL (ref 0.3–1.2)
Total Protein: 6.9 g/dL (ref 6.5–8.1)

## 2016-12-24 MED ORDER — PACLITAXEL CHEMO INJECTION 300 MG/50ML
60.0000 mg/m2 | Freq: Once | INTRAVENOUS | Status: AC
  Administered 2016-12-24: 114 mg via INTRAVENOUS
  Filled 2016-12-24: qty 19

## 2016-12-24 MED ORDER — LORAZEPAM 0.5 MG PO TABS: 0.2500 mg | ORAL_TABLET | Freq: Every day | ORAL | 4 refills | Status: DC

## 2016-12-24 MED ORDER — DIPHENHYDRAMINE HCL 50 MG/ML IJ SOLN
25.0000 mg | Freq: Once | INTRAMUSCULAR | Status: AC
  Administered 2016-12-24: 25 mg via INTRAVENOUS
  Filled 2016-12-24: qty 1

## 2016-12-24 MED ORDER — SODIUM CHLORIDE 0.9 % IV SOLN
800.0000 mg | Freq: Once | INTRAVENOUS | Status: AC
  Administered 2016-12-24: 800 mg via INTRAVENOUS
  Filled 2016-12-24: qty 32

## 2016-12-24 MED ORDER — FAMOTIDINE IN NACL 20-0.9 MG/50ML-% IV SOLN
20.0000 mg | Freq: Once | INTRAVENOUS | Status: AC
  Administered 2016-12-24: 20 mg via INTRAVENOUS
  Filled 2016-12-24: qty 50

## 2016-12-24 MED ORDER — SODIUM CHLORIDE 0.9 % IV SOLN
Freq: Once | INTRAVENOUS | Status: AC
  Administered 2016-12-24: 12:00:00 via INTRAVENOUS
  Filled 2016-12-24: qty 1000

## 2016-12-24 MED ORDER — SODIUM CHLORIDE 0.9% FLUSH
10.0000 mL | INTRAVENOUS | Status: DC | PRN
  Administered 2016-12-24: 10 mL
  Filled 2016-12-24: qty 10

## 2016-12-24 MED ORDER — HEPARIN SOD (PORK) LOCK FLUSH 100 UNIT/ML IV SOLN
500.0000 [IU] | Freq: Once | INTRAVENOUS | Status: AC | PRN
  Administered 2016-12-24: 500 [IU]
  Filled 2016-12-24: qty 5

## 2016-12-24 MED ORDER — SODIUM CHLORIDE 0.9 % IV SOLN
20.0000 mg | Freq: Once | INTRAVENOUS | Status: AC
  Administered 2016-12-24: 20 mg via INTRAVENOUS
  Filled 2016-12-24: qty 2

## 2016-12-24 NOTE — Assessment & Plan Note (Addendum)
Recurrent  Platinum refractory ovarian cancer [dec 2017] slight increase in the CA-125.May 4th CT- improved/ resolved- pelvic peritoneal nodularity; no new nodularity noted. Ca-125 improving; exam [with Dr.Secord Improved];   # Currently On Taxol- Avastin [day1,8 & 15 every 28 days [off-day 22]; Avastin every 2 weeks- tolerating well; Proceed with cycle #4; day-1 Labs today reviewed;  acceptable for treatment today. Likely plan a total of 6 cycles of Taxol Avastin; and then continue Avastin maintenance.  # Mucositis- G-2; salt/baking soda rinses; recommend abrevia; lysine improved; continue the same.    # follow up in 2 weeks/ labs-UA; ca 125/ Taxol-Avastin; 4 week covering provider; July 2nd- me/chemo.    # I reviewed the blood work- with the patient in detail; also reviewed the imaging independently [as summarized above]; and with the patient in detail.

## 2016-12-24 NOTE — Progress Notes (Signed)
[place] OFFICE PROGRESS NOTE  Patient Care Team: Jerrol Banana., MD as PCP - General (Family Medicine) Gillis Ends, MD as Referring Physician (Obstetrics and Gynecology) Bary Castilla Forest Gleason, MD as Consulting Physician (General Surgery)  Ovarian cancer [facility])   Staging form: Ovary, AJCC 7th Edition     Clinical: Stage IIIC (T3c, N0, M0) - Unsigned    Oncology History   # April- MAY 2016- OVARIAN CANCER STAGE IIIC; CA-125 +2300+;  s/p OPTIMAL DEBULKING SURGERY   # MAY 2016[name] [finished in Sep 19th 2017]  # MAY CT 2017- RECURRENT/Peritoneal carcinomatosis/pelvic implant ~1.2cm/Ca-125-34;   # July 3rd- CARBO-DOXIL q 4W [with neulasta]; CT May 11 2016- slight increase peritoneal / stable nodules. Cont chemo [finished DEC 2017]. DEC 28th 2017 CT- Increase in peritoneal deposits; increasing Ca 125 [60s]  # G-1-2 hand foot syndrome-   # LEFT BREAST CA s/p Lumpec & RT s/p TAM   # Afib [cardiology]; JUNE 2017- MUGA 62%  MOLECULAR TESTING- ATM genetic mutation; TUMOR BRCA- NEG; Myriad genomic instability- NEGATIVE     Ovarian cancer, unspecified laterality ([place])   12/16/2014 Initial Diagnosis    Ovarian cancer       INTERVAL HISTORY:  76 year old female patient with above history of ovarian cancer recurrent Platinum Refractory ovarian cancer [dec 2017] Patient is currently on Avastin and Taxol. Currently status post cycle #3 day-15 Approximately 2 weeks ago/ review the results of the restaging CAT scan.  Patient noted to have significant improvement of the source in the mouth after starting lysine. She also did not get Taxol at last visit.  Otherwise denies any significant nausea vomiting. Denies any significant tingling or numbness.  Denies any fatigue. Denies any abdominal pain. Denies any abdominal distention. No chest pain or shortness of breath or cough.No headaches.   REVIEW OF SYSTEMS:  A complete 10 point review of system is  done which is negative except mentioned above/history of present illness.   PAST MEDICAL HISTORY :  Past Medical History:  Diagnosis Date  . Atrial fibrillation ([place])   . Cancer of breast ([place]) 1998   Left  . CINV (chemotherapy-induced nausea and vomiting)   . GERD (gastroesophageal reflux disease)   . Hyperlipidemia   . Hypothyroidism   . Mitral insufficiency   . Ovarian cancer [facility])    s/p BSO optimal tumor debulking May 2016  . PVC (premature ventricular contraction)     PAST SURGICAL HISTORY :   Past Surgical History:  Procedure Laterality Date  . ABDOMINAL HYSTERECTOMY    . BILATERAL SALPINGOOPHORECTOMY  May 2016   with optimal tumor debulking   . BREAST SURGERY     Left  . CESAREAN SECTION     x2  . CHOLECYSTECTOMY    . COLONOSCOPY  2006  . DEBULKING N/A 12/22/2014   Procedure: DEBULKING;  Surgeon: Gillis Ends, MD;  Location: [facility] ORS;  Service: Gynecology;  Laterality: N/A;  . LAPAROTOMY N/A 12/22/2014   Procedure: EXPLORATORY LAPAROTOMY;  Surgeon: Robert Bellow, MD;  Location: [facility] ORS;  Service: General;  Laterality: N/A;  . LAPAROTOMY WITH STAGING N/A 12/22/2014   Procedure: LAPAROTOMY WITH STAGING;  Surgeon: Gillis Ends, MD;  Location: [facility] ORS;  Service: Gynecology;  Laterality: N/A;  . PERIPHERAL VASCULAR CATHETERIZATION Left 12/22/2014   Procedure: PORTA CATH INSERTION;  Surgeon: Robert Bellow, MD;  Location: [facility] ORS;  Service: General;  Laterality: Left;  . PORTACATH PLACEMENT Right 12/22/2014   Procedure:  INSERTION PORT-A-CATH;  Surgeon: Robert Bellow, MD;  Location: [facility] ORS;  Service: General;  Laterality: Right;  . SALPINGOOPHORECTOMY Bilateral 12/22/2014   Procedure: SALPINGO OOPHORECTOMY;  Surgeon: Gillis Ends, MD;  Location: [facility] ORS;  Service: Gynecology;  Laterality: Bilateral;  . UPPER GI ENDOSCOPY  09/25/04   hiatus hernia, schatzki ring and a single gastric polyp    FAMILY HISTORY :   Family History  Problem  Relation Age of Onset  . Cancer Mother     breast, throat, and stomach  . Breast cancer Mother   . Heart disease Father   . Pulmonary embolism Sister   . Cancer Brother 60    angiosarcoma of the chest; carcinoid small intestinal tumor  . Hypertension Brother   . Cancer Maternal Aunt     breast cancer  . Cancer Maternal Grandfather 94    pancreatic    SOCIAL HISTORY:   Social History  Substance Use Topics  . Smoking status: Never Smoker  . Smokeless tobacco: Never Used  . Alcohol use No    ALLERGIES:  is allergic to carafate [sucralfate] and sulfa antibiotics.  MEDICATIONS:  Current Outpatient Prescriptions  Medication Sig Dispense Refill  . acetaminophen (TYLENOL) 500 MG chewable tablet Chew 500 mg by mouth every 6 (six) hours as needed for pain.    . Cholecalciferol (VITAMIN D) 2000 units tablet Take 1 tablet (2,000 Units total) by mouth daily. 30 tablet 12  . Docosanol (ABREVA EX) Apply 1 application topically as needed. Fever blisters    . levothyroxine (SYNTHROID, LEVOTHROID) 50 MCG tablet Take 1 tablet (50 mcg total) by mouth daily. 30 tablet 12  . lidocaine-prilocaine (EMLA) cream Apply to port area 30-45 mins prior to chemo. 30 g 5  . Lysine 500 MG TABS Take 1 tablet by mouth daily.    . Multiple Vitamin (MULTIVITAMIN) capsule Take 1 capsule by mouth daily.    . pantoprazole (PROTONIX) 40 MG tablet Take 1 tablet (40 mg total) by mouth daily. 30 tablet 12  . polyethylene glycol powder (GLYCOLAX/MIRALAX) powder TAKE 17GM (LINE INSIDE CAP) IN EIGHT OUNCES OF WATER OR OTHER LIQUID DAILY 255 g 1  . propafenone (RYTHMOL) 225 MG tablet Take 225 mg by mouth 2 (two) times daily.    Marland Kitchen lactulose (CHRONULAC) 10 GM/15ML solution TAKE 15MLS (10G TOTAL) BY MOUTH THREE TIMES A DAY AS DIRECTED BY PHYSICIAN. (Patient not taking: Reported on 12/24/2016) 240 mL 1  . LORazepam (ATIVAN) 0.5 MG tablet Take 0.5 tablets (0.25 mg total) by mouth at bedtime. 30 tablet 4  . ondansetron (ZOFRAN) 4 MG  tablet Take 1 tablet (4 mg total) by mouth every 8 (eight) hours as needed for nausea or vomiting. (Patient not taking: Reported on 12/24/2016) 30 tablet 3  . prochlorperazine (COMPAZINE) 10 MG tablet Take 1 tablet (10 mg total) by mouth every 6 (six) hours as needed for nausea or vomiting. (Patient not taking: Reported on 12/10/2016) 30 tablet 0  . traMADol (ULTRAM) 50 MG tablet Take 1 tablet (50 mg total) by mouth every 6 (six) hours as needed. (Patient not taking: Reported on 12/10/2016) 30 tablet 0   No current facility-administered medications for this visit.    Facility-Administered Medications Ordered in Other Visits  Medication Dose Route Frequency Provider Last Rate Last Dose  . bevacizumab (AVASTIN) 800 mg in sodium chloride 0.9 % 100 mL chemo infusion  800 mg Intravenous Once Charlaine Dalton R, MD      . dexamethasone (DECADRON) 20 mg  in sodium chloride 0.9 % 50 mL IVPB  20 mg Intravenous Once Charlaine Dalton R, MD   20 mg at 12/24/16 1259  . heparin lock flush 100 unit/mL  500 Units Intracatheter Once PRN Cammie Sickle, MD      . PACLitaxel (TAXOL) 114 mg in dextrose 5 % 250 mL chemo infusion (</= '80mg'$ /m2)  60 mg/m2 (Treatment Plan Recorded) Intravenous Once Charlaine Dalton R, MD      . sodium chloride 0.9 % injection 10 mL  10 mL Intracatheter PRN Forest Gleason, MD   10 mL at 02/07/15 0930  . sodium chloride flush (NS) 0.9 % injection 10 mL  10 mL Intracatheter PRN Cammie Sickle, MD   10 mL at 12/24/16 1132    PHYSICAL EXAMINATION: ECOG PERFORMANCE STATUS: 0 - Asymptomatic  BP 126/81   Pulse 74   Temp 97.6 F (36.4 C) (Tympanic)   Resp 18   Ht 5' 3.5" (1.613 m)   Wt 188 lb 14.4 oz (85.7 kg)   BMI 32.94 kg/m   Filed Weights   12/24/16 1136  Weight: 188 lb 14.4 oz (85.7 kg)    GENERAL: Well-nourished well-developed; Alert, no distress and comfortAccompanied by her husband. EYES: no pallor or icterus OROPHARYNX: no thrush or ulceration; good  dentition  NECK: supple, no masses felt LYMPH:  no palpable lymphadenopathy in the cervical, axillary or inguinal regions LUNGS: clear to auscultation and  No wheeze or crackles HEART/CVS: regular rate & rhythm and no murmurs; No lower extremity edema ABDOMEN:abdomen soft, non-tender and normal bowel sounds Musculoskeletal:no cyanosis of digits and no clubbing  PSYCH: alert & oriented x 3 with fluent speech NEURO: no focal motor/sensory deficits SKIN:  Normal. No rash noted.   LABORATORY DATA:  I have reviewed the data as listed    Component Value Date/Time   NA 136 12/24/2016 1112   NA 140 12/14/2014 1037   K 4.5 12/24/2016 1112   K 4.4 12/14/2014 1037   CL 104 12/24/2016 1112   CL 106 12/14/2014 1037   CO2 26 12/24/2016 1112   CO2 26 12/14/2014 1037   GLUCOSE 71 12/24/2016 1112   GLUCOSE 87 12/14/2014 1037   BUN 14 12/24/2016 1112   BUN 12 12/14/2014 1037   CREATININE 0.99 12/24/2016 1112   CREATININE 0.97 12/14/2014 1037   CALCIUM 9.3 12/24/2016 1112   CALCIUM 9.2 12/14/2014 1037   PROT 6.9 12/24/2016 1112   PROT 7.3 12/14/2014 1037   ALBUMIN 3.5 12/24/2016 1112   ALBUMIN 3.8 12/14/2014 1037   AST 21 12/24/2016 1112   AST 20 12/14/2014 1037   ALT 15 12/24/2016 1112   ALT 11 (L) 12/14/2014 1037   ALKPHOS 68 12/24/2016 1112   ALKPHOS 138 (H) 12/14/2014 1037   BILITOT 0.5 12/24/2016 1112   BILITOT 0.5 12/14/2014 1037   GFRNONAA 55 (L) 12/24/2016 1112   GFRNONAA 59 (L) 12/14/2014 1037   GFRAA >60 12/24/2016 1112   GFRAA >60 12/14/2014 1037    No results found for: SPEP, UPEP  Lab Results  Component Value Date   WBC 6.9 12/24/2016   NEUTROABS 1.9 12/24/2016   HGB 13.0 12/24/2016   HCT 37.9 12/24/2016   MCV 97.3 12/24/2016   PLT 254 12/24/2016      Chemistry      Component Value Date/Time   NA 136 12/24/2016 1112   NA 140 12/14/2014 1037   K 4.5 12/24/2016 1112   K 4.4 12/14/2014 1037   CL 104 12/24/2016  1112   CL 106 12/14/2014 1037   CO2 26  12/24/2016 1112   CO2 26 12/14/2014 1037   BUN 14 12/24/2016 1112   BUN 12 12/14/2014 1037   CREATININE 0.99 12/24/2016 1112   CREATININE 0.97 12/14/2014 1037   GLU 84 03/16/2014      Component Value Date/Time   CALCIUM 9.3 12/24/2016 1112   CALCIUM 9.2 12/14/2014 1037   ALKPHOS 68 12/24/2016 1112   ALKPHOS 138 (H) 12/14/2014 1037   AST 21 12/24/2016 1112   AST 20 12/14/2014 1037   ALT 15 12/24/2016 1112   ALT 11 (L) 12/14/2014 1037   BILITOT 0.5 12/24/2016 1112   BILITOT 0.5 12/14/2014 1037     IMPRESSION: 1. Today's study demonstrates a positive response to therapy, as evidenced by regression of the previously noted peritoneal implant in the low anatomic pelvis. No new sites of metastatic disease are noted elsewhere in the abdomen or pelvis. 2. Stable appearance of lesions in the liver which are most compatible with cavernous hemangiomas. 3. Colonic diverticulosis without evidence of acute diverticulitis. 4. Aortic atherosclerosis. 5. Small hiatal hernia.  Electronically Signed   By: Vinnie Langton M.D.   On: 12/21/2016 14:06   RADIOGRAPHIC STUDIES: I have personally reviewed the radiological images as listed and agreed with the findings in the report. No results found.   ASSESSMENT & PLAN:  Ovarian cancer, unspecified laterality ([place]) Recurrent  Platinum refractory ovarian cancer [dec 2017] slight increase in the CA-125.May 4th CT- improved/ resolved- pelvic peritoneal nodularity; no new nodularity noted. Ca-125 improving; exam [with Dr.Secord Improved];   # Currently On Taxol- Avastin [day1,8 & 15 every 28 days [off-day 22]; Avastin every 2 weeks- tolerating well; Proceed with cycle #4; day-1 Labs today reviewed;  acceptable for treatment today. Likely plan a total of 6 cycles of Taxol Avastin; and then continue Avastin maintenance.  # Mucositis- G-2; salt/baking soda rinses; recommend abrevia; lysine improved; continue the same.    # follow up in 2 weeks/  labs-UA; ca 125/ Taxol-Avastin; 4 week covering provider; July 2nd- me/chemo.    # I reviewed the blood work- with the patient in detail; also reviewed the imaging independently [as summarized above]; and with the patient in detail.   Orders Placed This Encounter  Procedures  . CBC with Differential/Platelet    Standing Status:   Standing    Number of Occurrences:   5    Standing Expiration Date:   12/24/2017  . Comprehensive metabolic panel    Standing Status:   Standing    Number of Occurrences:   5    Standing Expiration Date:   12/24/2017  . CA 125    Standing Status:   Future    Standing Expiration Date:   12/24/2017  . Basic metabolic panel    Standing Status:   Future    Standing Expiration Date:   12/24/2017  . CBC with Differential/Platelet    Standing Status:   Future    Standing Expiration Date:   12/24/2017   All questions were answered. The patient knows to call the clinic with any problems, questions or concerns.      Cammie Sickle, MD 12/24/2016 1:10 PM

## 2016-12-31 ENCOUNTER — Inpatient Hospital Stay: Payer: PPO

## 2016-12-31 VITALS — BP 126/78 | HR 82 | Temp 96.8°F | Resp 18

## 2016-12-31 DIAGNOSIS — C569 Malignant neoplasm of unspecified ovary: Secondary | ICD-10-CM

## 2016-12-31 DIAGNOSIS — Z5111 Encounter for antineoplastic chemotherapy: Secondary | ICD-10-CM | POA: Diagnosis not present

## 2016-12-31 LAB — CBC WITH DIFFERENTIAL/PLATELET
BASOS PCT: 1 %
Basophils Absolute: 0.1 10*3/uL (ref 0–0.1)
EOS ABS: 0.2 10*3/uL (ref 0–0.7)
Eosinophils Relative: 4 %
HEMATOCRIT: 36.7 % (ref 35.0–47.0)
HEMOGLOBIN: 12.6 g/dL (ref 12.0–16.0)
LYMPHS ABS: 2.5 10*3/uL (ref 1.0–3.6)
Lymphocytes Relative: 46 %
MCH: 33.6 pg (ref 26.0–34.0)
MCHC: 34.5 g/dL (ref 32.0–36.0)
MCV: 97.5 fL (ref 80.0–100.0)
MONO ABS: 0.4 10*3/uL (ref 0.2–0.9)
MONOS PCT: 8 %
NEUTROS ABS: 2.2 10*3/uL (ref 1.4–6.5)
Neutrophils Relative %: 41 %
Platelets: 225 10*3/uL (ref 150–440)
RBC: 3.76 MIL/uL — ABNORMAL LOW (ref 3.80–5.20)
RDW: 15.9 % — AB (ref 11.5–14.5)
WBC: 5.3 10*3/uL (ref 3.6–11.0)

## 2016-12-31 LAB — BASIC METABOLIC PANEL
Anion gap: 7 (ref 5–15)
BUN: 14 mg/dL (ref 6–20)
CALCIUM: 9.3 mg/dL (ref 8.9–10.3)
CO2: 24 mmol/L (ref 22–32)
CREATININE: 0.91 mg/dL (ref 0.44–1.00)
Chloride: 108 mmol/L (ref 101–111)
GFR calc non Af Amer: >60 mL/min (ref >60)
Glucose, Bld: 103 mg/dL — ABNORMAL HIGH (ref 65–99)
Potassium: 4 mmol/L (ref 3.5–5.1)
Sodium: 139 mmol/L (ref 135–145)

## 2016-12-31 MED ORDER — HEPARIN SOD (PORK) LOCK FLUSH 100 UNIT/ML IV SOLN
500.0000 [IU] | Freq: Once | INTRAVENOUS | Status: AC | PRN
  Administered 2016-12-31: 500 [IU]

## 2016-12-31 MED ORDER — SODIUM CHLORIDE 0.9% FLUSH
10.0000 mL | INTRAVENOUS | Status: DC | PRN
  Administered 2016-12-31: 10 mL
  Filled 2016-12-31: qty 10

## 2016-12-31 MED ORDER — PACLITAXEL CHEMO INJECTION 300 MG/50ML
60.0000 mg/m2 | Freq: Once | INTRAVENOUS | Status: AC
  Administered 2016-12-31: 114 mg via INTRAVENOUS
  Filled 2016-12-31: qty 19

## 2016-12-31 MED ORDER — SODIUM CHLORIDE 0.9 % IV SOLN
Freq: Once | INTRAVENOUS | Status: AC
  Administered 2016-12-31: 09:00:00 via INTRAVENOUS
  Filled 2016-12-31: qty 1000

## 2016-12-31 MED ORDER — HEPARIN SOD (PORK) LOCK FLUSH 100 UNIT/ML IV SOLN
500.0000 [IU] | Freq: Once | INTRAVENOUS | Status: DC
  Filled 2016-12-31: qty 5

## 2016-12-31 MED ORDER — DIPHENHYDRAMINE HCL 50 MG/ML IJ SOLN
25.0000 mg | Freq: Once | INTRAMUSCULAR | Status: AC
  Administered 2016-12-31: 25 mg via INTRAVENOUS
  Filled 2016-12-31: qty 1

## 2016-12-31 MED ORDER — SODIUM CHLORIDE 0.9% FLUSH
10.0000 mL | INTRAVENOUS | Status: DC | PRN
  Administered 2016-12-31: 10 mL via INTRAVENOUS
  Filled 2016-12-31: qty 10

## 2016-12-31 MED ORDER — SODIUM CHLORIDE 0.9 % IV SOLN
20.0000 mg | Freq: Once | INTRAVENOUS | Status: AC
  Administered 2016-12-31: 20 mg via INTRAVENOUS
  Filled 2016-12-31: qty 2

## 2016-12-31 MED ORDER — FAMOTIDINE IN NACL 20-0.9 MG/50ML-% IV SOLN
20.0000 mg | Freq: Once | INTRAVENOUS | Status: AC
  Administered 2016-12-31: 20 mg via INTRAVENOUS
  Filled 2016-12-31: qty 50

## 2017-01-07 ENCOUNTER — Inpatient Hospital Stay: Payer: PPO

## 2017-01-07 ENCOUNTER — Other Ambulatory Visit: Payer: Self-pay | Admitting: Internal Medicine

## 2017-01-07 VITALS — BP 119/76 | HR 84 | Temp 96.7°F | Resp 18

## 2017-01-07 DIAGNOSIS — C569 Malignant neoplasm of unspecified ovary: Secondary | ICD-10-CM

## 2017-01-07 DIAGNOSIS — Z5111 Encounter for antineoplastic chemotherapy: Secondary | ICD-10-CM | POA: Diagnosis not present

## 2017-01-07 LAB — CBC WITH DIFFERENTIAL/PLATELET
Basophils Absolute: 0 10*3/uL (ref 0–0.1)
Basophils Relative: 1 %
EOS PCT: 2 %
Eosinophils Absolute: 0.1 10*3/uL (ref 0–0.7)
HEMATOCRIT: 35.6 % (ref 35.0–47.0)
HEMOGLOBIN: 12.4 g/dL (ref 12.0–16.0)
LYMPHS ABS: 2.3 10*3/uL (ref 1.0–3.6)
LYMPHS PCT: 58 %
MCH: 33.9 pg (ref 26.0–34.0)
MCHC: 35 g/dL (ref 32.0–36.0)
MCV: 97.1 fL (ref 80.0–100.0)
Monocytes Absolute: 0.3 10*3/uL (ref 0.2–0.9)
Monocytes Relative: 8 %
NEUTROS ABS: 1.2 10*3/uL — AB (ref 1.4–6.5)
NEUTROS PCT: 31 %
Platelets: 265 10*3/uL (ref 150–440)
RBC: 3.66 MIL/uL — AB (ref 3.80–5.20)
RDW: 15.8 % — ABNORMAL HIGH (ref 11.5–14.5)
WBC: 4 10*3/uL (ref 3.6–11.0)

## 2017-01-07 LAB — URINALYSIS, COMPLETE (UACMP) WITH MICROSCOPIC
BACTERIA UA: NONE SEEN
Bilirubin Urine: NEGATIVE
Glucose, UA: NEGATIVE mg/dL
Hgb urine dipstick: NEGATIVE
KETONES UR: NEGATIVE mg/dL
LEUKOCYTES UA: NEGATIVE
Nitrite: NEGATIVE
Protein, ur: NEGATIVE mg/dL
RBC / HPF: NONE SEEN RBC/hpf (ref 0–5)
SPECIFIC GRAVITY, URINE: 1.003 — AB (ref 1.005–1.030)
SQUAMOUS EPITHELIAL / LPF: NONE SEEN
WBC, UA: NONE SEEN WBC/hpf (ref 0–5)
pH: 5 (ref 5.0–8.0)

## 2017-01-07 LAB — COMPREHENSIVE METABOLIC PANEL
ALK PHOS: 58 U/L (ref 38–126)
ALT: 15 U/L (ref 14–54)
AST: 21 U/L (ref 15–41)
Albumin: 3.6 g/dL (ref 3.5–5.0)
Anion gap: 6 (ref 5–15)
BUN: 17 mg/dL (ref 6–20)
CALCIUM: 9.3 mg/dL (ref 8.9–10.3)
CHLORIDE: 106 mmol/L (ref 101–111)
CO2: 25 mmol/L (ref 22–32)
CREATININE: 0.92 mg/dL (ref 0.44–1.00)
GFR calc Af Amer: >60 mL/min (ref >60)
Glucose, Bld: 107 mg/dL — ABNORMAL HIGH (ref 65–99)
Potassium: 4 mmol/L (ref 3.5–5.1)
Sodium: 137 mmol/L (ref 135–145)
Total Bilirubin: 0.5 mg/dL (ref 0.3–1.2)
Total Protein: 6.6 g/dL (ref 6.5–8.1)

## 2017-01-07 MED ORDER — PACLITAXEL CHEMO INJECTION 300 MG/50ML
60.0000 mg/m2 | Freq: Once | INTRAVENOUS | Status: AC
  Administered 2017-01-07: 114 mg via INTRAVENOUS
  Filled 2017-01-07: qty 19

## 2017-01-07 MED ORDER — HEPARIN SOD (PORK) LOCK FLUSH 100 UNIT/ML IV SOLN
500.0000 [IU] | Freq: Once | INTRAVENOUS | Status: AC
Start: 2017-01-07 — End: 2017-01-07
  Administered 2017-01-07: 500 [IU] via INTRAVENOUS
  Filled 2017-01-07: qty 5

## 2017-01-07 MED ORDER — DIPHENHYDRAMINE HCL 50 MG/ML IJ SOLN
25.0000 mg | Freq: Once | INTRAMUSCULAR | Status: AC
  Administered 2017-01-07: 25 mg via INTRAVENOUS
  Filled 2017-01-07: qty 1

## 2017-01-07 MED ORDER — DEXAMETHASONE SODIUM PHOSPHATE 100 MG/10ML IJ SOLN
20.0000 mg | Freq: Once | INTRAMUSCULAR | Status: AC
  Administered 2017-01-07: 20 mg via INTRAVENOUS
  Filled 2017-01-07: qty 2

## 2017-01-07 MED ORDER — SODIUM CHLORIDE 0.9 % IV SOLN
800.0000 mg | Freq: Once | INTRAVENOUS | Status: AC
  Administered 2017-01-07: 800 mg via INTRAVENOUS
  Filled 2017-01-07: qty 32

## 2017-01-07 MED ORDER — SODIUM CHLORIDE 0.9 % IV SOLN
Freq: Once | INTRAVENOUS | Status: AC
  Administered 2017-01-07: 09:00:00 via INTRAVENOUS
  Filled 2017-01-07: qty 1000

## 2017-01-07 MED ORDER — SODIUM CHLORIDE 0.9% FLUSH
10.0000 mL | INTRAVENOUS | Status: DC | PRN
  Administered 2017-01-07: 10 mL via INTRAVENOUS
  Filled 2017-01-07: qty 10

## 2017-01-07 MED ORDER — FAMOTIDINE IN NACL 20-0.9 MG/50ML-% IV SOLN
20.0000 mg | Freq: Once | INTRAVENOUS | Status: AC
  Administered 2017-01-07: 20 mg via INTRAVENOUS
  Filled 2017-01-07: qty 50

## 2017-01-18 ENCOUNTER — Other Ambulatory Visit: Payer: Self-pay | Admitting: *Deleted

## 2017-01-18 DIAGNOSIS — C569 Malignant neoplasm of unspecified ovary: Secondary | ICD-10-CM

## 2017-01-21 ENCOUNTER — Inpatient Hospital Stay: Payer: PPO

## 2017-01-21 ENCOUNTER — Inpatient Hospital Stay: Payer: PPO | Attending: Oncology | Admitting: Oncology

## 2017-01-21 VITALS — BP 142/68 | HR 75 | Temp 97.8°F | Resp 16 | Wt 190.6 lb

## 2017-01-21 DIAGNOSIS — C569 Malignant neoplasm of unspecified ovary: Secondary | ICD-10-CM | POA: Insufficient documentation

## 2017-01-21 DIAGNOSIS — K219 Gastro-esophageal reflux disease without esophagitis: Secondary | ICD-10-CM | POA: Insufficient documentation

## 2017-01-21 DIAGNOSIS — Z5112 Encounter for antineoplastic immunotherapy: Secondary | ICD-10-CM | POA: Diagnosis not present

## 2017-01-21 DIAGNOSIS — Z5111 Encounter for antineoplastic chemotherapy: Secondary | ICD-10-CM | POA: Diagnosis not present

## 2017-01-21 DIAGNOSIS — K1230 Oral mucositis (ulcerative), unspecified: Secondary | ICD-10-CM | POA: Diagnosis not present

## 2017-01-21 DIAGNOSIS — R51 Headache: Secondary | ICD-10-CM | POA: Diagnosis not present

## 2017-01-21 DIAGNOSIS — I340 Nonrheumatic mitral (valve) insufficiency: Secondary | ICD-10-CM | POA: Insufficient documentation

## 2017-01-21 DIAGNOSIS — E785 Hyperlipidemia, unspecified: Secondary | ICD-10-CM | POA: Insufficient documentation

## 2017-01-21 DIAGNOSIS — Z800 Family history of malignant neoplasm of digestive organs: Secondary | ICD-10-CM | POA: Diagnosis not present

## 2017-01-21 DIAGNOSIS — I493 Ventricular premature depolarization: Secondary | ICD-10-CM | POA: Diagnosis not present

## 2017-01-21 DIAGNOSIS — I4891 Unspecified atrial fibrillation: Secondary | ICD-10-CM | POA: Insufficient documentation

## 2017-01-21 DIAGNOSIS — Z803 Family history of malignant neoplasm of breast: Secondary | ICD-10-CM | POA: Diagnosis not present

## 2017-01-21 DIAGNOSIS — Z853 Personal history of malignant neoplasm of breast: Secondary | ICD-10-CM | POA: Diagnosis not present

## 2017-01-21 DIAGNOSIS — E039 Hypothyroidism, unspecified: Secondary | ICD-10-CM | POA: Insufficient documentation

## 2017-01-21 LAB — URINALYSIS, COMPLETE (UACMP) WITH MICROSCOPIC
BACTERIA UA: NONE SEEN
Bilirubin Urine: NEGATIVE
GLUCOSE, UA: NEGATIVE mg/dL
Hgb urine dipstick: NEGATIVE
Ketones, ur: NEGATIVE mg/dL
Leukocytes, UA: NEGATIVE
NITRITE: NEGATIVE
PROTEIN: NEGATIVE mg/dL
Specific Gravity, Urine: 1.01 (ref 1.005–1.030)
pH: 5 (ref 5.0–8.0)

## 2017-01-21 LAB — COMPREHENSIVE METABOLIC PANEL
ALBUMIN: 3.3 g/dL — AB (ref 3.5–5.0)
ALK PHOS: 65 U/L (ref 38–126)
ALT: 16 U/L (ref 14–54)
ANION GAP: 6 (ref 5–15)
AST: 21 U/L (ref 15–41)
BILIRUBIN TOTAL: 0.5 mg/dL (ref 0.3–1.2)
BUN: 12 mg/dL (ref 6–20)
CALCIUM: 9.3 mg/dL (ref 8.9–10.3)
CO2: 26 mmol/L (ref 22–32)
Chloride: 108 mmol/L (ref 101–111)
Creatinine, Ser: 0.96 mg/dL (ref 0.44–1.00)
GFR calc Af Amer: >60 mL/min (ref >60)
GFR, EST NON AFRICAN AMERICAN: 57 mL/min — AB (ref >60)
GLUCOSE: 92 mg/dL (ref 65–99)
Potassium: 4.2 mmol/L (ref 3.5–5.1)
Sodium: 140 mmol/L (ref 135–145)
TOTAL PROTEIN: 6.5 g/dL (ref 6.5–8.1)

## 2017-01-21 LAB — CBC WITH DIFFERENTIAL/PLATELET
BASOS PCT: 1 %
Basophils Absolute: 0 10*3/uL (ref 0–0.1)
Eosinophils Absolute: 0.1 10*3/uL (ref 0–0.7)
Eosinophils Relative: 1 %
HEMATOCRIT: 35.8 % (ref 35.0–47.0)
HEMOGLOBIN: 12.4 g/dL (ref 12.0–16.0)
LYMPHS ABS: 2.5 10*3/uL (ref 1.0–3.6)
LYMPHS PCT: 57 %
MCH: 33.6 pg (ref 26.0–34.0)
MCHC: 34.7 g/dL (ref 32.0–36.0)
MCV: 97.1 fL (ref 80.0–100.0)
MONOS PCT: 17 %
Monocytes Absolute: 0.8 10*3/uL (ref 0.2–0.9)
NEUTROS ABS: 1.1 10*3/uL — AB (ref 1.4–6.5)
NEUTROS PCT: 24 %
Platelets: 269 10*3/uL (ref 150–440)
RBC: 3.69 MIL/uL — ABNORMAL LOW (ref 3.80–5.20)
RDW: 16.6 % — ABNORMAL HIGH (ref 11.5–14.5)
WBC: 4.4 10*3/uL (ref 3.6–11.0)

## 2017-01-21 MED ORDER — PACLITAXEL CHEMO INJECTION 300 MG/50ML
60.0000 mg/m2 | Freq: Once | INTRAVENOUS | Status: AC
  Administered 2017-01-21: 114 mg via INTRAVENOUS
  Filled 2017-01-21: qty 19

## 2017-01-21 MED ORDER — DIPHENHYDRAMINE HCL 50 MG/ML IJ SOLN
25.0000 mg | Freq: Once | INTRAMUSCULAR | Status: AC
  Administered 2017-01-21: 25 mg via INTRAVENOUS
  Filled 2017-01-21: qty 1

## 2017-01-21 MED ORDER — HEPARIN SOD (PORK) LOCK FLUSH 100 UNIT/ML IV SOLN
500.0000 [IU] | Freq: Once | INTRAVENOUS | Status: AC
  Administered 2017-01-21: 500 [IU] via INTRAVENOUS

## 2017-01-21 MED ORDER — SODIUM CHLORIDE 0.9 % IV SOLN
Freq: Once | INTRAVENOUS | Status: AC
  Administered 2017-01-21: 11:00:00 via INTRAVENOUS
  Filled 2017-01-21: qty 1000

## 2017-01-21 MED ORDER — FAMOTIDINE IN NACL 20-0.9 MG/50ML-% IV SOLN
20.0000 mg | Freq: Once | INTRAVENOUS | Status: AC
  Administered 2017-01-21: 20 mg via INTRAVENOUS
  Filled 2017-01-21: qty 50

## 2017-01-21 MED ORDER — HEPARIN SOD (PORK) LOCK FLUSH 100 UNIT/ML IV SOLN
INTRAVENOUS | Status: AC
  Filled 2017-01-21: qty 5

## 2017-01-21 MED ORDER — BEVACIZUMAB CHEMO INJECTION 400 MG/16ML
800.0000 mg | Freq: Once | INTRAVENOUS | Status: AC
  Administered 2017-01-21: 800 mg via INTRAVENOUS
  Filled 2017-01-21: qty 32

## 2017-01-21 MED ORDER — SODIUM CHLORIDE 0.9% FLUSH
10.0000 mL | INTRAVENOUS | Status: DC | PRN
  Administered 2017-01-21: 10 mL via INTRAVENOUS
  Filled 2017-01-21: qty 10

## 2017-01-21 MED ORDER — DEXAMETHASONE SODIUM PHOSPHATE 100 MG/10ML IJ SOLN
20.0000 mg | Freq: Once | INTRAMUSCULAR | Status: AC
  Administered 2017-01-21: 20 mg via INTRAVENOUS
  Filled 2017-01-21: qty 2

## 2017-01-21 NOTE — Progress Notes (Signed)
Patient here today for follow up.  Patient c/o headache after receiving avastin

## 2017-01-21 NOTE — Progress Notes (Signed)
ANC 1.1. Per nurse, MD is ok to proceed with treatment at this time.

## 2017-01-21 NOTE — Progress Notes (Signed)
[place]  [phone] [phone]) [phone]  ID: Jamie Conner OB: 1941/07/06  MR#: [id number]  IRC#:[id number]  Patient Care Team: Jerrol Banana., MD as PCP - General (Family Medicine) Gillis Ends, MD as Referring Physician (Obstetrics and Gynecology) Bary Castilla Forest Gleason, MD as Consulting Physician (General Surgery)  CHIEF COMPLAINT: Ovarian cancer, unspecified laterality [facility])  INTERVAL HISTORY: Patient returns to clinic today for further evaluation and consideration of cycle 5, day 1 of Taxol and Avastin. She currently feels well. She previously complained of headache, but none currently. She has no other neurologic complaints. She has a fair appetite. She denies any recent fevers. She denies any chest pain or shortness of breath. She has no nausea, vomiting, constipation, or diarrhea. She has no urinary complaints. Patient offers no further specific complaints.   REVIEW OF SYSTEMS:   Review of Systems  Constitutional: Negative.  Negative for fever, malaise/fatigue and weight loss.  Respiratory: Negative.  Negative for cough and shortness of breath.   Cardiovascular: Negative.  Negative for chest pain and leg swelling.  Gastrointestinal: Negative.  Negative for abdominal pain.  Genitourinary: Negative.   Musculoskeletal: Negative.   Skin: Negative.   Neurological: Positive for headaches.  Psychiatric/Behavioral: Negative.  The patient is not nervous/anxious.     As per HPI. Otherwise, a complete review of systems is negative.  PAST MEDICAL HISTORY: Past Medical History:  Diagnosis Date  . Atrial fibrillation ([place])   . Cancer of breast ([place]) 1998   Left  . CINV (chemotherapy-induced nausea and vomiting)   . GERD (gastroesophageal reflux disease)   . Hyperlipidemia   . Hypothyroidism   . Mitral insufficiency   . Ovarian cancer [facility])    s/p BSO optimal tumor debulking May 2016  . PVC (premature ventricular contraction)       PAST SURGICAL HISTORY: Past Surgical History:  Procedure Laterality Date  . ABDOMINAL HYSTERECTOMY    . BILATERAL SALPINGOOPHORECTOMY  May 2016   with optimal tumor debulking   . BREAST SURGERY     Left  . CESAREAN SECTION     x2  . CHOLECYSTECTOMY    . COLONOSCOPY  2006  . DEBULKING N/A 12/22/2014   Procedure: DEBULKING;  Surgeon: Gillis Ends, MD;  Location: [facility] ORS;  Service: Gynecology;  Laterality: N/A;  . LAPAROTOMY N/A 12/22/2014   Procedure: EXPLORATORY LAPAROTOMY;  Surgeon: Robert Bellow, MD;  Location: [facility] ORS;  Service: General;  Laterality: N/A;  . LAPAROTOMY WITH STAGING N/A 12/22/2014   Procedure: LAPAROTOMY WITH STAGING;  Surgeon: Gillis Ends, MD;  Location: [facility] ORS;  Service: Gynecology;  Laterality: N/A;  . PERIPHERAL VASCULAR CATHETERIZATION Left 12/22/2014   Procedure: PORTA CATH INSERTION;  Surgeon: Robert Bellow, MD;  Location: [facility] ORS;  Service: General;  Laterality: Left;  . PORTACATH PLACEMENT Right 12/22/2014   Procedure: INSERTION PORT-A-CATH;  Surgeon: Robert Bellow, MD;  Location: [facility] ORS;  Service: General;  Laterality: Right;  . SALPINGOOPHORECTOMY Bilateral 12/22/2014   Procedure: SALPINGO OOPHORECTOMY;  Surgeon: Gillis Ends, MD;  Location: [facility] ORS;  Service: Gynecology;  Laterality: Bilateral;  . UPPER GI ENDOSCOPY  09/25/04   hiatus hernia, schatzki ring and a single gastric polyp    FAMILY HISTORY: Family History  Problem Relation Age of Onset  . Cancer Mother        breast, throat, and stomach  . Breast cancer Mother   . Heart disease Father   . Pulmonary embolism Sister   .  Cancer Brother 60       angiosarcoma of the chest; carcinoid small intestinal tumor  . Hypertension Brother   . Cancer Maternal Aunt        breast cancer  . Cancer Maternal Grandfather 110       pancreatic    ADVANCED DIRECTIVES (Y/N):  N  HEALTH MAINTENANCE: Social History  Substance Use Topics  . Smoking status: Never  Smoker  . Smokeless tobacco: Never Used  . Alcohol use No     Colonoscopy:  PAP:  Bone density:  Lipid panel:  Allergies  Allergen Reactions  . Carafate [Sucralfate] Rash  . Sulfa Antibiotics Rash    Current Outpatient Prescriptions  Medication Sig Dispense Refill  . acetaminophen (TYLENOL) 500 MG chewable tablet Chew 500 mg by mouth every 6 (six) hours as needed for pain.    . Cholecalciferol (VITAMIN D) 2000 units tablet Take 1 tablet (2,000 Units total) by mouth daily. 30 tablet 12  . Docosanol (ABREVA EX) Apply 1 application topically as needed. Fever blisters    . lactulose (CHRONULAC) 10 GM/15ML solution TAKE 15MLS (10G TOTAL) BY MOUTH THREE TIMES A DAY AS DIRECTED BY PHYSICIAN. 240 mL 1  . levothyroxine (SYNTHROID, LEVOTHROID) 50 MCG tablet Take 1 tablet (50 mcg total) by mouth daily. 30 tablet 12  . lidocaine-prilocaine (EMLA) cream Apply to port area 30-45 mins prior to chemo. 30 g 5  . LORazepam (ATIVAN) 0.5 MG tablet Take 0.5 tablets (0.25 mg total) by mouth at bedtime. 30 tablet 4  . Lysine 500 MG TABS Take 1 tablet by mouth daily.    . Multiple Vitamin (MULTIVITAMIN) capsule Take 1 capsule by mouth daily.    . ondansetron (ZOFRAN) 4 MG tablet Take 1 tablet (4 mg total) by mouth every 8 (eight) hours as needed for nausea or vomiting. 30 tablet 3  . pantoprazole (PROTONIX) 40 MG tablet Take 1 tablet (40 mg total) by mouth daily. 30 tablet 12  . polyethylene glycol powder (GLYCOLAX/MIRALAX) powder TAKE 17GM (LINE INSIDE CAP) IN EIGHT OUNCES OF WATER OR OTHER LIQUID DAILY 255 g 1  . prochlorperazine (COMPAZINE) 10 MG tablet Take 1 tablet (10 mg total) by mouth every 6 (six) hours as needed for nausea or vomiting. 30 tablet 0  . propafenone (RYTHMOL) 225 MG tablet Take 225 mg by mouth 2 (two) times daily.    . traMADol (ULTRAM) 50 MG tablet Take 1 tablet (50 mg total) by mouth every 6 (six) hours as needed. 30 tablet 0   No current facility-administered medications for this  visit.    Facility-Administered Medications Ordered in Other Visits  Medication Dose Route Frequency Provider Last Rate Last Dose  . sodium chloride 0.9 % injection 10 mL  10 mL Intracatheter PRN Choksi, Delorise Shiner, MD   10 mL at 02/07/15 0930    OBJECTIVE: Vitals:   01/21/17 0947  BP: (!) 142/68  Pulse: 75  Resp: 16  Temp: 97.8 F (36.6 C)     Body mass index is 33.23 kg/m.    ECOG FS:0 - Asymptomatic  General: Well-developed, well-nourished, no acute distress. Eyes: Pink conjunctiva, anicteric sclera. Lungs: Clear to auscultation bilaterally. Heart: Regular rate and rhythm. No rubs, murmurs, or gallops. Abdomen: Soft, nontender, nondistended. No organomegaly noted, normoactive bowel sounds. Musculoskeletal: No edema, cyanosis, or clubbing. Neuro: Alert, answering all questions appropriately. Cranial nerves grossly intact. Skin: No rashes or petechiae noted. Psych: Normal affect.   LAB RESULTS:  Lab Results  Component Value Date  NA 140 01/21/2017   K 4.2 01/21/2017   CL 108 01/21/2017   CO2 26 01/21/2017   GLUCOSE 92 01/21/2017   BUN 12 01/21/2017   CREATININE 0.96 01/21/2017   CALCIUM 9.3 01/21/2017   PROT 6.5 01/21/2017   ALBUMIN 3.3 (L) 01/21/2017   AST 21 01/21/2017   ALT 16 01/21/2017   ALKPHOS 65 01/21/2017   BILITOT 0.5 01/21/2017   GFRNONAA 57 (L) 01/21/2017   GFRAA >60 01/21/2017    Lab Results  Component Value Date   WBC 4.4 01/21/2017   NEUTROABS 1.1 (L) 01/21/2017   HGB 12.4 01/21/2017   HCT 35.8 01/21/2017   MCV 97.1 01/21/2017   PLT 269 01/21/2017     STUDIES: No results found.  ASSESSMENT & PLAN:   Ovarian cancer, unspecified laterality ([place])  Recurrent  Platinum refractory ovarian cancer [dec 2017] slight increase in the CA-125.May 4th CT- improved/ resolved- pelvic peritoneal nodularity; no new nodularity noted. Ca-125 improving; exam [with Dr.Secord Improved];   # Currently On Taxol- Avastin [day1,8 & 15 every 28 days [off-day  22]; Avastin every 2 weeks- tolerating well; Proceed with Cycle 5, day 1. Return to clinic in 1 week for lab and Taxol only and then in 2 weeks for further evaluation and consideration of cycle 6. Likely plan a total of 6 cycles of Taxol Avastin; and then continue Avastin maintenance.  # Mucositis- G-2; improved. salt/baking soda rinses; recommend abrevia; lysine improved; continue the same.       Patient expressed understanding and was in agreement with this plan. She also understands that She can call clinic at any time with any questions, concerns, or complaints.   Cancer Staging Ovarian cancer, unspecified laterality ([place]) Staging form: Ovary, AJCC 7th Edition - Clinical: Stage IIIC (T3c, N0, M0) - Unsigned   Lloyd Huger, MD   01/26/2017 12:24 PM

## 2017-01-21 NOTE — Progress Notes (Signed)
11:00 - Spoke with Dr. Grayland Ormond regarding the patient receiving her chemo regimen today, after reviewing labs  he said to proceed with treatment today.  Jamie Conner

## 2017-01-22 LAB — CA 125: CA 125: 12.8 U/mL (ref 0.0–38.1)

## 2017-01-24 ENCOUNTER — Telehealth: Payer: Self-pay | Admitting: *Deleted

## 2017-01-24 NOTE — Telephone Encounter (Signed)
Contacted patient- spoke with patient regarding her ca125 results of 12.8.  Pt reassured- results stable. Pt states that today has been difficult for her. She had a severe headache last evening and again this morning -accompanied by nausea. Pt took 2 Tylenol tablets (500 mg) each this morning and headache has improved. Also taking zofran 4 mg as directed. Pt states that she is constipated. She is using miralax and stool softners to improve constipation. RN encouraged po fluid intake increase. Pt gave verbal understanding. She thanked me for calling and checking on her.

## 2017-01-28 ENCOUNTER — Inpatient Hospital Stay: Payer: PPO

## 2017-01-28 VITALS — BP 122/77 | HR 97 | Temp 97.7°F | Resp 18

## 2017-01-28 DIAGNOSIS — C569 Malignant neoplasm of unspecified ovary: Secondary | ICD-10-CM

## 2017-01-28 DIAGNOSIS — Z5111 Encounter for antineoplastic chemotherapy: Secondary | ICD-10-CM | POA: Diagnosis not present

## 2017-01-28 LAB — CBC WITH DIFFERENTIAL/PLATELET
BASOS ABS: 0 10*3/uL (ref 0–0.1)
BASOS PCT: 1 %
Eosinophils Absolute: 0.1 10*3/uL (ref 0–0.7)
Eosinophils Relative: 2 %
HEMATOCRIT: 36.8 % (ref 35.0–47.0)
Hemoglobin: 12.7 g/dL (ref 12.0–16.0)
Lymphocytes Relative: 52 %
Lymphs Abs: 2.7 10*3/uL (ref 1.0–3.6)
MCH: 33.4 pg (ref 26.0–34.0)
MCHC: 34.6 g/dL (ref 32.0–36.0)
MCV: 96.4 fL (ref 80.0–100.0)
MONO ABS: 0.5 10*3/uL (ref 0.2–0.9)
Monocytes Relative: 10 %
NEUTROS ABS: 1.8 10*3/uL (ref 1.4–6.5)
NEUTROS PCT: 35 %
Platelets: 267 10*3/uL (ref 150–440)
RBC: 3.82 MIL/uL (ref 3.80–5.20)
RDW: 16.5 % — AB (ref 11.5–14.5)
WBC: 5.1 10*3/uL (ref 3.6–11.0)

## 2017-01-28 LAB — COMPREHENSIVE METABOLIC PANEL
ALBUMIN: 3.5 g/dL (ref 3.5–5.0)
ALT: 16 U/L (ref 14–54)
AST: 22 U/L (ref 15–41)
Alkaline Phosphatase: 57 U/L (ref 38–126)
Anion gap: 5 (ref 5–15)
BILIRUBIN TOTAL: 0.6 mg/dL (ref 0.3–1.2)
BUN: 15 mg/dL (ref 6–20)
CHLORIDE: 106 mmol/L (ref 101–111)
CO2: 25 mmol/L (ref 22–32)
Calcium: 9.4 mg/dL (ref 8.9–10.3)
Creatinine, Ser: 0.99 mg/dL (ref 0.44–1.00)
GFR calc Af Amer: >60 mL/min (ref >60)
GFR calc non Af Amer: 55 mL/min — ABNORMAL LOW (ref >60)
GLUCOSE: 115 mg/dL — AB (ref 65–99)
Potassium: 4 mmol/L (ref 3.5–5.1)
Sodium: 136 mmol/L (ref 135–145)
Total Protein: 6.5 g/dL (ref 6.5–8.1)

## 2017-01-28 LAB — URINALYSIS, COMPLETE (UACMP) WITH MICROSCOPIC
BACTERIA UA: NONE SEEN
BILIRUBIN URINE: NEGATIVE
Glucose, UA: NEGATIVE mg/dL
Hgb urine dipstick: NEGATIVE
KETONES UR: NEGATIVE mg/dL
Nitrite: NEGATIVE
Protein, ur: NEGATIVE mg/dL
Specific Gravity, Urine: 1.016 (ref 1.005–1.030)
pH: 5 (ref 5.0–8.0)

## 2017-01-28 MED ORDER — DEXAMETHASONE SODIUM PHOSPHATE 100 MG/10ML IJ SOLN
20.0000 mg | Freq: Once | INTRAMUSCULAR | Status: AC
  Administered 2017-01-28: 20 mg via INTRAVENOUS
  Filled 2017-01-28: qty 2

## 2017-01-28 MED ORDER — FAMOTIDINE IN NACL 20-0.9 MG/50ML-% IV SOLN
20.0000 mg | Freq: Once | INTRAVENOUS | Status: AC
  Administered 2017-01-28: 20 mg via INTRAVENOUS
  Filled 2017-01-28: qty 50

## 2017-01-28 MED ORDER — SODIUM CHLORIDE 0.9 % IV SOLN
Freq: Once | INTRAVENOUS | Status: AC
  Administered 2017-01-28: 10:00:00 via INTRAVENOUS
  Filled 2017-01-28: qty 1000

## 2017-01-28 MED ORDER — SODIUM CHLORIDE 0.9 % IV SOLN
60.0000 mg/m2 | Freq: Once | INTRAVENOUS | Status: AC
  Administered 2017-01-28: 114 mg via INTRAVENOUS
  Filled 2017-01-28: qty 19

## 2017-01-28 MED ORDER — HEPARIN SOD (PORK) LOCK FLUSH 100 UNIT/ML IV SOLN
500.0000 [IU] | Freq: Once | INTRAVENOUS | Status: AC | PRN
  Administered 2017-01-28: 500 [IU]
  Filled 2017-01-28: qty 5

## 2017-01-28 MED ORDER — DIPHENHYDRAMINE HCL 50 MG/ML IJ SOLN
25.0000 mg | Freq: Once | INTRAMUSCULAR | Status: AC
  Administered 2017-01-28: 25 mg via INTRAVENOUS
  Filled 2017-01-28: qty 1

## 2017-02-02 NOTE — Progress Notes (Signed)
Jamie Conner  [phone]) [phone] [phone]) [phone]  ID: Jamie Conner OB: [birth year]  MR#: [id number]  IRJ#:[id number]  Patient Care Team: Jerrol Banana., MD as PCP - General (Family Medicine) Gillis Ends, MD as Referring Physician (Obstetrics and Gynecology) Bary Castilla Forest Gleason, MD as Consulting Physician (General Surgery)  CHIEF COMPLAINT: Ovarian cancer, unspecified laterality [facility])  INTERVAL HISTORY: Patient returns to clinic today for further evaluation and consideration of cycle 5, day 15 of Taxol and Avastin. She currently feels well. She has no neurologic complaints. She has a fair appetite. She denies any recent fevers or illnesses. She denies any chest pain or shortness of breath. She has no nausea, vomiting, constipation, or diarrhea. She does not complain of abdominal pain or bloating. She has no urinary complaints. Patient offers no specific complaints today.   REVIEW OF SYSTEMS:   Review of Systems  Constitutional: Negative.  Negative for fever, malaise/fatigue and weight loss.  Respiratory: Negative.  Negative for cough and shortness of breath.   Cardiovascular: Negative.  Negative for chest pain and leg swelling.  Gastrointestinal: Negative.  Negative for abdominal pain.  Genitourinary: Negative.   Musculoskeletal: Negative.   Skin: Negative.  Negative for rash.  Neurological: Negative for headaches.  Psychiatric/Behavioral: Negative.  The patient is not nervous/anxious.     As per HPI. Otherwise, a complete review of systems is negative.  PAST MEDICAL HISTORY: Past Medical History:  Diagnosis Date  . Atrial fibrillation ([place])   . Cancer of breast ([place]) 1998   Left  . CINV (chemotherapy-induced nausea and vomiting)   . GERD (gastroesophageal reflux disease)   . Hyperlipidemia   . Hypothyroidism   . Mitral insufficiency   . Ovarian cancer [facility])    s/p BSO optimal tumor debulking May 2016  . PVC (premature  ventricular contraction)     PAST SURGICAL HISTORY: Past Surgical History:  Procedure Laterality Date  . ABDOMINAL HYSTERECTOMY    . BILATERAL SALPINGOOPHORECTOMY  May 2016   with optimal tumor debulking   . BREAST SURGERY     Left  . CESAREAN SECTION     x2  . CHOLECYSTECTOMY    . COLONOSCOPY  2006  . DEBULKING N/A 12/22/2014   Procedure: DEBULKING;  Surgeon: Gillis Ends, MD;  Location: [facility] ORS;  Service: Gynecology;  Laterality: N/A;  . LAPAROTOMY N/A 12/22/2014   Procedure: EXPLORATORY LAPAROTOMY;  Surgeon: Robert Bellow, MD;  Location: [facility] ORS;  Service: General;  Laterality: N/A;  . LAPAROTOMY WITH STAGING N/A 12/22/2014   Procedure: LAPAROTOMY WITH STAGING;  Surgeon: Gillis Ends, MD;  Location: [facility] ORS;  Service: Gynecology;  Laterality: N/A;  . PERIPHERAL VASCULAR CATHETERIZATION Left 12/22/2014   Procedure: PORTA CATH INSERTION;  Surgeon: Robert Bellow, MD;  Location: [facility] ORS;  Service: General;  Laterality: Left;  . PORTACATH PLACEMENT Right 12/22/2014   Procedure: INSERTION PORT-A-CATH;  Surgeon: Robert Bellow, MD;  Location: [facility] ORS;  Service: General;  Laterality: Right;  . SALPINGOOPHORECTOMY Bilateral 12/22/2014   Procedure: SALPINGO OOPHORECTOMY;  Surgeon: Gillis Ends, MD;  Location: [facility] ORS;  Service: Gynecology;  Laterality: Bilateral;  . UPPER GI ENDOSCOPY  09/25/04   hiatus hernia, schatzki ring and a single gastric polyp    FAMILY HISTORY: Family History  Problem Relation Age of Onset  . Cancer Mother        breast, throat, and stomach  . Breast cancer Mother   . Heart disease Father   . Pulmonary  embolism Sister   . Cancer Brother 60       angiosarcoma of the chest; carcinoid small intestinal tumor  . Hypertension Brother   . Cancer Maternal Aunt        breast cancer  . Cancer Maternal Grandfather 11       pancreatic    ADVANCED DIRECTIVES (Y/N):  N  HEALTH MAINTENANCE: Social History  Substance Use Topics    . Smoking status: Never Smoker  . Smokeless tobacco: Never Used  . Alcohol use No     Colonoscopy:  PAP:  Bone density:  Lipid panel:  Allergies  Allergen Reactions  . Carafate [Sucralfate] Rash  . Sulfa Antibiotics Rash    Current Outpatient Prescriptions  Medication Sig Dispense Refill  . acetaminophen (TYLENOL) 500 MG chewable tablet Chew 500 mg by mouth every 6 (six) hours as needed for pain.    . Cholecalciferol (VITAMIN D) 2000 units tablet Take 1 tablet (2,000 Units total) by mouth daily. 30 tablet 12  . Docosanol (ABREVA EX) Apply 1 application topically as needed. Fever blisters    . lactulose (CHRONULAC) 10 GM/15ML solution TAKE 15MLS (10G TOTAL) BY MOUTH THREE TIMES A DAY AS DIRECTED BY PHYSICIAN. 240 mL 1  . levothyroxine (SYNTHROID, LEVOTHROID) 50 MCG tablet Take 1 tablet (50 mcg total) by mouth daily. 30 tablet 12  . lidocaine-prilocaine (EMLA) cream Apply to port area 30-45 mins prior to chemo. 30 g 5  . LORazepam (ATIVAN) 0.5 MG tablet Take 0.5 tablets (0.25 mg total) by mouth at bedtime. 30 tablet 4  . Lysine 500 MG TABS Take 1 tablet by mouth daily.    . Multiple Vitamin (MULTIVITAMIN) capsule Take 1 capsule by mouth daily.    . ondansetron (ZOFRAN) 4 MG tablet Take 1 tablet (4 mg total) by mouth every 8 (eight) hours as needed for nausea or vomiting. 30 tablet 3  . pantoprazole (PROTONIX) 40 MG tablet Take 1 tablet (40 mg total) by mouth daily. 30 tablet 12  . polyethylene glycol powder (GLYCOLAX/MIRALAX) powder TAKE 17GM (LINE INSIDE CAP) IN EIGHT OUNCES OF WATER OR OTHER LIQUID DAILY 255 g 1  . prochlorperazine (COMPAZINE) 10 MG tablet Take 1 tablet (10 mg total) by mouth every 6 (six) hours as needed for nausea or vomiting. 30 tablet 0  . propafenone (RYTHMOL) 225 MG tablet Take 225 mg by mouth 2 (two) times daily.    . traMADol (ULTRAM) 50 MG tablet Take 1 tablet (50 mg total) by mouth every 6 (six) hours as needed. (Patient not taking: Reported on 02/04/2017)  30 tablet 0   No current facility-administered medications for this visit.    Facility-Administered Medications Ordered in Other Visits  Medication Dose Route Frequency Provider Last Rate Last Dose  . sodium chloride 0.9 % injection 10 mL  10 mL Intracatheter PRN Forest Gleason, MD   10 mL at 02/07/15 0930    OBJECTIVE: Vitals:   02/04/17 0957  BP: 112/77  Pulse: 99  Resp: 16  Temp: 98.2 F (36.8 C)     Body mass index is 33.03 kg/m.    ECOG FS:0 - Asymptomatic  General: Well-developed, well-nourished, no acute distress. Eyes: Pink conjunctiva, anicteric sclera. Lungs: Clear to auscultation bilaterally. Heart: Regular rate and rhythm. No rubs, murmurs, or gallops. Abdomen: Soft, nontender, nondistended. No organomegaly noted, normoactive bowel sounds. Musculoskeletal: No edema, cyanosis, or clubbing. Neuro: Alert, answering all questions appropriately. Cranial nerves grossly intact. Skin: No rashes or petechiae noted. Psych: Normal affect.  LAB RESULTS:  Lab Results  Component Value Date   NA 136 02/04/2017   K 3.9 02/04/2017   CL 103 02/04/2017   CO2 25 02/04/2017   GLUCOSE 110 (H) 02/04/2017   BUN 15 02/04/2017   CREATININE 0.91 02/04/2017   CALCIUM 9.0 02/04/2017   PROT 6.3 (L) 02/04/2017   ALBUMIN 3.3 (L) 02/04/2017   AST 21 02/04/2017   ALT 15 02/04/2017   ALKPHOS 58 02/04/2017   BILITOT 0.6 02/04/2017   GFRNONAA >60 02/04/2017   GFRAA >60 02/04/2017    Lab Results  Component Value Date   WBC 4.3 02/04/2017   NEUTROABS 1.4 02/04/2017   HGB 12.4 02/04/2017   HCT 35.8 02/04/2017   MCV 97.2 02/04/2017   PLT 255 02/04/2017   Lab Results  Component Value Date   CA125 12.8 01/21/2017     STUDIES: No results found.  ASSESSMENT & PLAN:   Ovarian cancer, unspecified laterality ([place])  Recurrent  Platinum refractory ovarian cancer [dec 2017] CA-125 within normal limits. May 4th CT- improved/ resolved- pelvic peritoneal nodularity; no new nodularity  noted. Exam [with Dr.Secord Improved];   # Currently On Taxol- Avastin [day 1 ,8 & 15 every 28 days [off-day 22]; Avastin every 2 weeks- tolerating well; Proceed with cycle 5, day 15 today. Return to clinic in 2 weeks for further evaluation and consideration of cycle 6, day 1. Likely plan a total of 6 cycles then continue Avastin maintenance.  # Mucositis- G-2; resolved. Continue salt/baking soda rinses; recommend abrevia; lysine as needed.    Patient expressed understanding and was in agreement with this plan. She also understands that She can call clinic at any time with any questions, concerns, or complaints.   Cancer Staging Ovarian cancer, unspecified laterality ([place]) Staging form: Ovary, AJCC 7th Edition - Clinical: Stage IIIC (T3c, N0, M0) - Unsigned   Lloyd Huger, MD   02/05/2017 8:27 AM

## 2017-02-04 ENCOUNTER — Inpatient Hospital Stay: Payer: PPO

## 2017-02-04 ENCOUNTER — Inpatient Hospital Stay (HOSPITAL_BASED_OUTPATIENT_CLINIC_OR_DEPARTMENT_OTHER): Payer: PPO | Admitting: Oncology

## 2017-02-04 VITALS — BP 112/77 | HR 99 | Temp 98.2°F | Resp 16 | Wt 189.4 lb

## 2017-02-04 DIAGNOSIS — R51 Headache: Secondary | ICD-10-CM | POA: Diagnosis not present

## 2017-02-04 DIAGNOSIS — I340 Nonrheumatic mitral (valve) insufficiency: Secondary | ICD-10-CM | POA: Diagnosis not present

## 2017-02-04 DIAGNOSIS — I493 Ventricular premature depolarization: Secondary | ICD-10-CM

## 2017-02-04 DIAGNOSIS — Z5111 Encounter for antineoplastic chemotherapy: Secondary | ICD-10-CM | POA: Diagnosis not present

## 2017-02-04 DIAGNOSIS — Z803 Family history of malignant neoplasm of breast: Secondary | ICD-10-CM | POA: Diagnosis not present

## 2017-02-04 DIAGNOSIS — E039 Hypothyroidism, unspecified: Secondary | ICD-10-CM

## 2017-02-04 DIAGNOSIS — C569 Malignant neoplasm of unspecified ovary: Secondary | ICD-10-CM

## 2017-02-04 DIAGNOSIS — K219 Gastro-esophageal reflux disease without esophagitis: Secondary | ICD-10-CM

## 2017-02-04 DIAGNOSIS — Z853 Personal history of malignant neoplasm of breast: Secondary | ICD-10-CM | POA: Diagnosis not present

## 2017-02-04 DIAGNOSIS — E785 Hyperlipidemia, unspecified: Secondary | ICD-10-CM

## 2017-02-04 DIAGNOSIS — Z800 Family history of malignant neoplasm of digestive organs: Secondary | ICD-10-CM | POA: Diagnosis not present

## 2017-02-04 DIAGNOSIS — I4891 Unspecified atrial fibrillation: Secondary | ICD-10-CM

## 2017-02-04 LAB — CBC WITH DIFFERENTIAL/PLATELET
BASOS PCT: 1 %
Basophils Absolute: 0 10*3/uL (ref 0–0.1)
EOS PCT: 3 %
Eosinophils Absolute: 0.1 10*3/uL (ref 0–0.7)
HCT: 35.8 % (ref 35.0–47.0)
HEMOGLOBIN: 12.4 g/dL (ref 12.0–16.0)
Lymphocytes Relative: 55 %
Lymphs Abs: 2.4 10*3/uL (ref 1.0–3.6)
MCH: 33.5 pg (ref 26.0–34.0)
MCHC: 34.5 g/dL (ref 32.0–36.0)
MCV: 97.2 fL (ref 80.0–100.0)
Monocytes Absolute: 0.4 10*3/uL (ref 0.2–0.9)
Monocytes Relative: 10 %
NEUTROS PCT: 33 %
Neutro Abs: 1.4 10*3/uL (ref 1.4–6.5)
PLATELETS: 255 10*3/uL (ref 150–440)
RBC: 3.68 MIL/uL — AB (ref 3.80–5.20)
RDW: 16.9 % — ABNORMAL HIGH (ref 11.5–14.5)
WBC: 4.3 10*3/uL (ref 3.6–11.0)

## 2017-02-04 LAB — COMPREHENSIVE METABOLIC PANEL
ALBUMIN: 3.3 g/dL — AB (ref 3.5–5.0)
ALK PHOS: 58 U/L (ref 38–126)
ALT: 15 U/L (ref 14–54)
ANION GAP: 8 (ref 5–15)
AST: 21 U/L (ref 15–41)
BUN: 15 mg/dL (ref 6–20)
CALCIUM: 9 mg/dL (ref 8.9–10.3)
CHLORIDE: 103 mmol/L (ref 101–111)
CO2: 25 mmol/L (ref 22–32)
CREATININE: 0.91 mg/dL (ref 0.44–1.00)
GFR calc non Af Amer: >60 mL/min (ref >60)
GLUCOSE: 110 mg/dL — AB (ref 65–99)
Potassium: 3.9 mmol/L (ref 3.5–5.1)
SODIUM: 136 mmol/L (ref 135–145)
Total Bilirubin: 0.6 mg/dL (ref 0.3–1.2)
Total Protein: 6.3 g/dL — ABNORMAL LOW (ref 6.5–8.1)

## 2017-02-04 MED ORDER — HEPARIN SOD (PORK) LOCK FLUSH 100 UNIT/ML IV SOLN
500.0000 [IU] | Freq: Once | INTRAVENOUS | Status: AC
  Administered 2017-02-04: 500 [IU] via INTRAVENOUS

## 2017-02-04 MED ORDER — FAMOTIDINE IN NACL 20-0.9 MG/50ML-% IV SOLN
20.0000 mg | Freq: Once | INTRAVENOUS | Status: AC
  Administered 2017-02-04: 20 mg via INTRAVENOUS
  Filled 2017-02-04: qty 50

## 2017-02-04 MED ORDER — SODIUM CHLORIDE 0.9 % IV SOLN
20.0000 mg | Freq: Once | INTRAVENOUS | Status: AC
  Administered 2017-02-04: 20 mg via INTRAVENOUS
  Filled 2017-02-04: qty 2

## 2017-02-04 MED ORDER — SODIUM CHLORIDE 0.9 % IV SOLN
800.0000 mg | Freq: Once | INTRAVENOUS | Status: AC
  Administered 2017-02-04: 800 mg via INTRAVENOUS
  Filled 2017-02-04: qty 32

## 2017-02-04 MED ORDER — HEPARIN SOD (PORK) LOCK FLUSH 100 UNIT/ML IV SOLN
INTRAVENOUS | Status: AC
  Filled 2017-02-04: qty 5

## 2017-02-04 MED ORDER — SODIUM CHLORIDE 0.9 % IV SOLN
Freq: Once | INTRAVENOUS | Status: AC
  Administered 2017-02-04: 11:00:00 via INTRAVENOUS
  Filled 2017-02-04: qty 1000

## 2017-02-04 MED ORDER — SODIUM CHLORIDE 0.9% FLUSH
10.0000 mL | INTRAVENOUS | Status: DC | PRN
  Administered 2017-02-04: 10 mL via INTRAVENOUS
  Filled 2017-02-04: qty 10

## 2017-02-04 MED ORDER — DIPHENHYDRAMINE HCL 50 MG/ML IJ SOLN
25.0000 mg | Freq: Once | INTRAMUSCULAR | Status: AC
  Administered 2017-02-04: 25 mg via INTRAVENOUS
  Filled 2017-02-04: qty 1

## 2017-02-04 MED ORDER — SODIUM CHLORIDE 0.9 % IV SOLN
60.0000 mg/m2 | Freq: Once | INTRAVENOUS | Status: AC
  Administered 2017-02-04: 114 mg via INTRAVENOUS
  Filled 2017-02-04: qty 19

## 2017-02-04 NOTE — Progress Notes (Signed)
Patient here today for follow up.  Patient states no new concerns today  

## 2017-02-08 DIAGNOSIS — I480 Paroxysmal atrial fibrillation: Secondary | ICD-10-CM | POA: Diagnosis not present

## 2017-02-18 ENCOUNTER — Inpatient Hospital Stay (HOSPITAL_BASED_OUTPATIENT_CLINIC_OR_DEPARTMENT_OTHER): Payer: PPO | Admitting: Internal Medicine

## 2017-02-18 ENCOUNTER — Inpatient Hospital Stay: Payer: PPO | Attending: Internal Medicine

## 2017-02-18 ENCOUNTER — Inpatient Hospital Stay: Payer: PPO

## 2017-02-18 VITALS — BP 137/80 | HR 96 | Temp 97.6°F | Resp 20 | Ht 63.5 in | Wt 188.9 lb

## 2017-02-18 DIAGNOSIS — Z79899 Other long term (current) drug therapy: Secondary | ICD-10-CM | POA: Insufficient documentation

## 2017-02-18 DIAGNOSIS — Z5112 Encounter for antineoplastic immunotherapy: Secondary | ICD-10-CM | POA: Insufficient documentation

## 2017-02-18 DIAGNOSIS — I493 Ventricular premature depolarization: Secondary | ICD-10-CM | POA: Insufficient documentation

## 2017-02-18 DIAGNOSIS — E785 Hyperlipidemia, unspecified: Secondary | ICD-10-CM

## 2017-02-18 DIAGNOSIS — Z800 Family history of malignant neoplasm of digestive organs: Secondary | ICD-10-CM | POA: Diagnosis not present

## 2017-02-18 DIAGNOSIS — K449 Diaphragmatic hernia without obstruction or gangrene: Secondary | ICD-10-CM | POA: Insufficient documentation

## 2017-02-18 DIAGNOSIS — Z853 Personal history of malignant neoplasm of breast: Secondary | ICD-10-CM | POA: Insufficient documentation

## 2017-02-18 DIAGNOSIS — I340 Nonrheumatic mitral (valve) insufficiency: Secondary | ICD-10-CM | POA: Diagnosis not present

## 2017-02-18 DIAGNOSIS — I4891 Unspecified atrial fibrillation: Secondary | ICD-10-CM | POA: Diagnosis not present

## 2017-02-18 DIAGNOSIS — I10 Essential (primary) hypertension: Secondary | ICD-10-CM | POA: Insufficient documentation

## 2017-02-18 DIAGNOSIS — I700 Atherosclerosis of aorta: Secondary | ICD-10-CM | POA: Diagnosis not present

## 2017-02-18 DIAGNOSIS — R040 Epistaxis: Secondary | ICD-10-CM | POA: Insufficient documentation

## 2017-02-18 DIAGNOSIS — Z803 Family history of malignant neoplasm of breast: Secondary | ICD-10-CM | POA: Diagnosis not present

## 2017-02-18 DIAGNOSIS — K219 Gastro-esophageal reflux disease without esophagitis: Secondary | ICD-10-CM | POA: Diagnosis not present

## 2017-02-18 DIAGNOSIS — C786 Secondary malignant neoplasm of retroperitoneum and peritoneum: Secondary | ICD-10-CM | POA: Diagnosis not present

## 2017-02-18 DIAGNOSIS — Z923 Personal history of irradiation: Secondary | ICD-10-CM | POA: Insufficient documentation

## 2017-02-18 DIAGNOSIS — C569 Malignant neoplasm of unspecified ovary: Secondary | ICD-10-CM

## 2017-02-18 DIAGNOSIS — Z5111 Encounter for antineoplastic chemotherapy: Secondary | ICD-10-CM | POA: Insufficient documentation

## 2017-02-18 DIAGNOSIS — K1230 Oral mucositis (ulcerative), unspecified: Secondary | ICD-10-CM | POA: Insufficient documentation

## 2017-02-18 DIAGNOSIS — K5730 Diverticulosis of large intestine without perforation or abscess without bleeding: Secondary | ICD-10-CM | POA: Diagnosis not present

## 2017-02-18 DIAGNOSIS — E039 Hypothyroidism, unspecified: Secondary | ICD-10-CM | POA: Insufficient documentation

## 2017-02-18 LAB — CBC WITH DIFFERENTIAL/PLATELET
Basophils Absolute: 0 10*3/uL (ref 0–0.1)
Basophils Relative: 1 %
EOS ABS: 0.1 10*3/uL (ref 0–0.7)
EOS PCT: 2 %
HCT: 36 % (ref 35.0–47.0)
HEMOGLOBIN: 12.5 g/dL (ref 12.0–16.0)
LYMPHS ABS: 2.3 10*3/uL (ref 1.0–3.6)
Lymphocytes Relative: 51 %
MCH: 33.6 pg (ref 26.0–34.0)
MCHC: 34.7 g/dL (ref 32.0–36.0)
MCV: 96.8 fL (ref 80.0–100.0)
MONO ABS: 0.8 10*3/uL (ref 0.2–0.9)
MONOS PCT: 17 %
NEUTROS PCT: 29 %
Neutro Abs: 1.3 10*3/uL — ABNORMAL LOW (ref 1.4–6.5)
PLATELETS: 273 10*3/uL (ref 150–440)
RBC: 3.72 MIL/uL — ABNORMAL LOW (ref 3.80–5.20)
RDW: 17.5 % — AB (ref 11.5–14.5)
WBC: 4.5 10*3/uL (ref 3.6–11.0)

## 2017-02-18 LAB — URINALYSIS, COMPLETE (UACMP) WITH MICROSCOPIC
BACTERIA UA: NONE SEEN
BILIRUBIN URINE: NEGATIVE
Glucose, UA: NEGATIVE mg/dL
Hgb urine dipstick: NEGATIVE
KETONES UR: NEGATIVE mg/dL
NITRITE: NEGATIVE
Protein, ur: NEGATIVE mg/dL
RBC / HPF: NONE SEEN RBC/hpf (ref 0–5)
SPECIFIC GRAVITY, URINE: 1.014 (ref 1.005–1.030)
pH: 6 (ref 5.0–8.0)

## 2017-02-18 LAB — COMPREHENSIVE METABOLIC PANEL
ALBUMIN: 3.5 g/dL (ref 3.5–5.0)
ALK PHOS: 65 U/L (ref 38–126)
ALT: 16 U/L (ref 14–54)
ANION GAP: 5 (ref 5–15)
AST: 23 U/L (ref 15–41)
BUN: 15 mg/dL (ref 6–20)
CALCIUM: 9.1 mg/dL (ref 8.9–10.3)
CHLORIDE: 107 mmol/L (ref 101–111)
CO2: 26 mmol/L (ref 22–32)
Creatinine, Ser: 1.03 mg/dL — ABNORMAL HIGH (ref 0.44–1.00)
GFR calc non Af Amer: 53 mL/min — ABNORMAL LOW (ref >60)
GLUCOSE: 104 mg/dL — AB (ref 65–99)
POTASSIUM: 4.4 mmol/L (ref 3.5–5.1)
SODIUM: 138 mmol/L (ref 135–145)
Total Bilirubin: 0.5 mg/dL (ref 0.3–1.2)
Total Protein: 6.6 g/dL (ref 6.5–8.1)

## 2017-02-18 MED ORDER — FAMOTIDINE IN NACL 20-0.9 MG/50ML-% IV SOLN
20.0000 mg | Freq: Once | INTRAVENOUS | Status: AC
  Administered 2017-02-18: 20 mg via INTRAVENOUS

## 2017-02-18 MED ORDER — SODIUM CHLORIDE 0.9% FLUSH
10.0000 mL | INTRAVENOUS | Status: DC | PRN
  Administered 2017-02-18: 10 mL via INTRAVENOUS
  Filled 2017-02-18: qty 10

## 2017-02-18 MED ORDER — SODIUM CHLORIDE 0.9 % IV SOLN
60.0000 mg/m2 | Freq: Once | INTRAVENOUS | Status: AC
  Administered 2017-02-18: 114 mg via INTRAVENOUS
  Filled 2017-02-18: qty 19

## 2017-02-18 MED ORDER — SODIUM CHLORIDE 0.9 % IV SOLN
Freq: Once | INTRAVENOUS | Status: AC
  Administered 2017-02-18: 10:00:00 via INTRAVENOUS
  Filled 2017-02-18: qty 1000

## 2017-02-18 MED ORDER — SODIUM CHLORIDE 0.9 % IV SOLN
800.0000 mg | Freq: Once | INTRAVENOUS | Status: AC
  Administered 2017-02-18: 800 mg via INTRAVENOUS
  Filled 2017-02-18: qty 32

## 2017-02-18 MED ORDER — HEPARIN SOD (PORK) LOCK FLUSH 100 UNIT/ML IV SOLN
500.0000 [IU] | Freq: Once | INTRAVENOUS | Status: AC
  Administered 2017-02-18: 500 [IU] via INTRAVENOUS

## 2017-02-18 MED ORDER — DIPHENHYDRAMINE HCL 50 MG/ML IJ SOLN
25.0000 mg | Freq: Once | INTRAMUSCULAR | Status: AC
  Administered 2017-02-18: 25 mg via INTRAVENOUS

## 2017-02-18 MED ORDER — SODIUM CHLORIDE 0.9 % IV SOLN
20.0000 mg | Freq: Once | INTRAVENOUS | Status: AC
  Administered 2017-02-18: 20 mg via INTRAVENOUS
  Filled 2017-02-18: qty 2

## 2017-02-18 NOTE — Assessment & Plan Note (Addendum)
Recurrent  Platinum refractory ovarian cancer [dec 2017] slight increase in the CA-125.May 4th CT- improved/ resolved- pelvic peritoneal nodularity; no new nodularity noted. Ca-125 improving; exam [with Dr.Secord Improved]; On Taxol- Avastin [day1,8 & 15 every 28 days [off-day 22]; Avastin every 2 weeks- tolerating well; currently status post 5 cycles.  # Proceed with cycle #6; day-1 Labs today reviewed;  acceptable for treatment today. Likely plan a total of 6 cycles of Taxol Avastin; and then continue Avastin maintenance.  # Mucositis- G-2; salt/baking soda rinses; continue abrevia; lysine improved; continue the same.    # nasal stuffiness- flonase  # Teeth cleaning in end of July 2018..  # weekly chemo/labs; in 2 weeks; will order CT at next visit- avsatin maintenance mid aug or sept.

## 2017-02-18 NOTE — Progress Notes (Signed)
[place] OFFICE PROGRESS NOTE  Patient Care Team: Jerrol Banana., MD as PCP - General (Family Medicine) Gillis Ends, MD as Referring Physician (Obstetrics and Gynecology) Bary Castilla Forest Gleason, MD as Consulting Physician (General Surgery)  Ovarian cancer [facility])   Staging form: Ovary, AJCC 7th Edition     Clinical: Stage IIIC (T3c, N0, M0) - Unsigned    Oncology History   # April- MAY 2016- OVARIAN CANCER STAGE IIIC; CA-125 +2300+;  s/p OPTIMAL DEBULKING SURGERY   # MAY 2016[name] [finished in Sep 19th 2017]  # MAY CT 2017- RECURRENT/Peritoneal carcinomatosis/pelvic implant ~1.2cm/Ca-125-34;   # July 3rd- CARBO-DOXIL q 4W [with neulasta]; CT May 11 2016- slight increase peritoneal / stable nodules. Cont chemo [finished DEC 2017]. DEC 28th 2017 CT- Increase in peritoneal deposits; increasing Ca 125 [60s]  # G-1-2 hand foot syndrome-   # LEFT BREAST CA s/p Lumpec & RT s/p TAM   # Afib [cardiology]; JUNE 2017- MUGA 62%  MOLECULAR TESTING- ATM genetic mutation; TUMOR BRCA- NEG; Myriad genomic instability- NEGATIVE     Ovarian cancer, unspecified laterality ([place])   12/16/2014 Initial Diagnosis    Ovarian cancer       INTERVAL HISTORY:  76 year old female patient with above history of ovarian cancer recurrent Platinum Refractory ovarian cancer [dec 2017] Patient is currently on Avastin and Taxol. Currently status post cycle #5 day-15 Approximately 2 weeks ago- Is here to proceed with cycle #6 day #1 today  Patient complains of mild-to-moderate fatigue. Otherwise denies having any worsening oral ulcers. Complains of nasal stuffiness. Intermittent epistaxis with bloody mucus only.  Otherwise denies any significant nausea vomiting. Complains of mild tingling and numbness in her extremities. Denies any abdominal pain. Denies any abdominal distention. No chest pain or shortness of breath or cough.No headaches.   REVIEW OF SYSTEMS:  A complete  10 point review of system is done which is negative except mentioned above/history of present illness.   PAST MEDICAL HISTORY :  Past Medical History:  Diagnosis Date  . Atrial fibrillation ([place])   . Cancer of breast ([place]) 1998   Left  . CINV (chemotherapy-induced nausea and vomiting)   . GERD (gastroesophageal reflux disease)   . Hyperlipidemia   . Hypothyroidism   . Mitral insufficiency   . Ovarian cancer [facility])    s/p BSO optimal tumor debulking May 2016  . PVC (premature ventricular contraction)     PAST SURGICAL HISTORY :   Past Surgical History:  Procedure Laterality Date  . ABDOMINAL HYSTERECTOMY    . BILATERAL SALPINGOOPHORECTOMY  May 2016   with optimal tumor debulking   . BREAST SURGERY     Left  . CESAREAN SECTION     x2  . CHOLECYSTECTOMY    . COLONOSCOPY  2006  . DEBULKING N/A 12/22/2014   Procedure: DEBULKING;  Surgeon: Gillis Ends, MD;  Location: [facility] ORS;  Service: Gynecology;  Laterality: N/A;  . LAPAROTOMY N/A 12/22/2014   Procedure: EXPLORATORY LAPAROTOMY;  Surgeon: Robert Bellow, MD;  Location: [facility] ORS;  Service: General;  Laterality: N/A;  . LAPAROTOMY WITH STAGING N/A 12/22/2014   Procedure: LAPAROTOMY WITH STAGING;  Surgeon: Gillis Ends, MD;  Location: [facility] ORS;  Service: Gynecology;  Laterality: N/A;  . PERIPHERAL VASCULAR CATHETERIZATION Left 12/22/2014   Procedure: PORTA CATH INSERTION;  Surgeon: Robert Bellow, MD;  Location: [facility] ORS;  Service: General;  Laterality: Left;  . PORTACATH PLACEMENT Right 12/22/2014   Procedure: INSERTION  PORT-A-CATH;  Surgeon: Robert Bellow, MD;  Location: [facility] ORS;  Service: General;  Laterality: Right;  . SALPINGOOPHORECTOMY Bilateral 12/22/2014   Procedure: SALPINGO OOPHORECTOMY;  Surgeon: Gillis Ends, MD;  Location: [facility] ORS;  Service: Gynecology;  Laterality: Bilateral;  . UPPER GI ENDOSCOPY  09/25/04   hiatus hernia, schatzki ring and a single gastric polyp    FAMILY HISTORY :    Family History  Problem Relation Age of Onset  . Cancer Mother        breast, throat, and stomach  . Breast cancer Mother   . Heart disease Father   . Pulmonary embolism Sister   . Cancer Brother 60       angiosarcoma of the chest; carcinoid small intestinal tumor  . Hypertension Brother   . Cancer Maternal Aunt        breast cancer  . Cancer Maternal Grandfather 54       pancreatic    SOCIAL HISTORY:   Social History  Substance Use Topics  . Smoking status: Never Smoker  . Smokeless tobacco: Never Used  . Alcohol use No    ALLERGIES:  is allergic to carafate [sucralfate] and sulfa antibiotics.  MEDICATIONS:  Current Outpatient Prescriptions  Medication Sig Dispense Refill  . Cholecalciferol (VITAMIN D) 2000 units tablet Take 1 tablet (2,000 Units total) by mouth daily. 30 tablet 12  . lactulose (CHRONULAC) 10 GM/15ML solution TAKE 15MLS (10G TOTAL) BY MOUTH THREE TIMES A DAY AS DIRECTED BY PHYSICIAN. 240 mL 1  . levothyroxine (SYNTHROID, LEVOTHROID) 50 MCG tablet Take 1 tablet (50 mcg total) by mouth daily. 30 tablet 12  . lidocaine-prilocaine (EMLA) cream Apply to port area 30-45 mins prior to chemo. 30 g 5  . LORazepam (ATIVAN) 0.5 MG tablet Take 0.5 tablets (0.25 mg total) by mouth at bedtime. 30 tablet 4  . Lysine 500 MG TABS Take 1 tablet by mouth daily.    . pantoprazole (PROTONIX) 40 MG tablet Take 1 tablet (40 mg total) by mouth daily. 30 tablet 12  . polyethylene glycol powder (GLYCOLAX/MIRALAX) powder TAKE 17GM (LINE INSIDE CAP) IN EIGHT OUNCES OF WATER OR OTHER LIQUID DAILY 255 g 1  . propafenone (RYTHMOL) 225 MG tablet Take 225 mg by mouth 2 (two) times daily.    Marland Kitchen acetaminophen (TYLENOL) 500 MG chewable tablet Chew 500 mg by mouth every 6 (six) hours as needed for pain.    . Docosanol (ABREVA EX) Apply 1 application topically as needed. Fever blisters    . Multiple Vitamin (MULTIVITAMIN) capsule Take 1 capsule by mouth daily.    . ondansetron (ZOFRAN) 4 MG  tablet Take 1 tablet (4 mg total) by mouth every 8 (eight) hours as needed for nausea or vomiting. (Patient not taking: Reported on 02/18/2017) 30 tablet 3  . prochlorperazine (COMPAZINE) 10 MG tablet Take 1 tablet (10 mg total) by mouth every 6 (six) hours as needed for nausea or vomiting. (Patient not taking: Reported on 02/18/2017) 30 tablet 0  . traMADol (ULTRAM) 50 MG tablet Take 1 tablet (50 mg total) by mouth every 6 (six) hours as needed. (Patient not taking: Reported on 02/04/2017) 30 tablet 0   No current facility-administered medications for this visit.    Facility-Administered Medications Ordered in Other Visits  Medication Dose Route Frequency Provider Last Rate Last Dose  . sodium chloride 0.9 % injection 10 mL  10 mL Intracatheter PRN Forest Gleason, MD   10 mL at 02/07/15 0930  . sodium chloride flush (  NS) 0.9 % injection 10 mL  10 mL Intravenous PRN Cammie Sickle, MD   10 mL at 02/18/17 0830    PHYSICAL EXAMINATION: ECOG PERFORMANCE STATUS: 0 - Asymptomatic  BP 137/80 (BP Location: Left Arm, Patient Position: Sitting)   Pulse 96   Temp 97.6 F (36.4 C) (Tympanic)   Resp 20   Ht 5' 3.5" (1.613 m)   Wt 188 lb 14.4 oz (85.7 kg)   BMI 32.94 kg/m   Filed Weights   02/18/17 0900  Weight: 188 lb 14.4 oz (85.7 kg)    GENERAL: Well-nourished well-developed; Alert, no distress and comfortAccompanied by her husband. EYES: no pallor or icterus OROPHARYNX: no thrush or ulceration; good dentition  NECK: supple, no masses felt LYMPH:  no palpable lymphadenopathy in the cervical, axillary or inguinal regions LUNGS: clear to auscultation and  No wheeze or crackles HEART/CVS: regular rate & rhythm and no murmurs; No lower extremity edema ABDOMEN:abdomen soft, non-tender and normal bowel sounds Musculoskeletal:no cyanosis of digits and no clubbing  PSYCH: alert & oriented x 3 with fluent speech NEURO: no focal motor/sensory deficits SKIN:  Normal. No rash noted.    LABORATORY DATA:  I have reviewed the data as listed    Component Value Date/Time   NA 138 02/18/2017 0830   NA 140 12/14/2014 1037   K 4.4 02/18/2017 0830   K 4.4 12/14/2014 1037   CL 107 02/18/2017 0830   CL 106 12/14/2014 1037   CO2 26 02/18/2017 0830   CO2 26 12/14/2014 1037   GLUCOSE 104 (H) 02/18/2017 0830   GLUCOSE 87 12/14/2014 1037   BUN 15 02/18/2017 0830   BUN 12 12/14/2014 1037   CREATININE 1.03 (H) 02/18/2017 0830   CREATININE 0.97 12/14/2014 1037   CALCIUM 9.1 02/18/2017 0830   CALCIUM 9.2 12/14/2014 1037   PROT 6.6 02/18/2017 0830   PROT 7.3 12/14/2014 1037   ALBUMIN 3.5 02/18/2017 0830   ALBUMIN 3.8 12/14/2014 1037   AST 23 02/18/2017 0830   AST 20 12/14/2014 1037   ALT 16 02/18/2017 0830   ALT 11 (L) 12/14/2014 1037   ALKPHOS 65 02/18/2017 0830   ALKPHOS 138 (H) 12/14/2014 1037   BILITOT 0.5 02/18/2017 0830   BILITOT 0.5 12/14/2014 1037   GFRNONAA 53 (L) 02/18/2017 0830   GFRNONAA 59 (L) 12/14/2014 1037   GFRAA >60 02/18/2017 0830   GFRAA >60 12/14/2014 1037    No results found for: SPEP, UPEP  Lab Results  Component Value Date   WBC 4.5 02/18/2017   NEUTROABS 1.3 (L) 02/18/2017   HGB 12.5 02/18/2017   HCT 36.0 02/18/2017   MCV 96.8 02/18/2017   PLT 273 02/18/2017      Chemistry      Component Value Date/Time   NA 138 02/18/2017 0830   NA 140 12/14/2014 1037   K 4.4 02/18/2017 0830   K 4.4 12/14/2014 1037   CL 107 02/18/2017 0830   CL 106 12/14/2014 1037   CO2 26 02/18/2017 0830   CO2 26 12/14/2014 1037   BUN 15 02/18/2017 0830   BUN 12 12/14/2014 1037   CREATININE 1.03 (H) 02/18/2017 0830   CREATININE 0.97 12/14/2014 1037   GLU 84 03/16/2014      Component Value Date/Time   CALCIUM 9.1 02/18/2017 0830   CALCIUM 9.2 12/14/2014 1037   ALKPHOS 65 02/18/2017 0830   ALKPHOS 138 (H) 12/14/2014 1037   AST 23 02/18/2017 0830   AST 20 12/14/2014 1037  ALT 16 02/18/2017 0830   ALT 11 (L) 12/14/2014 1037   BILITOT 0.5 02/18/2017  0830   BILITOT 0.5 12/14/2014 1037     IMPRESSION: 1. Today's study demonstrates a positive response to therapy, as evidenced by regression of the previously noted peritoneal implant in the low anatomic pelvis. No new sites of metastatic disease are noted elsewhere in the abdomen or pelvis. 2. Stable appearance of lesions in the liver which are most compatible with cavernous hemangiomas. 3. Colonic diverticulosis without evidence of acute diverticulitis. 4. Aortic atherosclerosis. 5. Small hiatal hernia.  Electronically Signed   By: Vinnie Langton M.D.   On: 12/21/2016 14:06   RADIOGRAPHIC STUDIES: I have personally reviewed the radiological images as listed and agreed with the findings in the report. No results found.   ASSESSMENT & PLAN:  Ovarian cancer, unspecified laterality ([place]) Recurrent  Platinum refractory ovarian cancer [dec 2017] slight increase in the CA-125.May 4th CT- improved/ resolved- pelvic peritoneal nodularity; no new nodularity noted. Ca-125 improving; exam [with Dr.Secord Improved]; On Taxol- Avastin [day1,8 & 15 every 28 days [off-day 22]; Avastin every 2 weeks- tolerating well; currently status post 5 cycles.  # Proceed with cycle #6; day-1 Labs today reviewed;  acceptable for treatment today. Likely plan a total of 6 cycles of Taxol Avastin; and then continue Avastin maintenance.  # Mucositis- G-2; salt/baking soda rinses; continue abrevia; lysine improved; continue the same.    # nasal stuffiness- flonase  # Teeth cleaning in end of July 2018..  # weekly chemo/labs; in 2 weeks; will order CT at next visit- avsatin maintenance mid aug or sept.    Orders Placed This Encounter  Procedures  . CBC with Differential/Platelet    Standing Status:   Future    Standing Expiration Date:   02/18/2018  . CA 125    Standing Status:   Future    Standing Expiration Date:   03/25/2018   All questions were answered. The patient knows to call the clinic with any  problems, questions or concerns.      Cammie Sickle, MD 02/18/2017 5:40 PM

## 2017-02-25 ENCOUNTER — Inpatient Hospital Stay: Payer: PPO

## 2017-02-25 VITALS — BP 131/84 | HR 96 | Temp 97.6°F | Resp 20

## 2017-02-25 DIAGNOSIS — Z5111 Encounter for antineoplastic chemotherapy: Secondary | ICD-10-CM | POA: Diagnosis not present

## 2017-02-25 DIAGNOSIS — C569 Malignant neoplasm of unspecified ovary: Secondary | ICD-10-CM

## 2017-02-25 LAB — CBC WITH DIFFERENTIAL/PLATELET
BASOS PCT: 0 %
Basophils Absolute: 0 10*3/uL (ref 0–0.1)
EOS ABS: 0.1 10*3/uL (ref 0–0.7)
EOS PCT: 3 %
HEMATOCRIT: 36.5 % (ref 35.0–47.0)
Hemoglobin: 12.6 g/dL (ref 12.0–16.0)
Lymphocytes Relative: 49 %
Lymphs Abs: 2.5 10*3/uL (ref 1.0–3.6)
MCH: 33.3 pg (ref 26.0–34.0)
MCHC: 34.5 g/dL (ref 32.0–36.0)
MCV: 96.6 fL (ref 80.0–100.0)
MONO ABS: 0.5 10*3/uL (ref 0.2–0.9)
MONOS PCT: 10 %
Neutro Abs: 1.9 10*3/uL (ref 1.4–6.5)
Neutrophils Relative %: 38 %
Platelets: 248 10*3/uL (ref 150–440)
RBC: 3.78 MIL/uL — ABNORMAL LOW (ref 3.80–5.20)
RDW: 17.2 % — AB (ref 11.5–14.5)
WBC: 5.1 10*3/uL (ref 3.6–11.0)

## 2017-02-25 MED ORDER — SODIUM CHLORIDE 0.9 % IV SOLN
20.0000 mg | Freq: Once | INTRAVENOUS | Status: AC
  Administered 2017-02-25: 20 mg via INTRAVENOUS
  Filled 2017-02-25: qty 2

## 2017-02-25 MED ORDER — SODIUM CHLORIDE 0.9 % IV SOLN
Freq: Once | INTRAVENOUS | Status: AC
  Administered 2017-02-25: 09:00:00 via INTRAVENOUS
  Filled 2017-02-25: qty 1000

## 2017-02-25 MED ORDER — SODIUM CHLORIDE 0.9% FLUSH
10.0000 mL | INTRAVENOUS | Status: DC | PRN
  Administered 2017-02-25: 10 mL via INTRAVENOUS
  Filled 2017-02-25: qty 10

## 2017-02-25 MED ORDER — SODIUM CHLORIDE 0.9 % IV SOLN
60.0000 mg/m2 | Freq: Once | INTRAVENOUS | Status: AC
  Administered 2017-02-25: 114 mg via INTRAVENOUS
  Filled 2017-02-25: qty 19

## 2017-02-25 MED ORDER — DIPHENHYDRAMINE HCL 50 MG/ML IJ SOLN
25.0000 mg | Freq: Once | INTRAMUSCULAR | Status: AC
  Administered 2017-02-25: 25 mg via INTRAVENOUS

## 2017-02-25 MED ORDER — HEPARIN SOD (PORK) LOCK FLUSH 100 UNIT/ML IV SOLN
500.0000 [IU] | Freq: Once | INTRAVENOUS | Status: AC
  Administered 2017-02-25: 500 [IU] via INTRAVENOUS

## 2017-02-25 MED ORDER — HEPARIN SOD (PORK) LOCK FLUSH 100 UNIT/ML IV SOLN: 500.0000 [IU] | Freq: Once | INTRAVENOUS | Status: DC | PRN

## 2017-02-25 MED ORDER — FAMOTIDINE IN NACL 20-0.9 MG/50ML-% IV SOLN
20.0000 mg | Freq: Once | INTRAVENOUS | Status: AC
  Administered 2017-02-25: 20 mg via INTRAVENOUS

## 2017-02-26 LAB — CA 125: CANCER ANTIGEN (CA) 125: 13.3 U/mL (ref 0.0–38.1)

## 2017-03-01 ENCOUNTER — Other Ambulatory Visit: Payer: Self-pay | Admitting: Internal Medicine

## 2017-03-04 ENCOUNTER — Inpatient Hospital Stay: Payer: PPO

## 2017-03-04 ENCOUNTER — Inpatient Hospital Stay (HOSPITAL_BASED_OUTPATIENT_CLINIC_OR_DEPARTMENT_OTHER): Payer: PPO | Admitting: Internal Medicine

## 2017-03-04 VITALS — BP 118/68 | HR 86 | Temp 96.6°F | Resp 16 | Wt 191.4 lb

## 2017-03-04 DIAGNOSIS — Z800 Family history of malignant neoplasm of digestive organs: Secondary | ICD-10-CM

## 2017-03-04 DIAGNOSIS — C569 Malignant neoplasm of unspecified ovary: Secondary | ICD-10-CM | POA: Diagnosis not present

## 2017-03-04 DIAGNOSIS — Z79899 Other long term (current) drug therapy: Secondary | ICD-10-CM

## 2017-03-04 DIAGNOSIS — K5730 Diverticulosis of large intestine without perforation or abscess without bleeding: Secondary | ICD-10-CM

## 2017-03-04 DIAGNOSIS — R040 Epistaxis: Secondary | ICD-10-CM

## 2017-03-04 DIAGNOSIS — Z923 Personal history of irradiation: Secondary | ICD-10-CM

## 2017-03-04 DIAGNOSIS — I4891 Unspecified atrial fibrillation: Secondary | ICD-10-CM

## 2017-03-04 DIAGNOSIS — K1230 Oral mucositis (ulcerative), unspecified: Secondary | ICD-10-CM | POA: Diagnosis not present

## 2017-03-04 DIAGNOSIS — E785 Hyperlipidemia, unspecified: Secondary | ICD-10-CM | POA: Diagnosis not present

## 2017-03-04 DIAGNOSIS — I700 Atherosclerosis of aorta: Secondary | ICD-10-CM

## 2017-03-04 DIAGNOSIS — E039 Hypothyroidism, unspecified: Secondary | ICD-10-CM

## 2017-03-04 DIAGNOSIS — Z853 Personal history of malignant neoplasm of breast: Secondary | ICD-10-CM

## 2017-03-04 DIAGNOSIS — Z803 Family history of malignant neoplasm of breast: Secondary | ICD-10-CM

## 2017-03-04 DIAGNOSIS — C786 Secondary malignant neoplasm of retroperitoneum and peritoneum: Secondary | ICD-10-CM

## 2017-03-04 DIAGNOSIS — K449 Diaphragmatic hernia without obstruction or gangrene: Secondary | ICD-10-CM | POA: Diagnosis not present

## 2017-03-04 DIAGNOSIS — K219 Gastro-esophageal reflux disease without esophagitis: Secondary | ICD-10-CM

## 2017-03-04 DIAGNOSIS — I340 Nonrheumatic mitral (valve) insufficiency: Secondary | ICD-10-CM

## 2017-03-04 DIAGNOSIS — Z5111 Encounter for antineoplastic chemotherapy: Secondary | ICD-10-CM | POA: Diagnosis not present

## 2017-03-04 DIAGNOSIS — I493 Ventricular premature depolarization: Secondary | ICD-10-CM

## 2017-03-04 LAB — COMPREHENSIVE METABOLIC PANEL
ALBUMIN: 3.3 g/dL — AB (ref 3.5–5.0)
ALK PHOS: 57 U/L (ref 38–126)
ALT: 16 U/L (ref 14–54)
ANION GAP: 4 — AB (ref 5–15)
AST: 20 U/L (ref 15–41)
BUN: 12 mg/dL (ref 6–20)
CHLORIDE: 106 mmol/L (ref 101–111)
CO2: 27 mmol/L (ref 22–32)
Calcium: 9.3 mg/dL (ref 8.9–10.3)
Creatinine, Ser: 0.91 mg/dL (ref 0.44–1.00)
GFR calc non Af Amer: >60 mL/min (ref >60)
GLUCOSE: 112 mg/dL — AB (ref 65–99)
Potassium: 4.1 mmol/L (ref 3.5–5.1)
SODIUM: 137 mmol/L (ref 135–145)
Total Bilirubin: 0.5 mg/dL (ref 0.3–1.2)
Total Protein: 6.2 g/dL — ABNORMAL LOW (ref 6.5–8.1)

## 2017-03-04 LAB — URINALYSIS, COMPLETE (UACMP) WITH MICROSCOPIC
BACTERIA UA: NONE SEEN
BILIRUBIN URINE: NEGATIVE
Glucose, UA: NEGATIVE mg/dL
Hgb urine dipstick: NEGATIVE
KETONES UR: NEGATIVE mg/dL
LEUKOCYTES UA: NEGATIVE
NITRITE: NEGATIVE
PROTEIN: NEGATIVE mg/dL
SQUAMOUS EPITHELIAL / LPF: NONE SEEN
Specific Gravity, Urine: 1.004 — ABNORMAL LOW (ref 1.005–1.030)
pH: 6 (ref 5.0–8.0)

## 2017-03-04 LAB — CBC WITH DIFFERENTIAL/PLATELET
BASOS PCT: 1 %
Basophils Absolute: 0 10*3/uL (ref 0–0.1)
EOS ABS: 0.1 10*3/uL (ref 0–0.7)
EOS PCT: 3 %
HCT: 34.6 % — ABNORMAL LOW (ref 35.0–47.0)
HEMOGLOBIN: 12 g/dL (ref 12.0–16.0)
LYMPHS ABS: 1.9 10*3/uL (ref 1.0–3.6)
Lymphocytes Relative: 47 %
MCH: 33.3 pg (ref 26.0–34.0)
MCHC: 34.8 g/dL (ref 32.0–36.0)
MCV: 95.9 fL (ref 80.0–100.0)
Monocytes Absolute: 0.4 10*3/uL (ref 0.2–0.9)
Monocytes Relative: 10 %
NEUTROS PCT: 39 %
Neutro Abs: 1.6 10*3/uL (ref 1.4–6.5)
PLATELETS: 250 10*3/uL (ref 150–440)
RBC: 3.61 MIL/uL — AB (ref 3.80–5.20)
RDW: 17.3 % — ABNORMAL HIGH (ref 11.5–14.5)
WBC: 4 10*3/uL (ref 3.6–11.0)

## 2017-03-04 MED ORDER — HEPARIN SOD (PORK) LOCK FLUSH 100 UNIT/ML IV SOLN
INTRAVENOUS | Status: AC
  Filled 2017-03-04: qty 5

## 2017-03-04 MED ORDER — DIPHENHYDRAMINE HCL 50 MG/ML IJ SOLN
25.0000 mg | Freq: Once | INTRAMUSCULAR | Status: AC
  Administered 2017-03-04: 25 mg via INTRAVENOUS
  Filled 2017-03-04: qty 1

## 2017-03-04 MED ORDER — SODIUM CHLORIDE 0.9% FLUSH
10.0000 mL | INTRAVENOUS | Status: DC | PRN
Start: 2017-03-04 — End: 2017-03-04
  Administered 2017-03-04: 10 mL via INTRAVENOUS
  Filled 2017-03-04: qty 10

## 2017-03-04 MED ORDER — SODIUM CHLORIDE 0.9 % IV SOLN
Freq: Once | INTRAVENOUS | Status: AC
  Administered 2017-03-04: 11:00:00 via INTRAVENOUS
  Filled 2017-03-04: qty 1000

## 2017-03-04 MED ORDER — HEPARIN SOD (PORK) LOCK FLUSH 100 UNIT/ML IV SOLN
500.0000 [IU] | Freq: Once | INTRAVENOUS | Status: AC
  Administered 2017-03-04: 500 [IU] via INTRAVENOUS

## 2017-03-04 MED ORDER — FAMOTIDINE IN NACL 20-0.9 MG/50ML-% IV SOLN
20.0000 mg | Freq: Once | INTRAVENOUS | Status: AC
  Administered 2017-03-04: 20 mg via INTRAVENOUS
  Filled 2017-03-04: qty 50

## 2017-03-04 MED ORDER — SODIUM CHLORIDE 0.9 % IV SOLN
60.0000 mg/m2 | Freq: Once | INTRAVENOUS | Status: AC
  Administered 2017-03-04: 114 mg via INTRAVENOUS
  Filled 2017-03-04: qty 19

## 2017-03-04 MED ORDER — SODIUM CHLORIDE 0.9 % IV SOLN
20.0000 mg | Freq: Once | INTRAVENOUS | Status: AC
  Administered 2017-03-04: 20 mg via INTRAVENOUS
  Filled 2017-03-04: qty 2

## 2017-03-04 MED ORDER — SODIUM CHLORIDE 0.9 % IV SOLN
800.0000 mg | Freq: Once | INTRAVENOUS | Status: AC
  Administered 2017-03-04: 800 mg via INTRAVENOUS
  Filled 2017-03-04: qty 32

## 2017-03-04 NOTE — Progress Notes (Signed)
Patient does not offer any problems today.  

## 2017-03-04 NOTE — Assessment & Plan Note (Addendum)
Recurrent  Platinum refractory ovarian cancer [dec 2017] slight increase in the CA-125.May 4th CT- improved/ resolved- pelvic peritoneal nodularity; no new nodularity noted. Ca-125 improving; exam [with Dr.Secord Improved]; On Taxol- Avastin [day1,8 & 15 every 28 days [off-day 22]; Avastin every 2 weeks- tolerating well; currently status post 6; day -8. .  # Proceed with cycle #6; day-15 Labs today reviewed;  acceptable for treatment today. Will finish a total of 6 cycles of Taxol Avastin; and then continue Avastin maintenance.  # Mucositis- G-2; salt/baking soda rinses;improved.   # nasal stuffiness- flonase recommended.   # folow up in 4 weeks/labs- ca-125/CT scan few days prior; Avastin; UA.

## 2017-03-04 NOTE — Progress Notes (Signed)
Proceed with treatment today per Dr. Brahmanday. LJ 

## 2017-03-04 NOTE — Progress Notes (Signed)
[facility] OFFICE PROGRESS NOTE  Patient Care Team: Jerrol Banana., MD as PCP - General (Family Medicine) Gillis Ends, MD as Referring Physician (Obstetrics and Gynecology) Bary Castilla Forest Gleason, MD as Consulting Physician (General Surgery)  Ovarian cancer [facility])   Staging form: Ovary, AJCC 7th Edition     Clinical: Stage IIIC (T3c, N0, M0) - Unsigned    Oncology History   # April- MAY 2016- OVARIAN CANCER STAGE IIIC; CA-125 +2300+;  s/p OPTIMAL DEBULKING SURGERY   # MAY 2016[name] [finished in Sep 19th 2017]  # MAY CT 2017- RECURRENT/Peritoneal carcinomatosis/pelvic implant ~1.2cm/Ca-125-34;   # July 3rd- CARBO-DOXIL q 4W [with neulasta]; CT May 11 2016- slight increase peritoneal / stable nodules. Cont chemo [finished DEC 2017]. DEC 28th 2017 CT- Increase in peritoneal deposits; increasing Ca 125 [60s]  # G-1-2 hand foot syndrome-   # LEFT BREAST CA s/p Lumpec & RT s/p TAM   # Afib [cardiology]; JUNE 2017- MUGA 62%  MOLECULAR TESTING- ATM genetic mutation; TUMOR BRCA- NEG; Myriad genomic instability- NEGATIVE     Ovarian cancer, unspecified laterality ([place])   12/16/2014 Initial Diagnosis    Ovarian cancer       INTERVAL HISTORY:  76 year old female patient with above history of ovarian cancer recurrent Platinum Refractory ovarian cancer [dec 2017] Patient is currently on Avastin and Taxol. Currently status post cycle #5 day-15 Approximately 2 weeks ago- Is here to proceed with cycle #6 day #15 today.  Patient complains of mild nasal stuffiness. Using Flonase as needed. Otherwise denies any headaches. Denies any significant tingling and numbness in extremities. Intermittent bloody mucus on blowing the nose. No spontaneous epistaxis.  Otherwise denies any significant nausea vomiting. Complains of mild tingling and numbness in her extremities. Denies any abdominal pain. Denies any abdominal distention. No chest pain or shortness of  breath or cough.No headaches.   REVIEW OF SYSTEMS:  A complete 10 point review of system is done which is negative except mentioned above/history of present illness.   PAST MEDICAL HISTORY :  Past Medical History:  Diagnosis Date  . Atrial fibrillation ([place])   . Cancer of breast ([place]) 1998   Left  . CINV (chemotherapy-induced nausea and vomiting)   . GERD (gastroesophageal reflux disease)   . Hyperlipidemia   . Hypothyroidism   . Mitral insufficiency   . Ovarian cancer [facility])    s/p BSO optimal tumor debulking May 2016  . PVC (premature ventricular contraction)     PAST SURGICAL HISTORY :   Past Surgical History:  Procedure Laterality Date  . ABDOMINAL HYSTERECTOMY    . BILATERAL SALPINGOOPHORECTOMY  May 2016   with optimal tumor debulking   . BREAST SURGERY     Left  . CESAREAN SECTION     x2  . CHOLECYSTECTOMY    . COLONOSCOPY  2006  . DEBULKING N/A 12/22/2014   Procedure: DEBULKING;  Surgeon: Gillis Ends, MD;  Location: [facility] ORS;  Service: Gynecology;  Laterality: N/A;  . LAPAROTOMY N/A 12/22/2014   Procedure: EXPLORATORY LAPAROTOMY;  Surgeon: Robert Bellow, MD;  Location: [facility] ORS;  Service: General;  Laterality: N/A;  . LAPAROTOMY WITH STAGING N/A 12/22/2014   Procedure: LAPAROTOMY WITH STAGING;  Surgeon: Gillis Ends, MD;  Location: [facility] ORS;  Service: Gynecology;  Laterality: N/A;  . PERIPHERAL VASCULAR CATHETERIZATION Left 12/22/2014   Procedure: PORTA CATH INSERTION;  Surgeon: Robert Bellow, MD;  Location: [facility] ORS;  Service: General;  Laterality: Left;  .  PORTACATH PLACEMENT Right 12/22/2014   Procedure: INSERTION PORT-A-CATH;  Surgeon: Robert Bellow, MD;  Location: [facility] ORS;  Service: General;  Laterality: Right;  . SALPINGOOPHORECTOMY Bilateral 12/22/2014   Procedure: SALPINGO OOPHORECTOMY;  Surgeon: Gillis Ends, MD;  Location: [facility] ORS;  Service: Gynecology;  Laterality: Bilateral;  . UPPER GI ENDOSCOPY  09/25/04   hiatus hernia,  schatzki ring and a single gastric polyp    FAMILY HISTORY :   Family History  Problem Relation Age of Onset  . Cancer Mother        breast, throat, and stomach  . Breast cancer Mother   . Heart disease Father   . Pulmonary embolism Sister   . Cancer Brother 60       angiosarcoma of the chest; carcinoid small intestinal tumor  . Hypertension Brother   . Cancer Maternal Aunt        breast cancer  . Cancer Maternal Grandfather 20       pancreatic    SOCIAL HISTORY:   Social History  Substance Use Topics  . Smoking status: Never Smoker  . Smokeless tobacco: Never Used  . Alcohol use No    ALLERGIES:  is allergic to carafate [sucralfate] and sulfa antibiotics.  MEDICATIONS:  Current Outpatient Prescriptions  Medication Sig Dispense Refill  . acetaminophen (TYLENOL) 500 MG chewable tablet Chew 500 mg by mouth every 6 (six) hours as needed for pain.    . Cholecalciferol (VITAMIN D) 2000 units tablet Take 1 tablet (2,000 Units total) by mouth daily. 30 tablet 12  . Docosanol (ABREVA EX) Apply 1 application topically as needed. Fever blisters    . lactulose (CHRONULAC) 10 GM/15ML solution TAKE 15MLS (10G TOTAL) BY MOUTH THREE TIMES A DAY AS DIRECTED BY PHYSICIAN. 240 mL 1  . levothyroxine (SYNTHROID, LEVOTHROID) 50 MCG tablet Take 1 tablet (50 mcg total) by mouth daily. 30 tablet 12  . lidocaine-prilocaine (EMLA) cream Apply to port area 30-45 mins prior to chemo. 30 g 5  . LORazepam (ATIVAN) 0.5 MG tablet Take 0.5 tablets (0.25 mg total) by mouth at bedtime. 30 tablet 4  . Lysine 500 MG TABS Take 1 tablet by mouth daily.    . Multiple Vitamin (MULTIVITAMIN) capsule Take 1 capsule by mouth daily.    . pantoprazole (PROTONIX) 40 MG tablet Take 1 tablet (40 mg total) by mouth daily. 30 tablet 12  . polyethylene glycol powder (GLYCOLAX/MIRALAX) powder TAKE 17GM (LINE INSIDE CAP) IN EIGHT OUNCES OF WATER OR OTHER LIQUID DAILY 255 g 2  . prochlorperazine (COMPAZINE) 10 MG tablet Take 1  tablet (10 mg total) by mouth every 6 (six) hours as needed for nausea or vomiting. 30 tablet 0  . propafenone (RYTHMOL) 225 MG tablet Take 225 mg by mouth 2 (two) times daily.    . traMADol (ULTRAM) 50 MG tablet Take 1 tablet (50 mg total) by mouth every 6 (six) hours as needed. 30 tablet 0  . ondansetron (ZOFRAN) 4 MG tablet Take 1 tablet (4 mg total) by mouth every 8 (eight) hours as needed for nausea or vomiting. (Patient not taking: Reported on 03/04/2017) 30 tablet 3   No current facility-administered medications for this visit.    Facility-Administered Medications Ordered in Other Visits  Medication Dose Route Frequency Provider Last Rate Last Dose  . sodium chloride 0.9 % injection 10 mL  10 mL Intracatheter PRN Forest Gleason, MD   10 mL at 02/07/15 0930    PHYSICAL EXAMINATION: ECOG PERFORMANCE STATUS: 0 -  Asymptomatic  BP 118/68   Pulse 86   Temp (!) 96.6 F (35.9 C) (Tympanic)   Resp 16   Wt 191 lb 6.4 oz (86.8 kg)   BMI 33.37 kg/m   Filed Weights   03/04/17 0955  Weight: 191 lb 6.4 oz (86.8 kg)    GENERAL: Well-nourished well-developed; Alert, no distress and comfortAccompanied by her husband. EYES: no pallor or icterus OROPHARYNX: no thrush or ulceration; good dentition  NECK: supple, no masses felt LYMPH:  no palpable lymphadenopathy in the cervical, axillary or inguinal regions LUNGS: clear to auscultation and  No wheeze or crackles HEART/CVS: regular rate & rhythm and no murmurs; No lower extremity edema ABDOMEN:abdomen soft, non-tender and normal bowel sounds Musculoskeletal:no cyanosis of digits and no clubbing  PSYCH: alert & oriented x 3 with fluent speech NEURO: no focal motor/sensory deficits SKIN:  Normal. No rash noted.   LABORATORY DATA:  I have reviewed the data as listed    Component Value Date/Time   NA 137 03/04/2017 0936   NA 140 12/14/2014 1037   K 4.1 03/04/2017 0936   K 4.4 12/14/2014 1037   CL 106 03/04/2017 0936   CL 106 12/14/2014  1037   CO2 27 03/04/2017 0936   CO2 26 12/14/2014 1037   GLUCOSE 112 (H) 03/04/2017 0936   GLUCOSE 87 12/14/2014 1037   BUN 12 03/04/2017 0936   BUN 12 12/14/2014 1037   CREATININE 0.91 03/04/2017 0936   CREATININE 0.97 12/14/2014 1037   CALCIUM 9.3 03/04/2017 0936   CALCIUM 9.2 12/14/2014 1037   PROT 6.2 (L) 03/04/2017 0936   PROT 7.3 12/14/2014 1037   ALBUMIN 3.3 (L) 03/04/2017 0936   ALBUMIN 3.8 12/14/2014 1037   AST 20 03/04/2017 0936   AST 20 12/14/2014 1037   ALT 16 03/04/2017 0936   ALT 11 (L) 12/14/2014 1037   ALKPHOS 57 03/04/2017 0936   ALKPHOS 138 (H) 12/14/2014 1037   BILITOT 0.5 03/04/2017 0936   BILITOT 0.5 12/14/2014 1037   GFRNONAA >60 03/04/2017 0936   GFRNONAA 59 (L) 12/14/2014 1037   GFRAA >60 03/04/2017 0936   GFRAA >60 12/14/2014 1037    No results found for: SPEP, UPEP  Lab Results  Component Value Date   WBC 4.0 03/04/2017   NEUTROABS 1.6 03/04/2017   HGB 12.0 03/04/2017   HCT 34.6 (L) 03/04/2017   MCV 95.9 03/04/2017   PLT 250 03/04/2017      Chemistry      Component Value Date/Time   NA 137 03/04/2017 0936   NA 140 12/14/2014 1037   K 4.1 03/04/2017 0936   K 4.4 12/14/2014 1037   CL 106 03/04/2017 0936   CL 106 12/14/2014 1037   CO2 27 03/04/2017 0936   CO2 26 12/14/2014 1037   BUN 12 03/04/2017 0936   BUN 12 12/14/2014 1037   CREATININE 0.91 03/04/2017 0936   CREATININE 0.97 12/14/2014 1037   GLU 84 03/16/2014      Component Value Date/Time   CALCIUM 9.3 03/04/2017 0936   CALCIUM 9.2 12/14/2014 1037   ALKPHOS 57 03/04/2017 0936   ALKPHOS 138 (H) 12/14/2014 1037   AST 20 03/04/2017 0936   AST 20 12/14/2014 1037   ALT 16 03/04/2017 0936   ALT 11 (L) 12/14/2014 1037   BILITOT 0.5 03/04/2017 0936   BILITOT 0.5 12/14/2014 1037     IMPRESSION: 1. Today's study demonstrates a positive response to therapy, as evidenced by regression of the previously noted peritoneal  implant in the low anatomic pelvis. No new sites of  metastatic disease are noted elsewhere in the abdomen or pelvis. 2. Stable appearance of lesions in the liver which are most compatible with cavernous hemangiomas. 3. Colonic diverticulosis without evidence of acute diverticulitis. 4. Aortic atherosclerosis. 5. Small hiatal hernia.  Electronically Signed   By: Vinnie Langton M.D.   On: 12/21/2016 14:06   RADIOGRAPHIC STUDIES: I have personally reviewed the radiological images as listed and agreed with the findings in the report. No results found.   ASSESSMENT & PLAN:  Ovarian cancer, unspecified laterality ([place]) Recurrent  Platinum refractory ovarian cancer [dec 2017] slight increase in the CA-125.May 4th CT- improved/ resolved- pelvic peritoneal nodularity; no new nodularity noted. Ca-125 improving; exam [with Dr.Secord Improved]; On Taxol- Avastin [day1,8 & 15 every 28 days [off-day 22]; Avastin every 2 weeks- tolerating well; currently status post 6; day -8. .  # Proceed with cycle #6; day-15 Labs today reviewed;  acceptable for treatment today. Will finish a total of 6 cycles of Taxol Avastin; and then continue Avastin maintenance.  # Mucositis- G-2; salt/baking soda rinses;improved.   # nasal stuffiness- flonase recommended.   # folow up in 4 weeks/labs- ca-125/CT scan few days prior; Avastin; UA.    Orders Placed This Encounter  Procedures  . CT ABDOMEN PELVIS W CONTRAST    Standing Status:   Future    Standing Expiration Date:   06/03/2018    Order Specific Question:   Reason for Exam (SYMPTOM  OR DIAGNOSIS REQUIRED)    Answer:   ovarian cancer    Order Specific Question:   Preferred imaging location?    Answer:   [facility]  . CBC with Differential    Standing Status:   Future    Standing Expiration Date:   03/04/2018  . Comprehensive metabolic panel    Standing Status:   Future    Standing Expiration Date:   03/04/2018  . CA 125    Standing Status:   Future    Standing Expiration Date:   03/04/2018  .  Urinalysis, Complete w Microscopic    Standing Status:   Future    Standing Expiration Date:   03/04/2018   All questions were answered. The patient knows to call the clinic with any problems, questions or concerns.      Cammie Sickle, MD 03/05/2017 8:05 AM

## 2017-03-06 ENCOUNTER — Inpatient Hospital Stay (HOSPITAL_BASED_OUTPATIENT_CLINIC_OR_DEPARTMENT_OTHER): Payer: PPO | Admitting: Obstetrics and Gynecology

## 2017-03-06 VITALS — BP 124/78 | HR 71 | Temp 97.7°F | Resp 18 | Ht 63.5 in | Wt 191.6 lb

## 2017-03-06 DIAGNOSIS — I10 Essential (primary) hypertension: Secondary | ICD-10-CM

## 2017-03-06 DIAGNOSIS — I4891 Unspecified atrial fibrillation: Secondary | ICD-10-CM | POA: Diagnosis not present

## 2017-03-06 DIAGNOSIS — I493 Ventricular premature depolarization: Secondary | ICD-10-CM | POA: Diagnosis not present

## 2017-03-06 DIAGNOSIS — Z90710 Acquired absence of both cervix and uterus: Secondary | ICD-10-CM

## 2017-03-06 DIAGNOSIS — C569 Malignant neoplasm of unspecified ovary: Secondary | ICD-10-CM | POA: Diagnosis not present

## 2017-03-06 DIAGNOSIS — E039 Hypothyroidism, unspecified: Secondary | ICD-10-CM | POA: Diagnosis not present

## 2017-03-06 DIAGNOSIS — I340 Nonrheumatic mitral (valve) insufficiency: Secondary | ICD-10-CM | POA: Diagnosis not present

## 2017-03-06 DIAGNOSIS — C786 Secondary malignant neoplasm of retroperitoneum and peritoneum: Secondary | ICD-10-CM

## 2017-03-06 DIAGNOSIS — Z90722 Acquired absence of ovaries, bilateral: Secondary | ICD-10-CM

## 2017-03-06 DIAGNOSIS — Z79899 Other long term (current) drug therapy: Secondary | ICD-10-CM

## 2017-03-06 DIAGNOSIS — K219 Gastro-esophageal reflux disease without esophagitis: Secondary | ICD-10-CM

## 2017-03-06 DIAGNOSIS — Z5111 Encounter for antineoplastic chemotherapy: Secondary | ICD-10-CM | POA: Diagnosis not present

## 2017-03-06 DIAGNOSIS — E785 Hyperlipidemia, unspecified: Secondary | ICD-10-CM

## 2017-03-06 NOTE — Progress Notes (Signed)
Her only concern is constipation . I advised her to take the lactulose and miralax as scheduled but if she develops loose stool she can hold until it resolves and that during treatment it could cause more constipation so she may need to take all doses prescribed.  Every person is different and she might have to trial and error to figure out how many doses work for her. I did advise Dr. Theora Gianotti about her concern

## 2017-03-06 NOTE — Progress Notes (Signed)
  Oncology Nurse Navigator Documentation Chaperoned pelvic exam.  Navigator Location: [facility] (03/06/17 1100)   )Navigator Encounter Type: Follow-up Appt (03/06/17 1100)                                                    Time Spent with Patient: 15 (03/06/17 1100)

## 2017-03-06 NOTE — Progress Notes (Signed)
Gynecologic Oncology Interval Visit   Referring Provider: Blain Pais, MD.  Chief Concern: Platinum-resistant recurrent stage IIIc ovarian cancer  Subjective:  Jamie Conner is a 76 y.o. female who is seen for platinum-resistant recurrent stage IIIc ovarian cancer.   She was recently seen by Dr. Rogue Bussing and has received 6 cycles of paclitaxel and bevacizumab 03/04/2017. She is tolerating therapy well except for voice changes and epitaxis. She plans to continue maintenance bevacizumab. She presents today for a pelvic exam.  Lab Results  Component Value Date   CA125 12.8 01/21/2017   CA125 12.6 11/26/2016   CA125 13.0 11/05/2016     Gynecologic Oncology History Jamie Conner is a pleasant female who is seen for postoperative visit for stage IIIc high-grade serous ovarian cancer. See prior notes for complete detail.  She also has a history of  carcinoma of breast ( left) status post lumpectomy , radiation therapy and 5 years of tamoxifen. She underwent exploratory Laparotomy, bilateral salpingo-oophorectomy, right ureterolysis, infragastric omentectomy, and optimally debulked to no gross residual  May 4th, 2016. Preop CA125 2354.0.  She started chemotherapy with carboplatinum and Taxol in dose dense fashion from Jan 02, 2015. She underwent 6 cycles of chemotherapy completed 05/09/2015. Her dose of Taxol was reduced because of myelosuppression and fever in spite of Neulasta.  Nadir CA125 = 9.8  06/06/2015 CT scan abdomen and pelvis IMPRESSION: 1. Interval removal of the large right ovarian mass . Dramatic improvement in the appearance of mesenteric and omental implants. There is a 3 mm potential faint omental nodule at about the level of the umbilicus but for the most part the numerous prior omental and mesenteric tumor implants have resolved completely. 2. 1.8 by 1.2 cm fluid density structure along the splenic hilum, no change from prior, likely a small benign cystic  lesion.  3. Other imaging findings of potential clinical significance: 3 cm periampullary duodenal diverticulum.   01/04/16 CA125 34.1  01/10/2016 CT scan chest abdomen and pelvis. 1. New nodular density 12 x 7 mm ties are seen throughout the pelvis. The largest measures 12 x 10 mm best seen on image number 105 of series. A 9 mm nodule is seen in the left pelvis best seen on image number 60 of series 5. These are concerning for possible peritoneal implants. 2. Stable 1.9 cm cystic lesion seen in splenic hilum.  Vaginal biopsy performed on 01/04/2016 negative  She was started on PLD and carboplatin   05/11/2016 CT scan abdomen and pelvis IMPRESSION: 1. No acute findings identified within the abdomen or pelvis. 2. Peritoneal nodules within the right side of pelvis are stable the slightly increased in size from previous exam. No new areas of disease identified.  She has completed 6 cycles of  PLD and carboplatin therapy and imaging revealed progressive disease.   08/15/2016 CT scan abdomen and pelvis 1. Study demonstrates slight interval growth of several peritoneal implants in the low anatomic pelvis indicative of slight progression of disease. No other new peritoneal deposits are noted, and there is no other metastatic disease noted elsewhere in the chest, abdomen or pelvis. 2. Colonic diverticulosis without evidence of acute diverticulitis at this time. 3. Aortic atherosclerosis, in addition to left main coronary artery disease. Assessment for potential risk factor modification, dietary therapy or pharmacologic therapy may be warranted, if clinically indicated. 4. Additional incidental findings, as above.  Rucaparib denied by insurance.   She was started on paclitaxel and bevacizumab on 09/24/2016.   CA125 02/20/2016 47.5 03/21/2016  31.1 05/16/2016 37.5 06/13/2016 44.8 06/27/2016 50.4 08/15/2016 64.5 09/24/2016 101.7 10/22/2016 18.2 11/05/2016 13. 0 11/26/2016 12.6 01/21/2017  12.8    Genetic testing: ATM mutation [id number].  HRD testing negative ([facility])  I spoke with Dr. Lynnette Caffey at [facility] and he recommended genetic counseling for the patient and possibly other family members. He provided a phone number for them to call to schedule that appointment. The number is [phone]. Of note her daughter has tested positive for an ATM mutation carrier and she requested recommendations for who to see at [facility].    Problem List: Patient Active Problem List   Diagnosis Date Noted  . Counseling regarding goals of care 11/26/2016  . Encounter for monitoring cardiotoxic drug therapy 02/02/2016  . Breast cancer in female [facility]) 12/13/2015  . Peptic ulcer disease 12/12/2015  . DDD (degenerative disc disease), lumbosacral 12/12/2015  . Hyperlipidemia 12/12/2015  . Acute anxiety 12/12/2015  . Insomnia 12/12/2015  . Allergic rhinitis 12/12/2015  . GERD (gastroesophageal reflux disease) 12/12/2015  . Internal hemorrhoid 12/12/2015  . OA (osteoarthritis) 12/12/2015  . Osteopenia 12/12/2015  . Hypothyroid 04/11/2015  . Benign essential HTN 02/01/2015  . Retroperitoneal fibrosis   . Combined fat and carbohydrate induced hyperlipemia 01/04/2015  . Awareness of heartbeats 01/04/2015  . Beat, premature ventricular 01/04/2015  . Malignant neoplasm of ovary (HCC) 12/16/2014  . MI (mitral incompetence) 09/02/2014  . AF (paroxysmal atrial fibrillation) (HCC) 09/02/2014    Past Medical History: Past Medical History:  Diagnosis Date  . Atrial fibrillation (HCC)   . Cancer of breast (HCC) 1998   Left  . CINV (chemotherapy-induced nausea and vomiting)   . GERD (gastroesophageal reflux disease)   . Hyperlipidemia   . Hypothyroidism   . Mitral insufficiency   . Ovarian cancer [facility])    s/p BSO optimal tumor debulking May 2016  . PVC (premature ventricular contraction)     Past Surgical History: Past Surgical History:  Procedure Laterality Date  . ABDOMINAL  HYSTERECTOMY    . BILATERAL SALPINGOOPHORECTOMY  May 2016   with optimal tumor debulking   . BREAST SURGERY     Left  . CESAREAN SECTION     x2  . CHOLECYSTECTOMY    . COLONOSCOPY  2006  . DEBULKING N/A 12/22/2014   Procedure: DEBULKING;  Surgeon: Artelia Laroche, MD;  Location: [facility] ORS;  Service: Gynecology;  Laterality: N/A;  . LAPAROTOMY N/A 12/22/2014   Procedure: EXPLORATORY LAPAROTOMY;  Surgeon: Earline Mayotte, MD;  Location: [facility] ORS;  Service: General;  Laterality: N/A;  . LAPAROTOMY WITH STAGING N/A 12/22/2014   Procedure: LAPAROTOMY WITH STAGING;  Surgeon: Artelia Laroche, MD;  Location: [facility] ORS;  Service: Gynecology;  Laterality: N/A;  . PERIPHERAL VASCULAR CATHETERIZATION Left 12/22/2014   Procedure: PORTA CATH INSERTION;  Surgeon: Earline Mayotte, MD;  Location: [facility] ORS;  Service: General;  Laterality: Left;  . PORTACATH PLACEMENT Right 12/22/2014   Procedure: INSERTION PORT-A-CATH;  Surgeon: Earline Mayotte, MD;  Location: [facility] ORS;  Service: General;  Laterality: Right;  . SALPINGOOPHORECTOMY Bilateral 12/22/2014   Procedure: SALPINGO OOPHORECTOMY;  Surgeon: Artelia Laroche, MD;  Location: [facility] ORS;  Service: Gynecology;  Laterality: Bilateral;  . UPPER GI ENDOSCOPY  09/25/04   hiatus hernia, schatzki ring and a single gastric polyp    Past Gynecologic History:  See HPI  OB History:  OB History  Gravida Para Term Preterm AB Living  2         2  SAB  TAB Ectopic Multiple Live Births               # Outcome Date GA Lbr Len/2nd Weight Sex Delivery Anes PTL Lv  2 Gravida           1 Gravida             Obstetric Comments  1st Menstrual Cycle:  14  1st Pregnancy:  64    Family History: Family History  Problem Relation Age of Onset  . Cancer Mother        breast, throat, and stomach  . Breast cancer Mother   . Heart disease Father   . Pulmonary embolism Sister   . Cancer Brother 60       angiosarcoma of the chest; carcinoid small  intestinal tumor  . Hypertension Brother   . Cancer Maternal Aunt        breast cancer  . Cancer Maternal Grandfather 83       pancreatic    Social History: Social History   Social History  . Marital status: Married    Spouse name: N/A  . Number of children: N/A  . Years of education: N/A   Occupational History  . Not on file.   Social History Main Topics  . Smoking status: Never Smoker  . Smokeless tobacco: Never Used  . Alcohol use No  . Drug use: No  . Sexual activity: Not Currently   Other Topics Concern  . Not on file   Social History Narrative  . No narrative on file    Allergies: Allergies  Allergen Reactions  . Carafate [Sucralfate] Rash  . Sulfa Antibiotics Rash    Current Medications: Current Outpatient Prescriptions  Medication Sig Dispense Refill  . acetaminophen (TYLENOL) 500 MG chewable tablet Chew 500 mg by mouth every 6 (six) hours as needed for pain.    . Cholecalciferol (VITAMIN D) 2000 units tablet Take 1 tablet (2,000 Units total) by mouth daily. 30 tablet 12  . Docosanol (ABREVA EX) Apply 1 application topically as needed. Fever blisters    . lactulose (CHRONULAC) 10 GM/15ML solution TAKE 15MLS (10G TOTAL) BY MOUTH THREE TIMES A DAY AS DIRECTED BY PHYSICIAN. 240 mL 1  . levothyroxine (SYNTHROID, LEVOTHROID) 50 MCG tablet Take 1 tablet (50 mcg total) by mouth daily. 30 tablet 12  . lidocaine-prilocaine (EMLA) cream Apply to port area 30-45 mins prior to chemo. 30 g 5  . LORazepam (ATIVAN) 0.5 MG tablet Take 0.5 tablets (0.25 mg total) by mouth at bedtime. 30 tablet 4  . Lysine 500 MG TABS Take 1 tablet by mouth daily.    . Multiple Vitamin (MULTIVITAMIN) capsule Take 1 capsule by mouth daily.    . pantoprazole (PROTONIX) 40 MG tablet Take 1 tablet (40 mg total) by mouth daily. 30 tablet 12  . polyethylene glycol powder (GLYCOLAX/MIRALAX) powder TAKE 17GM (LINE INSIDE CAP) IN EIGHT OUNCES OF WATER OR OTHER LIQUID DAILY 255 g 2  . propafenone  (RYTHMOL) 225 MG tablet Take 225 mg by mouth 2 (two) times daily.    . ondansetron (ZOFRAN) 4 MG tablet Take 1 tablet (4 mg total) by mouth every 8 (eight) hours as needed for nausea or vomiting. (Patient not taking: Reported on 03/04/2017) 30 tablet 3  . prochlorperazine (COMPAZINE) 10 MG tablet Take 1 tablet (10 mg total) by mouth every 6 (six) hours as needed for nausea or vomiting. (Patient not taking: Reported on 03/06/2017) 30 tablet 0  . traMADol (ULTRAM) 50  MG tablet Take 1 tablet (50 mg total) by mouth every 6 (six) hours as needed. (Patient not taking: Reported on 03/06/2017) 30 tablet 0   No current facility-administered medications for this visit.    Facility-Administered Medications Ordered in Other Visits  Medication Dose Route Frequency Provider Last Rate Last Dose  . sodium chloride 0.9 % injection 10 mL  10 mL Intracatheter PRN Forest Gleason, MD   10 mL at 02/07/15 0930    Review of Systems General: fatigue and weakness  HEENT: voice changes and epitaxis  Lungs: negative for SOB or cough  Cardiac: no complaints  GI: abdominal bloating, constipation  GU: no complaints  Musculoskeletal: no complaints  Extremities: no complaints  Skin: no complaints  Neuro: peripheral neuropathy  Endocrine: no complaints  Psych: no complaints       Objective:  Physical Examination:  BP 124/78   Pulse 71   Temp 97.7 F (36.5 C) (Tympanic)   Resp 18   Ht 5' 3.5" (1.613 m)   Wt 191 lb 9.6 oz (86.9 kg)   BMI 33.41 kg/m . Weight is stable.    ECOG Performance Status: 0 - Asymptomatic  Physical Exam  Constitutional: She appears well-developed and well-nourished.  HENT:  Head: Normocephalic and atraumatic.  Eyes: Conjunctivae are normal.  Abdominal: Soft. Bowel sounds are normal. She exhibits no distension and no mass. No hernia.  Musculoskeletal: She exhibits no edema.  Neurological: She is alert. No cranial nerve deficit.  Skin: Skin is warm and dry.  Psychiatric: She has a  normal mood and affect. Her behavior is normal.    Pelvic exam; Chaperoned by nurse: Pelvic: Vulva: normal appearing vulva with no masses, tenderness or lesions; Vagina: normal left paravaginal cyst approximately mid portion of the vagina, on BME the left paravaginal cyst was stable and benign appearing; BME: negative for masses or nodularity beyond the vaginal cuff; Uterus and Cervix: surgically absent; Rectal: confirms. The previously palpated 1.5 cm nodule near right vaginal fornix (on 10/10/2016) was no longer appreciated  Lab Review Lab Results  Component Value Date   CA125 12.8 01/21/2017   CA125 12.6 11/26/2016   CA125 13.0 11/05/2016    Radiologic Imaging: none    Assessment:  Jamie Conner is a 76 y.o. female diagnosed with optimally debulked stage IIIc  high-grade serous ovarian cancer s/p chemotherapy with complete response. Recurrent ovarian cancer diagnosed 01/10/2016.  ATM mutation positive. HRD negative. Extensive family history of cancer (breast, pancreatic, throat, gastric, adenosarcoma of the chest, carcinoid tumor of the small bowel) in several first degree relatives.  Vaginal lesion, granulation tissue.  Recurrent platinum-sensitive ovarian cancer with stable disease on PLD/carboplatin based on RECIST criteria with ultimate progression. Platinum-resistant disease initiated on paclitaxel and bevacizumab with evidence of response based on exam and CA125. Tolerating therapy adequately.   Plan:   Problem List Items Addressed This Visit      Genitourinary   Malignant neoplasm of ovary ([place]) - Primary (Chronic)     Continue current therapy with Dr. Rogue Bussing. We will continue to follow closely with repeat exam in 3 months.   Gillis Ends, MD    CC:  Blain Pais, MD.

## 2017-03-28 ENCOUNTER — Ambulatory Visit
Admission: RE | Admit: 2017-03-28 | Discharge: 2017-03-28 | Disposition: A | Discharge status: Home / Self Care | Payer: PPO | Source: Ambulatory Visit | Attending: Internal Medicine | Admitting: Internal Medicine

## 2017-03-28 DIAGNOSIS — N899 Noninflammatory disorder of vagina, unspecified: Secondary | ICD-10-CM | POA: Diagnosis not present

## 2017-03-28 DIAGNOSIS — D1803 Hemangioma of intra-abdominal structures: Secondary | ICD-10-CM | POA: Diagnosis not present

## 2017-03-28 DIAGNOSIS — Z90710 Acquired absence of both cervix and uterus: Secondary | ICD-10-CM | POA: Insufficient documentation

## 2017-03-28 DIAGNOSIS — C569 Malignant neoplasm of unspecified ovary: Secondary | ICD-10-CM | POA: Diagnosis not present

## 2017-03-28 DIAGNOSIS — K449 Diaphragmatic hernia without obstruction or gangrene: Secondary | ICD-10-CM | POA: Diagnosis not present

## 2017-03-28 MED ORDER — IOPAMIDOL (ISOVUE-300) INJECTION 61%
100.0000 mL | Freq: Once | INTRAVENOUS | Status: AC | PRN
  Administered 2017-03-28: 100 mL via INTRAVENOUS

## 2017-04-01 ENCOUNTER — Inpatient Hospital Stay: Payer: PPO

## 2017-04-01 ENCOUNTER — Inpatient Hospital Stay (HOSPITAL_BASED_OUTPATIENT_CLINIC_OR_DEPARTMENT_OTHER): Payer: PPO | Admitting: Internal Medicine

## 2017-04-01 ENCOUNTER — Inpatient Hospital Stay: Payer: PPO | Attending: Internal Medicine

## 2017-04-01 DIAGNOSIS — C569 Malignant neoplasm of unspecified ovary: Secondary | ICD-10-CM

## 2017-04-01 DIAGNOSIS — Z800 Family history of malignant neoplasm of digestive organs: Secondary | ICD-10-CM

## 2017-04-01 DIAGNOSIS — M25552 Pain in left hip: Secondary | ICD-10-CM

## 2017-04-01 DIAGNOSIS — Z803 Family history of malignant neoplasm of breast: Secondary | ICD-10-CM

## 2017-04-01 DIAGNOSIS — C786 Secondary malignant neoplasm of retroperitoneum and peritoneum: Secondary | ICD-10-CM | POA: Insufficient documentation

## 2017-04-01 DIAGNOSIS — I739 Peripheral vascular disease, unspecified: Secondary | ICD-10-CM | POA: Insufficient documentation

## 2017-04-01 DIAGNOSIS — E785 Hyperlipidemia, unspecified: Secondary | ICD-10-CM | POA: Insufficient documentation

## 2017-04-01 DIAGNOSIS — Z79899 Other long term (current) drug therapy: Secondary | ICD-10-CM | POA: Insufficient documentation

## 2017-04-01 DIAGNOSIS — D1803 Hemangioma of intra-abdominal structures: Secondary | ICD-10-CM | POA: Insufficient documentation

## 2017-04-01 DIAGNOSIS — I4891 Unspecified atrial fibrillation: Secondary | ICD-10-CM

## 2017-04-01 DIAGNOSIS — I38 Endocarditis, valve unspecified: Secondary | ICD-10-CM | POA: Insufficient documentation

## 2017-04-01 DIAGNOSIS — K449 Diaphragmatic hernia without obstruction or gangrene: Secondary | ICD-10-CM | POA: Insufficient documentation

## 2017-04-01 DIAGNOSIS — Z853 Personal history of malignant neoplasm of breast: Secondary | ICD-10-CM | POA: Diagnosis not present

## 2017-04-01 DIAGNOSIS — E039 Hypothyroidism, unspecified: Secondary | ICD-10-CM | POA: Insufficient documentation

## 2017-04-01 DIAGNOSIS — Z923 Personal history of irradiation: Secondary | ICD-10-CM | POA: Diagnosis not present

## 2017-04-01 DIAGNOSIS — K219 Gastro-esophageal reflux disease without esophagitis: Secondary | ICD-10-CM | POA: Insufficient documentation

## 2017-04-01 DIAGNOSIS — Z5112 Encounter for antineoplastic immunotherapy: Secondary | ICD-10-CM | POA: Insufficient documentation

## 2017-04-01 LAB — URINALYSIS, COMPLETE (UACMP) WITH MICROSCOPIC
BILIRUBIN URINE: NEGATIVE
Bacteria, UA: NONE SEEN
GLUCOSE, UA: NEGATIVE mg/dL
Hgb urine dipstick: NEGATIVE
KETONES UR: NEGATIVE mg/dL
LEUKOCYTES UA: NEGATIVE
Nitrite: NEGATIVE
PH: 5 (ref 5.0–8.0)
PROTEIN: NEGATIVE mg/dL
RBC / HPF: NONE SEEN RBC/hpf (ref 0–5)
Specific Gravity, Urine: 1.008 (ref 1.005–1.030)

## 2017-04-01 LAB — CBC WITH DIFFERENTIAL/PLATELET
BASOS ABS: 0 10*3/uL (ref 0–0.1)
BASOS PCT: 1 %
EOS ABS: 0.2 10*3/uL (ref 0–0.7)
EOS PCT: 5 %
HEMATOCRIT: 38.3 % (ref 35.0–47.0)
Hemoglobin: 13.1 g/dL (ref 12.0–16.0)
Lymphocytes Relative: 53 %
Lymphs Abs: 2.4 10*3/uL (ref 1.0–3.6)
MCH: 32.6 pg (ref 26.0–34.0)
MCHC: 34.2 g/dL (ref 32.0–36.0)
MCV: 95.3 fL (ref 80.0–100.0)
MONO ABS: 0.6 10*3/uL (ref 0.2–0.9)
MONOS PCT: 14 %
Neutro Abs: 1.3 10*3/uL — ABNORMAL LOW (ref 1.4–6.5)
Neutrophils Relative %: 27 %
PLATELETS: 262 10*3/uL (ref 150–440)
RBC: 4.02 MIL/uL (ref 3.80–5.20)
RDW: 17.8 % — AB (ref 11.5–14.5)
WBC: 4.6 10*3/uL (ref 3.6–11.0)

## 2017-04-01 LAB — COMPREHENSIVE METABOLIC PANEL
ALBUMIN: 3.4 g/dL — AB (ref 3.5–5.0)
ALT: 13 U/L — ABNORMAL LOW (ref 14–54)
ANION GAP: 7 (ref 5–15)
AST: 25 U/L (ref 15–41)
Alkaline Phosphatase: 56 U/L (ref 38–126)
BILIRUBIN TOTAL: 0.6 mg/dL (ref 0.3–1.2)
BUN: 14 mg/dL (ref 6–20)
CHLORIDE: 106 mmol/L (ref 101–111)
CO2: 25 mmol/L (ref 22–32)
Calcium: 9.3 mg/dL (ref 8.9–10.3)
Creatinine, Ser: 0.94 mg/dL (ref 0.44–1.00)
GFR calc Af Amer: >60 mL/min (ref >60)
GFR calc non Af Amer: 59 mL/min — ABNORMAL LOW (ref >60)
GLUCOSE: 96 mg/dL (ref 65–99)
POTASSIUM: 3.9 mmol/L (ref 3.5–5.1)
Sodium: 138 mmol/L (ref 135–145)
TOTAL PROTEIN: 6.7 g/dL (ref 6.5–8.1)

## 2017-04-01 MED ORDER — HEPARIN SOD (PORK) LOCK FLUSH 100 UNIT/ML IV SOLN
500.0000 [IU] | Freq: Once | INTRAVENOUS | Status: AC | PRN
  Administered 2017-04-01: 500 [IU]
  Filled 2017-04-01: qty 5

## 2017-04-01 MED ORDER — SODIUM CHLORIDE 0.9% FLUSH
10.0000 mL | INTRAVENOUS | Status: DC | PRN
  Filled 2017-04-01: qty 10

## 2017-04-01 MED ORDER — SODIUM CHLORIDE 0.9 % IV SOLN
Freq: Once | INTRAVENOUS | Status: AC
  Administered 2017-04-01: 10:00:00 via INTRAVENOUS
  Filled 2017-04-01: qty 1000

## 2017-04-01 MED ORDER — SODIUM CHLORIDE 0.9 % IV SOLN
15.0000 mg/kg | Freq: Once | INTRAVENOUS | Status: AC
  Administered 2017-04-01: 1300 mg via INTRAVENOUS
  Filled 2017-04-01: qty 48

## 2017-04-01 NOTE — Progress Notes (Signed)
DISCONTINUE ON PATHWAY REGIMEN - Ovarian  Other Clinical Trial: avastin  REASON: Other Reason PRIOR TREATMENT: Other Trial - avastin  START ON PATHWAY REGIMEN - Ovarian  Other Clinical Trial: avastin  Patient Characteristics: Recurrent or Progressive Disease, Maintenance Therapy AJCC T Category: T3a AJCC N Category: NX AJCC M Category: M1 Therapeutic Status: Recurrent or Progressive Disease AJCC 8 Stage Grouping: IV Line of Therapy: Maintenance BRCA Mutation Status: Absent Would you be surprised if this patient died  in the next year? I would be surprised if this patient died in the next year Intent of Therapy: Non-Curative / Palliative Intent, Discussed with Patient

## 2017-04-01 NOTE — Progress Notes (Signed)
[place] OFFICE PROGRESS NOTE  Patient Care Team: Jerrol Banana., MD as PCP - General (Family Medicine) Gillis Ends, MD as Referring Physician (Obstetrics and Gynecology) Bary Castilla Forest Gleason, MD as Consulting Physician (General Surgery)  Ovarian cancer [facility])   Staging form: Ovary, AJCC 7th Edition     Clinical: Stage IIIC (T3c, N0, M0) - Unsigned    Oncology History   # April- MAY 2016- OVARIAN CANCER STAGE IIIC; CA-125 +2300+;  s/p OPTIMAL DEBULKING SURGERY   # MAY 2016[name] [finished in Sep 19th 2017]  # MAY CT 2017- RECURRENT/Peritoneal carcinomatosis/pelvic implant ~1.2cm/Ca-125-34;   # July 3rd- CARBO-DOXIL q 4W [with neulasta]; CT May 11 2016- slight increase peritoneal / stable nodules. Cont chemo [finished DEC 2017]. DEC 28th 2017 CT- Increase in peritoneal deposits; increasing Ca 125 [60s];  # Jan 2018- carbo-Taxol-Avastin x6 cycles; Aug 2018- CR; Aug 2018- start Avastin Maintenance  # G-1-2 hand foot syndrome-   # LEFT BREAST CA s/p Lumpec & RT s/p TAM   # Afib [cardiology]; JUNE 2017- MUGA 62%  MOLECULAR TESTING- ATM genetic mutation; TUMOR BRCA- NEG; Myriad genomic instability- NEGATIVE     Ovarian cancer, unspecified laterality ([place])    INTERVAL HISTORY:  76 year old female patient with above history of ovarian cancer recurrent Platinum Refractory ovarian cancer [dec 2017] Patient is currently on Avastin and Taxol- Cycle #6; if she to proceed with Avastin maintenance.  Patient complains of left hip pain; which she attributes to physical activity. No nausea no vomiting.  Otherwise denies any headaches. Denies any significant tingling and numbness in extremities. Intermittent bloody mucus on blowing the nose. No spontaneous epistaxis.   REVIEW OF SYSTEMS:  A complete 10 point review of system is done which is negative except mentioned above/history of present illness.   PAST MEDICAL HISTORY :  Past Medical  History:  Diagnosis Date  . Atrial fibrillation ([place])   . Cancer of breast ([place]) 1998   Left  . CINV (chemotherapy-induced nausea and vomiting)   . GERD (gastroesophageal reflux disease)   . Hyperlipidemia   . Hypothyroidism   . Mitral insufficiency   . Ovarian cancer [facility])    s/p BSO optimal tumor debulking May 2016  . PVC (premature ventricular contraction)     PAST SURGICAL HISTORY :   Past Surgical History:  Procedure Laterality Date  . ABDOMINAL HYSTERECTOMY    . BILATERAL SALPINGOOPHORECTOMY  May 2016   with optimal tumor debulking   . BREAST SURGERY     Left  . CESAREAN SECTION     x2  . CHOLECYSTECTOMY    . COLONOSCOPY  2006  . DEBULKING N/A 12/22/2014   Procedure: DEBULKING;  Surgeon: Gillis Ends, MD;  Location: [facility] ORS;  Service: Gynecology;  Laterality: N/A;  . LAPAROTOMY N/A 12/22/2014   Procedure: EXPLORATORY LAPAROTOMY;  Surgeon: Robert Bellow, MD;  Location: [facility] ORS;  Service: General;  Laterality: N/A;  . LAPAROTOMY WITH STAGING N/A 12/22/2014   Procedure: LAPAROTOMY WITH STAGING;  Surgeon: Gillis Ends, MD;  Location: [facility] ORS;  Service: Gynecology;  Laterality: N/A;  . PERIPHERAL VASCULAR CATHETERIZATION Left 12/22/2014   Procedure: PORTA CATH INSERTION;  Surgeon: Robert Bellow, MD;  Location: [facility] ORS;  Service: General;  Laterality: Left;  . PORTACATH PLACEMENT Right 12/22/2014   Procedure: INSERTION PORT-A-CATH;  Surgeon: Robert Bellow, MD;  Location: [facility] ORS;  Service: General;  Laterality: Right;  . SALPINGOOPHORECTOMY Bilateral 12/22/2014   Procedure: SALPINGO  OOPHORECTOMY;  Surgeon: Gillis Ends, MD;  Location: [facility] ORS;  Service: Gynecology;  Laterality: Bilateral;  . UPPER GI ENDOSCOPY  09/25/04   hiatus hernia, schatzki ring and a single gastric polyp    FAMILY HISTORY :   Family History  Problem Relation Age of Onset  . Cancer Mother        breast, throat, and stomach  . Breast cancer Mother   . Heart  disease Father   . Pulmonary embolism Sister   . Cancer Brother 60       angiosarcoma of the chest; carcinoid small intestinal tumor  . Hypertension Brother   . Cancer Maternal Aunt        breast cancer  . Cancer Maternal Grandfather 68       pancreatic    SOCIAL HISTORY:   Social History  Substance Use Topics  . Smoking status: Never Smoker  . Smokeless tobacco: Never Used  . Alcohol use No    ALLERGIES:  is allergic to carafate [sucralfate] and sulfa antibiotics.  MEDICATIONS:  Current Outpatient Prescriptions  Medication Sig Dispense Refill  . acetaminophen (TYLENOL) 500 MG chewable tablet Chew 500 mg by mouth every 6 (six) hours as needed for pain.    . Cholecalciferol (VITAMIN D) 2000 units tablet Take 1 tablet (2,000 Units total) by mouth daily. 30 tablet 12  . Docosanol (ABREVA EX) Apply 1 application topically as needed. Fever blisters    . lactulose (CHRONULAC) 10 GM/15ML solution TAKE 15MLS (10G TOTAL) BY MOUTH THREE TIMES A DAY AS DIRECTED BY PHYSICIAN. 240 mL 1  . levothyroxine (SYNTHROID, LEVOTHROID) 50 MCG tablet Take 1 tablet (50 mcg total) by mouth daily. 30 tablet 12  . lidocaine-prilocaine (EMLA) cream Apply to port area 30-45 mins prior to chemo. 30 g 5  . LORazepam (ATIVAN) 0.5 MG tablet Take 0.5 tablets (0.25 mg total) by mouth at bedtime. 30 tablet 4  . Lysine 500 MG TABS Take 1 tablet by mouth daily.    . Multiple Vitamin (MULTIVITAMIN) capsule Take 1 capsule by mouth daily.    . pantoprazole (PROTONIX) 40 MG tablet Take 1 tablet (40 mg total) by mouth daily. 30 tablet 12  . polyethylene glycol powder (GLYCOLAX/MIRALAX) powder TAKE 17GM (LINE INSIDE CAP) IN EIGHT OUNCES OF WATER OR OTHER LIQUID DAILY 255 g 2  . propafenone (RYTHMOL) 225 MG tablet Take 225 mg by mouth 2 (two) times daily.    . ondansetron (ZOFRAN) 4 MG tablet Take 1 tablet (4 mg total) by mouth every 8 (eight) hours as needed for nausea or vomiting. (Patient not taking: Reported on 03/04/2017)  30 tablet 3  . prochlorperazine (COMPAZINE) 10 MG tablet Take 1 tablet (10 mg total) by mouth every 6 (six) hours as needed for nausea or vomiting. (Patient not taking: Reported on 03/06/2017) 30 tablet 0  . traMADol (ULTRAM) 50 MG tablet Take 1 tablet (50 mg total) by mouth every 6 (six) hours as needed. (Patient not taking: Reported on 03/06/2017) 30 tablet 0   No current facility-administered medications for this visit.    Facility-Administered Medications Ordered in Other Visits  Medication Dose Route Frequency Provider Last Rate Last Dose  . sodium chloride 0.9 % injection 10 mL  10 mL Intracatheter PRN Forest Gleason, MD   10 mL at 02/07/15 0930  . sodium chloride flush (NS) 0.9 % injection 10 mL  10 mL Intracatheter PRN Cammie Sickle, MD        PHYSICAL EXAMINATION: ECOG PERFORMANCE  STATUS: 0 - Asymptomatic  BP 119/75 (BP Location: Left Arm, Patient Position: Sitting)   Pulse 84   Temp 97.6 F (36.4 C) (Tympanic)   Resp 16   Wt 192 lb (87.1 kg)   BMI 33.48 kg/m   Filed Weights   04/01/17 0859  Weight: 192 lb (87.1 kg)    GENERAL: Well-nourished well-developed; Alert, no distress and comfortAccompanied by her husband. EYES: no pallor or icterus OROPHARYNX: no thrush or ulceration; good dentition  NECK: supple, no masses felt LYMPH:  no palpable lymphadenopathy in the cervical, axillary or inguinal regions LUNGS: clear to auscultation and  No wheeze or crackles HEART/CVS: regular rate & rhythm and no murmurs; No lower extremity edema ABDOMEN:abdomen soft, non-tender and normal bowel sounds Musculoskeletal:no cyanosis of digits and no clubbing  PSYCH: alert & oriented x 3 with fluent speech NEURO: no focal motor/sensory deficits SKIN:  Normal. No rash noted.   LABORATORY DATA:  I have reviewed the data as listed    Component Value Date/Time   NA 138 04/01/2017 0840   NA 140 12/14/2014 1037   K 3.9 04/01/2017 0840   K 4.4 12/14/2014 1037   CL 106 04/01/2017  0840   CL 106 12/14/2014 1037   CO2 25 04/01/2017 0840   CO2 26 12/14/2014 1037   GLUCOSE 96 04/01/2017 0840   GLUCOSE 87 12/14/2014 1037   BUN 14 04/01/2017 0840   BUN 12 12/14/2014 1037   CREATININE 0.94 04/01/2017 0840   CREATININE 0.97 12/14/2014 1037   CALCIUM 9.3 04/01/2017 0840   CALCIUM 9.2 12/14/2014 1037   PROT 6.7 04/01/2017 0840   PROT 7.3 12/14/2014 1037   ALBUMIN 3.4 (L) 04/01/2017 0840   ALBUMIN 3.8 12/14/2014 1037   AST 25 04/01/2017 0840   AST 20 12/14/2014 1037   ALT 13 (L) 04/01/2017 0840   ALT 11 (L) 12/14/2014 1037   ALKPHOS 56 04/01/2017 0840   ALKPHOS 138 (H) 12/14/2014 1037   BILITOT 0.6 04/01/2017 0840   BILITOT 0.5 12/14/2014 1037   GFRNONAA 59 (L) 04/01/2017 0840   GFRNONAA 59 (L) 12/14/2014 1037   GFRAA >60 04/01/2017 0840   GFRAA >60 12/14/2014 1037    No results found for: SPEP, UPEP  Lab Results  Component Value Date   WBC 4.6 04/01/2017   NEUTROABS 1.3 (L) 04/01/2017   HGB 13.1 04/01/2017   HCT 38.3 04/01/2017   MCV 95.3 04/01/2017   PLT 262 04/01/2017      Chemistry      Component Value Date/Time   NA 138 04/01/2017 0840   NA 140 12/14/2014 1037   K 3.9 04/01/2017 0840   K 4.4 12/14/2014 1037   CL 106 04/01/2017 0840   CL 106 12/14/2014 1037   CO2 25 04/01/2017 0840   CO2 26 12/14/2014 1037   BUN 14 04/01/2017 0840   BUN 12 12/14/2014 1037   CREATININE 0.94 04/01/2017 0840   CREATININE 0.97 12/14/2014 1037   GLU 84 03/16/2014      Component Value Date/Time   CALCIUM 9.3 04/01/2017 0840   CALCIUM 9.2 12/14/2014 1037   ALKPHOS 56 04/01/2017 0840   ALKPHOS 138 (H) 12/14/2014 1037   AST 25 04/01/2017 0840   AST 20 12/14/2014 1037   ALT 13 (L) 04/01/2017 0840   ALT 11 (L) 12/14/2014 1037   BILITOT 0.6 04/01/2017 0840   BILITOT 0.5 12/14/2014 1037     IMPRESSION: 1. Status post hysterectomy, without recurrent or metastatic disease. 2.  Possible  constipation. 3.  Tiny hiatal hernia. 4. Hepatic hemangiomas. 5.  Cystic lesion within the right-side of the vagina is similar over prior exams and may represent a Bartholin's gland cyst.   Electronically Signed   By: Abigail Miyamoto M.D.   On: 03/28/2017 12:09   RADIOGRAPHIC STUDIES: I have personally reviewed the radiological images as listed and agreed with the findings in the report. No results found.   ASSESSMENT & PLAN:  Ovarian cancer, unspecified laterality ([place]) Recurrent  Platinum refractory ovarian cancer [dec 2017] currently status post Taxol Avastin 6 cycles; August 10 CT scan- improvement/resolution of the pelvic peritoneal nodularity. CA-125 improving. August 2018- Gynecological exam with Dr. Theora Gianotti- resolution of the palpable metastatic disease.  # Proceed with maintenance Avastin every 3 weeks. Reviewed the rationale for using Avastin. Discussed the potential side effects including but not limited to elevated blood pressure ; nephrotic syndrome wound healing problems.   # left hip pain- suspect musculoskeletal. Recommend local/topical measures.  # folow up in 3 weeks/Avastin/UA/ labs.   Orders Placed This Encounter  Procedures  . CBC with Differential    Standing Status:   Future    Standing Expiration Date:   04/01/2018  . Comprehensive metabolic panel    Standing Status:   Future    Standing Expiration Date:   04/01/2018  . Urinalysis, Complete w Microscopic    Standing Status:   Future    Standing Expiration Date:   04/01/2018   All questions were answered. The patient knows to call the clinic with any problems, questions or concerns.      Cammie Sickle, MD 04/01/2017 1:35 PM

## 2017-04-01 NOTE — Progress Notes (Signed)
DISCONTINUE ON PATHWAY REGIMEN - Ovarian     A cycle is every 28 days:     Paclitaxel        Dose Mod: None     Bevacizumab        Dose Mod: None  **Always confirm dose/schedule in your pharmacy ordering system**    REASON: Continuation Of Treatment PRIOR TREATMENT: OVOS97: Paclitaxel 80 mg/m2 Weekly + Bevacizumab 10 mg/kg q2 Weeks, q28 Days; Re-evaluate Every 3 Cycles, Treat until Complete Response, Unacceptable Toxicity, or Disease Progression TREATMENT RESPONSE: Complete Response (CR)  START ON PATHWAY REGIMEN - Ovarian  Other Clinical Trial: avastin  Patient Characteristics: Recurrent or Progressive Disease, Maintenance Therapy AJCC T Category: T3a AJCC N Category: NX AJCC M Category: M1 Therapeutic Status: Recurrent or Progressive Disease AJCC 8 Stage Grouping: IV Line of Therapy: Maintenance BRCA Mutation Status: Absent Would you be surprised if this patient died  in the next year? I would be surprised if this patient died in the next year Intent of Therapy: Non-Curative / Palliative Intent, Discussed with Patient

## 2017-04-01 NOTE — Assessment & Plan Note (Addendum)
Recurrent  Platinum refractory ovarian cancer [dec 2017] currently status post Taxol Avastin 6 cycles; August 10 CT scan- improvement/resolution of the pelvic peritoneal nodularity. CA-125 improving. August 2018- Gynecological exam with Dr. Theora Gianotti- resolution of the palpable metastatic disease.  # Proceed with maintenance Avastin every 3 weeks. Reviewed the rationale for using Avastin. Discussed the potential side effects including but not limited to elevated blood pressure ; nephrotic syndrome wound healing problems.   # left hip pain- suspect musculoskeletal. Recommend local/topical measures.  # folow up in 3 weeks/Avastin/UA/ labs.

## 2017-04-02 LAB — CA 125: CANCER ANTIGEN (CA) 125: 14.9 U/mL (ref 0.0–38.1)

## 2017-04-24 ENCOUNTER — Ambulatory Visit
Admission: RE | Admit: 2017-04-24 | Discharge: 2017-04-24 | Disposition: A | Discharge status: Home / Self Care | Payer: PPO | Source: Ambulatory Visit | Attending: Internal Medicine | Admitting: Internal Medicine

## 2017-04-24 ENCOUNTER — Inpatient Hospital Stay: Payer: PPO

## 2017-04-24 ENCOUNTER — Inpatient Hospital Stay (HOSPITAL_BASED_OUTPATIENT_CLINIC_OR_DEPARTMENT_OTHER): Payer: PPO | Admitting: Internal Medicine

## 2017-04-24 ENCOUNTER — Inpatient Hospital Stay: Payer: PPO | Attending: Internal Medicine

## 2017-04-24 VITALS — BP 102/70 | HR 80 | Temp 97.6°F | Resp 20 | Ht 63.5 in | Wt 191.5 lb

## 2017-04-24 DIAGNOSIS — H538 Other visual disturbances: Secondary | ICD-10-CM | POA: Insufficient documentation

## 2017-04-24 DIAGNOSIS — C569 Malignant neoplasm of unspecified ovary: Secondary | ICD-10-CM

## 2017-04-24 DIAGNOSIS — I4891 Unspecified atrial fibrillation: Secondary | ICD-10-CM | POA: Insufficient documentation

## 2017-04-24 DIAGNOSIS — Z79899 Other long term (current) drug therapy: Secondary | ICD-10-CM | POA: Diagnosis not present

## 2017-04-24 DIAGNOSIS — K449 Diaphragmatic hernia without obstruction or gangrene: Secondary | ICD-10-CM | POA: Diagnosis not present

## 2017-04-24 DIAGNOSIS — K219 Gastro-esophageal reflux disease without esophagitis: Secondary | ICD-10-CM | POA: Diagnosis not present

## 2017-04-24 DIAGNOSIS — I340 Nonrheumatic mitral (valve) insufficiency: Secondary | ICD-10-CM

## 2017-04-24 DIAGNOSIS — Z5112 Encounter for antineoplastic immunotherapy: Secondary | ICD-10-CM | POA: Diagnosis not present

## 2017-04-24 DIAGNOSIS — Z803 Family history of malignant neoplasm of breast: Secondary | ICD-10-CM | POA: Diagnosis not present

## 2017-04-24 DIAGNOSIS — E039 Hypothyroidism, unspecified: Secondary | ICD-10-CM | POA: Diagnosis not present

## 2017-04-24 DIAGNOSIS — D1803 Hemangioma of intra-abdominal structures: Secondary | ICD-10-CM

## 2017-04-24 DIAGNOSIS — I493 Ventricular premature depolarization: Secondary | ICD-10-CM

## 2017-04-24 DIAGNOSIS — M25552 Pain in left hip: Secondary | ICD-10-CM | POA: Insufficient documentation

## 2017-04-24 DIAGNOSIS — Z9221 Personal history of antineoplastic chemotherapy: Secondary | ICD-10-CM | POA: Diagnosis not present

## 2017-04-24 DIAGNOSIS — E785 Hyperlipidemia, unspecified: Secondary | ICD-10-CM | POA: Insufficient documentation

## 2017-04-24 DIAGNOSIS — Z800 Family history of malignant neoplasm of digestive organs: Secondary | ICD-10-CM | POA: Diagnosis not present

## 2017-04-24 LAB — COMPREHENSIVE METABOLIC PANEL
ALBUMIN: 3.7 g/dL (ref 3.5–5.0)
ALT: 14 U/L (ref 14–54)
ANION GAP: 9 (ref 5–15)
AST: 23 U/L (ref 15–41)
Alkaline Phosphatase: 55 U/L (ref 38–126)
BILIRUBIN TOTAL: 0.5 mg/dL (ref 0.3–1.2)
BUN: 15 mg/dL (ref 6–20)
CO2: 24 mmol/L (ref 22–32)
Calcium: 9.5 mg/dL (ref 8.9–10.3)
Chloride: 105 mmol/L (ref 101–111)
Creatinine, Ser: 1.07 mg/dL — ABNORMAL HIGH (ref 0.44–1.00)
GFR calc Af Amer: 58 mL/min — ABNORMAL LOW (ref >60)
GFR calc non Af Amer: 50 mL/min — ABNORMAL LOW (ref >60)
GLUCOSE: 100 mg/dL — AB (ref 65–99)
POTASSIUM: 4 mmol/L (ref 3.5–5.1)
SODIUM: 138 mmol/L (ref 135–145)
TOTAL PROTEIN: 6.9 g/dL (ref 6.5–8.1)

## 2017-04-24 LAB — CBC WITH DIFFERENTIAL/PLATELET
BASOS PCT: 1 %
Basophils Absolute: 0 10*3/uL (ref 0–0.1)
EOS ABS: 0.3 10*3/uL (ref 0–0.7)
EOS PCT: 6 %
HCT: 39.7 % (ref 35.0–47.0)
Hemoglobin: 13.7 g/dL (ref 12.0–16.0)
Lymphocytes Relative: 41 %
Lymphs Abs: 2.3 10*3/uL (ref 1.0–3.6)
MCH: 32.6 pg (ref 26.0–34.0)
MCHC: 34.5 g/dL (ref 32.0–36.0)
MCV: 94.3 fL (ref 80.0–100.0)
MONO ABS: 0.6 10*3/uL (ref 0.2–0.9)
MONOS PCT: 11 %
Neutro Abs: 2.3 10*3/uL (ref 1.4–6.5)
Neutrophils Relative %: 41 %
PLATELETS: 256 10*3/uL (ref 150–440)
RBC: 4.22 MIL/uL (ref 3.80–5.20)
RDW: 16.3 % — ABNORMAL HIGH (ref 11.5–14.5)
WBC: 5.7 10*3/uL (ref 3.6–11.0)

## 2017-04-24 LAB — URINALYSIS, COMPLETE (UACMP) WITH MICROSCOPIC
BILIRUBIN URINE: NEGATIVE
Bacteria, UA: NONE SEEN
Glucose, UA: NEGATIVE mg/dL
Hgb urine dipstick: NEGATIVE
KETONES UR: NEGATIVE mg/dL
Leukocytes, UA: NEGATIVE
Nitrite: NEGATIVE
PH: 5 (ref 5.0–8.0)
Protein, ur: NEGATIVE mg/dL
Specific Gravity, Urine: 1.012 (ref 1.005–1.030)

## 2017-04-24 MED ORDER — SODIUM CHLORIDE 0.9 % IV SOLN
15.0000 mg/kg | Freq: Once | INTRAVENOUS | Status: AC
  Administered 2017-04-24: 1300 mg via INTRAVENOUS
  Filled 2017-04-24: qty 48

## 2017-04-24 MED ORDER — SODIUM CHLORIDE 0.9% FLUSH
10.0000 mL | INTRAVENOUS | Status: DC | PRN
  Administered 2017-04-24: 10 mL via INTRAVENOUS
  Filled 2017-04-24: qty 10

## 2017-04-24 MED ORDER — HEPARIN SOD (PORK) LOCK FLUSH 100 UNIT/ML IV SOLN
500.0000 [IU] | Freq: Once | INTRAVENOUS | Status: AC
  Administered 2017-04-24: 500 [IU] via INTRAVENOUS
  Filled 2017-04-24: qty 5

## 2017-04-24 MED ORDER — SODIUM CHLORIDE 0.9 % IV SOLN
Freq: Once | INTRAVENOUS | Status: AC
  Administered 2017-04-24: 10:00:00 via INTRAVENOUS
  Filled 2017-04-24: qty 1000

## 2017-04-24 NOTE — Assessment & Plan Note (Addendum)
Recurrent  Platinum refractory ovarian cancer [dec 2017] August 10 CT scan- improvement/resolution of the pelvic peritoneal nodularity. August 2018- Gynecological exam with Dr. Theora Gianotti- resolution of the palpable metastatic disease. Currently on Avastin maintenance.  No concerns for clinical progression- Ca 125-slightly rising.   # Continue with maintenance Avastin every 3 weeks.  Tolerating well.    # left hip pain-worsening; advil/tylenol. Not helping.  Recommend local/topical measures. Check X-ray left hip. I do not suspect this is progression of disease. Clinically not DVT; if not better then [place] of legs. Pt will let us know.   # Blurry vision- okay with opthal evaluation.  # folow up in 3 weeks/Avastin/UA/ labs.  Addendum: X-ray left hip within normal limits if continued symptoms would recommend ultrasound of the legs.

## 2017-04-24 NOTE — Progress Notes (Signed)
Patient here for follow-up for h/o ovarian cancer. She has no medical complaints.

## 2017-04-24 NOTE — Progress Notes (Signed)
[place] OFFICE PROGRESS NOTE  Patient Care Team: Jerrol Banana., MD as PCP - General (Family Medicine) Gillis Ends, MD as Referring Physician (Obstetrics and Gynecology) Bary Castilla Forest Gleason, MD as Consulting Physician (General Surgery)  Ovarian cancer [facility])   Staging form: Ovary, AJCC 7th Edition     Clinical: Stage IIIC (T3c, N0, M0) - Unsigned    Oncology History   # April- MAY 2016- OVARIAN CANCER STAGE IIIC; CA-125 +2300+;  s/p OPTIMAL DEBULKING SURGERY   # MAY 2016[name] [finished in Sep 19th 2017]  # MAY CT 2017- RECURRENT/Peritoneal carcinomatosis/pelvic implant ~1.2cm/Ca-125-34;   # July 3rd- CARBO-DOXIL q 4W [with neulasta]; CT May 11 2016- slight increase peritoneal / stable nodules. Cont chemo [finished DEC 2017]. DEC 28th 2017 CT- Increase in peritoneal deposits; increasing Ca 125 [60s];  # Jan 2018- carbo-Taxol-Avastin x6 cycles; Aug 2018- CR; Aug 2018- start Avastin Maintenance  # G-1-2 hand foot syndrome-   # LEFT BREAST CA s/p Lumpec & RT s/p TAM   # Afib [cardiology]; JUNE 2017- MUGA 62%  MOLECULAR TESTING- ATM genetic mutation; TUMOR BRCA- NEG; Myriad genomic instability- NEGATIVE     Ovarian cancer, unspecified laterality ([place])    INTERVAL HISTORY:  76 year old female patient with above history of ovarian cancer recurrent Platinum Refractory ovarian cancer [dec 2017] Currently on maintenance Avastin is here for follow-up.  Patient notes to have worsening left hip pain over the last 2-3 weeks. Worse with activity. Radiates down the leg. This has not improved Tylenol. She has been taking Motrin only as needed. Patient has intermittent blurry vision.  Otherwise denies any headaches. Denies any significant tingling and numbness in extremities. Intermittent bloody mucus on blowing the nose. No spontaneous epistaxis.   REVIEW OF SYSTEMS:  A complete 10 point review of system is done which is negative except  mentioned above/history of present illness.   PAST MEDICAL HISTORY :  Past Medical History:  Diagnosis Date  . Atrial fibrillation ([place])   . Cancer of breast ([place]) 1998   Left  . CINV (chemotherapy-induced nausea and vomiting)   . GERD (gastroesophageal reflux disease)   . Hyperlipidemia   . Hypothyroidism   . Mitral insufficiency   . Ovarian cancer [facility])    s/p BSO optimal tumor debulking May 2016  . PVC (premature ventricular contraction)     PAST SURGICAL HISTORY :   Past Surgical History:  Procedure Laterality Date  . ABDOMINAL HYSTERECTOMY    . BILATERAL SALPINGOOPHORECTOMY  May 2016   with optimal tumor debulking   . BREAST SURGERY     Left  . CESAREAN SECTION     x2  . CHOLECYSTECTOMY    . COLONOSCOPY  2006  . DEBULKING N/A 12/22/2014   Procedure: DEBULKING;  Surgeon: Gillis Ends, MD;  Location: [facility] ORS;  Service: Gynecology;  Laterality: N/A;  . LAPAROTOMY N/A 12/22/2014   Procedure: EXPLORATORY LAPAROTOMY;  Surgeon: Robert Bellow, MD;  Location: [facility] ORS;  Service: General;  Laterality: N/A;  . LAPAROTOMY WITH STAGING N/A 12/22/2014   Procedure: LAPAROTOMY WITH STAGING;  Surgeon: Gillis Ends, MD;  Location: [facility] ORS;  Service: Gynecology;  Laterality: N/A;  . PERIPHERAL VASCULAR CATHETERIZATION Left 12/22/2014   Procedure: PORTA CATH INSERTION;  Surgeon: Robert Bellow, MD;  Location: [facility] ORS;  Service: General;  Laterality: Left;  . PORTACATH PLACEMENT Right 12/22/2014   Procedure: INSERTION PORT-A-CATH;  Surgeon: Robert Bellow, MD;  Location: [facility] ORS;  Service: General;  Laterality: Right;  . SALPINGOOPHORECTOMY Bilateral 12/22/2014   Procedure: SALPINGO OOPHORECTOMY;  Surgeon: Gillis Ends, MD;  Location: [facility] ORS;  Service: Gynecology;  Laterality: Bilateral;  . UPPER GI ENDOSCOPY  09/25/04   hiatus hernia, schatzki ring and a single gastric polyp    FAMILY HISTORY :   Family History  Problem Relation Age of Onset  . Cancer  Mother        breast, throat, and stomach  . Breast cancer Mother   . Heart disease Father   . Pulmonary embolism Sister   . Cancer Brother 60       angiosarcoma of the chest; carcinoid small intestinal tumor  . Hypertension Brother   . Cancer Maternal Aunt        breast cancer  . Cancer Maternal Grandfather 65       pancreatic    SOCIAL HISTORY:   Social History  Substance Use Topics  . Smoking status: Never Smoker  . Smokeless tobacco: Never Used  . Alcohol use No    ALLERGIES:  is allergic to carafate [sucralfate] and sulfa antibiotics.  MEDICATIONS:  Current Outpatient Prescriptions  Medication Sig Dispense Refill  . acetaminophen (TYLENOL) 500 MG chewable tablet Chew 500 mg by mouth every 6 (six) hours as needed for pain.    . Cholecalciferol (VITAMIN D) 2000 units tablet Take 1 tablet (2,000 Units total) by mouth daily. 30 tablet 12  . levothyroxine (SYNTHROID, LEVOTHROID) 50 MCG tablet Take 1 tablet (50 mcg total) by mouth daily. 30 tablet 12  . lidocaine-prilocaine (EMLA) cream Apply to port area 30-45 mins prior to chemo. 30 g 5  . LORazepam (ATIVAN) 0.5 MG tablet Take 0.5 tablets (0.25 mg total) by mouth at bedtime. 30 tablet 4  . pantoprazole (PROTONIX) 40 MG tablet Take 1 tablet (40 mg total) by mouth daily. 30 tablet 12  . polyethylene glycol powder (GLYCOLAX/MIRALAX) powder TAKE 17GM (LINE INSIDE CAP) IN EIGHT OUNCES OF WATER OR OTHER LIQUID DAILY 255 g 2  . propafenone (RYTHMOL) 225 MG tablet Take 225 mg by mouth 2 (two) times daily.    . Docosanol (ABREVA EX) Apply 1 application topically as needed. Fever blisters    . lactulose (CHRONULAC) 10 GM/15ML solution TAKE 15MLS (10G TOTAL) BY MOUTH THREE TIMES A DAY AS DIRECTED BY PHYSICIAN. (Patient not taking: Reported on 04/24/2017) 240 mL 1  . Lysine 500 MG TABS Take 1 tablet by mouth daily.    . Multiple Vitamin (MULTIVITAMIN) capsule Take 1 capsule by mouth daily.    . ondansetron (ZOFRAN) 4 MG tablet Take 1 tablet  (4 mg total) by mouth every 8 (eight) hours as needed for nausea or vomiting. (Patient not taking: Reported on 03/04/2017) 30 tablet 3  . prochlorperazine (COMPAZINE) 10 MG tablet Take 1 tablet (10 mg total) by mouth every 6 (six) hours as needed for nausea or vomiting. (Patient not taking: Reported on 03/06/2017) 30 tablet 0  . traMADol (ULTRAM) 50 MG tablet Take 1 tablet (50 mg total) by mouth every 6 (six) hours as needed. (Patient not taking: Reported on 04/24/2017) 30 tablet 0   No current facility-administered medications for this visit.    Facility-Administered Medications Ordered in Other Visits  Medication Dose Route Frequency Provider Last Rate Last Dose  . sodium chloride 0.9 % injection 10 mL  10 mL Intracatheter PRN Forest Gleason, MD   10 mL at 02/07/15 0930    PHYSICAL EXAMINATION: ECOG PERFORMANCE STATUS: 0 - Asymptomatic  BP 102/70 (Patient Position: Sitting)   Pulse 80   Temp 97.6 F (36.4 C) (Tympanic)   Resp 20   Ht 5' 3.5" (1.613 m)   Wt 191 lb 8 oz (86.9 kg)   BMI 33.39 kg/m   Filed Weights   04/24/17 0939  Weight: 191 lb 8 oz (86.9 kg)    GENERAL: Well-nourished well-developed; Alert, no distress and comfortAccompanied by her husband. EYES: no pallor or icterus OROPHARYNX: no thrush or ulceration; good dentition  NECK: supple, no masses felt LYMPH:  no palpable lymphadenopathy in the cervical, axillary or inguinal regions LUNGS: clear to auscultation and  No wheeze or crackles HEART/CVS: regular rate & rhythm and no murmurs; No lower extremity edema ABDOMEN:abdomen soft, non-tender and normal bowel sounds Musculoskeletal:no cyanosis of digits and no clubbing  PSYCH: alert & oriented x 3 with fluent speech NEURO: no focal motor/sensory deficits SKIN:  Normal. No rash noted.   LABORATORY DATA:  I have reviewed the data as listed    Component Value Date/Time   NA 138 04/24/2017 0922   NA 140 12/14/2014 1037   K 4.0 04/24/2017 0922   K 4.4 12/14/2014  1037   CL 105 04/24/2017 0922   CL 106 12/14/2014 1037   CO2 24 04/24/2017 0922   CO2 26 12/14/2014 1037   GLUCOSE 100 (H) 04/24/2017 0922   GLUCOSE 87 12/14/2014 1037   BUN 15 04/24/2017 0922   BUN 12 12/14/2014 1037   CREATININE 1.07 (H) 04/24/2017 0922   CREATININE 0.97 12/14/2014 1037   CALCIUM 9.5 04/24/2017 0922   CALCIUM 9.2 12/14/2014 1037   PROT 6.9 04/24/2017 0922   PROT 7.3 12/14/2014 1037   ALBUMIN 3.7 04/24/2017 0922   ALBUMIN 3.8 12/14/2014 1037   AST 23 04/24/2017 0922   AST 20 12/14/2014 1037   ALT 14 04/24/2017 0922   ALT 11 (L) 12/14/2014 1037   ALKPHOS 55 04/24/2017 0922   ALKPHOS 138 (H) 12/14/2014 1037   BILITOT 0.5 04/24/2017 0922   BILITOT 0.5 12/14/2014 1037   GFRNONAA 50 (L) 04/24/2017 0922   GFRNONAA 59 (L) 12/14/2014 1037   GFRAA 58 (L) 04/24/2017 0922   GFRAA >60 12/14/2014 1037    No results found for: SPEP, UPEP  Lab Results  Component Value Date   WBC 5.7 04/24/2017   NEUTROABS 2.3 04/24/2017   HGB 13.7 04/24/2017   HCT 39.7 04/24/2017   MCV 94.3 04/24/2017   PLT 256 04/24/2017      Chemistry      Component Value Date/Time   NA 138 04/24/2017 0922   NA 140 12/14/2014 1037   K 4.0 04/24/2017 0922   K 4.4 12/14/2014 1037   CL 105 04/24/2017 0922   CL 106 12/14/2014 1037   CO2 24 04/24/2017 0922   CO2 26 12/14/2014 1037   BUN 15 04/24/2017 0922   BUN 12 12/14/2014 1037   CREATININE 1.07 (H) 04/24/2017 0922   CREATININE 0.97 12/14/2014 1037   GLU 84 03/16/2014      Component Value Date/Time   CALCIUM 9.5 04/24/2017 0922   CALCIUM 9.2 12/14/2014 1037   ALKPHOS 55 04/24/2017 0922   ALKPHOS 138 (H) 12/14/2014 1037   AST 23 04/24/2017 0922   AST 20 12/14/2014 1037   ALT 14 04/24/2017 0922   ALT 11 (L) 12/14/2014 1037   BILITOT 0.5 04/24/2017 0922   BILITOT 0.5 12/14/2014 1037     IMPRESSION: 1. Status post hysterectomy, without recurrent or metastatic disease. 2.  Possible constipation. 3.  Tiny hiatal hernia. 4.  Hepatic hemangiomas. 5. Cystic lesion within the right-side of the vagina is similar over prior exams and may represent a Bartholin's gland cyst.   Electronically Signed   By: Abigail Miyamoto M.D.   On: 03/28/2017 12:09 Results for ZAHRIA, DING (MRN [id number]) as of 04/24/2017 09:53  Ref. Range 12/08/2014 14:59 03/14/2015 15:13 06/08/2015 11:54 07/20/2015 11:43 09/13/2015 10:30 12/14/2015 10:02 01/04/2016 13:19 02/20/2016 09:58 03/21/2016 08:57 05/16/2016 11:14 06/13/2016 09:51 06/27/2016 09:50 08/15/2016 08:55 09/24/2016 09:03 10/22/2016 08:26 11/05/2016 08:28 11/26/2016 08:28 01/21/2017 09:13 02/25/2017 08:19 04/01/2017 08:40  CA 125 Latest Ref Range: 0.0 - 38.1 U/mL 2,354.0 (H) 9.8 14.9 14.5 17.8 24.4 34.1 47.5 (H) 31.1 37.5 44.8 (H) 50.4 (H) 64.5 (H) 101.7 (H) 18.2 13.0 12.6 12.8    Cancer Antigen (CA) 125 Latest Ref Range: 0.0 - 38.1 U/mL                   13.3 14.9    RADIOGRAPHIC STUDIES: I have personally reviewed the radiological images as listed and agreed with the findings in the report. Dg Hip Unilat W Or W/o Pelvis 2-3 Views Left  Result Date: 04/24/2017 CLINICAL DATA:  Ovarian cancer.  Left hip pain EXAM: DG HIP (WITH OR WITHOUT PELVIS) 2-3V LEFT COMPARISON:  None. FINDINGS: There is no evidence of hip fracture or dislocation. There is no evidence of arthropathy or other focal bone abnormality. IMPRESSION: Negative. Electronically Signed   By: Franchot Gallo M.D.   On: 04/24/2017 13:23     ASSESSMENT & PLAN:  Ovarian cancer, unspecified laterality ([place]) Recurrent  Platinum refractory ovarian cancer [dec 2017] August 10 CT scan- improvement/resolution of the pelvic peritoneal nodularity. August 2018- Gynecological exam with Dr. Theora Gianotti- resolution of the palpable metastatic disease. Currently on Avastin maintenance.  No concerns for clinical progression- Ca 125-slightly rising.   # Continue with maintenance Avastin every 3 weeks.  Tolerating well.    # left hip pain-worsening; advil/tylenol. Not  helping.  Recommend local/topical measures. Check X-ray left hip. I do not suspect this is progression of disease. Clinically not DVT; if not better then [place] of legs. Pt will let us know.   # Blurry vision- okay with opthal evaluation.  # folow up in 3 weeks/Avastin/UA/ labs.  Addendum: X-ray left hip within normal limits if continued symptoms would recommend ultrasound of the legs.   Orders Placed This Encounter  Procedures  . DG HIP UNILAT W OR W/O PELVIS 2-3 VIEWS LEFT    Standing Status:   Future    Number of Occurrences:   1    Standing Expiration Date:   06/24/2018    Order Specific Question:   Reason for Exam (SYMPTOM  OR DIAGNOSIS REQUIRED)    Answer:   left hip pain    Order Specific Question:   Preferred imaging location?    Answer:   [facility]  . CBC with Differential/Platelet    Standing Status:   Future    Standing Expiration Date:   04/24/2018  . Comprehensive metabolic panel    Standing Status:   Future    Standing Expiration Date:   04/24/2018  . CA 125    Standing Status:   Future    Standing Expiration Date:   04/24/2018  . Urinalysis, Complete w Microscopic    Standing Status:   Future    Standing Expiration Date:   04/24/2018   All questions were answered. The patient knows to call  the clinic with any problems, questions or concerns.      Cammie Sickle, MD 04/24/2017 6:54 PM

## 2017-04-26 ENCOUNTER — Telehealth: Payer: Self-pay | Admitting: *Deleted

## 2017-04-26 NOTE — Telephone Encounter (Signed)
Contacted patient. She states her pain in the hip has improved but has generalized stiffness when sitting for long periods of time. Pt declined an u/s at this time. She will call us back if symptoms worsen.

## 2017-04-26 NOTE — Telephone Encounter (Signed)
-----   Message from Cammie Sickle, MD sent at 04/24/2017  4:14 PM EDT ----- Please inform patient that x-rays negative however if her hip pain gets worse to let us know; would recommend Ultrasound of leg to rule out blood clot.

## 2017-05-13 ENCOUNTER — Inpatient Hospital Stay: Payer: PPO

## 2017-05-13 ENCOUNTER — Inpatient Hospital Stay (HOSPITAL_BASED_OUTPATIENT_CLINIC_OR_DEPARTMENT_OTHER): Payer: PPO | Admitting: Internal Medicine

## 2017-05-13 VITALS — BP 116/74 | Temp 98.1°F | Wt 190.4 lb

## 2017-05-13 DIAGNOSIS — I493 Ventricular premature depolarization: Secondary | ICD-10-CM

## 2017-05-13 DIAGNOSIS — Z79899 Other long term (current) drug therapy: Secondary | ICD-10-CM

## 2017-05-13 DIAGNOSIS — Z5112 Encounter for antineoplastic immunotherapy: Secondary | ICD-10-CM | POA: Diagnosis not present

## 2017-05-13 DIAGNOSIS — E039 Hypothyroidism, unspecified: Secondary | ICD-10-CM

## 2017-05-13 DIAGNOSIS — H538 Other visual disturbances: Secondary | ICD-10-CM | POA: Diagnosis not present

## 2017-05-13 DIAGNOSIS — C569 Malignant neoplasm of unspecified ovary: Secondary | ICD-10-CM

## 2017-05-13 DIAGNOSIS — Z803 Family history of malignant neoplasm of breast: Secondary | ICD-10-CM

## 2017-05-13 DIAGNOSIS — D1803 Hemangioma of intra-abdominal structures: Secondary | ICD-10-CM | POA: Diagnosis not present

## 2017-05-13 DIAGNOSIS — M25552 Pain in left hip: Secondary | ICD-10-CM | POA: Diagnosis not present

## 2017-05-13 DIAGNOSIS — I4891 Unspecified atrial fibrillation: Secondary | ICD-10-CM | POA: Diagnosis not present

## 2017-05-13 DIAGNOSIS — I340 Nonrheumatic mitral (valve) insufficiency: Secondary | ICD-10-CM

## 2017-05-13 DIAGNOSIS — Z800 Family history of malignant neoplasm of digestive organs: Secondary | ICD-10-CM

## 2017-05-13 DIAGNOSIS — Z9221 Personal history of antineoplastic chemotherapy: Secondary | ICD-10-CM

## 2017-05-13 DIAGNOSIS — K219 Gastro-esophageal reflux disease without esophagitis: Secondary | ICD-10-CM | POA: Diagnosis not present

## 2017-05-13 DIAGNOSIS — E785 Hyperlipidemia, unspecified: Secondary | ICD-10-CM

## 2017-05-13 DIAGNOSIS — K449 Diaphragmatic hernia without obstruction or gangrene: Secondary | ICD-10-CM | POA: Diagnosis not present

## 2017-05-13 LAB — URINALYSIS, COMPLETE (UACMP) WITH MICROSCOPIC
BILIRUBIN URINE: NEGATIVE
Bacteria, UA: NONE SEEN
Glucose, UA: NEGATIVE mg/dL
HGB URINE DIPSTICK: NEGATIVE
KETONES UR: NEGATIVE mg/dL
LEUKOCYTES UA: NEGATIVE
NITRITE: NEGATIVE
PH: 6 (ref 5.0–8.0)
Protein, ur: NEGATIVE mg/dL
RBC / HPF: NONE SEEN RBC/hpf (ref 0–5)
SPECIFIC GRAVITY, URINE: 1.01 (ref 1.005–1.030)

## 2017-05-13 LAB — CBC WITH DIFFERENTIAL/PLATELET
BASOS PCT: 1 %
Basophils Absolute: 0 10*3/uL (ref 0–0.1)
EOS ABS: 0.3 10*3/uL (ref 0–0.7)
EOS PCT: 6 %
HCT: 39.7 % (ref 35.0–47.0)
HEMOGLOBIN: 13.7 g/dL (ref 12.0–16.0)
LYMPHS ABS: 2.2 10*3/uL (ref 1.0–3.6)
Lymphocytes Relative: 49 %
MCH: 32.4 pg (ref 26.0–34.0)
MCHC: 34.6 g/dL (ref 32.0–36.0)
MCV: 93.8 fL (ref 80.0–100.0)
MONO ABS: 0.5 10*3/uL (ref 0.2–0.9)
MONOS PCT: 10 %
Neutro Abs: 1.5 10*3/uL (ref 1.4–6.5)
Neutrophils Relative %: 34 %
Platelets: 232 10*3/uL (ref 150–440)
RBC: 4.23 MIL/uL (ref 3.80–5.20)
RDW: 15.5 % — AB (ref 11.5–14.5)
WBC: 4.5 10*3/uL (ref 3.6–11.0)

## 2017-05-13 LAB — COMPREHENSIVE METABOLIC PANEL
ALBUMIN: 3.5 g/dL (ref 3.5–5.0)
ALK PHOS: 56 U/L (ref 38–126)
ALT: 13 U/L — ABNORMAL LOW (ref 14–54)
AST: 24 U/L (ref 15–41)
Anion gap: 9 (ref 5–15)
BUN: 14 mg/dL (ref 6–20)
CALCIUM: 9.4 mg/dL (ref 8.9–10.3)
CO2: 24 mmol/L (ref 22–32)
Chloride: 104 mmol/L (ref 101–111)
Creatinine, Ser: 1.05 mg/dL — ABNORMAL HIGH (ref 0.44–1.00)
GFR calc non Af Amer: 51 mL/min — ABNORMAL LOW (ref >60)
GFR, EST AFRICAN AMERICAN: 60 mL/min — AB (ref >60)
GLUCOSE: 101 mg/dL — AB (ref 65–99)
POTASSIUM: 3.9 mmol/L (ref 3.5–5.1)
SODIUM: 137 mmol/L (ref 135–145)
TOTAL PROTEIN: 6.6 g/dL (ref 6.5–8.1)
Total Bilirubin: 0.6 mg/dL (ref 0.3–1.2)

## 2017-05-13 MED ORDER — SODIUM CHLORIDE 0.9 % IV SOLN
Freq: Once | INTRAVENOUS | Status: AC
  Administered 2017-05-13: 11:00:00 via INTRAVENOUS
  Filled 2017-05-13: qty 1000

## 2017-05-13 MED ORDER — HEPARIN SOD (PORK) LOCK FLUSH 100 UNIT/ML IV SOLN
500.0000 [IU] | Freq: Once | INTRAVENOUS | Status: AC
  Administered 2017-05-13: 500 [IU] via INTRAVENOUS

## 2017-05-13 MED ORDER — SODIUM CHLORIDE 0.9% FLUSH
10.0000 mL | INTRAVENOUS | Status: DC | PRN
  Administered 2017-05-13: 10 mL via INTRAVENOUS
  Filled 2017-05-13: qty 10

## 2017-05-13 MED ORDER — SODIUM CHLORIDE 0.9 % IV SOLN
15.0000 mg/kg | Freq: Once | INTRAVENOUS | Status: AC
  Administered 2017-05-13: 1300 mg via INTRAVENOUS
  Filled 2017-05-13: qty 48

## 2017-05-13 NOTE — Progress Notes (Signed)
[place] OFFICE PROGRESS NOTE  Patient Care Team: Jerrol Banana., MD as PCP - General (Family Medicine) Gillis Ends, MD as Referring Physician (Obstetrics and Gynecology) Bary Castilla Forest Gleason, MD as Consulting Physician (General Surgery)  Ovarian cancer [facility])   Staging form: Ovary, AJCC 7th Edition     Clinical: Stage IIIC (T3c, N0, M0) - Unsigned    Oncology History   # April- MAY 2016- OVARIAN CANCER STAGE IIIC; CA-125 +2300+;  s/p OPTIMAL DEBULKING SURGERY   # MAY 2016[name] [finished in Sep 19th 2017]  # MAY CT 2017- RECURRENT/Peritoneal carcinomatosis/pelvic implant ~1.2cm/Ca-125-34;   # July 3rd- CARBO-DOXIL q 4W [with neulasta]; CT May 11 2016- slight increase peritoneal / stable nodules. Cont chemo [finished DEC 2017]. DEC 28th 2017 CT- Increase in peritoneal deposits; increasing Ca 125 [60s];  # Jan 2018- carbo-Taxol-Avastin x6 cycles; Aug 2018- CR; Aug 2018- start Avastin Maintenance  # G-1-2 hand foot syndrome-   # LEFT BREAST CA s/p Lumpec & RT s/p TAM   # Afib [cardiology]; JUNE 2017- MUGA 62%  MOLECULAR TESTING- ATM genetic mutation; TUMOR BRCA- NEG; Myriad genomic instability- NEGATIVE     Ovarian cancer, unspecified laterality ([place])    INTERVAL HISTORY:  76 year old female patient with above history of ovarian cancer recurrent Platinum Refractory ovarian cancer [dec 2017] Currently on maintenance Avastin is here for follow-up.   She last saw Dr. Theora Gianotti 03/06/17 whose exam showed resolution of the previously palpable vaginal mass. CA 125 has trended down this past year and began slowly (April 12.6)  trending back up in July 13.3 and August to 14.9. Urinalysis has been consistently negative for proteinurea. No issues with hypertension. Denies delayed wound healing, bloody or black stools. Denies headaches, significant numbness or tingling in extremities. No spontaneous epistaxis but reports intermittent bloody mucus  with nose blowing.   Patient had previously reported constipation which she says is well maintained with the use of Miralax.   Previously patient had left hip pain that was worse with activity and radiated down leg. Xray was negative. Had previously discussed consideration of ultrasound for evaluation of blood clot. Today patient reports mildly improved pain. She localizes pain to her tailbone with radiation to her hip and wrapping around to the front of her leg. Reports pain is similar to previous episodes of back pain she has had in the past. No leg swelling, redness, temperature change.    REVIEW OF SYSTEMS:  A complete 10 point review of system is done which is negative except mentioned above/history of present illness.   PAST MEDICAL HISTORY :  Past Medical History:  Diagnosis Date  . Atrial fibrillation ([place])   . Cancer of breast ([place]) 1998   Left  . CINV (chemotherapy-induced nausea and vomiting)   . GERD (gastroesophageal reflux disease)   . Hyperlipidemia   . Hypothyroidism   . Mitral insufficiency   . Ovarian cancer [facility])    s/p BSO optimal tumor debulking May 2016  . PVC (premature ventricular contraction)     PAST SURGICAL HISTORY :   Past Surgical History:  Procedure Laterality Date  . ABDOMINAL HYSTERECTOMY    . BILATERAL SALPINGOOPHORECTOMY  May 2016   with optimal tumor debulking   . BREAST SURGERY     Left  . CESAREAN SECTION     x2  . CHOLECYSTECTOMY    . COLONOSCOPY  2006  . DEBULKING N/A 12/22/2014   Procedure: DEBULKING;  Surgeon: Gillis Ends, MD;  Location: [facility] ORS;  Service: Gynecology;  Laterality: N/A;  . LAPAROTOMY N/A 12/22/2014   Procedure: EXPLORATORY LAPAROTOMY;  Surgeon: Earline Mayotte, MD;  Location: [facility] ORS;  Service: General;  Laterality: N/A;  . LAPAROTOMY WITH STAGING N/A 12/22/2014   Procedure: LAPAROTOMY WITH STAGING;  Surgeon: Artelia Laroche, MD;  Location: [facility] ORS;  Service: Gynecology;  Laterality: N/A;  .  PERIPHERAL VASCULAR CATHETERIZATION Left 12/22/2014   Procedure: PORTA CATH INSERTION;  Surgeon: Earline Mayotte, MD;  Location: [facility] ORS;  Service: General;  Laterality: Left;  . PORTACATH PLACEMENT Right 12/22/2014   Procedure: INSERTION PORT-A-CATH;  Surgeon: Earline Mayotte, MD;  Location: [facility] ORS;  Service: General;  Laterality: Right;  . SALPINGOOPHORECTOMY Bilateral 12/22/2014   Procedure: SALPINGO OOPHORECTOMY;  Surgeon: Artelia Laroche, MD;  Location: [facility] ORS;  Service: Gynecology;  Laterality: Bilateral;  . UPPER GI ENDOSCOPY  09/25/04   hiatus hernia, schatzki ring and a single gastric polyp    FAMILY HISTORY :   Family History  Problem Relation Age of Onset  . Cancer Mother        breast, throat, and stomach  . Breast cancer Mother   . Heart disease Father   . Pulmonary embolism Sister   . Cancer Brother 60       angiosarcoma of the chest; carcinoid small intestinal tumor  . Hypertension Brother   . Cancer Maternal Aunt        breast cancer  . Cancer Maternal Grandfather 65       pancreatic    SOCIAL HISTORY:   Social History  Substance Use Topics  . Smoking status: Never Smoker  . Smokeless tobacco: Never Used  . Alcohol use No    ALLERGIES:  is allergic to carafate [sucralfate] and sulfa antibiotics.  MEDICATIONS:  Current Outpatient Prescriptions  Medication Sig Dispense Refill  . acetaminophen (TYLENOL) 500 MG chewable tablet Chew 500 mg by mouth every 6 (six) hours as needed for pain.    . Cholecalciferol (VITAMIN D) 2000 units tablet Take 1 tablet (2,000 Units total) by mouth daily. 30 tablet 12  . Docosanol (ABREVA EX) Apply 1 application topically as needed. Fever blisters    . lactulose (CHRONULAC) 10 GM/15ML solution TAKE (10G TOTAL) BY MOUTH THREE TIMES A DAY AS DIRECTED BY PHYSICIAN. 240 mL 1  . levothyroxine (SYNTHROID, LEVOTHROID) 50 MCG tablet Take 1 tablet (50 mcg total) by mouth daily. 30 tablet 12  . lidocaine-prilocaine (EMLA)  cream Apply to port area 30-45 mins prior to chemo. 30 g 5  . LORazepam (ATIVAN) 0.5 MG tablet Take 0.5 tablets (0.25 mg total) by mouth at bedtime. 30 tablet 4  . Lysine 500 MG TABS Take 1 tablet by mouth daily.    . Multiple Vitamin (MULTIVITAMIN) capsule Take 1 capsule by mouth daily.    . ondansetron (ZOFRAN) 4 MG tablet Take 1 tablet (4 mg total) by mouth every 8 (eight) hours as needed for nausea or vomiting. 30 tablet 3  . pantoprazole (PROTONIX) 40 MG tablet Take 1 tablet (40 mg total) by mouth daily. 30 tablet 12  . polyethylene glycol powder (GLYCOLAX/MIRALAX) powder TAKE 17GM (LINE INSIDE CAP) IN EIGHT OUNCES OF WATER OR OTHER LIQUID DAILY 255 g 2  . prochlorperazine (COMPAZINE) 10 MG tablet Take 1 tablet (10 mg total) by mouth every 6 (six) hours as needed for nausea or vomiting. 30 tablet 0  . propafenone (RYTHMOL) 225 MG tablet Take 225 mg by mouth 2 (two)  times daily.    . traMADol (ULTRAM) 50 MG tablet Take 1 tablet (50 mg total) by mouth every 6 (six) hours as needed. 30 tablet 0   No current facility-administered medications for this visit.    Facility-Administered Medications Ordered in Other Visits  Medication Dose Route Frequency Provider Last Rate Last Dose  . heparin lock flush 100 unit/mL  500 Units Intravenous Once Charlaine Dalton R, MD      . sodium chloride 0.9 % injection 10 mL  10 mL Intracatheter PRN Forest Gleason, MD   10 mL at 02/07/15 0930  . sodium chloride flush (NS) 0.9 % injection 10 mL  10 mL Intravenous PRN Cammie Sickle, MD   10 mL at 05/13/17 0903    PHYSICAL EXAMINATION: ECOG PERFORMANCE STATUS: 0 - Asymptomatic  BP 116/74 (BP Location: Left Arm, Patient Position: Sitting)   Temp 98.1 F (36.7 C)   Wt 190 lb 6.4 oz (86.4 kg)   BMI 33.20 kg/m   Filed Weights   05/13/17 0938  Weight: 190 lb 6.4 oz (86.4 kg)    GENERAL: Well-nourished well-developed; Alert, no distress and comfortAccompanied by her husband. EYES: no pallor or  icterus OROPHARYNX: no thrush or ulceration; good dentition  NECK: supple, no masses felt LYMPH:  no palpable lymphadenopathy in the cervical, axillary or inguinal regions.  LUNGS: clear to auscultation and  No wheeze or crackles HEART/CVS: regular rate & rhythm and no murmurs; No lower extremity edema ABDOMEN:abdomen soft, non-tender and normal bowel sounds Musculoskeletal:no cyanosis of digits and no clubbing. Positive straight leg raise on left side. Negative straight leg raise on right. No redness, temperature warm and well perfused.  PSYCH: alert & oriented x 3 with fluent speech NEURO: no focal motor/sensory deficits SKIN:  Normal. No rash noted.   LABORATORY DATA:  I have reviewed the data as listed    Component Value Date/Time   NA 137 05/13/2017 0910   NA 140 12/14/2014 1037   K 3.9 05/13/2017 0910   K 4.4 12/14/2014 1037   CL 104 05/13/2017 0910   CL 106 12/14/2014 1037   CO2 24 05/13/2017 0910   CO2 26 12/14/2014 1037   GLUCOSE 101 (H) 05/13/2017 0910   GLUCOSE 87 12/14/2014 1037   BUN 14 05/13/2017 0910   BUN 12 12/14/2014 1037   CREATININE 1.05 (H) 05/13/2017 0910   CREATININE 0.97 12/14/2014 1037   CALCIUM 9.4 05/13/2017 0910   CALCIUM 9.2 12/14/2014 1037   PROT 6.6 05/13/2017 0910   PROT 7.3 12/14/2014 1037   ALBUMIN 3.5 05/13/2017 0910   ALBUMIN 3.8 12/14/2014 1037   AST 24 05/13/2017 0910   AST 20 12/14/2014 1037   ALT 13 (L) 05/13/2017 0910   ALT 11 (L) 12/14/2014 1037   ALKPHOS 56 05/13/2017 0910   ALKPHOS 138 (H) 12/14/2014 1037   BILITOT 0.6 05/13/2017 0910   BILITOT 0.5 12/14/2014 1037   GFRNONAA 51 (L) 05/13/2017 0910   GFRNONAA 59 (L) 12/14/2014 1037   GFRAA 60 (L) 05/13/2017 0910   GFRAA >60 12/14/2014 1037    No results found for: SPEP, UPEP  Lab Results  Component Value Date   WBC 4.5 05/13/2017   NEUTROABS 1.5 05/13/2017   HGB 13.7 05/13/2017   HCT 39.7 05/13/2017   MCV 93.8 05/13/2017   PLT 232 05/13/2017      Chemistry       Component Value Date/Time   NA 137 05/13/2017 0910   NA 140 12/14/2014 1037  K 3.9 05/13/2017 0910   K 4.4 12/14/2014 1037   CL 104 05/13/2017 0910   CL 106 12/14/2014 1037   CO2 24 05/13/2017 0910   CO2 26 12/14/2014 1037   BUN 14 05/13/2017 0910   BUN 12 12/14/2014 1037   CREATININE 1.05 (H) 05/13/2017 0910   CREATININE 0.97 12/14/2014 1037   GLU 84 03/16/2014      Component Value Date/Time   CALCIUM 9.4 05/13/2017 0910   CALCIUM 9.2 12/14/2014 1037   ALKPHOS 56 05/13/2017 0910   ALKPHOS 138 (H) 12/14/2014 1037   AST 24 05/13/2017 0910   AST 20 12/14/2014 1037   ALT 13 (L) 05/13/2017 0910   ALT 11 (L) 12/14/2014 1037   BILITOT 0.6 05/13/2017 0910   BILITOT 0.5 12/14/2014 1037     IMPRESSION: 1. Status post hysterectomy, without recurrent or metastatic disease. 2.  Possible constipation. 3.  Tiny hiatal hernia. 4. Hepatic hemangiomas.  5. Cystic lesion within the right-side of the vagina is similar over prior exams and may represent a Bartholin's gland cyst.   Electronically Signed   By: Abigail Miyamoto M.D.   On: 03/28/2017 12:09 Results for JAHLIYAH, TRICE (MRN [id number]) as of 04/24/2017 09:53  Ref. Range 12/08/2014 14:59 03/14/2015 15:13 06/08/2015 11:54 07/20/2015 11:43 09/13/2015 10:30 12/14/2015 10:02 01/04/2016 13:19 02/20/2016 09:58 03/21/2016 08:57 05/16/2016 11:14 06/13/2016 09:51 06/27/2016 09:50 08/15/2016 08:55 09/24/2016 09:03 10/22/2016 08:26 11/05/2016 08:28 11/26/2016 08:28 01/21/2017 09:13 02/25/2017 08:19 04/01/2017 08:40  CA 125 Latest Ref Range: 0.0 - 38.1 U/mL 2,354.0 (H) 9.8 14.9 14.5 17.8 24.4 34.1 47.5 (H) 31.1 37.5 44.8 (H) 50.4 (H) 64.5 (H) 101.7 (H) 18.2 13.0 12.6 12.8    Cancer Antigen (CA) 125 Latest Ref Range: 0.0 - 38.1 U/mL                   13.3 14.9    RADIOGRAPHIC STUDIES: I have personally reviewed the radiological images as listed and agreed with the findings in the report. No results found.   ASSESSMENT & PLAN:  Ovarian cancer, unspecified  laterality ([place]) Recurrent  Platinum refractory ovarian cancer [dec 2017] August 10 CT scan- improvement/resolution of the pelvic peritoneal nodularity. August 2018- Gynecological exam with Dr. Theora Gianotti- resolution of the palpable metastatic disease. Currently on Avastin maintenance.  No concerns for clinical progression- Ca 125-slightly rising.   # Proceed with Avastin today- tolerating well.   # left hip pain-worsening; Tylenol as needed for pain not to exceed 3grams per day. May use topical otc creams for pain as well. X-ray of hip negative. No palpable inguinal lymphadenopathy. Positive left side straight leg raise raises suspicion of MSK etiology. Patient not interested in referral to ortho at this time. Can re-consider in future.  # Blurry vision- resolved  # folow up in 3 weeks/Avastin/UA/ labs.   No orders of the defined types were placed in this encounter.  All questions were answered. The patient knows to call the clinic with any problems, questions or concerns.   Beckey Rutter, NP 05/13/17 10:19 AM    Verlon Au, NP 05/13/2017 10:19 AM

## 2017-05-13 NOTE — Progress Notes (Signed)
Patient is here today for a follow up. Patient complains today of joint and muscle aches.

## 2017-05-13 NOTE — Assessment & Plan Note (Addendum)
Recurrent  Platinum refractory ovarian cancer [dec 2017] August 10 CT scan- improvement/resolution of the pelvic peritoneal nodularity. August 2018- Gynecological exam with Dr. Theora Gianotti- resolution of the palpable metastatic disease. Currently on Avastin maintenance.  No concerns for clinical progression- Ca 125-slightly rising.   # Proceed with Avastin today- tolerating well.   # left hip pain-worsening; Tylenol as needed for pain not to exceed 3grams per day. May use topical otc creams for pain as well. X-ray of hip negative. No palpable inguinal lymphadenopathy. Positive left side straight leg raise raises suspicion of MSK etiology. Patient not interested in referral to ortho at this time. Can re-consider in future.  # Blurry vision- resolved  # folow up in 3 weeks/Avastin/UA/ labs.  I personally interviewed and examined the patient. Agreed with the above plan of care. Patient/family questions were answered. Dr.Shrihan Putt MD

## 2017-05-14 LAB — CA 125: CANCER ANTIGEN (CA) 125: 14.3 U/mL (ref 0.0–38.1)

## 2017-06-03 ENCOUNTER — Inpatient Hospital Stay: Payer: PPO

## 2017-06-03 ENCOUNTER — Inpatient Hospital Stay: Payer: PPO | Attending: Internal Medicine

## 2017-06-03 ENCOUNTER — Inpatient Hospital Stay (HOSPITAL_BASED_OUTPATIENT_CLINIC_OR_DEPARTMENT_OTHER): Payer: PPO | Admitting: Internal Medicine

## 2017-06-03 VITALS — BP 131/83 | HR 84 | Temp 98.4°F | Wt 191.8 lb

## 2017-06-03 DIAGNOSIS — I4891 Unspecified atrial fibrillation: Secondary | ICD-10-CM | POA: Diagnosis not present

## 2017-06-03 DIAGNOSIS — Z800 Family history of malignant neoplasm of digestive organs: Secondary | ICD-10-CM

## 2017-06-03 DIAGNOSIS — C569 Malignant neoplasm of unspecified ovary: Secondary | ICD-10-CM

## 2017-06-03 DIAGNOSIS — M5432 Sciatica, left side: Secondary | ICD-10-CM | POA: Insufficient documentation

## 2017-06-03 DIAGNOSIS — Z90710 Acquired absence of both cervix and uterus: Secondary | ICD-10-CM

## 2017-06-03 DIAGNOSIS — Z853 Personal history of malignant neoplasm of breast: Secondary | ICD-10-CM | POA: Insufficient documentation

## 2017-06-03 DIAGNOSIS — Z9221 Personal history of antineoplastic chemotherapy: Secondary | ICD-10-CM | POA: Insufficient documentation

## 2017-06-03 DIAGNOSIS — E039 Hypothyroidism, unspecified: Secondary | ICD-10-CM | POA: Insufficient documentation

## 2017-06-03 DIAGNOSIS — Z23 Encounter for immunization: Secondary | ICD-10-CM | POA: Diagnosis not present

## 2017-06-03 DIAGNOSIS — Z79899 Other long term (current) drug therapy: Secondary | ICD-10-CM

## 2017-06-03 DIAGNOSIS — G8929 Other chronic pain: Secondary | ICD-10-CM | POA: Diagnosis not present

## 2017-06-03 DIAGNOSIS — M545 Low back pain: Secondary | ICD-10-CM

## 2017-06-03 DIAGNOSIS — C786 Secondary malignant neoplasm of retroperitoneum and peritoneum: Secondary | ICD-10-CM | POA: Diagnosis not present

## 2017-06-03 DIAGNOSIS — K219 Gastro-esophageal reflux disease without esophagitis: Secondary | ICD-10-CM | POA: Insufficient documentation

## 2017-06-03 DIAGNOSIS — Z5112 Encounter for antineoplastic immunotherapy: Secondary | ICD-10-CM | POA: Insufficient documentation

## 2017-06-03 DIAGNOSIS — E785 Hyperlipidemia, unspecified: Secondary | ICD-10-CM | POA: Diagnosis not present

## 2017-06-03 DIAGNOSIS — Z803 Family history of malignant neoplasm of breast: Secondary | ICD-10-CM | POA: Insufficient documentation

## 2017-06-03 DIAGNOSIS — Z90722 Acquired absence of ovaries, bilateral: Secondary | ICD-10-CM

## 2017-06-03 DIAGNOSIS — M25552 Pain in left hip: Secondary | ICD-10-CM

## 2017-06-03 LAB — COMPREHENSIVE METABOLIC PANEL
ALT: 14 U/L (ref 14–54)
AST: 26 U/L (ref 15–41)
Albumin: 3.6 g/dL (ref 3.5–5.0)
Alkaline Phosphatase: 61 U/L (ref 38–126)
Anion gap: 11 (ref 5–15)
BUN: 15 mg/dL (ref 6–20)
CO2: 23 mmol/L (ref 22–32)
Calcium: 9.4 mg/dL (ref 8.9–10.3)
Chloride: 103 mmol/L (ref 101–111)
Creatinine, Ser: 1.02 mg/dL — ABNORMAL HIGH (ref 0.44–1.00)
GFR calc Af Amer: >60 mL/min (ref >60)
GFR calc non Af Amer: 53 mL/min — ABNORMAL LOW (ref >60)
Glucose, Bld: 102 mg/dL — ABNORMAL HIGH (ref 65–99)
Potassium: 4.1 mmol/L (ref 3.5–5.1)
Sodium: 137 mmol/L (ref 135–145)
Total Bilirubin: 0.6 mg/dL (ref 0.3–1.2)
Total Protein: 6.9 g/dL (ref 6.5–8.1)

## 2017-06-03 LAB — CBC WITH DIFFERENTIAL/PLATELET
BASOS PCT: 1 %
Basophils Absolute: 0 10*3/uL (ref 0–0.1)
EOS ABS: 0.2 10*3/uL (ref 0–0.7)
EOS PCT: 4 %
HCT: 41 % (ref 35.0–47.0)
HEMOGLOBIN: 13.7 g/dL (ref 12.0–16.0)
Lymphocytes Relative: 53 %
Lymphs Abs: 2.7 10*3/uL (ref 1.0–3.6)
MCH: 31.6 pg (ref 26.0–34.0)
MCHC: 33.4 g/dL (ref 32.0–36.0)
MCV: 94.5 fL (ref 80.0–100.0)
Monocytes Absolute: 0.6 10*3/uL (ref 0.2–0.9)
Monocytes Relative: 11 %
NEUTROS PCT: 31 %
Neutro Abs: 1.5 10*3/uL (ref 1.4–6.5)
PLATELETS: 255 10*3/uL (ref 150–440)
RBC: 4.34 MIL/uL (ref 3.80–5.20)
RDW: 14.6 % — ABNORMAL HIGH (ref 11.5–14.5)
WBC: 4.9 10*3/uL (ref 3.6–11.0)

## 2017-06-03 LAB — URINALYSIS, COMPLETE (UACMP) WITH MICROSCOPIC
BILIRUBIN URINE: NEGATIVE
Bacteria, UA: NONE SEEN
GLUCOSE, UA: NEGATIVE mg/dL
HGB URINE DIPSTICK: NEGATIVE
Ketones, ur: NEGATIVE mg/dL
LEUKOCYTES UA: NEGATIVE
NITRITE: NEGATIVE
PROTEIN: NEGATIVE mg/dL
Specific Gravity, Urine: 1.01 (ref 1.005–1.030)
pH: 6 (ref 5.0–8.0)

## 2017-06-03 MED ORDER — INFLUENZA VAC SPLIT QUAD 0.5 ML IM SUSY
0.5000 mL | PREFILLED_SYRINGE | Freq: Once | INTRAMUSCULAR | Status: AC
  Administered 2017-06-03: 0.5 mL via INTRAMUSCULAR
  Filled 2017-06-03: qty 0.5

## 2017-06-03 MED ORDER — SODIUM CHLORIDE 0.9 % IV SOLN
Freq: Once | INTRAVENOUS | Status: AC
  Administered 2017-06-03: 11:00:00 via INTRAVENOUS
  Filled 2017-06-03: qty 1000

## 2017-06-03 MED ORDER — SODIUM CHLORIDE 0.9% FLUSH
10.0000 mL | INTRAVENOUS | Status: DC | PRN
  Administered 2017-06-03: 10 mL
  Filled 2017-06-03: qty 10

## 2017-06-03 MED ORDER — BEVACIZUMAB CHEMO INJECTION 400 MG/16ML
15.0000 mg/kg | Freq: Once | INTRAVENOUS | Status: AC
  Administered 2017-06-03: 1300 mg via INTRAVENOUS
  Filled 2017-06-03: qty 48

## 2017-06-03 MED ORDER — HEPARIN SOD (PORK) LOCK FLUSH 100 UNIT/ML IV SOLN
500.0000 [IU] | Freq: Once | INTRAVENOUS | Status: AC | PRN
  Administered 2017-06-03: 500 [IU]
  Filled 2017-06-03: qty 5

## 2017-06-03 NOTE — Progress Notes (Signed)
[place] OFFICE PROGRESS NOTE  Patient Care Team: Jerrol Banana., MD as PCP - General (Family Medicine) Gillis Ends, MD as Referring Physician (Obstetrics and Gynecology) Bary Castilla Forest Gleason, MD as Consulting Physician (General Surgery)  Ovarian cancer [facility])   Staging form: Ovary, AJCC 7th Edition     Clinical: Stage IIIC (T3c, N0, M0) - Unsigned   Oncology History   # April- MAY 2016- OVARIAN CANCER STAGE IIIC; CA-125 +2300+;  s/p OPTIMAL DEBULKING SURGERY   # MAY 2016[name] [finished in Sep 19th 2017]  # MAY CT 2017- RECURRENT/Peritoneal carcinomatosis/pelvic implant ~1.2cm/Ca-125-34;   # July 3rd- CARBO-DOXIL q 4W [with neulasta]; CT May 11 2016- slight increase peritoneal / stable nodules. Cont chemo [finished DEC 2017]. DEC 28th 2017 CT- Increase in peritoneal deposits; increasing Ca 125 [60s];  # Jan 2018- carbo-Taxol-Avastin x6 cycles; Aug 2018- CR; Aug 2018- start Avastin Maintenance  # G-1-2 hand foot syndrome-   # LEFT BREAST CA s/p Lumpec & RT s/p TAM   # Afib [cardiology]; JUNE 2017- MUGA 62%  MOLECULAR TESTING- ATM genetic mutation; TUMOR BRCA- NEG; Myriad genomic instability- NEGATIVE     Ovarian cancer, unspecified laterality ([place])    INTERVAL HISTORY:  76 year old female patient with above history of ovarian cancer recurrent Platinum Refractory ovarian cancer [dec 2017]. Currently on maintenance Avastin is here for follow-up.   She last saw Dr. Theora Gianotti 03/06/17 whose exam showed resolution of the previously palpable vaginal mass. CA 125 has trended down this past year and began slowly (April 12.6)  trending back up in July 13.3 and August to 14.9. Urinalysis has been consistently negative for proteinurea. Patient brings blood pressure record which shows 2 sbps >160 and one episode of irregular heartbeat which resolved spontaneously. She is followed by cardiology who regularly performs ECGs for propafenone. Denies  delayed wound healing, bloody or black stools. Denies headaches, significant numbness or tingling in extremities. No spontaneous epistaxis. Constipation has resolved.   Patient continues to have pain of low back that radiates down left hip and leg. Xray was negative. Pain persists. She rates as 6/10, laying down improves pain, sitting on hard surfaces and walking exacerbates pain. Continues to deny swelling, erythema, or temperature change.     REVIEW OF SYSTEMS:  A complete 10 point review of system is done which is negative except mentioned above/history of present illness.   PAST MEDICAL HISTORY :  Past Medical History:  Diagnosis Date  . Atrial fibrillation ([place])   . Cancer of breast ([place]) 1998   Left  . CINV (chemotherapy-induced nausea and vomiting)   . GERD (gastroesophageal reflux disease)   . Hyperlipidemia   . Hypothyroidism   . Mitral insufficiency   . Ovarian cancer [facility])    s/p BSO optimal tumor debulking May 2016  . PVC (premature ventricular contraction)     PAST SURGICAL HISTORY :   Past Surgical History:  Procedure Laterality Date  . ABDOMINAL HYSTERECTOMY    . BILATERAL SALPINGOOPHORECTOMY  May 2016   with optimal tumor debulking   . BREAST SURGERY     Left  . CESAREAN SECTION     x2  . CHOLECYSTECTOMY    . COLONOSCOPY  2006  . DEBULKING N/A 12/22/2014   Procedure: DEBULKING;  Surgeon: Gillis Ends, MD;  Location: [facility] ORS;  Service: Gynecology;  Laterality: N/A;  . LAPAROTOMY N/A 12/22/2014   Procedure: EXPLORATORY LAPAROTOMY;  Surgeon: Robert Bellow, MD;  Location: [facility] ORS;  Service: General;  Laterality: N/A;  . LAPAROTOMY WITH STAGING N/A 12/22/2014   Procedure: LAPAROTOMY WITH STAGING;  Surgeon: Artelia Laroche, MD;  Location: [facility] ORS;  Service: Gynecology;  Laterality: N/A;  . PERIPHERAL VASCULAR CATHETERIZATION Left 12/22/2014   Procedure: PORTA CATH INSERTION;  Surgeon: Earline Mayotte, MD;  Location: [facility] ORS;  Service: General;   Laterality: Left;  . PORTACATH PLACEMENT Right 12/22/2014   Procedure: INSERTION PORT-A-CATH;  Surgeon: Earline Mayotte, MD;  Location: [facility] ORS;  Service: General;  Laterality: Right;  . SALPINGOOPHORECTOMY Bilateral 12/22/2014   Procedure: SALPINGO OOPHORECTOMY;  Surgeon: Artelia Laroche, MD;  Location: [facility] ORS;  Service: Gynecology;  Laterality: Bilateral;  . UPPER GI ENDOSCOPY  09/25/04   hiatus hernia, schatzki ring and a single gastric polyp    FAMILY HISTORY :   Family History  Problem Relation Age of Onset  . Cancer Mother        breast, throat, and stomach  . Breast cancer Mother   . Heart disease Father   . Pulmonary embolism Sister   . Cancer Brother 60       angiosarcoma of the chest; carcinoid small intestinal tumor  . Hypertension Brother   . Cancer Maternal Aunt        breast cancer  . Cancer Maternal Grandfather 98       pancreatic    SOCIAL HISTORY:   Social History  Substance Use Topics  . Smoking status: Never Smoker  . Smokeless tobacco: Never Used  . Alcohol use No    ALLERGIES:  is allergic to carafate [sucralfate] and sulfa antibiotics.  MEDICATIONS:  Current Outpatient Prescriptions  Medication Sig Dispense Refill  . acetaminophen (TYLENOL) 500 MG chewable tablet Chew 500 mg by mouth every 6 (six) hours as needed for pain.    . Cholecalciferol (VITAMIN D) 2000 units tablet Take 1 tablet (2,000 Units total) by mouth daily. 30 tablet 12  . Docosanol (ABREVA EX) Apply 1 application topically as needed. Fever blisters    . lactulose (CHRONULAC) 10 GM/15ML solution TAKE (10G TOTAL) BY MOUTH THREE TIMES A DAY AS DIRECTED BY PHYSICIAN. 240 mL 1  . levothyroxine (SYNTHROID, LEVOTHROID) 50 MCG tablet Take 1 tablet (50 mcg total) by mouth daily. 30 tablet 12  . lidocaine-prilocaine (EMLA) cream Apply to port area 30-45 mins prior to chemo. 30 g 5  . LORazepam (ATIVAN) 0.5 MG tablet Take 0.5 tablets (0.25 mg total) by mouth at bedtime. 30 tablet 4   . Lysine 500 MG TABS Take 1 tablet by mouth daily.    . Multiple Vitamin (MULTIVITAMIN) capsule Take 1 capsule by mouth daily.    . pantoprazole (PROTONIX) 40 MG tablet Take 1 tablet (40 mg total) by mouth daily. 30 tablet 12  . polyethylene glycol powder (GLYCOLAX/MIRALAX) powder TAKE 17GM (LINE INSIDE CAP) IN EIGHT OUNCES OF WATER OR OTHER LIQUID DAILY 255 g 2  . prochlorperazine (COMPAZINE) 10 MG tablet Take 1 tablet (10 mg total) by mouth every 6 (six) hours as needed for nausea or vomiting. 30 tablet 0  . propafenone (RYTHMOL) 225 MG tablet Take 225 mg by mouth 2 (two) times daily.    . traMADol (ULTRAM) 50 MG tablet Take 1 tablet (50 mg total) by mouth every 6 (six) hours as needed. 30 tablet 0  . ondansetron (ZOFRAN) 4 MG tablet Take 1 tablet (4 mg total) by mouth every 8 (eight) hours as needed for nausea or vomiting. (Patient not taking: Reported on  06/03/2017) 30 tablet 3   No current facility-administered medications for this visit.    Facility-Administered Medications Ordered in Other Visits  Medication Dose Route Frequency Provider Last Rate Last Dose  . sodium chloride 0.9 % injection 10 mL  10 mL Intracatheter PRN Forest Gleason, MD   10 mL at 02/07/15 0930  . sodium chloride flush (NS) 0.9 % injection 10 mL  10 mL Intracatheter PRN Cammie Sickle, MD   10 mL at 06/03/17 0905    PHYSICAL EXAMINATION: ECOG PERFORMANCE STATUS: 0 - Asymptomatic  BP 131/83 (BP Location: Right Arm)   Pulse 84   Temp 98.4 F (36.9 C) (Tympanic)   Wt 191 lb 12.8 oz (87 kg)   BMI 33.44 kg/m   Filed Weights   06/03/17 0940  Weight: 191 lb 12.8 oz (87 kg)    GENERAL: Well-nourished well-developed; Alert, no distress and comfort. Accompanied by her husband. EYES: no pallor or icterus OROPHARYNX: no thrush or ulceration; good dentition  NECK: supple, no masses felt LYMPH:  no palpable lymphadenopathy in the cervical, axillary or inguinal regions.  LUNGS: clear to auscultation and  No  wheeze or crackles HEART/CVS: regular rate & rhythm and no murmurs; No lower extremity edema  ABDOMEN:abdomen soft, non-tender and normal bowel sounds Musculoskeletal:no cyanosis of digits and no clubbing. No redness, temperature warm and well perfused.  PSYCH: alert & oriented x 3 with fluent speech NEURO: no focal motor/sensory deficits SKIN:  Normal. No rash noted.   LABORATORY DATA:  I have reviewed the data as listed    Component Value Date/Time   NA 137 06/03/2017 0911   NA 140 12/14/2014 1037   K 4.1 06/03/2017 0911   K 4.4 12/14/2014 1037   CL 103 06/03/2017 0911   CL 106 12/14/2014 1037   CO2 23 06/03/2017 0911   CO2 26 12/14/2014 1037   GLUCOSE 102 (H) 06/03/2017 0911   GLUCOSE 87 12/14/2014 1037   BUN 15 06/03/2017 0911   BUN 12 12/14/2014 1037   CREATININE 1.02 (H) 06/03/2017 0911   CREATININE 0.97 12/14/2014 1037   CALCIUM 9.4 06/03/2017 0911   CALCIUM 9.2 12/14/2014 1037   PROT 6.9 06/03/2017 0911   PROT 7.3 12/14/2014 1037   ALBUMIN 3.6 06/03/2017 0911   ALBUMIN 3.8 12/14/2014 1037   AST 26 06/03/2017 0911   AST 20 12/14/2014 1037   ALT 14 06/03/2017 0911   ALT 11 (L) 12/14/2014 1037   ALKPHOS 61 06/03/2017 0911   ALKPHOS 138 (H) 12/14/2014 1037   BILITOT 0.6 06/03/2017 0911   BILITOT 0.5 12/14/2014 1037   GFRNONAA 53 (L) 06/03/2017 0911   GFRNONAA 59 (L) 12/14/2014 1037   GFRAA >60 06/03/2017 0911   GFRAA >60 12/14/2014 1037    No results found for: SPEP, UPEP  Lab Results  Component Value Date   WBC 4.9 06/03/2017   NEUTROABS 1.5 06/03/2017   HGB 13.7 06/03/2017   HCT 41.0 06/03/2017   MCV 94.5 06/03/2017   PLT 255 06/03/2017      Chemistry      Component Value Date/Time   NA 137 06/03/2017 0911   NA 140 12/14/2014 1037   K 4.1 06/03/2017 0911   K 4.4 12/14/2014 1037   CL 103 06/03/2017 0911   CL 106 12/14/2014 1037   CO2 23 06/03/2017 0911   CO2 26 12/14/2014 1037   BUN 15 06/03/2017 0911   BUN 12 12/14/2014 1037   CREATININE  1.02 (H) 06/03/2017 0911  CREATININE 0.97 12/14/2014 1037   GLU 84 03/16/2014      Component Value Date/Time   CALCIUM 9.4 06/03/2017 0911   CALCIUM 9.2 12/14/2014 1037   ALKPHOS 61 06/03/2017 0911   ALKPHOS 138 (H) 12/14/2014 1037   AST 26 06/03/2017 0911   AST 20 12/14/2014 1037   ALT 14 06/03/2017 0911   ALT 11 (L) 12/14/2014 1037   BILITOT 0.6 06/03/2017 0911   BILITOT 0.5 12/14/2014 1037     IMPRESSION: 1. Status post hysterectomy, without recurrent or metastatic disease. 2.  Possible constipation. 3.  Tiny hiatal hernia. 4. Hepatic hemangiomas.  5. Cystic lesion within the right-side of the vagina is similar over prior exams and may represent a Bartholin's gland cyst.   Electronically Signed   By: Abigail Miyamoto M.D.   On: 03/28/2017 12:09 Results for JAQLYN, GRUENHAGEN (MRN [id number]) as of 04/24/2017 09:53  Ref. Range 12/08/2014 14:59 03/14/2015 15:13 06/08/2015 11:54 07/20/2015 11:43 09/13/2015 10:30 12/14/2015 10:02 01/04/2016 13:19 02/20/2016 09:58 03/21/2016 08:57 05/16/2016 11:14 06/13/2016 09:51 06/27/2016 09:50 08/15/2016 08:55 09/24/2016 09:03 10/22/2016 08:26 11/05/2016 08:28 11/26/2016 08:28 01/21/2017 09:13 02/25/2017 08:19 04/01/2017 08:40  CA 125 Latest Ref Range: 0.0 - 38.1 U/mL 2,354.0 (H) 9.8 14.9 14.5 17.8 24.4 34.1 47.5 (H) 31.1 37.5 44.8 (H) 50.4 (H) 64.5 (H) 101.7 (H) 18.2 13.0 12.6 12.8    Cancer Antigen (CA) 125 Latest Ref Range: 0.0 - 38.1 U/mL                   13.3 14.9    RADIOGRAPHIC STUDIES: I have personally reviewed the radiological images as listed and agreed with the findings in the report. No results found.   ASSESSMENT & PLAN:  Ovarian cancer, unspecified laterality ([place]) Recurrent  Platinum refractory ovarian cancer [dec 2017] August 10 CT scan- improvement/resolution of the pelvic peritoneal nodularity. August 2018- Gynecological exam with Dr. Theora Gianotti- resolution of the palpable metastatic disease. Currently on Avastin maintenance.  No concerns for clinical  progression- Ca 125-slightly rising.   # Proceed with Avastin today- tolerating well.   # left hip pain-worsening; Tylenol as needed for pain not to exceed 3grams per day. May use topical otc creams for pain as well. X-ray of hip negative. No palpable inguinal lymphadenopathy. Positive left side straight leg raise raises suspicion of MSK etiology. Patient not interested in referral to ortho at this time. Can re-consider in future.  # Blurry vision- resolved  # folow up in 3 weeks/Avastin/UA/ labs.  I personally interviewed and examined the patient. Agreed with the above plan of care. Patient/family questions were answered. Dr.Brahmanday MD    Orders Placed This Encounter  Procedures  . AMB referral to orthopedics    Referral Priority:   Routine    Referral Type:   Consultation    Number of Visits Requested:   1   All questions were answered. The patient knows to call the clinic with any problems, questions or concerns.   Beckey Rutter, NP 05/13/17 10:19 AM    Verlon Au, NP 06/03/2017 2:50 PM

## 2017-06-03 NOTE — Assessment & Plan Note (Signed)
Recurrent  Platinum refractory ovarian cancer [dec 2017] August 10 CT scan- improvement/resolution of the pelvic peritoneal nodularity. August 2018- Gynecological exam with Dr. Theora Gianotti- resolution of the palpable metastatic disease. Currently on Avastin maintenance.  No concerns for clinical progression- Ca 125-slightly rising.   # Proceed with Avastin today- tolerating well.   # left hip pain-worsening; Tylenol as needed for pain not to exceed 3grams per day. May use topical otc creams for pain as well. X-ray of hip negative. No palpable inguinal lymphadenopathy. Positive left side straight leg raise raises suspicion of MSK etiology. Patient not interested in referral to ortho at this time. Can re-consider in future.  # Blurry vision- resolved  # folow up in 3 weeks/Avastin/UA/ labs.  I personally interviewed and examined the patient. Agreed with the above plan of care. Patient/family questions were answered. Dr.Jonny Longino MD

## 2017-06-03 NOTE — Progress Notes (Signed)
Patient states she has been having some increasing lower back pain, which is making it very difficult to stand up and walk.  Patient also states her big toe on her right foot gets inflammed and it very sore to the touch.

## 2017-06-05 ENCOUNTER — Ambulatory Visit: Payer: PPO

## 2017-06-12 ENCOUNTER — Other Ambulatory Visit: Payer: Self-pay | Admitting: Family Medicine

## 2017-06-12 ENCOUNTER — Inpatient Hospital Stay (HOSPITAL_BASED_OUTPATIENT_CLINIC_OR_DEPARTMENT_OTHER): Payer: PPO | Admitting: Obstetrics and Gynecology

## 2017-06-12 ENCOUNTER — Encounter: Payer: Self-pay | Admitting: Obstetrics and Gynecology

## 2017-06-12 VITALS — BP 127/88 | HR 86 | Temp 97.6°F | Resp 20 | Ht 64.0 in | Wt 191.4 lb

## 2017-06-12 DIAGNOSIS — E785 Hyperlipidemia, unspecified: Secondary | ICD-10-CM

## 2017-06-12 DIAGNOSIS — C786 Secondary malignant neoplasm of retroperitoneum and peritoneum: Secondary | ICD-10-CM | POA: Diagnosis not present

## 2017-06-12 DIAGNOSIS — I4891 Unspecified atrial fibrillation: Secondary | ICD-10-CM | POA: Diagnosis not present

## 2017-06-12 DIAGNOSIS — E039 Hypothyroidism, unspecified: Secondary | ICD-10-CM

## 2017-06-12 DIAGNOSIS — Z5112 Encounter for antineoplastic immunotherapy: Secondary | ICD-10-CM | POA: Diagnosis not present

## 2017-06-12 DIAGNOSIS — C569 Malignant neoplasm of unspecified ovary: Secondary | ICD-10-CM | POA: Diagnosis not present

## 2017-06-12 DIAGNOSIS — Z79899 Other long term (current) drug therapy: Secondary | ICD-10-CM | POA: Diagnosis not present

## 2017-06-12 DIAGNOSIS — Z90722 Acquired absence of ovaries, bilateral: Secondary | ICD-10-CM

## 2017-06-12 DIAGNOSIS — Z9010 Acquired absence of unspecified breast and nipple: Secondary | ICD-10-CM | POA: Diagnosis not present

## 2017-06-12 DIAGNOSIS — Z1231 Encounter for screening mammogram for malignant neoplasm of breast: Secondary | ICD-10-CM

## 2017-06-12 DIAGNOSIS — M5432 Sciatica, left side: Secondary | ICD-10-CM | POA: Diagnosis not present

## 2017-06-12 NOTE — Progress Notes (Signed)
  Oncology Nurse Navigator Documentation Chaperoned pelvic exam. Follow up in 3 months. Navigator Location: [facility] (06/12/17 0900)   )Navigator Encounter Type: Follow-up Appt (06/12/17 0900)                     Patient Visit Type: GynOnc (06/12/17 0900)                              Time Spent with Patient: 15 (06/12/17 0900)

## 2017-06-12 NOTE — Progress Notes (Signed)
Couple of days after avastin-she has itching a couple of days after treatment each time.  She also has felt a pointy spot at her port site that hurts

## 2017-06-12 NOTE — Progress Notes (Addendum)
Gynecologic Oncology Interval Visit   Referring Provider: Blain Pais, MD.  Chief Concern: Platinum-resistant recurrent stage IIIc ovarian cancer  Subjective:  Jamie Conner is a 76 y.o. female who is seen for platinum-resistant recurrent stage IIIc ovarian cancer.   She was recently seen by Dr. Rogue Bussing for maintenance bevacizumab. She had received 6 cycles of paclitaxel and bevacizumab 03/04/2017.   She is tolerating therapy well but has noted elevated blood pressures. See ROS below. She is concerned about her blood pressures with home readings with SBPs in the 150-160s. Her DPB are all less than 90s. She does not have headaches, visual changes, chest pain, or neurologic symptoms. She also complains of left sided sciatica. She has not done PT for this symptom.   She presents today for a pelvic exam.  CT Abdomen and Pelvis 03/28/2017 IMPRESSION: 1. Status post hysterectomy, without recurrent or metastatic disease. 2.  Possible constipation. 3.  Tiny hiatal hernia. 4. Hepatic hemangiomas. 5. Cystic lesion within the right-side of the vagina is similar over prior exams and may represent a Bartholin's gland cyst.  Gynecologic Oncology History Jamie Conner is a pleasant female who is seen for postoperative visit for stage IIIc high-grade serous ovarian cancer. See prior notes for complete detail.  She also has a history of  carcinoma of breast ( left) status post lumpectomy , radiation therapy and 5 years of tamoxifen. She underwent exploratory Laparotomy, bilateral salpingo-oophorectomy, right ureterolysis, infragastric omentectomy, and optimally debulked to no gross residual  May 4th, 2016. Preop CA125 2354.0.  She started chemotherapy with carboplatinum and Taxol in dose dense fashion from Jan 02, 2015. She underwent 6 cycles of chemotherapy completed 05/09/2015. Her dose of Taxol was reduced because of myelosuppression and fever in spite of Neulasta.  [place] CA125 =  9.8  06/06/2015 CT scan abdomen and pelvis IMPRESSION: 1. Interval removal of the large right ovarian mass . Dramatic improvement in the appearance of mesenteric and omental implants. There is a 3 mm potential faint omental nodule at about the level of the umbilicus but for the most part the numerous prior omental and mesenteric tumor implants have resolved completely. 2. 1.8 by 1.2 cm fluid density structure along the splenic hilum, no change from prior, likely a small benign cystic lesion.  3. Other imaging findings of potential clinical significance: 3 cm periampullary duodenal diverticulum.   01/04/16 CA125 34.1  01/10/2016 CT scan chest abdomen and pelvis. 1. New nodular density 12 x 7 mm ties are seen throughout the pelvis. The largest measures 12 x 10 mm best seen on image number 105 of series. A 9 mm nodule is seen in the left pelvis best seen on image number 60 of series 5. These are concerning for possible peritoneal implants. 2. Stable 1.9 cm cystic lesion seen in splenic hilum.  Vaginal biopsy performed on 01/04/2016 negative  She was started on PLD and carboplatin   05/11/2016 CT scan abdomen and pelvis IMPRESSION: 1. No acute findings identified within the abdomen or pelvis. 2. Peritoneal nodules within the right side of pelvis are stable the slightly increased in size from previous exam. No new areas of disease identified.  She has completed 6 cycles of  PLD and carboplatin therapy and imaging revealed progressive disease.   08/15/2016 CT scan abdomen and pelvis 1. Study demonstrates slight interval growth of several peritoneal implants in the low anatomic pelvis indicative of slight progression of disease. No other new peritoneal deposits are noted, and there is no other  metastatic disease noted elsewhere in the chest, abdomen or pelvis. 2. Colonic diverticulosis without evidence of acute diverticulitis at this time. 3. Aortic atherosclerosis, in addition to left main  coronary artery disease. Assessment for potential risk factor modification, dietary therapy or pharmacologic therapy may be warranted, if clinically indicated. 4. Additional incidental findings, as above.  Rucaparib denied by insurance.   She was started on paclitaxel and bevacizumab on 09/24/2016 and received 6 cycles of paclitaxel and bevacizumab 03/04/2017 with evidence of response  CA125 02/20/2016 47.5 03/21/2016 31.1 05/16/2016 37.5 06/13/2016 44.8 06/27/2016 50.4 08/15/2016 64.5 09/24/2016 101.7 10/22/2016 18.2 11/05/2016 13. 0 11/26/2016 12.6 01/21/2017 12.8 02/25/2017 13.3 04/01/2017 14.9 05/13/2017 14.3  Genetic testing: ATM mutation [id number].  HRD testing negative ([facility])  I spoke with Dr. Lisbeth Ply at [facility] and he recommended genetic counseling for the patient and possibly other family members. He provided a phone number for them to call to schedule that appointment. The number is [phone]. Of note her daughter has tested positive for an ATM mutation carrier and she requested recommendations for who to see at [facility].    Problem List: Patient Active Problem List   Diagnosis Date Noted  . Counseling regarding goals of care 11/26/2016  . Encounter for monitoring cardiotoxic drug therapy 02/02/2016  . Breast cancer in female [facility]) 12/13/2015  . Peptic ulcer disease 12/12/2015  . DDD (degenerative disc disease), lumbosacral 12/12/2015  . Hyperlipidemia 12/12/2015  . Acute anxiety 12/12/2015  . Insomnia 12/12/2015  . Allergic rhinitis 12/12/2015  . GERD (gastroesophageal reflux disease) 12/12/2015  . Internal hemorrhoid 12/12/2015  . OA (osteoarthritis) 12/12/2015  . Osteopenia 12/12/2015  . Hypothyroid 04/11/2015  . Benign essential HTN 02/01/2015  . Retroperitoneal fibrosis   . Combined fat and carbohydrate induced hyperlipemia 01/04/2015  . Awareness of heartbeats 01/04/2015  . Beat, premature ventricular 01/04/2015  . Malignant neoplasm of ovary ([place]) 12/16/2014  . MI  (mitral incompetence) 09/02/2014  . AF (paroxysmal atrial fibrillation) ([place]) 09/02/2014    Past Medical History: Past Medical History:  Diagnosis Date  . Atrial fibrillation ([place])   . Cancer of breast ([place]) 1998   Left  . CINV (chemotherapy-induced nausea and vomiting)   . GERD (gastroesophageal reflux disease)   . Hyperlipidemia   . Hypothyroidism   . Mitral insufficiency   . Ovarian cancer [facility])    s/p BSO optimal tumor debulking May 2016  . PVC (premature ventricular contraction)     Past Surgical History: Past Surgical History:  Procedure Laterality Date  . ABDOMINAL HYSTERECTOMY    . BILATERAL SALPINGOOPHORECTOMY  May 2016   with optimal tumor debulking   . BREAST SURGERY     Left  . CESAREAN SECTION     x2  . CHOLECYSTECTOMY    . COLONOSCOPY  2006  . DEBULKING N/A 12/22/2014   Procedure: DEBULKING;  Surgeon: Gillis Ends, MD;  Location: [facility] ORS;  Service: Gynecology;  Laterality: N/A;  . LAPAROTOMY N/A 12/22/2014   Procedure: EXPLORATORY LAPAROTOMY;  Surgeon: Robert Bellow, MD;  Location: [facility] ORS;  Service: General;  Laterality: N/A;  . LAPAROTOMY WITH STAGING N/A 12/22/2014   Procedure: LAPAROTOMY WITH STAGING;  Surgeon: Gillis Ends, MD;  Location: [facility] ORS;  Service: Gynecology;  Laterality: N/A;  . PERIPHERAL VASCULAR CATHETERIZATION Left 12/22/2014   Procedure: PORTA CATH INSERTION;  Surgeon: Robert Bellow, MD;  Location: [facility] ORS;  Service: General;  Laterality: Left;  . PORTACATH PLACEMENT Right 12/22/2014   Procedure: INSERTION PORT-A-CATH;  Surgeon:  Robert Bellow, MD;  Location: [facility] ORS;  Service: General;  Laterality: Right;  . SALPINGOOPHORECTOMY Bilateral 12/22/2014   Procedure: SALPINGO OOPHORECTOMY;  Surgeon: Gillis Ends, MD;  Location: [facility] ORS;  Service: Gynecology;  Laterality: Bilateral;  . UPPER GI ENDOSCOPY  09/25/04   hiatus hernia, schatzki ring and a single gastric polyp    Past Gynecologic History:  See  HPI  OB History:  OB History  Gravida Para Term Preterm AB Living  2         2  SAB TAB Ectopic Multiple Live Births               # Outcome Date GA Lbr Len/2nd Weight Sex Delivery Anes PTL Lv  2 Gravida           1 Gravida             Obstetric Comments  1st Menstrual Cycle:  14  1st Pregnancy:  76    Family History: Family History  Problem Relation Age of Onset  . Cancer Mother        breast, throat, and stomach  . Breast cancer Mother   . Heart disease Father   . Pulmonary embolism Sister   . Cancer Brother 60       angiosarcoma of the chest; carcinoid small intestinal tumor  . Hypertension Brother   . Cancer Maternal Aunt        breast cancer  . Cancer Maternal Grandfather 59       pancreatic    Social History: Social History   Social History  . Marital status: Married    Spouse name: N/A  . Number of children: N/A  . Years of education: N/A   Occupational History  . Not on file.   Social History Main Topics  . Smoking status: Never Smoker  . Smokeless tobacco: Never Used  . Alcohol use No  . Drug use: No  . Sexual activity: Not Currently   Other Topics Concern  . Not on file   Social History Narrative  . No narrative on file    Allergies: Allergies  Allergen Reactions  . Carafate [Sucralfate] Rash  . Sulfa Antibiotics Rash    Current Medications: Current Outpatient Prescriptions  Medication Sig Dispense Refill  . acetaminophen (TYLENOL) 500 MG chewable tablet Chew 500 mg by mouth every 6 (six) hours as needed for pain.    . Cholecalciferol (VITAMIN D) 2000 units tablet Take 1 tablet (2,000 Units total) by mouth daily. 30 tablet 12  . Docosanol (ABREVA EX) Apply 1 application topically as needed. Fever blisters    . levothyroxine (SYNTHROID, LEVOTHROID) 50 MCG tablet Take 1 tablet (50 mcg total) by mouth daily. 30 tablet 12  . lidocaine-prilocaine (EMLA) cream Apply to port area 30-45 mins prior to chemo. 30 g 5  . LORazepam (ATIVAN) 0.5  MG tablet Take 0.5 tablets (0.25 mg total) by mouth at bedtime. 30 tablet 4  . Multiple Vitamin (MULTIVITAMIN) capsule Take 1 capsule by mouth daily.    . ondansetron (ZOFRAN) 4 MG tablet Take 1 tablet (4 mg total) by mouth every 8 (eight) hours as needed for nausea or vomiting. 30 tablet 3  . pantoprazole (PROTONIX) 40 MG tablet Take 1 tablet (40 mg total) by mouth daily. 30 tablet 12  . polyethylene glycol powder (GLYCOLAX/MIRALAX) powder TAKE 17GM (LINE INSIDE CAP) IN EIGHT OUNCES OF WATER OR OTHER LIQUID DAILY 255 g 2  . prochlorperazine (COMPAZINE) 10 MG tablet Take  1 tablet (10 mg total) by mouth every 6 (six) hours as needed for nausea or vomiting. 30 tablet 0  . propafenone (RYTHMOL) 225 MG tablet Take 225 mg by mouth 2 (two) times daily.    [place] Kitchen lactulose (CHRONULAC) 10 GM/15ML solution TAKE 15MLS (10G TOTAL) BY MOUTH THREE TIMES A DAY AS DIRECTED BY PHYSICIAN. (Patient not taking: Reported on 06/12/2017) 240 mL 1  . Lysine 500 MG TABS Take 1 tablet by mouth daily as needed.     . traMADol (ULTRAM) 50 MG tablet Take 1 tablet (50 mg total) by mouth every 6 (six) hours as needed. (Patient not taking: Reported on 06/12/2017) 30 tablet 0   No current facility-administered medications for this visit.    Facility-Administered Medications Ordered in Other Visits  Medication Dose Route Frequency Provider Last Rate Last Dose  . sodium chloride 0.9 % injection 10 mL  10 mL Intracatheter PRN Forest Gleason, MD   10 mL at 02/07/15 0930    Review of Systems General: fatigue; no weakness  HEENT: voice changes   Lungs: negative for SOB or cough  Cardiac: no complaints  GI: using Miralax daily for constipation  GU: vaginal itching a few days after bevacizumab  Musculoskeletal: back pain  Extremities: no complaints  Skin: no complaints  Neuro: peripheral neuropathy  Endocrine: no complaints  Psych: no complaints  Concerned about port placement.        Objective:  Physical Examination:  BP  127/88 (BP Location: Left Arm, Patient Position: Sitting)   Pulse 86   Temp 97.6 F (36.4 C)   Resp 20   Ht '5\' 4"'$  (1.626 m)   Wt 191 lb 6.4 oz (86.8 kg)   BMI 32.85 kg/m . Weight is stable.    ECOG Performance Status: 1 - Symptomatic but completely ambulatory  Physical Exam  Constitutional: She appears well-developed and well-nourished.  HENT:  Head: Normocephalic and atraumatic.  Eyes: Conjunctivae are normal.  Abdominal: Soft. Bowel sounds are normal. She exhibits no distension and no mass. No hernia.  Musculoskeletal: She exhibits no edema.  Neurological: She is alert. No cranial nerve deficit.  Skin: Skin is warm and dry.  Psychiatric: She has a normal mood and affect. Her behavior is normal.   Chest: port may be turned slightly but no evidence of infection or erythema. Still accessible.   Pelvic exam; Chaperoned by nurse: Pelvic: Vulva: normal appearing vulva with no masses, tenderness or lesions; Vagina: normal left paravaginal cyst approximately mid portion of the vagina, no discharge, on BME the left paravaginal cyst was stable and benign appearing; BME: negative for masses or nodularity beyond the vaginal cuff; Uterus and Cervix: surgically absent; Rectal: confirms. The previously palpated 1.5 cm nodule near right vaginal fornix (on 10/10/2016) was no longer appreciated again today.   Lab Review CA125 as noted above  Radiologic Imaging: CT scan reviewed.     Assessment:  Jamie Conner is a 76 y.o. female diagnosed with optimally debulked stage IIIc  high-grade serous ovarian cancer, initially diagnosed 12/22/2014 s/p chemotherapy with complete response. Recurrent ovarian cancer diagnosed 01/10/2016.  ATM mutation positive. HRD negative. Extensive family history of cancer (breast, pancreatic, throat, gastric, adenosarcoma of the chest, carcinoid tumor of the small bowel) in several first degree relatives.  Vaginal lesion, granulation tissue.  Recurrent platinum-sensitive  ovarian cancer with stable disease on PLD/carboplatin based on RECIST criteria with ultimate progression. Platinum-resistant disease initiated on paclitaxel and bevacizumab with evidence of complete clinical response based on exam,  CT scan, and CA125. Tolerating therapy maintenance bevacizumab adequately.   Vulvar/vaginal symptoms possible due to bevacizumab.   Left-sided Sciatica with ambulatory impairment.   Plan:   Problem List Items Addressed This Visit      Genitourinary   Malignant neoplasm of ovary ([place]) - Primary (Chronic)     Continue current therapy with Dr. Rogue Bussing. I reviewed BP precautions. We will continue to follow closely with repeat exam in 3 months.   For sciatica I have recommended lumbar sacral MRI for further evaluation and PT referral. I have asked our team to discuss ordering these tests with Dr. Rogue Bussing.   Gillis Ends, MD    CC:  Blain Pais, MD.

## 2017-06-15 ENCOUNTER — Other Ambulatory Visit: Payer: Self-pay | Admitting: Family Medicine

## 2017-06-15 DIAGNOSIS — K219 Gastro-esophageal reflux disease without esophagitis: Secondary | ICD-10-CM

## 2017-06-18 ENCOUNTER — Other Ambulatory Visit: Payer: Self-pay | Admitting: Nurse Practitioner

## 2017-06-18 DIAGNOSIS — C569 Malignant neoplasm of unspecified ovary: Secondary | ICD-10-CM

## 2017-06-21 ENCOUNTER — Telehealth: Payer: Self-pay | Admitting: Internal Medicine

## 2017-06-21 MED ORDER — LISINOPRIL-HYDROCHLOROTHIAZIDE 20-25 MG PO TABS: 1.0000 | ORAL_TABLET | Freq: Every day | ORAL | 0 refills | Status: DC

## 2017-06-21 NOTE — Telephone Encounter (Signed)
Called re: elevated blood pressure 177/95; head ache/nausea- on avatsin; recommend starting lisinopril-HCTZ asap. Sent script to pharmacy. Also take 0.25 ativan/and zofran. Asked to call us back later in the afternoon for an update. Recommend checking blood pressure- every 3 hours or so.

## 2017-06-21 NOTE — Telephone Encounter (Signed)
Per Dr. B, no new recommendations. Just to continue taking blood pressure medication as directed - but if systolic BP is less than 257, then it is okay to hold the medication.  Please be sure patient keeps follow up appt on Monday as planned.

## 2017-06-21 NOTE — Telephone Encounter (Signed)
Returned call to patient and advised of doctor recommendations and she repeated back to me She states she will be here Monday as planned

## 2017-06-21 NOTE — Telephone Encounter (Signed)
Patient called back to report that her b/p is 134/80. Does she need to do anything else? [phone]

## 2017-06-24 ENCOUNTER — Inpatient Hospital Stay: Payer: PPO

## 2017-06-24 ENCOUNTER — Inpatient Hospital Stay: Payer: PPO | Attending: Internal Medicine

## 2017-06-24 ENCOUNTER — Inpatient Hospital Stay (HOSPITAL_BASED_OUTPATIENT_CLINIC_OR_DEPARTMENT_OTHER): Payer: PPO | Admitting: Internal Medicine

## 2017-06-24 VITALS — BP 153/78 | HR 85 | Temp 97.8°F | Resp 20 | Ht 64.0 in | Wt 189.9 lb

## 2017-06-24 DIAGNOSIS — Z90710 Acquired absence of both cervix and uterus: Secondary | ICD-10-CM | POA: Diagnosis not present

## 2017-06-24 DIAGNOSIS — Z95828 Presence of other vascular implants and grafts: Secondary | ICD-10-CM

## 2017-06-24 DIAGNOSIS — Z9049 Acquired absence of other specified parts of digestive tract: Secondary | ICD-10-CM

## 2017-06-24 DIAGNOSIS — Z803 Family history of malignant neoplasm of breast: Secondary | ICD-10-CM

## 2017-06-24 DIAGNOSIS — R112 Nausea with vomiting, unspecified: Secondary | ICD-10-CM

## 2017-06-24 DIAGNOSIS — C787 Secondary malignant neoplasm of liver and intrahepatic bile duct: Secondary | ICD-10-CM | POA: Insufficient documentation

## 2017-06-24 DIAGNOSIS — Z90722 Acquired absence of ovaries, bilateral: Secondary | ICD-10-CM | POA: Diagnosis not present

## 2017-06-24 DIAGNOSIS — I10 Essential (primary) hypertension: Secondary | ICD-10-CM | POA: Insufficient documentation

## 2017-06-24 DIAGNOSIS — K219 Gastro-esophageal reflux disease without esophagitis: Secondary | ICD-10-CM | POA: Insufficient documentation

## 2017-06-24 DIAGNOSIS — M25552 Pain in left hip: Secondary | ICD-10-CM | POA: Insufficient documentation

## 2017-06-24 DIAGNOSIS — R197 Diarrhea, unspecified: Secondary | ICD-10-CM | POA: Diagnosis not present

## 2017-06-24 DIAGNOSIS — Z9221 Personal history of antineoplastic chemotherapy: Secondary | ICD-10-CM | POA: Diagnosis not present

## 2017-06-24 DIAGNOSIS — E785 Hyperlipidemia, unspecified: Secondary | ICD-10-CM | POA: Insufficient documentation

## 2017-06-24 DIAGNOSIS — C786 Secondary malignant neoplasm of retroperitoneum and peritoneum: Secondary | ICD-10-CM

## 2017-06-24 DIAGNOSIS — Z5111 Encounter for antineoplastic chemotherapy: Secondary | ICD-10-CM | POA: Insufficient documentation

## 2017-06-24 DIAGNOSIS — Z800 Family history of malignant neoplasm of digestive organs: Secondary | ICD-10-CM | POA: Insufficient documentation

## 2017-06-24 DIAGNOSIS — C569 Malignant neoplasm of unspecified ovary: Secondary | ICD-10-CM

## 2017-06-24 DIAGNOSIS — E039 Hypothyroidism, unspecified: Secondary | ICD-10-CM | POA: Insufficient documentation

## 2017-06-24 DIAGNOSIS — Z79899 Other long term (current) drug therapy: Secondary | ICD-10-CM | POA: Diagnosis not present

## 2017-06-24 DIAGNOSIS — Z853 Personal history of malignant neoplasm of breast: Secondary | ICD-10-CM

## 2017-06-24 DIAGNOSIS — R51 Headache: Secondary | ICD-10-CM | POA: Diagnosis not present

## 2017-06-24 DIAGNOSIS — I4891 Unspecified atrial fibrillation: Secondary | ICD-10-CM | POA: Diagnosis not present

## 2017-06-24 LAB — COMPREHENSIVE METABOLIC PANEL
ALT: 14 U/L (ref 14–54)
ANION GAP: 8 (ref 5–15)
AST: 24 U/L (ref 15–41)
Albumin: 3.5 g/dL (ref 3.5–5.0)
Alkaline Phosphatase: 55 U/L (ref 38–126)
BUN: 15 mg/dL (ref 6–20)
CHLORIDE: 100 mmol/L — AB (ref 101–111)
CO2: 24 mmol/L (ref 22–32)
Calcium: 9.2 mg/dL (ref 8.9–10.3)
Creatinine, Ser: 1.04 mg/dL — ABNORMAL HIGH (ref 0.44–1.00)
GFR calc Af Amer: 60 mL/min — ABNORMAL LOW (ref >60)
GFR calc non Af Amer: 52 mL/min — ABNORMAL LOW (ref >60)
GLUCOSE: 103 mg/dL — AB (ref 65–99)
POTASSIUM: 4 mmol/L (ref 3.5–5.1)
SODIUM: 132 mmol/L — AB (ref 135–145)
Total Bilirubin: 0.5 mg/dL (ref 0.3–1.2)
Total Protein: 6.7 g/dL (ref 6.5–8.1)

## 2017-06-24 LAB — CBC WITH DIFFERENTIAL/PLATELET
Basophils Absolute: 0 10*3/uL (ref 0–0.1)
Basophils Relative: 1 %
Eosinophils Absolute: 0.2 10*3/uL (ref 0–0.7)
Eosinophils Relative: 3 %
HEMATOCRIT: 41.5 % (ref 35.0–47.0)
HEMOGLOBIN: 14.1 g/dL (ref 12.0–16.0)
LYMPHS PCT: 48 %
Lymphs Abs: 2.4 10*3/uL (ref 1.0–3.6)
MCH: 31.5 pg (ref 26.0–34.0)
MCHC: 34 g/dL (ref 32.0–36.0)
MCV: 92.4 fL (ref 80.0–100.0)
MONO ABS: 0.7 10*3/uL (ref 0.2–0.9)
MONOS PCT: 13 %
NEUTROS ABS: 1.7 10*3/uL (ref 1.4–6.5)
Neutrophils Relative %: 35 %
PLATELETS: 239 10*3/uL (ref 150–440)
RBC: 4.49 MIL/uL (ref 3.80–5.20)
RDW: 14.2 % (ref 11.5–14.5)
WBC: 5 10*3/uL (ref 3.6–11.0)

## 2017-06-24 LAB — URINALYSIS, COMPLETE (UACMP) WITH MICROSCOPIC
Bacteria, UA: NONE SEEN
Bilirubin Urine: NEGATIVE
Glucose, UA: NEGATIVE mg/dL
Hgb urine dipstick: NEGATIVE
KETONES UR: NEGATIVE mg/dL
Leukocytes, UA: NEGATIVE
Nitrite: NEGATIVE
PH: 6 (ref 5.0–8.0)
PROTEIN: NEGATIVE mg/dL
Specific Gravity, Urine: 1.01 (ref 1.005–1.030)

## 2017-06-24 MED ORDER — HEPARIN SOD (PORK) LOCK FLUSH 100 UNIT/ML IV SOLN
500.0000 [IU] | Freq: Once | INTRAVENOUS | Status: AC
  Administered 2017-06-24: 500 [IU] via INTRAVENOUS
  Filled 2017-06-24: qty 5

## 2017-06-24 MED ORDER — LIDOCAINE-PRILOCAINE 2.5-2.5 % EX CREA: TOPICAL_CREAM | CUTANEOUS | 5 refills | Status: AC

## 2017-06-24 NOTE — Progress Notes (Signed)
1015:  Notified that patients chemotherapy will be held today due to hypertension.  Port deaccessed.

## 2017-06-24 NOTE — Progress Notes (Signed)
[place] OFFICE PROGRESS NOTE  Patient Care Team: Jerrol Banana., MD as PCP - General (Family Medicine) Gillis Ends, MD as Referring Physician (Obstetrics and Gynecology) Bary Castilla Forest Gleason, MD as Consulting Physician (General Surgery)  Ovarian cancer [facility])   Staging form: Ovary, AJCC 7th Edition     Clinical: Stage IIIC (T3c, N0, M0) - Unsigned   Oncology History   # April- MAY 2016- OVARIAN CANCER STAGE IIIC; CA-125 +2300+;  s/p OPTIMAL DEBULKING SURGERY   # MAY 2016[name] [finished in Sep 19th 2017]  # MAY CT 2017- RECURRENT/Peritoneal carcinomatosis/pelvic implant ~1.2cm/Ca-125-34;   # July 3rd- CARBO-DOXIL q 4W [with neulasta]; CT May 11 2016- slight increase peritoneal / stable nodules. Cont chemo [finished DEC 2017]. DEC 28th 2017 CT- Increase in peritoneal deposits; increasing Ca 125 [60s];  # Jan 2018- carbo-Taxol-Avastin x6 cycles; Aug 2018- CR; Aug 2018- start Avastin Maintenance  # G-1-2 hand foot syndrome-   # LEFT BREAST CA s/p Lumpec & RT s/p TAM   # Afib [cardiology]; JUNE 2017- MUGA 62%  MOLECULAR TESTING- ATM genetic mutation; TUMOR BRCA- NEG; Myriad genomic instability- NEGATIVE     Ovary cancer, unspecified laterality ([place])    INTERVAL HISTORY:  76 year old female patient with above history of ovarian cancer recurrent Platinum Refractory ovarian cancer [dec 2017]. Currently on maintenance Avastin is here for follow-up.   Patient last week's celebrated her birthday.  However patient noted to have worsening headaches nausea vomiting diarrhea approximately 4 days ago. Her blood pressure is elevated with systolic in 536I. Patient was started on antihypertensive- lisinopril hydrochlorothiazide- and her blood pressures have since improved/resolved. She just took 2 pills. She brings her log of her blood pressures.   She is awaiting an MRI of the lumbar spine next week. Interested in physical therapy.   REVIEW OF  SYSTEMS:  A complete 10 point review of system is done which is negative except mentioned above/history of present illness.   PAST MEDICAL HISTORY :  Past Medical History:  Diagnosis Date  . Atrial fibrillation ([place])   . Cancer of breast ([place]) 1998   Left  . CINV (chemotherapy-induced nausea and vomiting)   . GERD (gastroesophageal reflux disease)   . Hyperlipidemia   . Hypothyroidism   . Mitral insufficiency   . Ovarian cancer [facility])    s/p BSO optimal tumor debulking May 2016  . PVC (premature ventricular contraction)     PAST SURGICAL HISTORY :   Past Surgical History:  Procedure Laterality Date  . ABDOMINAL HYSTERECTOMY    . BILATERAL SALPINGOOPHORECTOMY  May 2016   with optimal tumor debulking   . BREAST SURGERY     Left  . CESAREAN SECTION     x2  . CHOLECYSTECTOMY    . COLONOSCOPY  2006  . UPPER GI ENDOSCOPY  09/25/04   hiatus hernia, schatzki ring and a single gastric polyp    FAMILY HISTORY :   Family History  Problem Relation Age of Onset  . Cancer Mother        breast, throat, and stomach  . Breast cancer Mother   . Heart disease Father   . Pulmonary embolism Sister   . Cancer Brother 60       angiosarcoma of the chest; carcinoid small intestinal tumor  . Hypertension Brother   . Cancer Maternal Aunt        breast cancer  . Cancer Maternal Grandfather 40       pancreatic  SOCIAL HISTORY:   Social History   Tobacco Use  . Smoking status: Never Smoker  . Smokeless tobacco: Never Used  Substance Use Topics  . Alcohol use: No  . Drug use: No    ALLERGIES:  is allergic to carafate [sucralfate] and sulfa antibiotics.  MEDICATIONS:  Current Outpatient Medications  Medication Sig Dispense Refill  . Cholecalciferol (VITAMIN D) 2000 units tablet Take 1 tablet (2,000 Units total) by mouth daily. 30 tablet 12  . levothyroxine (SYNTHROID, LEVOTHROID) 50 MCG tablet Take 1 tablet (50 mcg total) by mouth daily. 30 tablet 12  . lidocaine-prilocaine (EMLA)  cream Apply to port area 30-45 mins prior to chemo. 30 g 5  . lisinopril-hydrochlorothiazide (PRINZIDE,ZESTORETIC) 20-25 MG tablet Take 1 tablet by mouth daily. 30 tablet 0  . LORazepam (ATIVAN) 0.5 MG tablet Take 0.5 tablets (0.25 mg total) by mouth at bedtime. 30 tablet 4  . Lysine 500 MG TABS Take 1 tablet by mouth daily as needed.     . Multiple Vitamin (MULTIVITAMIN) capsule Take 1 capsule by mouth daily.    . ondansetron (ZOFRAN) 4 MG tablet Take 1 tablet (4 mg total) by mouth every 8 (eight) hours as needed for nausea or vomiting. 30 tablet 3  . pantoprazole (PROTONIX) 40 MG tablet TAKE 1 TABLET BY MOUTH DAILY 30 tablet 11  . polyethylene glycol powder (GLYCOLAX/MIRALAX) powder TAKE 17GM (LINE INSIDE CAP) IN EIGHT OUNCES OF WATER OR OTHER LIQUID DAILY 255 g 2  . propafenone (RYTHMOL) 225 MG tablet Take 225 mg by mouth 2 (two) times daily.    Marland Kitchen acetaminophen (TYLENOL) 500 MG chewable tablet Chew 500 mg by mouth every 6 (six) hours as needed for pain.    . Docosanol (ABREVA EX) Apply 1 application topically as needed. Fever blisters    . lactulose (CHRONULAC) 10 GM/15ML solution TAKE 15MLS (10G TOTAL) BY MOUTH THREE TIMES A DAY AS DIRECTED BY PHYSICIAN. (Patient not taking: Reported on 06/12/2017) 240 mL 1  . prochlorperazine (COMPAZINE) 10 MG tablet Take 1 tablet (10 mg total) by mouth every 6 (six) hours as needed for nausea or vomiting. (Patient not taking: Reported on 06/24/2017) 30 tablet 0  . traMADol (ULTRAM) 50 MG tablet Take 1 tablet (50 mg total) by mouth every 6 (six) hours as needed. (Patient not taking: Reported on 06/12/2017) 30 tablet 0   No current facility-administered medications for this visit.    Facility-Administered Medications Ordered in Other Visits  Medication Dose Route Frequency Provider Last Rate Last Dose  . sodium chloride 0.9 % injection 10 mL  10 mL Intracatheter PRN Forest Gleason, MD   10 mL at 02/07/15 0930    PHYSICAL EXAMINATION: ECOG PERFORMANCE STATUS:  0 - Asymptomatic  BP (!) 153/78 (BP Location: Left Arm, Patient Position: Sitting)   Pulse 85   Temp 97.8 F (36.6 C) (Tympanic)   Resp 20   Ht _0  (1.626 m)   Wt 189 lb 14.4 oz (86.1 kg)   BMI 32.60 kg/m   Filed Weights   06/24/17 0933  Weight: 189 lb 14.4 oz (86.1 kg)    GENERAL: Well-nourished well-developed; Alert, no distress and comfort. Accompanied by her husband. EYES: no pallor or icterus OROPHARYNX: no thrush or ulceration; good dentition  NECK: supple, no masses felt LYMPH:  no palpable lymphadenopathy in the cervical, axillary or inguinal regions.  LUNGS: clear to auscultation and  No wheeze or crackles HEART/CVS: regular rate & rhythm and no murmurs; No lower extremity edema  ABDOMEN:abdomen soft, non-tender and normal bowel sounds Musculoskeletal:no cyanosis of digits and no clubbing. No redness, temperature warm and well perfused.  PSYCH: alert & oriented x 3 with fluent speech NEURO: no focal motor/sensory deficits SKIN:  Normal. No rash noted.   LABORATORY DATA:  I have reviewed the data as listed    Component Value Date/Time   NA 132 (L) 06/24/2017 0900   NA 140 12/14/2014 1037   K 4.0 06/24/2017 0900   K 4.4 12/14/2014 1037   CL 100 (L) 06/24/2017 0900   CL 106 12/14/2014 1037   CO2 24 06/24/2017 0900   CO2 26 12/14/2014 1037   GLUCOSE 103 (H) 06/24/2017 0900   GLUCOSE 87 12/14/2014 1037   BUN 15 06/24/2017 0900   BUN 12 12/14/2014 1037   CREATININE 1.04 (H) 06/24/2017 0900   CREATININE 0.97 12/14/2014 1037   CALCIUM 9.2 06/24/2017 0900   CALCIUM 9.2 12/14/2014 1037   PROT 6.7 06/24/2017 0900   PROT 7.3 12/14/2014 1037   ALBUMIN 3.5 06/24/2017 0900   ALBUMIN 3.8 12/14/2014 1037   AST 24 06/24/2017 0900   AST 20 12/14/2014 1037   ALT 14 06/24/2017 0900   ALT 11 (L) 12/14/2014 1037   ALKPHOS 55 06/24/2017 0900   ALKPHOS 138 (H) 12/14/2014 1037   BILITOT 0.5 06/24/2017 0900   BILITOT 0.5 12/14/2014 1037   GFRNONAA 52 (L) 06/24/2017 0900    GFRNONAA 59 (L) 12/14/2014 1037   GFRAA 60 (L) 06/24/2017 0900   GFRAA >60 12/14/2014 1037    No results found for: SPEP, UPEP  Lab Results  Component Value Date   WBC 5.0 06/24/2017   NEUTROABS 1.7 06/24/2017   HGB 14.1 06/24/2017   HCT 41.5 06/24/2017   MCV 92.4 06/24/2017   PLT 239 06/24/2017      Chemistry      Component Value Date/Time   NA 132 (L) 06/24/2017 0900   NA 140 12/14/2014 1037   K 4.0 06/24/2017 0900   K 4.4 12/14/2014 1037   CL 100 (L) 06/24/2017 0900   CL 106 12/14/2014 1037   CO2 24 06/24/2017 0900   CO2 26 12/14/2014 1037   BUN 15 06/24/2017 0900   BUN 12 12/14/2014 1037   CREATININE 1.04 (H) 06/24/2017 0900   CREATININE 0.97 12/14/2014 1037   GLU 84 03/16/2014      Component Value Date/Time   CALCIUM 9.2 06/24/2017 0900   CALCIUM 9.2 12/14/2014 1037   ALKPHOS 55 06/24/2017 0900   ALKPHOS 138 (H) 12/14/2014 1037   AST 24 06/24/2017 0900   AST 20 12/14/2014 1037   ALT 14 06/24/2017 0900   ALT 11 (L) 12/14/2014 1037   BILITOT 0.5 06/24/2017 0900   BILITOT 0.5 12/14/2014 1037     IMPRESSION: 1. Status post hysterectomy, without recurrent or metastatic disease. 2.  Possible constipation. 3.  Tiny hiatal hernia. 4. Hepatic hemangiomas.  5. Cystic lesion within the right-side of the vagina is similar over prior exams and may represent a Bartholin's gland cyst.   Electronically Signed   By: Abigail Miyamoto M.D.   On: 03/28/2017 12:09 Results for LOVELEE, FORNER (MRN [id number]) as of 04/24/2017 09:53  Ref. Range 12/08/2014 14:59 03/14/2015 15:13 06/08/2015 11:54 07/20/2015 11:43 09/13/2015 10:30 12/14/2015 10:02 01/04/2016 13:19 02/20/2016 09:58 03/21/2016 08:57 05/16/2016 11:14 06/13/2016 09:51 06/27/2016 09:50 08/15/2016 08:55 09/24/2016 09:03 10/22/2016 08:26 11/05/2016 08:28 11/26/2016 08:28 01/21/2017 09:13 02/25/2017 08:19 04/01/2017 08:40  CA 125 Latest Ref Range: 0.0 - 38.1 U/mL  2,354.0 (H) 9.8 14.9 14.5 17.8 24.4 34.1 47.5 (H) 31.1 37.5 44.8 (H) 50.4 (H)  64.5 (H) 101.7 (H) 18.2 13.0 12.6 12.8    Cancer Antigen (CA) 125 Latest Ref Range: 0.0 - 38.1 U/mL                   13.3 14.9    RADIOGRAPHIC STUDIES: I have personally reviewed the radiological images as listed and agreed with the findings in the report. No results found.   ASSESSMENT & PLAN:  Ovary cancer, unspecified laterality ([place]) Recurrent  Platinum refractory ovarian cancer [dec 2017] August 10 CT scan- improvement/resolution of the pelvic peritoneal nodularity. Oct 24th 2018- Gynecological exam with Dr. Theora Gianotti- resolution of the palpable metastatic disease. Currently on Avastin maintenance.  No concerns for clinical progression- Ca 125-slightly rising.   # HOLD Avastin today- tolerating well except for elevated blood pressures; pt concerns.[see plan below]   # left hip pain-worsening; awaiting MRI- recommended PT  # Elevated Blood pressure- rechecked again 120/80s; start taking bp meds if systolic > 584/YBNLWHKNZ > 90.  but hold off Avastin today; plan treatment in 1 week  # folow up in 4 weeks/Avastin/UA/ labs. NO Avasin today; Avastin only in 1 week    Orders Placed This Encounter  Procedures  . CA 125    Standing Status:   Future    Number of Occurrences:   1    Standing Expiration Date:   06/24/2018   All questions were answered. The patient knows to call the clinic with any problems, questions or concerns.   Beckey Rutter, NP 05/13/17 10:19 AM    Cammie Sickle, MD 06/24/2017 12:38 PM

## 2017-06-24 NOTE — Assessment & Plan Note (Addendum)
Recurrent  Platinum refractory ovarian cancer [dec 2017] August 10 CT scan- improvement/resolution of the pelvic peritoneal nodularity. Oct 24th 2018- Gynecological exam with Dr. Theora Gianotti- resolution of the palpable metastatic disease. Currently on Avastin maintenance.  No concerns for clinical progression- Ca 125-slightly rising.   # HOLD Avastin today- tolerating well except for elevated blood pressures; pt concerns.[see plan below]   # left hip pain-worsening; awaiting MRI- recommended PT  # Elevated Blood pressure- rechecked again 120/80s; start taking bp meds if systolic > 352/YELYHTMBP > 90.  but hold off Avastin today; plan treatment in 1 week  # folow up in 4 weeks/Avastin/UA/ labs. NO Avasin today; Avastin only in 1 week

## 2017-06-24 NOTE — Progress Notes (Signed)
Patient requesting RF on emla cream today. (rx renewal sent to patient's pharmacy). Pt started new rx for lisinopril hydrocholothiazide 20-25 last Friday. She has been recording her BPs at home. Yesterday her bp was 104/70. Pt elected not to take her bp medication yesterday am due to dizziness. She reports 2-3 loose stools over the past few days, but she is taking her miralax as directed.

## 2017-06-25 ENCOUNTER — Other Ambulatory Visit: Payer: Self-pay | Admitting: Family Medicine

## 2017-06-25 DIAGNOSIS — E039 Hypothyroidism, unspecified: Secondary | ICD-10-CM

## 2017-06-25 LAB — CA 125: CANCER ANTIGEN (CA) 125: 13.9 U/mL (ref 0.0–38.1)

## 2017-07-01 ENCOUNTER — Other Ambulatory Visit: Payer: Self-pay | Admitting: Internal Medicine

## 2017-07-01 ENCOUNTER — Inpatient Hospital Stay: Payer: PPO

## 2017-07-01 ENCOUNTER — Other Ambulatory Visit: Payer: Self-pay

## 2017-07-01 VITALS — BP 135/83 | HR 83 | Temp 97.5°F | Resp 20

## 2017-07-01 DIAGNOSIS — C569 Malignant neoplasm of unspecified ovary: Secondary | ICD-10-CM

## 2017-07-01 DIAGNOSIS — Z5111 Encounter for antineoplastic chemotherapy: Secondary | ICD-10-CM | POA: Diagnosis not present

## 2017-07-01 MED ORDER — BEVACIZUMAB CHEMO INJECTION 400 MG/16ML
15.0000 mg/kg | Freq: Once | INTRAVENOUS | Status: AC
  Administered 2017-07-01: 1300 mg via INTRAVENOUS
  Filled 2017-07-01: qty 48

## 2017-07-01 MED ORDER — SODIUM CHLORIDE 0.9 % IV SOLN
Freq: Once | INTRAVENOUS | Status: AC
  Administered 2017-07-01: 14:00:00 via INTRAVENOUS
  Filled 2017-07-01: qty 1000

## 2017-07-01 MED ORDER — SODIUM CHLORIDE 0.9% FLUSH
10.0000 mL | INTRAVENOUS | Status: DC | PRN
  Administered 2017-07-01: 10 mL via INTRAVENOUS
  Filled 2017-07-01: qty 10

## 2017-07-01 MED ORDER — HEPARIN SOD (PORK) LOCK FLUSH 100 UNIT/ML IV SOLN
500.0000 [IU] | Freq: Once | INTRAVENOUS | Status: AC
  Administered 2017-07-01: 500 [IU] via INTRAVENOUS

## 2017-07-02 ENCOUNTER — Ambulatory Visit: Payer: PPO

## 2017-07-08 ENCOUNTER — Other Ambulatory Visit: Payer: Self-pay | Admitting: Internal Medicine

## 2017-07-08 ENCOUNTER — Ambulatory Visit
Admission: RE | Admit: 2017-07-08 | Discharge: 2017-07-08 | Disposition: A | Discharge status: Home / Self Care | Payer: PPO | Source: Ambulatory Visit | Attending: Family Medicine | Admitting: Family Medicine

## 2017-07-08 ENCOUNTER — Other Ambulatory Visit: Payer: Self-pay | Admitting: Family Medicine

## 2017-07-08 DIAGNOSIS — M47816 Spondylosis without myelopathy or radiculopathy, lumbar region: Secondary | ICD-10-CM | POA: Diagnosis not present

## 2017-07-08 DIAGNOSIS — C569 Malignant neoplasm of unspecified ovary: Secondary | ICD-10-CM | POA: Insufficient documentation

## 2017-07-08 DIAGNOSIS — Z1231 Encounter for screening mammogram for malignant neoplasm of breast: Secondary | ICD-10-CM | POA: Diagnosis not present

## 2017-07-08 DIAGNOSIS — N898 Other specified noninflammatory disorders of vagina: Secondary | ICD-10-CM | POA: Diagnosis not present

## 2017-07-08 HISTORY — DX: Personal history of irradiation: Z92.3

## 2017-07-08 HISTORY — DX: Personal history of antineoplastic chemotherapy: Z92.21

## 2017-07-08 HISTORY — DX: Malignant neoplasm of unspecified site of unspecified female breast: C50.919

## 2017-07-08 NOTE — Telephone Encounter (Signed)
Please review. Thanks!  

## 2017-07-08 NOTE — Telephone Encounter (Signed)
Lynndyl faxed refill request for the following medications:  Cholecalciferol (VITAMIN D) 2000 units tablet  Please advise. Thanks TNP

## 2017-07-09 ENCOUNTER — Telehealth: Payer: Self-pay | Admitting: Internal Medicine

## 2017-07-09 ENCOUNTER — Ambulatory Visit
Admission: RE | Admit: 2017-07-09 | Discharge: 2017-07-09 | Disposition: A | Discharge status: Home / Self Care | Payer: PPO | Source: Ambulatory Visit | Attending: Nurse Practitioner | Admitting: Nurse Practitioner

## 2017-07-09 DIAGNOSIS — M5126 Other intervertebral disc displacement, lumbar region: Secondary | ICD-10-CM | POA: Diagnosis not present

## 2017-07-09 DIAGNOSIS — C569 Malignant neoplasm of unspecified ovary: Secondary | ICD-10-CM | POA: Diagnosis not present

## 2017-07-09 DIAGNOSIS — Z1231 Encounter for screening mammogram for malignant neoplasm of breast: Secondary | ICD-10-CM | POA: Diagnosis not present

## 2017-07-09 MED ORDER — GADOBENATE DIMEGLUMINE 529 MG/ML IV SOLN
17.0000 mL | Freq: Once | INTRAVENOUS | Status: AC | PRN
  Administered 2017-07-09: 17 mL via INTRAVENOUS

## 2017-07-09 MED ORDER — VITAMIN D 50 MCG (2000 UT) PO TABS: 2000.0000 [IU] | ORAL_TABLET | Freq: Every day | ORAL | 12 refills | Status: AC

## 2017-07-09 NOTE — Telephone Encounter (Signed)
Jamie Conner- Please inform patient that MRI of the pelvis is negative for any cancer; and does not explain her hip pain. possible arthritis.   If hip pain/back pain is worse/not getting any better-I would recommend lumbar MRI.  # Please let me know of what pt decided

## 2017-07-10 ENCOUNTER — Telehealth: Payer: Self-pay | Admitting: Nurse Practitioner

## 2017-07-10 NOTE — Telephone Encounter (Signed)
Lauren spoke with patient- see documentation

## 2017-07-10 NOTE — Telephone Encounter (Signed)
Called patient and discussed results of the MRI. No questions at this time.

## 2017-07-22 ENCOUNTER — Inpatient Hospital Stay: Payer: PPO | Attending: Internal Medicine

## 2017-07-22 ENCOUNTER — Inpatient Hospital Stay: Payer: PPO

## 2017-07-22 ENCOUNTER — Inpatient Hospital Stay (HOSPITAL_BASED_OUTPATIENT_CLINIC_OR_DEPARTMENT_OTHER): Payer: PPO | Admitting: Internal Medicine

## 2017-07-22 ENCOUNTER — Encounter: Payer: Self-pay | Admitting: Internal Medicine

## 2017-07-22 VITALS — BP 120/75 | HR 81 | Temp 96.4°F | Resp 16 | Wt 190.8 lb

## 2017-07-22 DIAGNOSIS — E785 Hyperlipidemia, unspecified: Secondary | ICD-10-CM | POA: Diagnosis not present

## 2017-07-22 DIAGNOSIS — Z90722 Acquired absence of ovaries, bilateral: Secondary | ICD-10-CM | POA: Insufficient documentation

## 2017-07-22 DIAGNOSIS — Z923 Personal history of irradiation: Secondary | ICD-10-CM | POA: Diagnosis not present

## 2017-07-22 DIAGNOSIS — I4891 Unspecified atrial fibrillation: Secondary | ICD-10-CM | POA: Insufficient documentation

## 2017-07-22 DIAGNOSIS — Z90710 Acquired absence of both cervix and uterus: Secondary | ICD-10-CM | POA: Insufficient documentation

## 2017-07-22 DIAGNOSIS — K219 Gastro-esophageal reflux disease without esophagitis: Secondary | ICD-10-CM | POA: Diagnosis not present

## 2017-07-22 DIAGNOSIS — Z5112 Encounter for antineoplastic immunotherapy: Secondary | ICD-10-CM | POA: Diagnosis not present

## 2017-07-22 DIAGNOSIS — Z800 Family history of malignant neoplasm of digestive organs: Secondary | ICD-10-CM | POA: Diagnosis not present

## 2017-07-22 DIAGNOSIS — C569 Malignant neoplasm of unspecified ovary: Secondary | ICD-10-CM | POA: Insufficient documentation

## 2017-07-22 DIAGNOSIS — Z9221 Personal history of antineoplastic chemotherapy: Secondary | ICD-10-CM | POA: Insufficient documentation

## 2017-07-22 DIAGNOSIS — R030 Elevated blood-pressure reading, without diagnosis of hypertension: Secondary | ICD-10-CM

## 2017-07-22 DIAGNOSIS — C786 Secondary malignant neoplasm of retroperitoneum and peritoneum: Secondary | ICD-10-CM

## 2017-07-22 DIAGNOSIS — M25552 Pain in left hip: Secondary | ICD-10-CM

## 2017-07-22 DIAGNOSIS — Z803 Family history of malignant neoplasm of breast: Secondary | ICD-10-CM | POA: Insufficient documentation

## 2017-07-22 DIAGNOSIS — E039 Hypothyroidism, unspecified: Secondary | ICD-10-CM | POA: Diagnosis not present

## 2017-07-22 DIAGNOSIS — Z79899 Other long term (current) drug therapy: Secondary | ICD-10-CM | POA: Insufficient documentation

## 2017-07-22 LAB — URINALYSIS, COMPLETE (UACMP) WITH MICROSCOPIC
BILIRUBIN URINE: NEGATIVE
Bacteria, UA: NONE SEEN
Glucose, UA: NEGATIVE mg/dL
HGB URINE DIPSTICK: NEGATIVE
Ketones, ur: NEGATIVE mg/dL
Leukocytes, UA: NEGATIVE
NITRITE: NEGATIVE
PH: 6 (ref 5.0–8.0)
Protein, ur: NEGATIVE mg/dL
RBC / HPF: NONE SEEN RBC/hpf (ref 0–5)
SPECIFIC GRAVITY, URINE: 1.01 (ref 1.005–1.030)
Squamous Epithelial / LPF: NONE SEEN

## 2017-07-22 LAB — CBC WITH DIFFERENTIAL/PLATELET
BASOS ABS: 0 10*3/uL (ref 0–0.1)
BASOS PCT: 1 %
Eosinophils Absolute: 0.2 10*3/uL (ref 0–0.7)
Eosinophils Relative: 5 %
HEMATOCRIT: 41.6 % (ref 35.0–47.0)
HEMOGLOBIN: 14.1 g/dL (ref 12.0–16.0)
Lymphocytes Relative: 53 %
Lymphs Abs: 2.5 10*3/uL (ref 1.0–3.6)
MCH: 31.8 pg (ref 26.0–34.0)
MCHC: 34 g/dL (ref 32.0–36.0)
MCV: 93.6 fL (ref 80.0–100.0)
Monocytes Absolute: 0.5 10*3/uL (ref 0.2–0.9)
Monocytes Relative: 11 %
NEUTROS ABS: 1.4 10*3/uL (ref 1.4–6.5)
NEUTROS PCT: 30 %
Platelets: 240 10*3/uL (ref 150–440)
RBC: 4.45 MIL/uL (ref 3.80–5.20)
RDW: 14.3 % (ref 11.5–14.5)
WBC: 4.6 10*3/uL (ref 3.6–11.0)

## 2017-07-22 LAB — COMPREHENSIVE METABOLIC PANEL
ALK PHOS: 61 U/L (ref 38–126)
ALT: 15 U/L (ref 14–54)
ANION GAP: 9 (ref 5–15)
AST: 28 U/L (ref 15–41)
Albumin: 3.7 g/dL (ref 3.5–5.0)
BILIRUBIN TOTAL: 0.6 mg/dL (ref 0.3–1.2)
BUN: 15 mg/dL (ref 6–20)
CALCIUM: 9.4 mg/dL (ref 8.9–10.3)
CO2: 25 mmol/L (ref 22–32)
Chloride: 106 mmol/L (ref 101–111)
Creatinine, Ser: 1.1 mg/dL — ABNORMAL HIGH (ref 0.44–1.00)
GFR calc non Af Amer: 48 mL/min — ABNORMAL LOW (ref >60)
GFR, EST AFRICAN AMERICAN: 56 mL/min — AB (ref >60)
Glucose, Bld: 114 mg/dL — ABNORMAL HIGH (ref 65–99)
Potassium: 3.9 mmol/L (ref 3.5–5.1)
SODIUM: 140 mmol/L (ref 135–145)
TOTAL PROTEIN: 6.9 g/dL (ref 6.5–8.1)

## 2017-07-22 MED ORDER — LISINOPRIL 10 MG PO TABS: 10.0000 mg | ORAL_TABLET | Freq: Every day | ORAL | 4 refills | Status: DC

## 2017-07-22 MED ORDER — HEPARIN SOD (PORK) LOCK FLUSH 100 UNIT/ML IV SOLN
500.0000 [IU] | Freq: Once | INTRAVENOUS | Status: AC
  Administered 2017-07-22: 500 [IU] via INTRAVENOUS
  Filled 2017-07-22: qty 5

## 2017-07-22 MED ORDER — BEVACIZUMAB CHEMO INJECTION 400 MG/16ML
15.0000 mg/kg | Freq: Once | INTRAVENOUS | Status: AC
  Administered 2017-07-22: 1300 mg via INTRAVENOUS
  Filled 2017-07-22: qty 48

## 2017-07-22 MED ORDER — SODIUM CHLORIDE 0.9% FLUSH
10.0000 mL | Freq: Once | INTRAVENOUS | Status: AC
  Administered 2017-07-22: 10 mL via INTRAVENOUS
  Filled 2017-07-22: qty 10

## 2017-07-22 MED ORDER — SODIUM CHLORIDE 0.9 % IV SOLN
Freq: Once | INTRAVENOUS | Status: AC
Start: 2017-07-22 — End: 2017-07-22
  Administered 2017-07-22: 10:00:00 via INTRAVENOUS
  Filled 2017-07-22: qty 1000

## 2017-07-22 NOTE — Progress Notes (Signed)
[place] OFFICE PROGRESS NOTE  Patient Care Team: Jerrol Banana., MD as PCP - General (Family Medicine) Gillis Ends, MD as Referring Physician (Obstetrics and Gynecology) Bary Castilla Forest Gleason, MD as Consulting Physician (General Surgery)  Ovarian cancer [facility])   Staging form: Ovary, AJCC 7th Edition     Clinical: Stage IIIC (T3c, N0, M0) - Unsigned   Oncology History   # April- MAY 2016- OVARIAN CANCER STAGE IIIC; CA-125 +2300+;  s/p OPTIMAL DEBULKING SURGERY   # MAY 2016[name] [finished in Sep 19th 2017]  # MAY CT 2017- RECURRENT/Peritoneal carcinomatosis/pelvic implant ~1.2cm/Ca-125-34;   # July 3rd- CARBO-DOXIL q 4W [with neulasta]; CT May 11 2016- slight increase peritoneal / stable nodules. Cont chemo [finished DEC 2017]. DEC 28th 2017 CT- Increase in peritoneal deposits; increasing Ca 125 [60s];  # Jan 2018- carbo-Taxol-Avastin x6 cycles; Aug 2018- CR; Aug 2018- start Avastin Maintenance  # G-1-2 hand foot syndrome-   # LEFT BREAST CA s/p Lumpec & RT s/p TAM   # Afib [cardiology]; JUNE 2017- MUGA 62%  MOLECULAR TESTING- ATM genetic mutation; TUMOR BRCA- NEG; Myriad genomic instability- NEGATIVE     Ovary cancer, unspecified laterality (Home)    INTERVAL HISTORY:  76 year old female patient with above history of ovarian cancer recurrent Platinum Refractory ovarian cancer [dec 2017]. Currently on maintenance Avastin is here for follow-up; and also to review the results of the pelvic and lumbar MRI.  Patient's blood pressures have been in the range of systolic 409W; diastolic around mid 11B. She brings her log of her blood pressures.  She is currently on no blood pressure medications.  She continues to have left hip pain.  Not improving.  Denies any headaches.  Denies blood per nose.   REVIEW OF SYSTEMS:  A complete 10 point review of system is done which is negative except mentioned above/history of present illness.   PAST  MEDICAL HISTORY :  Past Medical History:  Diagnosis Date  . Atrial fibrillation ([place])   . Breast cancer ([place]) 1998  . Cancer of breast ([place]) 1998   Left/radiation  . CINV (chemotherapy-induced nausea and vomiting)   . GERD (gastroesophageal reflux disease)   . Hyperlipidemia   . Hypothyroidism   . Mitral insufficiency   . Ovarian cancer ([place])    s/p BSO optimal tumor debulking May 2016/chemo  . Personal history of chemotherapy since 2016   ovarian cancer  . Personal history of radiation therapy 1998  . PVC (premature ventricular contraction)     PAST SURGICAL HISTORY :   Past Surgical History:  Procedure Laterality Date  . ABDOMINAL HYSTERECTOMY    . BILATERAL SALPINGOOPHORECTOMY  May 2016   with optimal tumor debulking   . BREAST LUMPECTOMY Left 1998  . BREAST SURGERY     Left  . CESAREAN SECTION     x2  . CHOLECYSTECTOMY    . COLONOSCOPY  2006  . DEBULKING N/A 12/22/2014   Procedure: DEBULKING;  Surgeon: Gillis Ends, MD;  Location: [facility];  Service: Gynecology;  Laterality: N/A;  . LAPAROTOMY N/A 12/22/2014   Procedure: EXPLORATORY LAPAROTOMY;  Surgeon: Robert Bellow, MD;  Location: [facility] OR[facility];  Service: General;  Laterality: N/A;  . LAPAROTOMY WITH STAGING N/A 12/22/2014   Procedure: LAPAROTOMY WITH STAGING;  Surgeon: Gillis Ends, MD;  Location: [facility] OR[facility];  Service: Gynecology;  Laterality: N/A;  . PERIPHERAL VASCULAR CATHETERIZATION Left 12/22/2014   Procedure: PORTA CATH INSERTION;  Surgeon: Forest Gleason  Bary Castilla, MD;  Location: [facility] OR[facility];  Service: General;  Laterality: Left;  . PORTACATH PLACEMENT Right 12/22/2014   Procedure: INSERTION PORT-A-CATH;  Surgeon: Robert Bellow, MD;  Location: [facility];  Service: General;  Laterality: Right;  . SALPINGOOPHORECTOMY Bilateral 12/22/2014   Procedure: SALPINGO OOPHORECTOMY;  Surgeon: Gillis Ends, MD;  Location: [facility];  Service: Gynecology;  Laterality: Bilateral;  . UPPER GI ENDOSCOPY  09/25/04    hiatus hernia, schatzki ring and a single gastric polyp    FAMILY HISTORY :   Family History  Problem Relation Age of Onset  . Cancer Mother        breast, throat, and stomach  . Breast cancer Mother   . Heart disease Father   . Pulmonary embolism Sister   . Cancer Brother 60       angiosarcoma of the chest; carcinoid small intestinal tumor  . Hypertension Brother   . Cancer Maternal Aunt        breast cancer  . Cancer Maternal Grandfather 68       pancreatic    SOCIAL HISTORY:   Social History   Tobacco Use  . Smoking status: Never Smoker  . Smokeless tobacco: Never Used  Substance Use Topics  . Alcohol use: No  . Drug use: No    ALLERGIES:  is allergic to carafate [sucralfate] and sulfa antibiotics.  MEDICATIONS:  Current Outpatient Medications  Medication Sig Dispense Refill  . acetaminophen (TYLENOL) 500 MG chewable tablet Chew 500 mg by mouth every 6 (six) hours as needed for pain.    . Cholecalciferol (VITAMIN D) 2000 units tablet Take 1 tablet (2,000 Units total) by mouth daily. 30 tablet 12  . Docosanol (ABREVA EX) Apply 1 application topically as needed. Fever blisters    . lactulose (CHRONULAC) 10 GM/15ML solution TAKE 15MLS (10G TOTAL) BY MOUTH THREE TIMES A DAY AS DIRECTED BY PHYSICIAN. 240 mL 1  . levothyroxine (SYNTHROID, LEVOTHROID) 50 MCG tablet TAKE 1 TABLET EVERY DAY ON AN EMPTY STOMACH WITH A GLASS OF WATER AT LEAST 30-60 MINUTES BEFORE BREAKFAST 30 tablet 12  . lidocaine-prilocaine (EMLA) cream Apply to port area 30-45 mins prior to chemo. 30 g 5  . lisinopril-hydrochlorothiazide (PRINZIDE,ZESTORETIC) 20-25 MG tablet Take 1 tablet by mouth daily. 30 tablet 0  . LORazepam (ATIVAN) 0.5 MG tablet Take 0.5 tablets (0.25 mg total) by mouth at bedtime. 30 tablet 4  . Lysine 500 MG TABS Take 1 tablet by mouth daily as needed.     . Multiple Vitamin (MULTIVITAMIN) capsule Take 1 capsule by mouth daily.    . ondansetron (ZOFRAN) 4 MG tablet Take 1 tablet (4 mg  total) by mouth every 8 (eight) hours as needed for nausea or vomiting. 30 tablet 3  . pantoprazole (PROTONIX) 40 MG tablet TAKE 1 TABLET BY MOUTH DAILY 30 tablet 11  . polyethylene glycol powder (GLYCOLAX/MIRALAX) powder TAKE 17GM (LINE INSIDE CAP) IN EIGHT OUNCES OF WATER OR OTHER LIQUID DAILY 255 g 2  . prochlorperazine (COMPAZINE) 10 MG tablet Take 1 tablet (10 mg total) by mouth every 6 (six) hours as needed for nausea or vomiting. 30 tablet 0  . propafenone (RYTHMOL) 225 MG tablet Take 225 mg by mouth 2 (two) times daily.    . traMADol (ULTRAM) 50 MG tablet Take 1 tablet (50 mg total) by mouth every 6 (six) hours as needed. 30 tablet 0  . lisinopril (PRINIVIL,ZESTRIL) 10 MG tablet Take 1 tablet (10 mg total) by mouth daily. [place]  tablet 4   No current facility-administered medications for this visit.    Facility-Administered Medications Ordered in Other Visits  Medication Dose Route Frequency Provider Last Rate Last Dose  . sodium chloride 0.9 % injection 10 mL  10 mL Intracatheter PRN Forest Gleason, MD   10 mL at 02/07/15 0930    PHYSICAL EXAMINATION: ECOG PERFORMANCE STATUS: 0 - Asymptomatic  BP 120/75 (BP Location: Left Arm, Patient Position: Sitting)   Pulse 81   Temp (!) 96.4 F (35.8 C) (Tympanic)   Resp 16   Wt 190 lb 12.8 oz (86.5 kg)   BMI 32.75 kg/m   Filed Weights   07/22/17 0837  Weight: 190 lb 12.8 oz (86.5 kg)    GENERAL: Well-nourished well-developed; Alert, no distress and comfort. Accompanied by her husband. EYES: no pallor or icterus OROPHARYNX: no thrush or ulceration; good dentition  NECK: supple, no masses felt LYMPH:  no palpable lymphadenopathy in the cervical, axillary or inguinal regions.  LUNGS: clear to auscultation and  No wheeze or crackles HEART/CVS: regular rate & rhythm and no murmurs; No lower extremity edema  ABDOMEN:abdomen soft, non-tender and normal bowel sounds Musculoskeletal:no cyanosis of digits and no clubbing. No redness,  temperature warm and well perfused.  PSYCH: alert & oriented x 3 with fluent speech NEURO: no focal motor/sensory deficits SKIN:  Normal. No rash noted.   LABORATORY DATA:  I have reviewed the data as listed    Component Value Date/Time   NA 140 07/22/2017 0815   NA 140 12/14/2014 1037   K 3.9 07/22/2017 0815   K 4.4 12/14/2014 1037   CL 106 07/22/2017 0815   CL 106 12/14/2014 1037   CO2 25 07/22/2017 0815   CO2 26 12/14/2014 1037   GLUCOSE 114 (H) 07/22/2017 0815   GLUCOSE 87 12/14/2014 1037   BUN 15 07/22/2017 0815   BUN 12 12/14/2014 1037   CREATININE 1.10 (H) 07/22/2017 0815   CREATININE 0.97 12/14/2014 1037   CALCIUM 9.4 07/22/2017 0815   CALCIUM 9.2 12/14/2014 1037   PROT 6.9 07/22/2017 0815   PROT 7.3 12/14/2014 1037   ALBUMIN 3.7 07/22/2017 0815   ALBUMIN 3.8 12/14/2014 1037   AST 28 07/22/2017 0815   AST 20 12/14/2014 1037   ALT 15 07/22/2017 0815   ALT 11 (L) 12/14/2014 1037   ALKPHOS 61 07/22/2017 0815   ALKPHOS 138 (H) 12/14/2014 1037   BILITOT 0.6 07/22/2017 0815   BILITOT 0.5 12/14/2014 1037   GFRNONAA 48 (L) 07/22/2017 0815   GFRNONAA 59 (L) 12/14/2014 1037   GFRAA 56 (L) 07/22/2017 0815   GFRAA >60 12/14/2014 1037    No results found for: SPEP, UPEP  Lab Results  Component Value Date   WBC 4.6 07/22/2017   NEUTROABS 1.4 07/22/2017   HGB 14.1 07/22/2017   HCT 41.6 07/22/2017   MCV 93.6 07/22/2017   PLT 240 07/22/2017      Chemistry      Component Value Date/Time   NA 140 07/22/2017 0815   NA 140 12/14/2014 1037   K 3.9 07/22/2017 0815   K 4.4 12/14/2014 1037   CL 106 07/22/2017 0815   CL 106 12/14/2014 1037   CO2 25 07/22/2017 0815   CO2 26 12/14/2014 1037   BUN 15 07/22/2017 0815   BUN 12 12/14/2014 1037   CREATININE 1.10 (H) 07/22/2017 0815   CREATININE 0.97 12/14/2014 1037   GLU 84 03/16/2014      Component Value Date/Time   CALCIUM  9.4 07/22/2017 0815   CALCIUM 9.2 12/14/2014 1037   ALKPHOS 61 07/22/2017 0815   ALKPHOS 138  (H) 12/14/2014 1037   AST 28 07/22/2017 0815   AST 20 12/14/2014 1037   ALT 15 07/22/2017 0815   ALT 11 (L) 12/14/2014 1037   BILITOT 0.6 07/22/2017 0815   BILITOT 0.5 12/14/2014 1037     IMPRESSION: 1. Status post hysterectomy, without recurrent or metastatic disease. 2.  Possible constipation. 3.  Tiny hiatal hernia. 4. Hepatic hemangiomas.  5. Cystic lesion within the right-side of the vagina is similar over prior exams and may represent a Bartholin's gland cyst.   Electronically Signed   By: Abigail Miyamoto M.D.   On: 03/28/2017 12:09 Results for RENESHA, LIZAMA (MRN [id number]) as of 04/24/2017 09:53  Ref. Range 12/08/2014 14:59 03/14/2015 15:13 06/08/2015 11:54 07/20/2015 11:43 09/13/2015 10:30 12/14/2015 10:02 01/04/2016 13:19 02/20/2016 09:58 03/21/2016 08:57 05/16/2016 11:14 06/13/2016 09:51 06/27/2016 09:50 08/15/2016 08:55 09/24/2016 09:03 10/22/2016 08:26 11/05/2016 08:28 11/26/2016 08:28 01/21/2017 09:13 02/25/2017 08:19 04/01/2017 08:40  CA 125 Latest Ref Range: 0.0 - 38.1 U/mL 2,354.0 (H) 9.8 14.9 14.5 17.8 24.4 34.1 47.5 (H) 31.1 37.5 44.8 (H) 50.4 (H) 64.5 (H) 101.7 (H) 18.2 13.0 12.6 12.8    Cancer Antigen (CA) 125 Latest Ref Range: 0.0 - 38.1 U/mL                   13.3 14.9    RADIOGRAPHIC STUDIES: I have personally reviewed the radiological images as listed and agreed with the findings in the report. No results found.   ASSESSMENT & PLAN:  Ovary cancer, unspecified laterality ([place]) Recurrent  Platinum refractory ovarian cancer [dec 2017] August 10 CT scan- improvement/resolution of the pelvic peritoneal nodularity. Oct 24th 2018- Gynecological exam with Dr. Theora Gianotti- resolution of the palpable metastatic disease. Currently on Avastin maintenance.  No concerns for clinical progression- Ca 125-slightly rising.   # Continue maintenance avastin- tolerating well; except slightly elevated HNT [see discussion below]  # left hip pain- not resolved; MRI-lumbar/hip pain-Neg for mets; ?  arthritis- recommended PT  # Elevated Blood pressure- rechecked again 120/80s; Blood pressures at home-  Systolic~ 935/[id number] ~30-09. Discussed re: starting on lower dose- lisinpril 10 mg/day.  Keep a log of blood pressure at home; and to bring [place] the log.  # follow up in Jan 2nd/avastin UA/labs- ca-125. Referral to PT.    Orders Placed This Encounter  Procedures  . CBC with Differential    Standing Status:   Future    Standing Expiration Date:   07/22/2018  . Comprehensive metabolic panel    Standing Status:   Future    Standing Expiration Date:   07/22/2018  . CA 125    Standing Status:   Future    Standing Expiration Date:   07/22/2018  . Urinalysis, Complete w Microscopic    Standing Status:   Future    Standing Expiration Date:   07/22/2018  . Ambulatory referral to Physical Therapy    Referral Priority:   Routine    Referral Type:   Physical Medicine    Referral Reason:   Specialty Services Required    Requested Specialty:   Physical Therapy    Number of Visits Requested:   1   All questions were answered. The patient knows to call the clinic with any problems, questions or concerns.   Beckey Rutter, NP 05/13/17 10:19 AM    Cammie Sickle, MD 07/22/2017 3:05 PM

## 2017-07-22 NOTE — Assessment & Plan Note (Addendum)
Recurrent  Platinum refractory ovarian cancer [dec 2017] August 10 CT scan- improvement/resolution of the pelvic peritoneal nodularity. Oct 24th 2018- Gynecological exam with Dr. Theora Gianotti- resolution of the palpable metastatic disease. Currently on Avastin maintenance.  No concerns for clinical progression- Ca 125-slightly rising.   # Continue maintenance avastin- tolerating well; except slightly elevated HNT [see discussion below]  # left hip pain- not resolved; MRI-lumbar/hip pain-Neg for mets; ? arthritis- recommended PT  # Elevated Blood pressure- rechecked again 120/80s; Blood pressures at home-  Systolic~ 462/[id number] ~65-79. Discussed re: starting on lower dose- lisinpril 10 mg/day.  Keep a log of blood pressure at home; and to bring [place] the log.  # follow up in Jan 2nd/avastin UA/labs- ca-125. Referral to PT.

## 2017-08-07 DIAGNOSIS — I480 Paroxysmal atrial fibrillation: Secondary | ICD-10-CM | POA: Diagnosis not present

## 2017-08-07 DIAGNOSIS — I340 Nonrheumatic mitral (valve) insufficiency: Secondary | ICD-10-CM | POA: Diagnosis not present

## 2017-08-07 DIAGNOSIS — I10 Essential (primary) hypertension: Secondary | ICD-10-CM | POA: Diagnosis not present

## 2017-08-07 DIAGNOSIS — E782 Mixed hyperlipidemia: Secondary | ICD-10-CM | POA: Diagnosis not present

## 2017-08-07 DIAGNOSIS — R002 Palpitations: Secondary | ICD-10-CM | POA: Diagnosis not present

## 2017-08-09 DIAGNOSIS — H16223 Keratoconjunctivitis sicca, not specified as Sjogren's, bilateral: Secondary | ICD-10-CM | POA: Diagnosis not present

## 2017-08-21 ENCOUNTER — Inpatient Hospital Stay: Payer: PPO | Attending: Internal Medicine

## 2017-08-21 ENCOUNTER — Encounter: Payer: Self-pay | Admitting: Internal Medicine

## 2017-08-21 ENCOUNTER — Inpatient Hospital Stay: Payer: PPO

## 2017-08-21 ENCOUNTER — Inpatient Hospital Stay (HOSPITAL_BASED_OUTPATIENT_CLINIC_OR_DEPARTMENT_OTHER): Payer: PPO | Admitting: Internal Medicine

## 2017-08-21 VITALS — BP 138/71 | HR 66 | Resp 20

## 2017-08-21 VITALS — BP 146/86 | HR 64 | Temp 97.6°F | Resp 16 | Wt 192.0 lb

## 2017-08-21 DIAGNOSIS — I4891 Unspecified atrial fibrillation: Secondary | ICD-10-CM | POA: Diagnosis not present

## 2017-08-21 DIAGNOSIS — Z853 Personal history of malignant neoplasm of breast: Secondary | ICD-10-CM

## 2017-08-21 DIAGNOSIS — Z90710 Acquired absence of both cervix and uterus: Secondary | ICD-10-CM

## 2017-08-21 DIAGNOSIS — M25552 Pain in left hip: Secondary | ICD-10-CM | POA: Diagnosis not present

## 2017-08-21 DIAGNOSIS — Z79899 Other long term (current) drug therapy: Secondary | ICD-10-CM

## 2017-08-21 DIAGNOSIS — E785 Hyperlipidemia, unspecified: Secondary | ICD-10-CM | POA: Diagnosis not present

## 2017-08-21 DIAGNOSIS — M1990 Unspecified osteoarthritis, unspecified site: Secondary | ICD-10-CM | POA: Diagnosis not present

## 2017-08-21 DIAGNOSIS — Z5112 Encounter for antineoplastic immunotherapy: Secondary | ICD-10-CM | POA: Insufficient documentation

## 2017-08-21 DIAGNOSIS — Z90722 Acquired absence of ovaries, bilateral: Secondary | ICD-10-CM

## 2017-08-21 DIAGNOSIS — C786 Secondary malignant neoplasm of retroperitoneum and peritoneum: Secondary | ICD-10-CM

## 2017-08-21 DIAGNOSIS — N898 Other specified noninflammatory disorders of vagina: Secondary | ICD-10-CM

## 2017-08-21 DIAGNOSIS — E039 Hypothyroidism, unspecified: Secondary | ICD-10-CM

## 2017-08-21 DIAGNOSIS — Z923 Personal history of irradiation: Secondary | ICD-10-CM

## 2017-08-21 DIAGNOSIS — K449 Diaphragmatic hernia without obstruction or gangrene: Secondary | ICD-10-CM | POA: Insufficient documentation

## 2017-08-21 DIAGNOSIS — Z9221 Personal history of antineoplastic chemotherapy: Secondary | ICD-10-CM | POA: Diagnosis not present

## 2017-08-21 DIAGNOSIS — I10 Essential (primary) hypertension: Secondary | ICD-10-CM | POA: Diagnosis not present

## 2017-08-21 DIAGNOSIS — D1803 Hemangioma of intra-abdominal structures: Secondary | ICD-10-CM | POA: Diagnosis not present

## 2017-08-21 DIAGNOSIS — Z803 Family history of malignant neoplasm of breast: Secondary | ICD-10-CM | POA: Diagnosis not present

## 2017-08-21 DIAGNOSIS — C569 Malignant neoplasm of unspecified ovary: Secondary | ICD-10-CM

## 2017-08-21 DIAGNOSIS — Z800 Family history of malignant neoplasm of digestive organs: Secondary | ICD-10-CM | POA: Diagnosis not present

## 2017-08-21 DIAGNOSIS — K219 Gastro-esophageal reflux disease without esophagitis: Secondary | ICD-10-CM | POA: Diagnosis not present

## 2017-08-21 DIAGNOSIS — N750 Cyst of Bartholin's gland: Secondary | ICD-10-CM | POA: Diagnosis not present

## 2017-08-21 LAB — CBC WITH DIFFERENTIAL/PLATELET
BASOS PCT: 1 %
Basophils Absolute: 0.1 10*3/uL (ref 0–0.1)
Eosinophils Absolute: 0.3 10*3/uL (ref 0–0.7)
Eosinophils Relative: 5 %
HCT: 41.5 % (ref 35.0–47.0)
HEMOGLOBIN: 14 g/dL (ref 12.0–16.0)
Lymphocytes Relative: 51 %
Lymphs Abs: 3.4 10*3/uL (ref 1.0–3.6)
MCH: 31.8 pg (ref 26.0–34.0)
MCHC: 33.8 g/dL (ref 32.0–36.0)
MCV: 94.3 fL (ref 80.0–100.0)
MONO ABS: 0.7 10*3/uL (ref 0.2–0.9)
MONOS PCT: 11 %
NEUTROS ABS: 2.1 10*3/uL (ref 1.4–6.5)
NEUTROS PCT: 32 %
Platelets: 257 10*3/uL (ref 150–440)
RBC: 4.4 MIL/uL (ref 3.80–5.20)
RDW: 13.8 % (ref 11.5–14.5)
WBC: 6.5 10*3/uL (ref 3.6–11.0)

## 2017-08-21 LAB — COMPREHENSIVE METABOLIC PANEL
ALBUMIN: 3.5 g/dL (ref 3.5–5.0)
ALT: 15 U/L (ref 14–54)
ANION GAP: 8 (ref 5–15)
AST: 23 U/L (ref 15–41)
Alkaline Phosphatase: 65 U/L (ref 38–126)
BUN: 16 mg/dL (ref 6–20)
CALCIUM: 9.5 mg/dL (ref 8.9–10.3)
CHLORIDE: 101 mmol/L (ref 101–111)
CO2: 26 mmol/L (ref 22–32)
Creatinine, Ser: 1.09 mg/dL — ABNORMAL HIGH (ref 0.44–1.00)
GFR calc non Af Amer: 49 mL/min — ABNORMAL LOW (ref >60)
GFR, EST AFRICAN AMERICAN: 57 mL/min — AB (ref >60)
GLUCOSE: 80 mg/dL (ref 65–99)
Potassium: 4.5 mmol/L (ref 3.5–5.1)
SODIUM: 135 mmol/L (ref 135–145)
Total Bilirubin: 0.6 mg/dL (ref 0.3–1.2)
Total Protein: 6.8 g/dL (ref 6.5–8.1)

## 2017-08-21 LAB — URINALYSIS, COMPLETE (UACMP) WITH MICROSCOPIC
BACTERIA UA: NONE SEEN
BILIRUBIN URINE: NEGATIVE
Glucose, UA: NEGATIVE mg/dL
HGB URINE DIPSTICK: NEGATIVE
KETONES UR: NEGATIVE mg/dL
LEUKOCYTES UA: NEGATIVE
NITRITE: NEGATIVE
PROTEIN: NEGATIVE mg/dL
RBC / HPF: NONE SEEN RBC/hpf (ref 0–5)
SPECIFIC GRAVITY, URINE: 1.005 (ref 1.005–1.030)
pH: 6 (ref 5.0–8.0)

## 2017-08-21 MED ORDER — HEPARIN SOD (PORK) LOCK FLUSH 100 UNIT/ML IV SOLN
500.0000 [IU] | Freq: Once | INTRAVENOUS | Status: AC | PRN
  Administered 2017-08-21: 500 [IU]
  Filled 2017-08-21: qty 5

## 2017-08-21 MED ORDER — SODIUM CHLORIDE 0.9 % IV SOLN
Freq: Once | INTRAVENOUS | Status: AC
  Administered 2017-08-21: 13:00:00 via INTRAVENOUS
  Filled 2017-08-21: qty 1000

## 2017-08-21 MED ORDER — SODIUM CHLORIDE 0.9 % IV SOLN
15.0000 mg/kg | Freq: Once | INTRAVENOUS | Status: AC
  Administered 2017-08-21: 1300 mg via INTRAVENOUS
  Filled 2017-08-21: qty 48

## 2017-08-21 NOTE — Progress Notes (Signed)
[place] OFFICE PROGRESS NOTE  Patient Care Team: Jerrol Banana., MD as PCP - General (Family Medicine) Gillis Ends, MD as Referring Physician (Obstetrics and Gynecology) Bary Castilla Forest Gleason, MD as Consulting Physician (General Surgery)  Ovarian cancer [facility])   Staging form: Ovary, AJCC 7th Edition     Clinical: Stage IIIC (T3c, N0, M0) - Unsigned   Oncology History   # April- MAY 2016- OVARIAN CANCER STAGE IIIC; CA-125 +2300+;  s/p OPTIMAL DEBULKING SURGERY   # MAY 2016[name] [finished in Sep 19th 2017]  # MAY CT 2017- RECURRENT/Peritoneal carcinomatosis/pelvic implant ~1.2cm/Ca-125-34;   # July 3rd- CARBO-DOXIL q 4W [with neulasta]; CT May 11 2016- slight increase peritoneal / stable nodules. Cont chemo [finished DEC 2017]. DEC 28th 2017 CT- Increase in peritoneal deposits; increasing Ca 125 [60s];  # Jan 2018- carbo-Taxol-Avastin x6 cycles; Aug 2018- CR; Aug 2018- start Avastin Maintenance  # G-1-2 hand foot syndrome-   # LEFT BREAST CA s/p Lumpec & RT s/p TAM   # Afib [cardiology]; JUNE 2017- MUGA 62%  MOLECULAR TESTING- ATM genetic mutation; TUMOR BRCA- NEG; Myriad genomic instability- NEGATIVE     Ovary cancer, unspecified laterality ([place])    INTERVAL HISTORY:  77 year old female patient with above history of ovarian cancer recurrent Platinum Refractory ovarian cancer [dec 2017]. Currently on maintenance Avastin is here for follow-up.  At home patient's blood pressures have been in the range of 120-140s. She brings her log of her blood pressures.  She is currently on no blood pressure medications.  She continues to have left hip pain.  Patient has an appointment with physical therapy on January 14.  Not improving.  Denies any headaches.  Denies bloody nose.  No abdominal pain.  No constipation.  No swelling in the legs.  REVIEW OF SYSTEMS:  A complete 10 point review of system is done which is negative except mentioned  above/history of present illness.   PAST MEDICAL HISTORY :  Past Medical History:  Diagnosis Date  . Atrial fibrillation ([place])   . Breast cancer ([place]) 1998  . Cancer of breast ([place]) 1998   Left/radiation  . CINV (chemotherapy-induced nausea and vomiting)   . GERD (gastroesophageal reflux disease)   . Hyperlipidemia   . Hypothyroidism   . Mitral insufficiency   . Ovarian cancer ([place])    s/p BSO optimal tumor debulking May 2016/chemo  . Personal history of chemotherapy since 2016   ovarian cancer  . Personal history of radiation therapy 1998  . PVC (premature ventricular contraction)     PAST SURGICAL HISTORY :   Past Surgical History:  Procedure Laterality Date  . ABDOMINAL HYSTERECTOMY    . BILATERAL SALPINGOOPHORECTOMY  May 2016   with optimal tumor debulking   . BREAST LUMPECTOMY Left 1998  . BREAST SURGERY     Left  . CESAREAN SECTION     x2  . CHOLECYSTECTOMY    . COLONOSCOPY  2006  . DEBULKING N/A 12/22/2014   Procedure: DEBULKING;  Surgeon: Gillis Ends, MD;  Location: [facility] ORS;  Service: Gynecology;  Laterality: N/A;  . LAPAROTOMY N/A 12/22/2014   Procedure: EXPLORATORY LAPAROTOMY;  Surgeon: Robert Bellow, MD;  Location: [facility] ORS;  Service: General;  Laterality: N/A;  . LAPAROTOMY WITH STAGING N/A 12/22/2014   Procedure: LAPAROTOMY WITH STAGING;  Surgeon: Gillis Ends, MD;  Location: [facility] ORS;  Service: Gynecology;  Laterality: N/A;  . PERIPHERAL VASCULAR CATHETERIZATION Left 12/22/2014   Procedure:  PORTA CATH INSERTION;  Surgeon: Robert Bellow, MD;  Location: [facility] ORS;  Service: General;  Laterality: Left;  . PORTACATH PLACEMENT Right 12/22/2014   Procedure: INSERTION PORT-A-CATH;  Surgeon: Robert Bellow, MD;  Location: [facility] ORS;  Service: General;  Laterality: Right;  . SALPINGOOPHORECTOMY Bilateral 12/22/2014   Procedure: SALPINGO OOPHORECTOMY;  Surgeon: Gillis Ends, MD;  Location: [facility] ORS;  Service: Gynecology;  Laterality:  Bilateral;  . UPPER GI ENDOSCOPY  09/25/04   hiatus hernia, schatzki ring and a single gastric polyp    FAMILY HISTORY :   Family History  Problem Relation Age of Onset  . Cancer Mother        breast, throat, and stomach  . Breast cancer Mother   . Heart disease Father   . Pulmonary embolism Sister   . Cancer Brother 60       angiosarcoma of the chest; carcinoid small intestinal tumor  . Hypertension Brother   . Cancer Maternal Aunt        breast cancer  . Cancer Maternal Grandfather 57       pancreatic    SOCIAL HISTORY:   Social History   Tobacco Use  . Smoking status: Never Smoker  . Smokeless tobacco: Never Used  Substance Use Topics  . Alcohol use: No  . Drug use: No    ALLERGIES:  is allergic to carafate [sucralfate] and sulfa antibiotics.  MEDICATIONS:  Current Outpatient Medications  Medication Sig Dispense Refill  . acetaminophen (TYLENOL) 500 MG chewable tablet Chew 500 mg by mouth every 6 (six) hours as needed for pain.    . Cholecalciferol (VITAMIN D) 2000 units tablet Take 1 tablet (2,000 Units total) by mouth daily. 30 tablet 12  . Docosanol (ABREVA EX) Apply 1 application topically as needed. Fever blisters    . lactulose (CHRONULAC) 10 GM/15ML solution TAKE 15MLS (10G TOTAL) BY MOUTH THREE TIMES A DAY AS DIRECTED BY PHYSICIAN. 240 mL 1  . levothyroxine (SYNTHROID, LEVOTHROID) 50 MCG tablet TAKE 1 TABLET EVERY DAY ON AN EMPTY STOMACH WITH A GLASS OF WATER AT LEAST 30-60 MINUTES BEFORE BREAKFAST 30 tablet 12  . lidocaine-prilocaine (EMLA) cream Apply to port area 30-45 mins prior to chemo. 30 g 5  . lisinopril (PRINIVIL,ZESTRIL) 10 MG tablet Take 1 tablet (10 mg total) by mouth daily. 30 tablet 4  . lisinopril-hydrochlorothiazide (PRINZIDE,ZESTORETIC) 20-25 MG tablet Take 1 tablet by mouth daily. 30 tablet 0  . LORazepam (ATIVAN) 0.5 MG tablet Take 0.5 tablets (0.25 mg total) by mouth at bedtime. 30 tablet 4  . Lysine 500 MG TABS Take 1 tablet by mouth daily  as needed.     . Multiple Vitamin (MULTIVITAMIN) capsule Take 1 capsule by mouth daily.    . ondansetron (ZOFRAN) 4 MG tablet Take 1 tablet (4 mg total) by mouth every 8 (eight) hours as needed for nausea or vomiting. 30 tablet 3  . pantoprazole (PROTONIX) 40 MG tablet TAKE 1 TABLET BY MOUTH DAILY 30 tablet 11  . polyethylene glycol powder (GLYCOLAX/MIRALAX) powder TAKE 17GM (LINE INSIDE CAP) IN EIGHT OUNCES OF WATER OR OTHER LIQUID DAILY 255 g 2  . prochlorperazine (COMPAZINE) 10 MG tablet Take 1 tablet (10 mg total) by mouth every 6 (six) hours as needed for nausea or vomiting. 30 tablet 0  . propafenone (RYTHMOL) 225 MG tablet Take 225 mg by mouth 2 (two) times daily.    . traMADol (ULTRAM) 50 MG tablet Take 1 tablet (50 mg total) by mouth  every 6 (six) hours as needed. 30 tablet 0   No current facility-administered medications for this visit.    Facility-Administered Medications Ordered in Other Visits  Medication Dose Route Frequency Provider Last Rate Last Dose  . sodium chloride 0.9 % injection 10 mL  10 mL Intracatheter PRN Forest Gleason, MD   10 mL at 02/07/15 0930    PHYSICAL EXAMINATION: ECOG PERFORMANCE STATUS: 0 - Asymptomatic  BP (!) 146/86 (BP Location: Left Arm, Patient Position: Sitting)   Pulse 64   Temp 97.6 F (36.4 C)   Resp 16   Wt 192 lb (87.1 kg)   BMI 32.96 kg/m   Filed Weights   08/21/17 1139  Weight: 192 lb (87.1 kg)    GENERAL: Well-nourished well-developed; Alert, no distress and comfort. Accompanied by her husband. EYES: no pallor or icterus OROPHARYNX: no thrush or ulceration; good dentition  NECK: supple, no masses felt LYMPH:  no palpable lymphadenopathy in the cervical, axillary or inguinal regions.  LUNGS: clear to auscultation and  No wheeze or crackles HEART/CVS: regular rate & rhythm and no murmurs; No lower extremity edema  ABDOMEN:abdomen soft, non-tender and normal bowel sounds Musculoskeletal:no cyanosis of digits and no clubbing. No  redness, temperature warm and well perfused.  PSYCH: alert & oriented x 3 with fluent speech NEURO: no focal motor/sensory deficits SKIN:  Normal. No rash noted.   LABORATORY DATA:  I have reviewed the data as listed    Component Value Date/Time   NA 135 08/21/2017 1058   NA 140 12/14/2014 1037   K 4.5 08/21/2017 1058   K 4.4 12/14/2014 1037   CL 101 08/21/2017 1058   CL 106 12/14/2014 1037   CO2 26 08/21/2017 1058   CO2 26 12/14/2014 1037   GLUCOSE 80 08/21/2017 1058   GLUCOSE 87 12/14/2014 1037   BUN 16 08/21/2017 1058   BUN 12 12/14/2014 1037   CREATININE 1.09 (H) 08/21/2017 1058   CREATININE 0.97 12/14/2014 1037   CALCIUM 9.5 08/21/2017 1058   CALCIUM 9.2 12/14/2014 1037   PROT 6.8 08/21/2017 1058   PROT 7.3 12/14/2014 1037   ALBUMIN 3.5 08/21/2017 1058   ALBUMIN 3.8 12/14/2014 1037   AST 23 08/21/2017 1058   AST 20 12/14/2014 1037   ALT 15 08/21/2017 1058   ALT 11 (L) 12/14/2014 1037   ALKPHOS 65 08/21/2017 1058   ALKPHOS 138 (H) 12/14/2014 1037   BILITOT 0.6 08/21/2017 1058   BILITOT 0.5 12/14/2014 1037   GFRNONAA 49 (L) 08/21/2017 1058   GFRNONAA 59 (L) 12/14/2014 1037   GFRAA 57 (L) 08/21/2017 1058   GFRAA >60 12/14/2014 1037    No results found for: SPEP, UPEP  Lab Results  Component Value Date   WBC 6.5 08/21/2017   NEUTROABS 2.1 08/21/2017   HGB 14.0 08/21/2017   HCT 41.5 08/21/2017   MCV 94.3 08/21/2017   PLT 257 08/21/2017      Chemistry      Component Value Date/Time   NA 135 08/21/2017 1058   NA 140 12/14/2014 1037   K 4.5 08/21/2017 1058   K 4.4 12/14/2014 1037   CL 101 08/21/2017 1058   CL 106 12/14/2014 1037   CO2 26 08/21/2017 1058   CO2 26 12/14/2014 1037   BUN 16 08/21/2017 1058   BUN 12 12/14/2014 1037   CREATININE 1.09 (H) 08/21/2017 1058   CREATININE 0.97 12/14/2014 1037   GLU 84 03/16/2014      Component Value Date/Time  CALCIUM 9.5 08/21/2017 1058   CALCIUM 9.2 12/14/2014 1037   ALKPHOS 65 08/21/2017 1058   ALKPHOS  138 (H) 12/14/2014 1037   AST 23 08/21/2017 1058   AST 20 12/14/2014 1037   ALT 15 08/21/2017 1058   ALT 11 (L) 12/14/2014 1037   BILITOT 0.6 08/21/2017 1058   BILITOT 0.5 12/14/2014 1037     IMPRESSION: 1. Status post hysterectomy, without recurrent or metastatic disease. 2.  Possible constipation. 3.  Tiny hiatal hernia. 4. Hepatic hemangiomas.  5. Cystic lesion within the right-side of the vagina is similar over prior exams and may represent a Bartholin's gland cyst.   Electronically Signed   By: Abigail Miyamoto M.D.   On: 03/28/2017 12:09 Results for VIVIAN, NEUWIRTH (MRN [id number]) as of 04/24/2017 09:53  Ref. Range 12/08/2014 14:59 03/14/2015 15:13 06/08/2015 11:54 07/20/2015 11:43 09/13/2015 10:30 12/14/2015 10:02 01/04/2016 13:19 02/20/2016 09:58 03/21/2016 08:57 05/16/2016 11:14 06/13/2016 09:51 06/27/2016 09:50 08/15/2016 08:55 09/24/2016 09:03 10/22/2016 08:26 11/05/2016 08:28 11/26/2016 08:28 01/21/2017 09:13 02/25/2017 08:19 04/01/2017 08:40  CA 125 Latest Ref Range: 0.0 - 38.1 U/mL 2,354.0 (H) 9.8 14.9 14.5 17.8 24.4 34.1 47.5 (H) 31.1 37.5 44.8 (H) 50.4 (H) 64.5 (H) 101.7 (H) 18.2 13.0 12.6 12.8    Cancer Antigen (CA) 125 Latest Ref Range: 0.0 - 38.1 U/mL                   13.3 14.9    RADIOGRAPHIC STUDIES: I have personally reviewed the radiological images as listed and agreed with the findings in the report. No results found.   ASSESSMENT & PLAN:  Ovary cancer, unspecified laterality ([place]) Recurrent  Platinum refractory ovarian cancer [dec 2017] August 10 CT scan- improvement/resolution of the pelvic peritoneal nodularity. Oct 24th 2018- Gynecological exam with Dr. Theora Gianotti- resolution of the palpable metastatic disease.  November 2018 pelvic MRI [done for hip pain]-negative for metastatic disease.  CA-125 ~14/stable.  Awaiting one from today.  Discussed that if significantly worse would recommend repeat imaging now.  Otherwise we will plan to get again in February/March.  # Currently on  Avastin maintenance.  No concerns for clinical progression;   # Continue maintenance avastin- tolerating well; except slightly elevated HTN [see discussion below]  # left hip pain- not resolved; MRI-lumbar/hip pain-Neg for mets; ? arthritis- awaiting PT on jan 14th.   # Elevated Blood pressure- 140s; but  Blood pressures at home-  Systolic~ [id number] ~69-62. continue lisinpril 10 mg/day.   #Tooth pain-no evidence of any abscess.  Recommend avoiding dental extraction while on Avastin.  Okay to be evaluated by dentistry.  # follow up in in 3 weeks/avastin UA/labs- ca-125.    Orders Placed This Encounter  Procedures  . CBC with Differential/Platelet    Standing Status:   Future    Standing Expiration Date:   08/21/2018  . Comprehensive metabolic panel    Standing Status:   Future    Standing Expiration Date:   08/21/2018  . CA 125    Standing Status:   Future    Standing Expiration Date:   08/21/2018  . Urinalysis, Complete w Microscopic    Standing Status:   Future    Standing Expiration Date:   02/18/2018     Cammie Sickle, MD 08/21/2017 2:26 PM

## 2017-08-21 NOTE — Assessment & Plan Note (Addendum)
Recurrent  Platinum refractory ovarian cancer [dec 2017] August 10 CT scan- improvement/resolution of the pelvic peritoneal nodularity. Oct 24th 2018- Gynecological exam with Dr. Theora Gianotti- resolution of the palpable metastatic disease.  November 2018 pelvic MRI [done for hip pain]-negative for metastatic disease.  CA-125 ~14/stable.  Awaiting one from today.  Discussed that if significantly worse would recommend repeat imaging now.  Otherwise we will plan to get again in February/March.  # Currently on Avastin maintenance.  No concerns for clinical progression;   # Continue maintenance avastin- tolerating well; except slightly elevated HTN [see discussion below]  # left hip pain- not resolved; MRI-lumbar/hip pain-Neg for mets; ? arthritis- awaiting PT on jan 14th.   # Elevated Blood pressure- 140s; but  Blood pressures at home-  Systolic~ [id number] ~83-25. continue lisinpril 10 mg/day.   #Tooth pain-no evidence of any abscess.  Recommend avoiding dental extraction while on Avastin.  Okay to be evaluated by dentistry.  # follow up in in 3 weeks/avastin UA/labs- ca-125.

## 2017-08-22 LAB — CA 125: Cancer Antigen (CA) 125: 19.6 U/mL (ref 0.0–38.1)

## 2017-08-30 DIAGNOSIS — H16223 Keratoconjunctivitis sicca, not specified as Sjogren's, bilateral: Secondary | ICD-10-CM | POA: Diagnosis not present

## 2017-09-02 ENCOUNTER — Ambulatory Visit: Payer: PPO | Attending: Internal Medicine | Admitting: Physical Therapy

## 2017-09-02 DIAGNOSIS — M25552 Pain in left hip: Secondary | ICD-10-CM | POA: Diagnosis not present

## 2017-09-02 DIAGNOSIS — M6281 Muscle weakness (generalized): Secondary | ICD-10-CM | POA: Insufficient documentation

## 2017-09-02 DIAGNOSIS — R2689 Other abnormalities of gait and mobility: Secondary | ICD-10-CM | POA: Insufficient documentation

## 2017-09-02 NOTE — Therapy (Addendum)
[facility] [address] [place], [address] Phone: [phone]   Fax:  [phone]  Physical Therapy Evaluation  Patient Details  Name: Jamie Conner MRN: [id number] Date of Birth: [birth year] Referring Provider: Eliezer Bottom   Encounter Date: 09/02/2017  PT End of Session - 09/02/17 1551    Visit Number  1    Number of Visits  12    Date for PT Re-Evaluation  09/23/17    Authorization Type  [employer]     Authorization Time Period  09/02/17-10/14/17    Authorization - Visit Number  1    Authorization - Number of Visits  10    PT Start Time  1062    PT Stop Time  1538    PT Time Calculation (min)  62 min    Activity Tolerance  Patient tolerated treatment well    Behavior During Therapy  [facility] for tasks assessed/performed       Past Medical History:  Diagnosis Date  . Atrial fibrillation ([place])   . Breast cancer ([place]) 1998  . Cancer of breast ([place]) 1998   Left/radiation  . CINV (chemotherapy-induced nausea and vomiting)   . GERD (gastroesophageal reflux disease)   . Hyperlipidemia   . Hypothyroidism   . Mitral insufficiency   . Ovarian cancer ([place])    s/p BSO optimal tumor debulking May 2016/chemo  . Personal history of chemotherapy since 2016   ovarian cancer  . Personal history of radiation therapy 1998  . PVC (premature ventricular contraction)     Past Surgical History:  Procedure Laterality Date  . ABDOMINAL HYSTERECTOMY    . BILATERAL SALPINGOOPHORECTOMY  May 2016   with optimal tumor debulking   . BREAST LUMPECTOMY Left 1998  . BREAST SURGERY     Left  . CESAREAN SECTION     x2  . CHOLECYSTECTOMY    . COLONOSCOPY  2006  . DEBULKING N/A 12/22/2014   Procedure: DEBULKING;  Surgeon: Gillis Ends, MD;  Location: [facility];  Service: Gynecology;  Laterality: N/A;  . LAPAROTOMY N/A 12/22/2014   Procedure: EXPLORATORY LAPAROTOMY;  Surgeon: Robert Bellow, MD;   Location: [facility];  Service: General;  Laterality: N/A;  . LAPAROTOMY WITH STAGING N/A 12/22/2014   Procedure: LAPAROTOMY WITH STAGING;  Surgeon: Gillis Ends, MD;  Location: [facility];  Service: Gynecology;  Laterality: N/A;  . PERIPHERAL VASCULAR CATHETERIZATION Left 12/22/2014   Procedure: PORTA CATH INSERTION;  Surgeon: Robert Bellow, MD;  Location: [facility];  Service: General;  Laterality: Left;  . PORTACATH PLACEMENT Right 12/22/2014   Procedure: INSERTION PORT-A-CATH;  Surgeon: Robert Bellow, MD;  Location: [facility];  Service: General;  Laterality: Right;  . SALPINGOOPHORECTOMY Bilateral 12/22/2014   Procedure: SALPINGO OOPHORECTOMY;  Surgeon: Gillis Ends, MD;  Location: [facility];  Service: Gynecology;  Laterality: Bilateral;  . UPPER GI ENDOSCOPY  09/25/04   hiatus hernia, schatzki ring and a single gastric polyp    There were no vitals filed for this visit.   Subjective Assessment - 09/02/17 1451    Subjective  Pt reports she has been having stiffness in Left anterior groin.    Pertinent History  Pt reports she has been having increased stiffness and achy pain in the left anterior hip since September 2018. She reports there used to be intermittent pain in the morning, but now most morning feel pretty good. She reports increased symptoms with increased activity on  feet. She self-limited trips into the community to 1 at a time and keeps them short. No prior history of Left hip injury or surgery, and no prior incidence of these symptoms. Pt reports old Rt knee issue.  Pt does reports some subjective dynamic instability. Pt denies falls history. She says her balance is 'pretty good' but she has to 'watch what she's doing and where she's stepping'.     Limitations  Walking    How long can you sit comfortably?  Increaed stiffness after sitting 1 hour     How long can you stand comfortably?  No limited activity    How long can you walk comfortably?  15 minutes (self  selected gait for IADL)     Diagnostic tests  Xray/MRI     Patient Stated Goals  Make symptoms go away     Pain Score  2     Pain Location  -- Left anterior hip/groin         [facility] PT Assessment - 09/02/17 0001      Assessment   Medical Diagnosis  Left groin pain/stiffness    Referring Provider  Eliezer Bottom    Onset Date/Surgical Date  -- Septembr 2018     Hand Dominance  Right    Next MD Visit  09/11/16 for CA treament, not for hip specifically    Prior Therapy  denies injctions, med; can only take acetomenophen      Precautions   Precautions  Other (comment) Cannot take NSAIDs on current CA meds      Restrictions   Weight Bearing Restrictions  No      Balance Screen   Has the patient fallen in the past 6 months  No    Has the patient had a decrease in activity level because of a fear of falling?   Yes    Is the patient reluctant to leave their home because of a fear of falling?   Yes      [place]  Private residence    Living Arrangements  Spouse/significant other    Additional Comments  4 stairs to enter, 1 railing      Prior Function   Level of Independence  Independent will use shopping buggy for energy conservation    Vocation  Retired    [employer]  retired Print production planner    Leisure  light gardening, piano      Observation/Other Assessments   Observations  left sided compensated trendelenburg    Other Surveys   Other Surveys    Lower Extremity Functional Scale   48/80      Sensation   Light Touch  Impaired Detail    Light Touch Impaired Details  Impaired RLE;Impaired LLE reports moderate loss of bilat pedal sensation d/t CA drugs      Functional Tests   Functional tests  Single leg stance;Sit to Stand      Single Leg Stance   Comments  14.68sec Right 8sec left, altered trunk posture with ~20*F, 20* trunk IR,      Sit to Stand   Comments  5xSTS: 39.62sec, hands free heavy weight shift to Right side        ROM / Strength   AROM / PROM / Strength  PROM;Strength      PROM   PROM Assessment Site  Hip    Right/Left Hip  Right;Left    Right Hip External Rotation   45    Right  Hip Internal Rotation   30    Left Hip Flexion  135    Left Hip External Rotation   48    Left Hip Internal Rotation   46    Left Hip ABduction  27 most significant pain      Strength   Strength Assessment Site  Hip;Knee    Right/Left Hip  Right;Left    Right Hip Flexion  4+/5    Right Hip External Rotation   4+/5    Right Hip Internal Rotation  5/5    Right Hip ABduction  3+/5 horizontal abdct: 4+/5    Right Hip ADduction  4+/5    Left Hip Flexion  4+/5    Left Hip External Rotation  5/5    Left Hip Internal Rotation  4/5 giving way, joint pain    Left Hip ABduction  3/5 horizontal abdct: 4+/5    Left Hip ADduction  4+/5    Right/Left Knee  Right;Left    Right Knee Flexion  4+/5    Right Knee Extension  5/5    Left Knee Flexion  4+/5    Left Knee Extension  5/5      Right Hip   Right Hip Flexion  138    Right Hip ABduction  30      Palpation   Palpation comment  significant pain c palpation of Right groin/Lat GMed      Special Tests   Other special tests  Left hip FADIR negative; Scour negative      Ambulation/Gait   Gait velocity  0.51m/s    Gait Comments  10MWT             Objective measurements completed on examination: See above findings.              PT Education - 09/02/17 1550    Education provided  Yes    Education Details  role of chronic overuse of weak hip muscles     Person(s) Educated  Patient    Methods  Explanation;Demonstration    Comprehension  Need further instruction          PT Long Term Goals - 09/02/17 1605      PT LONG TERM GOAL #1   Title  After 6 weeks patient will demonstrate 5/5 strength in hip flexion bilat and ER/IR of hips seated bilat, without exacerbation of pain, 4/5 or greater bilat glute med abduction supine.     Time  6    Period   Weeks    Status  New    Target Date  10/14/17      PT LONG TERM GOAL #2   Title  After 6 weeks patient wil demonstrate improved self selected gait speed as evidenced 10MWT >1.50m/s.    Time  6    Period  Weeks    Status  New    Target Date  10/14/17      PT LONG TERM GOAL #3   Title  After 6 weeks patient will demonstrate improve funcitonal hiop strength AEB 5xSTS hands free in <18seconds.     Time  6    Period  Weeks    Status  New    Target Date  10/14/17      PT LONG TERM GOAL #4   Title  After 6 weeks patient will demonstrate corrct form and report good compliance wiht advanced HEP for DC to continue progress in hip strengthing fro next 3-6 months.  Time  6    Period  Weeks    Status  New    Target Date  10/14/17             Plan - 09/02/17 1553    Clinical Impression Statement  Pt presents 10M s/p insidious onset of Left sided groin pain and stiffness, associated with prolonged sitting and worse with activity. Gait revealing of bilat compensated trendelenburg L>R, with Left anltalgic unloading, and slowed gait speed below norm for age/gender.  ROM WNL,, negative scour test, and negative FADIR test suggestive fo low liklihood of isolated arthropathy as primary source of pain. Noted pattern of weakness in Left hip flexors, with strong compensation by short adductor group, weakness in hip extensors AEB 5xSTS>30sec, and very weak glute med AEB MMT of abduction and seated internal rotation, all showing generalized weakness of global hip muscles. Stretch and palatiion of glute med and high adductors are most successful at reproducing pain, particularly with assessment of Lt hip abdct ROM. Tenderness at the Left greater trochanter suggests some questionable involement of gluteal tendon/bursa as well, which is consistent with this movement dysfunction. Extensive education given to patient about these findings, normal integrity of hip joint, and low liklihood of involvement of MRI  related disc budlging in this problem.     History and Personal Factors relevant to plan of care:  Under CA treatment and unable to take NSAIDs, could contact referring MD about use of iontophoresis as needed for trochanteric symptoms.     Clinical Presentation  Evolving    Clinical Presentation due to:  objective tests and measures.     Clinical Decision Making  Moderate    Rehab Potential  Fair    Clinical Impairments Affecting Rehab Potential  chronicicty of deficits in strength     PT Frequency  2x / week    PT Duration  6 weeks    PT Treatment/Interventions  ADLs/Self Care Home Management;Balance training;Gait training;Stair training;Cryotherapy;Iontophoresis 4mg /ml Dexamethasone;Moist Heat;Therapeutic exercise;Therapeutic activities;Functional mobility training    PT Next Visit Plan  Review issued HEP exercises, review goals, continue with isolated glute med strengthening program, hip flexion strengthening over 90 degrees to limit adductor contribution, hip extension strengthening, soft tissue work to [employer] groin spasms as tolerate.d     PT Home Exercise Plan  at Eval: Supine bridging, Supine rog Adductor stretch, seated alternat marching hip flexion.     Consulted and Agree with Plan of Care  Patient       Patient will benefit from skilled therapeutic intervention in order to improve the following deficits and impairments:  Abnormal gait, Obesity, Decreased activity tolerance, Pain, Increased fascial restricitons, Decreased balance, Difficulty walking, Increased muscle spasms, Improper body mechanics  Visit Diagnosis: Pain in left hip  Muscle weakness (generalized)  Other abnormalities of gait and mobility     Problem List Patient Active Problem List   Diagnosis Date Noted  . Counseling regarding goals of care 11/26/2016  . Encounter for monitoring cardiotoxic drug therapy 02/02/2016  . Breast cancer in female [facility]) 12/13/2015  . Peptic ulcer disease 12/12/2015  . DDD  (degenerative disc disease), lumbosacral 12/12/2015  . Hyperlipidemia 12/12/2015  . Acute anxiety 12/12/2015  . Insomnia 12/12/2015  . Allergic rhinitis 12/12/2015  . GERD (gastroesophageal reflux disease) 12/12/2015  . Internal hemorrhoid 12/12/2015  . OA (osteoarthritis) 12/12/2015  . Osteopenia 12/12/2015  . Hypothyroid 04/11/2015  . Benign essential HTN 02/01/2015  . Retroperitoneal fibrosis   . Combined fat and carbohydrate induced hyperlipemia  01/04/2015  . Awareness of heartbeats 01/04/2015  . Beat, premature ventricular 01/04/2015  . Ovary cancer, unspecified laterality ([place]) 12/16/2014  . MI (mitral incompetence) 09/02/2014  . AF (paroxysmal atrial fibrillation) ([place]) 09/02/2014   4:13 PM, 09/02/17 Etta Grandchild, PT, DPT [facility] - [place] [phone] (Office)   Buccola,Allan C 09/02/2017, 4:12 PM  [place] [address], [place], [address] Phone: [phone]   Fax:  [phone]  Name: Jamie Conner MRN: [id number] Date of Birth: 09-Mar-1941

## 2017-09-04 ENCOUNTER — Ambulatory Visit: Payer: PPO | Admitting: Physical Therapy

## 2017-09-06 ENCOUNTER — Ambulatory Visit: Payer: PPO

## 2017-09-06 DIAGNOSIS — M25552 Pain in left hip: Secondary | ICD-10-CM

## 2017-09-06 DIAGNOSIS — M6281 Muscle weakness (generalized): Secondary | ICD-10-CM

## 2017-09-06 NOTE — Therapy (Signed)
[place] [facility] [address], [place], [address] Phone: [phone]   Fax:  [phone]  Physical Therapy Treatment  Patient Details  Name: Jamie Conner MRN: [id number] Date of Birth: [birth year] Referring Provider: Eliezer Bottom   Encounter Date: 09/06/2017  PT End of Session - 09/06/17 0921    Visit Number  2    Number of Visits  12    Date for PT Re-Evaluation  09/23/17    Authorization Type  [employer]     Authorization Time Period  09/02/17-10/14/17    PT Start Time  0916    PT Stop Time  1000    PT Time Calculation (min)  44 min    Activity Tolerance  Patient tolerated treatment well    Behavior During Therapy  [facility] for tasks assessed/performed       Past Medical History:  Diagnosis Date  . Atrial fibrillation ([place])   . Breast cancer ([place]) 1998  . Cancer of breast ([place]) 1998   Left/radiation  . CINV (chemotherapy-induced nausea and vomiting)   . GERD (gastroesophageal reflux disease)   . Hyperlipidemia   . Hypothyroidism   . Mitral insufficiency   . Ovarian cancer ([place])    s/p BSO optimal tumor debulking May 2016/chemo  . Personal history of chemotherapy since 2016   ovarian cancer  . Personal history of radiation therapy 1998  . PVC (premature ventricular contraction)     Past Surgical History:  Procedure Laterality Date  . ABDOMINAL HYSTERECTOMY    . BILATERAL SALPINGOOPHORECTOMY  May 2016   with optimal tumor debulking   . BREAST LUMPECTOMY Left 1998  . BREAST SURGERY     Left  . CESAREAN SECTION     x2  . CHOLECYSTECTOMY    . COLONOSCOPY  2006  . DEBULKING N/A 12/22/2014   Procedure: DEBULKING;  Surgeon: Gillis Ends, MD;  Location: [facility];  Service: Gynecology;  Laterality: N/A;  . LAPAROTOMY N/A 12/22/2014   Procedure: EXPLORATORY LAPAROTOMY;  Surgeon: Robert Bellow, MD;  Location: [facility];  Service: General;  Laterality: N/A;  . LAPAROTOMY WITH  STAGING N/A 12/22/2014   Procedure: LAPAROTOMY WITH STAGING;  Surgeon: Gillis Ends, MD;  Location: [facility];  Service: Gynecology;  Laterality: N/A;  . PERIPHERAL VASCULAR CATHETERIZATION Left 12/22/2014   Procedure: PORTA CATH INSERTION;  Surgeon: Robert Bellow, MD;  Location: [facility];  Service: General;  Laterality: Left;  . PORTACATH PLACEMENT Right 12/22/2014   Procedure: INSERTION PORT-A-CATH;  Surgeon: Robert Bellow, MD;  Location: [facility];  Service: General;  Laterality: Right;  . SALPINGOOPHORECTOMY Bilateral 12/22/2014   Procedure: SALPINGO OOPHORECTOMY;  Surgeon: Gillis Ends, MD;  Location: [facility];  Service: Gynecology;  Laterality: Bilateral;  . UPPER GI ENDOSCOPY  09/25/04   hiatus hernia, schatzki ring and a single gastric polyp    There were no vitals filed for this visit.  Subjective Assessment - 09/06/17 0918    Subjective  Pt reports that she is doing well today. She has noticed decreased pain in her L hip since the initial evaluation and believes that her gait is less painful. No specific questions or concerns at this time.     Pertinent History  Pt reports she has been having increased stiffness and achy pain in the left anterior hip since September 2018. She reports there used to be intermittent pain in the morning, but now most morning feel pretty good. She  reports increased symptoms with increased activity on feet. She self-limited trips into the community to 1 at a time and keeps them short. No prior history of Left hip injury or surgery, and no prior incidence of these symptoms. Pt reports old Rt knee issue.  Pt does reports some subjective dynamic instability. Pt denies falls history. She says her balance is 'pretty good' but she has to 'watch what she's doing and where she's stepping'.     Limitations  Walking    How long can you sit comfortably?  Increaed stiffness after sitting 1 hour     How long can you stand comfortably?  No limited activity     How long can you walk comfortably?  15 minutes (self selected gait for IADL)     Diagnostic tests  Xray/MRI     Patient Stated Goals  Make symptoms go away     Currently in Pain?  No/denies         TREATMENT   Ther-ex  NuStep L1 x 5 minutes for warm-up during history (3 minutes unbilled); HEP review; Hooklying adductor "frog stretch" 30s hold x 3 (HEP); L figure 4 stretch with assist provided by therapist 30s hold x 3; Hooklying bridges 2 x 10, during second set pt reports some mid back pain. Pt instructed how to posterior tilt pelvis before bridge which relieves her pain and pt feels more localized muscle fatigue in her glutes (HEP); Attempted R sidelying hip straight leg abduction but pt is too weak to complete and reports increase in pain so discontinued; Hooklying clams with manual resistance 2 x 10, no pain; Seated alternating marching hip flexion x 16, cues to avoid posterior hip lean (HEP); Pt issued seated clams with yellow tband to HEP, handout provided.                      PT Education - 09/06/17 [phone]    Education provided  Yes    Education Details  HEP reinforced and progressed    Person(s) Educated  Patient    Methods  Explanation    Comprehension  Verbalized understanding          PT Long Term Goals - 09/02/17 1605      PT LONG TERM GOAL #1   Title  After 6 weeks patient will demonstrate 5/5 strength in hip flexion bilat and ER/IR of hips seated bilat, without exacerbation of pain, 4/5 or greater bilat glute med abduction supine.     Time  6    Period  Weeks    Status  New    Target Date  10/14/17      PT LONG TERM GOAL #2   Title  After 6 weeks patient wil demonstrate improved self selected gait speed as evidenced 10MWT >1.75m/s.    Time  6    Period  Weeks    Status  New    Target Date  10/14/17      PT LONG TERM GOAL #3   Title  After 6 weeks patient will demonstrate improve funcitonal hiop strength AEB 5xSTS hands free in  <18seconds.     Time  6    Period  Weeks    Status  New    Target Date  10/14/17      PT LONG TERM GOAL #4   Title  After 6 weeks patient will demonstrate corrct form and report good compliance wiht advanced HEP for DC to continue progress in hip strengthing  fro next 3-6 months.     Time  6    Period  Weeks    Status  New    Target Date  10/14/17            Plan - 09/06/17 5170    Clinical Impression Statement  Pt reports some increased groin soreness at end of session today. She demonstrates poor hip abduction and extension strength during exercises. Pt is also reasonably easily fatigued during session today. HEP reinforced and added seated clams with yellow tband. Pt encouraged to follow-up as scheduled.     Rehab Potential  Fair    Clinical Impairments Affecting Rehab Potential  chronicicty of deficits in strength     PT Frequency  2x / week    PT Duration  6 weeks    PT Treatment/Interventions  ADLs/Self Care Home Management;Balance training;Gait training;Stair training;Cryotherapy;Iontophoresis 4mg /ml Dexamethasone;Moist Heat;Therapeutic exercise;Therapeutic activities;Functional mobility training    PT Next Visit Plan  Review issued HEP exercises, review goals, continue with isolated glute med strengthening program, hip flexion strengthening over 90 degrees to limit adductor contribution, hip extension strengthening, soft tissue work to [employer] groin spasms as tolerated    PT Home Exercise Plan  Supine bridging, Supine rog Adductor stretch, seated alternating marching hip flexion, seated clams with yellow tband    Consulted and Agree with Plan of Care  Patient       Patient will benefit from skilled therapeutic intervention in order to improve the following deficits and impairments:  Abnormal gait, Obesity, Decreased activity tolerance, Pain, Increased fascial restricitons, Decreased balance, Difficulty walking, Increased muscle spasms, Improper body mechanics  Visit  Diagnosis: Pain in left hip  Muscle weakness (generalized)     Problem List Patient Active Problem List   Diagnosis Date Noted  . Counseling regarding goals of care 11/26/2016  . Encounter for monitoring cardiotoxic drug therapy 02/02/2016  . Breast cancer in female [facility]) 12/13/2015  . Peptic ulcer disease 12/12/2015  . DDD (degenerative disc disease), lumbosacral 12/12/2015  . Hyperlipidemia 12/12/2015  . Acute anxiety 12/12/2015  . Insomnia 12/12/2015  . Allergic rhinitis 12/12/2015  . GERD (gastroesophageal reflux disease) 12/12/2015  . Internal hemorrhoid 12/12/2015  . OA (osteoarthritis) 12/12/2015  . Osteopenia 12/12/2015  . Hypothyroid 04/11/2015  . Benign essential HTN 02/01/2015  . Retroperitoneal fibrosis   . Combined fat and carbohydrate induced hyperlipemia 01/04/2015  . Awareness of heartbeats 01/04/2015  . Beat, premature ventricular 01/04/2015  . Ovary cancer, unspecified laterality ([place]) 12/16/2014  . MI (mitral incompetence) 09/02/2014  . AF (paroxysmal atrial fibrillation) ([place]) 09/02/2014   Phillips Grout PT, DPT   Salene Mohamud 09/06/2017, 10:32 AM  [place] [facility] [address] [place], [address] Phone: [phone]   Fax:  [phone]  Name: Jamie Conner MRN: [id number] Date of Birth: Sep 08, 1940

## 2017-09-09 ENCOUNTER — Ambulatory Visit: Payer: PPO | Admitting: *Deleted

## 2017-09-09 ENCOUNTER — Encounter: Payer: Self-pay | Admitting: Physical Therapy

## 2017-09-09 ENCOUNTER — Other Ambulatory Visit: Payer: Self-pay | Admitting: *Deleted

## 2017-09-09 ENCOUNTER — Ambulatory Visit: Payer: PPO | Admitting: Physical Therapy

## 2017-09-09 DIAGNOSIS — M6281 Muscle weakness (generalized): Secondary | ICD-10-CM

## 2017-09-09 DIAGNOSIS — M25552 Pain in left hip: Secondary | ICD-10-CM

## 2017-09-09 NOTE — Therapy (Signed)
[facility] [address] [place], [address] Phone: [phone]   Fax:  [phone]  Physical Therapy Treatment  Patient Details  Name: Jamie Conner MRN: [id number] Date of Birth: 09/23/1940 Referring Provider: Eliezer Bottom   Encounter Date: 09/09/2017  PT End of Session - 09/09/17 1358    Visit Number  3    Number of Visits  12    Date for PT Re-Evaluation  09/23/17    Authorization Type  [employer]     Authorization Time Period  09/02/17-10/14/17    Authorization - Visit Number  3    Authorization - Number of Visits  10    PT Start Time  0105    PT Stop Time  0150    PT Time Calculation (min)  45 min    Activity Tolerance  Patient tolerated treatment well    Behavior During Therapy  [facility] for tasks assessed/performed       Past Medical History:  Diagnosis Date  . Atrial fibrillation ([place])   . Breast cancer ([place]) 1998  . Cancer of breast ([place]) 1998   Left/radiation  . CINV (chemotherapy-induced nausea and vomiting)   . GERD (gastroesophageal reflux disease)   . Hyperlipidemia   . Hypothyroidism   . Mitral insufficiency   . Ovarian cancer ([place])    s/p BSO optimal tumor debulking May 2016/chemo  . Personal history of chemotherapy since 2016   ovarian cancer  . Personal history of radiation therapy 1998  . PVC (premature ventricular contraction)     Past Surgical History:  Procedure Laterality Date  . ABDOMINAL HYSTERECTOMY    . BILATERAL SALPINGOOPHORECTOMY  May 2016   with optimal tumor debulking   . BREAST LUMPECTOMY Left 1998  . BREAST SURGERY     Left  . CESAREAN SECTION     x2  . CHOLECYSTECTOMY    . COLONOSCOPY  2006  . DEBULKING N/A 12/22/2014   Procedure: DEBULKING;  Surgeon: Gillis Ends, MD;  Location: [facility];  Service: Gynecology;  Laterality: N/A;  . LAPAROTOMY N/A 12/22/2014   Procedure: EXPLORATORY LAPAROTOMY;  Surgeon: Robert Bellow, MD;   Location: [facility];  Service: General;  Laterality: N/A;  . LAPAROTOMY WITH STAGING N/A 12/22/2014   Procedure: LAPAROTOMY WITH STAGING;  Surgeon: Gillis Ends, MD;  Location: [facility];  Service: Gynecology;  Laterality: N/A;  . PERIPHERAL VASCULAR CATHETERIZATION Left 12/22/2014   Procedure: PORTA CATH INSERTION;  Surgeon: Robert Bellow, MD;  Location: [facility];  Service: General;  Laterality: Left;  . PORTACATH PLACEMENT Right 12/22/2014   Procedure: INSERTION PORT-A-CATH;  Surgeon: Robert Bellow, MD;  Location: [facility];  Service: General;  Laterality: Right;  . SALPINGOOPHORECTOMY Bilateral 12/22/2014   Procedure: SALPINGO OOPHORECTOMY;  Surgeon: Gillis Ends, MD;  Location: [facility];  Service: Gynecology;  Laterality: Bilateral;  . UPPER GI ENDOSCOPY  09/25/04   hiatus hernia, schatzki ring and a single gastric polyp    There were no vitals filed for this visit.  Subjective Assessment - 09/09/17 1312    Subjective  Pt reports that her pain is getting much better in the L hip and is only a 2/10 pain today. She said that she is noting that she has some weakness in the L LE and has to step with a step-to gait pattern with stair ambulation.     Pertinent History  Pt reports she has been having increased stiffness and  achy pain in the left anterior hip since September 2018. She reports there used to be intermittent pain in the morning, but now most morning feel pretty good. She reports increased symptoms with increased activity on feet. She self-limited trips into the community to 1 at a time and keeps them short. No prior history of Left hip injury or surgery, and no prior incidence of these symptoms. Pt reports old Rt knee issue.  Pt does reports some subjective dynamic instability. Pt denies falls history. She says her balance is 'pretty good' but she has to 'watch what she's doing and where she's stepping'.     Limitations  Walking;Other (comment)    How long can you sit  comfortably?  Increaed stiffness after sitting 1 hour (reports this is getting better)    How long can you stand comfortably?  No limited activity    How long can you walk comfortably?  15 minutes (self selected gait for IADL)     Diagnostic tests  Xray/MRI     Patient Stated Goals  Make symptoms go away     Currently in Pain?  Yes    Pain Score  2     Pain Location  Hip    Pain Orientation  Medial    Pain Descriptors / Indicators  Aching    Pain Type  Chronic pain    Pain Onset  1 to 4 weeks ago    Pain Frequency  Intermittent           NuStep warm-up during hx intake (70mins) Thomas stretch 3x 45sec hold performed by PT Figure 4 stretch 3x 45sec hold w/ PT overpressure Standing hip flexor stretch 3x 45sec hold Clamshell on L 3x 10 with manual resistance on concentric and eccentric Sidelying abd 3x 5/6/7 Sidestepping 53ft (down and back) x3                  PT Education - 09/09/17 1356    Education Details  HEP progression, education on anatomy and physiology involved    Person(s) Educated  Patient    Methods  Explanation;Demonstration    Comprehension  Verbalized understanding;Returned demonstration          PT Long Term Goals - 09/02/17 1605      PT LONG TERM GOAL #1   Title  After 6 weeks patient will demonstrate 5/5 strength in hip flexion bilat and ER/IR of hips seated bilat, without exacerbation of pain, 4/5 or greater bilat glute med abduction supine.     Time  6    Period  Weeks    Status  New    Target Date  10/14/17      PT LONG TERM GOAL #2   Title  After 6 weeks patient wil demonstrate improved self selected gait speed as evidenced 10MWT >1.21m/s.    Time  6    Period  Weeks    Status  New    Target Date  10/14/17      PT LONG TERM GOAL #3   Title  After 6 weeks patient will demonstrate improve funcitonal hiop strength AEB 5xSTS hands free in <18seconds.     Time  6    Period  Weeks    Status  New    Target Date  10/14/17       PT LONG TERM GOAL #4   Title  After 6 weeks patient will demonstrate corrct form and report good compliance wiht advanced HEP for DC to continue  progress in hip strengthing fro next 3-6 months.     Time  6    Period  Weeks    Status  New    Target Date  10/14/17            Plan - 09/09/17 1412    Clinical Impression Statement  Patient reports that her groin pain has drastically decreased, and that she is more concerned with weakness in the L hip at this time, as she is unable to ambulate up stairs with reciprocal gait pattern. She points to her pain at the anterior hip at the hip flexor insertion today and reports pain relief and "stretch sensation" with passive Thomas stretch, and standing hip flexion stretch. Following passive and active stretching patient is able to complete glute med exercises with only weakness limiting exercise. PT worked on concentric and eccentric glute med strength with manual resistance for clamshell exercise, and was able to complete sidelying abduction with consistent verbal and tactile cuing for pelvic alignment. Patient will continue to benefit from skilled PT to address strength deficits to return to prior level of function.    History and Personal Factors relevant to plan of care:  Under CA treatment and unable to take NSAIDs, could contact referring MD about use of iontophoresis as needed for trochanteric symptoms    Clinical Presentation  Evolving    Clinical Decision Making  Moderate    Rehab Potential  Fair    Clinical Impairments Affecting Rehab Potential  chronicicty of deficits in strength     PT Frequency  2x / week    PT Duration  6 weeks    PT Treatment/Interventions  ADLs/Self Care Home Management;Balance training;Gait training;Stair training;Cryotherapy;Iontophoresis 4mg /ml Dexamethasone;Moist Heat;Therapeutic exercise;Therapeutic activities;Functional mobility training    PT Next Visit Plan  Review new HEP progression, continue with functional  glute strengthening    PT Home Exercise Plan  Supine bridging, frog stretch 30sec holds, standing hip flexion stretch 30sec, clamshells       Patient will benefit from skilled therapeutic intervention in order to improve the following deficits and impairments:  Abnormal gait, Obesity, Decreased activity tolerance, Pain, Increased fascial restricitons, Decreased balance, Difficulty walking, Increased muscle spasms, Improper body mechanics  Visit Diagnosis: Pain in left hip  Muscle weakness (generalized)     Problem List Patient Active Problem List   Diagnosis Date Noted  . Counseling regarding goals of care 11/26/2016  . Encounter for monitoring cardiotoxic drug therapy 02/02/2016  . Breast cancer in female [facility]) 12/13/2015  . Peptic ulcer disease 12/12/2015  . DDD (degenerative disc disease), lumbosacral 12/12/2015  . Hyperlipidemia 12/12/2015  . Acute anxiety 12/12/2015  . Insomnia 12/12/2015  . Allergic rhinitis 12/12/2015  . GERD (gastroesophageal reflux disease) 12/12/2015  . Internal hemorrhoid 12/12/2015  . OA (osteoarthritis) 12/12/2015  . Osteopenia 12/12/2015  . Hypothyroid 04/11/2015  . Benign essential HTN 02/01/2015  . Retroperitoneal fibrosis   . Combined fat and carbohydrate induced hyperlipemia 01/04/2015  . Awareness of heartbeats 01/04/2015  . Beat, premature ventricular 01/04/2015  . Ovary cancer, unspecified laterality ([place]) 12/16/2014  . MI (mitral incompetence) 09/02/2014  . AF (paroxysmal atrial fibrillation) ([place]) 09/02/2014    Shelton Silvas, PT, DPT Shelton Silvas 09/09/2017, 2:28 PM  [facility] [address], [place], [address] Phone: [phone]   Fax:  [phone]  Name: Jamie Conner MRN: [id number] Date of Birth: 07/31/1941

## 2017-09-09 NOTE — Patient Outreach (Signed)
HTA High Risk Screening cal attempted, however, the member was not at home. I have left a message and requested a return call.  Jamie Conner. Myrtie Neither, MSN, [facility] Gerontological Nurse Practitioner [facility] Care Management [phone]

## 2017-09-09 NOTE — Patient Outreach (Signed)
High Risk HTA pt outreach. I was able to speak to [name] briefly, she was going to her therapy appointment. She asked if I could call later which I agreed to do.  Jamie Conner. Myrtie Neither, MSN, [facility] Gerontological Nurse Practitioner [facility] Care Management [phone]

## 2017-09-10 NOTE — Progress Notes (Signed)
Gynecologic Oncology Interval Visit   Referring Provider: Blain Pais, MD.  Chief Concern: Platinum-resistant recurrent stage IIIc ovarian cancer  Subjective:  Jamie Conner is a 77 y.o. female who is seen for platinum-resistant recurrent stage IIIc ovarian cancer.   She was recently seen by Dr. Rogue Bussing on 08/21/2017 for maintenance bevacizumab. She had received 6 cycles of paclitaxel and bevacizumab 03/04/2017 followed by maintenance bevacizumab since August 2018.   See ROS, her main symptom is vulvar irritation with urination for a few days after she receives bevaicuzmab. She also had an elevated SBP last night of 160 with headache.   CA125 stable  Ref Range & Units 2wk ago 77moago 453mogo 85m98moo  Cancer Antigen (CA) 125 0.0 - 38.1 U/mL 19.6  13.9 CM 14.3 CM 14.9 CM   Since her last visit she had the following imaging studies:  MRI pelvis on 07/09/2017 No evidence of recurrent or metastatic carcinoma within the pelvis. Stable 1.5 cm right vaginal wall cyst, consistent with a benign Gardner's duct or Bartholin's gland cyst.  MRI lumbar spine on 07/09/2017 Negative for metastatic disease. Mild lumbar degenerative change without neural impingement.  She presents today for a pelvic exam.    Gynecologic Oncology History Jamie Conner a pleasant female who is seen for postoperative visit for stage IIIc high-grade serous ovarian cancer. See prior notes for complete detail.  She also has a history of  carcinoma of breast ( left) status post lumpectomy , radiation therapy and 5 years of tamoxifen. She underwent exploratory Laparotomy, bilateral salpingo-oophorectomy, right ureterolysis, infragastric omentectomy, and optimally debulked to no gross residual  May 4th, 2016. Preop CA125 2354.0.  She started chemotherapy with carboplatinum and Taxol in dose dense fashion from Jan 02, 2015. She underwent 6 cycles of chemotherapy completed 05/09/2015. Her dose of Taxol was  reduced because of myelosuppression and fever in spite of Neulasta.  Nadir CA125 = 9.8  06/06/2015 CT scan abdomen and pelvis IMPRESSION: 1. Interval removal of the large right ovarian mass . Dramatic improvement in the appearance of mesenteric and omental implants. There is a 3 mm potential faint omental nodule at about the level of the umbilicus but for the most part the numerous prior omental and mesenteric tumor implants have resolved completely. 2. 1.8 by 1.2 cm fluid density structure along the splenic hilum, no change from prior, likely a small benign cystic lesion.  3. Other imaging findings of potential clinical significance: 3 cm periampullary duodenal diverticulum.   01/04/16 CA125 34.1  01/10/2016 CT scan chest abdomen and pelvis. 1. New nodular density 12 x 7 mm ties are seen throughout the pelvis. The largest measures 12 x 10 mm best seen on image number 105 of series. A 9 mm nodule is seen in the left pelvis best seen on image number 60 of series 5. These are concerning for possible peritoneal implants. 2. Stable 1.9 cm cystic lesion seen in splenic hilum.  Vaginal biopsy performed on 01/04/2016 negative  She was started on PLD and carboplatin   05/11/2016 CT scan abdomen and pelvis IMPRESSION: 1. No acute findings identified within the abdomen or pelvis. 2. Peritoneal nodules within the right side of pelvis are stable the slightly increased in size from previous exam. No new areas of disease identified.  She has completed 6 cycles of  PLD and carboplatin therapy and imaging revealed progressive disease.   08/15/2016 CT scan abdomen and pelvis 1. Study demonstrates slight interval growth of several peritoneal implants  in the low anatomic pelvis indicative of slight progression of disease. No other new peritoneal deposits are noted, and there is no other metastatic disease noted elsewhere in the chest, abdomen or pelvis. 2. Colonic diverticulosis without evidence of acute  diverticulitis at this time. 3. Aortic atherosclerosis, in addition to left main coronary artery disease. Assessment for potential risk factor modification, dietary therapy or pharmacologic therapy may be warranted, if clinically indicated. 4. Additional incidental findings, as above.  Rucaparib denied by insurance.   09/24/2016 She was started on paclitaxel and bevacizumab and received 6 cycles of paclitaxel and bevacizumab 03/04/2017 with evidence of response  CT Abdomen and Pelvis 03/28/2017 IMPRESSION: 1. Status post hysterectomy, without recurrent or metastatic disease. 2.  Possible constipation. 3.  Tiny hiatal hernia. 4. Hepatic hemangiomas. 5. Cystic lesion within the right-side of the vagina is similar over prior exams and may represent a Bartholin's gland cyst.  Continued on maintenance bevacizumab since August 2018.   CA125 02/20/2016 47.5 03/21/2016 31.1 05/16/2016 37.5 06/13/2016 44.8 06/27/2016 50.4 08/15/2016 64.5 09/24/2016 101.7 10/22/2016 18.2 11/05/2016 13. 0 11/26/2016 12.6 01/21/2017 12.8 02/25/2017 13.3 04/01/2017 14.9 05/13/2017 14.3 06/24/2017 13.9 08/21/2017 19.6  Genetic testing: ATM mutation [id number].  HRD testing negative ([facility])  I spoke with Dr. Lisbeth Ply at [facility] and he recommended genetic counseling for the patient and possibly other family members. He provided a phone number for them to call to schedule that appointment. The number is [phone]. Of note her daughter has tested positive for an ATM mutation carrier and she requested recommendations for who to see at [facility].    Problem List: Patient Active Problem List   Diagnosis Date Noted  . Counseling regarding goals of care 11/26/2016  . Encounter for monitoring cardiotoxic drug therapy 02/02/2016  . Breast cancer in female [facility]) 12/13/2015  . Peptic ulcer disease 12/12/2015  . DDD (degenerative disc disease), lumbosacral 12/12/2015  . Hyperlipidemia 12/12/2015  . Acute anxiety 12/12/2015  .  Insomnia 12/12/2015  . Allergic rhinitis 12/12/2015  . GERD (gastroesophageal reflux disease) 12/12/2015  . Internal hemorrhoid 12/12/2015  . OA (osteoarthritis) 12/12/2015  . Osteopenia 12/12/2015  . Hypothyroid 04/11/2015  . Benign essential HTN 02/01/2015  . Retroperitoneal fibrosis   . Combined fat and carbohydrate induced hyperlipemia 01/04/2015  . Awareness of heartbeats 01/04/2015  . Beat, premature ventricular 01/04/2015  . Malignant neoplasm of ovary ([place]) 12/16/2014  . MI (mitral incompetence) 09/02/2014  . AF (paroxysmal atrial fibrillation) ([place]) 09/02/2014    Past Medical History: Past Medical History:  Diagnosis Date  . Atrial fibrillation ([place])   . Breast cancer ([place]) 1998  . Cancer of breast ([place]) 1998   Left/radiation  . CINV (chemotherapy-induced nausea and vomiting)   . GERD (gastroesophageal reflux disease)   . Hyperlipidemia   . Hypothyroidism   . Mitral insufficiency   . Ovarian cancer ([place])    s/p BSO optimal tumor debulking May 2016/chemo  . Personal history of chemotherapy since 2016   ovarian cancer  . Personal history of radiation therapy 1998  . PVC (premature ventricular contraction)     Past Surgical History: Past Surgical History:  Procedure Laterality Date  . ABDOMINAL HYSTERECTOMY    . BILATERAL SALPINGOOPHORECTOMY  May 2016   with optimal tumor debulking   . BREAST LUMPECTOMY Left 1998  . BREAST SURGERY     Left  . CESAREAN SECTION     x2  . CHOLECYSTECTOMY    . COLONOSCOPY  2006  . DEBULKING N/A 12/22/2014  Procedure: DEBULKING;  Surgeon: Gillis Ends, MD;  Location: [facility] ORS;  Service: Gynecology;  Laterality: N/A;  . LAPAROTOMY N/A 12/22/2014   Procedure: EXPLORATORY LAPAROTOMY;  Surgeon: Robert Bellow, MD;  Location: [facility] ORS;  Service: General;  Laterality: N/A;  . LAPAROTOMY WITH STAGING N/A 12/22/2014   Procedure: LAPAROTOMY WITH STAGING;  Surgeon: Gillis Ends, MD;  Location: [facility] ORS;  Service:  Gynecology;  Laterality: N/A;  . PERIPHERAL VASCULAR CATHETERIZATION Left 12/22/2014   Procedure: PORTA CATH INSERTION;  Surgeon: Robert Bellow, MD;  Location: [facility] ORS;  Service: General;  Laterality: Left;  . PORTACATH PLACEMENT Right 12/22/2014   Procedure: INSERTION PORT-A-CATH;  Surgeon: Robert Bellow, MD;  Location: [facility] ORS;  Service: General;  Laterality: Right;  . SALPINGOOPHORECTOMY Bilateral 12/22/2014   Procedure: SALPINGO OOPHORECTOMY;  Surgeon: Gillis Ends, MD;  Location: [facility] ORS;  Service: Gynecology;  Laterality: Bilateral;  . UPPER GI ENDOSCOPY  09/25/04   hiatus hernia, schatzki ring and a single gastric polyp    Past Gynecologic History:  See HPI  OB History:  OB History  Gravida Para Term Preterm AB Living  2         2  SAB TAB Ectopic Multiple Live Births               # Outcome Date GA Lbr Len/2nd Weight Sex Delivery Anes PTL Lv  2 Gravida           1 Gravida             Obstetric Comments  1st Menstrual Cycle:  14  1st Pregnancy:  61    Family History: Family History  Problem Relation Age of Onset  . Cancer Mother        breast, throat, and stomach  . Breast cancer Mother   . Heart disease Father   . Pulmonary embolism Sister   . Cancer Brother 60       angiosarcoma of the chest; carcinoid small intestinal tumor  . Hypertension Brother   . Cancer Maternal Aunt        breast cancer  . Cancer Maternal Grandfather 43       pancreatic    Social History: Social History   Socioeconomic History  . Marital status: Married    Spouse name: Not on file  . Number of children: Not on file  . Years of education: Not on file  . Highest education level: Not on file  Social Needs  . Financial resource strain: Not on file  . Food insecurity - worry: Not on file  . Food insecurity - inability: Not on file  . Transportation needs - medical: Not on file  . Transportation needs - non-medical: Not on file  Occupational History  . Not on file   Tobacco Use  . Smoking status: Never Smoker  . Smokeless tobacco: Never Used  Substance and Sexual Activity  . Alcohol use: No  . Drug use: No  . Sexual activity: Not Currently  Other Topics Concern  . Not on file  Social History Narrative  . Not on file    Allergies: Allergies  Allergen Reactions  . Carafate [Sucralfate] Rash  . Sulfa Antibiotics Rash    Current Medications: Current Outpatient Medications  Medication Sig Dispense Refill  . acetaminophen (TYLENOL) 500 MG chewable tablet Chew 500 mg by mouth every 6 (six) hours as needed for pain.    . Cholecalciferol (VITAMIN D) 2000 units tablet Take 1 tablet (  2,000 Units total) by mouth daily. 30 tablet 12  . Docosanol (ABREVA EX) Apply 1 application topically as needed. Fever blisters    . lactulose (CHRONULAC) 10 GM/15ML solution TAKE 15MLS (10G TOTAL) BY MOUTH THREE TIMES A DAY AS DIRECTED BY PHYSICIAN. 240 mL 1  . levothyroxine (SYNTHROID, LEVOTHROID) 50 MCG tablet TAKE 1 TABLET EVERY DAY ON AN EMPTY STOMACH WITH A GLASS OF WATER AT LEAST 30-60 MINUTES BEFORE BREAKFAST 30 tablet 12  . lidocaine-prilocaine (EMLA) cream Apply to port area 30-45 mins prior to chemo. 30 g 5  . lisinopril (PRINIVIL,ZESTRIL) 10 MG tablet Take 1 tablet (10 mg total) by mouth daily. 30 tablet 4  . lisinopril-hydrochlorothiazide (PRINZIDE,ZESTORETIC) 20-25 MG tablet Take 1 tablet by mouth daily. 30 tablet 0  . LORazepam (ATIVAN) 0.5 MG tablet Take 0.5 tablets (0.25 mg total) by mouth at bedtime. 30 tablet 4  . Lysine 500 MG TABS Take 1 tablet by mouth daily as needed.     . Multiple Vitamin (MULTIVITAMIN) capsule Take 1 capsule by mouth daily.    . pantoprazole (PROTONIX) 40 MG tablet TAKE 1 TABLET BY MOUTH DAILY 30 tablet 11  . polyethylene glycol powder (GLYCOLAX/MIRALAX) powder TAKE 17GM (LINE INSIDE CAP) IN EIGHT OUNCES OF WATER OR OTHER LIQUID DAILY 255 g 2  . propafenone (RYTHMOL) 225 MG tablet Take 225 mg by mouth 2 (two) times daily.    .  ondansetron (ZOFRAN) 4 MG tablet Take 1 tablet (4 mg total) by mouth every 8 (eight) hours as needed for nausea or vomiting. (Patient not taking: Reported on 09/11/2017) 30 tablet 3  . prochlorperazine (COMPAZINE) 10 MG tablet Take 1 tablet (10 mg total) by mouth every 6 (six) hours as needed for nausea or vomiting. (Patient not taking: Reported on 09/11/2017) 30 tablet 0  . traMADol (ULTRAM) 50 MG tablet Take 1 tablet (50 mg total) by mouth every 6 (six) hours as needed. (Patient not taking: Reported on 09/11/2017) 30 tablet 0   No current facility-administered medications for this visit.    Facility-Administered Medications Ordered in Other Visits  Medication Dose Route Frequency Provider Last Rate Last Dose  . heparin lock flush 100 unit/mL  500 Units Intravenous Once Charlaine Dalton R, MD      . sodium chloride 0.9 % injection 10 mL  10 mL Intracatheter PRN Forest Gleason, MD   10 mL at 02/07/15 0930  . sodium chloride flush (NS) 0.9 % injection 10 mL  10 mL Intravenous PRN Cammie Sickle, MD   10 mL at 09/11/17 4098    Review of Systems General: fatigue; no weakness  HEENT: voice changes, dysguesia, nose bleed, halitosis  Lungs: negative for SOB or cough  Cardiac: no complaints  GI: using Miralax daily for constipation  GU: vulvar itching with urination a few days after bevacizumab  Musculoskeletal: back pain and sciatica improved with PT  Extremities: no complaints  Skin: no complaints  Neuro: peripheral neuropathy, stable  Endocrine: no complaints  Psych: no complaints       Objective:  Physical Examination:  BP 123/78   Pulse 86   Temp (!) 97.1 F (36.2 C) (Tympanic)   Resp 18   Ht '5\' 4"'$  (1.626 m)   Wt 192 lb 9.6 oz (87.4 kg)   BMI 33.06 kg/m . Weight is stable.    ECOG Performance Status: 1 - Symptomatic but completely ambulatory   Pelvic exam; Chaperoned by nurse: Pelvic: Vulva: normal appearing vulva with no masses, tenderness or lesions,  atrophy;  Vagina: normal left paravaginal cyst approximately mid portion of the vagina, no discharge, on BME the left paravaginal cyst was stable and benign appearing; BME: negative for masses or nodularity beyond the vaginal cuff; Uterus and Cervix: surgically absent; Rectal: confirms. The previously palpated 1.5 cm nodule near right vaginal fornix (on 10/10/2016) was no longer appreciated again today.   Lab Review CA125 as noted above  Radiologic Imaging: n/a    Assessment:  Jamie Conner is a 77 y.o. female diagnosed with optimally debulked stage IIIc  high-grade serous ovarian cancer, initially diagnosed 12/22/2014 s/p chemotherapy with complete response. Recurrent ovarian cancer diagnosed 01/10/2016.  ATM mutation positive. HRD negative. Extensive family history of cancer (breast, pancreatic, throat, gastric, adenosarcoma of the chest, carcinoid tumor of the small bowel) in several first degree relatives.  Vaginal lesion, granulation tissue.  Recurrent platinum-sensitive ovarian cancer with stable disease on PLD/carboplatin based on RECIST criteria with ultimate progression. Platinum-resistant disease initiated on paclitaxel and bevacizumab with evidence of complete clinical response based on exam, CT scan, and CA125. Tolerating therapy maintenance bevacizumab adequately.   HTN, moderately controlled.   Vulvar symptoms possible due to bevacizumab combined with atrophy.   Voice changes, dysguesia, nose bleed, and halitosis due to bevacizumab. Halitosis may also be due to dental issues  Left-sided Sciatica with ambulatory impairment improved with PT. MRI c/w DJD.  Plan:   Problem List Items Addressed This Visit      Genitourinary   Malignant neoplasm of ovary ([place]) - Primary (Chronic)    Other Visit Diagnoses    Vulvar irritation         Continue current therapy with Dr. Rogue Bussing. I reviewed BP precautions and she will discuss with Dr. Rogue Bussing. She will also discuss her other side  effects with him. I recommend citrus drinks/lozenges for the dysguesia. I recommended she follow up with her dentist and if she needs dental work to discuss with Dr. Rogue Bussing regarding dose/timing of bevacizumab.   Vulvar irritation most likely chemical irritant due to bevacizumab and exacerbating vulvar atrophy. Recommended diluting urine to reduce symptoms.   Sciatica continue PT.   We will continue to follow closely with repeat exam in 3 months.   Gillis Ends, MD    CC:  Blain Pais, MD.

## 2017-09-11 ENCOUNTER — Inpatient Hospital Stay: Payer: PPO

## 2017-09-11 ENCOUNTER — Inpatient Hospital Stay (HOSPITAL_BASED_OUTPATIENT_CLINIC_OR_DEPARTMENT_OTHER): Payer: PPO | Admitting: Obstetrics and Gynecology

## 2017-09-11 ENCOUNTER — Inpatient Hospital Stay: Payer: PPO | Admitting: Internal Medicine

## 2017-09-11 ENCOUNTER — Encounter: Payer: PPO | Admitting: Physical Therapy

## 2017-09-11 VITALS — BP 123/78 | HR 86 | Temp 97.1°F | Resp 18 | Ht 64.0 in | Wt 192.6 lb

## 2017-09-11 DIAGNOSIS — M1990 Unspecified osteoarthritis, unspecified site: Secondary | ICD-10-CM

## 2017-09-11 DIAGNOSIS — Z90710 Acquired absence of both cervix and uterus: Secondary | ICD-10-CM | POA: Diagnosis not present

## 2017-09-11 DIAGNOSIS — M25552 Pain in left hip: Secondary | ICD-10-CM

## 2017-09-11 DIAGNOSIS — C786 Secondary malignant neoplasm of retroperitoneum and peritoneum: Secondary | ICD-10-CM | POA: Diagnosis not present

## 2017-09-11 DIAGNOSIS — I4891 Unspecified atrial fibrillation: Secondary | ICD-10-CM

## 2017-09-11 DIAGNOSIS — E785 Hyperlipidemia, unspecified: Secondary | ICD-10-CM

## 2017-09-11 DIAGNOSIS — Z9221 Personal history of antineoplastic chemotherapy: Secondary | ICD-10-CM

## 2017-09-11 DIAGNOSIS — C569 Malignant neoplasm of unspecified ovary: Secondary | ICD-10-CM | POA: Diagnosis not present

## 2017-09-11 DIAGNOSIS — Z853 Personal history of malignant neoplasm of breast: Secondary | ICD-10-CM

## 2017-09-11 DIAGNOSIS — E039 Hypothyroidism, unspecified: Secondary | ICD-10-CM

## 2017-09-11 DIAGNOSIS — Z800 Family history of malignant neoplasm of digestive organs: Secondary | ICD-10-CM

## 2017-09-11 DIAGNOSIS — Z90722 Acquired absence of ovaries, bilateral: Secondary | ICD-10-CM | POA: Diagnosis not present

## 2017-09-11 DIAGNOSIS — Z923 Personal history of irradiation: Secondary | ICD-10-CM | POA: Diagnosis not present

## 2017-09-11 DIAGNOSIS — Z5112 Encounter for antineoplastic immunotherapy: Secondary | ICD-10-CM | POA: Diagnosis not present

## 2017-09-11 DIAGNOSIS — N9089 Other specified noninflammatory disorders of vulva and perineum: Secondary | ICD-10-CM

## 2017-09-11 DIAGNOSIS — K219 Gastro-esophageal reflux disease without esophagitis: Secondary | ICD-10-CM

## 2017-09-11 DIAGNOSIS — K449 Diaphragmatic hernia without obstruction or gangrene: Secondary | ICD-10-CM

## 2017-09-11 DIAGNOSIS — I10 Essential (primary) hypertension: Secondary | ICD-10-CM | POA: Diagnosis not present

## 2017-09-11 DIAGNOSIS — Z79899 Other long term (current) drug therapy: Secondary | ICD-10-CM

## 2017-09-11 DIAGNOSIS — N898 Other specified noninflammatory disorders of vagina: Secondary | ICD-10-CM | POA: Diagnosis not present

## 2017-09-11 DIAGNOSIS — D1803 Hemangioma of intra-abdominal structures: Secondary | ICD-10-CM | POA: Diagnosis not present

## 2017-09-11 DIAGNOSIS — N750 Cyst of Bartholin's gland: Secondary | ICD-10-CM | POA: Diagnosis not present

## 2017-09-11 DIAGNOSIS — Z803 Family history of malignant neoplasm of breast: Secondary | ICD-10-CM

## 2017-09-11 LAB — CBC WITH DIFFERENTIAL/PLATELET
BASOS PCT: 1 %
Basophils Absolute: 0 10*3/uL (ref 0–0.1)
EOS ABS: 0.2 10*3/uL (ref 0–0.7)
EOS PCT: 5 %
HCT: 41.5 % (ref 35.0–47.0)
HEMOGLOBIN: 13.9 g/dL (ref 12.0–16.0)
LYMPHS ABS: 2.1 10*3/uL (ref 1.0–3.6)
Lymphocytes Relative: 43 %
MCH: 31.8 pg (ref 26.0–34.0)
MCHC: 33.5 g/dL (ref 32.0–36.0)
MCV: 95 fL (ref 80.0–100.0)
MONOS PCT: 9 %
Monocytes Absolute: 0.4 10*3/uL (ref 0.2–0.9)
NEUTROS PCT: 42 %
Neutro Abs: 2 10*3/uL (ref 1.4–6.5)
PLATELETS: 238 10*3/uL (ref 150–440)
RBC: 4.37 MIL/uL (ref 3.80–5.20)
RDW: 13.9 % (ref 11.5–14.5)
WBC: 4.8 10*3/uL (ref 3.6–11.0)

## 2017-09-11 LAB — COMPREHENSIVE METABOLIC PANEL
ALK PHOS: 59 U/L (ref 38–126)
ALT: 15 U/L (ref 14–54)
AST: 27 U/L (ref 15–41)
Albumin: 3.4 g/dL — ABNORMAL LOW (ref 3.5–5.0)
Anion gap: 7 (ref 5–15)
BUN: 20 mg/dL (ref 6–20)
CALCIUM: 9.1 mg/dL (ref 8.9–10.3)
CHLORIDE: 104 mmol/L (ref 101–111)
CO2: 25 mmol/L (ref 22–32)
CREATININE: 1.06 mg/dL — AB (ref 0.44–1.00)
GFR calc non Af Amer: 50 mL/min — ABNORMAL LOW (ref >60)
GFR, EST AFRICAN AMERICAN: 58 mL/min — AB (ref >60)
Glucose, Bld: 126 mg/dL — ABNORMAL HIGH (ref 65–99)
Potassium: 4.1 mmol/L (ref 3.5–5.1)
SODIUM: 136 mmol/L (ref 135–145)
Total Bilirubin: 0.5 mg/dL (ref 0.3–1.2)
Total Protein: 6.7 g/dL (ref 6.5–8.1)

## 2017-09-11 LAB — URINALYSIS, COMPLETE (UACMP) WITH MICROSCOPIC
Bacteria, UA: NONE SEEN
Bilirubin Urine: NEGATIVE
Glucose, UA: NEGATIVE mg/dL
HGB URINE DIPSTICK: NEGATIVE
Ketones, ur: NEGATIVE mg/dL
NITRITE: NEGATIVE
Protein, ur: NEGATIVE mg/dL
SPECIFIC GRAVITY, URINE: 1.013 (ref 1.005–1.030)
pH: 5 (ref 5.0–8.0)

## 2017-09-11 MED ORDER — HEPARIN SOD (PORK) LOCK FLUSH 100 UNIT/ML IV SOLN
INTRAVENOUS | Status: AC
  Filled 2017-09-11: qty 5

## 2017-09-11 MED ORDER — SODIUM CHLORIDE 0.9% FLUSH
10.0000 mL | INTRAVENOUS | Status: DC | PRN
  Administered 2017-09-11: 10 mL via INTRAVENOUS
  Filled 2017-09-11: qty 10

## 2017-09-11 MED ORDER — SODIUM CHLORIDE 0.9 % IV SOLN
Freq: Once | INTRAVENOUS | Status: AC
  Administered 2017-09-11: 11:00:00 via INTRAVENOUS
  Filled 2017-09-11: qty 1000

## 2017-09-11 MED ORDER — SODIUM CHLORIDE 0.9 % IV SOLN
15.0000 mg/kg | Freq: Once | INTRAVENOUS | Status: AC
  Administered 2017-09-11: 1300 mg via INTRAVENOUS
  Filled 2017-09-11: qty 4

## 2017-09-11 MED ORDER — HEPARIN SOD (PORK) LOCK FLUSH 100 UNIT/ML IV SOLN
500.0000 [IU] | Freq: Once | INTRAVENOUS | Status: AC
  Administered 2017-09-11: 500 [IU] via INTRAVENOUS

## 2017-09-11 NOTE — Progress Notes (Signed)
[place] OFFICE PROGRESS NOTE  Patient Care Team: Jerrol Banana., MD as PCP - General (Family Medicine) Gillis Ends, MD as Referring Physician (Obstetrics and Gynecology) Bary Castilla Forest Gleason, MD as Consulting Physician (General Surgery)  Ovarian cancer [facility])   Staging form: Ovary, AJCC 7th Edition     Clinical: Stage IIIC (T3c, N0, M0) - Unsigned   Oncology History   # April- MAY 2016- OVARIAN CANCER STAGE IIIC; CA-125 +2300+;  s/p OPTIMAL DEBULKING SURGERY   # MAY 2016[name] [finished in Sep 19th 2017]  # MAY CT 2017- RECURRENT/Peritoneal carcinomatosis/pelvic implant ~1.2cm/Ca-125-34;   # July 3rd- CARBO-DOXIL q 4W [with neulasta]; CT May 11 2016- slight increase peritoneal / stable nodules. Cont chemo [finished DEC 2017]. DEC 28th 2017 CT- Increase in peritoneal deposits; increasing Ca 125 [60s];  # Jan 2018- carbo-Taxol-Avastin x6 cycles; Aug 2018- CR; Aug 2018- start Avastin Maintenance  # G-1-2 hand foot syndrome-   # LEFT BREAST CA s/p Lumpec & RT s/p TAM   # Afib [cardiology]; JUNE 2017- MUGA 62%  MOLECULAR TESTING- ATM genetic mutation; TUMOR BRCA- NEG; Myriad genomic instability- NEGATIVE     Malignant neoplasm of ovary (HCC)    INTERVAL HISTORY:  77 year old female patient with above history of ovarian cancer recurrent Platinum Refractory ovarian cancer [dec 2017]. Currently on maintenance Avastin is here for follow-up.  Patient's left pain is improved.  She is undergoing physical therapy. Denies any headaches.  Denies bloody nose.  No abdominal pain.  No constipation.  No swelling in the legs.  REVIEW OF SYSTEMS:  A complete 10 point review of system is done which is negative except mentioned above/history of present illness.   PAST MEDICAL HISTORY :  Past Medical History:  Diagnosis Date  . Atrial fibrillation ([place])   . Breast cancer ([place]) 1998  . Cancer of breast ([place]) 1998   Left/radiation  . CINV  (chemotherapy-induced nausea and vomiting)   . GERD (gastroesophageal reflux disease)   . Hyperlipidemia   . Hypothyroidism   . Mitral insufficiency   . Ovarian cancer ([place])    s/p BSO optimal tumor debulking May 2016/chemo  . Personal history of chemotherapy since 2016   ovarian cancer  . Personal history of radiation therapy 1998  . PVC (premature ventricular contraction)     PAST SURGICAL HISTORY :   Past Surgical History:  Procedure Laterality Date  . ABDOMINAL HYSTERECTOMY    . BILATERAL SALPINGOOPHORECTOMY  May 2016   with optimal tumor debulking   . BREAST LUMPECTOMY Left 1998  . BREAST SURGERY     Left  . CESAREAN SECTION     x2  . CHOLECYSTECTOMY    . COLONOSCOPY  2006  . DEBULKING N/A 12/22/2014   Procedure: DEBULKING;  Surgeon: Gillis Ends, MD;  Location: [facility] ORS;  Service: Gynecology;  Laterality: N/A;  . LAPAROTOMY N/A 12/22/2014   Procedure: EXPLORATORY LAPAROTOMY;  Surgeon: Robert Bellow, MD;  Location: [facility] ORS;  Service: General;  Laterality: N/A;  . LAPAROTOMY WITH STAGING N/A 12/22/2014   Procedure: LAPAROTOMY WITH STAGING;  Surgeon: Gillis Ends, MD;  Location: [facility] ORS;  Service: Gynecology;  Laterality: N/A;  . PERIPHERAL VASCULAR CATHETERIZATION Left 12/22/2014   Procedure: PORTA CATH INSERTION;  Surgeon: Robert Bellow, MD;  Location: [facility] ORS;  Service: General;  Laterality: Left;  . PORTACATH PLACEMENT Right 12/22/2014   Procedure: INSERTION PORT-A-CATH;  Surgeon: Robert Bellow, MD;  Location: [facility]S;  Service: General;  Laterality: Right;  . SALPINGOOPHORECTOMY Bilateral 12/22/2014   Procedure: SALPINGO OOPHORECTOMY;  Surgeon: Gillis Ends, MD;  Location: [facility] ORS;  Service: Gynecology;  Laterality: Bilateral;  . UPPER GI ENDOSCOPY  09/25/04   hiatus hernia, schatzki ring and a single gastric polyp    FAMILY HISTORY :   Family History  Problem Relation Age of Onset  . Cancer Mother        breast, throat, and  stomach  . Breast cancer Mother   . Heart disease Father   . Pulmonary embolism Sister   . Cancer Brother 60       angiosarcoma of the chest; carcinoid small intestinal tumor  . Hypertension Brother   . Cancer Maternal Aunt        breast cancer  . Cancer Maternal Grandfather 76       pancreatic    SOCIAL HISTORY:   Social History   Tobacco Use  . Smoking status: Never Smoker  . Smokeless tobacco: Never Used  Substance Use Topics  . Alcohol use: No  . Drug use: No    ALLERGIES:  is allergic to carafate [sucralfate] and sulfa antibiotics.  MEDICATIONS:  Current Outpatient Medications  Medication Sig Dispense Refill  . acetaminophen (TYLENOL) 500 MG chewable tablet Chew 500 mg by mouth every 6 (six) hours as needed for pain.    . Cholecalciferol (VITAMIN D) 2000 units tablet Take 1 tablet (2,000 Units total) by mouth daily. 30 tablet 12  . Docosanol (ABREVA EX) Apply 1 application topically as needed. Fever blisters    . lactulose (CHRONULAC) 10 GM/15ML solution TAKE 15MLS (10G TOTAL) BY MOUTH THREE TIMES A DAY AS DIRECTED BY PHYSICIAN. 240 mL 1  . levothyroxine (SYNTHROID, LEVOTHROID) 50 MCG tablet TAKE 1 TABLET EVERY DAY ON AN EMPTY STOMACH WITH A GLASS OF WATER AT LEAST 30-60 MINUTES BEFORE BREAKFAST 30 tablet 12  . lidocaine-prilocaine (EMLA) cream Apply to port area 30-45 mins prior to chemo. 30 g 5  . lisinopril (PRINIVIL,ZESTRIL) 10 MG tablet Take 1 tablet (10 mg total) by mouth daily. 30 tablet 4  . lisinopril-hydrochlorothiazide (PRINZIDE,ZESTORETIC) 20-25 MG tablet Take 1 tablet by mouth daily. 30 tablet 0  . LORazepam (ATIVAN) 0.5 MG tablet Take 0.5 tablets (0.25 mg total) by mouth at bedtime. 30 tablet 4  . Lysine 500 MG TABS Take 1 tablet by mouth daily as needed.     . Multiple Vitamin (MULTIVITAMIN) capsule Take 1 capsule by mouth daily.    . ondansetron (ZOFRAN) 4 MG tablet Take 1 tablet (4 mg total) by mouth every 8 (eight) hours as needed for nausea or vomiting.  (Patient not taking: Reported on 09/11/2017) 30 tablet 3  . pantoprazole (PROTONIX) 40 MG tablet TAKE 1 TABLET BY MOUTH DAILY 30 tablet 11  . polyethylene glycol powder (GLYCOLAX/MIRALAX) powder TAKE 17GM (LINE INSIDE CAP) IN EIGHT OUNCES OF WATER OR OTHER LIQUID DAILY 255 g 2  . prochlorperazine (COMPAZINE) 10 MG tablet Take 1 tablet (10 mg total) by mouth every 6 (six) hours as needed for nausea or vomiting. (Patient not taking: Reported on 09/11/2017) 30 tablet 0  . propafenone (RYTHMOL) 225 MG tablet Take 225 mg by mouth 2 (two) times daily.    . traMADol (ULTRAM) 50 MG tablet Take 1 tablet (50 mg total) by mouth every 6 (six) hours as needed. (Patient not taking: Reported on 09/11/2017) 30 tablet 0   No current facility-administered medications for this visit.    Facility-Administered Medications  Ordered in Other Visits  Medication Dose Route Frequency Provider Last Rate Last Dose  . sodium chloride 0.9 % injection 10 mL  10 mL Intracatheter PRN Forest Gleason, MD   10 mL at 02/07/15 0930    PHYSICAL EXAMINATION: ECOG PERFORMANCE STATUS: 0 - Asymptomatic  There were no vitals taken for this visit.  There were no vitals filed for this visit.  GENERAL: Well-nourished well-developed; Alert, no distress and comfort. Accompanied by her husband. EYES: no pallor or icterus OROPHARYNX: no thrush or ulceration; good dentition  NECK: supple, no masses felt LYMPH:  no palpable lymphadenopathy in the cervical, axillary or inguinal regions.  LUNGS: clear to auscultation and  No wheeze or crackles HEART/CVS: regular rate & rhythm and no murmurs; No lower extremity edema  ABDOMEN:abdomen soft, non-tender and normal bowel sounds Musculoskeletal:no cyanosis of digits and no clubbing. No redness, temperature warm and well perfused.  PSYCH: alert & oriented x 3 with fluent speech NEURO: no focal motor/sensory deficits SKIN:  Normal. No rash noted.   LABORATORY DATA:  I have reviewed the data as  listed    Component Value Date/Time   NA 136 09/11/2017 0822   NA 140 12/14/2014 1037   K 4.1 09/11/2017 0822   K 4.4 12/14/2014 1037   CL 104 09/11/2017 0822   CL 106 12/14/2014 1037   CO2 25 09/11/2017 0822   CO2 26 12/14/2014 1037   GLUCOSE 126 (H) 09/11/2017 0822   GLUCOSE 87 12/14/2014 1037   BUN 20 09/11/2017 0822   BUN 12 12/14/2014 1037   CREATININE 1.06 (H) 09/11/2017 0822   CREATININE 0.97 12/14/2014 1037   CALCIUM 9.1 09/11/2017 0822   CALCIUM 9.2 12/14/2014 1037   PROT 6.7 09/11/2017 0822   PROT 7.3 12/14/2014 1037   ALBUMIN 3.4 (L) 09/11/2017 0822   ALBUMIN 3.8 12/14/2014 1037   AST 27 09/11/2017 0822   AST 20 12/14/2014 1037   ALT 15 09/11/2017 0822   ALT 11 (L) 12/14/2014 1037   ALKPHOS 59 09/11/2017 0822   ALKPHOS 138 (H) 12/14/2014 1037   BILITOT 0.5 09/11/2017 0822   BILITOT 0.5 12/14/2014 1037   GFRNONAA 50 (L) 09/11/2017 0822   GFRNONAA 59 (L) 12/14/2014 1037   GFRAA 58 (L) 09/11/2017 0822   GFRAA >60 12/14/2014 1037    No results found for: SPEP, UPEP  Lab Results  Component Value Date   WBC 4.8 09/11/2017   NEUTROABS 2.0 09/11/2017   HGB 13.9 09/11/2017   HCT 41.5 09/11/2017   MCV 95.0 09/11/2017   PLT 238 09/11/2017      Chemistry      Component Value Date/Time   NA 136 09/11/2017 0822   NA 140 12/14/2014 1037   K 4.1 09/11/2017 0822   K 4.4 12/14/2014 1037   CL 104 09/11/2017 0822   CL 106 12/14/2014 1037   CO2 25 09/11/2017 0822   CO2 26 12/14/2014 1037   BUN 20 09/11/2017 0822   BUN 12 12/14/2014 1037   CREATININE 1.06 (H) 09/11/2017 0822   CREATININE 0.97 12/14/2014 1037   GLU 84 03/16/2014      Component Value Date/Time   CALCIUM 9.1 09/11/2017 0822   CALCIUM 9.2 12/14/2014 1037   ALKPHOS 59 09/11/2017 0822   ALKPHOS 138 (H) 12/14/2014 1037   AST 27 09/11/2017 0822   AST 20 12/14/2014 1037   ALT 15 09/11/2017 0822   ALT 11 (L) 12/14/2014 1037   BILITOT 0.5 09/11/2017 0822   BILITOT  0.5 12/14/2014 1037      IMPRESSION: 1. Status post hysterectomy, without recurrent or metastatic disease. 2.  Possible constipation. 3.  Tiny hiatal hernia. 4. Hepatic hemangiomas.  5. Cystic lesion within the right-side of the vagina is similar over prior exams and may represent a Bartholin's gland cyst.   Electronically Signed   By: Abigail Miyamoto M.D.   On: 03/28/2017 12:09 Results for MYLINDA, BROOK (MRN [id number]) as of 04/24/2017 09:53  Ref. Range 12/08/2014 14:59 03/14/2015 15:13 06/08/2015 11:54 07/20/2015 11:43 09/13/2015 10:30 12/14/2015 10:02 01/04/2016 13:19 02/20/2016 09:58 03/21/2016 08:57 05/16/2016 11:14 06/13/2016 09:51 06/27/2016 09:50 08/15/2016 08:55 09/24/2016 09:03 10/22/2016 08:26 11/05/2016 08:28 11/26/2016 08:28 01/21/2017 09:13 02/25/2017 08:19 04/01/2017 08:40  CA 125 Latest Ref Range: 0.0 - 38.1 U/mL 2,354.0 (H) 9.8 14.9 14.5 17.8 24.4 34.1 47.5 (H) 31.1 37.5 44.8 (H) 50.4 (H) 64.5 (H) 101.7 (H) 18.2 13.0 12.6 12.8    Cancer Antigen (CA) 125 Latest Ref Range: 0.0 - 38.1 U/mL                   13.3 14.9  Results for KHLOE, HUNKELE (MRN [id number]) as of 09/11/2017 09:51  Ref. Range 02/25/2017 08:19 04/01/2017 08:40 05/13/2017 09:10 06/24/2017 09:00 08/21/2017 10:58  Cancer Antigen (CA) 125 Latest Ref Range: 0.0 - 38.1 U/mL 13.3 14.9 14.3 13.9 19.6    RADIOGRAPHIC STUDIES: I have personally reviewed the radiological images as listed and agreed with the findings in the report. No results found.   ASSESSMENT & PLAN:  Ovarian cancer, unspecified laterality ([place]) Recurrent  Platinum refractory ovarian cancer [dec 2017] August 10 CT scan- improvement/resolution of the pelvic peritoneal nodularity. MRI pelvis NOV 2018- NED.   # Jan 23rd 2019- Gynecological exam with Dr. Theora Gianotti- resolution of the palpable metastatic disease. Will plan CT scan in 2 months; unless ca-125 rising significnatly.   # Currently on Avastin maintenance.  No concerns for clinical progression; tolearting well.   # Continue maintenance  avastin- tolerating well; improved.  # left hip pain- on PT improving;  # Elevated Blood pressure-improved;   # follow up in feb 18th/monday- Margarette Asal UA/labs- ca-125. Hold CT scan for now.    Orders Placed This Encounter  Procedures  . CBC with Differential    Standing Status:   Future    Standing Expiration Date:   09/11/2018  . Comprehensive metabolic panel    Standing Status:   Future    Standing Expiration Date:   09/11/2018  . CA 125    Standing Status:   Future    Standing Expiration Date:   09/11/2018     Cammie Sickle, MD 09/17/2017 6:46 PM

## 2017-09-11 NOTE — Assessment & Plan Note (Addendum)
Recurrent  Platinum refractory ovarian cancer [dec 2017] August 10 CT scan- improvement/resolution of the pelvic peritoneal nodularity. MRI pelvis NOV 2018- NED.   # Jan 23rd 2019- Gynecological exam with Dr. Theora Gianotti- resolution of the palpable metastatic disease. Will plan CT scan in 2 months; unless ca-125 rising significnatly.   # Currently on Avastin maintenance.  No concerns for clinical progression; tolearting well.   # Continue maintenance avastin- tolerating well; improved.  # left hip pain- on PT improving;  # Elevated Blood pressure-improved;   # follow up in feb 18th/monday- Margarette Asal UA/labs- ca-125. Hold CT scan for now.

## 2017-09-11 NOTE — Progress Notes (Signed)
A few days after avastin she burns when she urinates. Then it goes away

## 2017-09-11 NOTE — Progress Notes (Signed)
Chaperoned pelvic exam. Oncology Nurse Navigator Documentation  Navigator Location: [facility] (09/11/17 0900)   )Navigator Encounter Type: Follow-up Appt (09/11/17 0900)                     Patient Visit Type: GynOnc (09/11/17 0900)                              Time Spent with Patient: 15 (09/11/17 0900)

## 2017-09-12 LAB — CA 125: CANCER ANTIGEN (CA) 125: 22.4 U/mL (ref 0.0–38.1)

## 2017-09-13 ENCOUNTER — Ambulatory Visit: Payer: PPO

## 2017-09-13 DIAGNOSIS — M25552 Pain in left hip: Secondary | ICD-10-CM

## 2017-09-13 DIAGNOSIS — R2689 Other abnormalities of gait and mobility: Secondary | ICD-10-CM

## 2017-09-13 DIAGNOSIS — M6281 Muscle weakness (generalized): Secondary | ICD-10-CM

## 2017-09-13 NOTE — Therapy (Signed)
[facility] [address], [place], [address] Phone: [phone]   Fax:  [phone]  Physical Therapy Treatment  Patient Details  Name: Jamie Conner MRN: [id number] Date of Birth: 1941-06-23 Referring Provider: Eliezer Bottom   Encounter Date: 09/13/2017  PT End of Session - 09/13/17 1009    Visit Number  4    Number of Visits  12    Date for PT Re-Evaluation  09/23/17    Authorization Type  [employer]     Authorization Time Period  09/02/17-10/14/17    Authorization - Visit Number  4    Authorization - Number of Visits  10    PT Start Time  1005    PT Stop Time  1045    PT Time Calculation (min)  40 min    Activity Tolerance  Patient tolerated treatment well    Behavior During Therapy  [facility] for tasks assessed/performed       Past Medical History:  Diagnosis Date  . Atrial fibrillation ([place])   . Breast cancer ([place]) 1998  . Cancer of breast ([place]) 1998   Left/radiation  . CINV (chemotherapy-induced nausea and vomiting)   . GERD (gastroesophageal reflux disease)   . Hyperlipidemia   . Hypothyroidism   . Mitral insufficiency   . Ovarian cancer ([place])    s/p BSO optimal tumor debulking May 2016/chemo  . Personal history of chemotherapy since 2016   ovarian cancer  . Personal history of radiation therapy 1998  . PVC (premature ventricular contraction)     Past Surgical History:  Procedure Laterality Date  . ABDOMINAL HYSTERECTOMY    . BILATERAL SALPINGOOPHORECTOMY  May 2016   with optimal tumor debulking   . BREAST LUMPECTOMY Left 1998  . BREAST SURGERY     Left  . CESAREAN SECTION     x2  . CHOLECYSTECTOMY    . COLONOSCOPY  2006  . DEBULKING N/A 12/22/2014   Procedure: DEBULKING;  Surgeon: Gillis Ends, MD;  Location: [facility];  Service: Gynecology;  Laterality: N/A;  . LAPAROTOMY N/A 12/22/2014   Procedure: EXPLORATORY LAPAROTOMY;  Surgeon: Robert Bellow, MD;   Location: [facility];  Service: General;  Laterality: N/A;  . LAPAROTOMY WITH STAGING N/A 12/22/2014   Procedure: LAPAROTOMY WITH STAGING;  Surgeon: Gillis Ends, MD;  Location: [facility];  Service: Gynecology;  Laterality: N/A;  . PERIPHERAL VASCULAR CATHETERIZATION Left 12/22/2014   Procedure: PORTA CATH INSERTION;  Surgeon: Robert Bellow, MD;  Location: [facility];  Service: General;  Laterality: Left;  . PORTACATH PLACEMENT Right 12/22/2014   Procedure: INSERTION PORT-A-CATH;  Surgeon: Robert Bellow, MD;  Location: [facility];  Service: General;  Laterality: Right;  . SALPINGOOPHORECTOMY Bilateral 12/22/2014   Procedure: SALPINGO OOPHORECTOMY;  Surgeon: Gillis Ends, MD;  Location: [facility];  Service: Gynecology;  Laterality: Bilateral;  . UPPER GI ENDOSCOPY  09/25/04   hiatus hernia, schatzki ring and a single gastric polyp    There were no vitals filed for this visit.  Subjective Assessment - 09/13/17 1008    Subjective  Pt states that she is doing well at this time. She reports that her pain continues to decrease and her gait is improving. She complains of 2/10 L hip pain upon arrival.     Pertinent History  Pt reports she has been having increased stiffness and achy pain in the left anterior hip since September 2018. She reports there used to  be intermittent pain in the morning, but now most morning feel pretty good. She reports increased symptoms with increased activity on feet. She self-limited trips into the community to 1 at a time and keeps them short. No prior history of Left hip injury or surgery, and no prior incidence of these symptoms. Pt reports old Rt knee issue.  Pt does reports some subjective dynamic instability. Pt denies falls history. She says her balance is 'pretty good' but she has to 'watch what she's doing and where she's stepping'.     Limitations  Walking;Other (comment)    How long can you sit comfortably?  Increaed stiffness after sitting 1 hour  (reports this is getting better)    How long can you stand comfortably?  No limited activity    How long can you walk comfortably?  15 minutes (self selected gait for IADL)     Diagnostic tests  Xray/MRI     Patient Stated Goals  Make symptoms go away     Currently in Pain?  Yes    Pain Score  2     Pain Location  Hip    Pain Orientation  Medial    Pain Descriptors / Indicators  Aching    Pain Type  Chronic pain    Pain Onset  1 to 4 weeks ago    Pain Frequency  Intermittent         TREATMENT  Ther-ex  NuStep L2-3, seat position 7 for warm-up during hx intake (3 minutes unbilled); Hooklying bridges 2 x 10; Hooklying clams with yellow tband 2 x 10; Pt showed how to isolate L side if needed with R sidelying clams, yellow tband, perform x 10; Figure 4 stretch 3 x 45s hold w/ PT overpressure L single knee to chest stretch 3 x 45s hold w/ PT overpressure; Thomas stretch 2 x 45sec hold performed by PT Sidelying abd 2 x 10 with cues and assist for proper form/technique  Manual Therapy  L hip long axis distraction, grade III oscillations, 30s/bout x 3 bouts;                     PT Education - 09/13/17 1009    Education provided  Yes    Education Details  Stretching technique    Person(s) Educated  Patient    Methods  Explanation;Demonstration    Comprehension  Verbalized understanding          PT Long Term Goals - 09/02/17 1605      PT LONG TERM GOAL #1   Title  After 6 weeks patient will demonstrate 5/5 strength in hip flexion bilat and ER/IR of hips seated bilat, without exacerbation of pain, 4/5 or greater bilat glute med abduction supine.     Time  6    Period  Weeks    Status  New    Target Date  10/14/17      PT LONG TERM GOAL #2   Title  After 6 weeks patient wil demonstrate improved self selected gait speed as evidenced 10MWT >1.79m/s.    Time  6    Period  Weeks    Status  New    Target Date  10/14/17      PT LONG TERM GOAL #3   Title   After 6 weeks patient will demonstrate improve funcitonal hiop strength AEB 5xSTS hands free in <18seconds.     Time  6    Period  Weeks    Status  New    Target Date  10/14/17      PT LONG TERM GOAL #4   Title  After 6 weeks patient will demonstrate corrct form and report good compliance wiht advanced HEP for DC to continue progress in hip strengthing fro next 3-6 months.     Time  6    Period  Weeks    Status  New    Target Date  10/14/17            Plan - 09/13/17 1010    Clinical Impression Statement  Pt reporting >50% improvement in her symptoms since starting with therapy. She is performing HEP consistently and demonstrates good technique/recall. No HEP progression provided on this date. She reports symptoms relief from long axis L hip distraction. Pt will benefit from PT services to address deficits in strength, balance, and mobility in order to return to full function at home.     Rehab Potential  Fair    Clinical Impairments Affecting Rehab Potential  chronicicty of deficits in strength     PT Frequency  2x / week    PT Duration  6 weeks    PT Treatment/Interventions  ADLs/Self Care Home Management;Balance training;Gait training;Stair training;Cryotherapy;Iontophoresis 4mg /ml Dexamethasone;Moist Heat;Therapeutic exercise;Therapeutic activities;Functional mobility training    PT Next Visit Plan  Continue with functional glute strengthening    PT Home Exercise Plan  Supine bridging, frog stretch 30sec holds, standing hip flexion stretch 30sec, clamshells       Patient will benefit from skilled therapeutic intervention in order to improve the following deficits and impairments:  Abnormal gait, Obesity, Decreased activity tolerance, Pain, Increased fascial restricitons, Decreased balance, Difficulty walking, Increased muscle spasms, Improper body mechanics  Visit Diagnosis: Pain in left hip  Muscle weakness (generalized)  Other abnormalities of gait and  mobility     Problem List Patient Active Problem List   Diagnosis Date Noted  . Ovarian cancer, unspecified laterality ([place]) 09/11/2017  . Counseling regarding goals of care 11/26/2016  . Encounter for monitoring cardiotoxic drug therapy 02/02/2016  . Breast cancer in female [facility]) 12/13/2015  . Peptic ulcer disease 12/12/2015  . DDD (degenerative disc disease), lumbosacral 12/12/2015  . Hyperlipidemia 12/12/2015  . Acute anxiety 12/12/2015  . Insomnia 12/12/2015  . Allergic rhinitis 12/12/2015  . GERD (gastroesophageal reflux disease) 12/12/2015  . Internal hemorrhoid 12/12/2015  . OA (osteoarthritis) 12/12/2015  . Osteopenia 12/12/2015  . Hypothyroid 04/11/2015  . Benign essential HTN 02/01/2015  . Retroperitoneal fibrosis   . Combined fat and carbohydrate induced hyperlipemia 01/04/2015  . Awareness of heartbeats 01/04/2015  . Beat, premature ventricular 01/04/2015  . Malignant neoplasm of ovary ([place]) 12/16/2014  . MI (mitral incompetence) 09/02/2014  . AF (paroxysmal atrial fibrillation) ([place]) 09/02/2014   Phillips Grout PT, DPT   Demarco Bacci 09/13/2017, 10:45 AM  [facility] [address], [place], [address] Phone: [phone]   Fax:  [phone]  Name: SHEELA MCCULLEY MRN: [id number] Date of Birth: 11/12/1940

## 2017-09-17 ENCOUNTER — Other Ambulatory Visit: Payer: Self-pay | Admitting: *Deleted

## 2017-09-17 ENCOUNTER — Encounter: Payer: Self-pay | Admitting: Physical Therapy

## 2017-09-17 ENCOUNTER — Ambulatory Visit: Payer: PPO | Admitting: Physical Therapy

## 2017-09-17 ENCOUNTER — Encounter: Payer: Self-pay | Admitting: *Deleted

## 2017-09-17 DIAGNOSIS — M25552 Pain in left hip: Secondary | ICD-10-CM

## 2017-09-17 NOTE — Patient Outreach (Signed)
[facility] High Risk outreach attempt #3 unsuccessful. I will send Mrs. Dickison our [facility] information for her future reference.  Jamie Conner. Myrtie Neither, MSN, [facility] Gerontological Nurse Practitioner [facility] Care Management [phone]

## 2017-09-17 NOTE — Therapy (Signed)
[facility] [address], [place], [address] Phone: [phone]   Fax:  [phone]  Physical Therapy Treatment  Patient Details  Name: Jamie Conner MRN: [id number] Date of Birth: 07/29/1941 Referring Provider: Eliezer Bottom   Encounter Date: 09/17/2017    Past Medical History:  Diagnosis Date  . Atrial fibrillation ([place])   . Breast cancer ([place]) 1998  . Cancer of breast ([place]) 1998   Left/radiation  . CINV (chemotherapy-induced nausea and vomiting)   . GERD (gastroesophageal reflux disease)   . Hyperlipidemia   . Hypothyroidism   . Mitral insufficiency   . Ovarian cancer ([place])    s/p BSO optimal tumor debulking May 2016/chemo  . Personal history of chemotherapy since 2016   ovarian cancer  . Personal history of radiation therapy 1998  . PVC (premature ventricular contraction)     Past Surgical History:  Procedure Laterality Date  . ABDOMINAL HYSTERECTOMY    . BILATERAL SALPINGOOPHORECTOMY  May 2016   with optimal tumor debulking   . BREAST LUMPECTOMY Left 1998  . BREAST SURGERY     Left  . CESAREAN SECTION     x2  . CHOLECYSTECTOMY    . COLONOSCOPY  2006  . DEBULKING N/A 12/22/2014   Procedure: DEBULKING;  Surgeon: Gillis Ends, MD;  Location: [facility];  Service: Gynecology;  Laterality: N/A;  . LAPAROTOMY N/A 12/22/2014   Procedure: EXPLORATORY LAPAROTOMY;  Surgeon: Robert Bellow, MD;  Location: [facility];  Service: General;  Laterality: N/A;  . LAPAROTOMY WITH STAGING N/A 12/22/2014   Procedure: LAPAROTOMY WITH STAGING;  Surgeon: Gillis Ends, MD;  Location: [facility];  Service: Gynecology;  Laterality: N/A;  . PERIPHERAL VASCULAR CATHETERIZATION Left 12/22/2014   Procedure: PORTA CATH INSERTION;  Surgeon: Robert Bellow, MD;  Location: [facility];  Service: General;  Laterality: Left;  . PORTACATH PLACEMENT Right 12/22/2014   Procedure: INSERTION PORT-A-CATH;   Surgeon: Robert Bellow, MD;  Location: [facility];  Service: General;  Laterality: Right;  . SALPINGOOPHORECTOMY Bilateral 12/22/2014   Procedure: SALPINGO OOPHORECTOMY;  Surgeon: Gillis Ends, MD;  Location: [facility];  Service: Gynecology;  Laterality: Bilateral;  . UPPER GI ENDOSCOPY  09/25/04   hiatus hernia, schatzki ring and a single gastric polyp    There were no vitals filed for this visit.      Manual -Long axis hip distraction Grade3 30sec bouts 6 bouts -Figure 4 stretch with PT overpressure 3x 45sec hold -Elys stretch (prone) 4x 45sec hold  Ther-Ex -Nustep for 4 min during hx intake and muscular soreness education -Standing hip abd bilat 3x 10 w/ VC for glute contraction -Standing hip abd bilat 3x 10 w/ VC for glute contraction *Seated rest break required between sets*  -Lateral step over 12in step 2x 10 (10 each) with PT contact guard for safety                            PT Long Term Goals - 09/02/17 1605      PT LONG TERM GOAL #1   Title  After 6 weeks patient will demonstrate 5/5 strength in hip flexion bilat and ER/IR of hips seated bilat, without exacerbation of pain, 4/5 or greater bilat glute med abduction supine.     Time  6    Period  Weeks    Status  New    Target Date  10/14/17  PT LONG TERM GOAL #2   Title  After 6 weeks patient wil demonstrate improved self selected gait speed as evidenced 10MWT >1.59m/s.    Time  6    Period  Weeks    Status  New    Target Date  10/14/17      PT LONG TERM GOAL #3   Title  After 6 weeks patient will demonstrate improve funcitonal hiop strength AEB 5xSTS hands free in <18seconds.     Time  6    Period  Weeks    Status  New    Target Date  10/14/17      PT LONG TERM GOAL #4   Title  After 6 weeks patient will demonstrate corrct form and report good compliance wiht advanced HEP for DC to continue progress in hip strengthing fro next 3-6 months.     Time  6    Period  Weeks     Status  New    Target Date  10/14/17              Patient will benefit from skilled therapeutic intervention in order to improve the following deficits and impairments:     Visit Diagnosis: No diagnosis found.     Problem List Patient Active Problem List   Diagnosis Date Noted  . Ovarian cancer, unspecified laterality ([place]) 09/11/2017  . Counseling regarding goals of care 11/26/2016  . Encounter for monitoring cardiotoxic drug therapy 02/02/2016  . Breast cancer in female [facility]) 12/13/2015  . Peptic ulcer disease 12/12/2015  . DDD (degenerative disc disease), lumbosacral 12/12/2015  . Hyperlipidemia 12/12/2015  . Acute anxiety 12/12/2015  . Insomnia 12/12/2015  . Allergic rhinitis 12/12/2015  . GERD (gastroesophageal reflux disease) 12/12/2015  . Internal hemorrhoid 12/12/2015  . OA (osteoarthritis) 12/12/2015  . Osteopenia 12/12/2015  . Hypothyroid 04/11/2015  . Benign essential HTN 02/01/2015  . Retroperitoneal fibrosis   . Combined fat and carbohydrate induced hyperlipemia 01/04/2015  . Awareness of heartbeats 01/04/2015  . Beat, premature ventricular 01/04/2015  . Malignant neoplasm of ovary ([place]) 12/16/2014  . MI (mitral incompetence) 09/02/2014  . AF (paroxysmal atrial fibrillation) ([place]) 09/02/2014    Shelton Silvas PT, DPT  Shelton Silvas 09/17/2017, 3:19 PM  [facility] [address], [place], [address] Phone: [phone]   Fax:  [phone]  Name: DAVAN HARK MRN: [id number] Date of Birth: 05-Oct-1940

## 2017-09-20 ENCOUNTER — Ambulatory Visit: Payer: PPO | Attending: Internal Medicine | Admitting: Physical Therapy

## 2017-09-20 ENCOUNTER — Encounter: Payer: Self-pay | Admitting: Physical Therapy

## 2017-09-20 DIAGNOSIS — M25552 Pain in left hip: Secondary | ICD-10-CM | POA: Diagnosis not present

## 2017-09-20 NOTE — Therapy (Signed)
[facility] [address] [place], [address] Phone: [phone]   Fax:  [phone]  Physical Therapy Treatment  Patient Details  Name: Jamie Conner MRN: [id number] Date of Birth: [birth year] Referring Provider: Eliezer Bottom   Encounter Date: 09/20/2017  PT End of Session - 09/20/17 1028    Visit Number  5    Number of Visits  12    Date for PT Re-Evaluation  09/23/17    Authorization Type  [employer]     Authorization Time Period  09/02/17-10/14/17    PT Start Time  1000    PT Stop Time  1041    PT Time Calculation (min)  41 min       Past Medical History:  Diagnosis Date  . Atrial fibrillation ([place])   . Breast cancer ([place]) 1998  . Cancer of breast ([place]) 1998   Left/radiation  . CINV (chemotherapy-induced nausea and vomiting)   . GERD (gastroesophageal reflux disease)   . Hyperlipidemia   . Hypothyroidism   . Mitral insufficiency   . Ovarian cancer ([place])    s/p BSO optimal tumor debulking May 2016/chemo  . Personal history of chemotherapy since 2016   ovarian cancer  . Personal history of radiation therapy 1998  . PVC (premature ventricular contraction)     Past Surgical History:  Procedure Laterality Date  . ABDOMINAL HYSTERECTOMY    . BILATERAL SALPINGOOPHORECTOMY  May 2016   with optimal tumor debulking   . BREAST LUMPECTOMY Left 1998  . BREAST SURGERY     Left  . CESAREAN SECTION     x2  . CHOLECYSTECTOMY    . COLONOSCOPY  2006  . DEBULKING N/A 12/22/2014   Procedure: DEBULKING;  Surgeon: Gillis Ends, MD;  Location: [facility];  Service: Gynecology;  Laterality: N/A;  . LAPAROTOMY N/A 12/22/2014   Procedure: EXPLORATORY LAPAROTOMY;  Surgeon: Robert Bellow, MD;  Location: [facility];  Service: General;  Laterality: N/A;  . LAPAROTOMY WITH STAGING N/A 12/22/2014   Procedure: LAPAROTOMY WITH STAGING;  Surgeon: Gillis Ends, MD;  Location: [facility];   Service: Gynecology;  Laterality: N/A;  . PERIPHERAL VASCULAR CATHETERIZATION Left 12/22/2014   Procedure: PORTA CATH INSERTION;  Surgeon: Robert Bellow, MD;  Location: [facility];  Service: General;  Laterality: Left;  . PORTACATH PLACEMENT Right 12/22/2014   Procedure: INSERTION PORT-A-CATH;  Surgeon: Robert Bellow, MD;  Location: [facility];  Service: General;  Laterality: Right;  . SALPINGOOPHORECTOMY Bilateral 12/22/2014   Procedure: SALPINGO OOPHORECTOMY;  Surgeon: Gillis Ends, MD;  Location: [facility];  Service: Gynecology;  Laterality: Bilateral;  . UPPER GI ENDOSCOPY  09/25/04   hiatus hernia, schatzki ring and a single gastric polyp    There were no vitals filed for this visit.  Subjective Assessment - 09/20/17 1026    Subjective  Pt reports no pain today. Patient reports she has been diligent with her HEP and feels as though she will be able to maintain PT progress with a 1x/week frequency.     Pertinent History  Pt reports she has been having increased stiffness and achy pain in the left anterior hip since September 2018. She reports there used to be intermittent pain in the morning, but now most morning feel pretty good. She reports increased symptoms with increased activity on feet. She self-limited trips into the community to 1 at a time and keeps them short. No prior history of Left  hip injury or surgery, and no prior incidence of these symptoms. Pt reports old Rt knee issue.  Pt does reports some subjective dynamic instability. Pt denies falls history. She says her balance is 'pretty good' but she has to 'watch what she's doing and where she's stepping'.     Limitations  Walking;Other (comment)    How long can you sit comfortably?  Increaed stiffness after sitting 1 hour (reports this is getting better)    How long can you stand comfortably?  No limited activity    How long can you walk comfortably?  15 minutes (self selected gait for IADL)     Diagnostic tests  Xray/MRI      Patient Stated Goals  Make symptoms go away     Currently in Pain?  No/denies    Pain Score  0-No pain    Pain Onset  1 to 4 weeks ago         Ther-Ex -Nustep 16min during hx  Intake -Figure 4 stretch w/ PT over pressure 3x 30sec -Hamstring stretch 3x 30sec -3x 10 sidelying hip abd 50% ROM  -3x 10 clamshell w/ yellow tband with VC for pelvic alignment (added to HEP) -Mini Squat from lifted mat table 4x 10 lowering mat table each round (added to HEP) required prolonged rest breaks between -Patient attempted stair negotiation noted that she was able to ambulate stairs with reciprocal gait and she was not able before, noting she feels stronger since beginning PT                        PT Education - 09/20/17 1028    Education provided  Yes    Education Details  HEP progression and possibly tapering PT frequency in the following week    Person(s) Educated  Patient    Methods  Explanation    Comprehension  Verbalized understanding          PT Long Term Goals - 09/02/17 1605      PT LONG TERM GOAL #1   Title  After 6 weeks patient will demonstrate 5/5 strength in hip flexion bilat and ER/IR of hips seated bilat, without exacerbation of pain, 4/5 or greater bilat glute med abduction supine.     Time  6    Period  Weeks    Status  New    Target Date  10/14/17      PT LONG TERM GOAL #2   Title  After 6 weeks patient wil demonstrate improved self selected gait speed as evidenced 10MWT >1.28m/s.    Time  6    Period  Weeks    Status  New    Target Date  10/14/17      PT LONG TERM GOAL #3   Title  After 6 weeks patient will demonstrate improve funcitonal hiop strength AEB 5xSTS hands free in <18seconds.     Time  6    Period  Weeks    Status  New    Target Date  10/14/17      PT LONG TERM GOAL #4   Title  After 6 weeks patient will demonstrate corrct form and report good compliance wiht advanced HEP for DC to continue progress in hip strengthing fro  next 3-6 months.     Time  6    Period  Weeks    Status  New    Target Date  10/14/17  Plan - 09/20/17 1049    Clinical Impression Statement  Pt is continuing to make progress with mobility restrictions, strength gains, and is having less pain. Patient did not require manual techniques today, as she did not have soft tissue restriction and PT was able to progress therex. Pt reported she thought she could taper down PT appt frequency, so PT took time reviewing HEP and added to strengthening exercises to HEP. PT will continue to progress HEP next visit as indicated by patient tolerance with current HEP and reassess possibility of decreasing frequency with robust HEP program.     Clinical Impairments Affecting Rehab Potential  chronicicty of deficits in strength     PT Frequency  2x / week    PT Treatment/Interventions  ADLs/Self Care Home Management;Balance training;Gait training;Stair training;Cryotherapy;Iontophoresis 4mg /ml Dexamethasone;Moist Heat;Therapeutic exercise;Therapeutic activities;Functional mobility training    PT Next Visit Plan  Continue with functional glute strengthening    PT Home Exercise Plan  Supine bridging, frog stretch 30sec holds, standing hip flexion stretch 30sec, clamshells    Consulted and Agree with Plan of Care  Patient       Patient will benefit from skilled therapeutic intervention in order to improve the following deficits and impairments:  Abnormal gait, Obesity, Decreased activity tolerance, Pain, Increased fascial restricitons, Decreased balance, Difficulty walking, Increased muscle spasms, Improper body mechanics  Visit Diagnosis: Pain in left hip     Problem List Patient Active Problem List   Diagnosis Date Noted  . Ovarian cancer, unspecified laterality ([place]) 09/11/2017  . Counseling regarding goals of care 11/26/2016  . Encounter for monitoring cardiotoxic drug therapy 02/02/2016  . Breast cancer in female [facility]) 12/13/2015  .  Peptic ulcer disease 12/12/2015  . DDD (degenerative disc disease), lumbosacral 12/12/2015  . Hyperlipidemia 12/12/2015  . Acute anxiety 12/12/2015  . Insomnia 12/12/2015  . Allergic rhinitis 12/12/2015  . GERD (gastroesophageal reflux disease) 12/12/2015  . Internal hemorrhoid 12/12/2015  . OA (osteoarthritis) 12/12/2015  . Osteopenia 12/12/2015  . Hypothyroid 04/11/2015  . Benign essential HTN 02/01/2015  . Retroperitoneal fibrosis   . Combined fat and carbohydrate induced hyperlipemia 01/04/2015  . Awareness of heartbeats 01/04/2015  . Beat, premature ventricular 01/04/2015  . Malignant neoplasm of ovary ([place]) 12/16/2014  . MI (mitral incompetence) 09/02/2014  . AF (paroxysmal atrial fibrillation) ([place]) 09/02/2014   Shelton Silvas PT, DPT Shelton Silvas 09/20/2017, 10:55 AM  [facility] [address] [place], [address] Phone: [phone]   Fax:  [phone]  Name: Jamie Conner MRN: [id number] Date of Birth: 1941/08/07

## 2017-09-24 ENCOUNTER — Ambulatory Visit: Payer: PPO | Admitting: Physical Therapy

## 2017-09-26 ENCOUNTER — Ambulatory Visit: Payer: PPO | Admitting: Physical Therapy

## 2017-09-27 ENCOUNTER — Telehealth: Payer: Self-pay | Admitting: *Deleted

## 2017-09-27 NOTE — Telephone Encounter (Signed)
Patient informed after discussing with Dr Rogue Bussing that she can take another dose of Lisinopril. She repeated this back to me

## 2017-09-27 NOTE — Telephone Encounter (Signed)
Patient called to report that she has had a headache for 3 days. She thinks it is a side effect of her Avastin treatment and states she takes Lisinopril and her b/p is 144/80 She asks if she should take another Lisinopril.Please advise

## 2017-09-30 ENCOUNTER — Encounter: Payer: PPO | Admitting: Physical Therapy

## 2017-10-01 ENCOUNTER — Ambulatory Visit: Payer: PPO | Admitting: Physical Therapy

## 2017-10-03 ENCOUNTER — Encounter: Payer: PPO | Admitting: Physical Therapy

## 2017-10-04 ENCOUNTER — Ambulatory Visit: Payer: PPO | Admitting: Physical Therapy

## 2017-10-07 ENCOUNTER — Inpatient Hospital Stay: Payer: PPO

## 2017-10-07 ENCOUNTER — Inpatient Hospital Stay: Payer: PPO | Admitting: Internal Medicine

## 2017-10-07 ENCOUNTER — Encounter: Payer: PPO | Admitting: Physical Therapy

## 2017-10-07 ENCOUNTER — Inpatient Hospital Stay: Payer: PPO | Attending: Internal Medicine

## 2017-10-07 VITALS — BP 124/79 | HR 86 | Temp 97.9°F | Resp 16 | Wt 190.5 lb

## 2017-10-07 DIAGNOSIS — G4700 Insomnia, unspecified: Secondary | ICD-10-CM

## 2017-10-07 DIAGNOSIS — C569 Malignant neoplasm of unspecified ovary: Secondary | ICD-10-CM

## 2017-10-07 DIAGNOSIS — C786 Secondary malignant neoplasm of retroperitoneum and peritoneum: Secondary | ICD-10-CM | POA: Insufficient documentation

## 2017-10-07 DIAGNOSIS — F419 Anxiety disorder, unspecified: Secondary | ICD-10-CM | POA: Diagnosis not present

## 2017-10-07 DIAGNOSIS — Z5112 Encounter for antineoplastic immunotherapy: Secondary | ICD-10-CM | POA: Insufficient documentation

## 2017-10-07 DIAGNOSIS — R030 Elevated blood-pressure reading, without diagnosis of hypertension: Secondary | ICD-10-CM | POA: Insufficient documentation

## 2017-10-07 DIAGNOSIS — R51 Headache: Secondary | ICD-10-CM

## 2017-10-07 DIAGNOSIS — R519 Headache, unspecified: Secondary | ICD-10-CM

## 2017-10-07 LAB — CBC WITH DIFFERENTIAL/PLATELET
BASOS ABS: 0 10*3/uL (ref 0–0.1)
Basophils Relative: 1 %
EOS ABS: 0.2 10*3/uL (ref 0–0.7)
EOS PCT: 3 %
HCT: 41.8 % (ref 35.0–47.0)
Hemoglobin: 14.1 g/dL (ref 12.0–16.0)
LYMPHS PCT: 42 %
Lymphs Abs: 2.7 10*3/uL (ref 1.0–3.6)
MCH: 32.1 pg (ref 26.0–34.0)
MCHC: 33.8 g/dL (ref 32.0–36.0)
MCV: 95.2 fL (ref 80.0–100.0)
Monocytes Absolute: 0.7 10*3/uL (ref 0.2–0.9)
Monocytes Relative: 12 %
Neutro Abs: 2.6 10*3/uL (ref 1.4–6.5)
Neutrophils Relative %: 42 %
PLATELETS: 262 10*3/uL (ref 150–440)
RBC: 4.39 MIL/uL (ref 3.80–5.20)
RDW: 13.8 % (ref 11.5–14.5)
WBC: 6.2 10*3/uL (ref 3.6–11.0)

## 2017-10-07 LAB — URINALYSIS, COMPLETE (UACMP) WITH MICROSCOPIC
BILIRUBIN URINE: NEGATIVE
Bacteria, UA: NONE SEEN
GLUCOSE, UA: NEGATIVE mg/dL
Hgb urine dipstick: NEGATIVE
Ketones, ur: NEGATIVE mg/dL
LEUKOCYTES UA: NEGATIVE
Nitrite: NEGATIVE
PH: 5 (ref 5.0–8.0)
Protein, ur: NEGATIVE mg/dL
SPECIFIC GRAVITY, URINE: 1.009 (ref 1.005–1.030)
SQUAMOUS EPITHELIAL / LPF: NONE SEEN

## 2017-10-07 LAB — COMPREHENSIVE METABOLIC PANEL
ALBUMIN: 3.5 g/dL (ref 3.5–5.0)
ALK PHOS: 63 U/L (ref 38–126)
ALT: 13 U/L — AB (ref 14–54)
AST: 21 U/L (ref 15–41)
Anion gap: 7 (ref 5–15)
BILIRUBIN TOTAL: 0.6 mg/dL (ref 0.3–1.2)
BUN: 19 mg/dL (ref 6–20)
CALCIUM: 9.3 mg/dL (ref 8.9–10.3)
CO2: 24 mmol/L (ref 22–32)
CREATININE: 1.03 mg/dL — AB (ref 0.44–1.00)
Chloride: 107 mmol/L (ref 101–111)
GFR calc Af Amer: >60 mL/min (ref >60)
GFR, EST NON AFRICAN AMERICAN: 52 mL/min — AB (ref >60)
GLUCOSE: 100 mg/dL — AB (ref 65–99)
POTASSIUM: 4.2 mmol/L (ref 3.5–5.1)
Sodium: 138 mmol/L (ref 135–145)
TOTAL PROTEIN: 6.8 g/dL (ref 6.5–8.1)

## 2017-10-07 MED ORDER — HEPARIN SOD (PORK) LOCK FLUSH 100 UNIT/ML IV SOLN
500.0000 [IU] | Freq: Once | INTRAVENOUS | Status: AC | PRN
  Administered 2017-10-07: 500 [IU]
  Filled 2017-10-07: qty 5

## 2017-10-07 MED ORDER — LORAZEPAM 0.5 MG PO TABS: 0.2500 mg | ORAL_TABLET | Freq: Every day | ORAL | 4 refills | Status: DC

## 2017-10-07 MED ORDER — SODIUM CHLORIDE 0.9 % IV SOLN
15.0000 mg/kg | Freq: Once | INTRAVENOUS | Status: AC
  Administered 2017-10-07: 1300 mg via INTRAVENOUS
  Filled 2017-10-07: qty 48

## 2017-10-07 MED ORDER — SODIUM CHLORIDE 0.9 % IV SOLN
Freq: Once | INTRAVENOUS | Status: AC
  Administered 2017-10-07: 11:00:00 via INTRAVENOUS
  Filled 2017-10-07: qty 1000

## 2017-10-07 MED ORDER — SODIUM CHLORIDE 0.9% FLUSH
10.0000 mL | INTRAVENOUS | Status: DC | PRN
Start: 2017-10-07 — End: 2017-10-07
  Filled 2017-10-07: qty 10

## 2017-10-07 MED ORDER — LISINOPRIL 10 MG PO TABS: 10.0000 mg | ORAL_TABLET | Freq: Two times a day (BID) | ORAL | 4 refills | Status: DC

## 2017-10-07 NOTE — Progress Notes (Signed)
[place] OFFICE PROGRESS NOTE  Patient Care Team: Jerrol Banana., MD as PCP - General (Family Medicine) Gillis Ends, MD as Referring Physician (Obstetrics and Gynecology) Bary Castilla Forest Gleason, MD as Consulting Physician (General Surgery)  Ovarian cancer [facility])   Staging form: Ovary, AJCC 7th Edition     Clinical: Stage IIIC (T3c, N0, M0) - Unsigned   Oncology History   # April- MAY 2016- OVARIAN CANCER STAGE IIIC; CA-125 +2300+;  s/p OPTIMAL DEBULKING SURGERY   # MAY 2016[name] [finished in Sep 19th 2017]  # MAY CT 2017- RECURRENT/Peritoneal carcinomatosis/pelvic implant ~1.2cm/Ca-125-34;   # July 3rd- CARBO-DOXIL q 4W [with neulasta]; CT May 11 2016- slight increase peritoneal / stable nodules. Cont chemo [finished DEC 2017]. DEC 28th 2017 CT- Increase in peritoneal deposits; increasing Ca 125 [60s];  # Jan 2018- carbo-Taxol-Avastin x6 cycles; Aug 2018- CR; Aug 2018- start Avastin Maintenance  # G-1-2 hand foot syndrome-   # LEFT BREAST CA s/p Lumpec & RT s/p TAM   # Afib [cardiology]; JUNE 2017- MUGA 62%  MOLECULAR TESTING- ATM genetic mutation; TUMOR BRCA- NEG; Myriad genomic instability- NEGATIVE     Malignant neoplasm of ovary (HCC)    INTERVAL HISTORY:  77 year old female patient with above history of ovarian cancer recurrent Platinum Refractory ovarian cancer [dec 2017]. Currently on maintenance Avastin is here for follow-up.  Patient complains of continued nasal congestion/sinus drainage.  She has intermittent headaches which she attributes to Avastin.  She has not been taking the lisinopril.   No abdominal pain.  No constipation.  No swelling in the legs.  Has intermittent episodes of anxiety for which she is taking anxiolytic.  REVIEW OF SYSTEMS:  A complete 10 point review of system is done which is negative except mentioned above/history of present illness.   PAST MEDICAL HISTORY :  Past Medical History:   Diagnosis Date  . Atrial fibrillation ([place])   . Breast cancer ([place]) 1998  . Cancer of breast ([place]) 1998   Left/radiation  . CINV (chemotherapy-induced nausea and vomiting)   . GERD (gastroesophageal reflux disease)   . Hyperlipidemia   . Hypothyroidism   . Mitral insufficiency   . Ovarian cancer ([place])    s/p BSO optimal tumor debulking May 2016/chemo  . Personal history of chemotherapy since 2016   ovarian cancer  . Personal history of radiation therapy 1998  . PVC (premature ventricular contraction)     PAST SURGICAL HISTORY :   Past Surgical History:  Procedure Laterality Date  . ABDOMINAL HYSTERECTOMY    . BILATERAL SALPINGOOPHORECTOMY  May 2016   with optimal tumor debulking   . BREAST LUMPECTOMY Left 1998  . BREAST SURGERY     Left  . CESAREAN SECTION     x2  . CHOLECYSTECTOMY    . COLONOSCOPY  2006  . DEBULKING N/A 12/22/2014   Procedure: DEBULKING;  Surgeon: Gillis Ends, MD;  Location: [facility] ORS;  Service: Gynecology;  Laterality: N/A;  . LAPAROTOMY N/A 12/22/2014   Procedure: EXPLORATORY LAPAROTOMY;  Surgeon: Robert Bellow, MD;  Location: [facility] ORS;  Service: General;  Laterality: N/A;  . LAPAROTOMY WITH STAGING N/A 12/22/2014   Procedure: LAPAROTOMY WITH STAGING;  Surgeon: Gillis Ends, MD;  Location: [facility] OR[facility];  Service: Gynecology;  Laterality: N/A;  . PERIPHERAL VASCULAR CATHETERIZATION Left 12/22/2014   Procedure: PORTA CATH INSERTION;  Surgeon: Robert Bellow, MD;  Location: [facility] OR[facility];  Service: General;  Laterality: Left;  .  PORTACATH PLACEMENT Right 12/22/2014   Procedure: INSERTION PORT-A-CATH;  Surgeon: Robert Bellow, MD;  Location: [facility] OR[facility];  Service: General;  Laterality: Right;  . SALPINGOOPHORECTOMY Bilateral 12/22/2014   Procedure: SALPINGO OOPHORECTOMY;  Surgeon: Gillis Ends, MD;  Location: [facility] OR[facility];  Service: Gynecology;  Laterality: Bilateral;  . UPPER GI ENDOSCOPY  09/25/04   hiatus hernia, schatzki ring and a single  gastric polyp    FAMILY HISTORY :   Family History  Problem Relation Age of Onset  . Cancer Mother        breast, throat, and stomach  . Breast cancer Mother   . Heart disease Father   . Pulmonary embolism Sister   . Cancer Brother 60       angiosarcoma of the chest; carcinoid small intestinal tumor  . Hypertension Brother   . Cancer Maternal Aunt        breast cancer  . Cancer Maternal Grandfather 92       pancreatic    SOCIAL HISTORY:   Social History   Tobacco Use  . Smoking status: Never Smoker  . Smokeless tobacco: Never Used  Substance Use Topics  . Alcohol use: No  . Drug use: No    ALLERGIES:  is allergic to carafate [sucralfate] and sulfa antibiotics.  MEDICATIONS:  Current Outpatient Medications  Medication Sig Dispense Refill  . acetaminophen (TYLENOL) 500 MG chewable tablet Chew 500 mg by mouth every 6 (six) hours as needed for pain.    . Cholecalciferol (VITAMIN D) 2000 units tablet Take 1 tablet (2,000 Units total) by mouth daily. 30 tablet 12  . Docosanol (ABREVA EX) Apply 1 application topically as needed. Fever blisters    . lactulose (CHRONULAC) 10 GM/15ML solution TAKE 15MLS (10G TOTAL) BY MOUTH THREE TIMES A DAY AS DIRECTED BY PHYSICIAN. 240 mL 1  . levothyroxine (SYNTHROID, LEVOTHROID) 50 MCG tablet TAKE 1 TABLET EVERY DAY ON AN EMPTY STOMACH WITH A GLASS OF WATER AT LEAST 30-60 MINUTES BEFORE BREAKFAST 30 tablet 12  . lidocaine-prilocaine (EMLA) cream Apply to port area 30-45 mins prior to chemo. 30 g 5  . lisinopril (PRINIVIL,ZESTRIL) 10 MG tablet Take 1 tablet (10 mg total) by mouth 2 (two) times daily. 60 tablet 4  . lisinopril-hydrochlorothiazide (PRINZIDE,ZESTORETIC) 20-25 MG tablet Take 1 tablet by mouth daily. 30 tablet 0  . LORazepam (ATIVAN) 0.5 MG tablet Take 0.5 tablets (0.25 mg total) by mouth at bedtime. 30 tablet 4  . Lysine 500 MG TABS Take 1 tablet by mouth daily as needed.     . Multiple Vitamin (MULTIVITAMIN) capsule Take 1 capsule  by mouth daily.    . ondansetron (ZOFRAN) 4 MG tablet Take 1 tablet (4 mg total) by mouth every 8 (eight) hours as needed for nausea or vomiting. 30 tablet 3  . pantoprazole (PROTONIX) 40 MG tablet TAKE 1 TABLET BY MOUTH DAILY 30 tablet 11  . polyethylene glycol powder (GLYCOLAX/MIRALAX) powder TAKE 17GM (LINE INSIDE CAP) IN EIGHT OUNCES OF WATER OR OTHER LIQUID DAILY 255 g 2  . prochlorperazine (COMPAZINE) 10 MG tablet Take 1 tablet (10 mg total) by mouth every 6 (six) hours as needed for nausea or vomiting. 30 tablet 0  . propafenone (RYTHMOL) 225 MG tablet Take 225 mg by mouth 2 (two) times daily.    . traMADol (ULTRAM) 50 MG tablet Take 1 tablet (50 mg total) by mouth every 6 (six) hours as needed. 30 tablet 0   No current facility-administered medications for this visit.  Facility-Administered Medications Ordered in Other Visits  Medication Dose Route Frequency Provider Last Rate Last Dose  . sodium chloride 0.9 % injection 10 mL  10 mL Intracatheter PRN Forest Gleason, MD   10 mL at 02/07/15 0930    PHYSICAL EXAMINATION: ECOG PERFORMANCE STATUS: 0 - Asymptomatic  BP 124/79 (BP Location: Left Arm, Patient Position: Sitting)   Pulse 86   Temp 97.9 F (36.6 C)   Resp 16   Wt 190 lb 8 oz (86.4 kg)   BMI 32.70 kg/m   Filed Weights   10/07/17 1023  Weight: 190 lb 8 oz (86.4 kg)    GENERAL: Well-nourished well-developed; Alert, no distress and comfort. Accompanied by her husband. EYES: no pallor or icterus OROPHARYNX: no thrush or ulceration; good dentition  NECK: supple, no masses felt LYMPH:  no palpable lymphadenopathy in the cervical, axillary or inguinal regions.  LUNGS: clear to auscultation and  No wheeze or crackles HEART/CVS: regular rate & rhythm and no murmurs; No lower extremity edema  ABDOMEN:abdomen soft, non-tender and normal bowel sounds Musculoskeletal:no cyanosis of digits and no clubbing. No redness, temperature warm and well perfused.  PSYCH: alert &  oriented x 3 with fluent speech NEURO: no focal motor/sensory deficits SKIN:  Normal. No rash noted.   LABORATORY DATA:  I have reviewed the data as listed    Component Value Date/Time   NA 138 10/07/2017 0956   NA 140 12/14/2014 1037   K 4.2 10/07/2017 0956   K 4.4 12/14/2014 1037   CL 107 10/07/2017 0956   CL 106 12/14/2014 1037   CO2 24 10/07/2017 0956   CO2 26 12/14/2014 1037   GLUCOSE 100 (H) 10/07/2017 0956   GLUCOSE 87 12/14/2014 1037   BUN 19 10/07/2017 0956   BUN 12 12/14/2014 1037   CREATININE 1.03 (H) 10/07/2017 0956   CREATININE 0.97 12/14/2014 1037   CALCIUM 9.3 10/07/2017 0956   CALCIUM 9.2 12/14/2014 1037   PROT 6.8 10/07/2017 0956   PROT 7.3 12/14/2014 1037   ALBUMIN 3.5 10/07/2017 0956   ALBUMIN 3.8 12/14/2014 1037   AST 21 10/07/2017 0956   AST 20 12/14/2014 1037   ALT 13 (L) 10/07/2017 0956   ALT 11 (L) 12/14/2014 1037   ALKPHOS 63 10/07/2017 0956   ALKPHOS 138 (H) 12/14/2014 1037   BILITOT 0.6 10/07/2017 0956   BILITOT 0.5 12/14/2014 1037   GFRNONAA 52 (L) 10/07/2017 0956   GFRNONAA 59 (L) 12/14/2014 1037   GFRAA >60 10/07/2017 0956   GFRAA >60 12/14/2014 1037    No results found for: SPEP, UPEP  Lab Results  Component Value Date   WBC 6.2 10/07/2017   NEUTROABS 2.6 10/07/2017   HGB 14.1 10/07/2017   HCT 41.8 10/07/2017   MCV 95.2 10/07/2017   PLT 262 10/07/2017      Chemistry      Component Value Date/Time   NA 138 10/07/2017 0956   NA 140 12/14/2014 1037   K 4.2 10/07/2017 0956   K 4.4 12/14/2014 1037   CL 107 10/07/2017 0956   CL 106 12/14/2014 1037   CO2 24 10/07/2017 0956   CO2 26 12/14/2014 1037   BUN 19 10/07/2017 0956   BUN 12 12/14/2014 1037   CREATININE 1.03 (H) 10/07/2017 0956   CREATININE 0.97 12/14/2014 1037   GLU 84 03/16/2014      Component Value Date/Time   CALCIUM 9.3 10/07/2017 0956   CALCIUM 9.2 12/14/2014 1037   ALKPHOS 63 10/07/2017 0956  ALKPHOS 138 (H) 12/14/2014 1037   AST 21 10/07/2017 0956    AST 20 12/14/2014 1037   ALT 13 (L) 10/07/2017 0956   ALT 11 (L) 12/14/2014 1037   BILITOT 0.6 10/07/2017 0956   BILITOT 0.5 12/14/2014 1037     IMPRESSION: 1. Status post hysterectomy, without recurrent or metastatic disease. 2.  Possible constipation. 3.  Tiny hiatal hernia. 4. Hepatic hemangiomas.  5. Cystic lesion within the right-side of the vagina is similar over prior exams and may represent a Bartholin's gland cyst.   Electronically Signed   By: Abigail Miyamoto M.D.   On: 03/28/2017 12:09 Results for AVANTIKA, SHERE (MRN [id number]) as of 04/24/2017 09:53  Ref. Range 12/08/2014 14:59 03/14/2015 15:13 06/08/2015 11:54 07/20/2015 11:43 09/13/2015 10:30 12/14/2015 10:02 01/04/2016 13:19 02/20/2016 09:58 03/21/2016 08:57 05/16/2016 11:14 06/13/2016 09:51 06/27/2016 09:50 08/15/2016 08:55 09/24/2016 09:03 10/22/2016 08:26 11/05/2016 08:28 11/26/2016 08:28 01/21/2017 09:13 02/25/2017 08:19 04/01/2017 08:40  CA 125 Latest Ref Range: 0.0 - 38.1 U/mL 2,354.0 (H) 9.8 14.9 14.5 17.8 24.4 34.1 47.5 (H) 31.1 37.5 44.8 (H) 50.4 (H) 64.5 (H) 101.7 (H) 18.2 13.0 12.6 12.8    Cancer Antigen (CA) 125 Latest Ref Range: 0.0 - 38.1 U/mL                   13.3 14.9    Results for AUTUMNROSE, YORE (MRN [id number]) as of 10/07/2017 10:29  Ref. Range 04/01/2017 08:40 05/13/2017 09:10 06/24/2017 09:00 08/21/2017 10:58 09/11/2017 08:22  Cancer Antigen (CA) 125 Latest Ref Range: 0.0 - 38.1 U/mL 14.9 14.3 13.9 19.6 22.4    RADIOGRAPHIC STUDIES: I have personally reviewed the radiological images as listed and agreed with the findings in the report. No results found.   ASSESSMENT & PLAN:  Ovarian cancer, unspecified laterality ([place]) Recurrent  Platinum refractory ovarian cancer [dec 2017] August 10 CT scan- improvement/resolution of the pelvic peritoneal nodularity. MRI pelvis NOV 2018- NED. # Jan 23rd 2019- Gynecological exam with Dr. Theora Gianotti- resolution of the palpable metastatic disease.   # Currently on Avastin maintenance.  No  concerns for clinical progression; tolearting well except for worsening headachches. Recommend CT a/p prior to next visit.   # Headaches/sinus congestion- Flonase- ? Claritin.  Every day; check MRI brain asap.    # Anxiety- ativan new cript given.  # Elevated Blood pressure-islightly elevated;take lisinopril 10 BID.   # follow up in feb 18th/monday- Margarette Asal UA/labs- ca-125.CT ab/pelvis in 3 weeks; MRI brain asap.    Orders Placed This Encounter  Procedures  . MR Brain W Wo Contrast    Standing Status:   Future    Standing Expiration Date:   10/07/2018    Order Specific Question:   If indicated for the ordered procedure, I authorize the administration of contrast media per Radiology protocol    Answer:   Yes    Order Specific Question:   What is the patient's sedation requirement?    Answer:   No Sedation    Order Specific Question:   Does the patient have a pacemaker or implanted devices?    Answer:   No    Order Specific Question:   Radiology Contrast Protocol - do NOT remove file path    Answer:   \\[email or web address]    Order Specific Question:   Preferred imaging location?    Answer:   [facility] (table limit-300lbs)  . CT Abdomen Pelvis W Contrast    Standing Status:   Future    Standing  Expiration Date:   10/07/2018    Order Specific Question:   If indicated for the ordered procedure, I authorize the administration of contrast media per Radiology protocol    Answer:   Yes    Order Specific Question:   Preferred imaging location?    Answer:   [facility]    Order Specific Question:   Radiology Contrast Protocol - do NOT remove file path    Answer:   \\[email or web address]  . CBC with Differential/Platelet    Standing Status:   Future    Standing Expiration Date:   10/07/2018  . Comprehensive metabolic panel    Standing Status:   Future    Standing Expiration Date:   10/07/2018  . CA 125    Standing Status:   Future     Standing Expiration Date:   10/07/2018     Cammie Sickle, MD 10/16/2017 10:24 AM

## 2017-10-07 NOTE — Progress Notes (Signed)
I met with patient today after her physician appointment and asked her to completed 24 month post randomization Quality of Life survey for research study follow up for GOG 0225.  Patient is currently in follow up status for GOG 0225.  Patient completed survey and this has been mailed to [employer].   I thanked patient for her continued study participation.

## 2017-10-07 NOTE — Assessment & Plan Note (Addendum)
Recurrent  Platinum refractory ovarian cancer [dec 2017] August 10 CT scan- improvement/resolution of the pelvic peritoneal nodularity. MRI pelvis NOV 2018- NED. # Jan 23rd 2019- Gynecological exam with Dr. Theora Gianotti- resolution of the palpable metastatic disease.   # Currently on Avastin maintenance.  No concerns for clinical progression; tolearting well except for worsening headachches. Recommend CT a/p prior to next visit.   # Headaches/sinus congestion- Flonase- ? Claritin.  Every day; check MRI brain asap.    # Anxiety- ativan new cript given.  # Elevated Blood pressure-islightly elevated;take lisinopril 10 BID.   # follow up in feb 18th/monday- Margarette Asal UA/labs- ca-125.CT ab/pelvis in 3 weeks; MRI brain asap.

## 2017-10-08 ENCOUNTER — Encounter: Payer: PPO | Admitting: Physical Therapy

## 2017-10-08 LAB — CA 125: CANCER ANTIGEN (CA) 125: 25.5 U/mL (ref 0.0–38.1)

## 2017-10-10 ENCOUNTER — Encounter: Payer: PPO | Admitting: Physical Therapy

## 2017-10-11 ENCOUNTER — Encounter: Payer: PPO | Admitting: Physical Therapy

## 2017-10-14 ENCOUNTER — Encounter: Payer: PPO | Admitting: Physical Therapy

## 2017-10-15 ENCOUNTER — Encounter: Payer: PPO | Admitting: Physical Therapy

## 2017-10-17 ENCOUNTER — Encounter: Payer: PPO | Admitting: Physical Therapy

## 2017-10-18 ENCOUNTER — Encounter: Payer: PPO | Admitting: Physical Therapy

## 2017-10-23 ENCOUNTER — Ambulatory Visit
Admission: RE | Admit: 2017-10-23 | Discharge: 2017-10-23 | Disposition: A | Discharge status: Home / Self Care | Payer: PPO | Source: Ambulatory Visit | Attending: Internal Medicine | Admitting: Internal Medicine

## 2017-10-23 DIAGNOSIS — R51 Headache: Secondary | ICD-10-CM | POA: Insufficient documentation

## 2017-10-23 DIAGNOSIS — I700 Atherosclerosis of aorta: Secondary | ICD-10-CM | POA: Diagnosis not present

## 2017-10-23 DIAGNOSIS — Z853 Personal history of malignant neoplasm of breast: Secondary | ICD-10-CM | POA: Insufficient documentation

## 2017-10-23 DIAGNOSIS — R935 Abnormal findings on diagnostic imaging of other abdominal regions, including retroperitoneum: Secondary | ICD-10-CM | POA: Insufficient documentation

## 2017-10-23 DIAGNOSIS — M899 Disorder of bone, unspecified: Secondary | ICD-10-CM | POA: Diagnosis not present

## 2017-10-23 DIAGNOSIS — C569 Malignant neoplasm of unspecified ovary: Secondary | ICD-10-CM

## 2017-10-23 DIAGNOSIS — R519 Headache, unspecified: Secondary | ICD-10-CM

## 2017-10-23 DIAGNOSIS — J349 Unspecified disorder of nose and nasal sinuses: Secondary | ICD-10-CM | POA: Diagnosis not present

## 2017-10-23 MED ORDER — IOPAMIDOL (ISOVUE-300) INJECTION 61%
100.0000 mL | Freq: Once | INTRAVENOUS | Status: AC | PRN
  Administered 2017-10-23: 100 mL via INTRAVENOUS

## 2017-10-23 MED ORDER — GADOBENATE DIMEGLUMINE 529 MG/ML IV SOLN
20.0000 mL | Freq: Once | INTRAVENOUS | Status: AC | PRN
  Administered 2017-10-23: 18 mL via INTRAVENOUS

## 2017-10-28 ENCOUNTER — Other Ambulatory Visit: Payer: Self-pay

## 2017-10-28 ENCOUNTER — Inpatient Hospital Stay: Payer: PPO | Attending: Internal Medicine

## 2017-10-28 ENCOUNTER — Encounter: Payer: Self-pay | Admitting: Internal Medicine

## 2017-10-28 ENCOUNTER — Inpatient Hospital Stay: Payer: PPO | Admitting: Internal Medicine

## 2017-10-28 ENCOUNTER — Inpatient Hospital Stay: Payer: PPO

## 2017-10-28 VITALS — BP 136/82 | HR 81 | Temp 87.2°F | Resp 12 | Ht 64.0 in | Wt 192.0 lb

## 2017-10-28 DIAGNOSIS — K219 Gastro-esophageal reflux disease without esophagitis: Secondary | ICD-10-CM

## 2017-10-28 DIAGNOSIS — R0981 Nasal congestion: Secondary | ICD-10-CM

## 2017-10-28 DIAGNOSIS — Z9223 Personal history of estrogen therapy: Secondary | ICD-10-CM | POA: Insufficient documentation

## 2017-10-28 DIAGNOSIS — Z803 Family history of malignant neoplasm of breast: Secondary | ICD-10-CM | POA: Diagnosis not present

## 2017-10-28 DIAGNOSIS — R51 Headache: Secondary | ICD-10-CM

## 2017-10-28 DIAGNOSIS — I700 Atherosclerosis of aorta: Secondary | ICD-10-CM

## 2017-10-28 DIAGNOSIS — Z923 Personal history of irradiation: Secondary | ICD-10-CM | POA: Diagnosis not present

## 2017-10-28 DIAGNOSIS — I10 Essential (primary) hypertension: Secondary | ICD-10-CM

## 2017-10-28 DIAGNOSIS — Z800 Family history of malignant neoplasm of digestive organs: Secondary | ICD-10-CM | POA: Diagnosis not present

## 2017-10-28 DIAGNOSIS — Z006 Encounter for examination for normal comparison and control in clinical research program: Secondary | ICD-10-CM | POA: Diagnosis not present

## 2017-10-28 DIAGNOSIS — C569 Malignant neoplasm of unspecified ovary: Secondary | ICD-10-CM | POA: Diagnosis not present

## 2017-10-28 DIAGNOSIS — E039 Hypothyroidism, unspecified: Secondary | ICD-10-CM

## 2017-10-28 DIAGNOSIS — Z9221 Personal history of antineoplastic chemotherapy: Secondary | ICD-10-CM | POA: Insufficient documentation

## 2017-10-28 DIAGNOSIS — E785 Hyperlipidemia, unspecified: Secondary | ICD-10-CM | POA: Diagnosis not present

## 2017-10-28 DIAGNOSIS — Z853 Personal history of malignant neoplasm of breast: Secondary | ICD-10-CM

## 2017-10-28 DIAGNOSIS — Z79899 Other long term (current) drug therapy: Secondary | ICD-10-CM | POA: Diagnosis not present

## 2017-10-28 DIAGNOSIS — I4891 Unspecified atrial fibrillation: Secondary | ICD-10-CM | POA: Insufficient documentation

## 2017-10-28 DIAGNOSIS — C786 Secondary malignant neoplasm of retroperitoneum and peritoneum: Secondary | ICD-10-CM | POA: Insufficient documentation

## 2017-10-28 LAB — URINALYSIS, COMPLETE (UACMP) WITH MICROSCOPIC
Bacteria, UA: NONE SEEN
Bilirubin Urine: NEGATIVE
Glucose, UA: NEGATIVE mg/dL
Hgb urine dipstick: NEGATIVE
Ketones, ur: NEGATIVE mg/dL
Nitrite: NEGATIVE
Protein, ur: NEGATIVE mg/dL
RBC / HPF: NONE SEEN RBC/hpf (ref 0–5)
SPECIFIC GRAVITY, URINE: 1.006 (ref 1.005–1.030)
pH: 6 (ref 5.0–8.0)

## 2017-10-28 LAB — CBC WITH DIFFERENTIAL/PLATELET
BASOS ABS: 0 10*3/uL (ref 0–0.1)
BASOS PCT: 1 %
EOS PCT: 3 %
Eosinophils Absolute: 0.2 10*3/uL (ref 0–0.7)
HCT: 41.7 % (ref 35.0–47.0)
Hemoglobin: 14.2 g/dL (ref 12.0–16.0)
LYMPHS PCT: 46 %
Lymphs Abs: 2.4 10*3/uL (ref 1.0–3.6)
MCH: 32.5 pg (ref 26.0–34.0)
MCHC: 34.1 g/dL (ref 32.0–36.0)
MCV: 95.3 fL (ref 80.0–100.0)
MONO ABS: 0.5 10*3/uL (ref 0.2–0.9)
MONOS PCT: 11 %
NEUTROS ABS: 2 10*3/uL (ref 1.4–6.5)
Neutrophils Relative %: 39 %
PLATELETS: 230 10*3/uL (ref 150–440)
RBC: 4.37 MIL/uL (ref 3.80–5.20)
RDW: 13.5 % (ref 11.5–14.5)
WBC: 5.1 10*3/uL (ref 3.6–11.0)

## 2017-10-28 LAB — COMPREHENSIVE METABOLIC PANEL
ALBUMIN: 3.5 g/dL (ref 3.5–5.0)
ALT: 15 U/L (ref 14–54)
AST: 31 U/L (ref 15–41)
Alkaline Phosphatase: 62 U/L (ref 38–126)
Anion gap: 10 (ref 5–15)
BUN: 18 mg/dL (ref 6–20)
CALCIUM: 9.3 mg/dL (ref 8.9–10.3)
CO2: 22 mmol/L (ref 22–32)
Chloride: 107 mmol/L (ref 101–111)
Creatinine, Ser: 1.14 mg/dL — ABNORMAL HIGH (ref 0.44–1.00)
GFR calc Af Amer: 54 mL/min — ABNORMAL LOW (ref >60)
GFR calc non Af Amer: 46 mL/min — ABNORMAL LOW (ref >60)
GLUCOSE: 117 mg/dL — AB (ref 65–99)
Potassium: 3.8 mmol/L (ref 3.5–5.1)
SODIUM: 139 mmol/L (ref 135–145)
Total Bilirubin: 0.6 mg/dL (ref 0.3–1.2)
Total Protein: 6.7 g/dL (ref 6.5–8.1)

## 2017-10-28 MED ORDER — SODIUM CHLORIDE 0.9% FLUSH
10.0000 mL | INTRAVENOUS | Status: DC | PRN
  Administered 2017-10-28: 10 mL via INTRAVENOUS
  Filled 2017-10-28: qty 10

## 2017-10-28 MED ORDER — HEPARIN SOD (PORK) LOCK FLUSH 100 UNIT/ML IV SOLN
500.0000 [IU] | Freq: Once | INTRAVENOUS | Status: AC
  Administered 2017-10-28: 500 [IU] via INTRAVENOUS
  Filled 2017-10-28: qty 5

## 2017-10-28 NOTE — Assessment & Plan Note (Addendum)
#   RECURRENT PLATINUM REFRACTORY HIGH GRADE SEROUS ovarian cancer [dec 2017] - on Avastin maintenance; march 8th CT- Progression; with possible cecal implants; increasing vaginal cuff thickness; awaiting CA 125 from today.  Recommend a PET scan for further confirmation of progression.   # Given the progression noted on the imaging; recommend changing to alternative treatment options.  Discussed regarding phase 1 clinical trial at [facility]; patient is interested.  Will inform Dr. Theora Gianotti.   #Also discussed with the patient and family regarding non-trial options including chemotherapy-gemcitabine/Topotecan etc.  Also will check [facility]-after checking with insurance/financial coverage.   # Headaches/sinus congestion- likely secondary to Avastin.  MRI brain-negative.  # Elevated Blood pressure-improved; continue lisinopril 10mg /day.  This should also improve since she is going to come off Avastin.  # check ca-125 today; PET scan asap; de-access the port; [facility]- will check with insurance/ Ulice Dash. Will inform Dr.Secord re: clinical trial.  # follow up in 3 weeks/labs Lab- No treatment for now.   # I reviewed the blood work- with the patient in detail; also reviewed the imaging independently [as summarized above]; and with the patient in detail.

## 2017-10-28 NOTE — Progress Notes (Signed)
[place] OFFICE PROGRESS NOTE  Patient Care Team: Jerrol Banana., MD as PCP - General (Family Medicine) Gillis Ends, MD as Referring Physician (Obstetrics and Gynecology) Bary Castilla Forest Gleason, MD as Consulting Physician (General Surgery)  Ovarian cancer [facility])   Staging form: Ovary, AJCC 7th Edition     Clinical: Stage IIIC (T3c, N0, M0) - Unsigned   Oncology History   # April- MAY 2016- HIGH GRADE SEROUS OVARIAN CANCER STAGE IIIC; CA-125 +2300+;  s/p OPTIMAL DEBULKING SURGERY   # MAY 2016[name] [finished in Sep 19th 2017]  # MAY CT 2017- RECURRENT/Peritoneal carcinomatosis/pelvic implant ~1.2cm/Ca-125-34;   # July 3rd- CARBO-DOXIL q 4W [with neulasta]; CT May 11 2016- slight increase peritoneal / stable nodules. Cont chemo [finished DEC 2017]. DEC 28th 2017 CT- Increase in peritoneal deposits; increasing Ca 125 [60s];  # Jan 2018- carbo-Taxol-Avastin x6 cycles; Aug 2018- CR; Aug 2018- start Avastin Maintenance; MARCH 2019- PROGRESSION- STOP avsatin.  # G-1-2 hand foot syndrome-   # LEFT BREAST CA s/p Lumpec & RT s/p TAM   # Afib [cardiology]; JUNE 2017- MUGA 62%  Jan 2018- MOLECULAR TESTING- ATM DELETERIOUS HETEROZYGOUS mutation; TUMOR BRCA- NEG; Myriad genomic instability- NEGATIVE;     Malignant neoplasm of ovary (HCC)    Ovarian cancer, unspecified laterality ([place])    INTERVAL HISTORY:  77 year old female patient with above history of ovarian cancer recurrent Platinum Refractory ovarian cancer [dec 2017]. Currently on maintenance Avastin is here for follow-up and reviewed the results of the restaging CAT scan.  Patient states her blood pressures have been stable.  She is currently taking lisinopril 10 mg twice a day.   No abdominal pain.  No constipation.  No swelling in the legs.  Denies any headaches.  Denies any nausea vomiting.   REVIEW OF SYSTEMS:  A complete 10 point review of system is done which is negative except  mentioned above/history of present illness.   PAST MEDICAL HISTORY :  Past Medical History:  Diagnosis Date  . Atrial fibrillation ([place])   . Breast cancer ([place]) 1998  . Cancer of breast ([place]) 1998   Left/radiation  . CINV (chemotherapy-induced nausea and vomiting)   . GERD (gastroesophageal reflux disease)   . Hyperlipidemia   . Hypothyroidism   . Mitral insufficiency   . Ovarian cancer ([place])    s/p BSO optimal tumor debulking May 2016/chemo  . Personal history of chemotherapy since 2016   ovarian cancer  . Personal history of radiation therapy 1998  . PVC (premature ventricular contraction)     PAST SURGICAL HISTORY :   Past Surgical History:  Procedure Laterality Date  . ABDOMINAL HYSTERECTOMY    . BILATERAL SALPINGOOPHORECTOMY  May 2016   with optimal tumor debulking   . BREAST LUMPECTOMY Left 1998  . BREAST SURGERY     Left  . CESAREAN SECTION     x2  . CHOLECYSTECTOMY    . COLONOSCOPY  2006  . DEBULKING N/A 12/22/2014   Procedure: DEBULKING;  Surgeon: Gillis Ends, MD;  Location: [facility] ORS;  Service: Gynecology;  Laterality: N/A;  . LAPAROTOMY N/A 12/22/2014   Procedure: EXPLORATORY LAPAROTOMY;  Surgeon: Robert Bellow, MD;  Location: [facility] ORS;  Service: General;  Laterality: N/A;  . LAPAROTOMY WITH STAGING N/A 12/22/2014   Procedure: LAPAROTOMY WITH STAGING;  Surgeon: Gillis Ends, MD;  Location: [facility] ORS;  Service: Gynecology;  Laterality: N/A;  . PERIPHERAL VASCULAR CATHETERIZATION Left 12/22/2014   Procedure: PORTA  CATH INSERTION;  Surgeon: Robert Bellow, MD;  Location: [facility] ORS;  Service: General;  Laterality: Left;  . PORTACATH PLACEMENT Right 12/22/2014   Procedure: INSERTION PORT-A-CATH;  Surgeon: Robert Bellow, MD;  Location: [facility] ORS;  Service: General;  Laterality: Right;  . SALPINGOOPHORECTOMY Bilateral 12/22/2014   Procedure: SALPINGO OOPHORECTOMY;  Surgeon: Gillis Ends, MD;  Location: [facility] ORS;  Service: Gynecology;   Laterality: Bilateral;  . UPPER GI ENDOSCOPY  09/25/04   hiatus hernia, schatzki ring and a single gastric polyp    FAMILY HISTORY :   Family History  Problem Relation Age of Onset  . Cancer Mother        breast, throat, and stomach  . Breast cancer Mother   . Heart disease Father   . Pulmonary embolism Sister   . Cancer Brother 60       angiosarcoma of the chest; carcinoid small intestinal tumor  . Hypertension Brother   . Cancer Maternal Aunt        breast cancer  . Cancer Maternal Grandfather 36       pancreatic    SOCIAL HISTORY:   Social History   Tobacco Use  . Smoking status: Never Smoker  . Smokeless tobacco: Never Used  Substance Use Topics  . Alcohol use: No  . Drug use: No    ALLERGIES:  is allergic to carafate [sucralfate] and sulfa antibiotics.  MEDICATIONS:  Current Outpatient Medications  Medication Sig Dispense Refill  . acetaminophen (TYLENOL) 500 MG chewable tablet Chew 500 mg by mouth every 6 (six) hours as needed for pain.    . Cholecalciferol (VITAMIN D) 2000 units tablet Take 1 tablet (2,000 Units total) by mouth daily. 30 tablet 12  . Docosanol (ABREVA EX) Apply 1 application topically as needed. Fever blisters    . lactulose (CHRONULAC) 10 GM/15ML solution TAKE 15MLS (10G TOTAL) BY MOUTH THREE TIMES A DAY AS DIRECTED BY PHYSICIAN. 240 mL 1  . levothyroxine (SYNTHROID, LEVOTHROID) 50 MCG tablet TAKE 1 TABLET EVERY DAY ON AN EMPTY STOMACH WITH A GLASS OF WATER AT LEAST 30-60 MINUTES BEFORE BREAKFAST 30 tablet 12  . lidocaine-prilocaine (EMLA) cream Apply to port area 30-45 mins prior to chemo. 30 g 5  . lisinopril (PRINIVIL,ZESTRIL) 10 MG tablet Take 1 tablet (10 mg total) by mouth 2 (two) times daily. 60 tablet 4  . LORazepam (ATIVAN) 0.5 MG tablet Take 0.5 tablets (0.25 mg total) by mouth at bedtime. 30 tablet 4  . Lysine 500 MG TABS Take 1 tablet by mouth daily as needed.     . magic mouthwash w/lidocaine SOLN Take 5 mLs by mouth every 4 (four)  hours as needed.  1  . Multiple Vitamin (MULTIVITAMIN) capsule Take 1 capsule by mouth daily.    . ondansetron (ZOFRAN) 4 MG tablet Take 1 tablet (4 mg total) by mouth every 8 (eight) hours as needed for nausea or vomiting. 30 tablet 3  . pantoprazole (PROTONIX) 40 MG tablet TAKE 1 TABLET BY MOUTH DAILY 30 tablet 11  . polyethylene glycol powder (GLYCOLAX/MIRALAX) powder TAKE 17GM (LINE INSIDE CAP) IN EIGHT OUNCES OF WATER OR OTHER LIQUID DAILY 255 g 2  . prochlorperazine (COMPAZINE) 10 MG tablet Take 1 tablet (10 mg total) by mouth every 6 (six) hours as needed for nausea or vomiting. 30 tablet 0  . propafenone (RYTHMOL) 225 MG tablet Take 225 mg by mouth 2 (two) times daily.    . traMADol (ULTRAM) 50 MG tablet Take 1 tablet (  50 mg total) by mouth every 6 (six) hours as needed. 30 tablet 0   No current facility-administered medications for this visit.    Facility-Administered Medications Ordered in Other Visits  Medication Dose Route Frequency Provider Last Rate Last Dose  . sodium chloride 0.9 % injection 10 mL  10 mL Intracatheter PRN Forest Gleason, MD   10 mL at 02/07/15 0930  . sodium chloride flush (NS) 0.9 % injection 10 mL  10 mL Intravenous PRN Cammie Sickle, MD   10 mL at 10/28/17 1031    PHYSICAL EXAMINATION: ECOG PERFORMANCE STATUS: 0 - Asymptomatic  BP 136/82 (BP Location: Left Arm, Patient Position: Sitting)   Pulse 81   Temp (!) 87.2 F (30.7 C) (Tympanic)   Resp 12   Ht '5\' 4"'$  (1.626 m)   Wt 192 lb (87.1 kg)   BMI 32.96 kg/m   Filed Weights   10/28/17 0929  Weight: 192 lb (87.1 kg)    GENERAL: Well-nourished well-developed; Alert, no distress and comfort. Accompanied by her husband. EYES: no pallor or icterus OROPHARYNX: no thrush or ulceration; good dentition  NECK: supple, no masses felt LYMPH:  no palpable lymphadenopathy in the cervical, axillary or inguinal regions.  LUNGS: clear to auscultation and  No wheeze or crackles HEART/CVS: regular rate &  rhythm and no murmurs; No lower extremity edema  ABDOMEN:abdomen soft, non-tender and normal bowel sounds Musculoskeletal:no cyanosis of digits and no clubbing. No redness, temperature warm and well perfused.  PSYCH: alert & oriented x 3 with fluent speech NEURO: no focal motor/sensory deficits SKIN:  Normal. No rash noted.   LABORATORY DATA:  I have reviewed the data as listed    Component Value Date/Time   NA 139 10/28/2017 0903   NA 140 12/14/2014 1037   K 3.8 10/28/2017 0903   K 4.4 12/14/2014 1037   CL 107 10/28/2017 0903   CL 106 12/14/2014 1037   CO2 22 10/28/2017 0903   CO2 26 12/14/2014 1037   GLUCOSE 117 (H) 10/28/2017 0903   GLUCOSE 87 12/14/2014 1037   BUN 18 10/28/2017 0903   BUN 12 12/14/2014 1037   CREATININE 1.14 (H) 10/28/2017 0903   CREATININE 0.97 12/14/2014 1037   CALCIUM 9.3 10/28/2017 0903   CALCIUM 9.2 12/14/2014 1037   PROT 6.7 10/28/2017 0903   PROT 7.3 12/14/2014 1037   ALBUMIN 3.5 10/28/2017 0903   ALBUMIN 3.8 12/14/2014 1037   AST 31 10/28/2017 0903   AST 20 12/14/2014 1037   ALT 15 10/28/2017 0903   ALT 11 (L) 12/14/2014 1037   ALKPHOS 62 10/28/2017 0903   ALKPHOS 138 (H) 12/14/2014 1037   BILITOT 0.6 10/28/2017 0903   BILITOT 0.5 12/14/2014 1037   GFRNONAA 46 (L) 10/28/2017 0903   GFRNONAA 59 (L) 12/14/2014 1037   GFRAA 54 (L) 10/28/2017 0903   GFRAA >60 12/14/2014 1037    No results found for: SPEP, UPEP  Lab Results  Component Value Date   WBC 5.1 10/28/2017   NEUTROABS 2.0 10/28/2017   HGB 14.2 10/28/2017   HCT 41.7 10/28/2017   MCV 95.3 10/28/2017   PLT 230 10/28/2017      Chemistry      Component Value Date/Time   NA 139 10/28/2017 0903   NA 140 12/14/2014 1037   K 3.8 10/28/2017 0903   K 4.4 12/14/2014 1037   CL 107 10/28/2017 0903   CL 106 12/14/2014 1037   CO2 22 10/28/2017 0903   CO2 26 12/14/2014  1037   BUN 18 10/28/2017 0903   BUN 12 12/14/2014 1037   CREATININE 1.14 (H) 10/28/2017 0903   CREATININE 0.97  12/14/2014 1037   GLU 84 03/16/2014      Component Value Date/Time   CALCIUM 9.3 10/28/2017 0903   CALCIUM 9.2 12/14/2014 1037   ALKPHOS 62 10/28/2017 0903   ALKPHOS 138 (H) 12/14/2014 1037   AST 31 10/28/2017 0903   AST 20 12/14/2014 1037   ALT 15 10/28/2017 0903   ALT 11 (L) 12/14/2014 1037   BILITOT 0.6 10/28/2017 0903   BILITOT 0.5 12/14/2014 1037     IMPRESSION: 1. There is apparent increase soft tissue thickening around the cecal base which appears new from previous exam. Although nonspecific and can occur for variety of reasons including incomplete distention, serosal deposits reflecting peritoneal disease cannot be excluded. Consider further evaluation with PET-CT. 2. New asymmetric soft tissue thickening along the left side of vaginal cuff. 3. Aortic atherosclerosis   Electronically Signed   By: Kerby Moors M.D.   On: 10/23/2017 10:56   Results for DOMINIQUE, RESSEL (MRN [id number]) as of 10/28/2017 09:46  Ref. Range 05/13/2017 09:10 06/24/2017 09:00 08/21/2017 10:58 09/11/2017 08:22 10/07/2017 09:56  Cancer Antigen (CA) 125 Latest Ref Range: 0.0 - 38.1 U/mL 14.3 13.9 19.6 22.4 25.5    RADIOGRAPHIC STUDIES: I have personally reviewed the radiological images as listed and agreed with the findings in the report. No results found.   ASSESSMENT & PLAN:  Ovarian cancer, unspecified laterality ([place]) # RECURRENT PLATINUM REFRACTORY HIGH GRADE SEROUS ovarian cancer [dec 2017] - on Avastin maintenance; march 8th CT- Progression; with possible cecal implants; increasing vaginal cuff thickness; awaiting CA 125 from today.  Recommend a PET scan for further confirmation of progression.   # Given the progression noted on the imaging; recommend changing to alternative treatment options.  Discussed regarding phase 1 clinical trial at [facility]; patient is interested.  Will inform Dr. Theora Gianotti.   #Also discussed with the patient and family regarding non-trial options including  chemotherapy-gemcitabine/Topotecan etc.  Also will check [facility]-after checking with insurance/financial coverage.   # Headaches/sinus congestion- likely secondary to Avastin.  MRI brain-negative.  # Elevated Blood pressure-improved; continue lisinopril '10mg'$ /day.  This should also improve since she is going to come off Avastin.  # check ca-125 today; PET scan asap; de-access the port; [facility]- will check with insurance/ Ulice Dash. Will inform Dr.Secord re: clinical trial.  # follow up in 3 weeks/labs Lab- No treatment for now.   # I reviewed the blood work- with the patient in detail; also reviewed the imaging independently [as summarized above]; and with the patient in detail.    Orders Placed This Encounter  Procedures  . NM PET Image Restag (PS) Skull Base To Thigh    Standing Status:   Future    Standing Expiration Date:   10/28/2018    Order Specific Question:   If indicated for the ordered procedure, I authorize the administration of a radiopharmaceutical per Radiology protocol    Answer:   Yes    Order Specific Question:   Preferred imaging location?    Answer:   [facility]    Order Specific Question:   Radiology Contrast Protocol - do NOT remove file path    Answer:   [email or web address]  . CA 125    Standing Status:   Future    Number of Occurrences:   1    Standing Expiration Date:   10/29/2018  .  CBC with Differential    Standing Status:   Future    Standing Expiration Date:   10/29/2018  . Comprehensive metabolic panel    Standing Status:   Future    Standing Expiration Date:   10/29/2018  . CA 125    Standing Status:   Future    Standing Expiration Date:   10/29/2018     Cammie Sickle, MD 10/28/2017 12:53 PM

## 2017-10-28 NOTE — Progress Notes (Signed)
Patient here for results. She has no complaints today.

## 2017-10-29 LAB — CA 125: Cancer Antigen (CA) 125: 32.4 U/mL (ref 0.0–38.1)

## 2017-10-30 ENCOUNTER — Other Ambulatory Visit: Payer: PPO

## 2017-10-30 ENCOUNTER — Telehealth: Payer: Self-pay

## 2017-10-30 NOTE — Telephone Encounter (Signed)
Call placed to Ms. Jamie Conner regarding her interest in clinical trial at [facility] and to arrange appointment with Dr. Unknown Foley. After speaking with Ms. Jamie Conner she states she was "shocked". She is not interested in making the appointment at this time and would like to wait for her PET results before making any further decisions regarding the trial. PET is scheduled for 3/18. Will review PET results with Dr. Rogue Bussing and revisit the trial following. Oncology Nurse Navigator Documentation  Navigator Location: [facility] (10/30/17 1100)   )Navigator Encounter Type: Telephone (10/30/17 1100) Telephone: Outgoing Call (10/30/17 1100)                           Interventions: Coordination of Care (10/30/17 1100)                      Time Spent with Patient: 15 (10/30/17 1100)

## 2017-11-04 ENCOUNTER — Ambulatory Visit
Admission: RE | Admit: 2017-11-04 | Discharge: 2017-11-04 | Disposition: A | Discharge status: Home / Self Care | Payer: PPO | Source: Ambulatory Visit | Attending: Internal Medicine | Admitting: Internal Medicine

## 2017-11-04 DIAGNOSIS — D1803 Hemangioma of intra-abdominal structures: Secondary | ICD-10-CM | POA: Diagnosis not present

## 2017-11-04 DIAGNOSIS — D4989 Neoplasm of unspecified behavior of other specified sites: Secondary | ICD-10-CM | POA: Diagnosis not present

## 2017-11-04 DIAGNOSIS — K449 Diaphragmatic hernia without obstruction or gangrene: Secondary | ICD-10-CM | POA: Diagnosis not present

## 2017-11-04 DIAGNOSIS — R9389 Abnormal findings on diagnostic imaging of other specified body structures: Secondary | ICD-10-CM | POA: Insufficient documentation

## 2017-11-04 DIAGNOSIS — I700 Atherosclerosis of aorta: Secondary | ICD-10-CM | POA: Insufficient documentation

## 2017-11-04 DIAGNOSIS — K5730 Diverticulosis of large intestine without perforation or abscess without bleeding: Secondary | ICD-10-CM | POA: Diagnosis not present

## 2017-11-04 DIAGNOSIS — C569 Malignant neoplasm of unspecified ovary: Secondary | ICD-10-CM | POA: Insufficient documentation

## 2017-11-04 LAB — GLUCOSE, CAPILLARY: GLUCOSE-CAPILLARY: 72 mg/dL (ref 65–99)

## 2017-11-04 MED ORDER — FLUDEOXYGLUCOSE F - 18 (FDG) INJECTION
10.4500 | Freq: Once | INTRAVENOUS | Status: AC | PRN
  Administered 2017-11-04: 10.45 via INTRAVENOUS

## 2017-11-07 ENCOUNTER — Telehealth: Payer: Self-pay

## 2017-11-07 NOTE — Telephone Encounter (Signed)
Notified Jamie Conner of PET results. Pet scan shows progressive disease as suspected on previous CT. Dr. Rogue Bussing still recommends clinical trial evaluation at [facility]. She states she is still not interested at this time. She will keep her appointment on 4/1 to discuss treatment plan further with Dr. Rogue Bussing. Oncology Nurse Navigator Documentation  Navigator Location: [facility] (11/07/17 1500)   )Navigator Encounter Type: Telephone (11/07/17 1500) Telephone: Outgoing Call;Diagnostic Results (11/07/17 1500)                                                  Time Spent with Patient: 15 (11/07/17 1500)

## 2017-11-09 ENCOUNTER — Ambulatory Visit (INDEPENDENT_AMBULATORY_CARE_PROVIDER_SITE_OTHER): Payer: PPO | Admitting: Physician Assistant

## 2017-11-09 ENCOUNTER — Encounter: Payer: Self-pay | Admitting: Physician Assistant

## 2017-11-09 VITALS — BP 118/76 | HR 86 | Temp 98.3°F | Resp 16 | Wt 188.0 lb

## 2017-11-09 DIAGNOSIS — J40 Bronchitis, not specified as acute or chronic: Secondary | ICD-10-CM | POA: Diagnosis not present

## 2017-11-09 DIAGNOSIS — E069 Thyroiditis, unspecified: Secondary | ICD-10-CM | POA: Diagnosis not present

## 2017-11-09 MED ORDER — AMOXICILLIN-POT CLAVULANATE 875-125 MG PO TABS: 1.0000 | ORAL_TABLET | Freq: Two times a day (BID) | ORAL | 0 refills | Status: DC

## 2017-11-09 MED ORDER — GUAIFENESIN-DM 100-10 MG/5ML PO SYRP: 5.0000 mL | ORAL_SOLUTION | ORAL | 0 refills | Status: DC | PRN

## 2017-11-09 NOTE — Progress Notes (Signed)
Patient: Jamie Conner Female    DOB: [birth year]   77 y.o.   MRN: [id number] Visit Date: 11/09/2017  Today's Provider: Mar Daring, PA-C   Chief Complaint  Patient presents with  . Cough   Subjective:    Cough  This is a new problem. The current episode started in the past 7 days. The problem has been gradually worsening. The cough is non-productive. Associated symptoms include ear congestion, a fever, postnasal drip and a sore throat. She has tried OTC cough suppressant (and tylenol) for the symptoms. The treatment provided no relief. Her past medical history is significant for environmental allergies. There is no history of asthma.      Allergies  Allergen Reactions  . Carafate [Sucralfate] Rash  . Sulfa Antibiotics Rash     Current Outpatient Medications:  .  acetaminophen (TYLENOL) 500 MG chewable tablet, Chew 500 mg by mouth every 6 (six) hours as needed for pain., Disp: , Rfl:  .  Cholecalciferol (VITAMIN D) 2000 units tablet, Take 1 tablet (2,000 Units total) by mouth daily., Disp: 30 tablet, Rfl: 12 .  Docosanol (ABREVA EX), Apply 1 application topically as needed. Fever blisters, Disp: , Rfl:  .  levothyroxine (SYNTHROID, LEVOTHROID) 50 MCG tablet, TAKE 1 TABLET EVERY DAY ON AN EMPTY STOMACH WITH A GLASS OF WATER AT LEAST 30-60 MINUTES BEFORE BREAKFAST, Disp: 30 tablet, Rfl: 12 .  lidocaine-prilocaine (EMLA) cream, Apply to port area 30-45 mins prior to chemo., Disp: 30 g, Rfl: 5 .  lisinopril (PRINIVIL,ZESTRIL) 10 MG tablet, Take 1 tablet (10 mg total) by mouth 2 (two) times daily., Disp: 60 tablet, Rfl: 4 .  LORazepam (ATIVAN) 0.5 MG tablet, Take 0.5 tablets (0.25 mg total) by mouth at bedtime., Disp: 30 tablet, Rfl: 4 .  Lysine 500 MG TABS, Take 1 tablet by mouth daily as needed. , Disp: , Rfl:  .  Multiple Vitamin (MULTIVITAMIN) capsule, Take 1 capsule by mouth daily., Disp: , Rfl:  .  ondansetron (ZOFRAN) 4 MG tablet, Take 1 tablet (4 mg total) by  mouth every 8 (eight) hours as needed for nausea or vomiting., Disp: 30 tablet, Rfl: 3 .  pantoprazole (PROTONIX) 40 MG tablet, TAKE 1 TABLET BY MOUTH DAILY, Disp: 30 tablet, Rfl: 11 .  polyethylene glycol powder (GLYCOLAX/MIRALAX) powder, TAKE 17GM (LINE INSIDE CAP) IN EIGHT OUNCES OF WATER OR OTHER LIQUID DAILY, Disp: 255 g, Rfl: 2 .  prochlorperazine (COMPAZINE) 10 MG tablet, Take 1 tablet (10 mg total) by mouth every 6 (six) hours as needed for nausea or vomiting., Disp: 30 tablet, Rfl: 0 .  lactulose (CHRONULAC) 10 GM/15ML solution, TAKE 15MLS (10G TOTAL) BY MOUTH THREE TIMES A DAY AS DIRECTED BY PHYSICIAN. (Patient not taking: Reported on 11/09/2017), Disp: 240 mL, Rfl: 1 .  magic mouthwash w/lidocaine SOLN, Take 5 mLs by mouth every 4 (four) hours as needed., Disp: , Rfl: 1 .  propafenone (RYTHMOL) 225 MG tablet, Take 225 mg by mouth 2 (two) times daily., Disp: , Rfl:  .  traMADol (ULTRAM) 50 MG tablet, Take 1 tablet (50 mg total) by mouth every 6 (six) hours as needed. (Patient not taking: Reported on 11/09/2017), Disp: 30 tablet, Rfl: 0 No current facility-administered medications for this visit.   Facility-Administered Medications Ordered in Other Visits:  .  sodium chloride 0.9 % injection 10 mL, 10 mL, Intracatheter, PRN, Choksi, Janak, MD, 10 mL at 02/07/15 0930  Review of Systems  Constitutional: Positive for fever.  HENT: Positive for postnasal drip and sore throat.   Respiratory: Positive for cough.   Allergic/Immunologic: Positive for environmental allergies.    Social History   Tobacco Use  . Smoking status: Never Smoker  . Smokeless tobacco: Never Used  Substance Use Topics  . Alcohol use: No   Objective:   BP 118/76 (BP Location: Left Arm, Patient Position: Sitting, Cuff Size: Large)   Pulse 86   Temp 98.3 F (36.8 C)   Resp 16   Wt 188 lb (85.3 kg)   SpO2 97%   BMI 32.27 kg/m  Vitals:   11/09/17 0956  BP: 118/76  Pulse: 86  Resp: 16  Temp: 98.3 F (36.8  C)  SpO2: 97%  Weight: 188 lb (85.3 kg)     Physical Exam  Constitutional: She appears well-developed and well-nourished. No distress.  HENT:  Head: Normocephalic and atraumatic.  Right Ear: Hearing, tympanic membrane, external ear and ear canal normal.  Left Ear: Hearing, tympanic membrane, external ear and ear canal normal.  Nose: Nose normal.  Mouth/Throat: Uvula is midline, oropharynx is clear and moist and mucous membranes are normal. No oropharyngeal exudate.  Eyes: Pupils are equal, round, and reactive to light. Conjunctivae are normal. Right eye exhibits no discharge. Left eye exhibits no discharge. No scleral icterus.  Neck: Normal range of motion. Neck supple. No tracheal deviation present. No thyromegaly present.  Cardiovascular: Normal rate, regular rhythm and normal heart sounds. Exam reveals no gallop and no friction rub.  No murmur heard. Pulmonary/Chest: Effort normal. No stridor. No respiratory distress. She has decreased breath sounds. She has no wheezes. She has no rhonchi. She has rales in the right upper field, the left upper field and the left middle field.  Lymphadenopathy:    She has no cervical adenopathy.  Skin: Skin is warm and dry. She is not diaphoretic.  Vitals reviewed.      Assessment & Plan:     1. Bronchitis Worsening symptoms that have not responded to OTC medications. Will give augmentin as below. Tussin DM for cough. Continue allergy medications. Stay well hydrated and get plenty of rest. Call if no symptom improvement or if symptoms worsen. - amoxicillin-clavulanate (AUGMENTIN) 875-125 MG tablet; Take 1 tablet by mouth 2 (two) times daily.  Dispense: 20 tablet; Refill: 0 - guaiFENesin-dextromethorphan (ROBITUSSIN DM) 100-10 MG/5ML syrup; Take 5 mLs by mouth every 4 (four) hours as needed for cough.  Dispense: 140 mL; Refill: 0  2. Thyroiditis Noted to have increased uptake on PET scan for ovarian cancer last week indicating thyroiditis. Patient  has not been seen since 12/2016 (acute visit) and has not had thyroid checked in over a year. Labs ordered as below. I will f/u pending labs. - T4 AND TSH       Mar Daring, PA-C  [facility]

## 2017-11-18 ENCOUNTER — Encounter: Payer: Self-pay | Admitting: Internal Medicine

## 2017-11-18 ENCOUNTER — Other Ambulatory Visit: Payer: Self-pay

## 2017-11-18 ENCOUNTER — Inpatient Hospital Stay: Payer: PPO | Admitting: Internal Medicine

## 2017-11-18 ENCOUNTER — Inpatient Hospital Stay: Payer: PPO | Attending: Internal Medicine

## 2017-11-18 VITALS — BP 138/83 | HR 73 | Temp 97.9°F | Resp 18 | Ht 64.0 in

## 2017-11-18 DIAGNOSIS — I700 Atherosclerosis of aorta: Secondary | ICD-10-CM | POA: Insufficient documentation

## 2017-11-18 DIAGNOSIS — R079 Chest pain, unspecified: Secondary | ICD-10-CM | POA: Insufficient documentation

## 2017-11-18 DIAGNOSIS — I4891 Unspecified atrial fibrillation: Secondary | ICD-10-CM | POA: Insufficient documentation

## 2017-11-18 DIAGNOSIS — Z853 Personal history of malignant neoplasm of breast: Secondary | ICD-10-CM | POA: Insufficient documentation

## 2017-11-18 DIAGNOSIS — E039 Hypothyroidism, unspecified: Secondary | ICD-10-CM

## 2017-11-18 DIAGNOSIS — Z90722 Acquired absence of ovaries, bilateral: Secondary | ICD-10-CM | POA: Diagnosis not present

## 2017-11-18 DIAGNOSIS — E785 Hyperlipidemia, unspecified: Secondary | ICD-10-CM

## 2017-11-18 DIAGNOSIS — Z803 Family history of malignant neoplasm of breast: Secondary | ICD-10-CM | POA: Insufficient documentation

## 2017-11-18 DIAGNOSIS — K2970 Gastritis, unspecified, without bleeding: Secondary | ICD-10-CM | POA: Insufficient documentation

## 2017-11-18 DIAGNOSIS — R531 Weakness: Secondary | ICD-10-CM | POA: Diagnosis not present

## 2017-11-18 DIAGNOSIS — Z79899 Other long term (current) drug therapy: Secondary | ICD-10-CM

## 2017-11-18 DIAGNOSIS — I429 Cardiomyopathy, unspecified: Secondary | ICD-10-CM | POA: Insufficient documentation

## 2017-11-18 DIAGNOSIS — I10 Essential (primary) hypertension: Secondary | ICD-10-CM

## 2017-11-18 DIAGNOSIS — Z9223 Personal history of estrogen therapy: Secondary | ICD-10-CM | POA: Insufficient documentation

## 2017-11-18 DIAGNOSIS — Z800 Family history of malignant neoplasm of digestive organs: Secondary | ICD-10-CM | POA: Insufficient documentation

## 2017-11-18 DIAGNOSIS — C569 Malignant neoplasm of unspecified ovary: Secondary | ICD-10-CM | POA: Insufficient documentation

## 2017-11-18 DIAGNOSIS — Z923 Personal history of irradiation: Secondary | ICD-10-CM | POA: Diagnosis not present

## 2017-11-18 DIAGNOSIS — K219 Gastro-esophageal reflux disease without esophagitis: Secondary | ICD-10-CM | POA: Diagnosis not present

## 2017-11-18 DIAGNOSIS — C786 Secondary malignant neoplasm of retroperitoneum and peritoneum: Secondary | ICD-10-CM | POA: Insufficient documentation

## 2017-11-18 DIAGNOSIS — R600 Localized edema: Secondary | ICD-10-CM | POA: Insufficient documentation

## 2017-11-18 DIAGNOSIS — Z9221 Personal history of antineoplastic chemotherapy: Secondary | ICD-10-CM

## 2017-11-18 DIAGNOSIS — J209 Acute bronchitis, unspecified: Secondary | ICD-10-CM

## 2017-11-18 DIAGNOSIS — Z7982 Long term (current) use of aspirin: Secondary | ICD-10-CM | POA: Insufficient documentation

## 2017-11-18 DIAGNOSIS — I480 Paroxysmal atrial fibrillation: Secondary | ICD-10-CM | POA: Diagnosis not present

## 2017-11-18 DIAGNOSIS — J069 Acute upper respiratory infection, unspecified: Secondary | ICD-10-CM | POA: Diagnosis not present

## 2017-11-18 DIAGNOSIS — G8918 Other acute postprocedural pain: Secondary | ICD-10-CM | POA: Diagnosis not present

## 2017-11-18 DIAGNOSIS — Z452 Encounter for adjustment and management of vascular access device: Secondary | ICD-10-CM | POA: Insufficient documentation

## 2017-11-18 DIAGNOSIS — J329 Chronic sinusitis, unspecified: Secondary | ICD-10-CM | POA: Insufficient documentation

## 2017-11-18 LAB — CBC WITH DIFFERENTIAL/PLATELET
BASOS ABS: 0.1 10*3/uL (ref 0–0.1)
BASOS PCT: 1 %
Eosinophils Absolute: 0.1 10*3/uL (ref 0–0.7)
Eosinophils Relative: 2 %
HEMATOCRIT: 42.6 % (ref 35.0–47.0)
Hemoglobin: 14.5 g/dL (ref 12.0–16.0)
Lymphocytes Relative: 53 %
Lymphs Abs: 3 10*3/uL (ref 1.0–3.6)
MCH: 32.5 pg (ref 26.0–34.0)
MCHC: 34 g/dL (ref 32.0–36.0)
MCV: 95.4 fL (ref 80.0–100.0)
Monocytes Absolute: 0.7 10*3/uL (ref 0.2–0.9)
Monocytes Relative: 11 %
NEUTROS ABS: 1.9 10*3/uL (ref 1.4–6.5)
NEUTROS PCT: 33 %
Platelets: 248 10*3/uL (ref 150–440)
RBC: 4.47 MIL/uL (ref 3.80–5.20)
RDW: 13.3 % (ref 11.5–14.5)
WBC: 5.7 10*3/uL (ref 3.6–11.0)

## 2017-11-18 LAB — COMPREHENSIVE METABOLIC PANEL
ALK PHOS: 64 U/L (ref 38–126)
ALT: 16 U/L (ref 14–54)
ANION GAP: 10 (ref 5–15)
AST: 25 U/L (ref 15–41)
Albumin: 3.6 g/dL (ref 3.5–5.0)
BILIRUBIN TOTAL: 0.6 mg/dL (ref 0.3–1.2)
BUN: 16 mg/dL (ref 6–20)
CO2: 23 mmol/L (ref 22–32)
Calcium: 9.6 mg/dL (ref 8.9–10.3)
Chloride: 103 mmol/L (ref 101–111)
Creatinine, Ser: 1.05 mg/dL — ABNORMAL HIGH (ref 0.44–1.00)
GFR, EST AFRICAN AMERICAN: 59 mL/min — AB (ref >60)
GFR, EST NON AFRICAN AMERICAN: 51 mL/min — AB (ref >60)
Glucose, Bld: 81 mg/dL (ref 65–99)
Potassium: 4.5 mmol/L (ref 3.5–5.1)
Sodium: 136 mmol/L (ref 135–145)
TOTAL PROTEIN: 6.8 g/dL (ref 6.5–8.1)

## 2017-11-18 MED ORDER — LISINOPRIL 10 MG PO TABS: 10.0000 mg | ORAL_TABLET | Freq: Every day | ORAL | 4 refills | Status: DC

## 2017-11-18 NOTE — Progress Notes (Signed)
Patient recently treated for acute bronchitis. Was treated with Augumentin. She still reports a post nasal drip. She is on her last day of antibiotics.  She would like to discuss BP mgmt. She does not have enough lisinopril tablets at home. A new prescription will need [facility]  bp readings: 11/02/17 - 139/75 11/07/17 - 128/75 11/09/17-135/84 11/11/17 - 128/73 11/13/17 - 136/81 11/17/17 - 147/72

## 2017-11-18 NOTE — Assessment & Plan Note (Addendum)
#  RECURRENT PLATINUM REFRACTORY HIGH GRADE SEROUS ovarian cancer [dec 2017] - on Avastin maintenance; march 8th CT/PET- Progression; with possible cecal implants; increasing vaginal cuff thickness.  # Given the progression noted on the imaging; recommend changing to alternative treatment options- Gemcitabine [ discussed potential SEs-including but not limited to skin rash/fevers.] Vs. PARP inhibitors [pt is positive for somatic ATM; neg BRCA/HRD ]   # Pt declines- clinical trial evaluation [phase I/insurance] [facility] Vs Omniseq- after checking with insurance/financial coverage.   # Acute bronchitis- s/p augumentin- improved  # Elevated Blood pressure-improved; continue lisinopril '10mg'$ /day. New script given.    # check ca-125 today; PET scan asap; de-access the port; [facility]- will check with insurance.   # follow up to be decided/ based after discussion with gyn-Onc/Dr.Secord.    # I reviewed the blood work- with the patient in detail; also reviewed the imaging independently [as summarized above]; and with the patient in detail.

## 2017-11-18 NOTE — Progress Notes (Signed)
[place] OFFICE PROGRESS NOTE  Patient Care Team: Jerrol Banana., MD as PCP - General (Family Medicine) Gillis Ends, MD as Referring Physician (Obstetrics and Gynecology) Bary Castilla Forest Gleason, MD as Consulting Physician (General Surgery)  Ovarian cancer [facility]) patient   Staging form: Ovary, AJCC 7th Edition     Clinical: Stage IIIC (T3c, N0, M0) - Unsigned   Oncology History   # April- MAY 2016- HIGH GRADE SEROUS OVARIAN CANCER STAGE IIIC; CA-125 +2300+;  s/p OPTIMAL DEBULKING SURGERY   # MAY 2016[name] [finished in Sep 19th 2017]  # MAY CT 2017- RECURRENT/Peritoneal carcinomatosis/pelvic implant ~1.2cm/Ca-125-34;   # July 3rd 2017- CARBO-DOXIL q 4W [with neulasta]; CT May 11 2016- slight increase peritoneal / stable nodules. Cont chemo [finished DEC 2017]. DEC 28th 2017 CT- Increase in peritoneal deposits; increasing Ca 125 [60s];  # Jan 2018- carbo-Taxol-Avastin x6 cycles; Aug 2018- CR; Aug 2018- start Avastin Maintenance; MARCH 2019- PROGRESSION- STOP avastin. .  # G-1-2 hand foot syndrome-   # LEFT BREAST CA s/p Lumpec & RT s/p TAM   # Afib [cardiology]; JUNE 2017- MUGA 62%  Jan 2018- MOLECULAR TESTING- ATM DELETERIOUS HETEROZYGOUS mutation; TUMOR BRCA- NEG; Myriad genomic instability- NEGATIVE;     Malignant neoplasm of ovary (HCC)    Ovarian cancer, unspecified laterality ([place])    INTERVAL HISTORY:  77 year old female patient with above history of ovarian cancer recurrent Platinum Refractory ovarian cancer [dec 2017]. Currently on maintenance Avastin is here for follow-up and results of the PET scan.  Patient states she has been recently diagnosed with acute bronchitis; symptoms are resolving.  Patient is currently status post Augmentin.   No abdominal pain.  No constipation.  No swelling in the legs.  Denies any headaches.  Denies any nausea vomiting.   REVIEW OF SYSTEMS:  A complete 10 point review of system is done  which is negative except mentioned above/history of present illness.   PAST MEDICAL HISTORY :  Past Medical History:  Diagnosis Date  . Atrial fibrillation ([place])   . Breast cancer ([place]) 1998  . Cancer of breast ([place]) 1998   Left/radiation  . CINV (chemotherapy-induced nausea and vomiting)   . GERD (gastroesophageal reflux disease)   . Hyperlipidemia   . Hypothyroidism   . Mitral insufficiency   . Ovarian cancer ([place])    s/p BSO optimal tumor debulking May 2016/chemo  . Personal history of chemotherapy since 2016   ovarian cancer  . Personal history of radiation therapy 1998  . PVC (premature ventricular contraction)     PAST SURGICAL HISTORY :   Past Surgical History:  Procedure Laterality Date  . ABDOMINAL HYSTERECTOMY    . BILATERAL SALPINGOOPHORECTOMY  May 2016   with optimal tumor debulking   . BREAST LUMPECTOMY Left 1998  . BREAST SURGERY     Left  . CESAREAN SECTION     x2  . CHOLECYSTECTOMY    . COLONOSCOPY  2006  . DEBULKING N/A 12/22/2014   Procedure: DEBULKING;  Surgeon: Gillis Ends, MD;  Location: [facility] OR[facility];  Service: Gynecology;  Laterality: N/A;  . LAPAROTOMY N/A 12/22/2014   Procedure: EXPLORATORY LAPAROTOMY;  Surgeon: Robert Bellow, MD;  Location: [facility];  Service: General;  Laterality: N/A;  . LAPAROTOMY WITH STAGING N/A 12/22/2014   Procedure: LAPAROTOMY WITH STAGING;  Surgeon: Gillis Ends, MD;  Location: [facility];  Service: Gynecology;  Laterality: N/A;  . PERIPHERAL VASCULAR CATHETERIZATION Left 12/22/2014   Procedure:  PORTA CATH INSERTION;  Surgeon: Robert Bellow, MD;  Location: [facility];  Service: General;  Laterality: Left;  . PORTACATH PLACEMENT Right 12/22/2014   Procedure: INSERTION PORT-A-CATH;  Surgeon: Robert Bellow, MD;  Location: [facility];  Service: General;  Laterality: Right;  . SALPINGOOPHORECTOMY Bilateral 12/22/2014   Procedure: SALPINGO OOPHORECTOMY;  Surgeon: Gillis Ends, MD;  Location: [facility];   Service: Gynecology;  Laterality: Bilateral;  . UPPER GI ENDOSCOPY  09/25/04   hiatus hernia, schatzki ring and a single gastric polyp    FAMILY HISTORY :   Family History  Problem Relation Age of Onset  . Cancer Mother        breast, throat, and stomach  . Breast cancer Mother   . Heart disease Father   . Pulmonary embolism Sister   . Cancer Brother 60       angiosarcoma of the chest; carcinoid small intestinal tumor  . Hypertension Brother   . Cancer Maternal Aunt        breast cancer  . Cancer Maternal Grandfather 34       pancreatic    SOCIAL HISTORY:   Social History   Tobacco Use  . Smoking status: Never Smoker  . Smokeless tobacco: Never Used  Substance Use Topics  . Alcohol use: No  . Drug use: No    ALLERGIES:  is allergic to carafate [sucralfate] and sulfa antibiotics.  MEDICATIONS:  Current Outpatient Medications  Medication Sig Dispense Refill  . amoxicillin-clavulanate (AUGMENTIN) 875-125 MG tablet Take 1 tablet by mouth 2 (two) times daily. 20 tablet 0  . Cholecalciferol (VITAMIN D) 2000 units tablet Take 1 tablet (2,000 Units total) by mouth daily. 30 tablet 12  . levothyroxine (SYNTHROID, LEVOTHROID) 50 MCG tablet TAKE 1 TABLET EVERY DAY ON AN EMPTY STOMACH WITH A GLASS OF WATER AT LEAST 30-60 MINUTES BEFORE BREAKFAST 30 tablet 12  . lidocaine-prilocaine (EMLA) cream Apply to port area 30-45 mins prior to chemo. 30 g 5  . lisinopril (PRINIVIL,ZESTRIL) 10 MG tablet Take 1 tablet (10 mg total) by mouth daily. 90 tablet 4  . LORazepam (ATIVAN) 0.5 MG tablet Take 0.5 tablets (0.25 mg total) by mouth at bedtime. 30 tablet 4  . Lysine 500 MG TABS Take 1 tablet by mouth daily as needed.     . magic mouthwash w/lidocaine SOLN Take 5 mLs by mouth every 4 (four) hours as needed.  1  . Multiple Vitamin (MULTIVITAMIN) capsule Take 1 capsule by mouth daily.    . pantoprazole (PROTONIX) 40 MG tablet TAKE 1 TABLET BY MOUTH DAILY 30 tablet 11  . propafenone (RYTHMOL) 225  MG tablet Take 225 mg by mouth 2 (two) times daily.    Marland Kitchen acetaminophen (TYLENOL) 500 MG chewable tablet Chew 500 mg by mouth every 6 (six) hours as needed for pain.    . Docosanol (ABREVA EX) Apply 1 application topically as needed. Fever blisters    . guaiFENesin-dextromethorphan (ROBITUSSIN DM) 100-10 MG/5ML syrup Take 5 mLs by mouth every 4 (four) hours as needed for cough. (Patient not taking: Reported on 11/18/2017) 140 mL 0  . lactulose (CHRONULAC) 10 GM/15ML solution TAKE 15MLS (10G TOTAL) BY MOUTH THREE TIMES A DAY AS DIRECTED BY PHYSICIAN. (Patient not taking: Reported on 11/09/2017) 240 mL 1  . ondansetron (ZOFRAN) 4 MG tablet Take 1 tablet (4 mg total) by mouth every 8 (eight) hours as needed for nausea or vomiting. (Patient not taking: Reported on 11/18/2017) 30 tablet 3  . polyethylene  glycol powder (GLYCOLAX/MIRALAX) powder TAKE 17GM (LINE INSIDE CAP) IN EIGHT OUNCES OF WATER OR OTHER LIQUID DAILY (Patient not taking: Reported on 11/18/2017) 255 g 2  . prochlorperazine (COMPAZINE) 10 MG tablet Take 1 tablet (10 mg total) by mouth every 6 (six) hours as needed for nausea or vomiting. (Patient not taking: Reported on 11/18/2017) 30 tablet 0  . traMADol (ULTRAM) 50 MG tablet Take 1 tablet (50 mg total) by mouth every 6 (six) hours as needed. (Patient not taking: Reported on 11/09/2017) 30 tablet 0   No current facility-administered medications for this visit.    Facility-Administered Medications Ordered in Other Visits  Medication Dose Route Frequency Provider Last Rate Last Dose  . sodium chloride 0.9 % injection 10 mL  10 mL Intracatheter PRN Forest Gleason, MD   10 mL at 02/07/15 0930    PHYSICAL EXAMINATION: ECOG PERFORMANCE STATUS: 0 - Asymptomatic  BP 138/83   Pulse 73   Temp 97.9 F (36.6 C) (Tympanic)   Resp 18   Ht '5\' 4"'$  (1.626 m)   BMI 32.27 kg/m   There were no vitals filed for this visit.  GENERAL: Well-nourished well-developed; Alert, no distress and comfort. Accompanied  by her husband. EYES: no pallor or icterus OROPHARYNX: no thrush or ulceration; good dentition  NECK: supple, no masses felt LYMPH:  no palpable lymphadenopathy in the cervical, axillary or inguinal regions.  LUNGS: clear to auscultation and  No wheeze or crackles HEART/CVS: regular rate & rhythm and no murmurs; No lower extremity edema  ABDOMEN:abdomen soft, non-tender and normal bowel sounds Musculoskeletal:no cyanosis of digits and no clubbing. PSYCH: alert & oriented x 3 with fluent speech NEURO: no focal motor/sensory deficits SKIN:  Normal. No rash noted.   LABORATORY DATA:  I have reviewed the data as listed    Component Value Date/Time   NA 136 11/18/2017 1010   NA 140 12/14/2014 1037   K 4.5 11/18/2017 1010   K 4.4 12/14/2014 1037   CL 103 11/18/2017 1010   CL 106 12/14/2014 1037   CO2 23 11/18/2017 1010   CO2 26 12/14/2014 1037   GLUCOSE 81 11/18/2017 1010   GLUCOSE 87 12/14/2014 1037   BUN 16 11/18/2017 1010   BUN 12 12/14/2014 1037   CREATININE 1.05 (H) 11/18/2017 1010   CREATININE 0.97 12/14/2014 1037   CALCIUM 9.6 11/18/2017 1010   CALCIUM 9.2 12/14/2014 1037   PROT 6.8 11/18/2017 1010   PROT 7.3 12/14/2014 1037   ALBUMIN 3.6 11/18/2017 1010   ALBUMIN 3.8 12/14/2014 1037   AST 25 11/18/2017 1010   AST 20 12/14/2014 1037   ALT 16 11/18/2017 1010   ALT 11 (L) 12/14/2014 1037   ALKPHOS 64 11/18/2017 1010   ALKPHOS 138 (H) 12/14/2014 1037   BILITOT 0.6 11/18/2017 1010   BILITOT 0.5 12/14/2014 1037   GFRNONAA 51 (L) 11/18/2017 1010   GFRNONAA 59 (L) 12/14/2014 1037   GFRAA 59 (L) 11/18/2017 1010   GFRAA >60 12/14/2014 1037    No results found for: SPEP, UPEP  Lab Results  Component Value Date   WBC 5.7 11/18/2017   NEUTROABS 1.9 11/18/2017   HGB 14.5 11/18/2017   HCT 42.6 11/18/2017   MCV 95.4 11/18/2017   PLT 248 11/18/2017      Chemistry      Component Value Date/Time   NA 136 11/18/2017 1010   NA 140 12/14/2014 1037   K 4.5 11/18/2017  1010   K 4.4 12/14/2014 1037  CL 103 11/18/2017 1010   CL 106 12/14/2014 1037   CO2 23 11/18/2017 1010   CO2 26 12/14/2014 1037   BUN 16 11/18/2017 1010   BUN 12 12/14/2014 1037   CREATININE 1.05 (H) 11/18/2017 1010   CREATININE 0.97 12/14/2014 1037   GLU 84 03/16/2014      Component Value Date/Time   CALCIUM 9.6 11/18/2017 1010   CALCIUM 9.2 12/14/2014 1037   ALKPHOS 64 11/18/2017 1010   ALKPHOS 138 (H) 12/14/2014 1037   AST 25 11/18/2017 1010   AST 20 12/14/2014 1037   ALT 16 11/18/2017 1010   ALT 11 (L) 12/14/2014 1037   BILITOT 0.6 11/18/2017 1010   BILITOT 0.5 12/14/2014 1037     IMPRESSION: 1. There is apparent increase soft tissue thickening around the cecal base which appears new from previous exam. Although nonspecific and can occur for variety of reasons including incomplete distention, serosal deposits reflecting peritoneal disease cannot be excluded. Consider further evaluation with PET-CT. 2. New asymmetric soft tissue thickening along the left side of vaginal cuff. 3. Aortic atherosclerosis   Electronically Signed   By: Kerby Moors M.D.   On: 10/23/2017 10:56   Results for SRAH, AKE (MRN [id number]) as of 11/18/2017 10:35  Ref. Range 06/24/2017 09:00 08/21/2017 10:58 09/11/2017 08:22 10/07/2017 09:56 10/28/2017 10:31  Cancer Antigen (CA) 125 Latest Ref Range: 0.0 - 38.1 U/mL 13.9 19.6 22.4 25.5 32.4   RADIOGRAPHIC STUDIES: I have personally reviewed the radiological images as listed and agreed with the findings in the report. No results found.   ASSESSMENT & PLAN:  Ovarian cancer, unspecified laterality ([place]) # RECURRENT PLATINUM REFRACTORY HIGH GRADE SEROUS ovarian cancer [dec 2017] - on Avastin maintenance; march 8th CT/PET- Progression; with possible cecal implants; increasing vaginal cuff thickness.  # Given the progression noted on the imaging; recommend changing to alternative treatment options- Gemcitabine [ discussed potential  SEs-including but not limited to skin rash/fevers.] Vs. PARP inhibitors [pt is positive for somatic ATM; neg BRCA/HRD ]   # Pt declines- clinical trial evaluation [phase I/insurance] foundation 1 Vs Omniseq- after checking with insurance/financial coverage.   # Acute bronchitis- s/p augumentin- improved  # Elevated Blood pressure-improved; continue lisinopril '10mg'$ /day. New script given.    # check ca-125 today; PET scan asap; de-access the port; [facility] One- will check with insurance.   # follow up to be decided/ based after discussion with gyn-Onc/Dr.Secord.    # I reviewed the blood work- with the patient in detail; also reviewed the imaging independently [as summarized above]; and with the patient in detail.    No orders of the defined types were placed in this encounter.    Cammie Sickle, MD 11/19/2017 8:23 AM

## 2017-11-19 LAB — CA 125: Cancer Antigen (CA) 125: 39.4 U/mL — ABNORMAL HIGH (ref 0.0–38.1)

## 2017-11-20 ENCOUNTER — Telehealth: Payer: Self-pay | Admitting: Internal Medicine

## 2017-11-20 MED ORDER — NIRAPARIB TOSYLATE 100 MG PO CAPS: 300.0000 mg | ORAL_CAPSULE | Freq: Every day | ORAL | 6 refills | Status: DC

## 2017-11-20 NOTE — Telephone Encounter (Signed)
Spoke to patient regarding niraparib; recommend starting at 300 mg once a day dose.  Scription ordered. Also discussed with Dr.Secord.   Alysson-feel free to contact patient regarding the prescription.  Please have pt follow up with me next wed/thursday [10th or 11th april]; no labs needed.

## 2017-11-20 NOTE — Telephone Encounter (Signed)
Spoke to [facility]; will get started on [employer].   Ordered omniseq/ spoke to [employer].

## 2017-11-20 NOTE — Telephone Encounter (Signed)
Faxed to [facility] 

## 2017-11-21 ENCOUNTER — Telehealth: Payer: Self-pay | Admitting: Pharmacy Technician

## 2017-11-21 NOTE — Telephone Encounter (Signed)
Oral Oncology Patient Advocate Encounter  Received notification from [facility] Medicare that prior authorization for Zejula is required.  PA submitted on CoverMyMeds Key [id number] Status is pending  Oral Oncology Clinic will continue to follow.  Fabio Asa. Melynda Keller, [place] Patient [place] [phone] 11/21/2017 9:48 AM

## 2017-11-22 DIAGNOSIS — C7911 Secondary malignant neoplasm of bladder: Secondary | ICD-10-CM | POA: Diagnosis not present

## 2017-11-22 DIAGNOSIS — C786 Secondary malignant neoplasm of retroperitoneum and peritoneum: Secondary | ICD-10-CM | POA: Diagnosis not present

## 2017-11-22 DIAGNOSIS — C574 Malignant neoplasm of uterine adnexa, unspecified: Secondary | ICD-10-CM | POA: Diagnosis not present

## 2017-11-22 DIAGNOSIS — C785 Secondary malignant neoplasm of large intestine and rectum: Secondary | ICD-10-CM | POA: Diagnosis not present

## 2017-11-22 DIAGNOSIS — C7989 Secondary malignant neoplasm of other specified sites: Secondary | ICD-10-CM | POA: Diagnosis not present

## 2017-11-22 DIAGNOSIS — N83292 Other ovarian cyst, left side: Secondary | ICD-10-CM | POA: Diagnosis not present

## 2017-11-22 DIAGNOSIS — N83202 Unspecified ovarian cyst, left side: Secondary | ICD-10-CM | POA: Diagnosis not present

## 2017-11-22 DIAGNOSIS — C679 Malignant neoplasm of bladder, unspecified: Secondary | ICD-10-CM | POA: Diagnosis not present

## 2017-11-22 DIAGNOSIS — C569 Malignant neoplasm of unspecified ovary: Secondary | ICD-10-CM | POA: Diagnosis not present

## 2017-11-22 DIAGNOSIS — C19 Malignant neoplasm of rectosigmoid junction: Secondary | ICD-10-CM | POA: Diagnosis not present

## 2017-11-22 DIAGNOSIS — C763 Malignant neoplasm of pelvis: Secondary | ICD-10-CM | POA: Diagnosis not present

## 2017-11-22 DIAGNOSIS — N9489 Other specified conditions associated with female genital organs and menstrual cycle: Secondary | ICD-10-CM | POA: Diagnosis not present

## 2017-11-22 DIAGNOSIS — C561 Malignant neoplasm of right ovary: Secondary | ICD-10-CM | POA: Diagnosis not present

## 2017-11-22 NOTE — Telephone Encounter (Signed)
Oral Oncology Patient Advocate Encounter  Prior Authorization for Noel Journey has been approved.    Effective dates: 11/21/2017 through 11/21/2018  Oral Oncology Clinic will continue to follow.   Fabio Asa. Melynda Keller, [place] Patient [place] [phone] 11/22/2017 3:49 PM

## 2017-11-25 ENCOUNTER — Telehealth: Payer: Self-pay | Admitting: Pharmacist

## 2017-11-25 DIAGNOSIS — E069 Thyroiditis, unspecified: Secondary | ICD-10-CM | POA: Diagnosis not present

## 2017-11-25 DIAGNOSIS — C569 Malignant neoplasm of unspecified ovary: Secondary | ICD-10-CM

## 2017-11-25 NOTE — Telephone Encounter (Signed)
Oral Oncology Pharmacist Encounter  Received new prescription for Zejula (niraparib) for the treatment of ovarian cancer, planned duration until disease progression or unacceptable drug toxicity.  CBC/BP from 11/18/17 assessed, no relevant lab abnormalities and BP controlled. Prescription dose and frequency assessed. Patient's plt ar >150 and weight is > 77kg, no baseline dose adjustment needed  Current medication list in Epic reviewed, no DDIs with Zejula identified.  Prescription has been e-scribed to the [facility] for benefits analysis and approval.  Oral Oncology Clinic will continue to follow for insurance authorization, copayment issues, initial counseling and start date.  Darl Pikes, PharmD, BCPS Hematology/Oncology Clinical Pharmacist [facility] [place] [phone]  11/27/2017 1:51 PM

## 2017-11-26 ENCOUNTER — Encounter: Payer: Self-pay | Admitting: Family Medicine

## 2017-11-26 LAB — T4 AND TSH
T4, Total: 6.1 ug/dL (ref 4.5–12.0)
TSH: 4.33 u[IU]/mL (ref 0.450–4.500)

## 2017-11-27 ENCOUNTER — Inpatient Hospital Stay: Payer: PPO | Admitting: Internal Medicine

## 2017-11-27 ENCOUNTER — Other Ambulatory Visit: Payer: Self-pay

## 2017-11-27 VITALS — BP 137/85 | HR 74 | Temp 97.9°F | Resp 20 | Ht 64.0 in | Wt 187.0 lb

## 2017-11-27 DIAGNOSIS — Z9221 Personal history of antineoplastic chemotherapy: Secondary | ICD-10-CM | POA: Diagnosis not present

## 2017-11-27 DIAGNOSIS — J329 Chronic sinusitis, unspecified: Secondary | ICD-10-CM

## 2017-11-27 DIAGNOSIS — Z923 Personal history of irradiation: Secondary | ICD-10-CM | POA: Diagnosis not present

## 2017-11-27 DIAGNOSIS — Z800 Family history of malignant neoplasm of digestive organs: Secondary | ICD-10-CM

## 2017-11-27 DIAGNOSIS — Z803 Family history of malignant neoplasm of breast: Secondary | ICD-10-CM

## 2017-11-27 DIAGNOSIS — Z853 Personal history of malignant neoplasm of breast: Secondary | ICD-10-CM

## 2017-11-27 DIAGNOSIS — I10 Essential (primary) hypertension: Secondary | ICD-10-CM | POA: Diagnosis not present

## 2017-11-27 DIAGNOSIS — K219 Gastro-esophageal reflux disease without esophagitis: Secondary | ICD-10-CM | POA: Diagnosis not present

## 2017-11-27 DIAGNOSIS — I700 Atherosclerosis of aorta: Secondary | ICD-10-CM

## 2017-11-27 DIAGNOSIS — I4891 Unspecified atrial fibrillation: Secondary | ICD-10-CM

## 2017-11-27 DIAGNOSIS — E785 Hyperlipidemia, unspecified: Secondary | ICD-10-CM | POA: Diagnosis not present

## 2017-11-27 DIAGNOSIS — C569 Malignant neoplasm of unspecified ovary: Secondary | ICD-10-CM

## 2017-11-27 DIAGNOSIS — Z79899 Other long term (current) drug therapy: Secondary | ICD-10-CM

## 2017-11-27 DIAGNOSIS — C786 Secondary malignant neoplasm of retroperitoneum and peritoneum: Secondary | ICD-10-CM

## 2017-11-27 DIAGNOSIS — E039 Hypothyroidism, unspecified: Secondary | ICD-10-CM | POA: Diagnosis not present

## 2017-11-27 DIAGNOSIS — Z9223 Personal history of estrogen therapy: Secondary | ICD-10-CM

## 2017-11-27 MED ORDER — NIRAPARIB TOSYLATE 100 MG PO CAPS: 300.0000 mg | ORAL_CAPSULE | Freq: Every day | ORAL | 6 refills | Status: DC

## 2017-11-27 MED ORDER — ONDANSETRON HCL 4 MG PO TABS: ORAL_TABLET | ORAL | 3 refills | Status: AC

## 2017-11-27 MED FILL — ZEJULA 100 MG CAPS: 100 | 30 days supply | Qty: 90 | Fill #0

## 2017-11-27 NOTE — Assessment & Plan Note (Addendum)
#  RECURRENT PLATINUM REFRACTORY HIGH GRADE SEROUS ovarian cancer [dec 2017] - on Avastin maintenance; march 8th CT/PET- Progression; with possible cecal implants; increasing vaginal cuff thickness.CA-125-39.   # Given the progression noted on the imaging I would recommend discontinuation of; Avastin.  # discussed re: use PARP inhibitors [pt is positive for somatic ATM; neg BRCA/HRD]-discussed regarding gemcitabine versus PRP inhibitor.  I think it is reasonable to start zejula 300 mg/day [weight-84]; discussed the potential risk for thrombocytopenia/renal dysfunction;  20% risk of hypertension.  Understands treatments are palliative.  Discussed with Dr.Secord.  # Chronic sinusitis-recommend flonase/ pt seen Dr.Vaught.   # Elevated Blood pressure-improved; continue lisinopril '10mg'$ /day. STABLE.   # Molecular testing/NGS- pending.   # port flush today; 2 weeks/ca-125/cbc/cbmp. Discussed with Oral chemo pharmacist.

## 2017-11-27 NOTE — Progress Notes (Signed)
Patient here for follow-up for ovarian cancer. Patient here to discuss plan of care. She has multiple questions regarding Zejula

## 2017-11-27 NOTE — Telephone Encounter (Signed)
Oral Chemotherapy Pharmacist Encounter  Successfully enrolled patient for copayment assistance funds from [facility] from the Ovarian Cancer fund. Award amount: $3,500 Effective dates: 10/28/17 - 10/28/18 ID: [id number] BIN: [id number] Group: [id number] PCN: [id number]  I will place a copy of the award letter to be scanned into patient's chart. Billing information will be shared with [place].   Darl Pikes, PharmD, BCPS Hematology/Oncology Clinical Pharmacist [facility] [place] [phone]  11/27/2017 2:12 PM

## 2017-11-27 NOTE — Progress Notes (Signed)
[place] OFFICE PROGRESS NOTE  Patient Care Team: Jerrol Banana., MD as PCP - General (Family Medicine) Gillis Ends, MD as Referring Physician (Obstetrics and Gynecology) Bary Castilla Forest Gleason, MD as Consulting Physician (General Surgery)  Ovarian cancer [facility]) patient   Staging form: Ovary, AJCC 7th Edition     Clinical: Stage IIIC (T3c, N0, M0) - Unsigned   Oncology History   # April- MAY 2016- HIGH GRADE SEROUS OVARIAN CANCER STAGE IIIC; CA-125 +2300+;  s/p OPTIMAL DEBULKING SURGERY   # MAY 2016[name] [finished in Sep 19th 2017]  # MAY CT 2017- RECURRENT/Peritoneal carcinomatosis/pelvic implant ~1.2cm/Ca-125-34;   # July 3rd 2017- CARBO-DOXIL q 4W [with neulasta]; CT May 11 2016- slight increase peritoneal / stable nodules. Cont chemo [finished DEC 2017]. DEC 28th 2017 CT- Increase in peritoneal deposits; increasing Ca 125 [60s];  # Jan 2018- carbo-Taxol-Avastin x6 cycles; Aug 2018- CR; Aug 2018- start Avastin Maintenance; MARCH 2019- PROGRESSION- STOP avastin.   # MID April 2019- Zejula [300 mg/day]  # G-1-2 hand foot syndrome-   # LEFT BREAST CA s/p Lumpec & RT s/p TAM   # Afib [cardiology]; JUNE 2017- MUGA 62%  Jan 2018- MOLECULAR TESTING- ATM DELETERIOUS HETEROZYGOUS mutation; TUMOR BRCA- NEG; Myriad genomic instability- NEGATIVE;     Malignant neoplasm of ovary (HCC)    Ovarian cancer, unspecified laterality [facility])    INTERVAL HISTORY:  77 year old female patient with above history of ovarian cancer recurrent Platinum Refractory ovarian cancer [dec 2017]-recently progressed on Avastin maintenance based on a PET scan in March 2019 is here for follow-up.  Patient continues to complain of sinus congestion nasal congestion-which is not improved on antihistamines.  Continues to complain of sinus pressure.  Foul-smelling discharge improved status post antibiotics.   No abdominal pain.  No constipation.  No swelling in the legs.   Denies any headaches.  Denies any nausea vomiting.  Patient concerned about the potential side effects of the PARP inhibitors.  REVIEW OF SYSTEMS:  A complete 10 point review of system is done which is negative except mentioned above/history of present illness.   PAST MEDICAL HISTORY :  Past Medical History:  Diagnosis Date  . Atrial fibrillation ([place])   . Breast cancer ([place]) 1998  . Cancer of breast ([place]) 1998   Left/radiation  . CINV (chemotherapy-induced nausea and vomiting)   . GERD (gastroesophageal reflux disease)   . Hyperlipidemia   . Hypothyroidism   . Mitral insufficiency   . Ovarian cancer ([place])    s/p BSO optimal tumor debulking May 2016/chemo  . Personal history of chemotherapy since 2016   ovarian cancer  . Personal history of radiation therapy 1998  . PVC (premature ventricular contraction)     PAST SURGICAL HISTORY :   Past Surgical History:  Procedure Laterality Date  . ABDOMINAL HYSTERECTOMY    . BILATERAL SALPINGOOPHORECTOMY  May 2016   with optimal tumor debulking   . BREAST LUMPECTOMY Left 1998  . BREAST SURGERY     Left  . CESAREAN SECTION     x2  . CHOLECYSTECTOMY    . COLONOSCOPY  2006  . DEBULKING N/A 12/22/2014   Procedure: DEBULKING;  Surgeon: Gillis Ends, MD;  Location: [facility] ORS;  Service: Gynecology;  Laterality: N/A;  . LAPAROTOMY N/A 12/22/2014   Procedure: EXPLORATORY LAPAROTOMY;  Surgeon: Robert Bellow, MD;  Location: [facility] ORS;  Service: General;  Laterality: N/A;  . LAPAROTOMY WITH STAGING N/A 12/22/2014   Procedure:  LAPAROTOMY WITH STAGING;  Surgeon: Gillis Ends, MD;  Location: [facility] ORS;  Service: Gynecology;  Laterality: N/A;  . PERIPHERAL VASCULAR CATHETERIZATION Left 12/22/2014   Procedure: PORTA CATH INSERTION;  Surgeon: Robert Bellow, MD;  Location: [facility] ORS;  Service: General;  Laterality: Left;  . PORTACATH PLACEMENT Right 12/22/2014   Procedure: INSERTION PORT-A-CATH;  Surgeon: Robert Bellow, MD;   Location: [facility] ORS;  Service: General;  Laterality: Right;  . SALPINGOOPHORECTOMY Bilateral 12/22/2014   Procedure: SALPINGO OOPHORECTOMY;  Surgeon: Gillis Ends, MD;  Location: [facility] ORS;  Service: Gynecology;  Laterality: Bilateral;  . UPPER GI ENDOSCOPY  09/25/04   hiatus hernia, schatzki ring and a single gastric polyp    FAMILY HISTORY :   Family History  Problem Relation Age of Onset  . Cancer Mother        breast, throat, and stomach  . Breast cancer Mother   . Heart disease Father   . Pulmonary embolism Sister   . Cancer Brother 60       angiosarcoma of the chest; carcinoid small intestinal tumor  . Hypertension Brother   . Cancer Maternal Aunt        breast cancer  . Cancer Maternal Grandfather 43       pancreatic    SOCIAL HISTORY:   Social History   Tobacco Use  . Smoking status: Never Smoker  . Smokeless tobacco: Never Used  Substance Use Topics  . Alcohol use: No  . Drug use: No    ALLERGIES:  is allergic to carafate [sucralfate] and sulfa antibiotics.  MEDICATIONS:  Current Outpatient Medications  Medication Sig Dispense Refill  . Cholecalciferol (VITAMIN D) 2000 units tablet Take 1 tablet (2,000 Units total) by mouth daily. 30 tablet 12  . levothyroxine (SYNTHROID, LEVOTHROID) 50 MCG tablet TAKE 1 TABLET EVERY DAY ON AN EMPTY STOMACH WITH A GLASS OF WATER AT LEAST 30-60 MINUTES BEFORE BREAKFAST 30 tablet 12  . lidocaine-prilocaine (EMLA) cream Apply to port area 30-45 mins prior to chemo. 30 g 5  . lisinopril (PRINIVIL,ZESTRIL) 10 MG tablet Take 1 tablet (10 mg total) by mouth daily. 90 tablet 4  . LORazepam (ATIVAN) 0.5 MG tablet Take 0.5 tablets (0.25 mg total) by mouth at bedtime. 30 tablet 4  . Lysine 500 MG TABS Take 1 tablet by mouth daily as needed.     . magic mouthwash w/lidocaine SOLN Take 5 mLs by mouth every 4 (four) hours as needed.  1  . Multiple Vitamin (MULTIVITAMIN) capsule Take 1 capsule by mouth daily.    . propafenone (RYTHMOL)  225 MG tablet Take 225 mg by mouth 2 (two) times daily.    Marland Kitchen acetaminophen (TYLENOL) 500 MG chewable tablet Chew 500 mg by mouth every 6 (six) hours as needed for pain.    . Docosanol (ABREVA EX) Apply 1 application topically as needed. Fever blisters    . lactulose (CHRONULAC) 10 GM/15ML solution TAKE 15MLS (10G TOTAL) BY MOUTH THREE TIMES A DAY AS DIRECTED BY PHYSICIAN. (Patient not taking: Reported on 11/09/2017) 240 mL 1  . Niraparib Tosylate 100 MG CAPS Take 300 mg by mouth daily. Take 3 pills at same time of the day. Take a zofran 30 mins prior to avoid nausea. (Patient not taking: Reported on 11/27/2017) 90 capsule 6  . ondansetron (ZOFRAN) 4 MG tablet One pill 30-60 mins prior to taking zejula to prevent nausea/vomitting. 45 tablet 3  . pantoprazole (PROTONIX) 40 MG tablet TAKE 1 TABLET BY MOUTH  DAILY (Patient not taking: Reported on 11/27/2017) 30 tablet 11  . polyethylene glycol powder (GLYCOLAX/MIRALAX) powder TAKE 17GM (LINE INSIDE CAP) IN EIGHT OUNCES OF WATER OR OTHER LIQUID DAILY (Patient not taking: Reported on 11/18/2017) 255 g 2  . prochlorperazine (COMPAZINE) 10 MG tablet Take 1 tablet (10 mg total) by mouth every 6 (six) hours as needed for nausea or vomiting. (Patient not taking: Reported on 11/18/2017) 30 tablet 0  . traMADol (ULTRAM) 50 MG tablet Take 1 tablet (50 mg total) by mouth every 6 (six) hours as needed. (Patient not taking: Reported on 11/09/2017) 30 tablet 0   No current facility-administered medications for this visit.    Facility-Administered Medications Ordered in Other Visits  Medication Dose Route Frequency Provider Last Rate Last Dose  . sodium chloride 0.9 % injection 10 mL  10 mL Intracatheter PRN Forest Gleason, MD   10 mL at 02/07/15 0930    PHYSICAL EXAMINATION: ECOG PERFORMANCE STATUS: 0 - Asymptomatic  BP 137/85 (BP Location: Left Arm, Patient Position: Sitting)   Pulse 74   Temp 97.9 F (36.6 C) (Tympanic)   Resp 20   Ht _0  (1.626 m)   Wt 187 lb  (84.8 kg)   BMI 32.10 kg/m   Filed Weights   11/27/17 1006  Weight: 187 lb (84.8 kg)    GENERAL: Well-nourished well-developed; Alert, no distress and comfort. Accompanied by her husband. EYES: no pallor or icterus OROPHARYNX: no thrush or ulceration; good dentition  NECK: supple, no masses felt LYMPH:  no palpable lymphadenopathy in the cervical, axillary or inguinal regions.  LUNGS: clear to auscultation and  No wheeze or crackles HEART/CVS: regular rate & rhythm and no murmurs; No lower extremity edema  ABDOMEN:abdomen soft, non-tender and normal bowel sounds Musculoskeletal:no cyanosis of digits and no clubbing. PSYCH: alert & oriented x 3 with fluent speech NEURO: no focal motor/sensory deficits SKIN:  Normal. No rash noted.   LABORATORY DATA:  I have reviewed the data as listed    Component Value Date/Time   NA 136 11/18/2017 1010   NA 140 12/14/2014 1037   K 4.5 11/18/2017 1010   K 4.4 12/14/2014 1037   CL 103 11/18/2017 1010   CL 106 12/14/2014 1037   CO2 23 11/18/2017 1010   CO2 26 12/14/2014 1037   GLUCOSE 81 11/18/2017 1010   GLUCOSE 87 12/14/2014 1037   BUN 16 11/18/2017 1010   BUN 12 12/14/2014 1037   CREATININE 1.05 (H) 11/18/2017 1010   CREATININE 0.97 12/14/2014 1037   CALCIUM 9.6 11/18/2017 1010   CALCIUM 9.2 12/14/2014 1037   PROT 6.8 11/18/2017 1010   PROT 7.3 12/14/2014 1037   ALBUMIN 3.6 11/18/2017 1010   ALBUMIN 3.8 12/14/2014 1037   AST 25 11/18/2017 1010   AST 20 12/14/2014 1037   ALT 16 11/18/2017 1010   ALT 11 (L) 12/14/2014 1037   ALKPHOS 64 11/18/2017 1010   ALKPHOS 138 (H) 12/14/2014 1037   BILITOT 0.6 11/18/2017 1010   BILITOT 0.5 12/14/2014 1037   GFRNONAA 51 (L) 11/18/2017 1010   GFRNONAA 59 (L) 12/14/2014 1037   GFRAA 59 (L) 11/18/2017 1010   GFRAA >60 12/14/2014 1037    No results found for: SPEP, UPEP  Lab Results  Component Value Date   WBC 5.7 11/18/2017   NEUTROABS 1.9 11/18/2017   HGB 14.5 11/18/2017   HCT 42.6  11/18/2017   MCV 95.4 11/18/2017   PLT 248 11/18/2017      Chemistry  Component Value Date/Time   NA 136 11/18/2017 1010   NA 140 12/14/2014 1037   K 4.5 11/18/2017 1010   K 4.4 12/14/2014 1037   CL 103 11/18/2017 1010   CL 106 12/14/2014 1037   CO2 23 11/18/2017 1010   CO2 26 12/14/2014 1037   BUN 16 11/18/2017 1010   BUN 12 12/14/2014 1037   CREATININE 1.05 (H) 11/18/2017 1010   CREATININE 0.97 12/14/2014 1037   GLU 84 03/16/2014      Component Value Date/Time   CALCIUM 9.6 11/18/2017 1010   CALCIUM 9.2 12/14/2014 1037   ALKPHOS 64 11/18/2017 1010   ALKPHOS 138 (H) 12/14/2014 1037   AST 25 11/18/2017 1010   AST 20 12/14/2014 1037   ALT 16 11/18/2017 1010   ALT 11 (L) 12/14/2014 1037   BILITOT 0.6 11/18/2017 1010   BILITOT 0.5 12/14/2014 1037     IMPRESSION: 1. There is apparent increase soft tissue thickening around the cecal base which appears new from previous exam. Although nonspecific and can occur for variety of reasons including incomplete distention, serosal deposits reflecting peritoneal disease cannot be excluded. Consider further evaluation with PET-CT. 2. New asymmetric soft tissue thickening along the left side of vaginal cuff. 3. Aortic atherosclerosis   Electronically Signed   By: Kerby Moors M.D.   On: 10/23/2017 10:56  Results for ZADA, HASER (MRN [id number]) as of 11/27/2017 11:48  Ref. Range 04/01/2017 08:40 05/13/2017 09:10 06/24/2017 09:00 08/21/2017 10:58 09/11/2017 08:22 10/07/2017 09:56 10/28/2017 10:31 11/18/2017 10:10  Cancer Antigen (CA) 125 Latest Ref Range: 0.0 - 38.1 U/mL 14.9 14.3 13.9 19.6 22.4 25.5 32.4 39.4 (H)     RADIOGRAPHIC STUDIES: I have personally reviewed the radiological images as listed and agreed with the findings in the report. No results found.   ASSESSMENT & PLAN:  Ovarian cancer, unspecified laterality ([place]) # RECURRENT PLATINUM REFRACTORY HIGH GRADE SEROUS ovarian cancer [dec 2017] - on Avastin  maintenance; march 8th CT/PET- Progression; with possible cecal implants; increasing vaginal cuff thickness.CA-125-39.   # Given the progression noted on the imaging I would recommend discontinuation of; Avastin.  # discussed re: use PARP inhibitors [pt is positive for somatic ATM; neg BRCA/HRD]-discussed regarding gemcitabine versus PRP inhibitor.  I think it is reasonable to start zejula 300 mg/day [weight-84]; discussed the potential risk for thrombocytopenia/renal dysfunction;  20% risk of hypertension.  Understands treatments are palliative.  Discussed with Dr.Secord.  # Chronic sinusitis-recommend flonase/ pt seen Dr.Vaught.   # Elevated Blood pressure-improved; continue lisinopril 83m/day. STABLE.   # port flush today; 2 weeks/ca-125/cbc/cbmp. Discussed with Oral chemo pharmacist.    Orders Placed This Encounter  Procedures  . CBC with Differential/Platelet    Standing Status:   Future    Standing Expiration Date:   11/28/2018  . Comprehensive metabolic panel    Standing Status:   Future    Standing Expiration Date:   11/28/2018  . CA 125    Standing Status:   Future    Standing Expiration Date:   11/27/2018  . CBC with Differential/Platelet    Standing Status:   Future    Standing Expiration Date:   11/28/2018  . Comprehensive metabolic panel    Standing Status:   Future    Standing Expiration Date:   11/28/2018  . CA 125    Standing Status:   Future    Standing Expiration Date:   11/27/2018     GCammie Sickle MD 11/27/2017 11:53 AM

## 2017-11-27 NOTE — Telephone Encounter (Signed)
Oral Chemotherapy Pharmacist Encounter  Patient Education I spoke with patient and her husband for overview of new oral chemotherapy medication: Zejula (niraparib) for the treatment of ovarian cancer, planned duration until disease progression or unacceptable drug toxicity.   Counseled patient on administration, dosing, side effects, monitoring, drug-food interactions, safe handling, storage, and disposal. Patient will take 300 mg by mouth daily. Take 3 pills at same time of the day. Take a zofran 30 mins prior to avoid nausea.  Side effects include but not limited to: N/V, decreased hgb/plt/WBC, fatigue, constipation .    Reviewed with patient importance of keeping a medication schedule and plan for any missed doses.  Jamie Conner voiced understanding and appreciation. All questions answered. Medication handout provided.   Provided patient with [facility] phone number. Patient knows to call the office with questions or concerns. Oral Chemotherapy [facility] will continue to follow.  Darl Pikes, PharmD, BCPS Hematology/Oncology Clinical Pharmacist [facility] [phone]  11/27/2017 2:12 PM

## 2017-11-29 NOTE — Telephone Encounter (Signed)
Oral Chemotherapy Pharmacist Encounter  Successfully enrolled patient for copayment assistance funds from [employer] from the Ovarian fund. Award amount: $5,000 Effective dates: 11/29/2017 - 11/30/2018  ID: [id number]  Patient Responsibility: $0.00  Rx BIN: [id number]  Rx PCN: [id number]  Group #: [place] information has been shared with [place]. I will place a copy of the award letter to be scanned into patient's chart.  Darl Pikes, PharmD, BCPS Hematology/Oncology Clinical Pharmacist [facility] [phone]  11/29/2017 12:10 PM

## 2017-12-03 ENCOUNTER — Telehealth: Payer: Self-pay

## 2017-12-03 ENCOUNTER — Telehealth: Payer: Self-pay | Admitting: *Deleted

## 2017-12-03 ENCOUNTER — Telehealth: Payer: Self-pay | Admitting: Pharmacist

## 2017-12-03 NOTE — Telephone Encounter (Signed)
-----   Message from [place] sent at 12/03/2017  1:48 PM EDT ----- Tommie Raymond from [facility] wants you to call he needs some additional information about the cancer diag and a few other things [phone].

## 2017-12-03 NOTE — Telephone Encounter (Signed)
Called pt to r/s AWV and pt states she is currently in a restaurant and unable to schedule. Pt states she will CB this afternoon.  -MM

## 2017-12-03 NOTE — Telephone Encounter (Signed)
Oral Chemotherapy Pharmacist Encounter  Successfully enrolled patient for copayment assistance funds from [facility] [place] (PA[facility]) from the Ovarian Cancer fund. Award amount: $4,500 Effective dates: 06/06/2017 - 12/04/18 Cardholder Number:[id number] BIN: [id number] PCN: [id number] Group: [id number]  Billing information will be shared with [place]. I will place a copy of the award letter to be scanned into patient's chart.  Darl Pikes, PharmD, BCPS Hematology/Oncology Clinical Pharmacist [facility] [phone]  12/03/2017 3:40 PM

## 2017-12-03 NOTE — Telephone Encounter (Signed)
returned phone call to Efraim Kaufmann for [facility] [phone].  He needed to clarify patient's family hx of cancer to approve the [facility] testing.  Information was provided. Claim was approved per Tommie Raymond after this information was obtained.

## 2017-12-06 NOTE — Telephone Encounter (Signed)
Oral Chemotherapy Pharmacist Encounter   Physician Diagnosis Verification Form received and approved by PAF.   Darl Pikes, PharmD, BCPS Hematology/Oncology Clinical Pharmacist [facility] [phone]  12/06/2017 2:02 PM

## 2017-12-09 ENCOUNTER — Encounter: Payer: Self-pay | Admitting: Internal Medicine

## 2017-12-10 ENCOUNTER — Encounter
Admission: EM | Disposition: A | Discharge status: Home / Self Care | Payer: Self-pay | Source: Home / Self Care | Attending: Internal Medicine

## 2017-12-10 ENCOUNTER — Encounter: Payer: Self-pay | Admitting: Emergency Medicine

## 2017-12-10 ENCOUNTER — Emergency Department: Payer: PPO

## 2017-12-10 ENCOUNTER — Inpatient Hospital Stay
Admission: EM | Admit: 2017-12-10 | Discharge: 2017-12-12 | DRG: 287 | Disposition: A | Discharge status: Home / Self Care | Payer: PPO | Attending: Internal Medicine | Admitting: Internal Medicine

## 2017-12-10 ENCOUNTER — Other Ambulatory Visit: Payer: Self-pay | Admitting: Internal Medicine

## 2017-12-10 ENCOUNTER — Inpatient Hospital Stay
Admit: 2017-12-10 | Discharge: 2017-12-10 | Disposition: A | Discharge status: Home / Self Care | Payer: PPO | Attending: Internal Medicine | Admitting: Internal Medicine

## 2017-12-10 ENCOUNTER — Other Ambulatory Visit: Payer: Self-pay

## 2017-12-10 DIAGNOSIS — Z923 Personal history of irradiation: Secondary | ICD-10-CM | POA: Diagnosis not present

## 2017-12-10 DIAGNOSIS — Z9221 Personal history of antineoplastic chemotherapy: Secondary | ICD-10-CM

## 2017-12-10 DIAGNOSIS — R079 Chest pain, unspecified: Secondary | ICD-10-CM | POA: Diagnosis not present

## 2017-12-10 DIAGNOSIS — R531 Weakness: Secondary | ICD-10-CM | POA: Diagnosis not present

## 2017-12-10 DIAGNOSIS — K219 Gastro-esophageal reflux disease without esophagitis: Secondary | ICD-10-CM | POA: Diagnosis present

## 2017-12-10 DIAGNOSIS — C786 Secondary malignant neoplasm of retroperitoneum and peritoneum: Secondary | ICD-10-CM | POA: Diagnosis not present

## 2017-12-10 DIAGNOSIS — I213 ST elevation (STEMI) myocardial infarction of unspecified site: Secondary | ICD-10-CM

## 2017-12-10 DIAGNOSIS — I2102 ST elevation (STEMI) myocardial infarction involving left anterior descending coronary artery: Secondary | ICD-10-CM | POA: Diagnosis not present

## 2017-12-10 DIAGNOSIS — I429 Cardiomyopathy, unspecified: Secondary | ICD-10-CM | POA: Diagnosis not present

## 2017-12-10 DIAGNOSIS — I481 Persistent atrial fibrillation: Secondary | ICD-10-CM | POA: Diagnosis not present

## 2017-12-10 DIAGNOSIS — E785 Hyperlipidemia, unspecified: Secondary | ICD-10-CM | POA: Diagnosis present

## 2017-12-10 DIAGNOSIS — I10 Essential (primary) hypertension: Secondary | ICD-10-CM | POA: Diagnosis not present

## 2017-12-10 DIAGNOSIS — Z853 Personal history of malignant neoplasm of breast: Secondary | ICD-10-CM

## 2017-12-10 DIAGNOSIS — R1013 Epigastric pain: Secondary | ICD-10-CM | POA: Diagnosis not present

## 2017-12-10 DIAGNOSIS — R112 Nausea with vomiting, unspecified: Secondary | ICD-10-CM | POA: Diagnosis not present

## 2017-12-10 DIAGNOSIS — I4891 Unspecified atrial fibrillation: Secondary | ICD-10-CM | POA: Diagnosis not present

## 2017-12-10 DIAGNOSIS — I340 Nonrheumatic mitral (valve) insufficiency: Secondary | ICD-10-CM | POA: Diagnosis not present

## 2017-12-10 DIAGNOSIS — E039 Hypothyroidism, unspecified: Secondary | ICD-10-CM | POA: Diagnosis present

## 2017-12-10 DIAGNOSIS — I219 Acute myocardial infarction, unspecified: Secondary | ICD-10-CM | POA: Diagnosis not present

## 2017-12-10 DIAGNOSIS — C569 Malignant neoplasm of unspecified ovary: Secondary | ICD-10-CM | POA: Diagnosis not present

## 2017-12-10 DIAGNOSIS — Z8543 Personal history of malignant neoplasm of ovary: Secondary | ICD-10-CM | POA: Diagnosis not present

## 2017-12-10 DIAGNOSIS — R0602 Shortness of breath: Secondary | ICD-10-CM | POA: Diagnosis not present

## 2017-12-10 DIAGNOSIS — R748 Abnormal levels of other serum enzymes: Secondary | ICD-10-CM | POA: Diagnosis not present

## 2017-12-10 DIAGNOSIS — Z79899 Other long term (current) drug therapy: Secondary | ICD-10-CM

## 2017-12-10 HISTORY — PX: LEFT HEART CATH AND CORONARY ANGIOGRAPHY: CATH118249

## 2017-12-10 LAB — LIPID PANEL
Cholesterol: 296 mg/dL — ABNORMAL HIGH (ref 0–200)
HDL: 63 mg/dL (ref >40)
LDL Cholesterol: 202 mg/dL — ABNORMAL HIGH (ref 0–99)
TRIGLYCERIDES: 156 mg/dL — AB (ref <150)
Total CHOL/HDL Ratio: 4.7 RATIO
VLDL: 31 mg/dL (ref 0–40)

## 2017-12-10 LAB — COMPREHENSIVE METABOLIC PANEL
ALK PHOS: 89 U/L (ref 38–126)
ALT: 28 U/L (ref 14–54)
ANION GAP: 9 (ref 5–15)
AST: 41 U/L (ref 15–41)
Albumin: 4.2 g/dL (ref 3.5–5.0)
BUN: 19 mg/dL (ref 6–20)
CALCIUM: 9.9 mg/dL (ref 8.9–10.3)
CO2: 26 mmol/L (ref 22–32)
CREATININE: 1.27 mg/dL — AB (ref 0.44–1.00)
Chloride: 99 mmol/L — ABNORMAL LOW (ref 101–111)
GFR calc Af Amer: 47 mL/min — ABNORMAL LOW (ref >60)
GFR, EST NON AFRICAN AMERICAN: 41 mL/min — AB (ref >60)
Glucose, Bld: 117 mg/dL — ABNORMAL HIGH (ref 65–99)
Potassium: 4.2 mmol/L (ref 3.5–5.1)
SODIUM: 134 mmol/L — AB (ref 135–145)
Total Bilirubin: 0.8 mg/dL (ref 0.3–1.2)
Total Protein: 7.7 g/dL (ref 6.5–8.1)

## 2017-12-10 LAB — CBC
HEMATOCRIT: 46.5 % (ref 35.0–47.0)
Hemoglobin: 15.6 g/dL (ref 12.0–16.0)
MCH: 32.3 pg (ref 26.0–34.0)
MCHC: 33.5 g/dL (ref 32.0–36.0)
MCV: 96.2 fL (ref 80.0–100.0)
PLATELETS: 232 10*3/uL (ref 150–440)
RBC: 4.83 MIL/uL (ref 3.80–5.20)
RDW: 13.6 % (ref 11.5–14.5)
WBC: 9 10*3/uL (ref 3.6–11.0)

## 2017-12-10 LAB — TROPONIN I
TROPONIN I: 0.09 ng/mL — AB (ref <0.03)
Troponin I: 1.48 ng/mL (ref <0.03)
Troponin I: 4 ng/mL (ref <0.03)

## 2017-12-10 LAB — CBC WITH DIFFERENTIAL/PLATELET
Basophils Absolute: 0.1 10*3/uL (ref 0–0.1)
Basophils Relative: 1 %
EOS ABS: 0.2 10*3/uL (ref 0–0.7)
Eosinophils Relative: 2 %
HCT: 46.9 % (ref 35.0–47.0)
Hemoglobin: 16.1 g/dL — ABNORMAL HIGH (ref 12.0–16.0)
LYMPHS ABS: 5.1 10*3/uL — AB (ref 1.0–3.6)
Lymphocytes Relative: 52 %
MCH: 32.5 pg (ref 26.0–34.0)
MCHC: 34.3 g/dL (ref 32.0–36.0)
MCV: 94.9 fL (ref 80.0–100.0)
Monocytes Absolute: 1.1 10*3/uL — ABNORMAL HIGH (ref 0.2–0.9)
Monocytes Relative: 11 %
NEUTROS PCT: 34 %
Neutro Abs: 3.3 10*3/uL (ref 1.4–6.5)
PLATELETS: 277 10*3/uL (ref 150–440)
RBC: 4.95 MIL/uL (ref 3.80–5.20)
RDW: 13.6 % (ref 11.5–14.5)
WBC: 9.8 10*3/uL (ref 3.6–11.0)

## 2017-12-10 LAB — GLUCOSE, CAPILLARY: GLUCOSE-CAPILLARY: 120 mg/dL — AB (ref 65–99)

## 2017-12-10 LAB — CREATININE, SERUM
Creatinine, Ser: 1.11 mg/dL — ABNORMAL HIGH (ref 0.44–1.00)
GFR calc Af Amer: 55 mL/min — ABNORMAL LOW (ref >60)
GFR calc non Af Amer: 48 mL/min — ABNORMAL LOW (ref >60)

## 2017-12-10 LAB — FIBRIN DERIVATIVES D-DIMER (ARMC ONLY): Fibrin derivatives D-dimer (ARMC): 599.24 ng/mL (FEU) — ABNORMAL HIGH (ref 0.00–499.00)

## 2017-12-10 LAB — PROTIME-INR
INR: 0.87
PROTHROMBIN TIME: 11.7 s (ref 11.4–15.2)

## 2017-12-10 LAB — APTT: APTT: 28 s (ref 24–36)

## 2017-12-10 LAB — MRSA PCR SCREENING: MRSA by PCR: NEGATIVE

## 2017-12-10 SURGERY — LEFT HEART CATH AND CORONARY ANGIOGRAPHY
Anesthesia: Moderate Sedation

## 2017-12-10 MED ORDER — ONDANSETRON HCL 4 MG/2ML IJ SOLN
INTRAMUSCULAR | Status: AC
  Filled 2017-12-10: qty 2

## 2017-12-10 MED ORDER — SODIUM CHLORIDE 0.9 % IV SOLN: INTRAVENOUS | Status: DC

## 2017-12-10 MED ORDER — NITROGLYCERIN 5 MG/ML IV SOLN
INTRAVENOUS | Status: AC
  Filled 2017-12-10: qty 10

## 2017-12-10 MED ORDER — ONDANSETRON HCL 4 MG/2ML IJ SOLN
4.0000 mg | Freq: Once | INTRAMUSCULAR | Status: AC
  Administered 2017-12-10: 4 mg via INTRAVENOUS

## 2017-12-10 MED ORDER — FENTANYL CITRATE (PF) 100 MCG/2ML IJ SOLN
INTRAMUSCULAR | Status: AC
  Filled 2017-12-10: qty 2

## 2017-12-10 MED ORDER — ENOXAPARIN SODIUM 40 MG/0.4ML ~~LOC~~ SOLN: 40.0000 mg | SUBCUTANEOUS | Status: DC

## 2017-12-10 MED ORDER — MIDAZOLAM HCL 2 MG/2ML IJ SOLN
INTRAMUSCULAR | Status: AC
  Filled 2017-12-10: qty 2

## 2017-12-10 MED ORDER — PANTOPRAZOLE SODIUM 40 MG PO TBEC
40.0000 mg | DELAYED_RELEASE_TABLET | Freq: Two times a day (BID) | ORAL | Status: DC
Start: 2017-12-10 — End: 2017-12-12
  Administered 2017-12-10 – 2017-12-12 (×4): 40 mg via ORAL
  Filled 2017-12-10 (×4): qty 1

## 2017-12-10 MED ORDER — MORPHINE SULFATE (PF) 4 MG/ML IV SOLN
INTRAVENOUS | Status: AC
  Administered 2017-12-10: 4 mg via INTRAVENOUS
  Filled 2017-12-10: qty 1

## 2017-12-10 MED ORDER — HEPARIN (PORCINE) IN NACL 100-0.45 UNIT/ML-% IJ SOLN
850.0000 [IU]/h | Freq: Once | INTRAMUSCULAR | Status: DC
  Filled 2017-12-10: qty 250

## 2017-12-10 MED ORDER — ENOXAPARIN SODIUM 100 MG/ML ~~LOC~~ SOLN
85.0000 mg | Freq: Two times a day (BID) | SUBCUTANEOUS | Status: DC
  Administered 2017-12-10 – 2017-12-12 (×4): 85 mg via SUBCUTANEOUS
  Filled 2017-12-10 (×5): qty 1

## 2017-12-10 MED ORDER — ONDANSETRON HCL 4 MG PO TABS: 4.0000 mg | ORAL_TABLET | Freq: Four times a day (QID) | ORAL | Status: DC | PRN

## 2017-12-10 MED ORDER — POLYETHYLENE GLYCOL 3350 17 G PO PACK: 17.0000 g | PACK | Freq: Every day | ORAL | Status: DC | PRN

## 2017-12-10 MED ORDER — IOPAMIDOL (ISOVUE-300) INJECTION 61%
INTRAVENOUS | Status: DC | PRN
  Administered 2017-12-10: 150 mL via INTRA_ARTERIAL

## 2017-12-10 MED ORDER — ONDANSETRON HCL 4 MG/2ML IJ SOLN
INTRAMUSCULAR | Status: AC
  Administered 2017-12-10: 4 mg via INTRAVENOUS
  Filled 2017-12-10: qty 2

## 2017-12-10 MED ORDER — LEVOTHYROXINE SODIUM 50 MCG PO TABS
50.0000 ug | ORAL_TABLET | Freq: Every day | ORAL | Status: DC
  Administered 2017-12-11 – 2017-12-12 (×2): 50 ug via ORAL
  Filled 2017-12-10 (×2): qty 1

## 2017-12-10 MED ORDER — HEPARIN SODIUM (PORCINE) 5000 UNIT/ML IJ SOLN
4000.0000 [IU] | Freq: Once | INTRAMUSCULAR | Status: AC
  Administered 2017-12-10: 4000 [IU] via INTRAVENOUS

## 2017-12-10 MED ORDER — CLOPIDOGREL BISULFATE 75 MG PO TABS
600.0000 mg | ORAL_TABLET | Freq: Once | ORAL | Status: AC
  Administered 2017-12-10: 600 mg via ORAL

## 2017-12-10 MED ORDER — SODIUM CHLORIDE 0.9% FLUSH
3.0000 mL | Freq: Two times a day (BID) | INTRAVENOUS | Status: DC
  Administered 2017-12-10 – 2017-12-11 (×3): 3 mL via INTRAVENOUS

## 2017-12-10 MED ORDER — HEPARIN SODIUM (PORCINE) 5000 UNIT/ML IJ SOLN: 4000.0000 [IU] | Freq: Once | INTRAMUSCULAR | Status: DC

## 2017-12-10 MED ORDER — SODIUM CHLORIDE 0.9 % IV SOLN: 250.0000 mL | INTRAVENOUS | Status: DC | PRN

## 2017-12-10 MED ORDER — MORPHINE SULFATE (PF) 4 MG/ML IV SOLN
4.0000 mg | Freq: Once | INTRAVENOUS | Status: AC
  Administered 2017-12-10: 4 mg via INTRAVENOUS

## 2017-12-10 MED ORDER — SODIUM CHLORIDE 0.9% FLUSH: 3.0000 mL | INTRAVENOUS | Status: DC | PRN

## 2017-12-10 MED ORDER — ONDANSETRON HCL 4 MG/2ML IJ SOLN
4.0000 mg | Freq: Four times a day (QID) | INTRAMUSCULAR | Status: DC | PRN
  Administered 2017-12-10 – 2017-12-11 (×2): 4 mg via INTRAVENOUS
  Filled 2017-12-10 (×2): qty 2

## 2017-12-10 MED ORDER — ACETAMINOPHEN 325 MG PO TABS: 650.0000 mg | ORAL_TABLET | ORAL | Status: DC | PRN

## 2017-12-10 MED ORDER — VITAMIN D 50 MCG (2000 UT) PO TABS: 2000.0000 [IU] | ORAL_TABLET | Freq: Every day | ORAL | Status: DC

## 2017-12-10 MED ORDER — SODIUM CHLORIDE 0.9 % IV SOLN
INTRAVENOUS | Status: DC
  Administered 2017-12-10: 1000 mL via INTRAVENOUS

## 2017-12-10 MED ORDER — HEPARIN (PORCINE) IN NACL 1000-0.9 UT/500ML-% IV SOLN
INTRAVENOUS | Status: AC
  Filled 2017-12-10: qty 1000

## 2017-12-10 MED ORDER — VITAMIN D 1000 UNITS PO TABS
2000.0000 [IU] | ORAL_TABLET | Freq: Every day | ORAL | Status: DC
  Administered 2017-12-12: 2000 [IU] via ORAL
  Filled 2017-12-10: qty 2

## 2017-12-10 MED ORDER — TRAMADOL HCL 50 MG PO TABS: 50.0000 mg | ORAL_TABLET | Freq: Four times a day (QID) | ORAL | Status: DC | PRN

## 2017-12-10 MED ORDER — NITROGLYCERIN IN D5W 200-5 MCG/ML-% IV SOLN
INTRAVENOUS | Status: AC
  Filled 2017-12-10: qty 250

## 2017-12-10 MED ORDER — ASPIRIN 81 MG PO CHEW: 324.0000 mg | CHEWABLE_TABLET | Freq: Once | ORAL | Status: DC

## 2017-12-10 MED ORDER — PROPAFENONE HCL 225 MG PO TABS
225.0000 mg | ORAL_TABLET | Freq: Two times a day (BID) | ORAL | Status: DC
  Administered 2017-12-10 – 2017-12-12 (×4): 225 mg via ORAL
  Filled 2017-12-10 (×6): qty 1

## 2017-12-10 MED ORDER — ASPIRIN 81 MG PO CHEW
324.0000 mg | CHEWABLE_TABLET | Freq: Once | ORAL | Status: AC
  Administered 2017-12-10: 324 mg via ORAL

## 2017-12-10 MED ORDER — ACETAMINOPHEN 325 MG PO TABS: 650.0000 mg | ORAL_TABLET | Freq: Four times a day (QID) | ORAL | Status: DC | PRN

## 2017-12-10 MED ORDER — MORPHINE SULFATE (PF) 2 MG/ML IV SOLN
INTRAVENOUS | Status: AC
  Filled 2017-12-10: qty 1

## 2017-12-10 MED ORDER — ONDANSETRON HCL 4 MG/2ML IJ SOLN: 4.0000 mg | Freq: Four times a day (QID) | INTRAMUSCULAR | Status: DC | PRN

## 2017-12-10 MED ORDER — ACETAMINOPHEN 650 MG RE SUPP: 650.0000 mg | Freq: Four times a day (QID) | RECTAL | Status: DC | PRN

## 2017-12-10 MED ORDER — MORPHINE SULFATE (PF) 2 MG/ML IV SOLN
2.0000 mg | Freq: Once | INTRAVENOUS | Status: AC
  Administered 2017-12-10: 2 mg via INTRAVENOUS

## 2017-12-10 MED ORDER — LORAZEPAM 0.5 MG PO TABS
0.2500 mg | ORAL_TABLET | Freq: Every day | ORAL | Status: DC
  Administered 2017-12-10 – 2017-12-11 (×2): 0.25 mg via ORAL
  Filled 2017-12-10 (×2): qty 1

## 2017-12-10 MED ORDER — MIDAZOLAM HCL 2 MG/2ML IJ SOLN
INTRAMUSCULAR | Status: DC | PRN
  Administered 2017-12-10: 1 mg via INTRAVENOUS

## 2017-12-10 MED ORDER — ONDANSETRON HCL 4 MG/2ML IJ SOLN
INTRAMUSCULAR | Status: DC | PRN
  Administered 2017-12-10: 4 mg via INTRAVENOUS

## 2017-12-10 MED ORDER — LISINOPRIL 10 MG PO TABS
10.0000 mg | ORAL_TABLET | Freq: Every day | ORAL | Status: DC
  Administered 2017-12-12: 10 mg via ORAL
  Filled 2017-12-10 (×2): qty 1

## 2017-12-10 SURGICAL SUPPLY — 11 items
CATH INFINITI 5FR ANG PIGTAIL (CATHETERS) ×3 IMPLANT
CATH INFINITI 5FR JL4 (CATHETERS) ×3 IMPLANT
CATH INFINITI JR4 5F (CATHETERS) ×3 IMPLANT
DEVICE CLOSURE MYNXGRIP 6/7F (Vascular Products) ×3 IMPLANT
DEVICE INFLAT 30 PLUS (MISCELLANEOUS) ×3 IMPLANT
KIT MANI 3VAL PERCEP (MISCELLANEOUS) ×3 IMPLANT
NEEDLE PERC 18GX7CM (NEEDLE) ×3 IMPLANT
PACK CARDIAC CATH (CUSTOM PROCEDURE TRAY) ×3 IMPLANT
SHEATH AVANTI 6FR X 11CM (SHEATH) ×3 IMPLANT
WIRE G HI TQ BMW 190 (WIRE) ×3 IMPLANT
WIRE GUIDERIGHT .035X150 (WIRE) ×3 IMPLANT

## 2017-12-10 NOTE — H&P (Signed)
[facility] [place] NAME: Jamie Conner    MR#:  [id number]  DATE OF BIRTH:  11-Apr-1941  DATE OF ADMISSION:  12/10/2017  PRIMARY CARE PHYSICIAN: Jerrol Banana., MD   REQUESTING/REFERRING PHYSICIAN: Dr Clayborn Bigness  CHIEF COMPLAINT:   Chest pain HISTORY OF PRESENT ILLNESS:  Jamie Conner  is a 77 y.o. female with a known history of persistent A fib, HTN  and high grade serous ovarian cancer who presented to ED this am with chest pain.  She reports that after she ate breakfast she developed sudden onset of 10 out of 10 chest pain which radiated to her jaw associated shortness of breath.  She comes to the ER for further evaluation.  In the emergency room it was thought the patient had ST elevation MI and was taken immediately to cardiac catheterization lab.  Cardiac catheterization showed no evidence of significant coronary artery stenosis.  It does appear that her E ejection fraction is moderately reduced in the inferior wall is stunned. Cardiologist thinks her chest pain may be due to stress-induced cardiomyopathy versus pulmonary emboli.  PAST MEDICAL HISTORY:   Past Medical History:  Diagnosis Date  . Atrial fibrillation ([place])   . Breast cancer ([place]) 1998  . Cancer of breast ([place]) 1998   Left/radiation  . CINV (chemotherapy-induced nausea and vomiting)   . GERD (gastroesophageal reflux disease)   . Hyperlipidemia   . Hypothyroidism   . Mitral insufficiency   . Ovarian cancer ([place])    s/p BSO optimal tumor debulking May 2016/chemo  . Personal history of chemotherapy since 2016   ovarian cancer  . Personal history of radiation therapy 1998  . PVC (premature ventricular contraction)     PAST SURGICAL HISTORY:   Past Surgical History:  Procedure Laterality Date  . ABDOMINAL HYSTERECTOMY    . BILATERAL SALPINGOOPHORECTOMY  May 2016   with optimal tumor debulking   . BREAST LUMPECTOMY Left 1998  . BREAST SURGERY     Left   . CESAREAN SECTION     x2  . CHOLECYSTECTOMY    . COLONOSCOPY  2006  . DEBULKING N/A 12/22/2014   Procedure: DEBULKING;  Surgeon: Gillis Ends, MD;  Location: [facility] ORS;  Service: Gynecology;  Laterality: N/A;  . LAPAROTOMY N/A 12/22/2014   Procedure: EXPLORATORY LAPAROTOMY;  Surgeon: Robert Bellow, MD;  Location: [facility] ORS;  Service: General;  Laterality: N/A;  . LAPAROTOMY WITH STAGING N/A 12/22/2014   Procedure: LAPAROTOMY WITH STAGING;  Surgeon: Gillis Ends, MD;  Location: [facility] ORS;  Service: Gynecology;  Laterality: N/A;  . PERIPHERAL VASCULAR CATHETERIZATION Left 12/22/2014   Procedure: PORTA CATH INSERTION;  Surgeon: Robert Bellow, MD;  Location: [facility] ORS;  Service: General;  Laterality: Left;  . PORTACATH PLACEMENT Right 12/22/2014   Procedure: INSERTION PORT-A-CATH;  Surgeon: Robert Bellow, MD;  Location: [facility] ORS;  Service: General;  Laterality: Right;  . SALPINGOOPHORECTOMY Bilateral 12/22/2014   Procedure: SALPINGO OOPHORECTOMY;  Surgeon: Gillis Ends, MD;  Location: [facility] ORS;  Service: Gynecology;  Laterality: Bilateral;  . UPPER GI ENDOSCOPY  09/25/04   hiatus hernia, schatzki ring and a single gastric polyp    SOCIAL HISTORY:   Social History   Tobacco Use  . Smoking status: Never Smoker  . Smokeless tobacco: Never Used  Substance Use Topics  . Alcohol use: No    FAMILY HISTORY:   Family History  Problem Relation Age of Onset  . Cancer  Mother        breast, throat, and stomach  . Breast cancer Mother   . Heart disease Father   . Pulmonary embolism Sister   . Cancer Brother 60       angiosarcoma of the chest; carcinoid small intestinal tumor  . Hypertension Brother   . Cancer Maternal Aunt        breast cancer  . Cancer Maternal Grandfather 43       pancreatic    DRUG ALLERGIES:   Allergies  Allergen Reactions  . Carafate [Sucralfate] Rash  . Sulfa Antibiotics Rash    REVIEW OF SYSTEMS:   Review of Systems   Constitutional: Negative.  Negative for chills, fever and malaise/fatigue.  HENT: Negative.  Negative for ear discharge, ear pain, hearing loss, nosebleeds and sore throat.   Eyes: Negative.  Negative for blurred vision and pain.  Respiratory: Negative.  Negative for cough, hemoptysis, shortness of breath and wheezing.   Cardiovascular: Positive for chest pain. Negative for palpitations and leg swelling.  Gastrointestinal: Negative.  Negative for abdominal pain, blood in stool, diarrhea, nausea and vomiting.  Genitourinary: Negative.  Negative for dysuria.  Musculoskeletal: Negative.  Negative for back pain.  Skin: Negative.   Neurological: Negative for dizziness, tremors, speech change, focal weakness, seizures and headaches.  Endo/Heme/Allergies: Negative.  Does not bruise/bleed easily.  Psychiatric/Behavioral: Negative.  Negative for depression, hallucinations and suicidal ideas.    MEDICATIONS AT HOME:   Prior to Admission medications   Medication Sig Start Date End Date Taking? Authorizing Provider  Cholecalciferol (VITAMIN D) 2000 units tablet Take 1 tablet (2,000 Units total) by mouth daily. 07/09/17  Yes Jerrol Banana., MD  Docosanol [facility] EX) Apply 1 application topically as needed. Fever blisters   Yes [provider]  levothyroxine (SYNTHROID, LEVOTHROID) 50 MCG tablet TAKE 1 TABLET EVERY DAY ON AN EMPTY STOMACH WITH A GLASS OF WATER AT LEAST 30-60 MINUTES BEFORE BREAKFAST 06/25/17  Yes Jerrol Banana., MD  lidocaine-prilocaine (EMLA) cream Apply to port area 30-45 mins prior to chemo. 06/24/17  Yes Cammie Sickle, MD  lisinopril (PRINIVIL,ZESTRIL) 10 MG tablet Take 1 tablet (10 mg total) by mouth daily. 11/18/17  Yes Cammie Sickle, MD  LORazepam (ATIVAN) 0.5 MG tablet Take 0.5 tablets (0.25 mg total) by mouth at bedtime. 10/07/17  Yes Cammie Sickle, MD  Lysine 500 MG TABS Take 1 tablet by mouth daily as needed.    Yes [provider]  magic mouthwash w/lidocaine SOLN Take 5 mLs by mouth every 4 (four) hours as needed. 10/05/17  Yes [provider]  Multiple Vitamin (MULTIVITAMIN) capsule Take 1 capsule by mouth daily.   Yes [provider]  ondansetron (ZOFRAN) 4 MG tablet One pill 30-60 mins prior to taking zejula to prevent nausea/vomitting. 11/27/17  Yes Cammie Sickle, MD  propafenone (RYTHMOL) 225 MG tablet Take 225 mg by mouth 2 (two) times daily.   Yes [provider]  lactulose (CHRONULAC) 10 GM/15ML solution TAKE 15MLS (10G TOTAL) BY MOUTH THREE TIMES A DAY AS DIRECTED BY PHYSICIAN. Patient not taking: Reported on 11/09/2017 06/18/16   Cammie Sickle, MD  Niraparib Tosylate 100 MG CAPS Take 300 mg by mouth daily. Take 3 pills at same time of the day. Take a zofran 30 mins prior to avoid nausea. Patient not taking: Reported on 12/10/2017 11/27/17   Cammie Sickle, MD  pantoprazole (PROTONIX) 40 MG tablet TAKE 1 TABLET BY MOUTH  DAILY Patient not taking: Reported on 11/27/2017 06/15/17   Jerrol Banana., MD  polyethylene glycol powder (GLYCOLAX/MIRALAX) powder TAKE 17GM (LINE INSIDE CAP) IN EIGHT OUNCES OF WATER OR OTHER LIQUID DAILY Patient not taking: Reported on 11/18/2017 07/08/17   Cammie Sickle, MD  prochlorperazine (COMPAZINE) 10 MG tablet Take 1 tablet (10 mg total) by mouth every 6 (six) hours as needed for nausea or vomiting. Patient not taking: Reported on 11/18/2017 09/27/16   Cammie Sickle, MD  traMADol (ULTRAM) 50 MG tablet Take 1 tablet (50 mg total) by mouth every 6 (six) hours as needed. Patient not taking: Reported on 11/09/2017 09/27/16   Cammie Sickle, MD      VITAL SIGNS:  Blood pressure 131/75, pulse 88, temperature 98.5 F (36.9 C), resp. rate 18, height 5\' 4"  (1.626 m), weight 84.8 kg (187 lb), SpO2 97 %.  PHYSICAL EXAMINATION:   Physical Exam  Constitutional: She is oriented to person, place, and time. No  distress.  HENT:  Head: Normocephalic.  Eyes: No scleral icterus.  Neck: Normal range of motion. Neck supple. No JVD present. No tracheal deviation present.  Cardiovascular: Normal rate, regular rhythm and normal heart sounds. Exam reveals no gallop and no friction rub.  No murmur heard. Pulmonary/Chest: Effort normal and breath sounds normal. No respiratory distress. She has no wheezes. She has no rales. She exhibits no tenderness.  Abdominal: Soft. Bowel sounds are normal. She exhibits no distension and no mass. There is no tenderness. There is no rebound and no guarding.  Musculoskeletal: Normal range of motion. She exhibits no edema.  Neurological: She is alert and oriented to person, place, and time.  Skin: Skin is warm. No rash noted. No erythema.  Psychiatric: Judgment normal.      LABORATORY PANEL:   CBC Recent Labs  Lab 12/10/17 1040  WBC 9.8  HGB 16.1*  HCT 46.9  PLT 277   ------------------------------------------------------------------------------------------------------------------  Chemistries  Recent Labs  Lab 12/10/17 1040  NA 134*  K 4.2  CL 99*  CO2 26  GLUCOSE 117*  BUN 19  CREATININE 1.27*  CALCIUM 9.9  AST 41  ALT 28  ALKPHOS 89  BILITOT 0.8   ------------------------------------------------------------------------------------------------------------------  Cardiac Enzymes Recent Labs  Lab 12/10/17 1040  TROPONINI 0.09*   ------------------------------------------------------------------------------------------------------------------  RADIOLOGY:  Dg Chest Portable 1 View  Result Date: 12/10/2017 CLINICAL DATA:  Myocardial infarct. History of ovarian carcinoma. History of breast carcinoma EXAM: PORTABLE CHEST 1 VIEW COMPARISON:  Chest radiograph July 05, 2016 PET-CT November 04, 2017 FINDINGS: There is no edema or consolidation. Heart is upper normal in size with pulmonary vascularity within normal limits. There is aortic  atherosclerosis. Port-A-Cath tip is in the superior vena cava. No pneumothorax. No evident bone lesions. IMPRESSION: No edema or consolidation. Heart upper normal in size. There is aortic atherosclerosis. Aortic Atherosclerosis (ICD10-I70.0). Electronically Signed   By: Lowella Grip III M.D.   On: 12/10/2017 11:06    EKG:  1st degree AV block with ST elevation in inferior leads  IMPRESSION AND PLAN:    77 y/o female with ovarian cancer, HTN and persistent Atrial Fibrillation who presented with chest pain and concern for STEMI.  1. Chest pain: She was taken to cath lab and as per Dr Clayborn Bigness had no significant blockage of coronary arteries. Management as per Dr. Clayborn Bigness Order ECHO Follow troponins  CT chest in am to evaluate chest pain and abnormal EKG Follow Tele  2. Persistent A fib: Continue  Rythmol   3. HTN: Continue Lisinopril  4.  History of ovarian cancer: Patient is followed by Dr. Rogue Bussing  5.  Hypothyroidism: Continue Synthroid  All the records are reviewed and case discussed with Dr Clayborn Bigness Management plans discussed with the patient and she is in agreement  CODE STATUS: FULL  TOTAL TIME TAKING CARE OF THIS PATIENT: 41 minutes.    Kenn Rekowski M.D on 12/10/2017 at 11:42 AM  Between 7am to 6pm - Pager - [phone]  After 6pm go to [email or web address] - password [email or web address] [facility]  Office  [phone]  CC: Primary care physician; Jerrol Banana., MD

## 2017-12-10 NOTE — ED Notes (Signed)
CODE  STEMI  CALLED  TO  [facility] 

## 2017-12-10 NOTE — Progress Notes (Signed)
Verbally notified Dr. Clayborn Bigness of critical troponin value of 0.09.

## 2017-12-10 NOTE — Plan of Care (Signed)
Pt transferred from ICU. Pt educated on plan of care. See flowsheet for assessment.  Pt refused SCD despite education. +1 Assist to the bathroom, no s/s of bleeding on site or complain of pain

## 2017-12-10 NOTE — ED Provider Notes (Signed)
[facility] Emergency Department Provider Note   ____________________________________________   First MD Initiated Contact with Patient 12/10/17 1046     (approximate)  I have reviewed the triage vital signs and the nursing notes.   HISTORY  Chief Complaint Chest Pain    HPI Jamie Conner is a 77 y.o. female who comes in reporting a half an hour of shortness of breath weakness and sweating.  It started just after she ate breakfast.  She has not had this before.  Has had ovarian cancer for 3 years, started on the medicine 9 days ago.  Pain is severe made worse with any exercise.   Past Medical History:  Diagnosis Date  . Atrial fibrillation ([place])   . Breast cancer ([place]) 1998  . Cancer of breast ([place]) 1998   Left/radiation  . CINV (chemotherapy-induced nausea and vomiting)   . GERD (gastroesophageal reflux disease)   . Hyperlipidemia   . Hypothyroidism   . Mitral insufficiency   . Ovarian cancer ([place])    s/p BSO optimal tumor debulking May 2016/chemo  . Personal history of chemotherapy since 2016   ovarian cancer  . Personal history of radiation therapy 1998  . PVC (premature ventricular contraction)     Patient Active Problem List   Diagnosis Date Noted  . Ovarian cancer, unspecified laterality ([place]) 09/11/2017  . Counseling regarding goals of care 11/26/2016  . Encounter for monitoring cardiotoxic drug therapy 02/02/2016  . Breast cancer in female [facility]) 12/13/2015  . Peptic ulcer disease 12/12/2015  . DDD (degenerative disc disease), lumbosacral 12/12/2015  . Hyperlipidemia 12/12/2015  . Acute anxiety 12/12/2015  . Insomnia 12/12/2015  . Allergic rhinitis 12/12/2015  . GERD (gastroesophageal reflux disease) 12/12/2015  . Internal hemorrhoid 12/12/2015  . OA (osteoarthritis) 12/12/2015  . Osteopenia 12/12/2015  . Hypothyroid 04/11/2015  . Benign essential HTN 02/01/2015  . Retroperitoneal fibrosis   . Combined fat and  carbohydrate induced hyperlipemia 01/04/2015  . Awareness of heartbeats 01/04/2015  . Beat, premature ventricular 01/04/2015  . Malignant neoplasm of ovary ([place]) 12/16/2014  . MI (mitral incompetence) 09/02/2014  . AF (paroxysmal atrial fibrillation) ([place]) 09/02/2014    Past Surgical History:  Procedure Laterality Date  . ABDOMINAL HYSTERECTOMY    . BILATERAL SALPINGOOPHORECTOMY  May 2016   with optimal tumor debulking   . BREAST LUMPECTOMY Left 1998  . BREAST SURGERY     Left  . CESAREAN SECTION     x2  . CHOLECYSTECTOMY    . COLONOSCOPY  2006  . DEBULKING N/A 12/22/2014   Procedure: DEBULKING;  Surgeon: Gillis Ends, MD;  Location: [facility] ORS;  Service: Gynecology;  Laterality: N/A;  . LAPAROTOMY N/A 12/22/2014   Procedure: EXPLORATORY LAPAROTOMY;  Surgeon: Robert Bellow, MD;  Location: [facility] ORS;  Service: General;  Laterality: N/A;  . LAPAROTOMY WITH STAGING N/A 12/22/2014   Procedure: LAPAROTOMY WITH STAGING;  Surgeon: Gillis Ends, MD;  Location: [facility] ORS;  Service: Gynecology;  Laterality: N/A;  . PERIPHERAL VASCULAR CATHETERIZATION Left 12/22/2014   Procedure: PORTA CATH INSERTION;  Surgeon: Robert Bellow, MD;  Location: [facility] ORS;  Service: General;  Laterality: Left;  . PORTACATH PLACEMENT Right 12/22/2014   Procedure: INSERTION PORT-A-CATH;  Surgeon: Robert Bellow, MD;  Location: [facility] ORS;  Service: General;  Laterality: Right;  . SALPINGOOPHORECTOMY Bilateral 12/22/2014   Procedure: SALPINGO OOPHORECTOMY;  Surgeon: Gillis Ends, MD;  Location: [facility] ORS;  Service: Gynecology;  Laterality: Bilateral;  . UPPER GI  ENDOSCOPY  09/25/04   hiatus hernia, schatzki ring and a single gastric polyp    Prior to Admission medications   Medication Sig Start Date End Date Taking? Authorizing Provider  acetaminophen (TYLENOL) 500 MG chewable tablet Chew 500 mg by mouth every 6 (six) hours as needed for pain.    [provider]  Cholecalciferol  (VITAMIN D) 2000 units tablet Take 1 tablet (2,000 Units total) by mouth daily. 07/09/17   Jerrol Banana., MD  Docosanol (ABREVA EX) Apply 1 application topically as needed. Fever blisters    [provider]  lactulose (CHRONULAC) 10 GM/15ML solution TAKE 15MLS (10G TOTAL) BY MOUTH THREE TIMES A DAY AS DIRECTED BY PHYSICIAN. Patient not taking: Reported on 11/09/2017 06/18/16   Cammie Sickle, MD  levothyroxine (SYNTHROID, LEVOTHROID) 50 MCG tablet TAKE 1 TABLET EVERY DAY ON AN EMPTY STOMACH WITH A GLASS OF WATER AT LEAST 30-60 MINUTES BEFORE BREAKFAST 06/25/17   Jerrol Banana., MD  lidocaine-prilocaine (EMLA) cream Apply to port area 30-45 mins prior to chemo. 06/24/17   Cammie Sickle, MD  lisinopril (PRINIVIL,ZESTRIL) 10 MG tablet Take 1 tablet (10 mg total) by mouth daily. 11/18/17   Cammie Sickle, MD  LORazepam (ATIVAN) 0.5 MG tablet Take 0.5 tablets (0.25 mg total) by mouth at bedtime. 10/07/17   Cammie Sickle, MD  Lysine 500 MG TABS Take 1 tablet by mouth daily as needed.     [provider]  magic mouthwash w/lidocaine SOLN Take 5 mLs by mouth every 4 (four) hours as needed. 10/05/17   [provider]  Multiple Vitamin (MULTIVITAMIN) capsule Take 1 capsule by mouth daily.    [provider]  Niraparib Tosylate 100 MG CAPS Take 300 mg by mouth daily. Take 3 pills at same time of the day. Take a zofran 30 mins prior to avoid nausea. 11/27/17   Cammie Sickle, MD  ondansetron (ZOFRAN) 4 MG tablet One pill 30-60 mins prior to taking zejula to prevent nausea/vomitting. 11/27/17   Cammie Sickle, MD  pantoprazole (PROTONIX) 40 MG tablet TAKE 1 TABLET BY MOUTH DAILY Patient not taking: Reported on 11/27/2017 06/15/17   Jerrol Banana., MD  polyethylene glycol powder (GLYCOLAX/MIRALAX) powder TAKE 17GM (LINE INSIDE CAP) IN EIGHT OUNCES OF WATER OR OTHER LIQUID DAILY Patient not taking: Reported on 11/18/2017  07/08/17   Cammie Sickle, MD  prochlorperazine (COMPAZINE) 10 MG tablet Take 1 tablet (10 mg total) by mouth every 6 (six) hours as needed for nausea or vomiting. Patient not taking: Reported on 11/18/2017 09/27/16   Cammie Sickle, MD  propafenone (RYTHMOL) 225 MG tablet Take 225 mg by mouth 2 (two) times daily.    [provider]  traMADol (ULTRAM) 50 MG tablet Take 1 tablet (50 mg total) by mouth every 6 (six) hours as needed. Patient not taking: Reported on 11/09/2017 09/27/16   Cammie Sickle, MD    Allergies Carafate [sucralfate] and Sulfa antibiotics  Family History  Problem Relation Age of Onset  . Cancer Mother        breast, throat, and stomach  . Breast cancer Mother   . Heart disease Father   . Pulmonary embolism Sister   . Cancer Brother 60       angiosarcoma of the chest; carcinoid small intestinal tumor  . Hypertension Brother   . Cancer Maternal Aunt        breast cancer  . Cancer Maternal Grandfather 75  pancreatic    Social History Social History   Tobacco Use  . Smoking status: Never Smoker  . Smokeless tobacco: Never Used  Substance Use Topics  . Alcohol use: No  . Drug use: No    Review of Systems  Constitutional: No fever/chills Eyes: No visual changes. ENT: No sore throat. Cardiovasculachest pain. Respiratory:  shortness of breath. Gastrointestinal: No abdominal pain.  No nausea, no vomiting.  No diarrhea.  No constipation. Genitourinary: Negative for dysuria. Musculoskeletal: Negative for back pain. Skin: Negative for rash. Neurological: Negative for headaches, focal weakness  ____________________________________________   PHYSICAL EXAM:  VITAL SIGNS: ED Triage Vitals [12/10/17 1033]  Enc Vitals Group     BP (!) 158/94     Pulse Rate 90     Resp 16     Temp (!) 97.4 F (36.3 C)     Temp Source Oral     SpO2 97 %     Weight 187 lb (84.8 kg)     Height '5\' 4"'$  (1.626 m)     Head Circumference       Peak Flow      Pain Score 9     Pain Loc      Pain Edu?      Excl. in [place]?     Constitutional: Alert and oriented. Well appearing and in no acute distress. Eyes: Conjunctivae are normal.  Nose: No congestion/rhinnorhea. Mouth/Throat: Mucous membranes are moist.  Oropharynx non-erythematous. Neck: No stridor.  Cardiovascular: Normal rate, regular rhythm. Grossly normal heart sounds.  Good peripheral circulation. Respiratory: Normal respiratory effort.  No retractions. Lungs CTAB. Gastrointestinal: Soft and nontender. No distention. No abdominal bruits. No CVA tenderness. Musculoskeletal: No lower extremity tenderness nor edema.  No joint effusions. Neurologic:  Normal speech and language. No gross focal neurologic deficits are appreciated. No gait instability. Skin:  Skin is warm, dry and intact. No rash noted. Psychiatric: Mood and affect are normal. Speech and behavior are normal.  ____________________________________________   LABS (all labs ordered are listed, but only abnormal results are displayed)  Labs Reviewed  CBC WITH DIFFERENTIAL/PLATELET  PROTIME-INR  APTT  COMPREHENSIVE METABOLIC PANEL  TROPONIN I  LIPID PANEL   ____________________________________________  EKG  EKG read and interpreted by me shows sinus rhythm at 91 first-degree AV block patient has ST segment elevation 2 3 and F flipped T's in aVL there is also slight ST elevation about a millimeter in V3 V4 V5.  This is all new from previous EKG. ____________________________________________  RADIOLOGY  Chest x-ray read and interpreted by me looks essentially normal  Official radiology report(s): Dg Chest Portable 1 View  Result Date: 12/10/2017 CLINICAL DATA:  Myocardial infarct. History of ovarian carcinoma. History of breast carcinoma EXAM: PORTABLE CHEST 1 VIEW COMPARISON:  Chest radiograph July 05, 2016 PET-CT November 04, 2017 FINDINGS: There is no edema or consolidation. Heart is upper normal in  size with pulmonary vascularity within normal limits. There is aortic atherosclerosis. Port-A-Cath tip is in the superior vena cava. No pneumothorax. No evident bone lesions. IMPRESSION: No edema or consolidation. Heart upper normal in size. There is aortic atherosclerosis. Aortic Atherosclerosis (ICD10-I70.0). Electronically Signed   By: Lowella Grip III M.D.   On: 12/10/2017 11:06    ____________________________________________   PROCEDURES  Procedure(s) performed:   Procedures  Critical Care performed:   ____________________________________________   INITIAL IMPRESSION / ASSESSMENT AND PLAN / ED COURSE  Patient is having a STEMI I told called Dr. Clayborn Bigness who is coming  down to see the patient she has had aspirin Plavix heparin bolus labs done give HER-2 of morphine and 4 of Zofran for chest pain which is still severe 9 out of 10 she is now nauseated at many of her for more morphine and for more Zofran.  Waiting on Dr. Clayborn Bigness.  Patient has not had any recent surgery does not have any gastrointestinal bleeding has not hit her head there is no contraindications to blood thinners         ____________________________________________   FINAL CLINICAL IMPRESSION(S) / ED DIAGNOSES  Final diagnoses:  ST elevation myocardial infarction (STEMI), unspecified artery [facility])     ED Discharge Orders    None       Note:  This document was prepared using Dragon voice recognition software and may include unintentional dictation errors.    Nena Polio, MD 12/10/17 [phone]

## 2017-12-10 NOTE — ED Notes (Signed)
Pt has moderate amount of emesis

## 2017-12-10 NOTE — Progress Notes (Signed)
Chaplain was paged by nurse stating there was a code stemi. Chaplain checked pager and no page was seen. Chaplain was notified by nurse that husband said no support was needed. Chaplain went to speak to husband who stated there was not a need. He then begin to talk about how  long they have endured sickness. Chaplain practiced active listening as he talked about caring for a 75 year old mother with dementia and a 58 year old day who cant remember.> He talked about his wife having ovarian cancer and now a heart attack. He talked about his support and his family. His son in law came. Chaplain let them know of the chaplain's availability   12/10/17 1100  Clinical Encounter Type  Visited With Family  Visit Type Initial;Spiritual support;ED  Referral From Nurse  Spiritual Encounters  Spiritual Needs Prayer  .

## 2017-12-10 NOTE — ED Notes (Signed)
Dr. Callwood at bedside. °

## 2017-12-10 NOTE — ED Notes (Signed)
On Zoll defib pads. EDP at bedside. h

## 2017-12-10 NOTE — ED Triage Notes (Signed)
Pt to ed with c/o chest pain that started about 30 min ago.  Pt +sob, weakness, diaphoresis.

## 2017-12-10 NOTE — ED Notes (Addendum)
Taken to cath lab by Otila Kluver, RN and Dr. Clayborn Bigness on Jamie Conner. Husband behind patient with all of belongings. Pt alert and oriented X4, active, cooperative, pt in NAD. RR even and unlabored, color WNL.

## 2017-12-11 ENCOUNTER — Encounter: Payer: Self-pay | Admitting: Radiology

## 2017-12-11 ENCOUNTER — Inpatient Hospital Stay: Payer: PPO

## 2017-12-11 ENCOUNTER — Inpatient Hospital Stay: Payer: PPO | Admitting: Internal Medicine

## 2017-12-11 DIAGNOSIS — R112 Nausea with vomiting, unspecified: Secondary | ICD-10-CM

## 2017-12-11 DIAGNOSIS — Z9221 Personal history of antineoplastic chemotherapy: Secondary | ICD-10-CM

## 2017-12-11 DIAGNOSIS — C569 Malignant neoplasm of unspecified ovary: Secondary | ICD-10-CM

## 2017-12-11 DIAGNOSIS — R079 Chest pain, unspecified: Principal | ICD-10-CM

## 2017-12-11 DIAGNOSIS — Z923 Personal history of irradiation: Secondary | ICD-10-CM

## 2017-12-11 DIAGNOSIS — I4891 Unspecified atrial fibrillation: Secondary | ICD-10-CM

## 2017-12-11 DIAGNOSIS — Z79899 Other long term (current) drug therapy: Secondary | ICD-10-CM

## 2017-12-11 DIAGNOSIS — E785 Hyperlipidemia, unspecified: Secondary | ICD-10-CM

## 2017-12-11 DIAGNOSIS — C786 Secondary malignant neoplasm of retroperitoneum and peritoneum: Secondary | ICD-10-CM

## 2017-12-11 DIAGNOSIS — K219 Gastro-esophageal reflux disease without esophagitis: Secondary | ICD-10-CM

## 2017-12-11 DIAGNOSIS — I10 Essential (primary) hypertension: Secondary | ICD-10-CM

## 2017-12-11 LAB — ECHOCARDIOGRAM COMPLETE
Height: 64 in
Weight: 2984 oz

## 2017-12-11 LAB — TROPONIN I
Troponin I: 1.86 ng/mL (ref <0.03)
Troponin I: 2.38 ng/mL (ref <0.03)
Troponin I: 4.15 ng/mL (ref <0.03)

## 2017-12-11 MED ORDER — IOPAMIDOL (ISOVUE-370) INJECTION 76%
75.0000 mL | Freq: Once | INTRAVENOUS | Status: AC | PRN
  Administered 2017-12-11: 75 mL via INTRAVENOUS

## 2017-12-11 MED ORDER — ALUM & MAG HYDROXIDE-SIMETH 200-200-20 MG/5ML PO SUSP
15.0000 mL | Freq: Four times a day (QID) | ORAL | Status: DC | PRN
  Administered 2017-12-11 – 2017-12-12 (×3): 15 mL via ORAL
  Filled 2017-12-11 (×3): qty 30

## 2017-12-11 MED ORDER — SODIUM CHLORIDE 0.9 % IV SOLN
INTRAVENOUS | Status: DC
  Administered 2017-12-11 (×2): via INTRAVENOUS

## 2017-12-11 NOTE — Plan of Care (Signed)
  Problem: Clinical Measurements: Goal: Diagnostic test results will improve Outcome: Progressing Goal: Cardiovascular complication will be avoided Outcome: Progressing   Problem: Nutrition: Goal: Adequate nutrition will be maintained Outcome: Progressing   

## 2017-12-11 NOTE — Progress Notes (Signed)
[facility] NAME: Janicia Monterrosa    MR#:  [id number]  DATE OF BIRTH:  Jan 24, 1941  SUBJECTIVE:  CHIEF COMPLAINT: Patient denies any active chest pain nausea and vomiting improved..  Reports a stage IV ovarian cancer  REVIEW OF SYSTEMS:  CONSTITUTIONAL: No fever, fatigue or weakness.  EYES: No blurred or double vision.  EARS, NOSE, AND THROAT: No tinnitus or ear pain.  RESPIRATORY: No cough, shortness of breath, wheezing or hemoptysis.  CARDIOVASCULAR: No chest pain, orthopnea, edema.  GASTROINTESTINAL: No nausea, vomiting, diarrhea or abdominal pain.  GENITOURINARY: No dysuria, hematuria.  ENDOCRINE: No polyuria, nocturia,  HEMATOLOGY: No anemia, easy bruising or bleeding SKIN: No rash or lesion. MUSCULOSKELETAL: No joint pain or arthritis.   NEUROLOGIC: No tingling, numbness, weakness.  PSYCHIATRY: No anxiety or depression.   DRUG ALLERGIES:   Allergies  Allergen Reactions  . Carafate [Sucralfate] Rash  . Sulfa Antibiotics Rash    VITALS:  Blood pressure 97/62, pulse 87, temperature 98.4 F (36.9 C), temperature source Oral, resp. rate 18, height 5\' 4"  (1.626 m), weight 84.6 kg (186 lb 8 oz), SpO2 96 %.  PHYSICAL EXAMINATION:  GENERAL:  77 y.o.-year-old patient lying in the bed with no acute distress.  EYES: Pupils equal, round, reactive to light and accommodation. No scleral icterus. Extraocular muscles intact.  HEENT: Head atraumatic, normocephalic. Oropharynx and nasopharynx clear.  NECK:  Supple, no jugular venous distention. No thyroid enlargement, no tenderness.  LUNGS: Normal breath sounds bilaterally, no wheezing, rales,rhonchi or crepitation. No use of accessory muscles of respiration.  CARDIOVASCULAR: S1, S2 normal. No murmurs, rubs, or gallops.  ABDOMEN: Soft, nontender, nondistended. Bowel sounds present. No organomegaly or mass.  EXTREMITIES: No pedal edema, cyanosis, or clubbing.  NEUROLOGIC:  Cranial nerves II through XII are intact. Muscle strength 5/5 in all extremities. Sensation intact. Gait not checked.  PSYCHIATRIC: The patient is alert and oriented x 3.  SKIN: No obvious rash, lesion, or ulcer.    LABORATORY PANEL:   CBC Recent Labs  Lab 12/10/17 1259  WBC 9.0  HGB 15.6  HCT 46.5  PLT 232   ------------------------------------------------------------------------------------------------------------------  Chemistries  Recent Labs  Lab 12/10/17 1040 12/10/17 1259  NA 134*  --   K 4.2  --   CL 99*  --   CO2 26  --   GLUCOSE 117*  --   BUN 19  --   CREATININE 1.27* 1.11*  CALCIUM 9.9  --   AST 41  --   ALT 28  --   ALKPHOS 89  --   BILITOT 0.8  --    ------------------------------------------------------------------------------------------------------------------  Cardiac Enzymes Recent Labs  Lab 12/10/17 2349  TROPONINI 4.15*   ------------------------------------------------------------------------------------------------------------------  RADIOLOGY:  Ct Angio Chest Pe W Or Wo Contrast  Result Date: 12/11/2017 CLINICAL DATA:  Chest pain, shortness of breath EXAM: CT ANGIOGRAPHY CHEST WITH CONTRAST TECHNIQUE: Multidetector CT imaging of the chest was performed using the standard protocol during bolus administration of intravenous contrast. Multiplanar CT image reconstructions and MIPs were obtained to evaluate the vascular anatomy. CONTRAST:  87mL ISOVUE-370 IOPAMIDOL (ISOVUE-370) INJECTION 76% COMPARISON:  None. FINDINGS: Cardiovascular: Satisfactory opacification of the pulmonary arteries to the segmental level. No evidence of pulmonary embolism. Dilated main artery as can be seen with pulmonary arterial hypertension normal heart size. No pericardial effusion. Thoracic aortic atherosclerosis. Mediastinum/Nodes: No enlarged mediastinal, hilar, or axillary lymph nodes. Thyroid gland, trachea, and esophagus demonstrate no significant findings.  Lungs/Pleura:  Mild bibasilar atelectasis. No pleural effusion or pneumothorax. No consolidation. Upper Abdomen: No acute upper abdominal abnormality. 3.8 cm hypodense right hepatic mass unchanged compared with 10/23/2017 at which time the appearance was most compatible with a hemangioma. Musculoskeletal: No acute osseous abnormality. No aggressive osseous lesion. Review of the MIP images confirms the above findings. IMPRESSION: 1. No evidence of pulmonary embolus. 2. No acute cardiopulmonary disease. 3. Aortic Atherosclerosis (ICD10-I70.0). Electronically Signed   By: Kathreen Devoid   On: 12/11/2017 11:00   Dg Chest Portable 1 View  Result Date: 12/10/2017 CLINICAL DATA:  Myocardial infarct. History of ovarian carcinoma. History of breast carcinoma EXAM: PORTABLE CHEST 1 VIEW COMPARISON:  Chest radiograph July 05, 2016 PET-CT November 04, 2017 FINDINGS: There is no edema or consolidation. Heart is upper normal in size with pulmonary vascularity within normal limits. There is aortic atherosclerosis. Port-A-Cath tip is in the superior vena cava. No pneumothorax. No evident bone lesions. IMPRESSION: No edema or consolidation. Heart upper normal in size. There is aortic atherosclerosis. Aortic Atherosclerosis (ICD10-I70.0). Electronically Signed   By: Lowella Grip III M.D.   On: 12/10/2017 11:06    EKG:   Orders placed or performed during the hospital encounter of 12/10/17  . EKG 12-Lead  . EKG 12-Lead  . EKG    ASSESSMENT AND PLAN:    77 y/o female with ovarian cancer, HTN and persistent Atrial Fibrillation who presented with chest pain and concern for STEMI.  1. Chest pain: She was taken to cath lab and as per Dr Clayborn Bigness had no significant blockage of coronary arteries. Management as per Dr. Clayborn Bigness  ECHO Follow troponins 4.00-4.15 Patient is on therapeutic dose Lovenox CTA chest negative for pulmonary embolism Continue telemetry  2.   nausea and vomiting resolved with antiemetics  and supportive treatment  3. HTN: Continue Lisinopril  4.  History of ovarian cancer: Patient is followed by Dr. Rogue Bussing, consult placed with Dr. Jacinto Reap  5.Persistent A fib: Continue Rythmol    6.  Hypothyroidism: Continue Synthroid      All the records are reviewed and case discussed with Care Management/Social Workerr. Management plans discussed with the patient, family and they are in agreement.  CODE STATUS: fc   TOTAL TIME TAKING CARE OF THIS PATIENT: 36  minutes.   POSSIBLE D/C IN 1-2  DAYS, DEPENDING ON CLINICAL CONDITION.  Note: This dictation was prepared with Dragon dictation along with smaller phrase technology. Any transcriptional errors that result from this process are unintentional.   Nicholes Mango M.D on 12/11/2017 at 5:01 PM  Between 7am to 6pm - Pager - [phone] After 6pm go to [email or web address] - password [email or web address] [facility]  [facility]  Office  [phone]  CC: Primary care physician; Jerrol Banana., MD

## 2017-12-11 NOTE — Progress Notes (Signed)
Patient has rested quietly this shift. Mild nausea improved by end of shift. PO intake is improving. Up to chair for awhile this afternoon. Ambulating with standby assist to the bathroom.

## 2017-12-11 NOTE — Consult Note (Signed)
[facility] NOTE  Patient Care Team: Jerrol Banana., MD as PCP - General (Family Medicine) Gillis Ends, MD as Referring Physician (Obstetrics and Gynecology) Bary Castilla Forest Gleason, MD as Consulting Physician (General Surgery)  CHIEF COMPLAINTS/PURPOSE OF CONSULTATION:  Recurrent ovarian cancer  HISTORY OF PRESENTING ILLNESS:  Jamie Conner 77 y.o.  female with a history of recurrent ovarian cancer; A. Fib-he is currently admitted the hospital for severe/sudden onset of chest pain.  Patient woke up yesterday and breakfast noted to have intense pain-an EKG in the emergency room concerning for ST elevation MI.  Patient was evaluated with a cardiac cath -that showed no significant coronary disease; but depressed ejection fraction 30-35%.  Patient was subsequently started on Lovenox for the possibility of PE.  Patient subsequently had a CT scan of the chest that did not show any acute PE.  Patient complains of nausea with vomiting overnight.  Denies any blood in the vomitus.  In general her chest pain is improved.  Denies any headaches.  No blood in stools or black stools.  ROS: A complete 10 point review of system is done which is negative except mentioned above in history of present illness  MEDICAL HISTORY:  Past Medical History:  Diagnosis Date  . Atrial fibrillation ([place])   . Breast cancer ([place]) 1998  . Cancer of breast ([place]) 1998   Left/radiation  . CINV (chemotherapy-induced nausea and vomiting)   . GERD (gastroesophageal reflux disease)   . Hyperlipidemia   . Hypothyroidism   . Mitral insufficiency   . Ovarian cancer ([place])    s/p BSO optimal tumor debulking May 2016/chemo  . Personal history of chemotherapy since 2016   ovarian cancer  . Personal history of radiation therapy 1998  . PVC (premature ventricular contraction)     SURGICAL HISTORY: Past Surgical History:  Procedure Laterality Date  . ABDOMINAL HYSTERECTOMY    .  BILATERAL SALPINGOOPHORECTOMY  May 2016   with optimal tumor debulking   . BREAST LUMPECTOMY Left 1998  . BREAST SURGERY     Left  . CESAREAN SECTION     x2  . CHOLECYSTECTOMY    . COLONOSCOPY  2006  . DEBULKING N/A 12/22/2014   Procedure: DEBULKING;  Surgeon: Gillis Ends, MD;  Location: [facility] ORS;  Service: Gynecology;  Laterality: N/A;  . LAPAROTOMY N/A 12/22/2014   Procedure: EXPLORATORY LAPAROTOMY;  Surgeon: Robert Bellow, MD;  Location: [facility] ORS;  Service: General;  Laterality: N/A;  . LAPAROTOMY WITH STAGING N/A 12/22/2014   Procedure: LAPAROTOMY WITH STAGING;  Surgeon: Gillis Ends, MD;  Location: [facility] ORS;  Service: Gynecology;  Laterality: N/A;  . PERIPHERAL VASCULAR CATHETERIZATION Left 12/22/2014   Procedure: PORTA CATH INSERTION;  Surgeon: Robert Bellow, MD;  Location: [facility] ORS;  Service: General;  Laterality: Left;  . PORTACATH PLACEMENT Right 12/22/2014   Procedure: INSERTION PORT-A-CATH;  Surgeon: Robert Bellow, MD;  Location: [facility] ORS;  Service: General;  Laterality: Right;  . SALPINGOOPHORECTOMY Bilateral 12/22/2014   Procedure: SALPINGO OOPHORECTOMY;  Surgeon: Gillis Ends, MD;  Location: [facility] ORS;  Service: Gynecology;  Laterality: Bilateral;  . UPPER GI ENDOSCOPY  09/25/04   hiatus hernia, schatzki ring and a single gastric polyp    SOCIAL HISTORY: Social History   Socioeconomic History  . Marital status: Married    Spouse name: Not on file  . Number of children: Not on file  . Years of education: Not on file  .  Highest education level: Not on file  Occupational History  . Not on file  Social Needs  . Financial resource strain: Not on file  . Food insecurity:    Worry: Not on file    Inability: Not on file  . Transportation needs:    Medical: Not on file    Non-medical: Not on file  Tobacco Use  . Smoking status: Never Smoker  . Smokeless tobacco: Never Used  Substance and Sexual Activity  . Alcohol use: No  . Drug use:  No  . Sexual activity: Not Currently  Lifestyle  . Physical activity:    Days per week: Not on file    Minutes per session: Not on file  . Stress: Not on file  Relationships  . Social connections:    Talks on phone: Not on file    Gets together: Not on file    Attends religious service: Not on file    Active member of club or organization: Not on file    Attends meetings of clubs or organizations: Not on file    Relationship status: Not on file  . Intimate partner violence:    Fear of current or ex partner: Not on file    Emotionally abused: Not on file    Physically abused: Not on file    Forced sexual activity: Not on file  Other Topics Concern  . Not on file  Social History Narrative  . Not on file    FAMILY HISTORY: Family History  Problem Relation Age of Onset  . Cancer Mother        breast, throat, and stomach  . Breast cancer Mother   . Heart disease Father   . Pulmonary embolism Sister   . Cancer Brother 60       angiosarcoma of the chest; carcinoid small intestinal tumor  . Hypertension Brother   . Cancer Maternal Aunt        breast cancer  . Cancer Maternal Grandfather 50       pancreatic    ALLERGIES:  is allergic to carafate [sucralfate] and sulfa antibiotics.  MEDICATIONS:  Current Facility-Administered Medications  Medication Dose Route Frequency Provider Last Rate Last Dose  . 0.9 %  sodium chloride infusion   Intravenous Continuous Nena Polio, MD 10 mL/hr at 12/10/17 1044 1,000 mL at 12/10/17 1044  . 0.9 %  sodium chloride infusion  250 mL Intravenous PRN Callwood, Dwayne D, MD      . 0.9 %  sodium chloride infusion   Intravenous Continuous Cammie Sickle, MD 75 mL/hr at 12/11/17 0944    . acetaminophen (TYLENOL) tablet 650 mg  650 mg Oral Q6H PRN Bettey Costa, MD       Or  . acetaminophen (TYLENOL) suppository 650 mg  650 mg Rectal Q6H PRN Mody, Sital, MD      . alum & mag hydroxide-simeth (MAALOX/MYLANTA) 200-200-20 MG/5ML suspension  15 mL  15 mL Oral Q6H PRN Cammie Sickle, MD   15 mL at 12/11/17 0944  . cholecalciferol (VITAMIN D) tablet 2,000 Units  2,000 Units Oral Daily Mody, Sital, MD      . enoxaparin (LOVENOX) injection 85 mg  85 mg Subcutaneous Q12H Mody, Sital, MD   85 mg at 12/11/17 0403  . levothyroxine (SYNTHROID, LEVOTHROID) tablet 50 mcg  50 mcg Oral QAC breakfast Bettey Costa, MD   50 mcg at 12/11/17 0833  . lisinopril (PRINIVIL,ZESTRIL) tablet 10 mg  10 mg Oral Daily Mody,  Sital, MD      . LORazepam (ATIVAN) tablet 0.25 mg  0.25 mg Oral QHS Mody, Sital, MD   0.25 mg at 12/10/17 2202  . ondansetron (ZOFRAN) tablet 4 mg  4 mg Oral Q6H PRN Bettey Costa, MD       Or  . ondansetron (ZOFRAN) injection 4 mg  4 mg Intravenous Q6H PRN Bettey Costa, MD   4 mg at 12/11/17 0212  . pantoprazole (PROTONIX) EC tablet 40 mg  40 mg Oral BID AC Wilhelmina Mcardle, MD   40 mg at 12/11/17 6294  . polyethylene glycol (MIRALAX / GLYCOLAX) packet 17 g  17 g Oral Daily PRN Mody, Sital, MD      . propafenone (RYTHMOL) tablet 225 mg  225 mg Oral BID Bettey Costa, MD   225 mg at 12/11/17 0944  . sodium chloride flush (NS) 0.9 % injection 3 mL  3 mL Intravenous Q12H Callwood, Dwayne D, MD   3 mL at 12/11/17 0945  . sodium chloride flush (NS) 0.9 % injection 3 mL  3 mL Intravenous PRN Callwood, Dwayne D, MD      . traMADol (ULTRAM) tablet 50 mg  50 mg Oral Q6H PRN Bettey Costa, MD       Facility-Administered Medications Ordered in Other Encounters  Medication Dose Route Frequency Provider Last Rate Last Dose  . sodium chloride 0.9 % injection 10 mL  10 mL Intracatheter PRN Forest Gleason, MD   10 mL at 02/07/15 0930      .  PHYSICAL EXAMINATION:  Vitals:   12/11/17 0805 12/11/17 1619  BP: 109/70 97/62  Pulse: (!) 109 87  Resp:    Temp: 97.9 F (36.6 C) 98.4 F (36.9 C)  SpO2: 96% 96%   Filed Weights   12/10/17 1033 12/10/17 1200 12/10/17 1955  Weight: 187 lb (84.8 kg) 188 lb 15 oz (85.7 kg) 186 lb 8 oz (84.6 kg)     GENERAL: Well-nourished well-developed; Alert, no distress and comfortable.   Accompanied by daughter and husband.  Feels poorly EYES: no pallor or icterus OROPHARYNX: no thrush or ulceration. NECK: supple, no masses felt LYMPH:  no palpable lymphadenopathy in the cervical, axillary or inguinal regions LUNGS: decreased breath sounds to auscultation at bases and  No wheeze or crackles HEART/CVS: regular rate & rhythm and no murmurs; No lower extremity edema ABDOMEN: abdomen soft, non-tender and normal bowel sounds; epigastric tenderness on deep palpation. Musculoskeletal:no cyanosis of digits and no clubbing  PSYCH: alert & oriented x 3 with fluent speech NEURO: no focal motor/sensory deficits SKIN:  no rashes or significant lesions  LABORATORY DATA:  I have reviewed the data as listed Lab Results  Component Value Date   WBC 9.0 12/10/2017   HGB 15.6 12/10/2017   HCT 46.5 12/10/2017   MCV 96.2 12/10/2017   PLT 232 12/10/2017   Recent Labs    10/28/17 0903 11/18/17 1010 12/10/17 1040 12/10/17 1259  NA 139 136 134*  --   K 3.8 4.5 4.2  --   CL 107 103 99*  --   CO2 22 23 26   --   GLUCOSE 117* 81 117*  --   BUN 18 16 19   --   CREATININE 1.14* 1.05* 1.27* 1.11*  CALCIUM 9.3 9.6 9.9  --   GFRNONAA 46* 51* 41* 48*  GFRAA 54* 59* 47* 55*  PROT 6.7 6.8 7.7  --   ALBUMIN 3.5 3.6 4.2  --   AST 31 25 41  --  ALT 15 16 28   --   ALKPHOS 62 64 89  --   BILITOT 0.6 0.6 0.8  --     RADIOGRAPHIC STUDIES: I have personally reviewed the radiological images as listed and agreed with the findings in the report. Ct Angio Chest Pe W Or Wo Contrast  Result Date: 12/11/2017 CLINICAL DATA:  Chest pain, shortness of breath EXAM: CT ANGIOGRAPHY CHEST WITH CONTRAST TECHNIQUE: Multidetector CT imaging of the chest was performed using the standard protocol during bolus administration of intravenous contrast. Multiplanar CT image reconstructions and MIPs were obtained to evaluate the  vascular anatomy. CONTRAST:  28mL ISOVUE-370 IOPAMIDOL (ISOVUE-370) INJECTION 76% COMPARISON:  None. FINDINGS: Cardiovascular: Satisfactory opacification of the pulmonary arteries to the segmental level. No evidence of pulmonary embolism. Dilated main artery as can be seen with pulmonary arterial hypertension normal heart size. No pericardial effusion. Thoracic aortic atherosclerosis. Mediastinum/Nodes: No enlarged mediastinal, hilar, or axillary lymph nodes. Thyroid gland, trachea, and esophagus demonstrate no significant findings. Lungs/Pleura: Mild bibasilar atelectasis. No pleural effusion or pneumothorax. No consolidation. Upper Abdomen: No acute upper abdominal abnormality. 3.8 cm hypodense right hepatic mass unchanged compared with 10/23/2017 at which time the appearance was most compatible with a hemangioma. Musculoskeletal: No acute osseous abnormality. No aggressive osseous lesion. Review of the MIP images confirms the above findings. IMPRESSION: 1. No evidence of pulmonary embolus. 2. No acute cardiopulmonary disease. 3. Aortic Atherosclerosis (ICD10-I70.0). Electronically Signed   By: Kathreen Devoid   On: 12/11/2017 11:00   Dg Chest Portable 1 View  Result Date: 12/10/2017 CLINICAL DATA:  Myocardial infarct. History of ovarian carcinoma. History of breast carcinoma EXAM: PORTABLE CHEST 1 VIEW COMPARISON:  Chest radiograph July 05, 2016 PET-CT November 04, 2017 FINDINGS: There is no edema or consolidation. Heart is upper normal in size with pulmonary vascularity within normal limits. There is aortic atherosclerosis. Port-A-Cath tip is in the superior vena cava. No pneumothorax. No evident bone lesions. IMPRESSION: No edema or consolidation. Heart upper normal in size. There is aortic atherosclerosis. Aortic Atherosclerosis (ICD10-I70.0). Electronically Signed   By: Lowella Grip III M.D.   On: 12/10/2017 11:06    ASSESSMENT & PLAN:   #77 year old female patient with a history of recurrent  ovarian cancer-is currently admitted the hospital for chest pain.  #Recurrent platinum refractory ovarian cancer-most currently on Zejula [PARP inhibitor].  It is not clear if Zejula caused any of patient's current symptoms-of chest pain.  Hold Zejula for now.  Discussed that other PARP inhibitors is an option. Also discussed with Dr.Secord.   #Nausea with vomiting-gastritis versus others.  Continue PPI; add Maalox.  LFTs normal/add lipase amylase recommend adding IV fluids.  # Chest pain-unclear etiology; status post cardiac cath negative; decreased ejection fraction.  Question vasospasm versus others.  CT scan also negative for PE.  Can discontinue therapeutic dose of Lovenox.  Continue DVT prophylaxis.  # Thank you Dr. Margaretmary Eddy for allowing me to participate in the care of your pleasant patient. Please do not hesitate to contact me with questions or concerns in the interim. Discussed with Dr.Gouru.   All questions were answered. The patient knows to call the clinic with any problems, questions or concerns.   Cammie Sickle, MD 12/11/2017 5:04 PM

## 2017-12-12 ENCOUNTER — Encounter: Payer: Self-pay | Admitting: Internal Medicine

## 2017-12-12 DIAGNOSIS — R1013 Epigastric pain: Secondary | ICD-10-CM

## 2017-12-12 LAB — BASIC METABOLIC PANEL
ANION GAP: 7 (ref 5–15)
BUN: 15 mg/dL (ref 6–20)
CHLORIDE: 104 mmol/L (ref 101–111)
CO2: 23 mmol/L (ref 22–32)
Calcium: 8.5 mg/dL — ABNORMAL LOW (ref 8.9–10.3)
Creatinine, Ser: 1.14 mg/dL — ABNORMAL HIGH (ref 0.44–1.00)
GFR calc non Af Amer: 46 mL/min — ABNORMAL LOW (ref >60)
GFR, EST AFRICAN AMERICAN: 54 mL/min — AB (ref >60)
Glucose, Bld: 103 mg/dL — ABNORMAL HIGH (ref 65–99)
POTASSIUM: 3.8 mmol/L (ref 3.5–5.1)
Sodium: 134 mmol/L — ABNORMAL LOW (ref 135–145)

## 2017-12-12 LAB — CBC
HEMATOCRIT: 39.5 % (ref 35.0–47.0)
HEMOGLOBIN: 13.5 g/dL (ref 12.0–16.0)
MCH: 32.7 pg (ref 26.0–34.0)
MCHC: 34.3 g/dL (ref 32.0–36.0)
MCV: 95.4 fL (ref 80.0–100.0)
Platelets: 196 10*3/uL (ref 150–440)
RBC: 4.14 MIL/uL (ref 3.80–5.20)
RDW: 13.7 % (ref 11.5–14.5)
WBC: 9.5 10*3/uL (ref 3.6–11.0)

## 2017-12-12 LAB — CARDIAC CATHETERIZATION: Cath EF Quantitative: 35 %

## 2017-12-12 LAB — TROPONIN I: TROPONIN I: 1.46 ng/mL — AB (ref <0.03)

## 2017-12-12 MED ORDER — PANTOPRAZOLE SODIUM 40 MG PO TBEC: 40.0000 mg | DELAYED_RELEASE_TABLET | Freq: Every day | ORAL | 1 refills | Status: DC

## 2017-12-12 MED ORDER — ATORVASTATIN CALCIUM 40 MG PO TABS: 40.0000 mg | ORAL_TABLET | Freq: Every day | ORAL | 0 refills | Status: DC

## 2017-12-12 MED ORDER — ASPIRIN EC 325 MG PO TBEC: 325.0000 mg | DELAYED_RELEASE_TABLET | Freq: Every day | ORAL | 3 refills | Status: DC

## 2017-12-12 MED ORDER — LISINOPRIL 10 MG PO TABS: 10.0000 mg | ORAL_TABLET | Freq: Every day | ORAL | 4 refills | Status: DC

## 2017-12-12 MED ORDER — ALUM & MAG HYDROXIDE-SIMETH 200-200-20 MG/5ML PO SUSP: 15.0000 mL | Freq: Four times a day (QID) | ORAL | 0 refills | Status: AC | PRN

## 2017-12-12 MED ORDER — ATORVASTATIN CALCIUM 20 MG PO TABS: 40.0000 mg | ORAL_TABLET | Freq: Every day | ORAL | Status: DC

## 2017-12-12 NOTE — Discharge Instructions (Signed)
Follow-up with primary care physician in 1 week Follow-up with cardiology Dr. Nehemiah Massed in 1 week Follow-up with oncology to be in 2 weeks follow-up with gastroenterology Dr. Vira Agar in 2 to 3 weeks

## 2017-12-12 NOTE — Discharge Summary (Addendum)
[place] [facility] [place] NAME: Jamie Conner    MR#:  [id number]  DATE OF BIRTH:  1941-01-15  DATE OF ADMISSION:  12/10/2017 ADMITTING PHYSICIAN: Yolonda Kida, MD  DATE OF DISCHARGE: 12/12/17   PRIMARY CARE PHYSICIAN: Jerrol Banana., MD    ADMISSION DIAGNOSIS:  ST elevation myocardial infarction (STEMI), unspecified artery ([place]) [I21.3] STEMI (ST elevation myocardial infarction) ([place]) [I21.3]  DISCHARGE DIAGNOSIS:  Active Problems:   Chest pain   STEMI (ST elevation myocardial infarction) (H[facility]) Nausea and vomiting resolved  SECONDARY DIAGNOSIS:   Past Medical History:  Diagnosis Date  . Atrial fibrillation ([place])   . Breast cancer ([place]) 1998  . Cancer of breast ([place]) 1998   Left/radiation  . CINV (chemotherapy-induced nausea and vomiting)   . GERD (gastroesophageal reflux disease)   . Hyperlipidemia   . Hypothyroidism   . Mitral insufficiency   . Ovarian cancer ([place])    s/p BSO optimal tumor debulking May 2016/chemo  . Personal history of chemotherapy since 2016   ovarian cancer  . Personal history of radiation therapy 1998  . PVC (premature ventricular contraction)     HOSPITAL COURSE:    1. Chest pain 2/2 stemi :  She had cath by Dr Clayborn Bigness had no significant blockage of coronary arteries. Management as per Dr. Dolores Hoose  ECHO-LV ejection fraction 30 to 35%  Moderate to severely depressed LV function Follow troponins 4.00-4.15-1.86-1.46 Patient was  on therapeutic dose Lovenox, d/ced lovenox as CTA chest negative for pulmonary embolism Monitored on telemetry Will discharge patient with aspirin and statin  continue home medication lisinopril and could not add a beta-blocker in view of soft blood pressure Okay to discharge patient from cardiology [facility] standpoint, could'nt reach dr.Callwood  2.   nausea and vomiting resolved with antiemetics and supportive treatment  3. HTN: resume  lisinopril 10 mg, could not add a beta-blocker in view of soft blood pressure PCP can consider resuming lisinopril during follow-up visit if the blood pressure is stable   4. History of ovarian cancer: Patient is followed by Dr. Rogue Bussing, seen by  Dr. Jacinto Reap, holding Zejula for now  5.Persistent A fib: ContinueRythmol   6. Hypothyroidism: Continue Synthroid   DISCHARGE CONDITIONS:   Stable  CONSULTS OBTAINED:  Treatment Team:  Cammie Sickle, MD Yolonda Kida, MD   PROCEDURES cardiac cath on 12/10/2017  DRUG ALLERGIES:   Allergies  Allergen Reactions  . Carafate [Sucralfate] Rash  . Sulfa Antibiotics Rash    DISCHARGE MEDICATIONS:   Allergies as of 12/12/2017      Reactions   Carafate [sucralfate] Rash   Sulfa Antibiotics Rash      Medication List    STOP taking these medications   lactulose 10 GM/15ML solution Commonly known as:  CHRONULAC   Niraparib Tosylate 100 MG Caps   polyethylene glycol powder powder Commonly known as:  GLYCOLAX/MIRALAX   prochlorperazine 10 MG tablet Commonly known as:  COMPAZINE     TAKE these medications   ABREVA EX Apply 1 application topically as needed. Fever blisters   alum & mag hydroxide-simeth 200-200-20 MG/5ML suspension Commonly known as:  MAALOX/MYLANTA Take 15 mLs by mouth every 6 (six) hours as needed for indigestion or heartburn.   aspirin EC 325 MG tablet Take 1 tablet (325 mg total) by mouth daily. Notes to patient:  Take with food   atorvastatin 40 MG tablet Commonly known as:  LIPITOR Take 1 tablet (40 mg  total) by mouth daily at 6 PM.   levothyroxine 50 MCG tablet Commonly known as:  SYNTHROID, LEVOTHROID TAKE 1 TABLET EVERY DAY ON AN EMPTY STOMACH WITH A GLASS OF WATER AT LEAST 30-60 MINUTES BEFORE BREAKFAST   lidocaine-prilocaine cream Commonly known as:  EMLA Apply to port area 30-45 mins prior to chemo.   lisinopril 10 MG tablet Commonly known as:  PRINIVIL,ZESTRIL Take 1  tablet (10 mg total) by mouth daily.   LORazepam 0.5 MG tablet Commonly known as:  ATIVAN Take 0.5 tablets (0.25 mg total) by mouth at bedtime.   Lysine 500 MG Tabs Take 1 tablet by mouth daily as needed.   magic mouthwash w/lidocaine Soln Take 5 mLs by mouth every 4 (four) hours as needed.   multivitamin capsule Take 1 capsule by mouth daily.   ondansetron 4 MG tablet Commonly known as:  ZOFRAN One pill 30-60 mins prior to taking zejula to prevent nausea/vomitting.   pantoprazole 40 MG tablet Commonly known as:  PROTONIX Take 1 tablet (40 mg total) by mouth daily.   propafenone 225 MG tablet Commonly known as:  RYTHMOL Take 225 mg by mouth 2 (two) times daily.   traMADol 50 MG tablet Commonly known as:  ULTRAM Take 1 tablet (50 mg total) by mouth every 6 (six) hours as needed.   Vitamin D 2000 units tablet Take 1 tablet (2,000 Units total) by mouth daily.        DISCHARGE INSTRUCTIONS:  Follow-up with primary care physician in 1 week Follow-up with cardiology Dr. Nehemiah Massed in 1 week Follow-up with oncology to be in 2 weeks follow-up with gastroenterology Dr. Vira Agar in 2 to 3 weeks    DIET:  Cardiac diet  DISCHARGE CONDITION:  Stable  ACTIVITY:  Activity as tolerated  OXYGEN:  Home Oxygen: No.   Oxygen Delivery: room air  DISCHARGE LOCATION:  home   If you experience worsening of your admission symptoms, develop shortness of breath, life threatening emergency, suicidal or homicidal thoughts you must seek medical attention immediately by calling 911 or calling your MD immediately  if symptoms less severe.  You Must read complete instructions/literature along with all the possible adverse reactions/side effects for all the Medicines you take and that have been prescribed to you. Take any new Medicines after you have completely understood and accpet all the possible adverse reactions/side effects.   Please note  You were cared for by a hospitalist  during your hospital stay. If you have any questions about your discharge medications or the care you received while you were in the hospital after you are discharged, you can call the unit and asked to speak with the hospitalist on call if the hospitalist that took care of you is not available. Once you are discharged, your primary care physician will handle any further medical issues. Please note that NO REFILLS for any discharge medications will be authorized once you are discharged, as it is imperative that you return to your primary care physician (or establish a relationship with a primary care physician if you do not have one) for your aftercare needs so that they can reassess your need for medications and monitor your lab values.     Today  Chief Complaint  Patient presents with  . Chest Pain   Patient is doing fine denies any chest pain shortness of breath.  Nausea vomiting completely resolved and tolerating diet.  Okay to discharge patient from cardiology Dr. Saralyn Pilar was standpoint Lovenox therapeutic dose discontinued  ROS:  CONSTITUTIONAL: Denies fevers, chills. Denies any fatigue, weakness.  EYES: Denies blurry vision, double vision, eye pain. EARS, NOSE, THROAT: Denies tinnitus, ear pain, hearing loss. RESPIRATORY: Denies cough, wheeze, shortness of breath.  CARDIOVASCULAR: Denies chest pain, palpitations, edema.  GASTROINTESTINAL: Denies nausea, vomiting, diarrhea, abdominal pain. Denies bright red blood per rectum. GENITOURINARY: Denies dysuria, hematuria. ENDOCRINE: Denies nocturia or thyroid problems. HEMATOLOGIC AND LYMPHATIC: Denies easy bruising or bleeding. SKIN: Denies rash or lesion. MUSCULOSKELETAL: Denies pain in neck, back, shoulder, knees, hips or arthritic symptoms.  NEUROLOGIC: Denies paralysis, paresthesias.  PSYCHIATRIC: Denies anxiety or depressive symptoms.   VITAL SIGNS:  Blood pressure 122/80, pulse (!) 102, temperature 97.9 F (36.6 C), resp. rate  14, height 5\' 4"  (1.626 m), weight 84.6 kg (186 lb 8 oz), SpO2 95 %.  I/O:    Intake/Output Summary (Last 24 hours) at 12/12/2017 1220 Last data filed at 12/12/2017 0809 Gross per 24 hour  Intake 909.5 ml  Output 900 ml  Net 9.5 ml    PHYSICAL EXAMINATION:  GENERAL:  77 y.o.-year-old patient lying in the bed with no acute distress.  EYES: Pupils equal, round, reactive to light and accommodation. No scleral icterus. Extraocular muscles intact.  HEENT: Head atraumatic, normocephalic. Oropharynx and nasopharynx clear.  NECK:  Supple, no jugular venous distention. No thyroid enlargement, no tenderness.  LUNGS: Normal breath sounds bilaterally, no wheezing, rales,rhonchi or crepitation. No use of accessory muscles of respiration.  CARDIOVASCULAR: S1, S2 normal. No murmurs, rubs, or gallops.  ABDOMEN: Soft, non-tender, non-distended. Bowel sounds present EXTREMITIES: Right groin area cath site intact no pedal edema, cyanosis, or clubbing.  NEUROLOGIC: Cranial nerves II through XII are intact. Muscle strength 5/5 in all extremities. Sensation intact. Gait not checked.  PSYCHIATRIC: The patient is alert and oriented x 3.  SKIN: No obvious rash, lesion, or ulcer.   DATA REVIEW:   CBC Recent Labs  Lab 12/12/17 0500  WBC 9.5  HGB 13.5  HCT 39.5  PLT 196    Chemistries  Recent Labs  Lab 12/10/17 1040  12/12/17 0500  NA 134*  --  134*  K 4.2  --  3.8  CL 99*  --  104  CO2 26  --  23  GLUCOSE 117*  --  103*  BUN 19  --  15  CREATININE 1.27*   < > 1.14*  CALCIUM 9.9  --  8.5*  AST 41  --   --   ALT 28  --   --   ALKPHOS 89  --   --   BILITOT 0.8  --   --    < > = values in this interval not displayed.    Cardiac Enzymes Recent Labs  Lab 12/12/17 0500  TROPONINI 1.46*    Microbiology Results  Results for orders placed or performed during the hospital encounter of 12/10/17  MRSA PCR Screening     Status: None   Collection Time: 12/10/17 12:28 PM  Result Value Ref  Range Status   MRSA by PCR NEGATIVE NEGATIVE Final    Comment:        The GeneXpert MRSA Assay (FDA approved for NASAL specimens only), is one component of a comprehensive MRSA colonization surveillance program. It is not intended to diagnose MRSA infection nor to guide or monitor treatment for MRSA infections. Performed at [facility], [address], [place], [place] [address]     RADIOLOGY:  Ct Angio Chest Pe W Or Wo Contrast  Result  Date: 12/11/2017 CLINICAL DATA:  Chest pain, shortness of breath EXAM: CT ANGIOGRAPHY CHEST WITH CONTRAST TECHNIQUE: Multidetector CT imaging of the chest was performed using the standard protocol during bolus administration of intravenous contrast. Multiplanar CT image reconstructions and MIPs were obtained to evaluate the vascular anatomy. CONTRAST:  36mL ISOVUE-370 IOPAMIDOL (ISOVUE-370) INJECTION 76% COMPARISON:  None. FINDINGS: Cardiovascular: Satisfactory opacification of the pulmonary arteries to the segmental level. No evidence of pulmonary embolism. Dilated main artery as can be seen with pulmonary arterial hypertension normal heart size. No pericardial effusion. Thoracic aortic atherosclerosis. Mediastinum/Nodes: No enlarged mediastinal, hilar, or axillary lymph nodes. Thyroid gland, trachea, and esophagus demonstrate no significant findings. Lungs/Pleura: Mild bibasilar atelectasis. No pleural effusion or pneumothorax. No consolidation. Upper Abdomen: No acute upper abdominal abnormality. 3.8 cm hypodense right hepatic mass unchanged compared with 10/23/2017 at which time the appearance was most compatible with a hemangioma. Musculoskeletal: No acute osseous abnormality. No aggressive osseous lesion. Review of the MIP images confirms the above findings. IMPRESSION: 1. No evidence of pulmonary embolus. 2. No acute cardiopulmonary disease. 3. Aortic Atherosclerosis (ICD10-I70.0). Electronically Signed   By: Kathreen Devoid   On: 12/11/2017 11:00    Dg Chest Portable 1 View  Result Date: 12/10/2017 CLINICAL DATA:  Myocardial infarct. History of ovarian carcinoma. History of breast carcinoma EXAM: PORTABLE CHEST 1 VIEW COMPARISON:  Chest radiograph July 05, 2016 PET-CT November 04, 2017 FINDINGS: There is no edema or consolidation. Heart is upper normal in size with pulmonary vascularity within normal limits. There is aortic atherosclerosis. Port-A-Cath tip is in the superior vena cava. No pneumothorax. No evident bone lesions. IMPRESSION: No edema or consolidation. Heart upper normal in size. There is aortic atherosclerosis. Aortic Atherosclerosis (ICD10-I70.0). Electronically Signed   By: Lowella Grip III M.D.   On: 12/10/2017 11:06    EKG:   Orders placed or performed during the hospital encounter of 12/10/17  . EKG 12-Lead  . EKG 12-Lead  . EKG      Management plans discussed with the patient, family and they are in agreement.  CODE STATUS:     Code Status Orders  (From admission, onward)        Start     Ordered   12/10/17 1139  Full code  Continuous     12/10/17 1140    Code Status History    Date Active Date Inactive Code Status Order ID Comments User Context   04/11/2015 0133 04/11/2015 1613 Full Code [id number]  Juluis Mire, MD Inpatient   12/22/2014 1718 12/26/2014 1527 Full Code [id number]  Robert Bellow, MD Inpatient    Advance Directive Documentation     Most Recent Value  Type of Advance Directive  Living will  Pre-existing out of facility DNR order (yellow form or pink MOST form)  -  "MOST" Form in Place?  -      TOTAL TIME TAKING CARE OF THIS PATIENT: 43  minutes.   Note: This dictation was prepared with Dragon dictation along with smaller phrase technology. Any transcriptional errors that result from this process are unintentional.   @[facility] @  on 12/12/2017 at 12:20 PM  Between 7am to 6pm - Pager - [phone]  After 6pm go to [email or web address] - password [email or web address] [facility]  [place]  Office  [phone]  CC: Primary care physician; Jerrol Banana., MD

## 2017-12-12 NOTE — Consult Note (Signed)
Reason for Consult: STEMI presentation chest pain Referring Physician: Dr. Margaretmary Eddy hospitalist Dr. Miguel Aschoff primary  Jamie Conner is an 77 y.o. female.  HPI: Patient started having acute chest pain symptoms EKG was thought to be abnormal with a STEMI presentation emergency room thought to be inferior patient had severe persistent chest discomfort the cardiology was consulted for possible STEMI.  Code STEMI was initiated after evaluation of EKG by cardiology EKG appeared to be equivocal so the possibility of significant STEMI was significantly reduced in my opinion but the patient was taken to the Cath Lab nonetheless because of persistent pain and abnormal EKG.. Patient had persistent atrial fibrillation on Rythmol hypertension on lisinopril she is been treated for ovarian cancer by Dr. Rogue Bussing patient had surgery by physician at [facility] now here with chest pain discomfort  Past Medical History:  Diagnosis Date  . Atrial fibrillation ([place])   . Breast cancer ([place]) 1998  . Cancer of breast ([place]) 1998   Left/radiation  . CINV (chemotherapy-induced nausea and vomiting)   . GERD (gastroesophageal reflux disease)   . Hyperlipidemia   . Hypothyroidism   . Mitral insufficiency   . Ovarian cancer ([place])    s/p BSO optimal tumor debulking May 2016/chemo  . Personal history of chemotherapy since 2016   ovarian cancer  . Personal history of radiation therapy 1998  . PVC (premature ventricular contraction)     Past Surgical History:  Procedure Laterality Date  . ABDOMINAL HYSTERECTOMY    . BILATERAL SALPINGOOPHORECTOMY  May 2016   with optimal tumor debulking   . BREAST LUMPECTOMY Left 1998  . BREAST SURGERY     Left  . CESAREAN SECTION     x2  . CHOLECYSTECTOMY    . COLONOSCOPY  2006  . DEBULKING N/A 12/22/2014   Procedure: DEBULKING;  Surgeon: Gillis Ends, MD;  Location: [facility] ORS;  Service: Gynecology;  Laterality: N/A;  . LAPAROTOMY N/A 12/22/2014   Procedure: EXPLORATORY  LAPAROTOMY;  Surgeon: Robert Bellow, MD;  Location: [facility] ORS;  Service: General;  Laterality: N/A;  . LAPAROTOMY WITH STAGING N/A 12/22/2014   Procedure: LAPAROTOMY WITH STAGING;  Surgeon: Gillis Ends, MD;  Location: [facility] ORS;  Service: Gynecology;  Laterality: N/A;  . PERIPHERAL VASCULAR CATHETERIZATION Left 12/22/2014   Procedure: PORTA CATH INSERTION;  Surgeon: Robert Bellow, MD;  Location: [facility] ORS;  Service: General;  Laterality: Left;  . PORTACATH PLACEMENT Right 12/22/2014   Procedure: INSERTION PORT-A-CATH;  Surgeon: Robert Bellow, MD;  Location: [facility] OR[facility];  Service: General;  Laterality: Right;  . SALPINGOOPHORECTOMY Bilateral 12/22/2014   Procedure: SALPINGO OOPHORECTOMY;  Surgeon: Gillis Ends, MD;  Location: [facility] OR[facility];  Service: Gynecology;  Laterality: Bilateral;  . UPPER GI ENDOSCOPY  09/25/04   hiatus hernia, schatzki ring and a single gastric polyp    Family History  Problem Relation Age of Onset  . Cancer Mother        breast, throat, and stomach  . Breast cancer Mother   . Heart disease Father   . Pulmonary embolism Sister   . Cancer Brother 60       angiosarcoma of the chest; carcinoid small intestinal tumor  . Hypertension Brother   . Cancer Maternal Aunt        breast cancer  . Cancer Maternal Grandfather 64       pancreatic    Social History:  reports that she has never smoked. She has never used smokeless tobacco. She reports  that she does not drink alcohol or use drugs.  Allergies:  Allergies  Allergen Reactions  . Carafate [Sucralfate] Rash  . Sulfa Antibiotics Rash    Medications: I have reviewed the patient's current medications.  Results for orders placed or performed during the hospital encounter of 12/10/17 (from the past 48 hour(s))  CBC with Differential/Platelet     Status: Abnormal   Collection Time: 12/10/17 10:40 AM  Result Value Ref Range   WBC 9.8 3.6 - 11.0 K/uL   RBC 4.95 3.80 - 5.20 MIL/uL   Hemoglobin 16.1  (H) 12.0 - 16.0 g/dL   HCT 46.9 35.0 - 47.0 %   MCV 94.9 80.0 - 100.0 fL   MCH 32.5 26.0 - 34.0 pg   MCHC 34.3 32.0 - 36.0 g/dL   RDW 13.6 11.5 - 14.5 %   Platelets 277 150 - 440 K/uL   Neutrophils Relative % 34 %   Lymphocytes Relative 52 %   Monocytes Relative 11 %   Eosinophils Relative 2 %   Basophils Relative 1 %   Neutro Abs 3.3 1.4 - 6.5 K/uL   Lymphs Abs 5.1 (H) 1.0 - 3.6 K/uL   Monocytes Absolute 1.1 (H) 0.2 - 0.9 K/uL   Eosinophils Absolute 0.2 0 - 0.7 K/uL   Basophils Absolute 0.1 0 - 0.1 K/uL   WBC Morphology ATYPICAL LYMPHOCYTES     Comment: Performed at [facility], [address] [place], [place] [address]  Protime-INR     Status: None   Collection Time: 12/10/17 10:40 AM  Result Value Ref Range   Prothrombin Time 11.7 11.4 - 15.2 seconds   INR 0.87     Comment: Performed at [facility], [address] [place], [place] [address]  APTT     Status: None   Collection Time: 12/10/17 10:40 AM  Result Value Ref Range   aPTT 28 24 - 36 seconds    Comment: Performed at [facility], [address] [place], [place] [address]  Comprehensive metabolic panel     Status: Abnormal   Collection Time: 12/10/17 10:40 AM  Result Value Ref Range   Sodium 134 (L) 135 - 145 mmol/L   Potassium 4.2 3.5 - 5.1 mmol/L   Chloride 99 (L) 101 - 111 mmol/L   CO2 26 22 - 32 mmol/L   Glucose, Bld 117 (H) 65 - 99 mg/dL   BUN 19 6 - 20 mg/dL   Creatinine, Ser 1.27 (H) 0.44 - 1.00 mg/dL   Calcium 9.9 8.9 - 10.3 mg/dL   Total Protein 7.7 6.5 - 8.1 g/dL   Albumin 4.2 3.5 - 5.0 g/dL   AST 41 15 - 41 U/L   ALT 28 14 - 54 U/L   Alkaline Phosphatase 89 38 - 126 U/L   Total Bilirubin 0.8 0.3 - 1.2 mg/dL   GFR calc non Af Amer 41 (L) >60 mL/min   GFR calc Af Amer 47 (L) >60 mL/min    Comment: (NOTE) The eGFR has been calculated using the CKD EPI equation. This calculation has not been validated in all clinical situations. eGFR's persistently <60 mL/min  signify possible Chronic Kidney Disease.    Anion gap 9 5 - 15    Comment: Performed at [facility], [address] [place], [place] [address]  Troponin I     Status: Abnormal   Collection Time: 12/10/17 10:40 AM  Result Value Ref Range   Troponin I 0.09 (HH) <0.03 ng/mL  Comment: CRITICAL RESULT CALLED TO, READ BACK BY AND VERIFIED WITH JACKIE VICKERSON '@1113'$  12/10/17 [facility] Performed at [facility], [address] [place], [place] [address]   Lipid panel     Status: Abnormal   Collection Time: 12/10/17 10:40 AM  Result Value Ref Range   Cholesterol 296 (H) 0 - 200 mg/dL   Triglycerides 156 (H) <150 mg/dL   HDL 63 >40 mg/dL   Total CHOL/HDL Ratio 4.7 RATIO   VLDL 31 0 - 40 mg/dL   LDL Cholesterol 202 (H) 0 - 99 mg/dL    Comment:        Total Cholesterol/HDL:CHD Risk Coronary Heart Disease Risk Table                     Men   Women  1/2 Average Risk   3.4   3.3  Average Risk       5.0   4.4  2 X Average Risk   9.6   7.1  3 X Average Risk  23.4   11.0        Use the calculated Patient Ratio above and the CHD Risk Table to determine the patient's CHD Risk.        ATP III CLASSIFICATION (LDL):  <100     mg/dL   Optimal  100-129  mg/dL   Near or Above                    Optimal  130-159  mg/dL   Borderline  160-189  mg/dL   High  >190     mg/dL   Very High Performed at [facility], [place], [place] [address]   Glucose, capillary     Status: Abnormal   Collection Time: 12/10/17 12:16 PM  Result Value Ref Range   Glucose-Capillary 120 (H) 65 - 99 mg/dL  MRSA PCR Screening     Status: None   Collection Time: 12/10/17 12:28 PM  Result Value Ref Range   MRSA by PCR NEGATIVE NEGATIVE    Comment:        The GeneXpert MRSA Assay (FDA approved for NASAL specimens only), is one component of a comprehensive MRSA colonization surveillance program. It is not intended to diagnose MRSA infection nor to guide or monitor  treatment for MRSA infections. Performed at [facility], [address] [place], [place] [address]   CBC     Status: None   Collection Time: 12/10/17 12:59 PM  Result Value Ref Range   WBC 9.0 3.6 - 11.0 K/uL   RBC 4.83 3.80 - 5.20 MIL/uL   Hemoglobin 15.6 12.0 - 16.0 g/dL   HCT 46.5 35.0 - 47.0 %   MCV 96.2 80.0 - 100.0 fL   MCH 32.3 26.0 - 34.0 pg   MCHC 33.5 32.0 - 36.0 g/dL   RDW 13.6 11.5 - 14.5 %   Platelets 232 150 - 440 K/uL    Comment: Performed at [facility], [address] [place], [place] [address]  Creatinine, serum     Status: Abnormal   Collection Time: 12/10/17 12:59 PM  Result Value Ref Range   Creatinine, Ser 1.11 (H) 0.44 - 1.00 mg/dL   GFR calc non Af Amer 48 (L) >60 mL/min   GFR calc Af Amer 55 (L) >60 mL/min    Comment: (NOTE) The eGFR has been calculated using the CKD EPI equation. This calculation has not been validated in all  clinical situations. eGFR's persistently <60 mL/min signify possible Chronic Kidney Disease. Performed at [facility], [address] [place], [place] [address]   Troponin I     Status: Abnormal   Collection Time: 12/10/17 12:59 PM  Result Value Ref Range   Troponin I 1.48 (HH) <0.03 ng/mL    Comment: CRITICAL RESULT CALLED TO, READ BACK BY AND VERIFIED WITH [facility] RN AT 1335 12/10/17. MSS Performed at [facility], [address] [place], [place] [address]   Fibrin derivatives D-Dimer [facility] only)     Status: Abnormal   Collection Time: 12/10/17 12:59 PM  Result Value Ref Range   Fibrin derivatives D-dimer ([facility]) 599.24 (H) 0.00 - 499.00 ng/mL (FEU)    Comment: (NOTE) <> Exclusion of Venous Thromboembolism (VTE) - OUTPATIENT ONLY   (Emergency Department or [place])   0-499 ng/ml (FEU): With a low to intermediate pretest probability                      for VTE this test result excludes the diagnosis                      of VTE.   >499 ng/ml (FEU) : VTE not excluded;  additional work up for VTE is                      required. <> Testing on Inpatients and Evaluation of Disseminated Intravascular   Coagulation (DIC) Reference Range:   0-499 ng/ml (FEU) Performed at [facility], [address] [place], [place] [address]   Troponin I     Status: Abnormal   Collection Time: 12/10/17  7:12 PM  Result Value Ref Range   Troponin I 4.00 (HH) <0.03 ng/mL    Comment: CRITICAL VALUE NOTED. VALUE IS CONSISTENT WITH PREVIOUSLY REPORTED/CALLED VALUE.MSS Performed at [facility], [address] [place], [place] [address]   Troponin I     Status: Abnormal   Collection Time: 12/10/17 11:49 PM  Result Value Ref Range   Troponin I 4.15 (HH) <0.03 ng/mL    Comment: CRITICAL VALUE NOTED. VALUE IS CONSISTENT WITH PREVIOUSLY REPORTED/CALLED VALUE BY CAF Performed at [facility], [address] [place], [place] [address]   Troponin I (q 6hr x 3)     Status: Abnormal   Collection Time: 12/11/17  5:27 PM  Result Value Ref Range   Troponin I 2.38 (HH) <0.03 ng/mL    Comment: CRITICAL VALUE NOTED. VALUE IS CONSISTENT WITH PREVIOUSLY REPORTED/CALLED VALUE JJB Performed at [facility], [place], [place] [address]   Troponin I (q 6hr x 3)     Status: Abnormal   Collection Time: 12/11/17 10:59 PM  Result Value Ref Range   Troponin I 1.86 (HH) <0.03 ng/mL    Comment: CRITICAL VALUE NOTED. VALUE IS CONSISTENT WITH PREVIOUSLY REPORTED/CALLED VALUE. JAG Performed at [facility], [place],  [address]     Ct Angio Chest Pe W Or Wo Contrast  Result Date: 12/11/2017 CLINICAL DATA:  Chest pain, shortness of breath EXAM: CT ANGIOGRAPHY CHEST WITH CONTRAST TECHNIQUE: Multidetector CT imaging of the chest was performed using the standard protocol during bolus administration of intravenous contrast. Multiplanar CT image reconstructions and MIPs were obtained to evaluate the vascular  anatomy. CONTRAST:  43m ISOVUE-370 IOPAMIDOL (ISOVUE-370) INJECTION 76% COMPARISON:  None. FINDINGS: Cardiovascular: Satisfactory opacification of the pulmonary arteries to the  segmental level. No evidence of pulmonary embolism. Dilated main artery as can be seen with pulmonary arterial hypertension normal heart size. No pericardial effusion. Thoracic aortic atherosclerosis. Mediastinum/Nodes: No enlarged mediastinal, hilar, or axillary lymph nodes. Thyroid gland, trachea, and esophagus demonstrate no significant findings. Lungs/Pleura: Mild bibasilar atelectasis. No pleural effusion or pneumothorax. No consolidation. Upper Abdomen: No acute upper abdominal abnormality. 3.8 cm hypodense right hepatic mass unchanged compared with 10/23/2017 at which time the appearance was most compatible with a hemangioma. Musculoskeletal: No acute osseous abnormality. No aggressive osseous lesion. Review of the MIP images confirms the above findings. IMPRESSION: 1. No evidence of pulmonary embolus. 2. No acute cardiopulmonary disease. 3. Aortic Atherosclerosis (ICD10-I70.0). Electronically Signed   By: Kathreen Devoid   On: 12/11/2017 11:00   Dg Chest Portable 1 View  Result Date: 12/10/2017 CLINICAL DATA:  Myocardial infarct. History of ovarian carcinoma. History of breast carcinoma EXAM: PORTABLE CHEST 1 VIEW COMPARISON:  Chest radiograph July 05, 2016 PET-CT November 04, 2017 FINDINGS: There is no edema or consolidation. Heart is upper normal in size with pulmonary vascularity within normal limits. There is aortic atherosclerosis. Port-A-Cath tip is in the superior vena cava. No pneumothorax. No evident bone lesions. IMPRESSION: No edema or consolidation. Heart upper normal in size. There is aortic atherosclerosis. Aortic Atherosclerosis (ICD10-I70.0). Electronically Signed   By: Lowella Grip III M.D.   On: 12/10/2017 11:06    Review of Systems  Constitutional: Positive for diaphoresis, malaise/fatigue and weight  loss.  HENT: Positive for congestion.   Eyes: Negative.   Respiratory: Positive for shortness of breath.   Cardiovascular: Positive for chest pain.  Gastrointestinal: Positive for heartburn.  Genitourinary: Negative.   Musculoskeletal: Positive for myalgias.  Skin: Negative.   Neurological: Positive for dizziness and weakness.  Endo/Heme/Allergies: Negative.   Psychiatric/Behavioral: Negative.    Blood pressure 102/64, pulse 89, temperature 98.5 F (36.9 C), temperature source Oral, resp. rate 18, height '5\' 4"'$  (1.626 m), weight 186 lb 8 oz (84.6 kg), SpO2 97 %. Physical Exam  Nursing note and vitals reviewed. Constitutional: She is oriented to person, place, and time. She appears well-developed and well-nourished.  HENT:  Head: Normocephalic and atraumatic.  Eyes: Pupils are equal, round, and reactive to light. Conjunctivae and EOM are normal.  Neck: Normal range of motion. Neck supple.  Cardiovascular: Normal rate.  Murmur heard. Respiratory: Effort normal and breath sounds normal.  GI: Soft. Bowel sounds are normal.  Musculoskeletal: Normal range of motion.  Neurological: She is alert and oriented to person, place, and time. She has normal reflexes.  Skin: Skin is warm and dry.  Psychiatric: She has a normal mood and affect.    Assessment/Plan: STEMI inferior wall Ovarian cancer Chest pain Atrial fibrillation History of breast cancer GERD Hyperlipidemia Hypothyroidism Status post chemo . Plan Proceed with acute cardiac cath because of STEMI Continue hypertension management and control Agree with atrial fibrillation therapy with Rythmol not a good anticoagulation candidate Chest pain management recommend cardiac cath Continue hypertension control Agree with ovarian cancer therapy with radiation and chemo Continue with Synthroid therapy for hypothyroidism Elevated troponins probably related to demand ischemia Echocardiogram will be helpful for assessment of left  ventricular function CT of her chest for evaluation of chest pain symptom  Jamie Conner 12/12/2017, 2:30 AM

## 2017-12-12 NOTE — Progress Notes (Signed)
Jamie Conner   DOB:June 19, 1941   WP#:[id number]    Subjective: Patient denies having any chest pain.  Denies having any nausea vomiting.  Nausea improved after Maalox.  Continues to complain of mild epigastric discomfort.  Denies any dizziness or falls.   Objective:  Vitals:   12/12/17 0523 12/12/17 0753  BP: 115/70 122/80  Pulse: (!) 101 (!) 102  Resp:  14  Temp: 98.6 F (37 C) 97.9 F (36.6 C)  SpO2: 99% 95%     Intake/Output Summary (Last 24 hours) at 12/12/2017 2151 Last data filed at 12/12/2017 0809 Gross per 24 hour  Intake 600 ml  Output 400 ml  Net 200 ml    GENERAL: Well-nourished well-developed; Alert, no distress and comfortable.   Accompanied by  Husband.  EYES: no pallor or icterus OROPHARYNX: no thrush or ulceration. NECK: supple, no masses felt LYMPH:  no palpable lymphadenopathy in the cervical, axillary or inguinal regions LUNGS: decreased breath sounds to auscultation at bases and  No wheeze or crackles HEART/CVS: regular rate & rhythm and no murmurs; No lower extremity edema ABDOMEN: abdomen soft, non-tender and normal bowel sounds; epigastric tenderness on deep palpation. Musculoskeletal:no cyanosis of digits and no clubbing  PSYCH: alert & oriented x 3 with fluent speech NEURO: no focal motor/sensory deficits SKIN:  no rashes or significant lesions    Labs:  Lab Results  Component Value Date   WBC 9.5 12/12/2017   HGB 13.5 12/12/2017   HCT 39.5 12/12/2017   MCV 95.4 12/12/2017   PLT 196 12/12/2017   NEUTROABS 3.3 12/10/2017    Lab Results  Component Value Date   NA 134 (L) 12/12/2017   K 3.8 12/12/2017   CL 104 12/12/2017   CO2 23 12/12/2017    Studies:  Ct Angio Chest Pe W Or Wo Contrast  Result Date: 12/11/2017 CLINICAL DATA:  Chest pain, shortness of breath EXAM: CT ANGIOGRAPHY CHEST WITH CONTRAST TECHNIQUE: Multidetector CT imaging of the chest was performed using the standard protocol during bolus administration of intravenous  contrast. Multiplanar CT image reconstructions and MIPs were obtained to evaluate the vascular anatomy. CONTRAST:  53mL ISOVUE-370 IOPAMIDOL (ISOVUE-370) INJECTION 76% COMPARISON:  None. FINDINGS: Cardiovascular: Satisfactory opacification of the pulmonary arteries to the segmental level. No evidence of pulmonary embolism. Dilated main artery as can be seen with pulmonary arterial hypertension normal heart size. No pericardial effusion. Thoracic aortic atherosclerosis. Mediastinum/Nodes: No enlarged mediastinal, hilar, or axillary lymph nodes. Thyroid gland, trachea, and esophagus demonstrate no significant findings. Lungs/Pleura: Mild bibasilar atelectasis. No pleural effusion or pneumothorax. No consolidation. Upper Abdomen: No acute upper abdominal abnormality. 3.8 cm hypodense right hepatic mass unchanged compared with 10/23/2017 at which time the appearance was most compatible with a hemangioma. Musculoskeletal: No acute osseous abnormality. No aggressive osseous lesion. Review of the MIP images confirms the above findings. IMPRESSION: 1. No evidence of pulmonary embolus. 2. No acute cardiopulmonary disease. 3. Aortic Atherosclerosis (ICD10-I70.0). Electronically Signed   By: Kathreen Devoid   On: 12/11/2017 11:00    Assessment & Plan:   #77 year old female patient with a history of recurrent ovarian cancer-is currently admitted the hospital for chest pain.  # Recurrent platinum refractory ovarian cancer-most currently on Ze[place]ula [PARP inhibitor].   Discussed that cardiac adverse events are not common with Zejula-except for elevated blood pressure.  However given patient's significant chest pain/depressed[ejection fraction-see below] ; I think it is reasonable to switch PARP inhibitor to others- olapari/rupacarib.   #Nausea with vomiting-gastritis versus others. improved  s/p  PPI; add Maalox.  # Chest pain-unclear etiology; status post cardiac cath negative; decreased ejection fraction.  Question  vasospasm versus others- Taksubo cardiomyopathy-question etiology.  Patient will need a follow-up 2D echo in few months to evaluate for ejection fraction.   #Patient could be discharged from oncology standpoint-patient will be followed up at the cancer center in 2 weeks also.  Discussed with Dr.Gouru.   Cammie Sickle, MD 12/12/2017  9:51 PM

## 2017-12-13 ENCOUNTER — Telehealth: Payer: Self-pay | Admitting: *Deleted

## 2017-12-13 ENCOUNTER — Inpatient Hospital Stay: Payer: PPO

## 2017-12-13 ENCOUNTER — Inpatient Hospital Stay (HOSPITAL_BASED_OUTPATIENT_CLINIC_OR_DEPARTMENT_OTHER): Payer: PPO | Admitting: Nurse Practitioner

## 2017-12-13 ENCOUNTER — Telehealth: Payer: Self-pay

## 2017-12-13 VITALS — BP 109/64 | HR 98 | Temp 99.2°F | Resp 18 | Wt 193.6 lb

## 2017-12-13 DIAGNOSIS — C569 Malignant neoplasm of unspecified ovary: Secondary | ICD-10-CM

## 2017-12-13 DIAGNOSIS — Z7982 Long term (current) use of aspirin: Secondary | ICD-10-CM | POA: Diagnosis not present

## 2017-12-13 DIAGNOSIS — E785 Hyperlipidemia, unspecified: Secondary | ICD-10-CM | POA: Diagnosis not present

## 2017-12-13 DIAGNOSIS — Z90722 Acquired absence of ovaries, bilateral: Secondary | ICD-10-CM

## 2017-12-13 DIAGNOSIS — I700 Atherosclerosis of aorta: Secondary | ICD-10-CM | POA: Diagnosis not present

## 2017-12-13 DIAGNOSIS — J329 Chronic sinusitis, unspecified: Secondary | ICD-10-CM | POA: Diagnosis not present

## 2017-12-13 DIAGNOSIS — J069 Acute upper respiratory infection, unspecified: Secondary | ICD-10-CM | POA: Diagnosis not present

## 2017-12-13 DIAGNOSIS — I480 Paroxysmal atrial fibrillation: Secondary | ICD-10-CM | POA: Diagnosis not present

## 2017-12-13 DIAGNOSIS — K2970 Gastritis, unspecified, without bleeding: Secondary | ICD-10-CM | POA: Diagnosis not present

## 2017-12-13 DIAGNOSIS — Z923 Personal history of irradiation: Secondary | ICD-10-CM | POA: Diagnosis not present

## 2017-12-13 DIAGNOSIS — E039 Hypothyroidism, unspecified: Secondary | ICD-10-CM | POA: Diagnosis not present

## 2017-12-13 DIAGNOSIS — Z95828 Presence of other vascular implants and grafts: Secondary | ICD-10-CM

## 2017-12-13 DIAGNOSIS — Z9223 Personal history of estrogen therapy: Secondary | ICD-10-CM

## 2017-12-13 DIAGNOSIS — R079 Chest pain, unspecified: Secondary | ICD-10-CM | POA: Diagnosis not present

## 2017-12-13 DIAGNOSIS — Z9221 Personal history of antineoplastic chemotherapy: Secondary | ICD-10-CM

## 2017-12-13 DIAGNOSIS — I4891 Unspecified atrial fibrillation: Secondary | ICD-10-CM

## 2017-12-13 DIAGNOSIS — Z452 Encounter for adjustment and management of vascular access device: Secondary | ICD-10-CM | POA: Diagnosis not present

## 2017-12-13 DIAGNOSIS — I10 Essential (primary) hypertension: Secondary | ICD-10-CM | POA: Diagnosis not present

## 2017-12-13 DIAGNOSIS — I429 Cardiomyopathy, unspecified: Secondary | ICD-10-CM

## 2017-12-13 DIAGNOSIS — C786 Secondary malignant neoplasm of retroperitoneum and peritoneum: Secondary | ICD-10-CM | POA: Diagnosis not present

## 2017-12-13 DIAGNOSIS — G8918 Other acute postprocedural pain: Secondary | ICD-10-CM | POA: Diagnosis not present

## 2017-12-13 DIAGNOSIS — Z803 Family history of malignant neoplasm of breast: Secondary | ICD-10-CM

## 2017-12-13 DIAGNOSIS — R600 Localized edema: Secondary | ICD-10-CM

## 2017-12-13 DIAGNOSIS — K219 Gastro-esophageal reflux disease without esophagitis: Secondary | ICD-10-CM | POA: Diagnosis not present

## 2017-12-13 DIAGNOSIS — Z853 Personal history of malignant neoplasm of breast: Secondary | ICD-10-CM

## 2017-12-13 DIAGNOSIS — R531 Weakness: Secondary | ICD-10-CM

## 2017-12-13 DIAGNOSIS — Z800 Family history of malignant neoplasm of digestive organs: Secondary | ICD-10-CM

## 2017-12-13 DIAGNOSIS — J209 Acute bronchitis, unspecified: Secondary | ICD-10-CM | POA: Diagnosis not present

## 2017-12-13 DIAGNOSIS — Z79899 Other long term (current) drug therapy: Secondary | ICD-10-CM

## 2017-12-13 LAB — CBC WITH DIFFERENTIAL/PLATELET
BASOS ABS: 0.1 10*3/uL (ref 0–0.1)
BASOS PCT: 1 %
EOS PCT: 0 %
Eosinophils Absolute: 0 10*3/uL (ref 0–0.7)
HEMATOCRIT: 38.3 % (ref 35.0–47.0)
Hemoglobin: 13.3 g/dL (ref 12.0–16.0)
LYMPHS PCT: 25 %
Lymphs Abs: 2.7 10*3/uL (ref 1.0–3.6)
MCH: 32.9 pg (ref 26.0–34.0)
MCHC: 34.7 g/dL (ref 32.0–36.0)
MCV: 95 fL (ref 80.0–100.0)
MONO ABS: 1 10*3/uL — AB (ref 0.2–0.9)
Monocytes Relative: 9 %
NEUTROS ABS: 6.9 10*3/uL — AB (ref 1.4–6.5)
Neutrophils Relative %: 65 %
PLATELETS: 183 10*3/uL (ref 150–440)
RBC: 4.03 MIL/uL (ref 3.80–5.20)
RDW: 13.5 % (ref 11.5–14.5)
WBC: 10.8 10*3/uL (ref 3.6–11.0)

## 2017-12-13 LAB — COMPREHENSIVE METABOLIC PANEL
ALBUMIN: 3.2 g/dL — AB (ref 3.5–5.0)
ALT: 23 U/L (ref 14–54)
AST: 36 U/L (ref 15–41)
Alkaline Phosphatase: 65 U/L (ref 38–126)
Anion gap: 8 (ref 5–15)
BUN: 15 mg/dL (ref 6–20)
CHLORIDE: 102 mmol/L (ref 101–111)
CO2: 22 mmol/L (ref 22–32)
CREATININE: 1 mg/dL (ref 0.44–1.00)
Calcium: 8.9 mg/dL (ref 8.9–10.3)
GFR calc Af Amer: >60 mL/min (ref >60)
GFR, EST NON AFRICAN AMERICAN: 54 mL/min — AB (ref >60)
GLUCOSE: 143 mg/dL — AB (ref 65–99)
POTASSIUM: 4.3 mmol/L (ref 3.5–5.1)
Sodium: 132 mmol/L — ABNORMAL LOW (ref 135–145)
Total Bilirubin: 0.5 mg/dL (ref 0.3–1.2)
Total Protein: 6.3 g/dL — ABNORMAL LOW (ref 6.5–8.1)

## 2017-12-13 MED ORDER — HEPARIN SOD (PORK) LOCK FLUSH 100 UNIT/ML IV SOLN
500.0000 [IU] | Freq: Once | INTRAVENOUS | Status: AC
  Administered 2017-12-13: 500 [IU]
  Filled 2017-12-13: qty 5

## 2017-12-13 MED ORDER — FUROSEMIDE 20 MG PO TABS: 10.0000 mg | ORAL_TABLET | Freq: Every day | ORAL | 0 refills | Status: DC

## 2017-12-13 MED ORDER — AZITHROMYCIN 250 MG PO TABS: ORAL_TABLET | ORAL | 0 refills | Status: DC

## 2017-12-13 NOTE — Patient Instructions (Addendum)
Azithromycin tablets What is this medicine? AZITHROMYCIN (az ith roe MYE sin) is a macrolide antibiotic. It is used to treat or prevent certain kinds of bacterial infections. It will not work for colds, flu, or other viral infections. This medicine may be used for other purposes; ask your health care provider or pharmacist if you have questions. COMMON BRAND NAME(S): Zithromax, Zithromax Tri-Pak, Zithromax Z-Pak What should I tell my health care provider before I take this medicine? They need to know if you have any of these conditions: -kidney disease -liver disease -irregular heartbeat or heart disease -an unusual or allergic reaction to azithromycin, erythromycin, other macrolide antibiotics, foods, dyes, or preservatives -pregnant or trying to get pregnant -breast-feeding How should I use this medicine? Take this medicine by mouth with a full glass of water. Follow the directions on the prescription label. The tablets can be taken with food or on an empty stomach. If the medicine upsets your stomach, take it with food. Take your medicine at regular intervals. Do not take your medicine more often than directed. Take all of your medicine as directed even if you think your are better. Do not skip doses or stop your medicine early. Talk to your pediatrician regarding the use of this medicine in children. While this drug may be prescribed for children as young as 6 months for selected conditions, precautions do apply. Overdosage: If you think you have taken too much of this medicine contact a poison control center or emergency room at once. NOTE: This medicine is only for you. Do not share this medicine with others. What if I miss a dose? If you miss a dose, take it as soon as you can. If it is almost time for your next dose, take only that dose. Do not take double or extra doses. What may interact with this medicine? Do not take this medicine with any of the following  medications: -lincomycin This medicine may also interact with the following medications: -amiodarone -antacids -birth control pills -cyclosporine -digoxin -magnesium -nelfinavir -phenytoin -warfarin This list may not describe all possible interactions. Give your health care provider a list of all the medicines, herbs, non-prescription drugs, or dietary supplements you use. Also tell them if you smoke, drink alcohol, or use illegal drugs. Some items may interact with your medicine. What should I watch for while using this medicine? Tell your doctor or healthcare professional if your symptoms do not start to get better or if they get worse. Do not treat diarrhea with over the counter products. Contact your doctor if you have diarrhea that lasts more than 2 days or if it is severe and watery. This medicine can make you more sensitive to the sun. Keep out of the sun. If you cannot avoid being in the sun, wear protective clothing and use sunscreen. Do not use sun lamps or tanning beds/booths. What side effects may I notice from receiving this medicine? Side effects that you should report to your doctor or health care professional as soon as possible: -allergic reactions like skin rash, itching or hives, swelling of the face, lips, or tongue -confusion, nightmares or hallucinations -dark urine -difficulty breathing -hearing loss -irregular heartbeat or chest pain -pain or difficulty passing urine -redness, blistering, peeling or loosening of the skin, including inside the mouth -white patches or sores in the mouth -yellowing of the eyes or skin Side effects that usually do not require medical attention (report to your doctor or health care professional if they continue or are bothersome): -  diarrhea -dizziness, drowsiness -headache -stomach upset or vomiting -tooth discoloration -vaginal irritation This list may not describe all possible side effects. Call your doctor for medical advice  about side effects. You may report side effects to FDA at 1-800-FDA-1088. Where should I keep my medicine? Keep out of the reach of children. Store at room temperature between 15 and 30 degrees C (59 and 86 degrees F). Throw away any unused medicine after the expiration date. NOTE: This sheet is a summary. It may not cover all possible information. If you have questions about this medicine, talk to your doctor, pharmacist, or health care provider.  2018 Elsevier/Gold Standard (2015-10-04 15:26:03) Furosemide tablets What is this medicine? FUROSEMIDE (fyoor [place] se mide) is a diuretic. It helps you make more urine and to lose salt and excess water from your body. This medicine is used to treat high blood pressure, and edema or swelling from heart, kidney, or liver disease. This medicine may be used for other purposes; ask your health care provider or pharmacist if you have questions. COMMON BRAND NAME(S): Active-Medicated Specimen Kit, Delone, Diuscreen, Lasix, RX Specimen Collection Kit, Specimen Collection Kit, URINX Medicated Specimen Collection What should I tell my health care provider before I take this medicine? They need to know if you have any of these conditions: -abnormal blood electrolytes -diarrhea or vomiting -gout -heart disease -kidney disease, small amounts of urine, or difficulty passing urine -liver disease -thyroid disease -an unusual or allergic reaction to furosemide, sulfa drugs, other medicines, foods, dyes, or preservatives -pregnant or trying to get pregnant -breast-feeding How should I use this medicine? Take this medicine by mouth with a glass of water. Follow the directions on the prescription label. You may take this medicine with or without food. If it upsets your stomach, take it with food or milk. Do not take your medicine more often than directed. Remember that you will need to pass more urine after taking this medicine. Do not take your medicine at a time of  day that will cause you problems. Do not take at bedtime. Talk to your pediatrician regarding the use of this medicine in children. While this drug may be prescribed for selected conditions, precautions do apply. Overdosage: If you think you have taken too much of this medicine contact a poison control center or emergency room at once. NOTE: This medicine is only for you. Do not share this medicine with others. What if I miss a dose? If you miss a dose, take it as soon as you can. If it is almost time for your next dose, take only that dose. Do not take double or extra doses. What may interact with this medicine? -aspirin and aspirin-like medicines -certain antibiotics -chloral hydrate -cisplatin -cyclosporine -digoxin -diuretics -laxatives -lithium -medicines for blood pressure -medicines that relax muscles for surgery -methotrexate -NSAIDs, medicines for pain and inflammation like ibuprofen, naproxen, or indomethacin -phenytoin -steroid medicines like prednisone or cortisone -sucralfate -thyroid hormones This list may not describe all possible interactions. Give your health care provider a list of all the medicines, herbs, non-prescription drugs, or dietary supplements you use. Also tell them if you smoke, drink alcohol, or use illegal drugs. Some items may interact with your medicine. What should I watch for while using this medicine? Visit your doctor or health care professional for regular checks on your progress. Check your blood pressure regularly. Ask your doctor or health care professional what your blood pressure should be, and when you should contact him or her. If  you are a diabetic, check your blood sugar as directed. You may need to be on a special diet while taking this medicine. Check with your doctor. Also, ask how many glasses of fluid you need to drink a day. You must not get dehydrated. You may get drowsy or dizzy. Do not drive, use machinery, or do anything that needs  mental alertness until you know how this drug affects you. Do not stand or sit up quickly, especially if you are an older patient. This reduces the risk of dizzy or fainting spells. Alcohol can make you more drowsy and dizzy. Avoid alcoholic drinks. This medicine can make you more sensitive to the sun. Keep out of the sun. If you cannot avoid being in the sun, wear protective clothing and use sunscreen. Do not use sun lamps or tanning beds/booths. What side effects may I notice from receiving this medicine? Side effects that you should report to your doctor or health care professional as soon as possible: -blood in urine or stools -dry mouth -fever or chills -hearing loss or ringing in the ears -irregular heartbeat -muscle pain or weakness, cramps -skin rash -stomach upset, pain, or nausea -tingling or numbness in the hands or feet -unusually weak or tired -vomiting or diarrhea -yellowing of the eyes or skin Side effects that usually do not require medical attention (report to your doctor or health care professional if they continue or are bothersome): -headache -loss of appetite -unusual bleeding or bruising This list may not describe all possible side effects. Call your doctor for medical advice about side effects. You may report side effects to FDA at 1-800-FDA-1088. Where should I keep my medicine? Keep out of the reach of children. Store at room temperature between 15 and 30 degrees C (59 and 86 degrees F). Protect from light. Throw away any unused medicine after the expiration date. NOTE: This sheet is a summary. It may not cover all possible information. If you have questions about this medicine, talk to your doctor, pharmacist, or health care provider.  2018 Elsevier/Gold Standard (2014-10-27 13:49:50) Incentive Spirometer An incentive spirometer is a tool that measures how well you are filling your lungs with each breath. This tool can help keep your lungs clear and active.  Taking long, deep breaths may help reverse or decrease the chance of developing breathing (pulmonary) problems, especially infection, following:  Surgery of the chest or abdomen.  Surgery if you have a history of smoking or a lung problem.  A long period of time when you are unable to move or be active.  If the spirometer includes an indicator to show your best effort, your health care provider or respiratory therapist will help you set a goal. Keep a log of your progress if directed by your health care provider. What are the risks?  Breathing too quickly may cause dizziness or cause you to pass out. Take your time so you do not get dizzy or lightheaded.  If you are in pain, you may need to take or ask for pain medicine before doing incentive spirometry. It is harder to take a deep breath if you are having pain. How to use your incentive spirometer 1. Sit on the edge of your bed if possible, or sit up as far as you can in bed or on a chair. 2. Hold the incentive spirometer in an upright position. 3. Breathe out normally. 4. Place the mouthpiece in your mouth and seal your lips tightly around it. 5. Breathe in slowly  and as deeply as possible, raising the piston or the ball toward the top of the column. 6. Hold your breath for 3-5 seconds or for as long as possible. Allow the piston or ball to fall to the bottom of the column. 7. Remove the mouthpiece from your mouth and breathe out normally. 8. The spirometer may include an indicator to show your best effort. Use the indicator as a goal to work toward during each repetition. 9. Rest for a few seconds and repeat this at least 10 times, every 1-2 hours when you are awake. Take your time and take a few normal breaths between deep breaths. Breathing too quickly may cause dizziness or cause you to pass out. Take your time so you do not get dizzy or lightheaded. 10. After each set of 10 deep breaths, practice coughing to be sure your lungs are  clear. If you had a surgical cut (incision) made during surgery, support your incision when coughing by placing a pillow or rolled-up towel firmly against it. Once you are able to get out of bed, walk around indoors and cough well. You may stop using the incentive spirometer when instructed by your health care provider. Contact a health care provider if:  You are having difficulty using the spirometer.  You have trouble using the spirometer as often as instructed.  Your pain medicine is not giving enough relief while using the spirometer.  You have a fever.  You develop shortness of breath. Get help right away if:  You develop a cough with bloody sputum.  You develop worsening pain, redness, or discharge at or near the incision site. This information is not intended to replace advice given to you by your health care provider. Make sure you discuss any questions you have with your health care provider. Document Released: 12/17/2006 Document Revised: 04/30/2016 Document Reviewed: 03/15/2014 Elsevier Interactive Patient Education  [employer].

## 2017-12-13 NOTE — Telephone Encounter (Signed)
Patient called, she was discharged form hospital yesterday s/p STEMI and states she is sick and needs to be seen. She complains of cough, shortness of breath, and nausea. Appointment given for this morning at 10 AM and accepted.

## 2017-12-13 NOTE — Progress Notes (Signed)
[facility] [place]  Telephone:(336) [phone] Fax:(336) [phone]  Patient Care Team: Jerrol Banana., MD as PCP - General (Family Medicine) Gillis Ends, MD as Referring Physician (Obstetrics and Gynecology) Bary Castilla Forest Gleason, MD as Consulting Physician (General Surgery)   Name of the patient: Jamie Conner  [id number]  [birth year]   Date of visit: 12/13/17  Diagnosis- platinum-resistant, recurrent stage IIIc serous ovarian cancer  Chief complaint/ Reason for visit- Cough, SOB, Nausea  Heme/Onc history: Patient last evaluated by primary oncologist, Dr. Rogue Bussing, on 12/12/2017 while she was inpatient at [facility]. She has a history of platinum resistant recurrent stage IIIc serous ovarian cancer.  She has a history of carcinoma of the left breast status post lumpectomy, radiation therapy, and 5 years of tamoxifen.  She underwent ex lap, BSO, right ureterolysis, infragastric omentectomy, and was optimally debulked to no gross residual disease on 12/22/2014.  Preop Ca-125= 2354.0.  She underwent chemotherapy with carbotaxol and dose dense fashion from 01/02/2015 completing 6 cycles on 05/09/2015.  CT on 01/10/2016 showed recurrence and she was started on PLD and carboplatin and completed 6 cycles. MRI of pelvis on 07/09/2017 - NED w/i pelvis, stable 1.5 cm right vaginal wall cyst consistent with a benign Gardner's duct or Bartholin's gland cyst.  MRI of lumbar spine- neg for mets, mild DDD. 07/2017 imaging showed progressive disease.  09/24/2016 she was started on paclitaxel and bevacizumab and received 6 cycles with evidence of response and began maintenance bevacizumab 03/2017. PET on 11/04/2017 showed multiple small hypermetabolic foci of tumor observed along the bowel and pelvic mesentery as well as the vaginal cuff compatible with early spread of ovarian cancer.    Interval history- [facility] complaints  of cough, sob, and nausea. She reports that symptoms began while she was inpatient and cough has gradually worsened. She reports the nausea is somewhat ongoing and improves with Maalox. Additionally, she reports continued chest pain since her catheterization which is gradually improving. We reviewed the medication changes that we made during her hospitalization and she expresses that she has not started taking aspirin or the statin. She says she does not understand the purpose of these medications and is reluctant to take additional medications due to side effects. She is also concerned that her feet are swollen and reports that her shoes feel tight.   Patient was discharged from hospital yesterday, 12/12/2017. She was admitted to [facility] on 12/10/17 for chest pain.  She had a heart catheterization with Dr. Clayborn Bigness which did not reveal any significant blockage of coronary arteries. No significant epicardical coronary disease interior stemi pattern but left ventriculogram suggested large anterior area apically may be consistent with Takutsubo cardiomyopathy. ECHO-LV ef 30-35% (moderate to severely depressed LV function; June 2017 MUGA 62%). Troponins were elevated.  CTA chest negative for PE. Patient discharged on aspirin and statin.  Was instructed to continue lisinopril 10 mg at home.  Beta-blocker was not added due to soft blood pressures.  She has a history of persistent A. fib and continues Rythmol. She had started Niraparib 9 days prior to admission for platinum-resistant, recurrent stage IIIc ovarian cancer. Dr. Rogue Bussing concerned this could have contributed to spasm. Zejula held and per Dr. Rogue Bussing, he intends to switch to another parp inhibitor (possibly olaparib). During admission, she also complained of cough and gastritis. She was given a PPI and Maalox which improved her symptoms.   ECOG FS:1 - Symptomatic but completely ambulatory  Review  of systems- Review of Systems  Constitutional: Positive  for chills, fever and malaise/fatigue.  HENT: Positive for congestion, sinus pain and sore throat.   Eyes: Negative.   Respiratory: Positive for cough and shortness of breath ('better'). Negative for sputum production.   Cardiovascular: Positive for chest pain ('much better') and leg swelling ('my shoes are feeling too tight'). Negative for claudication.  Gastrointestinal: Positive for abdominal pain (upper abdomen), heartburn and nausea. Negative for diarrhea.  Genitourinary: Negative.   Musculoskeletal: Negative.   Skin: Negative.   Neurological: Positive for weakness ('all over; I just feel worn out'). Negative for dizziness, tingling, tremors, sensory change, speech change, focal weakness, seizures, loss of consciousness and headaches.  Psychiatric/Behavioral: The patient is nervous/anxious.      Current treatment- Zejula (currently held since recent admission)  Allergies  Allergen Reactions  . Carafate [Sucralfate] Rash  . Sulfa Antibiotics Rash    Past Medical History:  Diagnosis Date  . Atrial fibrillation ([place])   . Breast cancer ([place]) 1998  . Cancer of breast ([place]) 1998   Left/radiation  . CINV (chemotherapy-induced nausea and vomiting)   . GERD (gastroesophageal reflux disease)   . Hyperlipidemia   . Hypothyroidism   . Mitral insufficiency   . Ovarian cancer ([place])    s/p BSO optimal tumor debulking May 2016/chemo  . Personal history of chemotherapy since 2016   ovarian cancer  . Personal history of radiation therapy 1998  . PVC (premature ventricular contraction)     Past Surgical History:  Procedure Laterality Date  . ABDOMINAL HYSTERECTOMY    . BILATERAL SALPINGOOPHORECTOMY  May 2016   with optimal tumor debulking   . BREAST LUMPECTOMY Left 1998  . BREAST SURGERY     Left  . CESAREAN SECTION     x2  . CHOLECYSTECTOMY    . COLONOSCOPY  2006  . DEBULKING N/A 12/22/2014   Procedure: DEBULKING;  Surgeon: Gillis Ends, MD;  Location: [facility] ORS;   Service: Gynecology;  Laterality: N/A;  . LAPAROTOMY N/A 12/22/2014   Procedure: EXPLORATORY LAPAROTOMY;  Surgeon: Robert Bellow, MD;  Location: [facility] ORS;  Service: General;  Laterality: N/A;  . LAPAROTOMY WITH STAGING N/A 12/22/2014   Procedure: LAPAROTOMY WITH STAGING;  Surgeon: Gillis Ends, MD;  Location: [facility] OR[facility];  Service: Gynecology;  Laterality: N/A;  . LEFT HEART CATH AND CORONARY ANGIOGRAPHY N/A 12/10/2017   Procedure: LEFT HEART CATH AND CORONARY ANGIOGRAPHY;  Surgeon: Yolonda Kida, MD;  Location: [place] [facility];  Service: Cardiovascular;  Laterality: N/A;  . PERIPHERAL VASCULAR CATHETERIZATION Left 12/22/2014   Procedure: PORTA CATH INSERTION;  Surgeon: Robert Bellow, MD;  Location: [facility] OR[facility];  Service: General;  Laterality: Left;  . PORTACATH PLACEMENT Right 12/22/2014   Procedure: INSERTION PORT-A-CATH;  Surgeon: Robert Bellow, MD;  Location: [facility] OR[facility];  Service: General;  Laterality: Right;  . SALPINGOOPHORECTOMY Bilateral 12/22/2014   Procedure: SALPINGO OOPHORECTOMY;  Surgeon: Gillis Ends, MD;  Location: [facility] OR[facility];  Service: Gynecology;  Laterality: Bilateral;  . UPPER GI ENDOSCOPY  09/25/04   hiatus hernia, schatzki ring and a single gastric polyp    Social History   Socioeconomic History  . Marital status: Married    Spouse name: Not on file  . Number of children: Not on file  . Years of education: Not on file  . Highest education level: Not on file  Occupational History  . Not on file  Social Needs  . Financial resource strain: Not on  file  . Food insecurity:    Worry: Not on file    Inability: Not on file  . Transportation needs:    Medical: Not on file    Non-medical: Not on file  Tobacco Use  . Smoking status: Never Smoker  . Smokeless tobacco: Never Used  Substance and Sexual Activity  . Alcohol use: No  . Drug use: No  . Sexual activity: Not Currently  Lifestyle  . Physical activity:    Days per week: Not on file     Minutes per session: Not on file  . Stress: Not on file  Relationships  . Social connections:    Talks on phone: Not on file    Gets together: Not on file    Attends religious service: Not on file    Active member of club or organization: Not on file    Attends meetings of clubs or organizations: Not on file    Relationship status: Not on file  . Intimate partner violence:    Fear of current or ex partner: Not on file    Emotionally abused: Not on file    Physically abused: Not on file    Forced sexual activity: Not on file  Other Topics Concern  . Not on file  Social History Narrative  . Not on file    Family History  Problem Relation Age of Onset  . Cancer Mother        breast, throat, and stomach  . Breast cancer Mother   . Heart disease Father   . Pulmonary embolism Sister   . Cancer Brother 60       angiosarcoma of the chest; carcinoid small intestinal tumor  . Hypertension Brother   . Cancer Maternal Aunt        breast cancer  . Cancer Maternal Grandfather 4       pancreatic    Current Outpatient Medications:  .  alum & mag hydroxide-simeth (MAALOX/MYLANTA) 200-200-20 MG/5ML suspension, Take 15 mLs by mouth every 6 (six) hours as needed for indigestion or heartburn., Disp: 355 mL, Rfl: 0 .  aspirin EC 325 MG tablet, Take 1 tablet (325 mg total) by mouth daily., Disp: 100 tablet, Rfl: 3 .  atorvastatin (LIPITOR) 40 MG tablet, Take 1 tablet (40 mg total) by mouth daily at 6 PM., Disp: 30 tablet, Rfl: 0 .  Cholecalciferol (VITAMIN D) 2000 units tablet, Take 1 tablet (2,000 Units total) by mouth daily., Disp: 30 tablet, Rfl: 12 .  Docosanol (ABREVA EX), Apply 1 application topically as needed. Fever blisters, Disp: , Rfl:  .  levothyroxine (SYNTHROID, LEVOTHROID) 50 MCG tablet, TAKE 1 TABLET EVERY DAY ON AN EMPTY STOMACH WITH A GLASS OF WATER AT LEAST 30-60 MINUTES BEFORE BREAKFAST, Disp: 30 tablet, Rfl: 12 .  lidocaine-prilocaine (EMLA) cream, Apply to port area 30-45  mins prior to chemo., Disp: 30 g, Rfl: 5 .  lisinopril (PRINIVIL,ZESTRIL) 10 MG tablet, Take 1 tablet (10 mg total) by mouth daily., Disp: 90 tablet, Rfl: 4 .  LORazepam (ATIVAN) 0.5 MG tablet, Take 0.5 tablets (0.25 mg total) by mouth at bedtime., Disp: 30 tablet, Rfl: 4 .  Lysine 500 MG TABS, Take 1 tablet by mouth daily as needed. , Disp: , Rfl:  .  magic mouthwash w/lidocaine SOLN, Take 5 mLs by mouth every 4 (four) hours as needed., Disp: , Rfl: 1 .  Multiple Vitamin (MULTIVITAMIN) capsule, Take 1 capsule by mouth daily., Disp: , Rfl:  .  ondansetron (ZOFRAN)  4 MG tablet, One pill 30-60 mins prior to taking zejula to prevent nausea/vomitting., Disp: 45 tablet, Rfl: 3 .  pantoprazole (PROTONIX) 40 MG tablet, Take 1 tablet (40 mg total) by mouth daily., Disp: 30 tablet, Rfl: 1 .  propafenone (RYTHMOL) 225 MG tablet, Take 225 mg by mouth 2 (two) times daily., Disp: , Rfl:  .  traMADol (ULTRAM) 50 MG tablet, Take 1 tablet (50 mg total) by mouth every 6 (six) hours as needed. (Patient not taking: Reported on 11/09/2017), Disp: 30 tablet, Rfl: 0 No current facility-administered medications for this visit.    Physical exam:  Vitals:   12/13/17 1028  BP: 109/64  Pulse: 98  Resp: 18  Temp: 99.2 F (37.3 C)  TempSrc: Tympanic  SpO2: 94%  Weight: 193 lb 9.6 oz (87.8 kg)   Physical Exam  Constitutional: She is oriented to person, place, and time. She appears well-developed and well-nourished. No distress.  HENT:  Head: Normocephalic and atraumatic.  Nose: Rhinorrhea present.  Mouth/Throat: Posterior oropharyngeal edema present.  Eyes: Conjunctivae are normal. No scleral icterus.  Neck: Normal range of motion. Neck supple.  Cardiovascular: Intact distal pulses and normal pulses. An irregularly irregular rhythm present.  Murmur heard. Pulmonary/Chest: Effort normal. She has no decreased breath sounds. She has no wheezes. She has no rhonchi. She has no rales.  Abdominal: Soft. Bowel sounds  are normal. She exhibits no distension. There is no tenderness.  Musculoskeletal:       Right lower leg: She exhibits edema.       Left lower leg: She exhibits edema.  Neurological: She is alert and oriented to person, place, and time.  Skin: Skin is warm and dry.  Psychiatric: Her mood appears anxious.     CMP Latest Ref Rng & Units 12/12/2017  Glucose 65 - 99 mg/dL 103(H)  BUN 6 - 20 mg/dL 15  Creatinine 0.44 - 1.00 mg/dL 1.14(H)  Sodium 135 - 145 mmol/L 134(L)  Potassium 3.5 - 5.1 mmol/L 3.8  Chloride 101 - 111 mmol/L 104  CO2 22 - 32 mmol/L 23  Calcium 8.9 - 10.3 mg/dL 8.5(L)  Total Protein 6.5 - 8.1 g/dL -  Total Bilirubin 0.3 - 1.2 mg/dL -  Alkaline Phos 38 - 126 U/L -  AST 15 - 41 U/L -  ALT 14 - 54 U/L -   CBC Latest Ref Rng & Units 12/12/2017  WBC 3.6 - 11.0 K/uL 9.5  Hemoglobin 12.0 - 16.0 g/dL 13.5  Hematocrit 35.0 - 47.0 % 39.5  Platelets 150 - 440 K/uL 196   No images are attached to the encounter.  Ct Angio Chest Pe W Or Wo Contrast  Result Date: 12/11/2017 CLINICAL DATA:  Chest pain, shortness of breath EXAM: CT ANGIOGRAPHY CHEST WITH CONTRAST TECHNIQUE: Multidetector CT imaging of the chest was performed using the standard protocol during bolus administration of intravenous contrast. Multiplanar CT image reconstructions and MIPs were obtained to evaluate the vascular anatomy. CONTRAST:  20mL ISOVUE-370 IOPAMIDOL (ISOVUE-370) INJECTION 76% COMPARISON:  None. FINDINGS: Cardiovascular: Satisfactory opacification of the pulmonary arteries to the segmental level. No evidence of pulmonary embolism. Dilated main artery as can be seen with pulmonary arterial hypertension normal heart size. No pericardial effusion. Thoracic aortic atherosclerosis. Mediastinum/Nodes: No enlarged mediastinal, hilar, or axillary lymph nodes. Thyroid gland, trachea, and esophagus demonstrate no significant findings. Lungs/Pleura: Mild bibasilar atelectasis. No pleural effusion or pneumothorax. No  consolidation. Upper Abdomen: No acute upper abdominal abnormality. 3.8 cm hypodense right hepatic mass unchanged compared  with 10/23/2017 at which time the appearance was most compatible with a hemangioma. Musculoskeletal: No acute osseous abnormality. No aggressive osseous lesion. Review of the MIP images confirms the above findings. IMPRESSION: 1. No evidence of pulmonary embolus. 2. No acute cardiopulmonary disease. 3. Aortic Atherosclerosis (ICD10-I70.0). Electronically Signed   By: Kathreen Devoid   On: 12/11/2017 11:00   Dg Chest Portable 1 View  Result Date: 12/10/2017 CLINICAL DATA:  Myocardial infarct. History of ovarian carcinoma. History of breast carcinoma EXAM: PORTABLE CHEST 1 VIEW COMPARISON:  Chest radiograph July 05, 2016 PET-CT November 04, 2017 FINDINGS: There is no edema or consolidation. Heart is upper normal in size with pulmonary vascularity within normal limits. There is aortic atherosclerosis. Port-A-Cath tip is in the superior vena cava. No pneumothorax. No evident bone lesions. IMPRESSION: No edema or consolidation. Heart upper normal in size. There is aortic atherosclerosis. Aortic Atherosclerosis (ICD10-I70.0). Electronically Signed   By: Lowella Grip III M.D.   On: 12/10/2017 11:06    Assessment and plan- Patient is a 77 y.o. female with history of recurrent ovarian cancer who [facility] for complaints of URI-like symptoms since discharge from hospital.    1. Recurrent Ovarian Cancer- recurrent platinum resistant stage IIIc ovarian cancer- most currently on zejula (parp inhibitor)- currently held d/t recent chest pain & hospital admission (see below). Per Dr. Rogue Bussing, continue to hold Zejula and he will follow-up with patient on 12/27/2017 for consideration of switching to different parp inhibitor (lynparza?).   2. Cardiomyopathy- 1 day post discharge from hospital for chest pain and heart catheterization. Cardiac cath showed no evidence of  significant coronary artery stenosis but EF reduced to 30-35% and inferior wall stunned. CTA negative for PE. Per Cardiologist's note, may be stressed induced or Taksubo cardiomyopathy. Bandage at cath site removed. Start Aspirin and Statin per inpatient instructions. Discussed rationale and MOA of these medications in depth. Patient has resumed Lisinopril 10mg  per inpatient instructions. Again, discussed rationale and MOA of this medication and rationale for holding off on addition of Beta Blocker at this time.  Start Lasix 10mg  tablet qd. Patient to check bp at home TID and keep BP journal. Discussed holding lasix if BP less than 105/80 or symptomatic. Per note, echo in July 2019 for re-assess EF. Follow-up with Cardiology, Dr. Nehemiah Massed on 12/24/17 as scheduled.   3. A fib- Continue Rythmol. F/u with Dr. Nehemiah Massed as directed.   4. Gastritis- similar to inpatient symptoms. Tolerating PO intake. Improved with PPI. Discussed Tums or Maalox for breakthrough symptoms.   5. URI- symptoms consistent with URI. Mildly elevated temperature. Known sick contacts and recent hospitalization. Start Azithromycin for URI.    Visit Diagnosis 1. AF (paroxysmal atrial fibrillation) ([place])   2. Malignant neoplasm of ovary, unspecified laterality ([place])   3. Cardiomyopathy, unspecified type ([place])   4. Gastroesophageal reflux disease, esophagitis presence not specified   5. Upper respiratory tract infection, unspecified type     Patient expressed understanding and was in agreement with this plan. She also understands that She can call clinic at any time with any questions, concerns, or complaints. ER precautions provided for worsening symptoms.   A total of (35) minutes of face-to-face time was spent with this patient with greater than 50% of that time in counseling and care-coordination.   Beckey Rutter, DNP, AGNP-C [place] at [facility] [phone] (work cell) [phone] (office) 12/20/17 10:59  AM

## 2017-12-13 NOTE — Progress Notes (Signed)
Patient here as acute add on for nausea, chest pain, cough and shortness of breath. She was admitted to [facility] for STEMI on Tuesday through the ED, and released from yesterday at 1:30. She underwent a heart catheretization on Tuesday but they didn't find any blockage. She thinks she was released too soon from the hospital.

## 2017-12-13 NOTE — Telephone Encounter (Signed)
Called pt to complete a TCM call. Pt states she is currently at another apt and will f/u with [place] at her apt next week (12/18/17 with Tawanna Sat). Unable to complete TCM. FYI to PCP and Tawanna Sat. -MM

## 2017-12-14 LAB — CA 125: Cancer Antigen (CA) 125: 33.4 U/mL (ref 0.0–38.1)

## 2017-12-16 NOTE — Telephone Encounter (Signed)
Left note on 12/18/17 hosp f/u apt notes to have them schedule her awv and cpe next apt. -MM

## 2017-12-17 NOTE — Progress Notes (Signed)
Patient: Jamie Conner Female    DOB: 03-Dec-1940   77 y.o.   MRN: [id number] Visit Date: 12/18/2017  Today's Provider: Mar Daring, PA-C   Chief Complaint  Patient presents with  . Hospitalization Follow-up   Subjective:    HPI  Follow up Hospitalization  Patient was admitted to [facility] on 12/10/17 and discharged on 12/12/17. She was treated for STEMI. Treatment for this included labs, EKG, cardiac catheterization. Telephone follow up was done on 12/13/17. She reports excellent compliance with treatment. She reports this condition is Improved. Patient reports taking 1/2 tablet of Lasix due to weight gain. Patient reports she has not been taking Lisinopril due to low blood pressure readings.  She also did not start atorvastatin. She is wanting to talk with [facility] before starting.  Left heart catheterization was negative for coronary blockages. Had recently started Niraparib and had been on this for only 9 days prior to the event. It is felt this may have contributed to more of a coronary spasm that resulted in changes. This has since been discontinued.  There has been no recurrence of symptoms.  ------------------------------------------------------------------------------------     Allergies  Allergen Reactions  . Carafate [Sucralfate] Rash  . Sulfa Antibiotics Rash     Current Outpatient Medications:  .  acetaminophen (TYLENOL) 500 MG tablet, Take 500 mg by mouth every 6 (six) hours as needed., Disp: , Rfl:  .  aspirin EC 325 MG tablet, Take 1 tablet (325 mg total) by mouth daily., Disp: 100 tablet, Rfl: 3 .  Cholecalciferol (VITAMIN D) 2000 units tablet, Take 1 tablet (2,000 Units total) by mouth daily., Disp: 30 tablet, Rfl: 12 .  Docosanol (ABREVA EX), Apply 1 application topically as needed. Fever blisters, Disp: , Rfl:  .  furosemide (LASIX) 20 MG tablet, Take 0.5 tablets (10 mg total) by mouth daily., Disp: 30 tablet, Rfl: 0 .  levothyroxine  (SYNTHROID, LEVOTHROID) 50 MCG tablet, TAKE 1 TABLET EVERY DAY ON AN EMPTY STOMACH WITH A GLASS OF WATER AT LEAST 30-60 MINUTES BEFORE BREAKFAST, Disp: 30 tablet, Rfl: 12 .  lidocaine-prilocaine (EMLA) cream, Apply to port area 30-45 mins prior to chemo., Disp: 30 g, Rfl: 5 .  LORazepam (ATIVAN) 0.5 MG tablet, Take 0.5 tablets (0.25 mg total) by mouth at bedtime., Disp: 30 tablet, Rfl: 4 .  Lysine 500 MG TABS, Take 1 tablet by mouth daily as needed. , Disp: , Rfl:  .  magic mouthwash w/lidocaine SOLN, Take 5 mLs by mouth every 4 (four) hours as needed., Disp: , Rfl: 1 .  Multiple Vitamin (MULTIVITAMIN) capsule, Take 1 capsule by mouth daily., Disp: , Rfl:  .  ondansetron (ZOFRAN) 4 MG tablet, One pill 30-60 mins prior to taking zejula to prevent nausea/vomitting., Disp: 45 tablet, Rfl: 3 .  pantoprazole (PROTONIX) 40 MG tablet, Take 1 tablet (40 mg total) by mouth daily., Disp: 30 tablet, Rfl: 1 .  propafenone (RYTHMOL) 225 MG tablet, Take 225 mg by mouth 2 (two) times daily., Disp: , Rfl:  .  alum & mag hydroxide-simeth (MAALOX/MYLANTA) 200-200-20 MG/5ML suspension, Take 15 mLs by mouth every 6 (six) hours as needed for indigestion or heartburn. (Patient not taking: Reported on 12/13/2017), Disp: 355 mL, Rfl: 0 .  atorvastatin (LIPITOR) 40 MG tablet, Take 1 tablet (40 mg total) by mouth daily at 6 PM. (Patient not taking: Reported on 12/13/2017), Disp: 30 tablet, Rfl: 0 .  lisinopril (PRINIVIL,ZESTRIL) 10 MG tablet, Take 1 tablet (10  mg total) by mouth daily. (Patient not taking: Reported on 12/18/2017), Disp: 90 tablet, Rfl: 4 .  traMADol (ULTRAM) 50 MG tablet, Take 1 tablet (50 mg total) by mouth every 6 (six) hours as needed. (Patient not taking: Reported on 11/09/2017), Disp: 30 tablet, Rfl: 0  Review of Systems  Constitutional: Negative.   HENT: Negative.   Respiratory: Positive for cough.   Cardiovascular: Negative.   Gastrointestinal: Negative.   Musculoskeletal: Negative.   Neurological:  Negative.     Social History   Tobacco Use  . Smoking status: Never Smoker  . Smokeless tobacco: Never Used  Substance Use Topics  . Alcohol use: No   Objective:   BP 110/72 (BP Location: Left Arm, Patient Position: Sitting, Cuff Size: Normal)   Pulse 90   Temp 97.7 F (36.5 C) (Oral)   Resp 16   Wt 190 lb (86.2 kg)   SpO2 99%   BMI 32.61 kg/m  Vitals:   12/18/17 0910  BP: 110/72  Pulse: 90  Resp: 16  Temp: 97.7 F (36.5 C)  TempSrc: Oral  SpO2: 99%  Weight: 190 lb (86.2 kg)     Physical Exam  Constitutional: She appears well-developed and well-nourished. No distress.  Neck: Normal range of motion. Neck supple.  Cardiovascular: Normal rate, regular rhythm and normal heart sounds. Exam reveals no gallop and no friction rub.  No murmur heard. Pulmonary/Chest: Effort normal and breath sounds normal. No respiratory distress. She has no wheezes. She has no rales.  Musculoskeletal: She exhibits edema (trace pitting edema bilaterally).  Skin: She is not diaphoretic.  Vitals reviewed.      Assessment & Plan:     1. Transition of care performed with sharing of clinical summary Notes, labs, procedures and images reviewed from hospitalization on 12/10/17-12/12/17. Transition call attempted on 12/13/17.   2. ST elevation myocardial infarction (STEMI), unspecified artery ([place]) Unsure if true STEMI since catheterization was negative. Discussed possibly having nitroglycerin on hand in the future but she wishes to see Dr. Cammie Sickle and discuss this further. She is also waiting until her appt next Tuesday with him about starting the atorvastatin. Advised to continue ASA 325mg . Also BP continues to be on the lower side despite not taking lisinopril. Continue to hold lisinopril until seen by Dr. Nehemiah Massed to see if BP increases. Patient in agreement. With plan.   3. Acute heart failure, unspecified heart failure type (HCC) Continue furosemide 10mg . If weight is up more than 3 pounds  take full tab. If up more than 5 pounds call Dr. Alveria Apley office.        Mar Daring, PA-C  [facility]

## 2017-12-18 ENCOUNTER — Encounter: Payer: Self-pay | Admitting: Physician Assistant

## 2017-12-18 ENCOUNTER — Ambulatory Visit (INDEPENDENT_AMBULATORY_CARE_PROVIDER_SITE_OTHER): Payer: PPO | Admitting: Physician Assistant

## 2017-12-18 ENCOUNTER — Other Ambulatory Visit: Payer: Self-pay

## 2017-12-18 VITALS — BP 110/72 | HR 90 | Temp 97.7°F | Resp 16 | Wt 190.0 lb

## 2017-12-18 DIAGNOSIS — IMO0001 Reserved for inherently not codable concepts without codable children: Secondary | ICD-10-CM

## 2017-12-18 DIAGNOSIS — Z9189 Other specified personal risk factors, not elsewhere classified: Secondary | ICD-10-CM | POA: Diagnosis not present

## 2017-12-18 DIAGNOSIS — I213 ST elevation (STEMI) myocardial infarction of unspecified site: Secondary | ICD-10-CM

## 2017-12-18 DIAGNOSIS — I509 Heart failure, unspecified: Secondary | ICD-10-CM

## 2017-12-18 NOTE — Patient Outreach (Signed)
[facility]) Care Management  12/18/2017  Jamie Conner June 15, 1941 [id number]  EMMI: General discharge red alert Referral date: 12/18/17 Referral reason: unfilled prescriptions: YES,  Able to fill: NO Insurance: health team advantage Day # 4  Telephone call to patient regarding EMMI general discharge red alert.  HIPAA verified with patient. Explained reason for call. Patient states she has one medication she did not get filled which was Lipitor. Patient states she is not going to get this filled until she talks with her cardiologist. Patient states she saw her primary care provider today and she was ok with her not getting the medication filled until she sees her cardiologist.  Patient states her cardilogy appointment is scheduled for 12/24/17.   Patient states she has all of her other medication and is taking them as prescribed. Patient states she is independent in her care. Can provide her own transportation. Patient denies any chest pain or other symptoms. Patient denies having any needs or concerns at this time.  RNCM advised patient to notify MD of any changes in condition prior to scheduled appointment. RNCM verified patient aware of 911 services for urgent/ emergent needs.  PLAN: RNCM will close patient due to patient being assessed and having no further needs.  RNCM will send patient [facility] care management brochure/ magnet.   Quinn Plowman RN,BSN,CCM [facility] Telephonic  [phone]

## 2017-12-20 ENCOUNTER — Encounter: Payer: Self-pay | Admitting: Nurse Practitioner

## 2017-12-24 DIAGNOSIS — E782 Mixed hyperlipidemia: Secondary | ICD-10-CM | POA: Diagnosis not present

## 2017-12-24 DIAGNOSIS — I214 Non-ST elevation (NSTEMI) myocardial infarction: Secondary | ICD-10-CM | POA: Diagnosis not present

## 2017-12-24 DIAGNOSIS — I480 Paroxysmal atrial fibrillation: Secondary | ICD-10-CM | POA: Diagnosis not present

## 2017-12-24 DIAGNOSIS — Z9889 Other specified postprocedural states: Secondary | ICD-10-CM | POA: Diagnosis not present

## 2017-12-24 DIAGNOSIS — I10 Essential (primary) hypertension: Secondary | ICD-10-CM | POA: Diagnosis not present

## 2017-12-25 NOTE — Telephone Encounter (Signed)
Scheduled for 02/06/18 (awv and cpe).

## 2017-12-27 ENCOUNTER — Encounter: Payer: Self-pay | Admitting: Internal Medicine

## 2017-12-27 ENCOUNTER — Inpatient Hospital Stay: Payer: PPO | Attending: Internal Medicine | Admitting: Internal Medicine

## 2017-12-27 ENCOUNTER — Telehealth: Payer: Self-pay | Admitting: Pharmacist

## 2017-12-27 ENCOUNTER — Other Ambulatory Visit: Payer: Self-pay

## 2017-12-27 ENCOUNTER — Telehealth: Payer: Self-pay | Admitting: Internal Medicine

## 2017-12-27 VITALS — BP 150/81 | HR 62 | Temp 97.9°F | Resp 20 | Ht 64.0 in | Wt 187.0 lb

## 2017-12-27 DIAGNOSIS — C786 Secondary malignant neoplasm of retroperitoneum and peritoneum: Secondary | ICD-10-CM | POA: Insufficient documentation

## 2017-12-27 DIAGNOSIS — R1013 Epigastric pain: Secondary | ICD-10-CM | POA: Diagnosis not present

## 2017-12-27 DIAGNOSIS — I252 Old myocardial infarction: Secondary | ICD-10-CM | POA: Insufficient documentation

## 2017-12-27 DIAGNOSIS — K219 Gastro-esophageal reflux disease without esophagitis: Secondary | ICD-10-CM | POA: Diagnosis not present

## 2017-12-27 DIAGNOSIS — Z9223 Personal history of estrogen therapy: Secondary | ICD-10-CM | POA: Insufficient documentation

## 2017-12-27 DIAGNOSIS — Z800 Family history of malignant neoplasm of digestive organs: Secondary | ICD-10-CM | POA: Insufficient documentation

## 2017-12-27 DIAGNOSIS — Z90722 Acquired absence of ovaries, bilateral: Secondary | ICD-10-CM | POA: Diagnosis not present

## 2017-12-27 DIAGNOSIS — Z853 Personal history of malignant neoplasm of breast: Secondary | ICD-10-CM | POA: Insufficient documentation

## 2017-12-27 DIAGNOSIS — G629 Polyneuropathy, unspecified: Secondary | ICD-10-CM | POA: Diagnosis not present

## 2017-12-27 DIAGNOSIS — E785 Hyperlipidemia, unspecified: Secondary | ICD-10-CM | POA: Insufficient documentation

## 2017-12-27 DIAGNOSIS — L271 Localized skin eruption due to drugs and medicaments taken internally: Secondary | ICD-10-CM | POA: Insufficient documentation

## 2017-12-27 DIAGNOSIS — Z9221 Personal history of antineoplastic chemotherapy: Secondary | ICD-10-CM | POA: Diagnosis not present

## 2017-12-27 DIAGNOSIS — I481 Persistent atrial fibrillation: Secondary | ICD-10-CM | POA: Diagnosis not present

## 2017-12-27 DIAGNOSIS — R140 Abdominal distension (gaseous): Secondary | ICD-10-CM | POA: Insufficient documentation

## 2017-12-27 DIAGNOSIS — E039 Hypothyroidism, unspecified: Secondary | ICD-10-CM | POA: Insufficient documentation

## 2017-12-27 DIAGNOSIS — C569 Malignant neoplasm of unspecified ovary: Secondary | ICD-10-CM | POA: Insufficient documentation

## 2017-12-27 DIAGNOSIS — R5381 Other malaise: Secondary | ICD-10-CM | POA: Insufficient documentation

## 2017-12-27 DIAGNOSIS — I429 Cardiomyopathy, unspecified: Secondary | ICD-10-CM | POA: Insufficient documentation

## 2017-12-27 DIAGNOSIS — Z79899 Other long term (current) drug therapy: Secondary | ICD-10-CM | POA: Insufficient documentation

## 2017-12-27 DIAGNOSIS — I10 Essential (primary) hypertension: Secondary | ICD-10-CM | POA: Diagnosis not present

## 2017-12-27 DIAGNOSIS — R531 Weakness: Secondary | ICD-10-CM | POA: Insufficient documentation

## 2017-12-27 DIAGNOSIS — R5383 Other fatigue: Secondary | ICD-10-CM | POA: Diagnosis not present

## 2017-12-27 DIAGNOSIS — R600 Localized edema: Secondary | ICD-10-CM | POA: Insufficient documentation

## 2017-12-27 DIAGNOSIS — Z803 Family history of malignant neoplasm of breast: Secondary | ICD-10-CM | POA: Insufficient documentation

## 2017-12-27 DIAGNOSIS — Z90710 Acquired absence of both cervix and uterus: Secondary | ICD-10-CM | POA: Insufficient documentation

## 2017-12-27 DIAGNOSIS — Z923 Personal history of irradiation: Secondary | ICD-10-CM | POA: Insufficient documentation

## 2017-12-27 DIAGNOSIS — K5909 Other constipation: Secondary | ICD-10-CM | POA: Insufficient documentation

## 2017-12-27 DIAGNOSIS — Z7982 Long term (current) use of aspirin: Secondary | ICD-10-CM | POA: Diagnosis not present

## 2017-12-27 DIAGNOSIS — I700 Atherosclerosis of aorta: Secondary | ICD-10-CM | POA: Insufficient documentation

## 2017-12-27 DIAGNOSIS — R0609 Other forms of dyspnea: Secondary | ICD-10-CM | POA: Insufficient documentation

## 2017-12-27 MED ORDER — OLAPARIB 150 MG PO TABS: 300.0000 mg | ORAL_TABLET | Freq: Two times a day (BID) | ORAL | 3 refills | Status: DC

## 2017-12-27 NOTE — Assessment & Plan Note (Addendum)
#  RECURRENT PLATINUM REFRACTORY HIGH GRADE SEROUS ovarian cancer- march 8th CT/PET- Progression; with possible cecal implants; increasing vaginal cuff thickness.CA-125-39.   #Patient was recently on Zejula; however discontinued because of the recent cardiac adverse event [see discussion below].  Recommend switching to another PRP inhibitor-like Lynparza.  300 mg twice a day.  New prescription initiated; not be shipped.   Again discussed the potential side effects.  # Elevated Blood pressure-improved; continue lisinopril 59m/day. STABLE.   # Molecular testing/NGS-shows homozygous BRCA copy loss; but no mutation.   #Decreased ejection fraction/chest pain-question Takasubo.  The etiology is not clear-no significant correlation with Zejula and cardiac event.  I would recommend repeating a 2D echo prior to starting [place]  I tried to reach the office it was closed.  Patient has appointment with cardiology the next 10 days also.  #Recommend follow-up in approximately 2 weeks/CBC CMP CA 1-5/port flush

## 2017-12-27 NOTE — Progress Notes (Signed)
[place] OFFICE PROGRESS NOTE  Patient Care Team: Jerrol Banana., MD as PCP - General (Family Medicine) Gillis Ends, MD as Referring Physician (Obstetrics and Gynecology) Bary Castilla, Forest Gleason, MD as Consulting Physician (General Surgery) Cammie Sickle, MD as Consulting Physician (Oncology) Corey Skains, MD as Consulting Physician (Cardiology)  Cancer Staging Malignant neoplasm of ovary [facility]) Staging form: Ovary, AJCC 7th Edition - Clinical: Stage IIIC (T3c, N0, M0) - Unsigned    Oncology History   # April- MAY 2016- HIGH GRADE SEROUS OVARIAN CANCER STAGE IIIC; CA-125 +2300+;  s/p OPTIMAL DEBULKING SURGERY   # MAY 2016[name] [finished in Sep 19th 2017]  # MAY CT 2017- RECURRENT/Peritoneal carcinomatosis/pelvic implant ~1.2cm/Ca-125-34;   # July 3rd 2017- CARBO-DOXIL q 4W [with neulasta]; CT May 11 2016- slight increase peritoneal / stable nodules. Cont chemo [finished DEC 2017]. DEC 28th 2017 CT- Increase in peritoneal deposits; increasing Ca 125 [60s];  # Jan 2018- carbo-Taxol-Avastin x6 cycles; Aug 2018- CR; Aug 2018- start Avastin Maintenance; MARCH 2019- PROGRESSION- STOP avastin.   # MID April 2019- Zejula [300 mg/day]; 4 days- later STOPPED sec to Chest pain   # May 2019- TAKASUBO ? [s/p cardiac cath- Dr.Kowalski]  # G-1-2 hand foot syndrome-   # LEFT BREAST CA s/p Lumpec & RT s/p TAM   # Afib [cardiology]; JUNE 2017- MUGA 62%  Jan 2018- MOLECULAR TESTING- ATM DELETERIOUS HETEROZYGOUS mutation; TUMOR BRCA- NEG; Myriad genomic instability- NEGATIVE;   # NGS/Omniseq- BRCA-2 copy loss/ * [april2019]; NEG- BRCA Mutations/N-TRK/MSI-STABLE.      Malignant neoplasm of ovary (HCC)    Ovarian cancer, unspecified laterality ([place])      INTERVAL HISTORY:  Jamie Conner 77 y.o.  female pleasant patient above history of metastatic/platinum refractory ovarian cancer is here for follow-up.  Patient was admitted  to the hospital just after 3 days of starting Zejula-significant chest pain.  Patient a cardiac catheter was negative for CAD; however noted to have depressed ejection fraction of 35% [?  Takotsubo].  CT of the chest was negative for PE.  Patient was recently evaluated cardiology; Dr. Nehemiah Massed.  Patient is currently home doing well.  Appetite is improved.  Denies any unusual chest pain or shortness of breath or cough.  Denies abdominal pain.  Denies any swelling of the legs.  Review of Systems  Constitutional: Negative for chills, diaphoresis, fever, malaise/fatigue and weight loss.  HENT: Negative for nosebleeds and sore throat.   Eyes: Negative for double vision.  Respiratory: Negative for cough, hemoptysis, sputum production, shortness of breath and wheezing.   Cardiovascular: Negative for chest pain, palpitations, orthopnea and leg swelling.  Gastrointestinal: Negative for abdominal pain, blood in stool, constipation, diarrhea, heartburn, melena, nausea and vomiting.  Genitourinary: Negative for dysuria, frequency and urgency.  Musculoskeletal: Negative for back pain and joint pain.  Skin: Negative.  Negative for itching and rash.  Neurological: Negative for dizziness, tingling, focal weakness, weakness and headaches.  Endo/Heme/Allergies: Does not bruise/bleed easily.  Psychiatric/Behavioral: Negative for depression. The patient is not nervous/anxious and does not have insomnia.       PAST MEDICAL HISTORY :  Past Medical History:  Diagnosis Date  . Atrial fibrillation ([place])   . Breast cancer ([place]) 1998  . Cancer of breast ([place]) 1998   Left/radiation  . CINV (chemotherapy-induced nausea and vomiting)   . GERD (gastroesophageal reflux disease)   . Hyperlipidemia   . Hypothyroidism   . Mitral insufficiency   .  Ovarian cancer ([place])    s/p BSO optimal tumor debulking May 2016/chemo  . Personal history of chemotherapy since 2016   ovarian cancer  . Personal history of radiation  therapy 1998  . PVC (premature ventricular contraction)     PAST SURGICAL HISTORY :   Past Surgical History:  Procedure Laterality Date  . ABDOMINAL HYSTERECTOMY    . BILATERAL SALPINGOOPHORECTOMY  May 2016   with optimal tumor debulking   . BREAST LUMPECTOMY Left 1998  . BREAST SURGERY     Left  . CESAREAN SECTION     x2  . CHOLECYSTECTOMY    . COLONOSCOPY  2006  . DEBULKING N/A 12/22/2014   Procedure: DEBULKING;  Surgeon: Gillis Ends, MD;  Location: [facility] ORS;  Service: Gynecology;  Laterality: N/A;  . LAPAROTOMY N/A 12/22/2014   Procedure: EXPLORATORY LAPAROTOMY;  Surgeon: Robert Bellow, MD;  Location: [facility] ORS;  Service: General;  Laterality: N/A;  . LAPAROTOMY WITH STAGING N/A 12/22/2014   Procedure: LAPAROTOMY WITH STAGING;  Surgeon: Gillis Ends, MD;  Location: [facility] ORS;  Service: Gynecology;  Laterality: N/A;  . LEFT HEART CATH AND CORONARY ANGIOGRAPHY N/A 12/10/2017   Procedure: LEFT HEART CATH AND CORONARY ANGIOGRAPHY;  Surgeon: Yolonda Kida, MD;  Location: [facility] CV LAB;  Service: Cardiovascular;  Laterality: N/A;  . PERIPHERAL VASCULAR CATHETERIZATION Left 12/22/2014   Procedure: PORTA CATH INSERTION;  Surgeon: Robert Bellow, MD;  Location: [facility] ORS;  Service: General;  Laterality: Left;  . PORTACATH PLACEMENT Right 12/22/2014   Procedure: INSERTION PORT-A-CATH;  Surgeon: Robert Bellow, MD;  Location: [facility] ORS;  Service: General;  Laterality: Right;  . SALPINGOOPHORECTOMY Bilateral 12/22/2014   Procedure: SALPINGO OOPHORECTOMY;  Surgeon: Gillis Ends, MD;  Location: [facility] ORS;  Service: Gynecology;  Laterality: Bilateral;  . UPPER GI ENDOSCOPY  09/25/04   hiatus hernia, schatzki ring and a single gastric polyp    FAMILY HISTORY :   Family History  Problem Relation Age of Onset  . Cancer Mother        breast, throat, and stomach  . Breast cancer Mother   . Heart disease Father   . Pulmonary embolism Sister   . Cancer Brother  60       angiosarcoma of the chest; carcinoid small intestinal tumor  . Hypertension Brother   . Cancer Maternal Aunt        breast cancer  . Cancer Maternal Grandfather 37       pancreatic    SOCIAL HISTORY:   Social History   Tobacco Use  . Smoking status: Never Smoker  . Smokeless tobacco: Never Used  Substance Use Topics  . Alcohol use: No  . Drug use: No    ALLERGIES:  is allergic to carafate [sucralfate] and sulfa antibiotics.  MEDICATIONS:  Current Outpatient Medications  Medication Sig Dispense Refill  . acetaminophen (TYLENOL) 500 MG tablet Take 500 mg by mouth every 6 (six) hours as needed.    Marland Kitchen aspirin EC 325 MG tablet Take 1 tablet (325 mg total) by mouth daily. 100 tablet 3  . Cholecalciferol (VITAMIN D) 2000 units tablet Take 1 tablet (2,000 Units total) by mouth daily. 30 tablet 12  . levothyroxine (SYNTHROID, LEVOTHROID) 50 MCG tablet TAKE 1 TABLET EVERY DAY ON AN EMPTY STOMACH WITH A GLASS OF WATER AT LEAST 30-60 MINUTES BEFORE BREAKFAST 30 tablet 12  . LORazepam (ATIVAN) 0.5 MG tablet Take 0.5 tablets (0.25 mg total) by mouth at bedtime. [place]  tablet 4  . Lysine 500 MG TABS Take 1 tablet by mouth daily as needed.     . metoprolol succinate (TOPROL-XL) 25 MG 24 hr tablet Take 1 tablet by mouth daily.    . Multiple Vitamin (MULTIVITAMIN) capsule Take 1 capsule by mouth daily.    . pantoprazole (PROTONIX) 40 MG tablet Take 1 tablet (40 mg total) by mouth daily. 30 tablet 1  . alum & mag hydroxide-simeth (MAALOX/MYLANTA) 200-200-20 MG/5ML suspension Take 15 mLs by mouth every 6 (six) hours as needed for indigestion or heartburn. (Patient not taking: Reported on 12/13/2017) 355 mL 0  . Docosanol (ABREVA EX) Apply 1 application topically as needed. Fever blisters    . furosemide (LASIX) 20 MG tablet Take 0.5 tablets (10 mg total) by mouth daily. (Patient not taking: Reported on 12/27/2017) 30 tablet 0  . lidocaine-prilocaine (EMLA) cream Apply to port area 30-45 mins prior  to chemo. (Patient not taking: Reported on 12/27/2017) 30 g 5  . magic mouthwash w/lidocaine SOLN Take 5 mLs by mouth every 4 (four) hours as needed.  1  . olaparib (LYNPARZA) 150 MG tablet Take 2 tablets (300 mg total) by mouth 2 (two) times daily. Swallow whole. 120 tablet 3  . ondansetron (ZOFRAN) 4 MG tablet One pill 30-60 mins prior to taking zejula to prevent nausea/vomitting. (Patient not taking: Reported on 12/27/2017) 45 tablet 3  . traMADol (ULTRAM) 50 MG tablet Take 1 tablet (50 mg total) by mouth every 6 (six) hours as needed. (Patient not taking: Reported on 11/09/2017) 30 tablet 0   No current facility-administered medications for this visit.     PHYSICAL EXAMINATION: ECOG PERFORMANCE STATUS: 0 - Asymptomatic  BP (!) 150/81 (Patient Position: Sitting)   Pulse 62   Temp 97.9 F (36.6 C) (Tympanic)   Resp 20   Ht _0  (1.626 m)   Wt 187 lb (84.8 kg)   BMI 32.10 kg/m   Filed Weights   12/27/17 1432  Weight: 187 lb (84.8 kg)    GENERAL: Well-nourished well-developed; Alert, no distress and comfortable.  Accompanied by family.  EYES: no pallor or icterus OROPHARYNX: no thrush or ulceration; NECK: supple; no lymph nodes felt. LYMPH:  no palpable lymphadenopathy in the axillary or inguinal regions LUNGS: Decreased breath sounds auscultation bilaterally. No wheeze or crackles HEART/CVS: regular rate & rhythm and no murmurs; No lower extremity edema ABDOMEN:abdomen soft, non-tender and normal bowel sounds. No hepatomegaly or splenomegaly.  Musculoskeletal:no cyanosis of digits and no clubbing  PSYCH: alert & oriented x 3 with fluent speech NEURO: no focal motor/sensory deficits SKIN:  no rashes or significant lesions    LABORATORY DATA:  I have reviewed the data as listed    Component Value Date/Time   NA 132 (L) 12/13/2017 1015   NA 140 12/14/2014 1037   K 4.3 12/13/2017 1015   K 4.4 12/14/2014 1037   CL 102 12/13/2017 1015   CL 106 12/14/2014 1037   CO2 22  12/13/2017 1015   CO2 26 12/14/2014 1037   GLUCOSE 143 (H) 12/13/2017 1015   GLUCOSE 87 12/14/2014 1037   BUN 15 12/13/2017 1015   BUN 12 12/14/2014 1037   CREATININE 1.00 12/13/2017 1015   CREATININE 0.97 12/14/2014 1037   CALCIUM 8.9 12/13/2017 1015   CALCIUM 9.2 12/14/2014 1037   PROT 6.3 (L) 12/13/2017 1015   PROT 7.3 12/14/2014 1037   ALBUMIN 3.2 (L) 12/13/2017 1015   ALBUMIN 3.8 12/14/2014 1037   AST 36 12/13/2017  1015   AST 20 12/14/2014 1037   ALT 23 12/13/2017 1015   ALT 11 (L) 12/14/2014 1037   ALKPHOS 65 12/13/2017 1015   ALKPHOS 138 (H) 12/14/2014 1037   BILITOT 0.5 12/13/2017 1015   BILITOT 0.5 12/14/2014 1037   GFRNONAA 54 (L) 12/13/2017 1015   GFRNONAA 59 (L) 12/14/2014 1037   GFRAA >60 12/13/2017 1015   GFRAA >60 12/14/2014 1037    No results found for: SPEP, UPEP  Lab Results  Component Value Date   WBC 10.8 12/13/2017   NEUTROABS 6.9 (H) 12/13/2017   HGB 13.3 12/13/2017   HCT 38.3 12/13/2017   MCV 95.0 12/13/2017   PLT 183 12/13/2017      Chemistry      Component Value Date/Time   NA 132 (L) 12/13/2017 1015   NA 140 12/14/2014 1037   K 4.3 12/13/2017 1015   K 4.4 12/14/2014 1037   CL 102 12/13/2017 1015   CL 106 12/14/2014 1037   CO2 22 12/13/2017 1015   CO2 26 12/14/2014 1037   BUN 15 12/13/2017 1015   BUN 12 12/14/2014 1037   CREATININE 1.00 12/13/2017 1015   CREATININE 0.97 12/14/2014 1037   GLU 84 03/16/2014      Component Value Date/Time   CALCIUM 8.9 12/13/2017 1015   CALCIUM 9.2 12/14/2014 1037   ALKPHOS 65 12/13/2017 1015   ALKPHOS 138 (H) 12/14/2014 1037   AST 36 12/13/2017 1015   AST 20 12/14/2014 1037   ALT 23 12/13/2017 1015   ALT 11 (L) 12/14/2014 1037   BILITOT 0.5 12/13/2017 1015   BILITOT 0.5 12/14/2014 1037       RADIOGRAPHIC STUDIES: I have personally reviewed the radiological images as listed and agreed with the findings in the report. No results found.   ASSESSMENT & PLAN:  Ovarian cancer, unspecified  laterality ([place]) # RECURRENT PLATINUM REFRACTORY HIGH GRADE SEROUS ovarian cancer- march 8th CT/PET- Progression; with possible cecal implants; increasing vaginal cuff thickness.CA-125-39.   #Patient was recently on Zejula; however discontinued because of the recent cardiac adverse event [see discussion below].  Recommend switching to another PRP inhibitor-like Lynparza.  300 mg twice a day.  New prescription initiated; not be shipped.   Again discussed the potential side effects.  # Elevated Blood pressure-improved; continue lisinopril 30m/day. STABLE.   # Molecular testing/NGS-shows homozygous BRCA copy loss; but no mutation.   #Decreased ejection fraction/chest pain-question Takasubo.  The etiology is not clear-no significant correlation with Zejula and cardiac event.  I would recommend repeating a 2D echo prior to starting [place]  I tried to reach the office it was closed.  Patient has appointment with cardiology the next 10 days also.  #Recommend follow-up in approximately 2 weeks/CBC CMP CA 1-5/port flush   Orders Placed This Encounter  Procedures  . CBC with Differential    Standing Status:   Future    Standing Expiration Date:   12/28/2018  . Comprehensive metabolic panel    Standing Status:   Future    Standing Expiration Date:   12/28/2018  . CA 125    Standing Status:   Future    Standing Expiration Date:   12/28/2018   All questions were answered. The patient knows to call the clinic with any problems, questions or concerns.      GCammie Sickle MD 12/27/2017 5:35 PM

## 2017-12-27 NOTE — Telephone Encounter (Signed)
Please add a port flush at next visit.  Thank you

## 2017-12-30 MED ORDER — OLAPARIB 150 MG PO TABS: 300.0000 mg | ORAL_TABLET | Freq: Two times a day (BID) | ORAL | 3 refills | Status: DC

## 2017-12-30 NOTE — Telephone Encounter (Signed)
RN added [employer] lab encounter for next visit

## 2017-12-30 NOTE — Telephone Encounter (Signed)
Oral Oncology Pharmacist Encounter  Received new prescription for Lynparza (olaparib) for the treatment of Ovarian Cancer recurrent disease after complete response to platinum-based chemotherapy, planned duration until disease progression or unacceptable drug toxicity.  CBC/CMP from 12/13/17 assessed, no relevant lab abnormalities. Prescription dose and frequency assessed. Patient weight >77kg and pltc >150, no baseline dose adjustment needed.  Current medication list in Epic reviewed, no DDIs with [place] identified.  Prescription has been e-scribed to the [facility] for benefits analysis and approval.  Patient education Counseled patient and her husband on administration, dosing, side effects, monitoring, drug-food interactions, safe handling, storage, and disposal. Patient will take 2 tablets (300 mg total) by mouth 2 (two) times daily.  Side effects include but not limited to: Decreased hgb/plt/wbc, N/V, diarrhea, fatigue, abdominal pain.    Reviewed with patient importance of keeping a medication schedule and plan for any missed doses.  Jamie Conner voiced understanding and appreciation. All questions answered.  Oral Oncology Clinic will continue to follow for insurance authorization, copayment issues, and start date.  Provided patient with [facility] [place] [facility] phone number. Patient knows to call the office with questions or concerns. Oral Chemotherapy Navigation Clinic will continue to follow.  Darl Pikes, PharmD, BCPS Hematology/Oncology Clinical Pharmacist [facility] [place] [facility] [phone]  12/30/2017 8:54 AM

## 2017-12-31 ENCOUNTER — Telehealth: Payer: Self-pay | Admitting: Pharmacy Technician

## 2017-12-31 NOTE — Telephone Encounter (Signed)
Oral Oncology [facility] Encounter  Received notification from [employer] that prior authorization for Jamie Conner is required.  PA submitted on CoverMyMeds Key [id number] Status is pending  Oral Oncology Clinic will continue to follow.  Jamie Asa. Jamie Conner, Jamie Conner [place] [phone] 12/31/2017 10:08 AM

## 2018-01-01 NOTE — Telephone Encounter (Signed)
Oral Chemotherapy Pharmacist Encounter   Patient has been approved for her [name] and has funding available to cover her copay. Per Dr. Rogue Bussing, we are to hold on filling the medication until she is seen by her cardiologist and follows up with him. He will let us know when medication should be sent to the patient. Patient aware of the plan.   Darl Pikes, PharmD, BCPS Hematology/Oncology Clinical Pharmacist [facility] [phone]  01/01/2018 11:36 AM

## 2018-01-01 NOTE — Telephone Encounter (Signed)
Oral Oncology Pharmacist Encounter   Prior Authorization for Lynparza (olaparib) has been approved.     PA# [id number] Effective dates: 12/31/17 through 08/19/18   Oral Oncology Clinic will continue to follow.   Darl Pikes, PharmD, BCPS Hematology/Oncology Clinical Pharmacist [facility] [phone]  01/01/2018 10:14 AM

## 2018-01-08 DIAGNOSIS — I480 Paroxysmal atrial fibrillation: Secondary | ICD-10-CM | POA: Diagnosis not present

## 2018-01-08 DIAGNOSIS — I214 Non-ST elevation (NSTEMI) myocardial infarction: Secondary | ICD-10-CM | POA: Diagnosis not present

## 2018-01-08 DIAGNOSIS — I10 Essential (primary) hypertension: Secondary | ICD-10-CM | POA: Diagnosis not present

## 2018-01-10 ENCOUNTER — Inpatient Hospital Stay (HOSPITAL_BASED_OUTPATIENT_CLINIC_OR_DEPARTMENT_OTHER): Payer: PPO | Admitting: Internal Medicine

## 2018-01-10 ENCOUNTER — Inpatient Hospital Stay: Payer: PPO

## 2018-01-10 VITALS — BP 144/79 | HR 69 | Temp 97.8°F | Resp 16 | Wt 189.0 lb

## 2018-01-10 DIAGNOSIS — L271 Localized skin eruption due to drugs and medicaments taken internally: Secondary | ICD-10-CM | POA: Diagnosis not present

## 2018-01-10 DIAGNOSIS — R140 Abdominal distension (gaseous): Secondary | ICD-10-CM

## 2018-01-10 DIAGNOSIS — Z9223 Personal history of estrogen therapy: Secondary | ICD-10-CM

## 2018-01-10 DIAGNOSIS — R1013 Epigastric pain: Secondary | ICD-10-CM | POA: Diagnosis not present

## 2018-01-10 DIAGNOSIS — Z7982 Long term (current) use of aspirin: Secondary | ICD-10-CM

## 2018-01-10 DIAGNOSIS — R5383 Other fatigue: Secondary | ICD-10-CM | POA: Diagnosis not present

## 2018-01-10 DIAGNOSIS — C786 Secondary malignant neoplasm of retroperitoneum and peritoneum: Secondary | ICD-10-CM

## 2018-01-10 DIAGNOSIS — R600 Localized edema: Secondary | ICD-10-CM

## 2018-01-10 DIAGNOSIS — K219 Gastro-esophageal reflux disease without esophagitis: Secondary | ICD-10-CM

## 2018-01-10 DIAGNOSIS — C569 Malignant neoplasm of unspecified ovary: Secondary | ICD-10-CM

## 2018-01-10 DIAGNOSIS — Z90722 Acquired absence of ovaries, bilateral: Secondary | ICD-10-CM

## 2018-01-10 DIAGNOSIS — Z923 Personal history of irradiation: Secondary | ICD-10-CM | POA: Diagnosis not present

## 2018-01-10 DIAGNOSIS — E785 Hyperlipidemia, unspecified: Secondary | ICD-10-CM

## 2018-01-10 DIAGNOSIS — I10 Essential (primary) hypertension: Secondary | ICD-10-CM

## 2018-01-10 DIAGNOSIS — I429 Cardiomyopathy, unspecified: Secondary | ICD-10-CM

## 2018-01-10 DIAGNOSIS — Z9221 Personal history of antineoplastic chemotherapy: Secondary | ICD-10-CM

## 2018-01-10 DIAGNOSIS — Z79899 Other long term (current) drug therapy: Secondary | ICD-10-CM

## 2018-01-10 DIAGNOSIS — Z803 Family history of malignant neoplasm of breast: Secondary | ICD-10-CM

## 2018-01-10 DIAGNOSIS — E039 Hypothyroidism, unspecified: Secondary | ICD-10-CM

## 2018-01-10 DIAGNOSIS — R5381 Other malaise: Secondary | ICD-10-CM

## 2018-01-10 DIAGNOSIS — I481 Persistent atrial fibrillation: Secondary | ICD-10-CM

## 2018-01-10 DIAGNOSIS — Z853 Personal history of malignant neoplasm of breast: Secondary | ICD-10-CM

## 2018-01-10 DIAGNOSIS — Z800 Family history of malignant neoplasm of digestive organs: Secondary | ICD-10-CM

## 2018-01-10 LAB — CBC WITH DIFFERENTIAL/PLATELET
BASOS ABS: 0.1 10*3/uL (ref 0–0.1)
BASOS PCT: 1 %
Eosinophils Absolute: 0.2 10*3/uL (ref 0–0.7)
Eosinophils Relative: 3 %
HEMATOCRIT: 40.7 % (ref 35.0–47.0)
HEMOGLOBIN: 13.7 g/dL (ref 12.0–16.0)
Lymphocytes Relative: 48 %
Lymphs Abs: 3.9 10*3/uL — ABNORMAL HIGH (ref 1.0–3.6)
MCH: 32.5 pg (ref 26.0–34.0)
MCHC: 33.7 g/dL (ref 32.0–36.0)
MCV: 96.2 fL (ref 80.0–100.0)
MONOS PCT: 10 %
Monocytes Absolute: 0.8 10*3/uL (ref 0.2–0.9)
NEUTROS ABS: 3 10*3/uL (ref 1.4–6.5)
NEUTROS PCT: 38 %
Platelets: 216 10*3/uL (ref 150–440)
RBC: 4.23 MIL/uL (ref 3.80–5.20)
RDW: 14.3 % (ref 11.5–14.5)
WBC: 7.9 10*3/uL (ref 3.6–11.0)

## 2018-01-10 LAB — COMPREHENSIVE METABOLIC PANEL
ALK PHOS: 67 U/L (ref 38–126)
ALT: 15 U/L (ref 14–54)
ANION GAP: 7 (ref 5–15)
AST: 23 U/L (ref 15–41)
Albumin: 3.5 g/dL (ref 3.5–5.0)
BUN: 17 mg/dL (ref 6–20)
CALCIUM: 9.3 mg/dL (ref 8.9–10.3)
CO2: 24 mmol/L (ref 22–32)
Chloride: 107 mmol/L (ref 101–111)
Creatinine, Ser: 1.08 mg/dL — ABNORMAL HIGH (ref 0.44–1.00)
GFR calc Af Amer: 57 mL/min — ABNORMAL LOW (ref >60)
GFR calc non Af Amer: 49 mL/min — ABNORMAL LOW (ref >60)
Glucose, Bld: 108 mg/dL — ABNORMAL HIGH (ref 65–99)
Potassium: 4.3 mmol/L (ref 3.5–5.1)
SODIUM: 138 mmol/L (ref 135–145)
Total Bilirubin: 0.4 mg/dL (ref 0.3–1.2)
Total Protein: 6.7 g/dL (ref 6.5–8.1)

## 2018-01-10 MED ORDER — SODIUM CHLORIDE 0.9% FLUSH
10.0000 mL | Freq: Once | INTRAVENOUS | Status: AC
  Administered 2018-01-10: 10 mL via INTRAVENOUS
  Filled 2018-01-10: qty 10

## 2018-01-10 MED ORDER — HEPARIN SOD (PORK) LOCK FLUSH 100 UNIT/ML IV SOLN
500.0000 [IU] | Freq: Once | INTRAVENOUS | Status: AC
  Administered 2018-01-10: 500 [IU] via INTRAVENOUS

## 2018-01-10 NOTE — Assessment & Plan Note (Addendum)
#   RECURRENT PLATINUM REFRACTORY HIGH GRADE SEROUS ovarian cancer- march 8th CT/PET- Progression; with possible cecal implants; increasing vaginal cuff thickness.CA-125-39.  Clinically stable.  #Most recently on Zejula; currently therapy discontinued given cardiac event [see discussion below].  Plan to start Keddie [approved]; plan to start at 150 mg twice a day dose [patient preference]; titrate up based on tolerance.   #Discussed regarding prophylactic antiemetics prior to taking [place]  #Nonischemic cardiomyopathy/drop in ejection fraction question Takotsubo syndrome; awaiting repeat 2D echo on May 30.  Patient will call us with results-will likely plan to start [place] at the time.  Stable  #Swelling in the legs-post Lasix improved.  #Fatigue-grade 1.  Monitor for now.  Stable  #Epigastric discomfort/abdominal bloating-continue PPI/Gas-X/Tums.  Stable  # start Lynpzara 150 BID; netxt theursday [may 30th after 2 d echo]  # follow up with cbc/bmp/X-MD around 13th-14th.

## 2018-01-10 NOTE — Progress Notes (Signed)
[facility] OFFICE PROGRESS NOTE  Patient Care Team: Jerrol Banana., MD as PCP - General (Family Medicine) Gillis Ends, MD as Referring Physician (Obstetrics and Gynecology) Bary Castilla, Forest Gleason, MD as Consulting Physician (General Surgery) Cammie Sickle, MD as Consulting Physician (Oncology) Corey Skains, MD as Consulting Physician (Cardiology)  Cancer Staging Malignant neoplasm of ovary [facility]) Staging form: Ovary, AJCC 7th Edition - Clinical: Stage IIIC (T3c, N0, M0) - Unsigned    Oncology History   # April- MAY 2016- HIGH GRADE SEROUS OVARIAN CANCER STAGE IIIC; CA-125 +2300+;  s/p OPTIMAL DEBULKING SURGERY   # MAY 2016[name] DD [finished in Sep 19th 2017]  # MAY CT 2017- RECURRENT/Peritoneal carcinomatosis/pelvic implant ~1.2cm/Ca-125-34;   # July 3rd 2017- CARBO-DOXIL q 4W [with neulasta]; CT May 11 2016- slight increase peritoneal / stable nodules. Cont chemo [finished DEC 2017]. DEC 28th 2017 CT- Increase in peritoneal deposits; increasing Ca 125 [60s];  # Jan 2018- carbo-Taxol-Avastin x6 cycles; Aug 2018- CR; Aug 2018- start Avastin Maintenance; MARCH 2019- PROGRESSION- STOP avastin.   # MID April 2019- Zejula [300 mg/day]; 4 days- later STOPPED sec to Chest pain   # May 2019- TAKASUBO ? [s/p cardiac cath- Dr.Kowalski]  # G-1-2 hand foot syndrome-   # LEFT BREAST CA s/p Lumpec & RT s/p TAM   # Afib [cardiology]; JUNE 2017- MUGA 62%  Jan 2018- MOLECULAR TESTING- ATM DELETERIOUS HETEROZYGOUS mutation; TUMOR BRCA- NEG; Myriad genomic instability- NEGATIVE;   # NGS/Omniseq- BRCA-2 copy loss/ * [april2019]; NEG- BRCA Mutations/N-TRK/MSI-STABLE.  ---------------------------------------------------------------------    DIAGNOSIS: MAY 2017; RECURRENT OVARIAN CA-high-grade serous/platinum refractory  STAGE: IV        ;GOALS: PALLIATIVE  CURRENT/MOST RECENT THERAPY: Zejula discontinued; awaiting to start Lynparza       Malignant neoplasm of ovary ([place])    Ovarian cancer, unspecified laterality ([place])      INTERVAL HISTORY:  Jamie Conner 77 y.o.  female pleasant patient above history of ovarian cancer is here for follow-up.  Patient was recently evaluated by cardiology; awaiting to have a 2D echo repeated next week.  Patient complained of mild swelling in the legs.  She is taking Lasix improved.  Complains of mild to moderate fatigue.  Review of Systems  Constitutional: Positive for malaise/fatigue. Negative for chills, diaphoresis, fever and weight loss.  HENT: Negative for nosebleeds and sore throat.   Eyes: Negative for double vision.  Respiratory: Negative for cough, hemoptysis, sputum production, shortness of breath and wheezing.   Cardiovascular: Positive for leg swelling. Negative for chest pain, palpitations and orthopnea.  Gastrointestinal: Negative for abdominal pain, blood in stool, constipation, diarrhea, heartburn, melena, nausea and vomiting.  Genitourinary: Negative for dysuria, frequency and urgency.  Musculoskeletal: Negative for back pain and joint pain.  Skin: Negative.  Negative for itching and rash.  Neurological: Negative for dizziness, tingling, focal weakness, weakness and headaches.  Endo/Heme/Allergies: Does not bruise/bleed easily.  Psychiatric/Behavioral: Negative for depression. The patient is not nervous/anxious and does not have insomnia.       PAST MEDICAL HISTORY :  Past Medical History:  Diagnosis Date  . Atrial fibrillation ([place])   . Breast cancer ([place]) 1998  . Cancer of breast ([place]) 1998   Left/radiation  . CINV (chemotherapy-induced nausea and vomiting)   . GERD (gastroesophageal reflux disease)   . Hyperlipidemia   . Hypothyroidism   . Mitral insufficiency   . Ovarian cancer ([place])    s/p BSO optimal tumor debulking May 2016/chemo  .  Personal history of chemotherapy since 2016   ovarian cancer  . Personal history of radiation therapy 1998  .  PVC (premature ventricular contraction)     PAST SURGICAL HISTORY :   Past Surgical History:  Procedure Laterality Date  . ABDOMINAL HYSTERECTOMY    . BILATERAL SALPINGOOPHORECTOMY  May 2016   with optimal tumor debulking   . BREAST LUMPECTOMY Left 1998  . BREAST SURGERY     Left  . CESAREAN SECTION     x2  . CHOLECYSTECTOMY    . COLONOSCOPY  2006  . DEBULKING N/A 12/22/2014   Procedure: DEBULKING;  Surgeon: Gillis Ends, MD;  Location: [facility];  Service: Gynecology;  Laterality: N/A;  . LAPAROTOMY N/A 12/22/2014   Procedure: EXPLORATORY LAPAROTOMY;  Surgeon: Robert Bellow, MD;  Location: [facility];  Service: General;  Laterality: N/A;  . LAPAROTOMY WITH STAGING N/A 12/22/2014   Procedure: LAPAROTOMY WITH STAGING;  Surgeon: Gillis Ends, MD;  Location: [facility];  Service: Gynecology;  Laterality: N/A;  . LEFT HEART CATH AND CORONARY ANGIOGRAPHY N/A 12/10/2017   Procedure: LEFT HEART CATH AND CORONARY ANGIOGRAPHY;  Surgeon: Yolonda Kida, MD;  Location: [facility];  Service: Cardiovascular;  Laterality: N/A;  . PERIPHERAL VASCULAR CATHETERIZATION Left 12/22/2014   Procedure: PORTA CATH INSERTION;  Surgeon: Robert Bellow, MD;  Location: [facility];  Service: General;  Laterality: Left;  . PORTACATH PLACEMENT Right 12/22/2014   Procedure: INSERTION PORT-A-CATH;  Surgeon: Robert Bellow, MD;  Location: [facility];  Service: General;  Laterality: Right;  . SALPINGOOPHORECTOMY Bilateral 12/22/2014   Procedure: SALPINGO OOPHORECTOMY;  Surgeon: Gillis Ends, MD;  Location: [facility];  Service: Gynecology;  Laterality: Bilateral;  . UPPER GI ENDOSCOPY  09/25/04   hiatus hernia, schatzki ring and a single gastric polyp    FAMILY HISTORY :   Family History  Problem Relation Age of Onset  . Cancer Mother        breast, throat, and stomach  . Breast cancer Mother   . Heart disease Father   . Pulmonary embolism Sister   . Cancer Brother 60        angiosarcoma of the chest; carcinoid small intestinal tumor  . Hypertension Brother   . Cancer Maternal Aunt        breast cancer  . Cancer Maternal Grandfather 33       pancreatic    SOCIAL HISTORY:   Social History   Tobacco Use  . Smoking status: Never Smoker  . Smokeless tobacco: Never Used  Substance Use Topics  . Alcohol use: No  . Drug use: No    ALLERGIES:  is allergic to carafate [sucralfate] and sulfa antibiotics.  MEDICATIONS:  Current Outpatient Medications  Medication Sig Dispense Refill  . acetaminophen (TYLENOL) 500 MG tablet Take 500 mg by mouth every 6 (six) hours as needed.    Mar[place] Kitchen alum & mag hydroxide-simeth (MAALOX/MYLANTA) 200-200-20 MG/5ML suspension Take 15 mLs by mouth every 6 (six) hours as needed for indigestion or heartburn. 355 mL 0  . aspirin EC 325 MG tablet Take 1 tablet (325 mg total) by mouth daily. 100 tablet 3  . Cholecalciferol (VITAMIN D) 2000 units tablet Take 1 tablet (2,000 Units total) by mouth daily. 30 tablet 12  . Docosanol (ABREVA EX) Apply 1 application topically as needed. Fever blisters    . furosemide (LASIX) 20 MG tablet Take 0.5 tablets (10 mg total) by mouth daily. 30 tablet 0  .  levothyroxine (SYNTHROID, LEVOTHROID) 50 MCG tablet TAKE 1 TABLET EVERY DAY ON AN EMPTY STOMACH WITH A GLASS OF WATER AT LEAST 30-60 MINUTES BEFORE BREAKFAST 30 tablet 12  . lidocaine-prilocaine (EMLA) cream Apply to port area 30-45 mins prior to chemo. 30 g 5  . LORazepam (ATIVAN) 0.5 MG tablet Take 0.5 tablets (0.25 mg total) by mouth at bedtime. 30 tablet 4  . Lysine 500 MG TABS Take 1 tablet by mouth daily as needed.     . magic mouthwash w/lidocaine SOLN Take 5 mLs by mouth every 4 (four) hours as needed.  1  . metoprolol succinate (TOPROL-XL) 25 MG 24 hr tablet Take 1 tablet by mouth daily.    . Multiple Vitamin (MULTIVITAMIN) capsule Take 1 capsule by mouth daily.    . ondansetron (ZOFRAN) 4 MG tablet One pill 30-60 mins prior to taking zejula to  prevent nausea/vomitting. 45 tablet 3  . pantoprazole (PROTONIX) 40 MG tablet Take 1 tablet (40 mg total) by mouth daily. 30 tablet 1  . traMADol (ULTRAM) 50 MG tablet Take 1 tablet (50 mg total) by mouth every 6 (six) hours as needed. 30 tablet 0  . olaparib (LYNPARZA) 150 MG tablet Take 2 tablets (300 mg total) by mouth 2 (two) times daily. (Patient not taking: Reported on 01/10/2018) 120 tablet 3   No current facility-administered medications for this visit.     PHYSICAL EXAMINATION: ECOG PERFORMANCE STATUS: 0 - Asymptomatic  BP (!) 144/79 (BP Location: Left Arm, Patient Position: Sitting)   Pulse 69   Temp 97.8 F (36.6 C) (Tympanic)   Resp 16   Wt 189 lb (85.7 kg)   BMI 32.44 kg/m   Filed Weights   01/10/18 1422  Weight: 189 lb (85.7 kg)    GENERAL: Well-nourished well-developed; Alert, no distress and comfortable.  Alone. EYES: no pallor or icterus OROPHARYNX: no thrush or ulceration; NECK: supple; no lymph nodes felt. LYMPH:  no palpable lymphadenopathy in the axillary or inguinal regions LUNGS: Decreased breath sounds auscultation bilaterally. No wheeze or crackles HEART/CVS: regular rate & rhythm and no murmurs; No lower extremity edema ABDOMEN:abdomen soft, non-tender and normal bowel sounds. No hepatomegaly or splenomegaly.  Musculoskeletal:no cyanosis of digits and no clubbing  PSYCH: alert & oriented x 3 with fluent speech NEURO: no focal motor/sensory deficits SKIN:  no rashes or significant lesions    LABORATORY DATA:  I have reviewed the data as listed    Component Value Date/Time   NA 138 01/10/2018 1346   NA 140 12/14/2014 1037   K 4.3 01/10/2018 1346   K 4.4 12/14/2014 1037   CL 107 01/10/2018 1346   CL 106 12/14/2014 1037   CO2 24 01/10/2018 1346   CO2 26 12/14/2014 1037   GLUCOSE 108 (H) 01/10/2018 1346   GLUCOSE 87 12/14/2014 1037   BUN 17 01/10/2018 1346   BUN 12 12/14/2014 1037   CREATININE 1.08 (H) 01/10/2018 1346   CREATININE 0.97  12/14/2014 1037   CALCIUM 9.3 01/10/2018 1346   CALCIUM 9.2 12/14/2014 1037   PROT 6.7 01/10/2018 1346   PROT 7.3 12/14/2014 1037   ALBUMIN 3.5 01/10/2018 1346   ALBUMIN 3.8 12/14/2014 1037   AST 23 01/10/2018 1346   AST 20 12/14/2014 1037   ALT 15 01/10/2018 1346   ALT 11 (L) 12/14/2014 1037   ALKPHOS 67 01/10/2018 1346   ALKPHOS 138 (H) 12/14/2014 1037   BILITOT 0.4 01/10/2018 1346   BILITOT 0.5 12/14/2014 1037   GFRNONAA 49 (  L) 01/10/2018 1346   GFRNONAA 59 (L) 12/14/2014 1037   GFRAA 57 (L) 01/10/2018 1346   GFRAA >60 12/14/2014 1037    No results found for: SPEP, UPEP  Lab Results  Component Value Date   WBC 7.9 01/10/2018   NEUTROABS 3.0 01/10/2018   HGB 13.7 01/10/2018   HCT 40.7 01/10/2018   MCV 96.2 01/10/2018   PLT 216 01/10/2018      Chemistry      Component Value Date/Time   NA 138 01/10/2018 1346   NA 140 12/14/2014 1037   K 4.3 01/10/2018 1346   K 4.4 12/14/2014 1037   CL 107 01/10/2018 1346   CL 106 12/14/2014 1037   CO2 24 01/10/2018 1346   CO2 26 12/14/2014 1037   BUN 17 01/10/2018 1346   BUN 12 12/14/2014 1037   CREATININE 1.08 (H) 01/10/2018 1346   CREATININE 0.97 12/14/2014 1037   GLU 84 03/16/2014      Component Value Date/Time   CALCIUM 9.3 01/10/2018 1346   CALCIUM 9.2 12/14/2014 1037   ALKPHOS 67 01/10/2018 1346   ALKPHOS 138 (H) 12/14/2014 1037   AST 23 01/10/2018 1346   AST 20 12/14/2014 1037   ALT 15 01/10/2018 1346   ALT 11 (L) 12/14/2014 1037   BILITOT 0.4 01/10/2018 1346   BILITOT 0.5 12/14/2014 1037       RADIOGRAPHIC STUDIES: I have personally reviewed the radiological images as listed and agreed with the findings in the report. No results found.   ASSESSMENT & PLAN:  Ovarian cancer, unspecified laterality ([place]) # RECURRENT PLATINUM REFRACTORY HIGH GRADE SEROUS ovarian cancer- march 8th CT/PET- Progression; with possible cecal implants; increasing vaginal cuff thickness.CA-125-39.  Clinically stable.  #Most  recently on Zejula; currently therapy discontinued given cardiac event [see discussion below].  Plan to start [place] [approved]; plan to start at 150 mg twice a day dose [patient preference]; titrate up based on tolerance.   #Discussed regarding prophylactic antiemetics prior to taking [place]  #Nonischemic cardiomyopathy/drop in ejection fraction question Takotsubo syndrome; awaiting repeat 2D echo on May 30.  Patient will call us with results-will likely plan to start [place] at the time.  Stable  #Swelling in the legs-post Lasix improved.  #Fatigue-grade 1.  Monitor for now.  Stable  #Epigastric discomfort/abdominal bloating-continue PPI/Gas-X/Tums.  Stable  # start Lynpzara 150 BID; netxt theursday [may 30th after 2 d echo]  # follow up with cbc/bmp/X-MD around 13th-14th.   No orders of the defined types were placed in this encounter.  All questions were answered. The patient knows to call the clinic with any problems, questions or concerns.      Cammie Sickle, MD 01/10/2018 5:52 PM

## 2018-01-11 LAB — CA 125: CANCER ANTIGEN (CA) 125: 90.4 U/mL — AB (ref 0.0–38.1)

## 2018-01-15 ENCOUNTER — Inpatient Hospital Stay (HOSPITAL_BASED_OUTPATIENT_CLINIC_OR_DEPARTMENT_OTHER): Payer: PPO | Admitting: Nurse Practitioner

## 2018-01-15 VITALS — BP 129/82 | HR 87 | Temp 97.6°F | Resp 18 | Ht 64.0 in | Wt 188.8 lb

## 2018-01-15 DIAGNOSIS — I700 Atherosclerosis of aorta: Secondary | ICD-10-CM

## 2018-01-15 DIAGNOSIS — Z90710 Acquired absence of both cervix and uterus: Secondary | ICD-10-CM

## 2018-01-15 DIAGNOSIS — Z853 Personal history of malignant neoplasm of breast: Secondary | ICD-10-CM

## 2018-01-15 DIAGNOSIS — Z923 Personal history of irradiation: Secondary | ICD-10-CM

## 2018-01-15 DIAGNOSIS — R531 Weakness: Secondary | ICD-10-CM

## 2018-01-15 DIAGNOSIS — R0609 Other forms of dyspnea: Secondary | ICD-10-CM

## 2018-01-15 DIAGNOSIS — Z90722 Acquired absence of ovaries, bilateral: Secondary | ICD-10-CM

## 2018-01-15 DIAGNOSIS — C786 Secondary malignant neoplasm of retroperitoneum and peritoneum: Secondary | ICD-10-CM

## 2018-01-15 DIAGNOSIS — I252 Old myocardial infarction: Secondary | ICD-10-CM | POA: Diagnosis not present

## 2018-01-15 DIAGNOSIS — G629 Polyneuropathy, unspecified: Secondary | ICD-10-CM

## 2018-01-15 DIAGNOSIS — R5383 Other fatigue: Secondary | ICD-10-CM

## 2018-01-15 DIAGNOSIS — C569 Malignant neoplasm of unspecified ovary: Secondary | ICD-10-CM | POA: Diagnosis not present

## 2018-01-15 DIAGNOSIS — Z79899 Other long term (current) drug therapy: Secondary | ICD-10-CM

## 2018-01-15 DIAGNOSIS — K219 Gastro-esophageal reflux disease without esophagitis: Secondary | ICD-10-CM

## 2018-01-15 DIAGNOSIS — I10 Essential (primary) hypertension: Secondary | ICD-10-CM

## 2018-01-15 DIAGNOSIS — Z800 Family history of malignant neoplasm of digestive organs: Secondary | ICD-10-CM

## 2018-01-15 DIAGNOSIS — Z9221 Personal history of antineoplastic chemotherapy: Secondary | ICD-10-CM | POA: Diagnosis not present

## 2018-01-15 DIAGNOSIS — E785 Hyperlipidemia, unspecified: Secondary | ICD-10-CM

## 2018-01-15 DIAGNOSIS — Z982 Presence of cerebrospinal fluid drainage device: Secondary | ICD-10-CM

## 2018-01-15 DIAGNOSIS — E039 Hypothyroidism, unspecified: Secondary | ICD-10-CM

## 2018-01-15 DIAGNOSIS — Z803 Family history of malignant neoplasm of breast: Secondary | ICD-10-CM

## 2018-01-15 DIAGNOSIS — I481 Persistent atrial fibrillation: Secondary | ICD-10-CM

## 2018-01-15 DIAGNOSIS — Z9223 Personal history of estrogen therapy: Secondary | ICD-10-CM

## 2018-01-15 NOTE — Progress Notes (Signed)
Patient c/o internal hemorrids , No new Gyn changes noted today. The patient c/o fatigue, feeling tired, weakness with SOB, The patient also states she has been having numbness, tingling to bilateral feet

## 2018-01-15 NOTE — Progress Notes (Signed)
Gynecologic Oncology Interval Visit   Referring Provider: Blain Pais, MD/ Dr. Rogue Bussing  Chief Concern: Platinum-resistant recurrent stage IIIc ovarian cancer  Subjective:  Jamie Conner is a 77 y.o. female who is seen for recurrent platinum-resistant high grade serous stage IIIc ovarian cancer.   10/23/2017- CT Abdomen/Pelvis 1. There is apparent increase soft tissue thickening around the cecal base which appears new from previous exam. Although nonspecific and can occur for variety of reasons including incomplete distention, serosal deposits reflecting peritoneal disease cannot be excluded. Consider further evaluation with PET-CT.  2. New asymmetric soft tissue thickening along the left side of vaginal cuff. 3. Aortic atherosclerosis  11/04/2017- PET 1. Multiple small hypermetabolic foci of tumor observed along the bowel and pelvic mesentery, as well as the vaginal cuff, compatible with early spread of ovarian cancer. 2. No findings of involvement of the thorax, neck, or skeleton. 3. Diffuse mild thyroid activity favoring mild thyroiditis. 4. Other imaging findings of potential clinical significance: Small type 1 hiatal hernia. Mild cardiomegaly. Aortic Atherosclerosis (ICD10-I70.0). Scattered sigmoid colon diverticula. Posterior right hepatic lobe hemangioma.  Clinical trial at [facility] was discussed and patient declined.   On 12/10/17 she presented to ER with complaints of chest pain. She had a heart catheterization with Dr. Clayborn Bigness which did not reveal any significant blockage of coronary arteries. No significant epicardical coronary disease interior stemi pattern but left ventriculogram suggested large anterior area apically may be consistent with Takutsubo cardiomyopathy. ECHO-LV ef 30-35% (moderate to severely depressed LV function; June 2017 MUGA 62%). Troponins were elevated.  CTA chest negative for PE. Patient discharged on aspirin and statin.  Was instructed to continue lisinopril  10 mg at home.  Beta-blocker was not added due to soft blood pressures.  She has a history of persistent A. fib and Rythmol was continued. She had started Niraparib 9 days prior to admission for platinum-resistant, recurrent stage IIIc ovarian cancer. Dr. Rogue Bussing concerned this could have contributed to spasm. Zejula held per Dr. Rogue Bussing. During admission, she also complained of cough and gastritis. She was given a PPI and Maalox which improved her symptoms.   Per Dr. Rogue Bussing, plan to start on Lynparza 150 mg BID and titrate up based on tolerance.   Today, she has continued fatigue but improved, weakness, and shortness of breath with exertion. Her peripheral neuropathy is stable and confined to her feet. She expressed concern of a possible internal hemorrhoid. She has a hx of chronic constipation and takes miralax and maintains high fiber diet. She reports last colonoscopy in 2006 and was due in 2016. At that time she had internal hemorrhoids and diverticulosis. She does endorse pencil like stools intermittently over past several months. Bowel movement this morning was normal per patient.   CA 125  10/07/17- 25.5 10/28/17- 32.4 11/18/17- 39.4 12/13/17- 33.4 01/10/18- 90.4   Gynecologic Oncology History Jamie Conner is a pleasant female who is seen for postoperative visit for stage IIIc high-grade serous ovarian cancer. See prior notes for complete detail.  She also has a history of  carcinoma of breast ( left) status post lumpectomy , radiation therapy and 5 years of tamoxifen. She underwent exploratory Laparotomy, bilateral salpingo-oophorectomy, right ureterolysis, infragastric omentectomy, and optimally debulked to no gross residual  May 4th, 2016. Preop CA125 2354.0.  She started chemotherapy with carboplatinum and Taxol in dose dense fashion from Jan 02, 2015. She underwent 6 cycles of chemotherapy completed 05/09/2015. Her dose of Taxol was reduced because of myelosuppression and  fever in  spite of Neulasta.  Nadir CA125 = 9.8  06/06/2015 CT scan abdomen and pelvis IMPRESSION: 1. Interval removal of the large right ovarian mass . Dramatic improvement in the appearance of mesenteric and omental implants. There is a 3 mm potential faint omental nodule at about the level of the umbilicus but for the most part the numerous prior omental and mesenteric tumor implants have resolved completely. 2. 1.8 by 1.2 cm fluid density structure along the splenic hilum, no change from prior, likely a small benign cystic lesion.  3. Other imaging findings of potential clinical significance: 3 cm periampullary duodenal diverticulum.   01/04/16 CA125 34.1  01/10/2016 CT scan chest abdomen and pelvis. 1. New nodular density 12 x 7 mm ties are seen throughout the pelvis. The largest measures 12 x 10 mm best seen on image number 105 of series. A 9 mm nodule is seen in the left pelvis best seen on image number 60 of series 5. These are concerning for possible peritoneal implants. 2. Stable 1.9 cm cystic lesion seen in splenic hilum.  Vaginal biopsy performed on 01/04/2016 negative  She was started on PLD and carboplatin   05/11/2016 CT scan abdomen and pelvis IMPRESSION: 1. No acute findings identified within the abdomen or pelvis. 2. Peritoneal nodules within the right side of pelvis are stable the slightly increased in size from previous exam. No new areas of disease identified.  She has completed 6 cycles of  PLD and carboplatin therapy and imaging revealed progressive disease.   08/15/2016 CT scan abdomen and pelvis 1. Study demonstrates slight interval growth of several peritoneal implants in the low anatomic pelvis indicative of slight progression of disease. No other new peritoneal deposits are noted, and there is no other metastatic disease noted elsewhere in the chest, abdomen or pelvis. 2. Colonic diverticulosis without evidence of acute diverticulitis at this time. 3. Aortic  atherosclerosis, in addition to left main coronary artery disease. Assessment for potential risk factor modification, dietary therapy or pharmacologic therapy may be warranted, if clinically indicated. 4. Additional incidental findings, as above.  Rucaparib denied by insurance.   09/24/2016 She was started on paclitaxel and bevacizumab and received 6 cycles of paclitaxel and bevacizumab 03/04/2017 with evidence of response  CT Abdomen and Pelvis 03/28/2017 IMPRESSION: 1. Status post hysterectomy, without recurrent or metastatic disease. 2.  Possible constipation. 3.  Tiny hiatal hernia. 4. Hepatic hemangiomas. 5. Cystic lesion within the right-side of the vagina is similar over prior exams and may represent a Bartholin's gland cyst.  She had received 6 cycles of paclitaxel and bevacizumab 03/04/2017 followed by maintenance bevacizumab since August 2018.   MRI pelvis on 07/09/2017 No evidence of recurrent or metastatic carcinoma within the pelvis. Stable 1.5 cm right vaginal wall cyst, consistent with a benign Gardner's duct or Bartholin's gland cyst.  MRI lumbar spine on 07/09/2017 Negative for metastatic disease. Mild lumbar degenerative change without neural impingement.  CA125 02/20/2016 47.5 03/21/2016 31.1 05/16/2016 37.5 06/13/2016 44.8 06/27/2016 50.4 08/15/2016 64.5 09/24/2016 101.7 10/22/2016 18.2 11/05/2016 13. 0 11/26/2016 12.6 01/21/2017 12.8 02/25/2017 13.3 04/01/2017 14.9 05/13/2017 14.3 06/24/2017 13.9 08/21/2017 19.6  Genetic testing: ATM mutation [id number].  HRD testing negative ([facility])  Dr. Theora Gianotti spoke to Dr. Lisbeth Ply at [facility] and he recommended genetic counseling for the patient and possibly other family members. He provided a phone number for them to call to schedule that appointment. The number is [phone]. Of note her daughter has tested positive for an ATM mutation carrier and she  requested recommendations for who to see at [facility].    Problem List: Patient  Active Problem List   Diagnosis Date Noted  . Chest pain 12/10/2017  . STEMI (ST elevation myocardial infarction) ([place]) 12/10/2017  . Ovarian cancer, unspecified laterality ([place]) 09/11/2017  . Counseling regarding goals of care 11/26/2016  . Encounter for monitoring cardiotoxic drug therapy 02/02/2016  . Breast cancer in female [facility]) 12/13/2015  . Peptic ulcer disease 12/12/2015  . DDD (degenerative disc disease), lumbosacral 12/12/2015  . Hyperlipidemia 12/12/2015  . Acute anxiety 12/12/2015  . Insomnia 12/12/2015  . Allergic rhinitis 12/12/2015  . GERD (gastroesophageal reflux disease) 12/12/2015  . Internal hemorrhoid 12/12/2015  . OA (osteoarthritis) 12/12/2015  . Osteopenia 12/12/2015  . Hypothyroid 04/11/2015  . Benign essential HTN 02/01/2015  . Retroperitoneal fibrosis   . Combined fat and carbohydrate induced hyperlipemia 01/04/2015  . Awareness of heartbeats 01/04/2015  . Beat, premature ventricular 01/04/2015  . Malignant neoplasm of ovary ([place]) 12/16/2014  . MI (mitral incompetence) 09/02/2014  . AF (paroxysmal atrial fibrillation) ([place]) 09/02/2014    Past Medical History: Past Medical History:  Diagnosis Date  . Atrial fibrillation ([place])   . Breast cancer ([place]) 1998  . Cancer of breast ([place]) 1998   Left/radiation  . CINV (chemotherapy-induced nausea and vomiting)   . GERD (gastroesophageal reflux disease)   . Hyperlipidemia   . Hypothyroidism   . Mitral insufficiency   . Ovarian cancer ([place])    s/p BSO optimal tumor debulking May 2016/chemo  . Personal history of chemotherapy since 2016   ovarian cancer  . Personal history of radiation therapy 1998  . PVC (premature ventricular contraction)     Past Surgical History: Past Surgical History:  Procedure Laterality Date  . ABDOMINAL HYSTERECTOMY    . BILATERAL SALPINGOOPHORECTOMY  May 2016   with optimal tumor debulking   . BREAST LUMPECTOMY Left 1998  . BREAST SURGERY     Left  . CESAREAN SECTION      x2  . CHOLECYSTECTOMY    . COLONOSCOPY  2006  . DEBULKING N/A 12/22/2014   Procedure: DEBULKING;  Surgeon: Gillis Ends, MD;  Location: [facility] ORS;  Service: Gynecology;  Laterality: N/A;  . LAPAROTOMY N/A 12/22/2014   Procedure: EXPLORATORY LAPAROTOMY;  Surgeon: Robert Bellow, MD;  Location: [facility] ORS;  Service: General;  Laterality: N/A;  . LAPAROTOMY WITH STAGING N/A 12/22/2014   Procedure: LAPAROTOMY WITH STAGING;  Surgeon: Gillis Ends, MD;  Location: [facility] ORS;  Service: Gynecology;  Laterality: N/A;  . LEFT HEART CATH AND CORONARY ANGIOGRAPHY N/A 12/10/2017   Procedure: LEFT HEART CATH AND CORONARY ANGIOGRAPHY;  Surgeon: Yolonda Kida, MD;  Location: [facility];  Service: Cardiovascular;  Laterality: N/A;  . PERIPHERAL VASCULAR CATHETERIZATION Left 12/22/2014   Procedure: PORTA CATH INSERTION;  Surgeon: Robert Bellow, MD;  Location: [facility] ORS;  Service: General;  Laterality: Left;  . PORTACATH PLACEMENT Right 12/22/2014   Procedure: INSERTION PORT-A-CATH;  Surgeon: Robert Bellow, MD;  Location: [facility] ORS;  Service: General;  Laterality: Right;  . SALPINGOOPHORECTOMY Bilateral 12/22/2014   Procedure: SALPINGO OOPHORECTOMY;  Surgeon: Gillis Ends, MD;  Location: [facility] ORS;  Service: Gynecology;  Laterality: Bilateral;  . UPPER GI ENDOSCOPY  09/25/04   hiatus hernia, schatzki ring and a single gastric polyp    Past Gynecologic History:  See HPI  OB History:  OB History  Gravida Para Term Preterm AB Living  2  2  SAB TAB Ectopic Multiple Live Births               # Outcome Date GA Lbr Len/2nd Weight Sex Delivery Anes PTL Lv  2 Gravida           1 Gravida             Obstetric Comments  1st Menstrual Cycle:  14  1st Pregnancy:  9    Family History: Family History  Problem Relation Age of Onset  . Cancer Mother        breast, throat, and stomach  . Breast cancer Mother   . Heart disease Father   . Pulmonary embolism Sister    . Cancer Brother 60       angiosarcoma of the chest; carcinoid small intestinal tumor  . Hypertension Brother   . Cancer Maternal Aunt        breast cancer  . Cancer Maternal Grandfather 53       pancreatic    Social History: Social History   Socioeconomic History  . Marital status: Married    Spouse name: Not on file  . Number of children: Not on file  . Years of education: Not on file  . Highest education level: Not on file  Occupational History  . Not on file  Social Needs  . Financial resource strain: Not on file  . Food insecurity:    Worry: Not on file    Inability: Not on file  . Transportation needs:    Medical: Not on file    Non-medical: Not on file  Tobacco Use  . Smoking status: Never Smoker  . Smokeless tobacco: Never Used  Substance and Sexual Activity  . Alcohol use: No  . Drug use: No  . Sexual activity: Not Currently  Lifestyle  . Physical activity:    Days per week: Not on file    Minutes per session: Not on file  . Stress: Not on file  Relationships  . Social connections:    Talks on phone: Not on file    Gets together: Not on file    Attends religious service: Not on file    Active member of club or organization: Not on file    Attends meetings of clubs or organizations: Not on file    Relationship status: Not on file  . Intimate partner violence:    Fear of current or ex partner: Not on file    Emotionally abused: Not on file    Physically abused: Not on file    Forced sexual activity: Not on file  Other Topics Concern  . Not on file  Social History Narrative  . Not on file    Allergies: Allergies  Allergen Reactions  . Carafate [Sucralfate] Rash  . Sulfa Antibiotics Rash    Current Medications: Current Outpatient Medications  Medication Sig Dispense Refill  . aspirin EC 325 MG tablet Take 1 tablet (325 mg total) by mouth daily. 100 tablet 3  . Cholecalciferol (VITAMIN D) 2000 units tablet Take 1 tablet (2,000 Units total)  by mouth daily. 30 tablet 12  . furosemide (LASIX) 20 MG tablet Take 0.5 tablets (10 mg total) by mouth daily. 30 tablet 0  . levothyroxine (SYNTHROID, LEVOTHROID) 50 MCG tablet TAKE 1 TABLET EVERY DAY ON AN EMPTY STOMACH WITH A GLASS OF WATER AT LEAST 30-60 MINUTES BEFORE BREAKFAST 30 tablet 12  . LORazepam (ATIVAN) 0.5 MG tablet Take 0.5 tablets (0.25 mg total) by  mouth at bedtime. 30 tablet 4  . metoprolol succinate (TOPROL-XL) 25 MG 24 hr tablet Take 1 tablet by mouth daily.    . Multiple Vitamin (MULTIVITAMIN) capsule Take 1 capsule by mouth daily.    . pantoprazole (PROTONIX) 40 MG tablet Take 1 tablet (40 mg total) by mouth daily. 30 tablet 1  . traMADol (ULTRAM) 50 MG tablet Take 1 tablet (50 mg total) by mouth every 6 (six) hours as needed. 30 tablet 0  . acetaminophen (TYLENOL) 500 MG tablet Take 500 mg by mouth every 6 (six) hours as needed.    Marland Kitchen alum & mag hydroxide-simeth (MAALOX/MYLANTA) 200-200-20 MG/5ML suspension Take 15 mLs by mouth every 6 (six) hours as needed for indigestion or heartburn. (Patient not taking: Reported on 01/15/2018) 355 mL 0  . Docosanol (ABREVA EX) Apply 1 application topically as needed. Fever blisters    . lidocaine-prilocaine (EMLA) cream Apply to port area 30-45 mins prior to chemo. (Patient not taking: Reported on 01/15/2018) 30 g 5  . Lysine 500 MG TABS Take 1 tablet by mouth daily as needed.     . magic mouthwash w/lidocaine SOLN Take 5 mLs by mouth every 4 (four) hours as needed.  1  . olaparib (LYNPARZA) 150 MG tablet Take 2 tablets (300 mg total) by mouth 2 (two) times daily. (Patient not taking: Reported on 01/10/2018) 120 tablet 3  . ondansetron (ZOFRAN) 4 MG tablet One pill 30-60 mins prior to taking zejula to prevent nausea/vomitting. (Patient not taking: Reported on 01/15/2018) 45 tablet 3   No current facility-administered medications for this visit.     Review of Systems General: fatigue and weakness  HEENT: no complaints  Lungs: DOE   Cardiac: None currently- per HPI  GI: chronic constipation; rectal pain- per HPI  GU: negative  Musculoskeletal: no complaints  Extremities: no complaints  Skin: no complaints  Neuro: peripheral neuropathy, stable  Endocrine: no complaints  Psych: no complaints     Objective:  Physical Examination:  BP 129/82 (BP Location: Left Arm, Patient Position: Sitting)   Pulse 87   Temp 97.6 F (36.4 C) (Tympanic)   Resp 18   Ht '5\' 4"'$  (1.626 m)   Wt 188 lb 12.8 oz (85.6 kg)   SpO2 99%   BMI 32.41 kg/m . Weight is stable.    ECOG Performance Status: 1 - Symptomatic but completely ambulatory  GENERAL: Patient is a well appearing female in no acute distress HEENT:  Sclerae anicteric. Oropharynx clear and moist. No ulcerations or evidence of oropharyngeal candidiasis. Neck is supple.  NODES:  No cervical, supraclavicular, or axillary lymphadenopathy palpated.  LUNGS:  Clear to auscultation bilaterally.  No wheezes or rhonchi. HEART:  Regular rate and rhythm.  ABDOMEN:  Rounded, soft, nontender. Positive, normoactive bowel sounds. No organomegaly palpated. MSK:  No focal spinal tenderness to palpation. Full range of motion bilaterally in the upper extremities. EXTREMITIES:  No peripheral edema.   SKIN:  Clear with no obvious rashes or skin changes. No nail dyscrasia. NEURO:  Nonfocal. Well oriented.  Appropriate affect.  Pelvic: Exam Chaperoned by RN Vulva: normal appearing vulva without masses, tenderness or lesions.  Vagina: atrophy, no discharge.  BME: thickening of vaginal cuff. Left paravaginal cyst similar to previous assessments Uterus, Cervix, Adnexa: surgically absent Rectal: confirmatory. No hemorrhoids observed or palpated. Nontender.   Lab Review CA125 as noted above  Radiologic Imaging: n/a    Assessment:  Jamie Conner is a 77 y.o. female diagnosed with optimally debulked  stage IIIc high-grade serous ovarian cancer, initially diagnosed 12/22/2014 s/p chemotherapy  with complete response. Recurrent ovarian cancer diagnosed 01/10/2016.  ATM mutation positive. HRD negative. Extensive family history of cancer (breast, pancreatic, throat, gastric, adenosarcoma of the chest, carcinoid tumor of the small bowel) in several first degree relatives.  Vaginal lesion, granulation tissue.  Recurrent platinum-sensitive ovarian cancer with stable disease on PLD/carboplatin based on RECIST criteria with ultimate progression. Platinum-resistant disease initiated on paclitaxel and bevacizumab with evidence of complete clinical response based on exam, CT scan, and CA125. Received maintenance bevacizumab 04/01/2017-10/07/17. CT C/A/P on 10/25/17 showed progression with cecal implants. MRI brain for headaches negative for metastatic disease. PET showed multiple hypermetabolic foci of tumor along bowel and pelvic mesentery, vaginal cuff. Started Zejula but discontinued due to Takutsubo cardiomyopathy. Per Dr. Rogue Bussing, considering [name]. Today, thickening of vaginal cuff. CA 125- 90.4 (rising). No evidence of hemorrhoids on exam.    Plan:   Problem List Items Addressed This Visit      Genitourinary   Ovarian cancer, unspecified laterality ([place]) - Primary     No evidence of hemorrhoids on exam but symptoms concerning. Discussed with Dr. Theora Gianotti and Dr. Rogue Bussing who recommend GI referral for colonoscopy to assess for possible malignant large bowel obstruction.   Advised patient to discuss Lynparza further with Dr. Rogue Bussing. Discussed rationale for parp inhibitor but also at Dr. Gershon Crane advisement, discussed alternatives including gecitabine, topotecan, combination cyclophosphamide and bevacizumab, as well as clinical trials. Radiation therapy is also an option for disease control in the pelvis.   Continue treatment discussions with Dr. Rogue Bussing.  We will continue to follow closely with repeat exam in 3 months.   Beckey Rutter, DNP, AGNP-C [facility] (work cell) [phone] (office)  CC:  Dr. Rogue Bussing  Medical screening examination/treatment/procedure(s) were performed by non-physician practitioner and as supervising physician I was immediately available for consultation/collaboration. Santiago Glad, MD

## 2018-01-15 NOTE — Progress Notes (Signed)
Chaperoned pelvic exam. Oncology Nurse Navigator Documentation  Navigator Location: [facility] (01/15/18 1000)   )Navigator Encounter Type: Follow-up Appt (01/15/18 1000)                     Patient Visit Type: GynOnc (01/15/18 1000)                              Time Spent with Patient: 15 (01/15/18 1000)

## 2018-01-15 NOTE — Patient Instructions (Addendum)
Olaparib oral capsules What is this medicine? OLAPARIB (oh LA pa rib) is a chemotherapy drug. It targets specific enzymes within cancer cells and stops the cancer cell from growing. This medicine is used to treat ovarian cancer. This medicine may be used for other purposes; ask your health care provider or pharmacist if you have questions. COMMON BRAND NAME(S): Lynparza What should I tell my health care provider before I take this medicine? They need to know if you have any of these conditions: -anemia -kidney disease -liver disease -lung disease -low blood counts, like low white cell, platelet, or red cell counts -an unusual or allergic reaction to olaparib, other medicines, foods, dyes, or preservatives -pregnant or trying to get pregnant -breast-feeding How should I use this medicine? Take this medicine by mouth with a glass of water. Follow the directions on the prescription label. Do not cut, crush, or chew this medicine. You can take it with or without food. If it upsets your stomach, take it with food. However, avoid grapefruit juice, grapefruit or [place] oranges while on this medicine. Take your medicine at regular intervals. Do not take it more often than directed. Do not stop taking except on your doctor's advice. A special MedGuide will be given to you by the pharmacist with each prescription and refill. Be sure to read this information carefully each time. Talk to your pediatrician regarding the use of this medicine in children. Special care may be needed. Overdosage: If you think you have taken too much of this medicine contact a poison control center or emergency room at once. NOTE: This medicine is only for you. Do not share this medicine with others. What if I miss a dose? If you miss a dose, take it as soon as you can. If it is almost time for your next dose, take only that dose. Do not take double or extra doses. What may interact with this medicine? -antiviral medicines  for hepatitis, HIV or AIDS -aprepitant -boceprevir -bosentan -carbamazepine -certain medicines for fungal infections like fluconazole, ketoconazole, itraconazole, posaconazole, and voriconazole -certain medicines for infections, such as ciprofloxacin, clarithromycin, erythromycin, telithromycin -crizotinib -diltiazem -grapefruit juice -imatinib -modafinil -nafcillin -nefazodone -phenobarbital -phenytoin -rifampin -[place] oranges -St. John's Wort -telaprevir -verapamil This list may not describe all possible interactions. Give your health care provider a list of all the medicines, herbs, non-prescription drugs, or dietary supplements you use. Also tell them if you smoke, drink alcohol, or use illegal drugs. Some items may interact with your medicine. What should I watch for while using this medicine? This drug may make you feel generally unwell. This is not uncommon, as chemotherapy can affect healthy cells as well as cancer cells. Report any side effects. Continue your course of treatment even though you feel ill unless your doctor tells you to stop. You will need blood work done while you are taking this medicine. This medicine may increase your risk to bruise or bleed. Call your doctor or health care professional if you notice any unusual bleeding. Call your doctor or health care professional for advice if you get a fever, chills or sore throat, or other symptoms of a cold or flu. Do not treat yourself. This drug decreases your body's ability to fight infections. Try to avoid being around people who are sick. If you are going to have surgery or any other procedures, tell your doctor you are taking this medicine. Do not become pregnant while taking this medicine or for 6 months after the last dose. Women should   inform their doctor if they wish to become pregnant or think they might be pregnant. Men should not father a child while taking this medicine and for 3 months after stopping it.  Do not donate sperm while taking this medicine and for 3 months after you stop taking this medicine. There is a potential for serious side effects to an unborn child. Talk to your health care professional or pharmacist for more information. Women who are able to become pregnant should use effective birth control during treatment and for at least 6 months after receiving the last dose. Talk to your healthcare provider about birth control methods that may be right for you. Do not breast-feed an infant while taking this medicine or for 1 month after the last dose. What side effects may I notice from receiving this medicine? Side effects that you should report to your doctor or health care professional as soon as possible: -allergic reactions like skin rash, itching or hives, swelling of the face, lips, or tongue -breathing problems, like shortness of breath, cough, or wheezing -fever -low blood counts - this medicine may decrease the number of white blood cells, red blood cells and platelets. You may be at increased risk for infections and bleeding. -signs and symptoms of bleeding such as bloody or black, tarry stools; red or dark-brown urine; spitting up blood or brown material that looks like coffee grounds; red spots on the skin; unusual bruising or bleeding from the eye, gums, or nose -signs and symptoms of a blood clot such as changes in vision; chest pain with breathing problems; severe, sudden headache; pain, swelling, warmth in the leg; trouble speaking; sudden numbness or weakness of the face, arm or leg -signs and symptoms of low magnesium like muscle cramps or muscle pain; tingling or tremors; muscle weakness; seizures; or fast, irregular heartbeat -signs and symptoms of infection like fever or chills; cough; sore throat; pain or trouble passing urine -weak or tired Side effects that usually do not require medical attention (report to your doctor or health care professional if they continue or  are bothersome): -changes in taste -diarrhea -headache -heartburn, indigestion -loss of appetite -muscle or joint pain -nausea/vomiting -runny nose -stomach pain -weight loss This list may not describe all possible side effects. Call your doctor for medical advice about side effects. You may report side effects to FDA at 1-800-FDA-1088. Where should I keep my medicine? Keep out of the reach of children. Store between 20 and 25 degrees C (68 and 77 degrees F). Do not store at temperatures greater than 40 degrees C (104 degrees F) and do not take this medicine if you think it may have been stored at a temperature greater than 104 degrees F. Throw away any unused medicine after the expiration date. NOTE: This sheet is a summary. It may not cover all possible information. If you have questions about this medicine, talk to your doctor, pharmacist, or health care provider.  2018 Elsevier/Gold Standard (2016-09-04 12:45:04)  

## 2018-01-16 ENCOUNTER — Telehealth: Payer: Self-pay | Admitting: Pharmacist

## 2018-01-16 DIAGNOSIS — I10 Essential (primary) hypertension: Secondary | ICD-10-CM | POA: Diagnosis not present

## 2018-01-16 DIAGNOSIS — I480 Paroxysmal atrial fibrillation: Secondary | ICD-10-CM | POA: Diagnosis not present

## 2018-01-16 DIAGNOSIS — I340 Nonrheumatic mitral (valve) insufficiency: Secondary | ICD-10-CM | POA: Diagnosis not present

## 2018-01-16 DIAGNOSIS — I214 Non-ST elevation (NSTEMI) myocardial infarction: Secondary | ICD-10-CM | POA: Diagnosis not present

## 2018-01-16 MED FILL — LYNPARZA 150 MG TABLET: 150 | 30 days supply | Qty: 120 | Fill #0

## 2018-01-16 NOTE — Telephone Encounter (Signed)
Oral Chemotherapy Pharmacist Encounter   Per Dr. Jacinto Reap, based on Ms. Pippins's cardiac evaluation, she is okay to proceed with Lynparza (olaparib) therapy.  Spoke with Ms. Baine and informed her of this. Medication will be delivered to her 01/20/18.   Reviewed dosing, administration, and side effects. She stated her understanding.    Darl Pikes, PharmD, BCPS Hematology/Oncology Clinical Pharmacist [facility] [phone]  01/16/2018 3:59 PM

## 2018-01-27 DIAGNOSIS — K6289 Other specified diseases of anus and rectum: Secondary | ICD-10-CM | POA: Diagnosis not present

## 2018-01-30 ENCOUNTER — Inpatient Hospital Stay (HOSPITAL_BASED_OUTPATIENT_CLINIC_OR_DEPARTMENT_OTHER): Payer: PPO | Admitting: Oncology

## 2018-01-30 ENCOUNTER — Encounter: Payer: Self-pay | Admitting: Oncology

## 2018-01-30 ENCOUNTER — Other Ambulatory Visit: Payer: Self-pay

## 2018-01-30 ENCOUNTER — Inpatient Hospital Stay: Payer: PPO | Attending: Oncology

## 2018-01-30 VITALS — BP 131/79 | HR 66 | Temp 96.4°F | Resp 18 | Wt 189.9 lb

## 2018-01-30 DIAGNOSIS — I252 Old myocardial infarction: Secondary | ICD-10-CM | POA: Diagnosis not present

## 2018-01-30 DIAGNOSIS — Z90722 Acquired absence of ovaries, bilateral: Secondary | ICD-10-CM | POA: Insufficient documentation

## 2018-01-30 DIAGNOSIS — N183 Chronic kidney disease, stage 3 unspecified: Secondary | ICD-10-CM

## 2018-01-30 DIAGNOSIS — Z923 Personal history of irradiation: Secondary | ICD-10-CM | POA: Diagnosis not present

## 2018-01-30 DIAGNOSIS — I129 Hypertensive chronic kidney disease with stage 1 through stage 4 chronic kidney disease, or unspecified chronic kidney disease: Secondary | ICD-10-CM

## 2018-01-30 DIAGNOSIS — I700 Atherosclerosis of aorta: Secondary | ICD-10-CM | POA: Diagnosis not present

## 2018-01-30 DIAGNOSIS — Z853 Personal history of malignant neoplasm of breast: Secondary | ICD-10-CM | POA: Diagnosis not present

## 2018-01-30 DIAGNOSIS — R5383 Other fatigue: Secondary | ICD-10-CM | POA: Diagnosis not present

## 2018-01-30 DIAGNOSIS — C569 Malignant neoplasm of unspecified ovary: Secondary | ICD-10-CM

## 2018-01-30 DIAGNOSIS — Z9221 Personal history of antineoplastic chemotherapy: Secondary | ICD-10-CM

## 2018-01-30 DIAGNOSIS — G629 Polyneuropathy, unspecified: Secondary | ICD-10-CM | POA: Diagnosis not present

## 2018-01-30 DIAGNOSIS — I429 Cardiomyopathy, unspecified: Secondary | ICD-10-CM | POA: Insufficient documentation

## 2018-01-30 DIAGNOSIS — Z7982 Long term (current) use of aspirin: Secondary | ICD-10-CM

## 2018-01-30 DIAGNOSIS — E785 Hyperlipidemia, unspecified: Secondary | ICD-10-CM | POA: Insufficient documentation

## 2018-01-30 DIAGNOSIS — K219 Gastro-esophageal reflux disease without esophagitis: Secondary | ICD-10-CM | POA: Insufficient documentation

## 2018-01-30 DIAGNOSIS — I481 Persistent atrial fibrillation: Secondary | ICD-10-CM

## 2018-01-30 DIAGNOSIS — Z90710 Acquired absence of both cervix and uterus: Secondary | ICD-10-CM | POA: Diagnosis not present

## 2018-01-30 DIAGNOSIS — R5381 Other malaise: Secondary | ICD-10-CM | POA: Diagnosis not present

## 2018-01-30 DIAGNOSIS — C786 Secondary malignant neoplasm of retroperitoneum and peritoneum: Secondary | ICD-10-CM | POA: Insufficient documentation

## 2018-01-30 DIAGNOSIS — Z79899 Other long term (current) drug therapy: Secondary | ICD-10-CM | POA: Diagnosis not present

## 2018-01-30 DIAGNOSIS — E039 Hypothyroidism, unspecified: Secondary | ICD-10-CM | POA: Insufficient documentation

## 2018-01-30 DIAGNOSIS — Z9223 Personal history of estrogen therapy: Secondary | ICD-10-CM

## 2018-01-30 DIAGNOSIS — R971 Elevated cancer antigen 125 [CA 125]: Secondary | ICD-10-CM | POA: Insufficient documentation

## 2018-01-30 DIAGNOSIS — Z800 Family history of malignant neoplasm of digestive organs: Secondary | ICD-10-CM | POA: Insufficient documentation

## 2018-01-30 DIAGNOSIS — N179 Acute kidney failure, unspecified: Secondary | ICD-10-CM | POA: Diagnosis not present

## 2018-01-30 DIAGNOSIS — Z803 Family history of malignant neoplasm of breast: Secondary | ICD-10-CM

## 2018-01-30 DIAGNOSIS — K1230 Oral mucositis (ulcerative), unspecified: Secondary | ICD-10-CM | POA: Insufficient documentation

## 2018-01-30 LAB — CBC WITH DIFFERENTIAL/PLATELET
Basophils Absolute: 0 10*3/uL (ref 0–0.1)
Basophils Relative: 1 %
EOS ABS: 0.2 10*3/uL (ref 0–0.7)
Eosinophils Relative: 3 %
HEMATOCRIT: 42.4 % (ref 35.0–47.0)
HEMOGLOBIN: 14.3 g/dL (ref 12.0–16.0)
LYMPHS ABS: 3.5 10*3/uL (ref 1.0–3.6)
LYMPHS PCT: 49 %
MCH: 32.4 pg (ref 26.0–34.0)
MCHC: 33.7 g/dL (ref 32.0–36.0)
MCV: 96.2 fL (ref 80.0–100.0)
MONOS PCT: 9 %
Monocytes Absolute: 0.7 10*3/uL (ref 0.2–0.9)
NEUTROS ABS: 2.8 10*3/uL (ref 1.4–6.5)
NEUTROS PCT: 38 %
Platelets: 250 10*3/uL (ref 150–440)
RBC: 4.41 MIL/uL (ref 3.80–5.20)
RDW: 14.4 % (ref 11.5–14.5)
WBC: 7.2 10*3/uL (ref 3.6–11.0)

## 2018-01-30 LAB — COMPREHENSIVE METABOLIC PANEL
ALT: 15 U/L (ref 14–54)
AST: 24 U/L (ref 15–41)
Albumin: 3.7 g/dL (ref 3.5–5.0)
Alkaline Phosphatase: 75 U/L (ref 38–126)
Anion gap: 10 (ref 5–15)
BUN: 16 mg/dL (ref 6–20)
CHLORIDE: 106 mmol/L (ref 101–111)
CO2: 24 mmol/L (ref 22–32)
CREATININE: 1.21 mg/dL — AB (ref 0.44–1.00)
Calcium: 9.5 mg/dL (ref 8.9–10.3)
GFR calc Af Amer: 50 mL/min — ABNORMAL LOW (ref >60)
GFR, EST NON AFRICAN AMERICAN: 43 mL/min — AB (ref >60)
GLUCOSE: 108 mg/dL — AB (ref 65–99)
Potassium: 4.2 mmol/L (ref 3.5–5.1)
Sodium: 140 mmol/L (ref 135–145)
Total Bilirubin: 0.6 mg/dL (ref 0.3–1.2)
Total Protein: 6.9 g/dL (ref 6.5–8.1)

## 2018-01-30 LAB — URINALYSIS, COMPLETE (UACMP) WITH MICROSCOPIC
BACTERIA UA: NONE SEEN
BILIRUBIN URINE: NEGATIVE
Glucose, UA: NEGATIVE mg/dL
Hgb urine dipstick: NEGATIVE
Ketones, ur: NEGATIVE mg/dL
Leukocytes, UA: NEGATIVE
Nitrite: NEGATIVE
Protein, ur: NEGATIVE mg/dL
SPECIFIC GRAVITY, URINE: 1.005 (ref 1.005–1.030)
pH: 5 (ref 5.0–8.0)

## 2018-01-30 NOTE — Progress Notes (Signed)
Patient here today for follow.  °

## 2018-01-30 NOTE — Progress Notes (Signed)
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 Cont chemo [finished DEC 2017]. DEC 28th 2017 CT- Increase in peritoneal deposits; increasing Ca 125 [60s];  # Jan 2018- carbo-Taxol-Avastin x6 cycles; Aug 2018- CR; Aug 2018- start Avastin Maintenance; MARCH 2019- PROGRESSION- STOP avastin.   # MID April 2019- Zejula [300 mg/day]; 4 days- later STOPPED sec to Chest pain   # May 2019- TAKASUBO ? [s/p cardiac cath- Dr.Kowalski]  # G-1-2 hand foot syndrome-   # LEFT BREAST CA s/p Lumpec & RT s/p TAM   # Afib [cardiology]; JUNE 2017- MUGA 62%  Jan 2018- MOLECULAR TESTING- ATM DELETERIOUS HETEROZYGOUS mutation; TUMOR BRCA- NEG; Myriad genomic instability- NEGATIVE;   # NGS/Omniseq- BRCA-2 copy loss/ * [april2019]; NEG- BRCA Mutations/N-TRK/MSI-STABLE.  ---------------------------------------------------------------------    DIAGNOSIS: MAY 2017; RECURRENT OVARIAN CA-high-grade serous/platinum refractory  STAGE: IV        ;GOALS: PALLIATIVE  CURRENT/MOST RECENT THERAPY: Zejula discontinued; awaiting to start Lynparza       Malignant neoplasm of ovary ([place])    Ovarian cancer, unspecified laterality ([place])      INTERVAL HISTORY:  Jamie Conner 77 y.o.  female pleasant patient above history of presents for assessment of toxicities and tolerability for treatment of ovarian cancer. Patient reports that she was started on Lynparza 150 mg twice daily on January 21 2018.  She tolerates well and increased her dose to 300 mg twice daily since 2 days ago. She has mild swelling of the legs, take Lasix as needed.  No recent Lasix.  Denies any nausea vomiting. Fatigue: reports worsening fatigue since increased her dose to 300 mg twice daily.. Chronic onset, perisistent, no aggravating or improving factors, no associated symptoms.   Blood pressure has been good control.  Review of Systems  Constitutional: Positive for malaise/fatigue. Negative for chills, diaphoresis, fever and weight loss.  HENT: Negative for congestion, ear discharge, ear pain, nosebleeds, sinus pain and sore throat.   Eyes: Negative for double vision, photophobia, pain, discharge and redness.  Respiratory: Negative for cough, hemoptysis, sputum production, shortness of breath and wheezing.   Cardiovascular: Positive for leg swelling. Negative for chest pain, palpitations, orthopnea and claudication.  Gastrointestinal: Negative for abdominal pain, blood in stool, constipation, diarrhea, heartburn, melena, nausea and vomiting.  Genitourinary: Negative for dysuria, flank pain, frequency, hematuria and urgency.  Musculoskeletal: Negative for back pain, joint pain, myalgias and neck pain.  Skin: Negative.  Negative for itching and rash.  Neurological: Negative for dizziness, tingling, tremors, focal weakness, weakness and headaches.  Endo/Heme/Allergies: Negative for environmental allergies. Does not bruise/bleed easily.  Psychiatric/Behavioral: Negative for depression and hallucinations. The patient is not nervous/anxious and does not have insomnia.  PAST MEDICAL HISTORY :  Past Medical History:  Diagnosis Date  . Atrial fibrillation ([place])   . Breast cancer ([place]) 1998  . Cancer of breast ([place]) 1998   Left/radiation  . CINV (chemotherapy-induced nausea and vomiting)   . GERD (gastroesophageal reflux disease)   . Hyperlipidemia   . Hypothyroidism   . Mitral insufficiency   . Ovarian cancer ([place])    s/p BSO optimal tumor debulking May 2016/chemo  . Personal history of chemotherapy since 2016   ovarian cancer  . Personal history of radiation therapy 1998  . PVC (premature ventricular contraction)     PAST SURGICAL HISTORY :   Past Surgical History:  Procedure Laterality Date  . ABDOMINAL HYSTERECTOMY    . BILATERAL SALPINGOOPHORECTOMY  May 2016   with optimal tumor debulking   . BREAST LUMPECTOMY Left 1998  . BREAST SURGERY     Left  . CESAREAN SECTION     x2  . CHOLECYSTECTOMY    . COLONOSCOPY  2006  . DEBULKING N/A 12/22/2014   Procedure: DEBULKING;  Surgeon: Gillis Ends, MD;  Location: [facility];  Service: Gynecology;  Laterality: N/A;  . LAPAROTOMY N/A 12/22/2014   Procedure: EXPLORATORY LAPAROTOMY;  Surgeon: Robert Bellow, MD;  Location: [facility];  Service: General;  Laterality: N/A;  . LAPAROTOMY WITH STAGING N/A 12/22/2014   Procedure: LAPAROTOMY WITH STAGING;  Surgeon: Gillis Ends, MD;  Location: [facility];  Service: Gynecology;  Laterality: N/A;  . LEFT HEART CATH AND CORONARY ANGIOGRAPHY N/A 12/10/2017   Procedure: LEFT HEART CATH AND CORONARY ANGIOGRAPHY;  Surgeon: Yolonda Kida, MD;  Location: [place] [facility];  Service: Cardiovascular;  Laterality: N/A;  . PERIPHERAL VASCULAR CATHETERIZATION Left 12/22/2014   Procedure: PORTA CATH INSERTION;  Surgeon: Robert Bellow, MD;  Location: [facility];  Service: General;  Laterality: Left;  . PORTACATH PLACEMENT Right 12/22/2014   Procedure: INSERTION PORT-A-CATH;  Surgeon: Robert Bellow, MD;  Location: [facility];  Service: General;   Laterality: Right;  . SALPINGOOPHORECTOMY Bilateral 12/22/2014   Procedure: SALPINGO OOPHORECTOMY;  Surgeon: Gillis Ends, MD;  Location: [facility];  Service: Gynecology;  Laterality: Bilateral;  . UPPER GI ENDOSCOPY  09/25/04   hiatus hernia, schatzki ring and a single gastric polyp    FAMILY HISTORY :   Family History  Problem Relation Age of Onset  . Cancer Mother        breast, throat, and stomach  . Breast cancer Mother   . Heart disease Father   . Pulmonary embolism Sister   . Cancer Brother 60       angiosarcoma of the chest; carcinoid small intestinal tumor  . Hypertension Brother   . Cancer Maternal Aunt        breast cancer  . Cancer Maternal Grandfather 76       pancreatic    SOCIAL HISTORY:   Social History   Tobacco Use  . Smoking status: Never Smoker  . Smokeless tobacco: Never Used  Substance Use Topics  . Alcohol use: No  . Drug use: No    ALLERGIES:  is allergic to carafate [sucralfate] and sulfa antibiotics.  MEDICATIONS:  Current Outpatient Medications  Medication Sig Dispense Refill  . acetaminophen (TYLENOL) 500 MG tablet Take 500 mg by mouth every 6 (six) hours as needed.    Marland Kitchen alum & mag hydroxide-simeth (MAALOX/MYLANTA) 200-200-20 MG/5ML suspension Take 15 mLs by mouth every 6 (six) hours as needed for indigestion or heartburn. (Patient  not taking: Reported on 01/15/2018) 355 mL 0  . aspirin EC 325 MG tablet Take 1 tablet (325 mg total) by mouth daily. 100 tablet 3  . Cholecalciferol (VITAMIN D) 2000 units tablet Take 1 tablet (2,000 Units total) by mouth daily. 30 tablet 12  . Docosanol (ABREVA EX) Apply 1 application topically as needed. Fever blisters    . furosemide (LASIX) 20 MG tablet Take 0.5 tablets (10 mg total) by mouth daily. 30 tablet 0  . levothyroxine (SYNTHROID, LEVOTHROID) 50 MCG tablet TAKE 1 TABLET EVERY DAY ON AN EMPTY STOMACH WITH A GLASS OF WATER AT LEAST 30-60 MINUTES BEFORE BREAKFAST 30 tablet 12  . lidocaine-prilocaine  (EMLA) cream Apply to port area 30-45 mins prior to chemo. (Patient not taking: Reported on 01/15/2018) 30 g 5  . LORazepam (ATIVAN) 0.5 MG tablet Take 0.5 tablets (0.25 mg total) by mouth at bedtime. 30 tablet 4  . Lysine 500 MG TABS Take 1 tablet by mouth daily as needed.     . magic mouthwash w/lidocaine SOLN Take 5 mLs by mouth every 4 (four) hours as needed.  1  . metoprolol succinate (TOPROL-XL) 25 MG 24 hr tablet Take 1 tablet by mouth daily.    . Multiple Vitamin (MULTIVITAMIN) capsule Take 1 capsule by mouth daily.    Marland Kitchen olaparib (LYNPARZA) 150 MG tablet Take 2 tablets (300 mg total) by mouth 2 (two) times daily. (Patient not taking: Reported on 01/10/2018) 120 tablet 3  . ondansetron (ZOFRAN) 4 MG tablet One pill 30-60 mins prior to taking zejula to prevent nausea/vomitting. (Patient not taking: Reported on 01/15/2018) 45 tablet 3  . pantoprazole (PROTONIX) 40 MG tablet Take 1 tablet (40 mg total) by mouth daily. 30 tablet 1  . traMADol (ULTRAM) 50 MG tablet Take 1 tablet (50 mg total) by mouth every 6 (six) hours as needed. 30 tablet 0   No current facility-administered medications for this visit.     PHYSICAL EXAMINATION: ECOG PERFORMANCE STATUS: 0 - Asymptomatic  BP 131/79 (BP Location: Left Arm, Patient Position: Sitting)   Pulse 66   Temp (!) 96.4 F (35.8 C) (Tympanic)   Resp 18   Wt 189 lb 14.4 oz (86.1 kg)   BMI 32.60 kg/m   Filed Weights   01/30/18 1423  Weight: 189 lb 14.4 oz (86.1 kg)  Physical Exam  Constitutional: She is oriented to person, place, and time and well-developed, well-nourished, and in no distress. No distress.  HENT:  Head: Normocephalic and atraumatic.  Nose: Nose normal.  Mouth/Throat: Oropharynx is clear and moist. No oropharyngeal exudate.  Eyes: Pupils are equal, round, and reactive to light. EOM are normal. Left eye exhibits no discharge. No scleral icterus.  Neck: Normal range of motion. Neck supple. No JVD present.  Cardiovascular: Normal  rate, regular rhythm and normal heart sounds.  No murmur heard. Pulmonary/Chest: Effort normal. No respiratory distress. She has no wheezes. She has no rales. She exhibits no tenderness.    Decreased breath sounds bilaterally  Abdominal: Soft. She exhibits no distension and no mass. There is no tenderness. There is no rebound.  Musculoskeletal: Normal range of motion. She exhibits no edema or tenderness.  Lymphadenopathy:    She has no cervical adenopathy.  Neurological: She is alert and oriented to person, place, and time. No cranial nerve deficit. She exhibits normal muscle tone. Coordination normal.  Skin: Skin is warm and dry. No rash noted. She is not diaphoretic. No erythema.  Psychiatric: Affect and judgment normal.  LABORATORY DATA:  I have reviewed the data as listed    Component Value Date/Time   NA 138 01/10/2018 1346   NA 140 12/14/2014 1037   K 4.3 01/10/2018 1346   K 4.4 12/14/2014 1037   CL 107 01/10/2018 1346   CL 106 12/14/2014 1037   CO2 24 01/10/2018 1346   CO2 26 12/14/2014 1037   GLUCOSE 108 (H) 01/10/2018 1346   GLUCOSE 87 12/14/2014 1037   BUN 17 01/10/2018 1346   BUN 12 12/14/2014 1037   CREATININE 1.08 (H) 01/10/2018 1346   CREATININE 0.97 12/14/2014 1037   CALCIUM 9.3 01/10/2018 1346   CALCIUM 9.2 12/14/2014 1037   PROT 6.7 01/10/2018 1346   PROT 7.3 12/14/2014 1037   ALBUMIN 3.5 01/10/2018 1346   ALBUMIN 3.8 12/14/2014 1037   AST 23 01/10/2018 1346   AST 20 12/14/2014 1037   ALT 15 01/10/2018 1346   ALT 11 (L) 12/14/2014 1037   ALKPHOS 67 01/10/2018 1346   ALKPHOS 138 (H) 12/14/2014 1037   BILITOT 0.4 01/10/2018 1346   BILITOT 0.5 12/14/2014 1037   GFRNONAA 49 (L) 01/10/2018 1346   GFRNONAA 59 (L) 12/14/2014 1037   GFRAA 57 (L) 01/10/2018 1346   GFRAA >60 12/14/2014 1037    No results found for: SPEP, UPEP  Lab Results  Component Value Date   WBC 7.9 01/10/2018   NEUTROABS 3.0 01/10/2018   HGB 13.7 01/10/2018   HCT 40.7  01/10/2018   MCV 96.2 01/10/2018   PLT 216 01/10/2018      Chemistry      Component Value Date/Time   NA 138 01/10/2018 1346   NA 140 12/14/2014 1037   K 4.3 01/10/2018 1346   K 4.4 12/14/2014 1037   CL 107 01/10/2018 1346   CL 106 12/14/2014 1037   CO2 24 01/10/2018 1346   CO2 26 12/14/2014 1037   BUN 17 01/10/2018 1346   BUN 12 12/14/2014 1037   CREATININE 1.08 (H) 01/10/2018 1346   CREATININE 0.97 12/14/2014 1037   GLU 84 03/16/2014      Component Value Date/Time   CALCIUM 9.3 01/10/2018 1346   CALCIUM 9.2 12/14/2014 1037   ALKPHOS 67 01/10/2018 1346   ALKPHOS 138 (H) 12/14/2014 1037   AST 23 01/10/2018 1346   AST 20 12/14/2014 1037   ALT 15 01/10/2018 1346   ALT 11 (L) 12/14/2014 1037   BILITOT 0.4 01/10/2018 1346   BILITOT 0.5 12/14/2014 1037      ASSESSMENT & PLAN:  1. Ovarian cancer, unspecified laterality ([place])   2. Stage 3 chronic kidney disease ([place])   3. AKI (acute kidney injury) ([place])    Patient appears to tolerate treatment except creatinine increased to 1.2.  GFR decreased to 43. Advised patient to go back on Lynparza 150 mg twice daily for now.  Encourage oral hydration.  Repeat BMP in 3 days., If GFR persistently low, plan not to increase to 300 twice daily but dose decreased to 200 twice daily.  Patient voices understanding.  Orders Placed This Encounter  Procedures  . Basic metabolic panel    Standing Status:   Future    Standing Expiration Date:   01/31/2019   All questions were answered. The patient knows to call the clinic with any problems, questions or concerns.  Follow-up in 2 weeks with Dr. Marvene Staff, MD 01/30/2018 10:17 AM

## 2018-02-03 ENCOUNTER — Inpatient Hospital Stay: Payer: PPO

## 2018-02-03 DIAGNOSIS — C569 Malignant neoplasm of unspecified ovary: Secondary | ICD-10-CM

## 2018-02-03 LAB — BASIC METABOLIC PANEL
ANION GAP: 6 (ref 5–15)
BUN: 14 mg/dL (ref 6–20)
CALCIUM: 9.6 mg/dL (ref 8.9–10.3)
CO2: 25 mmol/L (ref 22–32)
Chloride: 106 mmol/L (ref 101–111)
Creatinine, Ser: 1.11 mg/dL — ABNORMAL HIGH (ref 0.44–1.00)
GFR calc Af Amer: 55 mL/min — ABNORMAL LOW (ref >60)
GFR calc non Af Amer: 48 mL/min — ABNORMAL LOW (ref >60)
GLUCOSE: 97 mg/dL (ref 65–99)
Potassium: 5.1 mmol/L (ref 3.5–5.1)
Sodium: 137 mmol/L (ref 135–145)

## 2018-02-06 ENCOUNTER — Ambulatory Visit (INDEPENDENT_AMBULATORY_CARE_PROVIDER_SITE_OTHER): Payer: PPO

## 2018-02-06 ENCOUNTER — Ambulatory Visit (INDEPENDENT_AMBULATORY_CARE_PROVIDER_SITE_OTHER): Payer: PPO | Admitting: Family Medicine

## 2018-02-06 VITALS — BP 124/64 | HR 75 | Temp 99.1°F | Ht 64.0 in | Wt 188.8 lb

## 2018-02-06 VITALS — BP 124/64 | HR 75 | Temp 99.1°F | Ht 64.0 in | Wt 188.0 lb

## 2018-02-06 DIAGNOSIS — Z0000 Encounter for general adult medical examination without abnormal findings: Secondary | ICD-10-CM | POA: Diagnosis not present

## 2018-02-06 NOTE — Progress Notes (Signed)
Patient: Jamie Conner, Female    DOB: Sep 16, 1940, 77 y.o.   MRN: [id number] Visit Date: 02/06/2018  Today's Provider: Wilhemena Durie, MD   Chief Complaint  Patient presents with  . Annual Exam   Subjective:  Jamie Conner is a 77 y.o. female who presents today for health maintenance and complete physical. She feels well. She reports exercising none. She reports she is sleeping well.   Review of Systems  Constitutional: Positive for activity change and appetite change.  HENT: Positive for ear pain, postnasal drip and rhinorrhea.   Eyes: Negative.   Respiratory: Negative.   Cardiovascular: Negative.   Gastrointestinal: Negative.   Endocrine: Negative.   Genitourinary: Negative.   Musculoskeletal: Positive for arthralgias and gait problem.  Skin: Negative.   Allergic/Immunologic: Negative.   Hematological: Negative.   Psychiatric/Behavioral: Negative.     Social History   Socioeconomic History  . Marital status: Married    Spouse name: Not on file  . Number of children: 2  . Years of education: Not on file  . Highest education level: Bachelor's degree (e.g., BA, AB, BS)  Occupational History  . Occupation: retired  Scientific laboratory technician  . Financial resource strain: Not hard at all  . Food insecurity:    Worry: Never true    Inability: Never true  . Transportation needs:    Medical: No    Non-medical: No  Tobacco Use  . Smoking status: Never Smoker  . Smokeless tobacco: Never Used  Substance and Sexual Activity  . Alcohol use: No  . Drug use: No  . Sexual activity: Not Currently  Lifestyle  . Physical activity:    Days per week: Not on file    Minutes per session: Not on file  . Stress: To some extent  Relationships  . Social connections:    Talks on phone: Not on file    Gets together: Not on file    Attends religious service: Not on file    Active member of club or organization: Not on file    Attends meetings of clubs or organizations: Not on file     Relationship status: Not on file  . Intimate partner violence:    Fear of current or ex partner: Not on file    Emotionally abused: Not on file    Physically abused: Not on file    Forced sexual activity: Not on file  Other Topics Concern  . Not on file  Social History Narrative  . Not on file    Patient Active Problem List   Diagnosis Date Noted  . Chest pain 12/10/2017  . STEMI (ST elevation myocardial infarction) ([place]) 12/10/2017  . Ovarian cancer, unspecified laterality ([place]) 09/11/2017  . Counseling regarding goals of care 11/26/2016  . Encounter for monitoring cardiotoxic drug therapy 02/02/2016  . Breast cancer in female [facility]) 12/13/2015  . Peptic ulcer disease 12/12/2015  . DDD (degenerative disc disease), lumbosacral 12/12/2015  . Hyperlipidemia 12/12/2015  . Acute anxiety 12/12/2015  . Insomnia 12/12/2015  . Allergic rhinitis 12/12/2015  . GERD (gastroesophageal reflux disease) 12/12/2015  . Internal hemorrhoid 12/12/2015  . OA (osteoarthritis) 12/12/2015  . Osteopenia 12/12/2015  . Hypothyroid 04/11/2015  . Benign essential HTN 02/01/2015  . Retroperitoneal fibrosis   . Combined fat and carbohydrate induced hyperlipemia 01/04/2015  . Awareness of heartbeats 01/04/2015  . Beat, premature ventricular 01/04/2015  . Malignant neoplasm of ovary ([place]) 12/16/2014  . MI (mitral incompetence) 09/02/2014  . AF (paroxysmal atrial fibrillation) (  [place]) 09/02/2014    Past Surgical History:  Procedure Laterality Date  . ABDOMINAL HYSTERECTOMY    . BILATERAL SALPINGOOPHORECTOMY  May 2016   with optimal tumor debulking   . BREAST LUMPECTOMY Left 1998  . BREAST SURGERY     Left  . CESAREAN SECTION     x2  . CHOLECYSTECTOMY    . COLONOSCOPY  2006  . DEBULKING N/A 12/22/2014   Procedure: DEBULKING;  Surgeon: Gillis Ends, MD;  Location: [facility] ORS;  Service: Gynecology;  Laterality: N/A;  . LAPAROTOMY N/A 12/22/2014   Procedure: EXPLORATORY LAPAROTOMY;  Surgeon:  Robert Bellow, MD;  Location: [facility] ORS;  Service: General;  Laterality: N/A;  . LAPAROTOMY WITH STAGING N/A 12/22/2014   Procedure: LAPAROTOMY WITH STAGING;  Surgeon: Gillis Ends, MD;  Location: [facility] OR[facility];  Service: Gynecology;  Laterality: N/A;  . LEFT HEART CATH AND CORONARY ANGIOGRAPHY N/A 12/10/2017   Procedure: LEFT HEART CATH AND CORONARY ANGIOGRAPHY;  Surgeon: Yolonda Kida, MD;  Location: [facility];  Service: Cardiovascular;  Laterality: N/A;  . PERIPHERAL VASCULAR CATHETERIZATION Left 12/22/2014   Procedure: PORTA CATH INSERTION;  Surgeon: Robert Bellow, MD;  Location: [facility];  Service: General;  Laterality: Left;  . PORTACATH PLACEMENT Right 12/22/2014   Procedure: INSERTION PORT-A-CATH;  Surgeon: Robert Bellow, MD;  Location: [facility];  Service: General;  Laterality: Right;  . SALPINGOOPHORECTOMY Bilateral 12/22/2014   Procedure: SALPINGO OOPHORECTOMY;  Surgeon: Gillis Ends, MD;  Location: [facility] OR[facility];  Service: Gynecology;  Laterality: Bilateral;  . UPPER GI ENDOSCOPY  09/25/04   hiatus hernia, schatzki ring and a single gastric polyp    Her family history includes Breast cancer in her mother; Cancer in her maternal aunt and mother; Cancer (age of onset: 28) in her brother; Cancer (age of onset: 47) in her maternal grandfather; Heart disease in her father; Hypertension in her brother; Pulmonary embolism in her sister; Stroke in her brother.     Outpatient Encounter Medications as of 02/06/2018  Medication Sig Note  . acetaminophen (TYLENOL) 500 MG tablet Take 500 mg by mouth every 6 (six) hours as needed.   Marland Kitchen alum & mag hydroxide-simeth (MAALOX/MYLANTA) 200-200-20 MG/5ML suspension Take 15 mLs by mouth every 6 (six) hours as needed for indigestion or heartburn.   Marland Kitchen aspirin EC 325 MG tablet Take 1 tablet (325 mg total) by mouth daily.   . Cholecalciferol (VITAMIN D) 2000 units tablet Take 1 tablet (2,000 Units total) by mouth daily.   . Docosanol  (ABREVA EX) Apply 1 application topically as needed. Fever blisters   . furosemide (LASIX) 20 MG tablet Take 0.5 tablets (10 mg total) by mouth daily. 01/30/2018: Taking as needed  . levothyroxine (SYNTHROID, LEVOTHROID) 50 MCG tablet TAKE 1 TABLET EVERY DAY ON AN EMPTY STOMACH WITH A GLASS OF WATER AT LEAST 30-60 MINUTES BEFORE BREAKFAST   . lidocaine-prilocaine (EMLA) cream Apply to port area 30-45 mins prior to chemo.   Marland Kitchen LORazepam (ATIVAN) 0.5 MG tablet Take 0.5 tablets (0.25 mg total) by mouth at bedtime.   Marland Kitchen Lysine 500 MG TABS Take 1 tablet by mouth daily as needed.    . magic mouthwash w/lidocaine SOLN Take 5 mLs by mouth every 4 (four) hours as needed.   . metoprolol succinate (TOPROL-XL) 25 MG 24 hr tablet Take 1 tablet by mouth daily.   . Multiple Vitamin (MULTIVITAMIN) capsule Take 1 capsule by mouth daily.   Marland Kitchen olaparib (LYNPARZA) 150 MG tablet  Take 2 tablets (300 mg total) by mouth 2 (two) times daily. (Patient taking differently: Take 150 mg by mouth 2 (two) times daily. )   . ondansetron (ZOFRAN) 4 MG tablet One pill 30-60 mins prior to taking zejula to prevent nausea/vomitting.   . pantoprazole (PROTONIX) 40 MG tablet Take 1 tablet (40 mg total) by mouth daily.   . traMADol (ULTRAM) 50 MG tablet Take 1 tablet (50 mg total) by mouth every 6 (six) hours as needed.    No facility-administered encounter medications on file as of 02/06/2018.     Patient Care Team: Jerrol Banana., MD as PCP - General (Family Medicine) Gillis Ends, MD as Referring Physician (Obstetrics and Gynecology) Cammie Sickle, MD as Consulting Physician (Oncology) Corey Skains, MD as Consulting Physician (Cardiology) Marval Regal, NP as Nurse Practitioner (Nurse Practitioner) Manya Silvas, MD as Consulting Physician (Gastroenterology)      Objective:   Vitals:  Vitals:   02/06/18 1010  BP: 124/64  Pulse: 75  Temp: 99.1 F (37.3 C)  TempSrc: Oral  Weight: 188  lb (85.3 kg)  Height: 5\' 4"  (1.626 m)    Physical Exam  Constitutional: She is oriented to person, place, and time. She appears well-developed and well-nourished.  HENT:  Head: Normocephalic and atraumatic.  Right Ear: External ear normal.  Left Ear: External ear normal.  Nose: Nose normal.  Mouth/Throat: Oropharynx is clear and moist.  Eyes: Pupils are equal, round, and reactive to light. Conjunctivae and EOM are normal.  Neck: Normal range of motion. Neck supple.  Cardiovascular: Normal rate, regular rhythm, normal heart sounds and intact distal pulses.  Pulmonary/Chest: Effort normal and breath sounds normal.  Abdominal: Soft. Bowel sounds are normal.  Musculoskeletal: Normal range of motion.  Neurological: She is alert and oriented to person, place, and time.  Skin: Skin is warm and dry.  Psychiatric: She has a normal mood and affect. Her behavior is normal. Judgment and thought content normal.     Depression Screen PHQ 2/9 Scores 02/06/2018 12/18/2016 12/13/2015 12/13/2015  PHQ - 2 Score 2 2 0 0  PHQ- 9 Score 3 5 - -      Assessment & Plan:     Routine Health Maintenance and Physical Exam  Exercise Activities and Dietary recommendations Goals    None      Immunization History  Administered Date(s) Administered  . Influenza Split 05/27/2010, 05/26/2011, 05/31/2012  . Influenza, High Dose Seasonal PF 05/22/2014  . Influenza,inj,Quad PF,6+ Mos 05/23/2013, 05/09/2015, 09/10/2016, 06/03/2017  . Pneumococcal Conjugate-13 03/16/2014  . Pneumococcal Polysaccharide-23 02/20/2012  . Td 12/31/2003  . Tdap 04/18/2011    Health Maintenance  Topic Date Due  . INFLUENZA VACCINE  03/20/2018  . MAMMOGRAM  07/09/2019  . TETANUS/TDAP  04/17/2021  . DEXA SCAN  Completed  . PNA vac Low Risk Adult  Completed     Discussed health benefits of physical activity, and encouraged her to engage in regular exercise appropriate for her age and condition.  Metastatic ovarian  cancer Cardiomyopathy   I have done the exam and reviewed the chart and it is accurate to the best of my knowledge. Development worker, community has been used and  any errors in dictation or transcription are unintentional. Miguel Aschoff M.D. [facility]

## 2018-02-06 NOTE — Progress Notes (Signed)
Subjective:   Jamie Conner is a 77 y.o. female who presents for Medicare Annual (Subsequent) preventive examination.  Review of Systems:  N/A  Cardiac Risk Factors include: advanced age (>64men, >65 women);dyslipidemia;hypertension;obesity (BMI >30kg/m2)     Objective:     Vitals: BP 124/64 (BP Location: Right Arm)   Pulse 75   Temp 99.1 F (37.3 C) (Oral)   Ht 5\' 4"  (1.626 m)   Wt 188 lb 12.8 oz (85.6 kg)   BMI 32.41 kg/m   Body mass index is 32.41 kg/m.  Advanced Directives 02/06/2018 01/30/2018 01/15/2018 01/10/2018 01/10/2018 12/27/2017 12/10/2017  Does Patient Have a Medical Advance Directive? Yes Yes Yes Yes Yes Yes Yes  Type of Paramedic of [place];Living will - [place];Living will Living will;Healthcare Power of Attorney Living will;Healthcare Power of [place];Living will Living will  Does patient want to make changes to medical advance directive? - - No - Patient declined No - Patient declined No - Patient declined No - Patient declined No - Patient declined  Copy of [place] in Chart? Yes - Yes Yes Yes Yes -  Would patient like information on creating a medical advance directive? - - No - Patient declined - - No - Patient declined -    Tobacco Social History   Tobacco Use  Smoking Status Never Smoker  Smokeless Tobacco Never Used     Counseling given: Not Answered   Clinical Intake:  Pre-visit preparation completed: Yes  Pain : No/denies pain Pain Score: 0-No pain     Nutritional Status: BMI > 30  Obese Nutritional Risks: None Diabetes: No  How often do you need to have someone help you when you read instructions, pamphlets, or other written materials from your doctor or pharmacy?: 1 - Never  Interpreter Needed?: No  Information entered by :: [facility], LPN  Past Medical History:  Diagnosis Date  . Atrial fibrillation ([place])   . Breast cancer ([place])  1998  . Cancer of breast ([place]) 1998   Left/radiation  . CINV (chemotherapy-induced nausea and vomiting)   . GERD (gastroesophageal reflux disease)   . Hyperlipidemia   . Hypothyroidism   . Mitral insufficiency   . Ovarian cancer ([place])    s/p BSO optimal tumor debulking May 2016/chemo  . Personal history of chemotherapy since 2016   ovarian cancer  . Personal history of radiation therapy 1998  . PVC (premature ventricular contraction)    Past Surgical History:  Procedure Laterality Date  . ABDOMINAL HYSTERECTOMY    . BILATERAL SALPINGOOPHORECTOMY  May 2016   with optimal tumor debulking   . BREAST LUMPECTOMY Left 1998  . BREAST SURGERY     Left  . CESAREAN SECTION     x2  . CHOLECYSTECTOMY    . COLONOSCOPY  2006  . DEBULKING N/A 12/22/2014   Procedure: DEBULKING;  Surgeon: Gillis Ends, MD;  Location: [facility];  Service: Gynecology;  Laterality: N/A;  . LAPAROTOMY N/A 12/22/2014   Procedure: EXPLORATORY LAPAROTOMY;  Surgeon: Robert Bellow, MD;  Location: [facility];  Service: General;  Laterality: N/A;  . LAPAROTOMY WITH STAGING N/A 12/22/2014   Procedure: LAPAROTOMY WITH STAGING;  Surgeon: Gillis Ends, MD;  Location: [facility];  Service: Gynecology;  Laterality: N/A;  . LEFT HEART CATH AND CORONARY ANGIOGRAPHY N/A 12/10/2017   Procedure: LEFT HEART CATH AND CORONARY ANGIOGRAPHY;  Surgeon: Yolonda Kida, MD;  Location: [facility];  Service: Cardiovascular;  Laterality: N/A;  . PERIPHERAL VASCULAR CATHETERIZATION Left 12/22/2014   Procedure: PORTA CATH INSERTION;  Surgeon: Robert Bellow, MD;  Location: [facility];  Service: General;  Laterality: Left;  . PORTACATH PLACEMENT Right 12/22/2014   Procedure: INSERTION PORT-A-CATH;  Surgeon: Robert Bellow, MD;  Location: [facility];  Service: General;  Laterality: Right;  . SALPINGOOPHORECTOMY Bilateral 12/22/2014   Procedure: SALPINGO OOPHORECTOMY;  Surgeon: Gillis Ends, MD;  Location: [facility];   Service: Gynecology;  Laterality: Bilateral;  . UPPER GI ENDOSCOPY  09/25/04   hiatus hernia, schatzki ring and a single gastric polyp   Family History  Problem Relation Age of Onset  . Cancer Mother        breast, throat, and stomach  . Breast cancer Mother   . Heart disease Father   . Pulmonary embolism Sister   . Cancer Brother 60       angiosarcoma of the chest; carcinoid small intestinal tumor  . Hypertension Brother   . Stroke Brother   . Cancer Maternal Aunt        breast cancer  . Cancer Maternal Grandfather 8       pancreatic   Social History   Socioeconomic History  . Marital status: Married    Spouse name: Not on file  . Number of children: 2  . Years of education: Not on file  . Highest education level: Bachelor's degree (e.g., BA, AB, BS)  Occupational History  . Occupation: retired  Scientific laboratory technician  . Financial resource strain: Not hard at all  . Food insecurity:    Worry: Never true    Inability: Never true  . Transportation needs:    Medical: No    Non-medical: No  Tobacco Use  . Smoking status: Never Smoker  . Smokeless tobacco: Never Used  Substance and Sexual Activity  . Alcohol use: No  . Drug use: No  . Sexual activity: Not Currently  Lifestyle  . Physical activity:    Days per week: Not on file    Minutes per session: Not on file  . Stress: To some extent  Relationships  . Social connections:    Talks on phone: Not on file    Gets together: Not on file    Attends religious service: Not on file    Active member of club or organization: Not on file    Attends meetings of clubs or organizations: Not on file    Relationship status: Not on file  Other Topics Concern  . Not on file  Social History Narrative  . Not on file    Outpatient Encounter Medications as of 02/06/2018  Medication Sig  . acetaminophen (TYLENOL) 500 MG tablet Take 500 mg by mouth every 6 (six) hours as needed.  Marland Kitchen alum & mag hydroxide-simeth (MAALOX/MYLANTA) 200-200-20  MG/5ML suspension Take 15 mLs by mouth every 6 (six) hours as needed for indigestion or heartburn.  Marland Kitchen aspirin EC 325 MG tablet Take 1 tablet (325 mg total) by mouth daily.  . Cholecalciferol (VITAMIN D) 2000 units tablet Take 1 tablet (2,000 Units total) by mouth daily.  . Docosanol (ABREVA EX) Apply 1 application topically as needed. Fever blisters  . furosemide (LASIX) 20 MG tablet Take 0.5 tablets (10 mg total) by mouth daily.  Marland Kitchen levothyroxine (SYNTHROID, LEVOTHROID) 50 MCG tablet TAKE 1 TABLET EVERY DAY ON AN EMPTY STOMACH WITH A GLASS OF WATER AT LEAST 30-60 MINUTES BEFORE BREAKFAST  . lidocaine-prilocaine (EMLA)  cream Apply to port area 30-45 mins prior to chemo.  Marland Kitchen LORazepam (ATIVAN) 0.5 MG tablet Take 0.5 tablets (0.25 mg total) by mouth at bedtime.  Marland Kitchen Lysine 500 MG TABS Take 1 tablet by mouth daily as needed.   . magic mouthwash w/lidocaine SOLN Take 5 mLs by mouth every 4 (four) hours as needed.  . metoprolol succinate (TOPROL-XL) 25 MG 24 hr tablet Take 1 tablet by mouth daily.  . Multiple Vitamin (MULTIVITAMIN) capsule Take 1 capsule by mouth daily.  Marland Kitchen olaparib (LYNPARZA) 150 MG tablet Take 2 tablets (300 mg total) by mouth 2 (two) times daily. (Patient taking differently: Take 150 mg by mouth 2 (two) times daily. )  . ondansetron (ZOFRAN) 4 MG tablet One pill 30-60 mins prior to taking zejula to prevent nausea/vomitting.  . pantoprazole (PROTONIX) 40 MG tablet Take 1 tablet (40 mg total) by mouth daily.  . traMADol (ULTRAM) 50 MG tablet Take 1 tablet (50 mg total) by mouth every 6 (six) hours as needed.   No facility-administered encounter medications on file as of 02/06/2018.     Activities of Daily Living In your present state of health, do you have any difficulty performing the following activities: 02/06/2018 12/12/2017  Hearing? N -  Vision? N -  Difficulty concentrating or making decisions? N -  Walking or climbing stairs? N -  Dressing or bathing? N -  Doing errands,  shopping? N N  Preparing Food and eating ? N -  Using the Toilet? N -  In the past six months, have you accidently leaked urine? N -  Do you have problems with loss of bowel control? N -  Managing your Medications? N -  Managing your Finances? N -  Housekeeping or managing your Housekeeping? N -  Some recent data might be hidden    Patient Care Team: Jerrol Banana., MD as PCP - General (Family Medicine) Gillis Ends, MD as Referring Physician (Obstetrics and Gynecology) Cammie Sickle, MD as Consulting Physician (Oncology) Corey Skains, MD as Consulting Physician (Cardiology) Marval Regal, NP as Nurse Practitioner (Nurse Practitioner) Manya Silvas, MD as Consulting Physician (Gastroenterology)    Assessment:   This is a routine wellness examination for [facility].  Exercise Activities and Dietary recommendations Current Exercise Habits: The patient does not participate in regular exercise at present, Exercise limited by: Other - see comments(fatigue from chemotherapy)  Goals    None      Fall Risk Fall Risk  02/06/2018 04/01/2017 02/25/2017 12/18/2016 11/12/2016  Falls in the past year? No No No No No   Is the patient's home free of loose throw rugs in walkways, pet beds, electrical cords, etc?   yes      Grab bars in the bathroom? no      Handrails on the stairs?   no      Adequate lighting?   yes  Timed Get Up and Go performed: N/A  Depression Screen PHQ 2/9 Scores 02/06/2018 12/18/2016 12/13/2015 12/13/2015  PHQ - 2 Score 2 2 0 0  PHQ- 9 Score 3 5 - -     Cognitive Function: Pt declined screening today.         Immunization History  Administered Date(s) Administered  . Influenza Split 05/27/2010, 05/26/2011, 05/31/2012  . Influenza, High Dose Seasonal PF 05/22/2014  . Influenza,inj,Quad PF,6+ Mos 05/23/2013, 05/09/2015, 09/10/2016, 06/03/2017  . Pneumococcal Conjugate-13 03/16/2014  . Pneumococcal Polysaccharide-23 02/20/2012  .  Td 12/31/2003  .  Tdap 04/18/2011    Qualifies for Shingles Vaccine? Due for Shingles vaccine. Declined my offer to administer today. Education has been provided regarding the importance of this vaccine. Pt has been advised to call her insurance company to determine her out of pocket expense. Advised she may also receive this vaccine at her local pharmacy or Health Dept. Verbalized acceptance and understanding.  Screening Tests Health Maintenance  Topic Date Due  . INFLUENZA VACCINE  03/20/2018  . MAMMOGRAM  07/09/2019  . TETANUS/TDAP  04/17/2021  . DEXA SCAN  Completed  . PNA vac Low Risk Adult  Completed    Cancer Screenings: Lung: Low Dose CT Chest recommended if Age 47-80 years, 30 pack-year currently smoking OR have quit w/in 15years. Patient does not qualify. Breast:  Up to date on Mammogram? Yes   Up to date of Bone Density/Dexa? Yes Colorectal: N/A  Additional Screenings:  Hepatitis C Screening: N/A     Plan:  I have personally reviewed and addressed the Medicare Annual Wellness questionnaire and have noted the following in the patient's chart:  A. Medical and social history B. Use of alcohol, tobacco or illicit drugs  C. Current medications and supplements D. Functional ability and status E.  Nutritional status F.  Physical activity G. Advance directives H. List of other physicians I.  Hospitalizations, surgeries, and ER visits in previous 12 months J.  [place] such as hearing and vision if needed, cognitive and depression L. Referrals and appointments - none  In addition, I have reviewed and discussed with patient certain preventive protocols, quality metrics, and best practice recommendations. A written personalized care plan for preventive services as well as general preventive health recommendations were provided to patient.  See attached scanned questionnaire for additional information.   Signed,  Fabio Neighbors, LPN Nurse Health  Advisor   Nurse Recommendations: None.

## 2018-02-06 NOTE — Patient Instructions (Signed)
Jamie Conner , Thank you for taking time to come for your Medicare Wellness Visit. I appreciate your ongoing commitment to your health goals. Please review the following plan we discussed and let me know if I can assist you in the future.   Screening recommendations/referrals: Colonoscopy: N/A Mammogram: Up to date Bone Density: Up to date Recommended yearly ophthalmology/optometry visit for glaucoma screening and checkup Recommended yearly dental visit for hygiene and checkup  Vaccinations: Influenza vaccine: Up to date Pneumococcal vaccine: Up to date Tdap vaccine: Up to date Shingles vaccine: Pt declines today.     Advanced directives: Already on file.  Conditions/risks identified: None. Pt declines change in diet or exercise.  Next appointment: 10:20 AM today with Dr Rosanna Randy.    Preventive Care 75 Years and Older, Female Preventive care refers to lifestyle choices and visits with your health care provider that can promote health and wellness. What does preventive care include?  A yearly physical exam. This is also called an annual well check.  Dental exams once or twice a year.  Routine eye exams. Ask your health care provider how often you should have your eyes checked.  Personal lifestyle choices, including:  Daily care of your teeth and gums.  Regular physical activity.  Eating a healthy diet.  Avoiding tobacco and drug use.  Limiting alcohol use.  Practicing safe sex.  Taking low-dose aspirin every day.  Taking vitamin and mineral supplements as recommended by your health care provider. What happens during an annual well check? The services and screenings done by your health care provider during your annual well check will depend on your age, overall health, lifestyle risk factors, and family history of disease. Counseling  Your health care provider may ask you questions about your:  Alcohol use.  Tobacco use.  Drug use.  Emotional  well-being.  Home and relationship well-being.  Sexual activity.  Eating habits.  History of falls.  Memory and ability to understand (cognition).  Work and work Statistician.  Reproductive health. Screening  You may have the following tests or measurements:  Height, weight, and BMI.  Blood pressure.  Lipid and cholesterol levels. These may be checked every 5 years, or more frequently if you are over 75 years old.  Skin check.  Lung cancer screening. You may have this screening every year starting at age 40 if you have a 30-pack-year history of smoking and currently smoke or have quit within the past 15 years.  Fecal occult blood test (FOBT) of the stool. You may have this test every year starting at age 20.  Flexible sigmoidoscopy or colonoscopy. You may have a sigmoidoscopy every 5 years or a colonoscopy every 10 years starting at age 75.  Hepatitis C blood test.  Hepatitis B blood test.  Sexually transmitted disease (STD) testing.  Diabetes screening. This is done by checking your blood sugar (glucose) after you have not eaten for a while (fasting). You may have this done every 1-3 years.  Bone density scan. This is done to screen for osteoporosis. You may have this done starting at age 75.  Mammogram. This may be done every 1-2 years. Talk to your health care provider about how often you should have regular mammograms. Talk with your health care provider about your test results, treatment options, and if necessary, the need for more tests. Vaccines  Your health care provider may recommend certain vaccines, such as:  Influenza vaccine. This is recommended every year.  Tetanus, diphtheria, and acellular pertussis (Tdap,  Td) vaccine. You may need a Td booster every 10 years.  Zoster vaccine. You may need this after age 20.  Pneumococcal 13-valent conjugate (PCV13) vaccine. One dose is recommended after age 70.  Pneumococcal polysaccharide (PPSV23) vaccine. One  dose is recommended after age 52. Talk to your health care provider about which screenings and vaccines you need and how often you need them. This information is not intended to replace advice given to you by your health care provider. Make sure you discuss any questions you have with your health care provider. Document Released: 09/02/2015 Document Revised: 04/25/2016 Document Reviewed: 06/07/2015 Elsevier Interactive Patient Education  2017 [employer] Prevention in the Home Falls can cause injuries. They can happen to people of all ages. There are many things you can do to make your home safe and to help prevent falls. What can I do on the outside of my home?  Regularly fix the edges of walkways and driveways and fix any cracks.  Remove anything that might make you trip as you walk through a door, such as a raised step or threshold.  Trim any bushes or trees on the path to your home.  Use bright outdoor lighting.  Clear any walking paths of anything that might make someone trip, such as rocks or tools.  Regularly check to see if handrails are loose or broken. Make sure that both sides of any steps have handrails.  Any raised decks and porches should have guardrails on the edges.  Have any leaves, snow, or ice cleared regularly.  Use sand or salt on walking paths during winter.  Clean up any spills in your garage right away. This includes oil or grease spills. What can I do in the bathroom?  Use night lights.  Install grab bars by the toilet and in the tub and shower. Do not use towel bars as grab bars.  Use non-skid mats or decals in the tub or shower.  If you need to sit down in the shower, use a plastic, non-slip stool.  Keep the floor dry. Clean up any water that spills on the floor as soon as it happens.  Remove soap buildup in the tub or shower regularly.  Attach bath mats securely with double-sided non-slip rug tape.  Do not have throw rugs and other  things on the floor that can make you trip. What can I do in the bedroom?  Use night lights.  Make sure that you have a light by your bed that is easy to reach.  Do not use any sheets or blankets that are too big for your bed. They should not hang down onto the floor.  Have a firm chair that has side arms. You can use this for support while you get dressed.  Do not have throw rugs and other things on the floor that can make you trip. What can I do in the kitchen?  Clean up any spills right away.  Avoid walking on wet floors.  Keep items that you use a lot in easy-to-reach places.  If you need to reach something above you, use a strong step stool that has a grab bar.  Keep electrical cords out of the way.  Do not use floor polish or wax that makes floors slippery. If you must use wax, use non-skid floor wax.  Do not have throw rugs and other things on the floor that can make you trip. What can I do with my stairs?  Do not  leave any items on the stairs.  Make sure that there are handrails on both sides of the stairs and use them. Fix handrails that are broken or loose. Make sure that handrails are as long as the stairways.  Check any carpeting to make sure that it is firmly attached to the stairs. Fix any carpet that is loose or worn.  Avoid having throw rugs at the top or bottom of the stairs. If you do have throw rugs, attach them to the floor with carpet tape.  Make sure that you have a light switch at the top of the stairs and the bottom of the stairs. If you do not have them, ask someone to add them for you. What else can I do to help prevent falls?  Wear shoes that:  Do not have high heels.  Have rubber bottoms.  Are comfortable and fit you well.  Are closed at the toe. Do not wear sandals.  If you use a stepladder:  Make sure that it is fully opened. Do not climb a closed stepladder.  Make sure that both sides of the stepladder are locked into place.  Ask  someone to hold it for you, if possible.  Clearly mark and make sure that you can see:  Any grab bars or handrails.  First and last steps.  Where the edge of each step is.  Use tools that help you move around (mobility aids) if they are needed. These include:  Canes.  Walkers.  Scooters.  Crutches.  Turn on the lights when you go into a dark area. Replace any light bulbs as soon as they burn out.  Set up your furniture so you have a clear path. Avoid moving your furniture around.  If any of your floors are uneven, fix them.  If there are any pets around you, be aware of where they are.  Review your medicines with your doctor. Some medicines can make you feel dizzy. This can increase your chance of falling. Ask your doctor what other things that you can do to help prevent falls. This information is not intended to replace advice given to you by your health care provider. Make sure you discuss any questions you have with your health care provider. Document Released: 06/02/2009 Document Revised: 01/12/2016 Document Reviewed: 09/10/2014 Elsevier Interactive Patient Education  2017 [employer].

## 2018-02-14 ENCOUNTER — Other Ambulatory Visit: Payer: Self-pay

## 2018-02-14 ENCOUNTER — Inpatient Hospital Stay (HOSPITAL_BASED_OUTPATIENT_CLINIC_OR_DEPARTMENT_OTHER): Payer: PPO | Admitting: Internal Medicine

## 2018-02-14 ENCOUNTER — Inpatient Hospital Stay: Payer: PPO

## 2018-02-14 ENCOUNTER — Other Ambulatory Visit: Payer: Self-pay | Admitting: Internal Medicine

## 2018-02-14 ENCOUNTER — Encounter: Payer: Self-pay | Admitting: Internal Medicine

## 2018-02-14 VITALS — BP 122/86 | HR 60 | Temp 97.6°F | Resp 20 | Ht 64.0 in | Wt 190.6 lb

## 2018-02-14 DIAGNOSIS — Z7982 Long term (current) use of aspirin: Secondary | ICD-10-CM

## 2018-02-14 DIAGNOSIS — Z923 Personal history of irradiation: Secondary | ICD-10-CM

## 2018-02-14 DIAGNOSIS — Z853 Personal history of malignant neoplasm of breast: Secondary | ICD-10-CM

## 2018-02-14 DIAGNOSIS — I129 Hypertensive chronic kidney disease with stage 1 through stage 4 chronic kidney disease, or unspecified chronic kidney disease: Secondary | ICD-10-CM | POA: Diagnosis not present

## 2018-02-14 DIAGNOSIS — I429 Cardiomyopathy, unspecified: Secondary | ICD-10-CM

## 2018-02-14 DIAGNOSIS — Z800 Family history of malignant neoplasm of digestive organs: Secondary | ICD-10-CM

## 2018-02-14 DIAGNOSIS — K1230 Oral mucositis (ulcerative), unspecified: Secondary | ICD-10-CM | POA: Diagnosis not present

## 2018-02-14 DIAGNOSIS — C786 Secondary malignant neoplasm of retroperitoneum and peritoneum: Secondary | ICD-10-CM | POA: Diagnosis not present

## 2018-02-14 DIAGNOSIS — Z803 Family history of malignant neoplasm of breast: Secondary | ICD-10-CM

## 2018-02-14 DIAGNOSIS — C561 Malignant neoplasm of right ovary: Secondary | ICD-10-CM

## 2018-02-14 DIAGNOSIS — Z79899 Other long term (current) drug therapy: Secondary | ICD-10-CM

## 2018-02-14 DIAGNOSIS — G629 Polyneuropathy, unspecified: Secondary | ICD-10-CM

## 2018-02-14 DIAGNOSIS — Z90710 Acquired absence of both cervix and uterus: Secondary | ICD-10-CM | POA: Diagnosis not present

## 2018-02-14 DIAGNOSIS — K219 Gastro-esophageal reflux disease without esophagitis: Secondary | ICD-10-CM

## 2018-02-14 DIAGNOSIS — Z9221 Personal history of antineoplastic chemotherapy: Secondary | ICD-10-CM | POA: Diagnosis not present

## 2018-02-14 DIAGNOSIS — I4891 Unspecified atrial fibrillation: Secondary | ICD-10-CM

## 2018-02-14 DIAGNOSIS — R971 Elevated cancer antigen 125 [CA 125]: Secondary | ICD-10-CM

## 2018-02-14 DIAGNOSIS — Z90722 Acquired absence of ovaries, bilateral: Secondary | ICD-10-CM

## 2018-02-14 DIAGNOSIS — E039 Hypothyroidism, unspecified: Secondary | ICD-10-CM

## 2018-02-14 DIAGNOSIS — I700 Atherosclerosis of aorta: Secondary | ICD-10-CM

## 2018-02-14 DIAGNOSIS — Z9223 Personal history of estrogen therapy: Secondary | ICD-10-CM

## 2018-02-14 DIAGNOSIS — C569 Malignant neoplasm of unspecified ovary: Secondary | ICD-10-CM

## 2018-02-14 DIAGNOSIS — N183 Chronic kidney disease, stage 3 (moderate): Secondary | ICD-10-CM

## 2018-02-14 DIAGNOSIS — I252 Old myocardial infarction: Secondary | ICD-10-CM

## 2018-02-14 DIAGNOSIS — E785 Hyperlipidemia, unspecified: Secondary | ICD-10-CM

## 2018-02-14 LAB — URINALYSIS, COMPLETE (UACMP) WITH MICROSCOPIC
BILIRUBIN URINE: NEGATIVE
Glucose, UA: NEGATIVE mg/dL
HGB URINE DIPSTICK: NEGATIVE
KETONES UR: NEGATIVE mg/dL
Leukocytes, UA: NEGATIVE
NITRITE: NEGATIVE
PH: 5 (ref 5.0–8.0)
Protein, ur: NEGATIVE mg/dL
SPECIFIC GRAVITY, URINE: 1.005 (ref 1.005–1.030)
Squamous Epithelial / LPF: NONE SEEN (ref 0–5)

## 2018-02-14 LAB — CBC WITH DIFFERENTIAL/PLATELET
BASOS PCT: 1 %
Basophils Absolute: 0 10*3/uL (ref 0–0.1)
EOS ABS: 0.1 10*3/uL (ref 0–0.7)
EOS PCT: 2 %
HCT: 40.1 % (ref 35.0–47.0)
Hemoglobin: 13.6 g/dL (ref 12.0–16.0)
LYMPHS ABS: 3.6 10*3/uL (ref 1.0–3.6)
Lymphocytes Relative: 56 %
MCH: 32.8 pg (ref 26.0–34.0)
MCHC: 33.9 g/dL (ref 32.0–36.0)
MCV: 96.8 fL (ref 80.0–100.0)
Monocytes Absolute: 0.6 10*3/uL (ref 0.2–0.9)
Monocytes Relative: 10 %
Neutro Abs: 2 10*3/uL (ref 1.4–6.5)
Neutrophils Relative %: 31 %
PLATELETS: 243 10*3/uL (ref 150–440)
RBC: 4.14 MIL/uL (ref 3.80–5.20)
RDW: 15.3 % — ABNORMAL HIGH (ref 11.5–14.5)
WBC: 6.5 10*3/uL (ref 3.6–11.0)

## 2018-02-14 LAB — COMPREHENSIVE METABOLIC PANEL
ALK PHOS: 70 U/L (ref 38–126)
ALT: 13 U/L (ref 0–44)
AST: 23 U/L (ref 15–41)
Albumin: 3.7 g/dL (ref 3.5–5.0)
Anion gap: 9 (ref 5–15)
BILIRUBIN TOTAL: 0.6 mg/dL (ref 0.3–1.2)
BUN: 17 mg/dL (ref 8–23)
CALCIUM: 9.2 mg/dL (ref 8.9–10.3)
CHLORIDE: 108 mmol/L (ref 98–111)
CO2: 25 mmol/L (ref 22–32)
CREATININE: 1.16 mg/dL — AB (ref 0.44–1.00)
GFR, EST AFRICAN AMERICAN: 52 mL/min — AB (ref >60)
GFR, EST NON AFRICAN AMERICAN: 45 mL/min — AB (ref >60)
Glucose, Bld: 93 mg/dL (ref 70–99)
Potassium: 4.8 mmol/L (ref 3.5–5.1)
Sodium: 142 mmol/L (ref 135–145)
TOTAL PROTEIN: 6.9 g/dL (ref 6.5–8.1)

## 2018-02-14 NOTE — Assessment & Plan Note (Addendum)
#   RECURRENT PLATINUM REFRACTORY HIGH GRADE SEROUS ovarian cancer- march 8th CT/PET- Progression; with possible cecal implants; increasing vaginal cuff thickness.   #Currently on Lynparza 300 mg twice a day; clinically stable.  Pending Ca125 ay.  Since patient is tolerating well recommend going up to 2 pills in the morning 1 pill evening [patient preference]. Recommend checking bmp in 1 week.   #Nonischemic cardiomyopathy/drop in ejection fraction question Takotsubo syndrome.  Clinically stable  # Mucositis- reocmmdn salt/baking/magic mouth wash  # 1 week- bmp; follow up in 2 week/cbc-bmp/port flush/MD.   Addendum: ca-125 goin up at 132. Monitor for now/ continue current therapy given multiple interupptions in therapy.

## 2018-02-14 NOTE — Progress Notes (Signed)
[facility] OFFICE PROGRESS NOTE  Patient Care Team: Jerrol Banana., MD as PCP - General (Family Medicine) Gillis Ends, MD as Referring Physician (Obstetrics and Gynecology) Cammie Sickle, MD as Consulting Physician (Oncology) Corey Skains, MD as Consulting Physician (Cardiology) Marval Regal, NP as Nurse Practitioner (Nurse Practitioner) Manya Silvas, MD as Consulting Physician (Gastroenterology)  Cancer Staging Malignant neoplasm of ovary [facility]) Staging form: Ovary, AJCC 7th Edition - Clinical: Stage IIIC (T3c, N0, M0) - Unsigned    Oncology History   # April- MAY 2016- HIGH GRADE SEROUS OVARIAN CANCER STAGE IIIC; CA-125 +2300+;  s/p OPTIMAL DEBULKING SURGERY   # MAY 2016[name] [finished in Sep 19th 2017]  # MAY CT 2017- RECURRENT/Peritoneal carcinomatosis/pelvic implant ~1.2cm/Ca-125-34;   # July 3rd 2017- CARBO-DOXIL q 4W [with neulasta]; CT May 11 2016- slight increase peritoneal / stable nodules. Cont chemo [finished DEC 2017]. DEC 28th 2017 CT- Increase in peritoneal deposits; increasing Ca 125 [60s];  # Jan 2018- carbo-Taxol-Avastin x6 cycles; Aug 2018- CR; Aug 2018- start Avastin Maintenance; MARCH 2019- PROGRESSION- STOP avastin.   # MID April 2019- Zejula [300 mg/day]; 4 days- later STOPPED sec to Chest pain   # May 2019- TAKASUBO ? [s/p cardiac cath- Dr.Kowalski]  # G-1-2 hand foot syndrome-   # LEFT BREAST CA s/p Lumpec & RT s/p TAM   # Afib [cardiology]; JUNE 2017- MUGA 62%  Jan 2018- MOLECULAR TESTING- ATM DELETERIOUS HETEROZYGOUS mutation; TUMOR BRCA- NEG; Myriad genomic instability- NEGATIVE;   # NGS/Omniseq- BRCA-2 copy loss/ * [april2019]; NEG- BRCA Mutations/N-TRK/MSI-STABLE.  ---------------------------------------------------------------------    DIAGNOSIS: MAY 2017; RECURRENT OVARIAN CA-high-grade serous/platinum refractory  STAGE: IV        ;GOALS: PALLIATIVE  CURRENT/MOST  RECENT THERAPY: June 2nd week  Lynparza      Malignant neoplasm of ovary ([place])    Ovarian cancer, unspecified laterality ([place])      INTERVAL HISTORY:  Jamie Conner 77 y.o.  female pleasant patient above history of platinum refractory high-grade serous ovarian cancer currently on [name] is here for follow-up.  Patient denies any nausea vomiting abdominal pain.  Denies any chest pain or shortness of breath or cough. Complains mild soreness in the mouth.   Review of Systems  Constitutional: Negative for chills, diaphoresis, fever, malaise/fatigue and weight loss.  HENT: Negative for nosebleeds and sore throat.   Eyes: Negative for double vision.  Respiratory: Negative for cough, hemoptysis, sputum production, shortness of breath and wheezing.   Cardiovascular: Negative for chest pain, palpitations, orthopnea and leg swelling.  Gastrointestinal: Negative for abdominal pain, blood in stool, constipation, diarrhea, heartburn, melena, nausea and vomiting.  Genitourinary: Negative for dysuria, frequency and urgency.  Musculoskeletal: Positive for back pain and joint pain.  Skin: Negative.  Negative for itching and rash.  Neurological: Negative for dizziness, tingling, focal weakness, weakness and headaches.  Endo/Heme/Allergies: Does not bruise/bleed easily.  Psychiatric/Behavioral: Negative for depression. The patient is not nervous/anxious and does not have insomnia.       PAST MEDICAL HISTORY :  Past Medical History:  Diagnosis Date  . Atrial fibrillation ([place])   . Breast cancer ([place]) 1998  . Cancer of breast ([place]) 1998   Left/radiation  . CINV (chemotherapy-induced nausea and vomiting)   . GERD (gastroesophageal reflux disease)   . Hyperlipidemia   . Hypothyroidism   . Mitral insufficiency   . Ovarian cancer ([place])    s/p BSO optimal tumor debulking May 2016/chemo  .  Personal history of chemotherapy since 2016   ovarian cancer  . Personal history of radiation therapy  1998  . PVC (premature ventricular contraction)     PAST SURGICAL HISTORY :   Past Surgical History:  Procedure Laterality Date  . ABDOMINAL HYSTERECTOMY    . BILATERAL SALPINGOOPHORECTOMY  May 2016   with optimal tumor debulking   . BREAST LUMPECTOMY Left 1998  . BREAST SURGERY     Left  . CESAREAN SECTION     x2  . CHOLECYSTECTOMY    . COLONOSCOPY  2006  . DEBULKING N/A 12/22/2014   Procedure: DEBULKING;  Surgeon: Gillis Ends, MD;  Location: [facility] ORS;  Service: Gynecology;  Laterality: N/A;  . LAPAROTOMY N/A 12/22/2014   Procedure: EXPLORATORY LAPAROTOMY;  Surgeon: Robert Bellow, MD;  Location: [facility] ORS;  Service: General;  Laterality: N/A;  . LAPAROTOMY WITH STAGING N/A 12/22/2014   Procedure: LAPAROTOMY WITH STAGING;  Surgeon: Gillis Ends, MD;  Location: [facility] OR[facility];  Service: Gynecology;  Laterality: N/A;  . LEFT HEART CATH AND CORONARY ANGIOGRAPHY N/A 12/10/2017   Procedure: LEFT HEART CATH AND CORONARY ANGIOGRAPHY;  Surgeon: Yolonda Kida, MD;  Location: [facility] CV LAB;  Service: Cardiovascular;  Laterality: N/A;  . PERIPHERAL VASCULAR CATHETERIZATION Left 12/22/2014   Procedure: PORTA CATH INSERTION;  Surgeon: Robert Bellow, MD;  Location: [facility] OR[facility];  Service: General;  Laterality: Left;  . PORTACATH PLACEMENT Right 12/22/2014   Procedure: INSERTION PORT-A-CATH;  Surgeon: Robert Bellow, MD;  Location: [facility] OR[facility];  Service: General;  Laterality: Right;  . SALPINGOOPHORECTOMY Bilateral 12/22/2014   Procedure: SALPINGO OOPHORECTOMY;  Surgeon: Gillis Ends, MD;  Location: [facility] ORS;  Service: Gynecology;  Laterality: Bilateral;  . UPPER GI ENDOSCOPY  09/25/04   hiatus hernia, schatzki ring and a single gastric polyp    FAMILY HISTORY :   Family History  Problem Relation Age of Onset  . Cancer Mother        breast, throat, and stomach  . Breast cancer Mother   . Heart disease Father   . Pulmonary embolism Sister   . Cancer Brother 60        angiosarcoma of the chest; carcinoid small intestinal tumor  . Hypertension Brother   . Stroke Brother   . Cancer Maternal Aunt        breast cancer  . Cancer Maternal Grandfather 27       pancreatic    SOCIAL HISTORY:   Social History   Tobacco Use  . Smoking status: Never Smoker  . Smokeless tobacco: Never Used  Substance Use Topics  . Alcohol use: No  . Drug use: No    ALLERGIES:  is allergic to carafate [sucralfate] and sulfa antibiotics.  MEDICATIONS:  Current Outpatient Medications  Medication Sig Dispense Refill  . acetaminophen (TYLENOL) 500 MG tablet Take 500 mg by mouth every 6 (six) hours as needed.    Marland Kitchen aspirin EC 325 MG tablet Take 1 tablet (325 mg total) by mouth daily. 100 tablet 3  . Cholecalciferol (VITAMIN D) 2000 units tablet Take 1 tablet (2,000 Units total) by mouth daily. 30 tablet 12  . Docosanol (ABREVA EX) Apply 1 application topically as needed. Fever blisters    . levothyroxine (SYNTHROID, LEVOTHROID) 50 MCG tablet TAKE 1 TABLET EVERY DAY ON AN EMPTY STOMACH WITH A GLASS OF WATER AT LEAST 30-60 MINUTES BEFORE BREAKFAST 30 tablet 12  . lidocaine-prilocaine (EMLA) cream Apply to port area 30-45 mins prior to  chemo. 30 g 5  . LORazepam (ATIVAN) 0.5 MG tablet Take 0.5 tablets (0.25 mg total) by mouth at bedtime. 30 tablet 4  . Lysine 500 MG TABS Take 1 tablet by mouth daily as needed.     . magic mouthwash w/lidocaine SOLN Take 5 mLs by mouth every 4 (four) hours as needed.  1  . metoprolol succinate (TOPROL-XL) 25 MG 24 hr tablet Take 1 tablet by mouth daily.    . Multiple Vitamin (MULTIVITAMIN) capsule Take 1 capsule by mouth daily.    Marland Kitchen olaparib (LYNPARZA) 150 MG tablet Take 2 tablets (300 mg total) by mouth 2 (two) times daily. (Patient taking differently: Take 150 mg by mouth 2 (two) times daily. ) 120 tablet 3  . ondansetron (ZOFRAN) 4 MG tablet One pill 30-60 mins prior to taking zejula to prevent nausea/vomitting. 45 tablet 3  . pantoprazole  (PROTONIX) 40 MG tablet Take 1 tablet (40 mg total) by mouth daily. 30 tablet 1  . traMADol (ULTRAM) 50 MG tablet Take 1 tablet (50 mg total) by mouth every 6 (six) hours as needed. 30 tablet 0  . alum & mag hydroxide-simeth (MAALOX/MYLANTA) 200-200-20 MG/5ML suspension Take 15 mLs by mouth every 6 (six) hours as needed for indigestion or heartburn. (Patient not taking: Reported on 02/14/2018) 355 mL 0  . furosemide (LASIX) 20 MG tablet Take 0.5 tablets (10 mg total) by mouth daily. (Patient not taking: Reported on 02/14/2018) 30 tablet 0   No current facility-administered medications for this visit.     PHYSICAL EXAMINATION: ECOG PERFORMANCE STATUS: 1 - Symptomatic but completely ambulatory  BP 122/86 (Patient Position: Sitting)   Pulse 60   Temp 97.6 F (36.4 C) (Tympanic)   Resp 20   Ht '5\' 4"'$  (1.626 m)   Wt 190 lb 9.6 oz (86.5 kg)   BMI 32.72 kg/m   Filed Weights   02/14/18 1424  Weight: 190 lb 9.6 oz (86.5 kg)    GENERAL: Well-nourished well-developed; Alert, no distress and comfortable.  Accompanied by family.  EYES: no pallor or icterus OROPHARYNX: no thrush or ulceration; NECK: supple; no lymph nodes felt. LYMPH:  no palpable lymphadenopathy in the axillary or inguinal regions LUNGS: Decreased breath sounds auscultation bilaterally. No wheeze or crackles HEART/CVS: regular rate & rhythm and no murmurs; No lower extremity edema ABDOMEN:abdomen soft, non-tender and normal bowel sounds. No hepatomegaly or splenomegaly.  Musculoskeletal:no cyanosis of digits and no clubbing  PSYCH: alert & oriented x 3 with fluent speech NEURO: no focal motor/sensory deficits SKIN:  no rashes or significant lesions    LABORATORY DATA:  I have reviewed the data as listed    Component Value Date/Time   NA 142 02/14/2018 1358   NA 140 12/14/2014 1037   K 4.8 02/14/2018 1358   K 4.4 12/14/2014 1037   CL 108 02/14/2018 1358   CL 106 12/14/2014 1037   CO2 25 02/14/2018 1358   CO2 26  12/14/2014 1037   GLUCOSE 93 02/14/2018 1358   GLUCOSE 87 12/14/2014 1037   BUN 17 02/14/2018 1358   BUN 12 12/14/2014 1037   CREATININE 1.16 (H) 02/14/2018 1358   CREATININE 0.97 12/14/2014 1037   CALCIUM 9.2 02/14/2018 1358   CALCIUM 9.2 12/14/2014 1037   PROT 6.9 02/14/2018 1358   PROT 7.3 12/14/2014 1037   ALBUMIN 3.7 02/14/2018 1358   ALBUMIN 3.8 12/14/2014 1037   AST 23 02/14/2018 1358   AST 20 12/14/2014 1037   ALT 13 02/14/2018 1358  ALT 11 (L) 12/14/2014 1037   ALKPHOS 70 02/14/2018 1358   ALKPHOS 138 (H) 12/14/2014 1037   BILITOT 0.6 02/14/2018 1358   BILITOT 0.5 12/14/2014 1037   GFRNONAA 45 (L) 02/14/2018 1358   GFRNONAA 59 (L) 12/14/2014 1037   GFRAA 52 (L) 02/14/2018 1358   GFRAA >60 12/14/2014 1037    No results found for: SPEP, UPEP  Lab Results  Component Value Date   WBC 6.5 02/14/2018   NEUTROABS 2.0 02/14/2018   HGB 13.6 02/14/2018   HCT 40.1 02/14/2018   MCV 96.8 02/14/2018   PLT 243 02/14/2018      Chemistry      Component Value Date/Time   NA 142 02/14/2018 1358   NA 140 12/14/2014 1037   K 4.8 02/14/2018 1358   K 4.4 12/14/2014 1037   CL 108 02/14/2018 1358   CL 106 12/14/2014 1037   CO2 25 02/14/2018 1358   CO2 26 12/14/2014 1037   BUN 17 02/14/2018 1358   BUN 12 12/14/2014 1037   CREATININE 1.16 (H) 02/14/2018 1358   CREATININE 0.97 12/14/2014 1037   GLU 84 03/16/2014      Component Value Date/Time   CALCIUM 9.2 02/14/2018 1358   CALCIUM 9.2 12/14/2014 1037   ALKPHOS 70 02/14/2018 1358   ALKPHOS 138 (H) 12/14/2014 1037   AST 23 02/14/2018 1358   AST 20 12/14/2014 1037   ALT 13 02/14/2018 1358   ALT 11 (L) 12/14/2014 1037   BILITOT 0.6 02/14/2018 1358   BILITOT 0.5 12/14/2014 1037       RADIOGRAPHIC STUDIES: I have personally reviewed the radiological images as listed and agreed with the findings in the report. No results found.   ASSESSMENT & PLAN:  Ovarian cancer, unspecified laterality ([place]) # RECURRENT PLATINUM  REFRACTORY HIGH GRADE SEROUS ovarian cancer- march 8th CT/PET- Progression; with possible cecal implants; increasing vaginal cuff thickness.   #Currently on Lynparza 300 mg twice a day; clinically stable.  Pending Ca125 ay.  Since patient is tolerating well recommend going up to 2 pills in the morning 1 pill evening [patient preference]. Recommend checking bmp in 1 week.   #Nonischemic cardiomyopathy/drop in ejection fraction question Takotsubo syndrome.  Clinically stable  # Mucositis- reocmmdn salt/baking/magic mouth wash  # 1 week- bmp; follow up in 2 week/cbc-bmp/port flush/MD.   Addendum: ca-125 goin up at 132. Monitor for now/ continue current therapy given multiple interupptions in therapy.    Orders Placed This Encounter  Procedures  . CBC with Differential/Platelet    Standing Status:   Future    Standing Expiration Date:   02/15/2019  . Comprehensive metabolic panel    Standing Status:   Future    Standing Expiration Date:   02/15/2019  . Basic metabolic panel    Standing Status:   Future    Standing Expiration Date:   02/15/2019   All questions were answered. The patient knows to call the clinic with any problems, questions or concerns.      Cammie Sickle, MD 02/16/2018 8:48 AM

## 2018-02-15 LAB — CA 125: CANCER ANTIGEN (CA) 125: 133.2 U/mL — AB (ref 0.0–38.1)

## 2018-02-17 ENCOUNTER — Telehealth: Payer: Self-pay | Admitting: *Deleted

## 2018-02-17 DIAGNOSIS — C561 Malignant neoplasm of right ovary: Secondary | ICD-10-CM

## 2018-02-17 NOTE — Telephone Encounter (Signed)
-----   Message from Cammie Sickle, MD sent at 02/16/2018  6:38 PM EDT ----- Please inform patient that her tumor marker is elevated at 133/rising; which is likely expected of her not being on treatment long enough/multiple interruptions.   #Please order Ca-125 at the next visit in 3 weeks. Thx

## 2018-02-17 NOTE — Telephone Encounter (Signed)
ca125 entered per md order

## 2018-02-18 NOTE — Telephone Encounter (Signed)
Contacted Patient - pt was informed of test results and plan of care.

## 2018-02-21 ENCOUNTER — Telehealth: Payer: Self-pay | Admitting: Internal Medicine

## 2018-02-21 ENCOUNTER — Other Ambulatory Visit: Payer: Self-pay | Admitting: *Deleted

## 2018-02-21 ENCOUNTER — Inpatient Hospital Stay: Payer: PPO | Attending: Internal Medicine

## 2018-02-21 DIAGNOSIS — Z90722 Acquired absence of ovaries, bilateral: Secondary | ICD-10-CM | POA: Insufficient documentation

## 2018-02-21 DIAGNOSIS — R5383 Other fatigue: Secondary | ICD-10-CM | POA: Diagnosis not present

## 2018-02-21 DIAGNOSIS — C569 Malignant neoplasm of unspecified ovary: Secondary | ICD-10-CM | POA: Diagnosis not present

## 2018-02-21 DIAGNOSIS — R110 Nausea: Secondary | ICD-10-CM | POA: Insufficient documentation

## 2018-02-21 DIAGNOSIS — Z853 Personal history of malignant neoplasm of breast: Secondary | ICD-10-CM | POA: Diagnosis not present

## 2018-02-21 DIAGNOSIS — K219 Gastro-esophageal reflux disease without esophagitis: Secondary | ICD-10-CM | POA: Insufficient documentation

## 2018-02-21 DIAGNOSIS — K6289 Other specified diseases of anus and rectum: Secondary | ICD-10-CM | POA: Insufficient documentation

## 2018-02-21 DIAGNOSIS — Z79899 Other long term (current) drug therapy: Secondary | ICD-10-CM | POA: Insufficient documentation

## 2018-02-21 DIAGNOSIS — I4891 Unspecified atrial fibrillation: Secondary | ICD-10-CM | POA: Diagnosis not present

## 2018-02-21 DIAGNOSIS — Z9223 Personal history of estrogen therapy: Secondary | ICD-10-CM | POA: Diagnosis not present

## 2018-02-21 DIAGNOSIS — R5381 Other malaise: Secondary | ICD-10-CM | POA: Insufficient documentation

## 2018-02-21 DIAGNOSIS — C561 Malignant neoplasm of right ovary: Secondary | ICD-10-CM

## 2018-02-21 DIAGNOSIS — Z923 Personal history of irradiation: Secondary | ICD-10-CM | POA: Insufficient documentation

## 2018-02-21 DIAGNOSIS — Z792 Long term (current) use of antibiotics: Secondary | ICD-10-CM | POA: Insufficient documentation

## 2018-02-21 DIAGNOSIS — Z7982 Long term (current) use of aspirin: Secondary | ICD-10-CM | POA: Insufficient documentation

## 2018-02-21 DIAGNOSIS — R590 Localized enlarged lymph nodes: Secondary | ICD-10-CM | POA: Insufficient documentation

## 2018-02-21 DIAGNOSIS — R6883 Chills (without fever): Secondary | ICD-10-CM | POA: Insufficient documentation

## 2018-02-21 DIAGNOSIS — K5900 Constipation, unspecified: Secondary | ICD-10-CM | POA: Insufficient documentation

## 2018-02-21 DIAGNOSIS — E785 Hyperlipidemia, unspecified: Secondary | ICD-10-CM | POA: Insufficient documentation

## 2018-02-21 DIAGNOSIS — E039 Hypothyroidism, unspecified: Secondary | ICD-10-CM | POA: Diagnosis not present

## 2018-02-21 DIAGNOSIS — Z9221 Personal history of antineoplastic chemotherapy: Secondary | ICD-10-CM | POA: Diagnosis not present

## 2018-02-21 LAB — CBC WITH DIFFERENTIAL/PLATELET
BASOS PCT: 0 %
Basophils Absolute: 0 10*3/uL (ref 0–0.1)
Eosinophils Absolute: 0.1 10*3/uL (ref 0–0.7)
Eosinophils Relative: 2 %
HEMATOCRIT: 38.7 % (ref 35.0–47.0)
HEMOGLOBIN: 13.2 g/dL (ref 12.0–16.0)
Lymphocytes Relative: 55 %
Lymphs Abs: 3.3 10*3/uL (ref 1.0–3.6)
MCH: 33.1 pg (ref 26.0–34.0)
MCHC: 34.1 g/dL (ref 32.0–36.0)
MCV: 97 fL (ref 80.0–100.0)
MONO ABS: 0.6 10*3/uL (ref 0.2–0.9)
MONOS PCT: 10 %
NEUTROS ABS: 2 10*3/uL (ref 1.4–6.5)
NEUTROS PCT: 33 %
Platelets: 231 10*3/uL (ref 150–440)
RBC: 3.99 MIL/uL (ref 3.80–5.20)
RDW: 15.4 % — AB (ref 11.5–14.5)
WBC: 6.1 10*3/uL (ref 3.6–11.0)

## 2018-02-21 LAB — BASIC METABOLIC PANEL
ANION GAP: 6 (ref 5–15)
BUN: 15 mg/dL (ref 8–23)
CHLORIDE: 106 mmol/L (ref 98–111)
CO2: 25 mmol/L (ref 22–32)
Calcium: 9.4 mg/dL (ref 8.9–10.3)
Creatinine, Ser: 1.07 mg/dL — ABNORMAL HIGH (ref 0.44–1.00)
GFR calc non Af Amer: 50 mL/min — ABNORMAL LOW (ref >60)
GFR, EST AFRICAN AMERICAN: 58 mL/min — AB (ref >60)
GLUCOSE: 84 mg/dL (ref 70–99)
POTASSIUM: 4.8 mmol/L (ref 3.5–5.1)
Sodium: 137 mmol/L (ref 135–145)

## 2018-02-21 NOTE — Telephone Encounter (Signed)
Spoke with patient. She gave verbal understanding of the plan of care. She also inquired why another ca125 was drawn today, when she was supposed to have this collected next week. I explained to the patient that this test was collected too soon. I contacted the lab to have this test cnl.

## 2018-02-21 NOTE — Telephone Encounter (Signed)
Please inform patient that since her kidney numbers are holding steady; I would recommend taking Lynparza 2 pills in the morning and 2 pills in the evening.  Follow-up as planned/no new recommendations.  Thx, GB

## 2018-02-24 ENCOUNTER — Other Ambulatory Visit: Payer: Self-pay | Admitting: Family Medicine

## 2018-02-24 DIAGNOSIS — E039 Hypothyroidism, unspecified: Secondary | ICD-10-CM

## 2018-02-25 MED FILL — LYNPARZA 150 MG TABLET: 150 | 30 days supply | Qty: 120 | Fill #1

## 2018-02-25 NOTE — Telephone Encounter (Signed)
Pharmacy requesting refills. Thanks!  

## 2018-02-27 ENCOUNTER — Other Ambulatory Visit: Payer: Self-pay

## 2018-02-27 ENCOUNTER — Inpatient Hospital Stay: Payer: PPO

## 2018-02-27 ENCOUNTER — Inpatient Hospital Stay (HOSPITAL_BASED_OUTPATIENT_CLINIC_OR_DEPARTMENT_OTHER): Payer: PPO | Admitting: Internal Medicine

## 2018-02-27 ENCOUNTER — Encounter: Payer: Self-pay | Admitting: Internal Medicine

## 2018-02-27 VITALS — BP 124/80 | HR 68 | Temp 97.8°F | Resp 20 | Ht 64.0 in | Wt 190.0 lb

## 2018-02-27 DIAGNOSIS — C569 Malignant neoplasm of unspecified ovary: Secondary | ICD-10-CM | POA: Diagnosis not present

## 2018-02-27 LAB — CBC WITH DIFFERENTIAL/PLATELET
BASOS ABS: 0 10*3/uL (ref 0–0.1)
Basophils Relative: 1 %
EOS ABS: 0.1 10*3/uL (ref 0–0.7)
EOS PCT: 2 %
HCT: 38.3 % (ref 35.0–47.0)
Hemoglobin: 13.2 g/dL (ref 12.0–16.0)
LYMPHS ABS: 2.9 10*3/uL (ref 1.0–3.6)
Lymphocytes Relative: 48 %
MCH: 33.3 pg (ref 26.0–34.0)
MCHC: 34.4 g/dL (ref 32.0–36.0)
MCV: 96.9 fL (ref 80.0–100.0)
MONO ABS: 0.6 10*3/uL (ref 0.2–0.9)
Monocytes Relative: 10 %
Neutro Abs: 2.3 10*3/uL (ref 1.4–6.5)
Neutrophils Relative %: 39 %
PLATELETS: 225 10*3/uL (ref 150–440)
RBC: 3.95 MIL/uL (ref 3.80–5.20)
RDW: 15.8 % — AB (ref 11.5–14.5)
WBC: 6 10*3/uL (ref 3.6–11.0)

## 2018-02-27 LAB — COMPREHENSIVE METABOLIC PANEL
ALBUMIN: 3.5 g/dL (ref 3.5–5.0)
ALK PHOS: 66 U/L (ref 38–126)
ALT: 13 U/L (ref 0–44)
AST: 24 U/L (ref 15–41)
Anion gap: 8 (ref 5–15)
BILIRUBIN TOTAL: 0.7 mg/dL (ref 0.3–1.2)
BUN: 16 mg/dL (ref 8–23)
CO2: 23 mmol/L (ref 22–32)
CREATININE: 1.18 mg/dL — AB (ref 0.44–1.00)
Calcium: 9.5 mg/dL (ref 8.9–10.3)
Chloride: 108 mmol/L (ref 98–111)
GFR calc Af Amer: 51 mL/min — ABNORMAL LOW (ref >60)
GFR calc non Af Amer: 44 mL/min — ABNORMAL LOW (ref >60)
GLUCOSE: 101 mg/dL — AB (ref 70–99)
Potassium: 4.1 mmol/L (ref 3.5–5.1)
Sodium: 139 mmol/L (ref 135–145)
TOTAL PROTEIN: 6.5 g/dL (ref 6.5–8.1)

## 2018-02-27 MED ORDER — HEPARIN SOD (PORK) LOCK FLUSH 100 UNIT/ML IV SOLN
500.0000 [IU] | Freq: Once | INTRAVENOUS | Status: AC
  Administered 2018-02-27: 500 [IU] via INTRAVENOUS

## 2018-02-27 MED ORDER — SODIUM CHLORIDE 0.9% FLUSH
10.0000 mL | INTRAVENOUS | Status: DC | PRN
  Administered 2018-02-27: 10 mL via INTRAVENOUS
  Filled 2018-02-27: qty 10

## 2018-02-27 NOTE — Progress Notes (Signed)
[facility] OFFICE PROGRESS NOTE  Patient Care Team: Jerrol Banana., MD as PCP - General (Family Medicine) Gillis Ends, MD as Referring Physician (Obstetrics and Gynecology) Cammie Sickle, MD as Consulting Physician (Oncology) Corey Skains, MD as Consulting Physician (Cardiology) Marval Regal, NP as Nurse Practitioner (Nurse Practitioner) Manya Silvas, MD as Consulting Physician (Gastroenterology)  Cancer Staging Malignant neoplasm of ovary [facility]) Staging form: Ovary, AJCC 7th Edition - Clinical: Stage IIIC (T3c, N0, M0) - Unsigned    Oncology History   # April- MAY 2016- HIGH GRADE SEROUS OVARIAN CANCER STAGE IIIC; CA-125 +2300+;  s/p OPTIMAL DEBULKING SURGERY   # MAY 2016[name] DD [finished in Sep 19th 2017]  # MAY CT 2017- RECURRENT/Peritoneal carcinomatosis/pelvic implant ~1.2cm/Ca-125-34;   # July 3rd 2017- CARBO-DOXIL q 4W [with neulasta]; CT May 11 2016- slight increase peritoneal / stable nodules. Cont chemo [finished DEC 2017]. DEC 28th 2017 CT- Increase in peritoneal deposits; increasing Ca 125 [60s];  # Jan 2018- carbo-Taxol-Avastin x6 cycles; Aug 2018- CR; Aug 2018- start Avastin Maintenance; MARCH 2019- PROGRESSION- STOP avastin.   # MID April 2019- Zejula [300 mg/day]; 4 days- later STOPPED sec to Chest pain   # May 2019- TAKASUBO ? [s/p cardiac cath- Dr.Kowalski]  # G-1-2 hand foot syndrome-   # LEFT BREAST CA s/p Lumpec & RT s/p TAM   # Afib [cardiology]; JUNE 2017- MUGA 62%  Jan 2018- MOLECULAR TESTING- ATM DELETERIOUS HETEROZYGOUS mutation; TUMOR BRCA- NEG; Myriad genomic instability- NEGATIVE;   # NGS/Omniseq- BRCA-2 copy loss/ * [april2019]; NEG- BRCA Mutations/N-TRK/MSI-STABLE.  ---------------------------------------------------------------------    DIAGNOSIS: MAY 2017; RECURRENT OVARIAN CA-high-grade serous/platinum refractory  STAGE: IV        ;GOALS: PALLIATIVE  CURRENT/MOST  RECENT THERAPY: June 2nd week  Lynparza      Malignant neoplasm of ovary ([place])    Ovarian cancer, unspecified laterality ([place])      INTERVAL HISTORY:  Jamie Conner 77 y.o.  female pleasant patient above history of metastatic high-grade serous platinum refractory ovarian cancer currently on [name] is here for follow-up.  Patient has intermittent rectal discomfort for which she was evaluated by GI.  Patient has intermittent nausea for which she takes Zofran.  She states that Zofran makes her constipation.  She is using MiraLAX.  For the last 1 week patient is currently on Lynparza 300 mg twice a day [standard dose]; patient denies any significant nausea vomiting.    No swelling in the legs.   Review of Systems  Constitutional: Positive for malaise/fatigue. Negative for chills, diaphoresis, fever and weight loss.  HENT: Negative for nosebleeds and sore throat.   Eyes: Negative for double vision.  Respiratory: Negative for cough, hemoptysis, sputum production, shortness of breath and wheezing.   Cardiovascular: Negative for chest pain, palpitations, orthopnea and leg swelling.  Gastrointestinal: Positive for nausea. Negative for abdominal pain (Rectal discomfort; no blood in stools.), blood in stool, constipation, diarrhea, heartburn, melena and vomiting.  Genitourinary: Negative for dysuria, frequency and urgency.  Musculoskeletal: Negative for back pain and joint pain.  Skin: Negative.  Negative for itching and rash.  Neurological: Negative for dizziness, tingling, focal weakness, weakness and headaches.  Endo/Heme/Allergies: Does not bruise/bleed easily.  Psychiatric/Behavioral: Negative for depression. The patient is not nervous/anxious and does not have insomnia.       PAST MEDICAL HISTORY :  Past Medical History:  Diagnosis Date  . Atrial fibrillation ([place])   . Breast cancer ([place]) 1998  .  Cancer of breast ([place]) 1998   Left/radiation  . CINV (chemotherapy-induced  nausea and vomiting)   . GERD (gastroesophageal reflux disease)   . Hyperlipidemia   . Hypothyroidism   . Mitral insufficiency   . Ovarian cancer ([place])    s/p BSO optimal tumor debulking May 2016/chemo  . Personal history of chemotherapy since 2016   ovarian cancer  . Personal history of radiation therapy 1998  . PVC (premature ventricular contraction)     PAST SURGICAL HISTORY :   Past Surgical History:  Procedure Laterality Date  . ABDOMINAL HYSTERECTOMY    . BILATERAL SALPINGOOPHORECTOMY  May 2016   with optimal tumor debulking   . BREAST LUMPECTOMY Left 1998  . BREAST SURGERY     Left  . CESAREAN SECTION     x2  . CHOLECYSTECTOMY    . COLONOSCOPY  2006  . DEBULKING N/A 12/22/2014   Procedure: DEBULKING;  Surgeon: Gillis Ends, MD;  Location: [facility] ORS;  Service: Gynecology;  Laterality: N/A;  . LAPAROTOMY N/A 12/22/2014   Procedure: EXPLORATORY LAPAROTOMY;  Surgeon: Robert Bellow, MD;  Location: [facility] ORS;  Service: General;  Laterality: N/A;  . LAPAROTOMY WITH STAGING N/A 12/22/2014   Procedure: LAPAROTOMY WITH STAGING;  Surgeon: Gillis Ends, MD;  Location: [facility] OR[facility];  Service: Gynecology;  Laterality: N/A;  . LEFT HEART CATH AND CORONARY ANGIOGRAPHY N/A 12/10/2017   Procedure: LEFT HEART CATH AND CORONARY ANGIOGRAPHY;  Surgeon: Yolonda Kida, MD;  Location: [facility];  Service: Cardiovascular;  Laterality: N/A;  . PERIPHERAL VASCULAR CATHETERIZATION Left 12/22/2014   Procedure: PORTA CATH INSERTION;  Surgeon: Robert Bellow, MD;  Location: [facility];  Service: General;  Laterality: Left;  . PORTACATH PLACEMENT Right 12/22/2014   Procedure: INSERTION PORT-A-CATH;  Surgeon: Robert Bellow, MD;  Location: [facility];  Service: General;  Laterality: Right;  . SALPINGOOPHORECTOMY Bilateral 12/22/2014   Procedure: SALPINGO OOPHORECTOMY;  Surgeon: Gillis Ends, MD;  Location: [facility];  Service: Gynecology;  Laterality: Bilateral;  . UPPER  GI ENDOSCOPY  09/25/04   hiatus hernia, schatzki ring and a single gastric polyp    FAMILY HISTORY :   Family History  Problem Relation Age of Onset  . Cancer Mother        breast, throat, and stomach  . Breast cancer Mother   . Heart disease Father   . Pulmonary embolism Sister   . Cancer Brother 60       angiosarcoma of the chest; carcinoid small intestinal tumor  . Hypertension Brother   . Stroke Brother   . Cancer Maternal Aunt        breast cancer  . Cancer Maternal Grandfather 69       pancreatic    SOCIAL HISTORY:   Social History   Tobacco Use  . Smoking status: Never Smoker  . Smokeless tobacco: Never Used  Substance Use Topics  . Alcohol use: No  . Drug use: No    ALLERGIES:  is allergic to carafate [sucralfate] and sulfa antibiotics.  MEDICATIONS:  Current Outpatient Medications  Medication Sig Dispense Refill  . acetaminophen (TYLENOL) 500 MG tablet Take 500 mg by mouth every 6 (six) hours as needed.    Marland Kitchen aspirin EC 325 MG tablet Take 1 tablet (325 mg total) by mouth daily. 100 tablet 3  . Cholecalciferol (VITAMIN D) 2000 units tablet Take 1 tablet (2,000 Units total) by mouth daily. 30 tablet 12  . fluticasone (FLONASE) 50 MCG/ACT  nasal spray Place 1 spray into both nostrils daily.    Marland Kitchen levothyroxine (SYNTHROID, LEVOTHROID) 50 MCG tablet TAKE ONE TABLET ON AN EMPTY STOMACH WITHA GLASS OF WATER AT LEAST 30 TO 60 MINUTES BEFORE BREAKFAST 30 tablet 12  . lidocaine-prilocaine (EMLA) cream Apply to port area 30-45 mins prior to chemo. 30 g 5  . loratadine (CLARITIN) 10 MG tablet Take 10 mg by mouth daily.    Marland Kitchen LORazepam (ATIVAN) 0.5 MG tablet Take 0.5 tablets (0.25 mg total) by mouth at bedtime. 30 tablet 4  . Lysine 500 MG TABS Take 1 tablet by mouth daily as needed.     . metoprolol succinate (TOPROL-XL) 25 MG 24 hr tablet Take 1 tablet by mouth daily.    . Multiple Vitamin (MULTIVITAMIN) capsule Take 1 capsule by mouth daily.    Marland Kitchen olaparib (LYNPARZA) 150 MG  tablet Take 2 tablets (300 mg total) by mouth 2 (two) times daily. 120 tablet 3  . ondansetron (ZOFRAN) 4 MG tablet One pill 30-60 mins prior to taking zejula to prevent nausea/vomitting. 45 tablet 3  . pantoprazole (PROTONIX) 40 MG tablet Take 1 tablet (40 mg total) by mouth daily. 30 tablet 1  . traMADol (ULTRAM) 50 MG tablet Take 1 tablet (50 mg total) by mouth every 6 (six) hours as needed. 30 tablet 0  . alum & mag hydroxide-simeth (MAALOX/MYLANTA) 200-200-20 MG/5ML suspension Take 15 mLs by mouth every 6 (six) hours as needed for indigestion or heartburn. (Patient not taking: Reported on 02/14/2018) 355 mL 0  . Docosanol (ABREVA EX) Apply 1 application topically as needed. Fever blisters    . furosemide (LASIX) 20 MG tablet Take 0.5 tablets (10 mg total) by mouth daily. (Patient not taking: Reported on 02/14/2018) 30 tablet 0  . magic mouthwash w/lidocaine SOLN Take 5 mLs by mouth every 4 (four) hours as needed.  1   No current facility-administered medications for this visit.     PHYSICAL EXAMINATION: ECOG PERFORMANCE STATUS: 1 - Symptomatic but completely ambulatory  BP 124/80 (BP Location: Left Arm, Patient Position: Sitting)   Pulse 68   Temp 97.8 F (36.6 C) (Tympanic)   Resp 20   Ht '5\' 4"'$  (1.626 m)   Wt 190 lb (86.2 kg)   BMI 32.61 kg/m   Filed Weights   02/27/18 1006  Weight: 190 lb (86.2 kg)    GENERAL: Well-nourished well-developed; Alert, no distress and comfortable.  Accompanied by family.  EYES: no pallor or icterus OROPHARYNX: no thrush or ulceration; NECK: supple; no lymph nodes felt. LYMPH:  no palpable lymphadenopathy in the axillary or inguinal regions LUNGS: Decreased breath sounds auscultation bilaterally. No wheeze or crackles HEART/CVS: regular rate & rhythm and no murmurs; No lower extremity edema ABDOMEN:abdomen soft, non-tender and normal bowel sounds. No hepatomegaly or splenomegaly.  Musculoskeletal:no cyanosis of digits and no clubbing  PSYCH:  alert & oriented x 3 with fluent speech NEURO: no focal motor/sensory deficits SKIN:  no rashes or significant lesions    LABORATORY DATA:  I have reviewed the data as listed    Component Value Date/Time   NA 139 02/27/2018 0937   NA 140 12/14/2014 1037   K 4.1 02/27/2018 0937   K 4.4 12/14/2014 1037   CL 108 02/27/2018 0937   CL 106 12/14/2014 1037   CO2 23 02/27/2018 0937   CO2 26 12/14/2014 1037   GLUCOSE 101 (H) 02/27/2018 0937   GLUCOSE 87 12/14/2014 1037   BUN 16 02/27/2018 4010  BUN 12 12/14/2014 1037   CREATININE 1.18 (H) 02/27/2018 0937   CREATININE 0.97 12/14/2014 1037   CALCIUM 9.5 02/27/2018 0937   CALCIUM 9.2 12/14/2014 1037   PROT 6.5 02/27/2018 0937   PROT 7.3 12/14/2014 1037   ALBUMIN 3.5 02/27/2018 0937   ALBUMIN 3.8 12/14/2014 1037   AST 24 02/27/2018 0937   AST 20 12/14/2014 1037   ALT 13 02/27/2018 0937   ALT 11 (L) 12/14/2014 1037   ALKPHOS 66 02/27/2018 0937   ALKPHOS 138 (H) 12/14/2014 1037   BILITOT 0.7 02/27/2018 0937   BILITOT 0.5 12/14/2014 1037   GFRNONAA 44 (L) 02/27/2018 0937   GFRNONAA 59 (L) 12/14/2014 1037   GFRAA 51 (L) 02/27/2018 0937   GFRAA >60 12/14/2014 1037    No results found for: SPEP, UPEP  Lab Results  Component Value Date   WBC 6.0 02/27/2018   NEUTROABS 2.3 02/27/2018   HGB 13.2 02/27/2018   HCT 38.3 02/27/2018   MCV 96.9 02/27/2018   PLT 225 02/27/2018      Chemistry      Component Value Date/Time   NA 139 02/27/2018 0937   NA 140 12/14/2014 1037   K 4.1 02/27/2018 0937   K 4.4 12/14/2014 1037   CL 108 02/27/2018 0937   CL 106 12/14/2014 1037   CO2 23 02/27/2018 0937   CO2 26 12/14/2014 1037   BUN 16 02/27/2018 0937   BUN 12 12/14/2014 1037   CREATININE 1.18 (H) 02/27/2018 0937   CREATININE 0.97 12/14/2014 1037   GLU 84 03/16/2014      Component Value Date/Time   CALCIUM 9.5 02/27/2018 0937   CALCIUM 9.2 12/14/2014 1037   ALKPHOS 66 02/27/2018 0937   ALKPHOS 138 (H) 12/14/2014 1037   AST 24  02/27/2018 0937   AST 20 12/14/2014 1037   ALT 13 02/27/2018 0937   ALT 11 (L) 12/14/2014 1037   BILITOT 0.7 02/27/2018 0937   BILITOT 0.5 12/14/2014 1037       RADIOGRAPHIC STUDIES: I have personally reviewed the radiological images as listed and agreed with the findings in the report. No results found.   ASSESSMENT & PLAN:  Ovarian cancer, unspecified laterality ([place]) # RECURRENT PLATINUM REFRACTORY HIGH GRADE SEROUS ovarian cancer- march 8th CT/PET- Progression; with possible cecal implants; increasing vaginal cuff thickness.   # Currently on Lynparza 300 mg twice a day [full dose July 5th 2019]; clinically stable; but CEA- trending up- 132.  Continue current dose of Lynparza; tolerating well.  Discussed that if progression of disease is confirmed on subsequent imaging-then her option would be chemotherapy.  She understands the disease is incurable.  #Rectal discomfort; recent evaluation with GI Dr. Everardo Pacific plan for colonoscopy.  If worsening then I would recommend CT of the abdomen pelvis for further evaluation of the disease.  #Nausea-secondary to [place]  Continue Zofran as needed.  # follow up in 3 weeks/labs/Ca-125/MD.    Orders Placed This Encounter  Procedures  . Comprehensive metabolic panel    Standing Status:   Future    Standing Expiration Date:   02/28/2019  . CBC with Differential    Standing Status:   Future    Standing Expiration Date:   02/28/2019  . CA 125    Standing Status:   Future    Standing Expiration Date:   02/28/2019   All questions were answered. The patient knows to call the clinic with any problems, questions or concerns.      Lenetta Quaker R  Rogue Bussing, MD 02/27/2018 6:47 PM

## 2018-02-27 NOTE — Assessment & Plan Note (Addendum)
#   RECURRENT PLATINUM REFRACTORY HIGH GRADE SEROUS ovarian cancer- march 8th CT/PET- Progression; with possible cecal implants; increasing vaginal cuff thickness.   # Currently on Lynparza 300 mg twice a day [full dose July 5th 2019]; clinically stable; but CEA- trending up- 132.  Continue current dose of Lynparza; tolerating well.  Discussed that if progression of disease is confirmed on subsequent imaging-then her option would be chemotherapy.  She understands the disease is incurable.  #Rectal discomfort; recent evaluation with GI Dr. Everardo Pacific plan for colonoscopy.  If worsening then I would recommend CT of the abdomen pelvis for further evaluation of the disease.  #Nausea-secondary to [place]  Continue Zofran as needed.  # follow up in 3 weeks/labs/Ca-125/MD.

## 2018-03-03 ENCOUNTER — Inpatient Hospital Stay (HOSPITAL_BASED_OUTPATIENT_CLINIC_OR_DEPARTMENT_OTHER): Payer: PPO | Admitting: Oncology

## 2018-03-03 ENCOUNTER — Telehealth: Payer: Self-pay | Admitting: *Deleted

## 2018-03-03 VITALS — Temp 98.0°F | Wt 190.0 lb

## 2018-03-03 DIAGNOSIS — Z9221 Personal history of antineoplastic chemotherapy: Secondary | ICD-10-CM

## 2018-03-03 DIAGNOSIS — K219 Gastro-esophageal reflux disease without esophagitis: Secondary | ICD-10-CM

## 2018-03-03 DIAGNOSIS — K5900 Constipation, unspecified: Secondary | ICD-10-CM | POA: Diagnosis not present

## 2018-03-03 DIAGNOSIS — I4891 Unspecified atrial fibrillation: Secondary | ICD-10-CM | POA: Diagnosis not present

## 2018-03-03 DIAGNOSIS — R5381 Other malaise: Secondary | ICD-10-CM

## 2018-03-03 DIAGNOSIS — R590 Localized enlarged lymph nodes: Secondary | ICD-10-CM

## 2018-03-03 DIAGNOSIS — R5383 Other fatigue: Secondary | ICD-10-CM

## 2018-03-03 DIAGNOSIS — C569 Malignant neoplasm of unspecified ovary: Secondary | ICD-10-CM | POA: Diagnosis not present

## 2018-03-03 DIAGNOSIS — K6289 Other specified diseases of anus and rectum: Secondary | ICD-10-CM | POA: Diagnosis not present

## 2018-03-03 DIAGNOSIS — E039 Hypothyroidism, unspecified: Secondary | ICD-10-CM

## 2018-03-03 DIAGNOSIS — R599 Enlarged lymph nodes, unspecified: Secondary | ICD-10-CM

## 2018-03-03 DIAGNOSIS — Z79899 Other long term (current) drug therapy: Secondary | ICD-10-CM

## 2018-03-03 DIAGNOSIS — R6883 Chills (without fever): Secondary | ICD-10-CM | POA: Diagnosis not present

## 2018-03-03 DIAGNOSIS — E785 Hyperlipidemia, unspecified: Secondary | ICD-10-CM

## 2018-03-03 DIAGNOSIS — R110 Nausea: Secondary | ICD-10-CM | POA: Diagnosis not present

## 2018-03-03 DIAGNOSIS — Z7982 Long term (current) use of aspirin: Secondary | ICD-10-CM

## 2018-03-03 DIAGNOSIS — Z90722 Acquired absence of ovaries, bilateral: Secondary | ICD-10-CM | POA: Diagnosis not present

## 2018-03-03 DIAGNOSIS — Z792 Long term (current) use of antibiotics: Secondary | ICD-10-CM | POA: Diagnosis not present

## 2018-03-03 DIAGNOSIS — R591 Generalized enlarged lymph nodes: Secondary | ICD-10-CM

## 2018-03-03 MED ORDER — AMOXICILLIN-POT CLAVULANATE 875-125 MG PO TABS: 1.0000 | ORAL_TABLET | Freq: Two times a day (BID) | ORAL | 0 refills | Status: DC

## 2018-03-03 NOTE — Progress Notes (Signed)
Patient here for evaluation of left sided jaw pain and selling, which has progressively gotten worse. She states that it feels like something is in her throat when she swallows. She noticed a small lump there about a month ago, but now it is visibly swollen and has become painful.

## 2018-03-03 NOTE — Telephone Encounter (Signed)
Called to report that she has a sudden onset of a swollen jaw. Patient was given a 10 AM appointment with Symptom Management clinic.

## 2018-03-03 NOTE — Progress Notes (Signed)
Symptom Management Consult note [facility]  [phone] [phone]  Patient Care Team: Jerrol Banana., MD as PCP - General (Family Medicine) Gillis Ends, MD as Referring Physician (Obstetrics and Gynecology) Cammie Sickle, MD as Consulting Physician (Oncology) Corey Skains, MD as Consulting Physician (Cardiology) Marval Regal, NP as Nurse Practitioner (Nurse Practitioner) Manya Silvas, MD as Consulting Physician (Gastroenterology)   Name of the patient: Jamie Conner  [id number]  1941-02-19   Date of visit: 03/03/18  Diagnosis-malignant neoplasm of ovary  Chief complaint/ Reason for visit- Jaw swelling and pain  Heme/Onc history: Patient was last seen by primary medical oncologist Dr. Rogue Bussing on 02/27/2018 for follow-up.  Patient recently started Lynparza 300 mg twice daily.  She denied any significant nausea or vomiting.  Complained of intermittent rectal discomfort which she has been evaluated by GI.  GI did not recommend colonoscopy at this time but if she continues to experience pain a CT of the abdomen/pelvis may be warranted for evaluation of disease.  Intermittent nausea and takes Zofran.  Uses MiraLAX for constipation.  Reviewed labs.  CEA trending up and was 132.  Discussed that if she continues to have progression of disease at next imaging her next option would be chemotherapy.  Patient understood.  Oncology History   # April- MAY 2016- HIGH GRADE SEROUS OVARIAN CANCER STAGE IIIC; CA-125 +2300+;  s/p OPTIMAL DEBULKING SURGERY   # MAY 2016[name] [finished in Sep 19th 2017]  # MAY CT 2017- RECURRENT/Peritoneal carcinomatosis/pelvic implant ~1.2cm/Ca-125-34;   # July 3rd 2017- CARBO-DOXIL q 4W [with neulasta]; CT May 11 2016- slight increase peritoneal / stable nodules. Cont chemo [finished DEC 2017]. DEC 28th 2017 CT- Increase in peritoneal deposits; increasing Ca 125  [60s];  # Jan 2018- carbo-Taxol-Avastin x6 cycles; Aug 2018- CR; Aug 2018- start Avastin Maintenance; MARCH 2019- PROGRESSION- STOP avastin.   # MID April 2019- Zejula [300 mg/day]; 4 days- later STOPPED sec to Chest pain   # May 2019- TAKASUBO ? [s/p cardiac cath- Dr.Kowalski]  # G-1-2 hand foot syndrome-   # LEFT BREAST CA s/p Lumpec & RT s/p TAM   # Afib [cardiology]; JUNE 2017- MUGA 62%  Jan 2018- MOLECULAR TESTING- ATM DELETERIOUS HETEROZYGOUS mutation; TUMOR BRCA- NEG; Myriad genomic instability- NEGATIVE;   # NGS/Omniseq- BRCA-2 copy loss/ * [april2019]; NEG- BRCA Mutations/N-TRK/MSI-STABLE.  ---------------------------------------------------------------------    DIAGNOSIS: MAY 2017; RECURRENT OVARIAN CA-high-grade serous/platinum refractory  STAGE: IV        ;GOALS: PALLIATIVE  CURRENT/MOST RECENT THERAPY: June 2nd week  Lynparza      Malignant neoplasm of ovary ([place])    Ovarian cancer, unspecified laterality ([place])    Interval history-  Jamie Conner presents with a left facial/neck mass.  The patient reports that the mass has been present for 4 days.  The onset of the mass was gradual.  Associated symptoms were positive for pain, difficulty opening mouth, difficulty swallowing.  There has not been a history of pain, fatigue, fevers, night sweats.  The recent exposure history has been minimal. No sick contacts.  Prior antibiotics have included no recent courses.  ECOG FS:1 - Symptomatic but completely ambulatory  Review of systems- Review of Systems  Constitutional: Negative.  Negative for chills, fever, malaise/fatigue and weight loss.  HENT: Negative for congestion, ear pain, nosebleeds, sinus pain and sore throat.        Left sided jaw pain  Eyes: Negative.  Negative  for blurred vision and double vision.  Respiratory: Negative.  Negative for cough, sputum production and shortness of breath.   Cardiovascular: Negative.  Negative for chest pain,  palpitations and leg swelling.  Gastrointestinal: Negative.  Negative for abdominal pain, constipation, diarrhea, nausea and vomiting.  Genitourinary: Negative for dysuria, frequency and urgency.  Musculoskeletal: Negative for back pain and falls.  Skin: Negative.  Negative for rash.  Neurological: Negative.  Negative for weakness and headaches.  Endo/Heme/Allergies: Negative.  Does not bruise/bleed easily.  Psychiatric/Behavioral: Negative.  Negative for depression. The patient is not nervous/anxious and does not have insomnia.      Current treatment- Lynparza 300 mg twice a day.   Allergies  Allergen Reactions  . Carafate [Sucralfate] Rash  . Sulfa Antibiotics Rash     Past Medical History:  Diagnosis Date  . Atrial fibrillation ([place])   . Breast cancer ([place]) 1998  . Cancer of breast ([place]) 1998   Left/radiation  . CINV (chemotherapy-induced nausea and vomiting)   . GERD (gastroesophageal reflux disease)   . Hyperlipidemia   . Hypothyroidism   . Mitral insufficiency   . Ovarian cancer ([place])    s/p BSO optimal tumor debulking May 2016/chemo  . Personal history of chemotherapy since 2016   ovarian cancer  . Personal history of radiation therapy 1998  . PVC (premature ventricular contraction)      Past Surgical History:  Procedure Laterality Date  . ABDOMINAL HYSTERECTOMY    . BILATERAL SALPINGOOPHORECTOMY  May 2016   with optimal tumor debulking   . BREAST LUMPECTOMY Left 1998  . BREAST SURGERY     Left  . CESAREAN SECTION     x2  . CHOLECYSTECTOMY    . COLONOSCOPY  2006  . DEBULKING N/A 12/22/2014   Procedure: DEBULKING;  Surgeon: Gillis Ends, MD;  Location: [facility];  Service: Gynecology;  Laterality: N/A;  . LAPAROTOMY N/A 12/22/2014   Procedure: EXPLORATORY LAPAROTOMY;  Surgeon: Robert Bellow, MD;  Location: [facility];  Service: General;  Laterality: N/A;  . LAPAROTOMY WITH STAGING N/A 12/22/2014   Procedure: LAPAROTOMY WITH STAGING;  Surgeon: Gillis Ends, MD;  Location: [facility];  Service: Gynecology;  Laterality: N/A;  . LEFT HEART CATH AND CORONARY ANGIOGRAPHY N/A 12/10/2017   Procedure: LEFT HEART CATH AND CORONARY ANGIOGRAPHY;  Surgeon: Yolonda Kida, MD;  Location: [place] [facility];  Service: Cardiovascular;  Laterality: N/A;  . PERIPHERAL VASCULAR CATHETERIZATION Left 12/22/2014   Procedure: PORTA CATH INSERTION;  Surgeon: Robert Bellow, MD;  Location: [facility];  Service: General;  Laterality: Left;  . PORTACATH PLACEMENT Right 12/22/2014   Procedure: INSERTION PORT-A-CATH;  Surgeon: Robert Bellow, MD;  Location: [facility];  Service: General;  Laterality: Right;  . SALPINGOOPHORECTOMY Bilateral 12/22/2014   Procedure: SALPINGO OOPHORECTOMY;  Surgeon: Gillis Ends, MD;  Location: [facility];  Service: Gynecology;  Laterality: Bilateral;  . UPPER GI ENDOSCOPY  09/25/04   hiatus hernia, schatzki ring and a single gastric polyp    Social History   Socioeconomic History  . Marital status: Married    Spouse name: Not on file  . Number of children: 2  . Years of education: Not on file  . Highest education level: Bachelor's degree (e.g., BA, AB, BS)  Occupational History  . Occupation: retired  Scientific laboratory technician  . Financial resource strain: Not hard at all  . Food insecurity:    Worry: Never true    Inability: Never true  .  Transportation needs:    Medical: No    Non-medical: No  Tobacco Use  . Smoking status: Never Smoker  . Smokeless tobacco: Never Used  Substance and Sexual Activity  . Alcohol use: No  . Drug use: No  . Sexual activity: Not Currently  Lifestyle  . Physical activity:    Days per week: Not on file    Minutes per session: Not on file  . Stress: To some extent  Relationships  . Social connections:    Talks on phone: Not on file    Gets together: Not on file    Attends religious service: Not on file    Active member of club or organization: Not on file    Attends meetings of  clubs or organizations: Not on file    Relationship status: Not on file  . Intimate partner violence:    Fear of current or ex partner: Not on file    Emotionally abused: Not on file    Physically abused: Not on file    Forced sexual activity: Not on file  Other Topics Concern  . Not on file  Social History Narrative  . Not on file    Family History  Problem Relation Age of Onset  . Cancer Mother        breast, throat, and stomach  . Breast cancer Mother   . Heart disease Father   . Pulmonary embolism Sister   . Cancer Brother 60       angiosarcoma of the chest; carcinoid small intestinal tumor  . Hypertension Brother   . Stroke Brother   . Cancer Maternal Aunt        breast cancer  . Cancer Maternal Grandfather 20       pancreatic     Current Outpatient Medications:  .  acetaminophen (TYLENOL) 500 MG tablet, Take 500 mg by mouth every 6 (six) hours as needed., Disp: , Rfl:  .  aspirin EC 325 MG tablet, Take 1 tablet (325 mg total) by mouth daily., Disp: 100 tablet, Rfl: 3 .  Cholecalciferol (VITAMIN D) 2000 units tablet, Take 1 tablet (2,000 Units total) by mouth daily., Disp: 30 tablet, Rfl: 12 .  Docosanol (ABREVA EX), Apply 1 application topically as needed. Fever blisters, Disp: , Rfl:  .  fluticasone (FLONASE) 50 MCG/ACT nasal spray, Place 1 spray into both nostrils daily., Disp: , Rfl:  .  furosemide (LASIX) 20 MG tablet, Take 0.5 tablets (10 mg total) by mouth daily., Disp: 30 tablet, Rfl: 0 .  levothyroxine (SYNTHROID, LEVOTHROID) 50 MCG tablet, TAKE ONE TABLET ON AN EMPTY STOMACH WITHA GLASS OF WATER AT LEAST 30 TO 60 MINUTES BEFORE BREAKFAST, Disp: 30 tablet, Rfl: 12 .  lidocaine-prilocaine (EMLA) cream, Apply to port area 30-45 mins prior to chemo., Disp: 30 g, Rfl: 5 .  loratadine (CLARITIN) 10 MG tablet, Take 10 mg by mouth daily., Disp: , Rfl:  .  LORazepam (ATIVAN) 0.5 MG tablet, Take 0.5 tablets (0.25 mg total) by mouth at bedtime., Disp: 30 tablet, Rfl: 4 .   Lysine 500 MG TABS, Take 1 tablet by mouth daily as needed. , Disp: , Rfl:  .  magic mouthwash w/lidocaine SOLN, Take 5 mLs by mouth every 4 (four) hours as needed., Disp: , Rfl: 1 .  metoprolol succinate (TOPROL-XL) 25 MG 24 hr tablet, Take 1 tablet by mouth daily., Disp: , Rfl:  .  Multiple Vitamin (MULTIVITAMIN) capsule, Take 1 capsule by mouth daily., Disp: , Rfl:  .  olaparib (LYNPARZA) 150 MG tablet, Take 2 tablets (300 mg total) by mouth 2 (two) times daily., Disp: 120 tablet, Rfl: 3 .  ondansetron (ZOFRAN) 4 MG tablet, One pill 30-60 mins prior to taking zejula to prevent nausea/vomitting., Disp: 45 tablet, Rfl: 3 .  pantoprazole (PROTONIX) 40 MG tablet, Take 1 tablet (40 mg total) by mouth daily., Disp: 30 tablet, Rfl: 1 .  traMADol (ULTRAM) 50 MG tablet, Take 1 tablet (50 mg total) by mouth every 6 (six) hours as needed., Disp: 30 tablet, Rfl: 0 .  alum & mag hydroxide-simeth (MAALOX/MYLANTA) 614-431-54 MG/5ML suspension, Take 15 mLs by mouth every 6 (six) hours as needed for indigestion or heartburn. (Patient not taking: Reported on 02/14/2018), Disp: 355 mL, Rfl: 0 .  amoxicillin-clavulanate (AUGMENTIN) 875-125 MG tablet, Take 1 tablet by mouth 2 (two) times daily., Disp: 20 tablet, Rfl: 0  Physical exam:  Vitals:   03/03/18 1001  Temp: 98 F (36.7 C)  TempSrc: Tympanic  Weight: 190 lb (86.2 kg)   Physical Exam  Constitutional: She is oriented to person, place, and time. Vital signs are normal. She appears well-developed and well-nourished.  HENT:  Head: Normocephalic and atraumatic.  Eyes: Pupils are equal, round, and reactive to light.  Neck: Normal range of motion.  Cardiovascular: Normal rate, regular rhythm and normal heart sounds.  No murmur heard. Pulmonary/Chest: Effort normal and breath sounds normal. She has no wheezes.  Abdominal: Soft. Normal appearance and bowel sounds are normal. She exhibits no distension. There is no tenderness.  Musculoskeletal: Normal range  of motion. She exhibits no edema.  Lymphadenopathy:       Head (left side): Submandibular and tonsillar adenopathy present.  Very tender lymphnodes  Neurological: She is alert and oriented to person, place, and time.  Skin: Skin is warm and dry. No rash noted.  Psychiatric: Judgment normal.     CMP Latest Ref Rng & Units 02/27/2018  Glucose 70 - 99 mg/dL 101(H)  BUN 8 - 23 mg/dL 16  Creatinine 0.44 - 1.00 mg/dL 1.18(H)  Sodium 135 - 145 mmol/L 139  Potassium 3.5 - 5.1 mmol/L 4.1  Chloride 98 - 111 mmol/L 108  CO2 22 - 32 mmol/L 23  Calcium 8.9 - 10.3 mg/dL 9.5  Total Protein 6.5 - 8.1 g/dL 6.5  Total Bilirubin 0.3 - 1.2 mg/dL 0.7  Alkaline Phos 38 - 126 U/L 66  AST 15 - 41 U/L 24  ALT 0 - 44 U/L 13   CBC Latest Ref Rng & Units 02/27/2018  WBC 3.6 - 11.0 K/uL 6.0  Hemoglobin 12.0 - 16.0 g/dL 13.2  Hematocrit 35.0 - 47.0 % 38.3  Platelets 150 - 440 K/uL 225    No images are attached to the encounter.  No results found.   Assessment and plan- Patient is a 77 y.o. female who presents for left-sided jaw pain.  1.  Ovarian cancer: Last seen by Dr. Rogue Bussing on 02/27/2017 to assess toleration of full dose of Lynparza 300 mg twice daily.  Appeared to be tolerating well.  CBC was unremarkable.  She was scheduled to return to clinic on March 20, 2018 with further imaging, lab work and assessment.  Most recent CEA was elevated at 125 concerning for progression of disease.  Per Dr. Aletha Halim last note, he recommended chemotherapy should she progress on current therapy.  2.  Submandibular/tonsillar lymphadenopathy: She denies sore throat, cough, chest congestion or any signs of infection.  States she has not had a fever but admits  to "chills".  Denies any sick contacts.  Consult with Dr. Rogue Bussing he recommends antibiotics.  If symptoms are unresolved in about a week, will get further imaging and bring her in sooner to be seen. RX Augmentin 875-125 mg twice daily.  Patient in agreement  with plan.    Visit Diagnosis 1. Ovarian cancer, unspecified laterality ([place])   2. Adenopathy     Patient expressed understanding and was in agreement with this plan. She also understands that She can call clinic at any time with any questions, concerns, or complaints.   Greater than 50% was spent in counseling and coordination of care with this patient including but not limited to discussion of the relevant topics above (See A&P) including, but not limited to diagnosis and management of acute and chronic medical conditions.    Faythe Casa, AGNP-C [facility]- [phone] Pager- [phone] 03/03/2018 12:23 PM

## 2018-03-20 ENCOUNTER — Encounter: Payer: Self-pay | Admitting: Internal Medicine

## 2018-03-20 ENCOUNTER — Other Ambulatory Visit: Payer: Self-pay

## 2018-03-20 ENCOUNTER — Inpatient Hospital Stay (HOSPITAL_BASED_OUTPATIENT_CLINIC_OR_DEPARTMENT_OTHER): Payer: PPO | Admitting: Internal Medicine

## 2018-03-20 ENCOUNTER — Inpatient Hospital Stay: Payer: PPO | Attending: Internal Medicine

## 2018-03-20 VITALS — BP 123/76 | HR 69 | Temp 97.2°F | Resp 20 | Ht 64.0 in | Wt 190.0 lb

## 2018-03-20 DIAGNOSIS — C561 Malignant neoplasm of right ovary: Secondary | ICD-10-CM | POA: Diagnosis not present

## 2018-03-20 DIAGNOSIS — Z90722 Acquired absence of ovaries, bilateral: Secondary | ICD-10-CM | POA: Insufficient documentation

## 2018-03-20 DIAGNOSIS — C569 Malignant neoplasm of unspecified ovary: Secondary | ICD-10-CM

## 2018-03-20 DIAGNOSIS — Z90710 Acquired absence of both cervix and uterus: Secondary | ICD-10-CM | POA: Diagnosis not present

## 2018-03-20 DIAGNOSIS — R0989 Other specified symptoms and signs involving the circulatory and respiratory systems: Secondary | ICD-10-CM | POA: Insufficient documentation

## 2018-03-20 DIAGNOSIS — Z79899 Other long term (current) drug therapy: Secondary | ICD-10-CM

## 2018-03-20 DIAGNOSIS — R110 Nausea: Secondary | ICD-10-CM | POA: Diagnosis not present

## 2018-03-20 DIAGNOSIS — Z5111 Encounter for antineoplastic chemotherapy: Secondary | ICD-10-CM | POA: Insufficient documentation

## 2018-03-20 DIAGNOSIS — R591 Generalized enlarged lymph nodes: Secondary | ICD-10-CM | POA: Diagnosis not present

## 2018-03-20 DIAGNOSIS — J0100 Acute maxillary sinusitis, unspecified: Secondary | ICD-10-CM | POA: Diagnosis not present

## 2018-03-20 DIAGNOSIS — K5900 Constipation, unspecified: Secondary | ICD-10-CM | POA: Insufficient documentation

## 2018-03-20 LAB — CBC WITH DIFFERENTIAL/PLATELET
BASOS ABS: 0 10*3/uL (ref 0–0.1)
Basophils Relative: 0 %
EOS ABS: 0.1 10*3/uL (ref 0–0.7)
Eosinophils Relative: 2 %
HCT: 37.3 % (ref 35.0–47.0)
HEMOGLOBIN: 12.5 g/dL (ref 12.0–16.0)
LYMPHS ABS: 2.4 10*3/uL (ref 1.0–3.6)
Lymphocytes Relative: 35 %
MCH: 33.1 pg (ref 26.0–34.0)
MCHC: 33.4 g/dL (ref 32.0–36.0)
MCV: 99 fL (ref 80.0–100.0)
Monocytes Absolute: 0.6 10*3/uL (ref 0.2–0.9)
Monocytes Relative: 9 %
NEUTROS PCT: 54 %
Neutro Abs: 3.6 10*3/uL (ref 1.4–6.5)
Platelets: 248 10*3/uL (ref 150–440)
RBC: 3.76 MIL/uL — AB (ref 3.80–5.20)
RDW: 17.5 % — ABNORMAL HIGH (ref 11.5–14.5)
WBC: 6.7 10*3/uL (ref 3.6–11.0)

## 2018-03-20 LAB — URINALYSIS, COMPLETE (UACMP) WITH MICROSCOPIC
BACTERIA UA: NONE SEEN
Bilirubin Urine: NEGATIVE
Glucose, UA: NEGATIVE mg/dL
Hgb urine dipstick: NEGATIVE
Ketones, ur: NEGATIVE mg/dL
Leukocytes, UA: NEGATIVE
Nitrite: NEGATIVE
Protein, ur: NEGATIVE mg/dL
SPECIFIC GRAVITY, URINE: 1.01 (ref 1.005–1.030)
pH: 5 (ref 5.0–8.0)

## 2018-03-20 LAB — COMPREHENSIVE METABOLIC PANEL
ALBUMIN: 3.4 g/dL — AB (ref 3.5–5.0)
ALK PHOS: 64 U/L (ref 38–126)
ALT: 12 U/L (ref 0–44)
AST: 22 U/L (ref 15–41)
Anion gap: 9 (ref 5–15)
BUN: 14 mg/dL (ref 8–23)
CHLORIDE: 108 mmol/L (ref 98–111)
CO2: 23 mmol/L (ref 22–32)
CREATININE: 1.17 mg/dL — AB (ref 0.44–1.00)
Calcium: 9.3 mg/dL (ref 8.9–10.3)
GFR calc non Af Amer: 45 mL/min — ABNORMAL LOW (ref >60)
GFR, EST AFRICAN AMERICAN: 52 mL/min — AB (ref >60)
GLUCOSE: 104 mg/dL — AB (ref 70–99)
Potassium: 4 mmol/L (ref 3.5–5.1)
SODIUM: 140 mmol/L (ref 135–145)
Total Bilirubin: 0.5 mg/dL (ref 0.3–1.2)
Total Protein: 6.6 g/dL (ref 6.5–8.1)

## 2018-03-20 NOTE — Progress Notes (Signed)
[place] OFFICE PROGRESS NOTE  Patient Care Team: Jamie Conner., MD as PCP - General (Family Medicine) Jamie Ends, MD as Referring Physician (Obstetrics and Gynecology) Jamie Sickle, MD as Consulting Physician (Oncology) Jamie Skains, MD as Consulting Physician (Cardiology) Jamie Regal, NP as Nurse Practitioner (Nurse Practitioner) Jamie Silvas, MD as Consulting Physician (Gastroenterology)  Cancer Staging Malignant neoplasm of ovary [facility]) Staging form: Ovary, AJCC 7th Edition - Clinical: Stage IIIC (T3c, N0, M0) - Unsigned    Oncology History   # April- MAY 2016- HIGH GRADE SEROUS OVARIAN CANCER STAGE IIIC; CA-125 +2300+;  s/p OPTIMAL DEBULKING SURGERY   # MAY 2016[name] DD [finished in Sep 19th 2017]  # MAY CT 2017- RECURRENT/Peritoneal carcinomatosis/pelvic implant ~1.2cm/Ca-125-34;   # July 3rd 2017- CARBO-DOXIL q 4W [with neulasta]; CT May 11 2016- slight increase peritoneal / stable nodules. Cont chemo [finished DEC 2017]. DEC 28th 2017 CT- Increase in peritoneal deposits; increasing Ca 125 [60s];  # Jan 2018- carbo-Taxol-Avastin x6 cycles; Aug 2018- CR; Aug 2018- start Avastin Maintenance; MARCH 2019- PROGRESSION- STOP avastin.   # MID April 2019- Zejula [300 mg/day]; 4 days- later STOPPED sec to Chest pain;  # June 1st week- Lynparza    # May 2019- TAKASUBO ? [s/p cardiac cath- Jamie Conner]  # G-1-2 hand foot syndrome-   # LEFT BREAST CA s/p Lumpec & RT s/p TAM   # Afib [cardiology]; JUNE 2017- MUGA 62%  Jan 2018- MOLECULAR TESTING- ATM DELETERIOUS HETEROZYGOUS mutation; TUMOR BRCA- NEG; Myriad genomic instability- NEGATIVE;   # NGS/Omniseq- BRCA-2 copy loss/ * [april2019]; NEG- BRCA Mutations/N-TRK/MSI-STABLE.  ---------------------------------------------------------------------    DIAGNOSIS: MAY 2017; RECURRENT OVARIAN CA-high-grade serous/platinum refractory  STAGE: IV        ;GOALS:  PALLIATIVE  CURRENT/MOST RECENT THERAPY: June 2nd week  Lynparza      Malignant neoplasm of ovary ([place])    Ovarian cancer, unspecified laterality ([place])      INTERVAL HISTORY:  Jamie Conner 76 y.o.  female pleasant patient above history of high-grade serous ovarian cancer platinum refractory currently on Jamie Conner is here for follow-up.  Patient complains of worsening malaise/fatigue.  Denies abdominal discomfort nausea vomiting.  Does admit to scratchy throat runny nose.  Denies any fevers or chills.  Complains of mild cough with phlegm.  Her left neck swelling has improved status post antibiotic.  Review of Systems  Constitutional: Positive for malaise/fatigue. Negative for chills, diaphoresis, fever and weight loss.  HENT: Negative for nosebleeds and sore throat.   Eyes: Negative for double vision.  Respiratory: Positive for cough. Negative for hemoptysis, sputum production, shortness of breath and wheezing.        Scratchy throat runny nose.  Cardiovascular: Negative for chest pain, palpitations, orthopnea and leg swelling.  Gastrointestinal: Negative for abdominal pain, blood in stool, constipation, diarrhea, heartburn, melena, nausea and vomiting.  Genitourinary: Negative for dysuria, frequency and urgency.  Musculoskeletal: Negative for back pain and joint pain.  Skin: Negative.  Negative for itching and rash.  Neurological: Negative for dizziness, tingling, focal weakness, weakness and headaches.  Endo/Heme/Allergies: Does not bruise/bleed easily.  Psychiatric/Behavioral: Negative for depression. The patient is not nervous/anxious and does not have insomnia.       PAST MEDICAL HISTORY :  Past Medical History:  Diagnosis Date  . Atrial fibrillation (Jamie Conner)   . Breast cancer ([place]) 1998  . Cancer of breast ([place]) 1998   Left/radiation  . CINV (chemotherapy-induced nausea and vomiting)   .  GERD (gastroesophageal reflux disease)   . Hyperlipidemia   . Hypothyroidism    . Mitral insufficiency   . Ovarian cancer ([place])    s/p BSO optimal tumor debulking May 2016/chemo  . Personal history of chemotherapy since 2016   ovarian cancer  . Personal history of radiation therapy 1998  . PVC (premature ventricular contraction)     PAST SURGICAL HISTORY :   Past Surgical History:  Procedure Laterality Date  . ABDOMINAL HYSTERECTOMY    . BILATERAL SALPINGOOPHORECTOMY  May 2016   with optimal tumor debulking   . BREAST LUMPECTOMY Left 1998  . BREAST SURGERY     Left  . CESAREAN SECTION     x2  . CHOLECYSTECTOMY    . COLONOSCOPY  2006  . DEBULKING N/A 12/22/2014   Procedure: DEBULKING;  Surgeon: Jamie Ends, MD;  Location: [facility];  Service: Gynecology;  Laterality: N/A;  . LAPAROTOMY N/A 12/22/2014   Procedure: EXPLORATORY LAPAROTOMY;  Surgeon: Jamie Bellow, MD;  Location: [facility];  Service: General;  Laterality: N/A;  . LAPAROTOMY WITH STAGING N/A 12/22/2014   Procedure: LAPAROTOMY WITH STAGING;  Surgeon: Jamie Ends, MD;  Location: [facility];  Service: Gynecology;  Laterality: N/A;  . LEFT HEART CATH AND CORONARY ANGIOGRAPHY N/A 12/10/2017   Procedure: LEFT HEART CATH AND CORONARY ANGIOGRAPHY;  Surgeon: Jamie Kida, MD;  Location: [facility];  Service: Cardiovascular;  Laterality: N/A;  . PERIPHERAL VASCULAR CATHETERIZATION Left 12/22/2014   Procedure: PORTA CATH INSERTION;  Surgeon: Jamie Bellow, MD;  Location: [facility];  Service: General;  Laterality: Left;  . PORTACATH PLACEMENT Right 12/22/2014   Procedure: INSERTION PORT-A-CATH;  Surgeon: Jamie Bellow, MD;  Location: [facility];  Service: General;  Laterality: Right;  . SALPINGOOPHORECTOMY Bilateral 12/22/2014   Procedure: SALPINGO OOPHORECTOMY;  Surgeon: Jamie Ends, MD;  Location: [facility];  Service: Gynecology;  Laterality: Bilateral;  . UPPER GI ENDOSCOPY  09/25/04   hiatus hernia, schatzki ring and a single gastric polyp    FAMILY HISTORY :    Family History  Problem Relation Age of Onset  . Cancer Mother        breast, throat, and stomach  . Breast cancer Mother   . Heart disease Father   . Pulmonary embolism Sister   . Cancer Brother 60       angiosarcoma of the chest; carcinoid small intestinal tumor  . Hypertension Brother   . Stroke Brother   . Cancer Maternal Aunt        breast cancer  . Cancer Maternal Grandfather 53       pancreatic    SOCIAL HISTORY:   Social History   Tobacco Use  . Smoking status: Never Smoker  . Smokeless tobacco: Never Used  Substance Use Topics  . Alcohol use: No  . Drug use: No    ALLERGIES:  is allergic to carafate [sucralfate] and sulfa antibiotics.  MEDICATIONS:  Current Outpatient Medications  Medication Sig Dispense Refill  . aspirin EC 325 MG tablet Take 1 tablet (325 mg total) by mouth daily. 100 tablet 3  . Cholecalciferol (VITAMIN D) 2000 units tablet Take 1 tablet (2,000 Units total) by mouth daily. 30 tablet 12  . levothyroxine (SYNTHROID, LEVOTHROID) 50 MCG tablet TAKE ONE TABLET ON AN EMPTY STOMACH WITHA GLASS OF WATER AT LEAST 30 TO 60 MINUTES BEFORE BREAKFAST 30 tablet 12  . lidocaine-prilocaine (EMLA) cream Apply to port area 30-45 mins prior to chemo.  30 g 5  . loratadine (CLARITIN) 10 MG tablet Take 10 mg by mouth daily.    Marland Kitchen LORazepam (ATIVAN) 0.5 MG tablet Take 0.5 tablets (0.25 mg total) by mouth at bedtime. 30 tablet 4  . Lysine 500 MG TABS Take 1 tablet by mouth daily as needed.     . metoprolol succinate (TOPROL-XL) 25 MG 24 hr tablet Take 1 tablet by mouth daily.    . Multiple Vitamin (MULTIVITAMIN) capsule Take 1 capsule by mouth daily.    Marland Kitchen olaparib (LYNPARZA) 150 MG tablet Take 2 tablets (300 mg total) by mouth 2 (two) times daily. 120 tablet 3  . acetaminophen (TYLENOL) 500 MG tablet Take 500 mg by mouth every 6 (six) hours as needed.    Marland Kitchen alum & mag hydroxide-simeth (MAALOX/MYLANTA) 200-200-20 MG/5ML suspension Take 15 mLs by mouth every 6 (six)  hours as needed for indigestion or heartburn. 355 mL 0  . azithromycin (ZITHROMAX) 250 MG tablet 500 mg PO x1 on day 1, then 250 mg PO q24h x4 days 6 each 0  . Docosanol (ABREVA EX) Apply 1 application topically as needed. Fever blisters    . fluticasone (FLONASE) 50 MCG/ACT nasal spray Place 1 spray into both nostrils daily.    . furosemide (LASIX) 20 MG tablet Take 0.5 tablets (10 mg total) by mouth daily. (Patient not taking: Reported on 03/20/2018) 30 tablet 0  . lactulose (CHRONULAC) 10 GM/15ML solution Take 15 mLs (10 g total) by mouth daily as needed for moderate constipation. 236 mL 0  . magic mouthwash w/lidocaine SOLN Take 5 mLs by mouth every 4 (four) hours as needed.  1  . ondansetron (ZOFRAN) 4 MG tablet One pill 30-60 mins prior to taking zejula to prevent nausea/vomitting. (Patient not taking: Reported on 03/20/2018) 45 tablet 3  . pantoprazole (PROTONIX) 40 MG tablet Take 1 tablet (40 mg total) by mouth daily. 30 tablet 1  . polyethylene glycol (MIRALAX / GLYCOLAX) packet Take 17 g by mouth daily as needed.    . traMADol (ULTRAM) 50 MG tablet Take 1 tablet (50 mg total) by mouth every 6 (six) hours as needed. (Patient not taking: Reported on 03/20/2018) 30 tablet 0   No current facility-administered medications for this visit.     PHYSICAL EXAMINATION: ECOG PERFORMANCE STATUS: 1 - Symptomatic but completely ambulatory  BP 123/76   Pulse 69   Temp (!) 97.2 F (36.2 C) (Tympanic)   Resp 20   Ht _0  (1.626 m)   Wt 190 lb (86.2 kg)   BMI 32.61 kg/m   Filed Weights   03/20/18 1054  Weight: 190 lb (86.2 kg)    GENERAL: Well-nourished well-developed; Alert, no distress and comfortable.  Accompanied by husband. EYES: no pallor or icterus OROPHARYNX: no thrush or ulceration; NECK: supple; no lymph nodes felt. LYMPH:  no palpable lymphadenopathy in the axillary or inguinal regions LUNGS: Decreased breath sounds auscultation bilaterally. No wheeze or crackles HEART/CVS:  regular rate & rhythm and no murmurs; No lower extremity edema ABDOMEN:abdomen soft, non-tender and normal bowel sounds. No hepatomegaly or splenomegaly.  Musculoskeletal:no cyanosis of digits and no clubbing  PSYCH: alert & oriented x 3 with fluent speech NEURO: no focal motor/sensory deficits SKIN:  no rashes or significant lesions    LABORATORY DATA:  I have reviewed the data as listed    Component Value Date/Time   NA 140 03/20/2018 1029   NA 140 12/14/2014 1037   K 4.0 03/20/2018 1029   K 4.4  12/14/2014 1037   CL 108 03/20/2018 1029   CL 106 12/14/2014 1037   CO2 23 03/20/2018 1029   CO2 26 12/14/2014 1037   GLUCOSE 104 (H) 03/20/2018 1029   GLUCOSE 87 12/14/2014 1037   BUN 14 03/20/2018 1029   BUN 12 12/14/2014 1037   CREATININE 1.17 (H) 03/20/2018 1029   CREATININE 0.97 12/14/2014 1037   CALCIUM 9.3 03/20/2018 1029   CALCIUM 9.2 12/14/2014 1037   PROT 6.6 03/20/2018 1029   PROT 7.3 12/14/2014 1037   ALBUMIN 3.4 (L) 03/20/2018 1029   ALBUMIN 3.8 12/14/2014 1037   AST 22 03/20/2018 1029   AST 20 12/14/2014 1037   ALT 12 03/20/2018 1029   ALT 11 (L) 12/14/2014 1037   ALKPHOS 64 03/20/2018 1029   ALKPHOS 138 (H) 12/14/2014 1037   BILITOT 0.5 03/20/2018 1029   BILITOT 0.5 12/14/2014 1037   GFRNONAA 45 (L) 03/20/2018 1029   GFRNONAA 59 (L) 12/14/2014 1037   GFRAA 52 (L) 03/20/2018 1029   GFRAA >60 12/14/2014 1037    No results found for: SPEP, UPEP  Lab Results  Component Value Date   WBC 6.7 03/20/2018   NEUTROABS 3.6 03/20/2018   HGB 12.5 03/20/2018   HCT 37.3 03/20/2018   MCV 99.0 03/20/2018   PLT 248 03/20/2018      Chemistry      Component Value Date/Time   NA 140 03/20/2018 1029   NA 140 12/14/2014 1037   K 4.0 03/20/2018 1029   K 4.4 12/14/2014 1037   CL 108 03/20/2018 1029   CL 106 12/14/2014 1037   CO2 23 03/20/2018 1029   CO2 26 12/14/2014 1037   BUN 14 03/20/2018 1029   BUN 12 12/14/2014 1037   CREATININE 1.17 (H) 03/20/2018 1029    CREATININE 0.97 12/14/2014 1037   GLU 84 03/16/2014      Component Value Date/Time   CALCIUM 9.3 03/20/2018 1029   CALCIUM 9.2 12/14/2014 1037   ALKPHOS 64 03/20/2018 1029   ALKPHOS 138 (H) 12/14/2014 1037   AST 22 03/20/2018 1029   AST 20 12/14/2014 1037   ALT 12 03/20/2018 1029   ALT 11 (L) 12/14/2014 1037   BILITOT 0.5 03/20/2018 1029   BILITOT 0.5 12/14/2014 1037       RADIOGRAPHIC STUDIES: I have personally reviewed the radiological images as listed and agreed with the findings in the report. No results found.   ASSESSMENT & PLAN:  Ovarian cancer, unspecified laterality ([place]) # RECURRENT PLATINUM REFRACTORY HIGH GRADE SEROUS ovarian cancer- march 8th CT/PET- Progression; with possible cecal implants; increasing vaginal cuff thickness.   # Currently on Lynparza 300 mg twice a day [full dose July 5th 2019]; clinically stable however CEA 125 slightly rising.  #Left neck swelling status post Augmentin improved.  #Awaiting dental appointment-recommend talking to cardiology regarding aspirin.  # Runny nose/ throat scratchy/ - no fever/ [grand kids-sick]; plegm question URI recommend-claritin/ mucienx   #Nausea-secondary to [place]  Stable.  #TBD- if ca-125 going up; will need CT scans neck/c/a/p; otherwise.  [ follow up in 3 weeks/labs/Ca-125/MD]   No orders of the defined types were placed in this encounter.  All questions were answered. The patient knows to call the clinic with any problems, questions or concerns.      Jamie Sickle, MD 03/30/2018 9:17 PM

## 2018-03-20 NOTE — Assessment & Plan Note (Addendum)
#   RECURRENT PLATINUM REFRACTORY HIGH GRADE SEROUS ovarian cancer- march 8th CT/PET- Progression; with possible cecal implants; increasing vaginal cuff thickness.   # Currently on Lynparza 300 mg twice a day [full dose July 5th 2019]; clinically stable however CEA 125 slightly rising.  #Left neck swelling status post Augmentin improved.  #Awaiting dental appointment-recommend talking to cardiology regarding aspirin.  # Runny nose/ throat scratchy/ - no fever/ [grand kids-sick]; plegm question URI recommend-claritin/ mucienx   #Nausea-secondary to [place]  Stable.  #TBD- if ca-125 going up; will need CT scans neck/c/a/p; otherwise.  [ follow up in 3 weeks/labs/Ca-125/MD]

## 2018-03-21 ENCOUNTER — Telehealth: Payer: Self-pay | Admitting: Internal Medicine

## 2018-03-21 DIAGNOSIS — C569 Malignant neoplasm of unspecified ovary: Secondary | ICD-10-CM

## 2018-03-21 LAB — CA 125: CANCER ANTIGEN (CA) 125: 181 U/mL — AB (ref 0.0–38.1)

## 2018-03-21 NOTE — Telephone Encounter (Signed)
Results provided to patient. She is agreeable to obtain ct scan. msg sent to sch. Team. Also spoke with christina in [place]- no PA required for ct scan.

## 2018-03-21 NOTE — Telephone Encounter (Signed)
Heather-  Please inform patient that her tumor marker CA 1-5 is rising currently 181.  I would recommend CT of the neck/chest and pelvis with contrast asap; follow up with me few days after [no labs]   Thx GB

## 2018-03-25 ENCOUNTER — Encounter: Payer: Self-pay | Admitting: Nurse Practitioner

## 2018-03-25 ENCOUNTER — Telehealth: Payer: Self-pay | Admitting: *Deleted

## 2018-03-25 ENCOUNTER — Inpatient Hospital Stay (HOSPITAL_BASED_OUTPATIENT_CLINIC_OR_DEPARTMENT_OTHER): Payer: PPO | Admitting: Nurse Practitioner

## 2018-03-25 VITALS — BP 122/74 | HR 66 | Temp 97.9°F | Resp 18 | Wt 190.0 lb

## 2018-03-25 DIAGNOSIS — Z5111 Encounter for antineoplastic chemotherapy: Secondary | ICD-10-CM | POA: Diagnosis not present

## 2018-03-25 DIAGNOSIS — C561 Malignant neoplasm of right ovary: Secondary | ICD-10-CM

## 2018-03-25 DIAGNOSIS — J0100 Acute maxillary sinusitis, unspecified: Secondary | ICD-10-CM

## 2018-03-25 DIAGNOSIS — R590 Localized enlarged lymph nodes: Secondary | ICD-10-CM

## 2018-03-25 DIAGNOSIS — K5900 Constipation, unspecified: Secondary | ICD-10-CM

## 2018-03-25 MED ORDER — AZITHROMYCIN 250 MG PO TABS: ORAL_TABLET | ORAL | 0 refills | Status: DC

## 2018-03-25 MED ORDER — LACTULOSE 10 GM/15ML PO SOLN: 10.0000 g | Freq: Every day | ORAL | 0 refills | Status: AC | PRN

## 2018-03-25 NOTE — Telephone Encounter (Signed)
Patient called to report that she is no better, she is getting worse, the Mucinex is not helping. She asks if should be seen. She has a cough, congestion and is blowing out and coughing up yellow mucous. She denies fever. Please advise

## 2018-03-25 NOTE — Progress Notes (Signed)
Symptom Management [place]  [phone] [phone]) [phone]  Patient Care Team: Jerrol Banana., MD as PCP - General (Family Medicine) Gillis Ends, MD as Referring Physician (Obstetrics and Gynecology) Cammie Sickle, MD as Consulting Physician (Oncology) Corey Skains, MD as Consulting Physician (Cardiology) Marval Regal, NP as Nurse Practitioner (Nurse Practitioner) Manya Silvas, MD as Consulting Physician (Gastroenterology)   Name of the patient: Jamie Conner  [id number]  Jan 17, 1941   Date of visit: 03/25/18  Diagnosis-ovarian cancer  Chief complaint/ Reason for visit-URI  Heme/Onc history:  Oncology History   # April- MAY 2016- HIGH GRADE SEROUS OVARIAN CANCER STAGE IIIC; CA-125 +2300+;  s/p OPTIMAL DEBULKING SURGERY   # MAY 2016[name] DD [finished in Sep 19th 2017]  # MAY CT 2017- RECURRENT/Peritoneal carcinomatosis/pelvic implant ~1.2cm/Ca-125-34;   # July 3rd 2017- CARBO-DOXIL q 4W [with neulasta]; CT May 11 2016- slight increase peritoneal / stable nodules. Cont chemo [finished DEC 2017]. DEC 28th 2017 CT- Increase in peritoneal deposits; increasing Ca 125 [60s];  # Jan 2018- carbo-Taxol-Avastin x6 cycles; Aug 2018- CR; Aug 2018- start Avastin Maintenance; MARCH 2019- PROGRESSION- STOP avastin.   # MID April 2019- Zejula [300 mg/day]; 4 days- later STOPPED sec to Chest pain;  # June 1st week- Lynparza    # May 2019- TAKASUBO ? [s/p cardiac cath- Dr.Kowalski]  # G-1-2 hand foot syndrome-   # LEFT BREAST CA s/p Lumpec & RT s/p TAM   # Afib [cardiology]; JUNE 2017- MUGA 62%  Jan 2018- MOLECULAR TESTING- ATM DELETERIOUS HETEROZYGOUS mutation; TUMOR BRCA- NEG; Myriad genomic instability- NEGATIVE;   # NGS/Omniseq- BRCA-2 copy loss/ * [april2019]; NEG- BRCA Mutations/N-TRK/MSI-STABLE.  ---------------------------------------------------------------------    DIAGNOSIS:  MAY 2017; RECURRENT OVARIAN CA-high-grade serous/platinum refractory  STAGE: IV        ;GOALS: PALLIATIVE  CURRENT/MOST RECENT THERAPY: June 2nd week  Lynparza      Malignant neoplasm of ovary ([place])    Ovarian cancer, unspecified laterality ([place])    Interval history- Jamie Conner presents with a report of possible sinusitis. Symptoms include congestion, coryza, cough, plugged sensation in the right ear and sore throat. Onset of symptoms was 8 days ago, gradually worsening since that time.  She is drinking moderate amounts of fluids. Evaluation to date: seen previously and thought to have a viral URI. Treatment to date: antihistamines, cough suppressants and decongestants which have not improved her symptoms.  She states that her grandchildren visited last week and her grandson was diagnosed with strep throat and other grandchild had "cold".  She was previously treated with Augmentin for swollen left submandibular.  Since that time she states that painful lymph nodes is improved/resolved.  She has CT scan to follow-up on later this week. She also complains of constipation and requests refill of lactulose which she takes 'once or twice a month; very infrequently'.    ECOG FS:1 - Symptomatic but completely ambulatory  Review of Systems General:  fatigued Eyes: eye pressure & headache HEENT: sore, scratchy throat. LN improved Pulmonary: no complaints Cardiac: no complaints Gastrointestinal: nausea & constipation Genitourinary/Gyn: no complaints; vaginal cuff thickness increased per last gyn note Musculoskeletal: no complaints Neurologic/Psych: no complaints  Current treatment- Lynparza 300 mg BID  Allergies  Allergen Reactions  . Carafate [Sucralfate] Rash  . Sulfa Antibiotics Rash    Past Medical History:  Diagnosis Date  . Atrial fibrillation ([place])   . Breast cancer ([place]) 1998  . Cancer of breast (  [place]) 1998   Left/radiation  . CINV (chemotherapy-induced nausea and  vomiting)   . GERD (gastroesophageal reflux disease)   . Hyperlipidemia   . Hypothyroidism   . Mitral insufficiency   . Ovarian cancer ([place])    s/p BSO optimal tumor debulking May 2016/chemo  . Personal history of chemotherapy since 2016   ovarian cancer  . Personal history of radiation therapy 1998  . PVC (premature ventricular contraction)     Past Surgical History:  Procedure Laterality Date  . ABDOMINAL HYSTERECTOMY    . BILATERAL SALPINGOOPHORECTOMY  May 2016   with optimal tumor debulking   . BREAST LUMPECTOMY Left 1998  . BREAST SURGERY     Left  . CESAREAN SECTION     x2  . CHOLECYSTECTOMY    . COLONOSCOPY  2006  . DEBULKING N/A 12/22/2014   Procedure: DEBULKING;  Surgeon: Gillis Ends, MD;  Location: [facility];  Service: Gynecology;  Laterality: N/A;  . LAPAROTOMY N/A 12/22/2014   Procedure: EXPLORATORY LAPAROTOMY;  Surgeon: Robert Bellow, MD;  Location: [facility];  Service: General;  Laterality: N/A;  . LAPAROTOMY WITH STAGING N/A 12/22/2014   Procedure: LAPAROTOMY WITH STAGING;  Surgeon: Gillis Ends, MD;  Location: [facility];  Service: Gynecology;  Laterality: N/A;  . LEFT HEART CATH AND CORONARY ANGIOGRAPHY N/A 12/10/2017   Procedure: LEFT HEART CATH AND CORONARY ANGIOGRAPHY;  Surgeon: Yolonda Kida, MD;  Location: [place] [facility];  Service: Cardiovascular;  Laterality: N/A;  . PERIPHERAL VASCULAR CATHETERIZATION Left 12/22/2014   Procedure: PORTA CATH INSERTION;  Surgeon: Robert Bellow, MD;  Location: [facility];  Service: General;  Laterality: Left;  . PORTACATH PLACEMENT Right 12/22/2014   Procedure: INSERTION PORT-A-CATH;  Surgeon: Robert Bellow, MD;  Location: [facility];  Service: General;  Laterality: Right;  . SALPINGOOPHORECTOMY Bilateral 12/22/2014   Procedure: SALPINGO OOPHORECTOMY;  Surgeon: Gillis Ends, MD;  Location: [facility];  Service: Gynecology;  Laterality: Bilateral;  . UPPER GI ENDOSCOPY  09/25/04   hiatus hernia,  schatzki ring and a single gastric polyp    Social History   Socioeconomic History  . Marital status: Married    Spouse name: Not on file  . Number of children: 2  . Years of education: Not on file  . Highest education level: Bachelor's degree (e.g., BA, AB, BS)  Occupational History  . Occupation: retired  Scientific laboratory technician  . Financial resource strain: Not hard at all  . Food insecurity:    Worry: Never true    Inability: Never true  . Transportation needs:    Medical: No    Non-medical: No  Tobacco Use  . Smoking status: Never Smoker  . Smokeless tobacco: Never Used  Substance and Sexual Activity  . Alcohol use: No  . Drug use: No  . Sexual activity: Not Currently  Lifestyle  . Physical activity:    Days per week: Not on file    Minutes per session: Not on file  . Stress: To some extent  Relationships  . Social connections:    Talks on phone: Not on file    Gets together: Not on file    Attends religious service: Not on file    Active member of club or organization: Not on file    Attends meetings of clubs or organizations: Not on file    Relationship status: Not on file  . Intimate partner violence:    Fear of current or ex partner: Not on  file    Emotionally abused: Not on file    Physically abused: Not on file    Forced sexual activity: Not on file  Other Topics Concern  . Not on file  Social History Narrative  . Not on file    Family History  Problem Relation Age of Onset  . Cancer Mother        breast, throat, and stomach  . Breast cancer Mother   . Heart disease Father   . Pulmonary embolism Sister   . Cancer Brother 60       angiosarcoma of the chest; carcinoid small intestinal tumor  . Hypertension Brother   . Stroke Brother   . Cancer Maternal Aunt        breast cancer  . Cancer Maternal Grandfather 64       pancreatic     Current Outpatient Medications:  .  acetaminophen (TYLENOL) 500 MG tablet, Take 500 mg by mouth every 6 (six) hours as  needed., Disp: , Rfl:  .  alum & mag hydroxide-simeth (MAALOX/MYLANTA) 200-200-20 MG/5ML suspension, Take 15 mLs by mouth every 6 (six) hours as needed for indigestion or heartburn., Disp: 355 mL, Rfl: 0 .  aspirin EC 325 MG tablet, Take 1 tablet (325 mg total) by mouth daily., Disp: 100 tablet, Rfl: 3 .  Cholecalciferol (VITAMIN D) 2000 units tablet, Take 1 tablet (2,000 Units total) by mouth daily., Disp: 30 tablet, Rfl: 12 .  Docosanol (ABREVA EX), Apply 1 application topically as needed. Fever blisters, Disp: , Rfl:  .  fluticasone (FLONASE) 50 MCG/ACT nasal spray, Place 1 spray into both nostrils daily., Disp: , Rfl:  .  lactulose (CHRONULAC) 10 GM/15ML solution, Take by mouth daily as needed for mild constipation., Disp: , Rfl:  .  levothyroxine (SYNTHROID, LEVOTHROID) 50 MCG tablet, TAKE ONE TABLET ON AN EMPTY STOMACH WITHA GLASS OF WATER AT LEAST 30 TO 60 MINUTES BEFORE BREAKFAST, Disp: 30 tablet, Rfl: 12 .  lidocaine-prilocaine (EMLA) cream, Apply to port area 30-45 mins prior to chemo., Disp: 30 g, Rfl: 5 .  LORazepam (ATIVAN) 0.5 MG tablet, Take 0.5 tablets (0.25 mg total) by mouth at bedtime., Disp: 30 tablet, Rfl: 4 .  Lysine 500 MG TABS, Take 1 tablet by mouth daily as needed. , Disp: , Rfl:  .  magic mouthwash w/lidocaine SOLN, Take 5 mLs by mouth every 4 (four) hours as needed., Disp: , Rfl: 1 .  metoprolol succinate (TOPROL-XL) 25 MG 24 hr tablet, Take 1 tablet by mouth daily., Disp: , Rfl:  .  Multiple Vitamin (MULTIVITAMIN) capsule, Take 1 capsule by mouth daily., Disp: , Rfl:  .  olaparib (LYNPARZA) 150 MG tablet, Take 2 tablets (300 mg total) by mouth 2 (two) times daily., Disp: 120 tablet, Rfl: 3 .  pantoprazole (PROTONIX) 40 MG tablet, Take 1 tablet (40 mg total) by mouth daily., Disp: 30 tablet, Rfl: 1 .  polyethylene glycol (MIRALAX / GLYCOLAX) packet, Take 17 g by mouth daily as needed., Disp: , Rfl:  .  furosemide (LASIX) 20 MG tablet, Take 0.5 tablets (10 mg total) by  mouth daily. (Patient not taking: Reported on 03/20/2018), Disp: 30 tablet, Rfl: 0 .  loratadine (CLARITIN) 10 MG tablet, Take 10 mg by mouth daily., Disp: , Rfl:  .  ondansetron (ZOFRAN) 4 MG tablet, One pill 30-60 mins prior to taking zejula to prevent nausea/vomitting. (Patient not taking: Reported on 03/20/2018), Disp: 45 tablet, Rfl: 3 .  traMADol (ULTRAM) 50 MG tablet, Take  1 tablet (50 mg total) by mouth every 6 (six) hours as needed. (Patient not taking: Reported on 03/20/2018), Disp: 30 tablet, Rfl: 0  Physical exam:  Vitals:   03/25/18 1337 03/25/18 1339 03/25/18 1344  BP:  122/74   Pulse:  66   Resp:   18  Temp:  97.9 F (36.6 C)   TempSrc:  Tympanic   SpO2:  97%   Weight: 190 lb (86.2 kg)     GENERAL: Well developed, well nourished. NAD. Unaccompanied.  HEENT:  Turbinates inflamed, R&L maxillary sinuses ttp. Mild bulging l tm.  NODES:  Left sub-mandicular LN palpable, non-tender, ~ 1 cm.  LUNGS:  Clear to auscultation bilaterally.  No wheezes or rhonchi. HEART:  Regular rate and rhythm. No murmur appreciated. ABDOMEN:  Soft, nontender. EXTREMITIES:  No peripheral edema.   SKIN:  Clear with no obvious rashes or skin changes. No nail dyscrasia. NEURO:  Nonfocal. Well oriented.  Appropriate affect.    CMP Latest Ref Rng & Units 03/20/2018  Glucose 70 - 99 mg/dL 104(H)  BUN 8 - 23 mg/dL 14  Creatinine 0.44 - 1.00 mg/dL 1.17(H)  Sodium 135 - 145 mmol/L 140  Potassium 3.5 - 5.1 mmol/L 4.0  Chloride 98 - 111 mmol/L 108  CO2 22 - 32 mmol/L 23  Calcium 8.9 - 10.3 mg/dL 9.3  Total Protein 6.5 - 8.1 g/dL 6.6  Total Bilirubin 0.3 - 1.2 mg/dL 0.5  Alkaline Phos 38 - 126 U/L 64  AST 15 - 41 U/L 22  ALT 0 - 44 U/L 12   CBC Latest Ref Rng & Units 03/20/2018  WBC 3.6 - 11.0 K/uL 6.7  Hemoglobin 12.0 - 16.0 g/dL 12.5  Hematocrit 35.0 - 47.0 % 37.3  Platelets 150 - 440 K/uL 248    No images are attached to the encounter.  No results found.  Assessment and plan- Patient is a 77  y.o. female with ovarian cancer who presents to [facility] for URI and enlarged LN.   1. Ovarian Cancer- recurrent platinum refractory high grade serous ovarian cancer. 10/25/17 CT showed progression. Currently on Lynparza Noel Journey stopped d/t cardiac issues). Ca 125 has been rising. Imaging scheduled for re-evaluation per Dr. Rogue Bussing.   2. Enlarged lymph node- resolved; left submandibular LN previously enlarged and ttp. Tx with augmentin. Palpable today, roughly ~ 1 cm. Non-tender. Imaging to evaluate further.   3. URI/acute sinusitis- recent exposure to sick contacts including strep positive grandson. Suspect sinusitis given bulging TM, eye pressure/ha, tenderness of maxillary sinuses and sore teeth/gums. We discussed Afrin BID x 3 days along with simultaneous Flonase BID with expectation of continuing Flonase after completing course of Afrin. This should help to open up the paranasal sinuses, to improve outflow and help resolve the infection. We discussed the pathophysiology of how edema from viruses can cause sinus obstruction and secondary bacterial infection, and ultimately ventilating the sinuses is critical to improving and overcoming the infection. Will give short course of azithromycin .   4. Constipation- we discussed taking stool softeners such as Senna and/or Miralax every day to avoid constipation and to hold if diarrhea. Recommended drinking plenty of fluid, eating fruits and vegetable, and being active also reduces the risk of constipation. The goal is to have at least one bowel movement every day or every other day without pain or straining.  However, if you are taking stool softeners and no Bm for 2 or more days then to please take laxative such as milk of magnesia or  mag citrate every day. Will refill lactulose today as well.   CT neck/chest/abdomen/pelvis scheduled for 03/28/18. Dr. Rogue Bussing will review results and either call patient with results or have her rtc. Patient advised to notify the  clinic if there is no improvement in symptoms or if symptoms worsen in next 3-4 days.   Visit Diagnosis 1. Malignant neoplasm of right ovary (HCC)   2. Lymphadenopathy, submandibular   3. Acute non-recurrent maxillary sinusitis   4. Constipation, unspecified constipation type     Patient expressed understanding and was in agreement with this plan. She also understands that She can call clinic at any time with any questions, concerns, or complaints.   Thank you for allowing me to participate in the care of this very pleasant patient.   Beckey Rutter, DNP, AGNP-C [place] [facility] [phone] (work cell) [phone] (office)

## 2018-03-25 NOTE — Telephone Encounter (Signed)
Per Ander Purpura, see patient today. Patient accepts appointment at 1:30PM

## 2018-03-27 MED FILL — LYNPARZA 150 MG TABLET: 150 | 30 days supply | Qty: 120 | Fill #2

## 2018-03-28 ENCOUNTER — Encounter: Payer: Self-pay | Admitting: Radiology

## 2018-03-28 ENCOUNTER — Ambulatory Visit
Admission: RE | Admit: 2018-03-28 | Discharge: 2018-03-28 | Disposition: A | Discharge status: Home / Self Care | Payer: PPO | Source: Ambulatory Visit | Attending: Internal Medicine | Admitting: Internal Medicine

## 2018-03-28 DIAGNOSIS — C786 Secondary malignant neoplasm of retroperitoneum and peritoneum: Secondary | ICD-10-CM | POA: Diagnosis not present

## 2018-03-28 DIAGNOSIS — D1809 Hemangioma of other sites: Secondary | ICD-10-CM | POA: Diagnosis not present

## 2018-03-28 DIAGNOSIS — C569 Malignant neoplasm of unspecified ovary: Secondary | ICD-10-CM | POA: Insufficient documentation

## 2018-03-28 DIAGNOSIS — Z5111 Encounter for antineoplastic chemotherapy: Secondary | ICD-10-CM | POA: Diagnosis not present

## 2018-03-28 DIAGNOSIS — I700 Atherosclerosis of aorta: Secondary | ICD-10-CM | POA: Diagnosis not present

## 2018-03-28 MED ORDER — IOHEXOL 300 MG/ML  SOLN
80.0000 mL | Freq: Once | INTRAMUSCULAR | Status: AC | PRN
  Administered 2018-03-28: 80 mL via INTRAVENOUS

## 2018-03-30 ENCOUNTER — Telehealth: Payer: Self-pay | Admitting: Internal Medicine

## 2018-03-30 NOTE — Telephone Encounter (Signed)
Jamie Conner-please inform patient that I would like to talk to her regarding the results of the CT scan/to discuss next treatment options on August 14 at 9:00/no labs.  Jamie Conner - please let me know if patient has questions about her CT scan-I could talk to her the phone.

## 2018-03-31 NOTE — Telephone Encounter (Signed)
Spoke with patient. She is aware that md will review these results

## 2018-04-02 ENCOUNTER — Inpatient Hospital Stay (HOSPITAL_BASED_OUTPATIENT_CLINIC_OR_DEPARTMENT_OTHER): Payer: PPO | Admitting: Internal Medicine

## 2018-04-02 VITALS — BP 130/81 | HR 71 | Temp 97.9°F | Resp 16 | Wt 190.0 lb

## 2018-04-02 DIAGNOSIS — C569 Malignant neoplasm of unspecified ovary: Secondary | ICD-10-CM

## 2018-04-02 DIAGNOSIS — C561 Malignant neoplasm of right ovary: Secondary | ICD-10-CM

## 2018-04-02 DIAGNOSIS — R112 Nausea with vomiting, unspecified: Secondary | ICD-10-CM

## 2018-04-02 DIAGNOSIS — T451X5A Adverse effect of antineoplastic and immunosuppressive drugs, initial encounter: Secondary | ICD-10-CM

## 2018-04-02 DIAGNOSIS — Z5111 Encounter for antineoplastic chemotherapy: Secondary | ICD-10-CM | POA: Diagnosis not present

## 2018-04-02 MED ORDER — PROCHLORPERAZINE MALEATE 10 MG PO TABS: 10.0000 mg | ORAL_TABLET | Freq: Four times a day (QID) | ORAL | 4 refills | Status: AC | PRN

## 2018-04-02 MED ORDER — ONDANSETRON HCL 8 MG PO TABS: 8.0000 mg | ORAL_TABLET | Freq: Three times a day (TID) | ORAL | 4 refills | Status: DC | PRN

## 2018-04-02 NOTE — Assessment & Plan Note (Addendum)
#   RECURRENT HIGH GRADE SEROUS ovarian cancer; currently on [place]. AUG 2019-CT abdomen pelvis shows progression-peritoneal/serosal.  Ca125-rising.  Discontinue Lynparza.  #Patient's last platinum was approximately 2 years ago; recommend gemcitabine carboplatin.  Patient's last dose of carboplatin was July 11, 2016.  Also discussed with Dr. Fransisca Connors.  # Discussed the potential side effects including but not limited to-increasing fatigue, nausea vomiting, diarrhea,, sores in the mouth, increase risk of infection and also neuropathy.  Less likely to have hair loss.   #  Again discussed the treatments are palliative; not curative.  Discussed that she will likely get 6-8 cycles of chemotherapy; but in general treatments are indefinite.  # # I reviewed the blood work- with the patient in detail; also reviewed the imaging independently [as summarized above]; and with the patient in detail.  # 40 minutes face-to-face with the patient discussing the above plan of care; more than 50% of time spent on prognosis/ natural history; counseling and coordination.

## 2018-04-02 NOTE — Progress Notes (Signed)
Jamie Conner OFFICE PROGRESS NOTE  Patient Care Team: Jerrol Banana., MD as PCP - General (Family Medicine) Gillis Ends, MD as Referring Physician (Obstetrics and Gynecology) Cammie Sickle, MD as Consulting Physician (Oncology) Corey Skains, MD as Consulting Physician (Cardiology) Marval Regal, NP as Nurse Practitioner (Nurse Practitioner) Manya Silvas, MD as Consulting Physician (Gastroenterology)  Cancer Staging Malignant neoplasm of ovary [facility]) Staging form: Ovary, AJCC 7th Edition - Clinical: Stage IIIC (T3c, N0, M0) - Unsigned    Oncology History   # April- MAY 2016- HIGH GRADE SEROUS OVARIAN CANCER STAGE IIIC; CA-125 +2300+;  s/p OPTIMAL DEBULKING SURGERY   # MAY 2016[name] DD [finished in Sep 19th 2017]  # MAY CT 2017- RECURRENT/Peritoneal carcinomatosis/pelvic implant ~1.2cm/Ca-125-34;   # July 3rd 2017- CARBO-DOXIL q 4W [with neulasta]; CT May 11 2016- slight increase peritoneal / stable nodules. Cont chemo [finished DEC 2017]. Last dose CARBO-07/11/2016; DEC 28th 2017 CT- Increase in peritoneal deposits; increasing Ca 125 [60s]- PLATINUM REFRACTORY  # Jan 2018- Taxol-Avastin x6 cycles; Aug 2018- CR; Aug 2018- start Avastin Maintenance; MARCH 2019- PROGRESSION- STOP avastin.   # MID April 2019- Zejula [300 mg/day]; 4 days- later STOPPED sec to Chest pain;  # June 1st week- Lynparza    # May 2019- TAKASUBO ? [s/p cardiac cath- Dr.Kowalski]  # G-1-2 hand foot syndrome-   # LEFT BREAST CA s/p Lumpec & RT s/p TAM   # Afib [cardiology]; JUNE 2017- MUGA 62%  Jan 2018- MOLECULAR TESTING- ATM DELETERIOUS HETEROZYGOUS mutation; TUMOR BRCA- NEG; Myriad genomic instability- NEGATIVE;   # NGS/Omniseq- BRCA-2 copy loss/ * [april2019]; NEG- BRCA Mutations/N-TRK/MSI-STABLE.  ---------------------------------------------------------------------    DIAGNOSIS: MAY 2017; RECURRENT OVARIAN CA-high-grade  serous/platinum refractory  STAGE: IV        ;GOALS: PALLIATIVE  CURRENT/MOST RECENT THERAPY: June 2nd week  Lynparza      Malignant neoplasm of ovary ([place])   04/08/2018 -  Chemotherapy    The patient had PALONOSETRON HCL INJECTION 0.25 MG/5ML, 0.25 mg, Intravenous,  Once, 0 of 4 cycles pegfilgrastim-cbqv (UDENYCA) injection 6 mg, 6 mg, Subcutaneous, Once, 0 of 4 cycles CARBOplatin (PARAPLATIN) in sodium chloride 0.9 % 100 mL chemo infusion, , Intravenous,  Once, 0 of 4 cycles gemcitabine (GEMZAR) 1,976 mg in sodium chloride 0.9 % 100 mL chemo infusion, 1,000 mg/m2, Intravenous,  Once, 0 of 4 cycles  for chemotherapy treatment.      Ovarian cancer, unspecified laterality ([place])      INTERVAL HISTORY:  Jamie Conner 77 y.o.  female pleasant patient above history of high-grade serous ovarian cancer currently on [name] is here for follow-up.  Patient complains of mild sore throat is improved.  Continues to complain of fatigue.  Mild abdominal distention/abdominal discomfort.  No constipation.  No diarrhea.  Review of Systems  Constitutional: Positive for malaise/fatigue. Negative for chills, diaphoresis, fever and weight loss.  HENT: Positive for sore throat. Negative for nosebleeds.   Eyes: Negative for double vision.  Respiratory: Negative for cough, hemoptysis, sputum production, shortness of breath and wheezing.   Cardiovascular: Negative for chest pain, palpitations, orthopnea and leg swelling.  Gastrointestinal: Negative for abdominal pain (Of abdominal discomfort/bloating.), blood in stool, constipation, diarrhea, heartburn, melena, nausea and vomiting.  Genitourinary: Negative for dysuria, frequency and urgency.  Musculoskeletal: Negative for back pain and joint pain.  Skin: Negative.  Negative for itching and rash.  Neurological: Negative for dizziness, tingling, focal weakness, weakness and headaches.  Endo/Heme/Allergies: Does  not bruise/bleed easily.   Psychiatric/Behavioral: Negative for depression. The patient is not nervous/anxious and does not have insomnia.       PAST MEDICAL HISTORY :  Past Medical History:  Diagnosis Date  . Atrial fibrillation ([place])   . Breast cancer ([place]) 1998  . Cancer of breast ([place]) 1998   Left/radiation  . CINV (chemotherapy-induced nausea and vomiting)   . GERD (gastroesophageal reflux disease)   . Hyperlipidemia   . Hypothyroidism   . Mitral insufficiency   . Ovarian cancer ([place])    s/p BSO optimal tumor debulking May 2016/chemo  . Personal history of chemotherapy since 2016   ovarian cancer  . Personal history of radiation therapy 1998  . PVC (premature ventricular contraction)     PAST SURGICAL HISTORY :   Past Surgical History:  Procedure Laterality Date  . ABDOMINAL HYSTERECTOMY    . BILATERAL SALPINGOOPHORECTOMY  May 2016   with optimal tumor debulking   . BREAST LUMPECTOMY Left 1998  . BREAST SURGERY     Left  . CESAREAN SECTION     x2  . CHOLECYSTECTOMY    . COLONOSCOPY  2006  . DEBULKING N/A 12/22/2014   Procedure: DEBULKING;  Surgeon: Gillis Ends, MD;  Location: [facility];  Service: Gynecology;  Laterality: N/A;  . LAPAROTOMY N/A 12/22/2014   Procedure: EXPLORATORY LAPAROTOMY;  Surgeon: Robert Bellow, MD;  Location: [facility];  Service: General;  Laterality: N/A;  . LAPAROTOMY WITH STAGING N/A 12/22/2014   Procedure: LAPAROTOMY WITH STAGING;  Surgeon: Gillis Ends, MD;  Location: [facility];  Service: Gynecology;  Laterality: N/A;  . LEFT HEART CATH AND CORONARY ANGIOGRAPHY N/A 12/10/2017   Procedure: LEFT HEART CATH AND CORONARY ANGIOGRAPHY;  Surgeon: Yolonda Kida, MD;  Location: [place] [facility];  Service: Cardiovascular;  Laterality: N/A;  . PERIPHERAL VASCULAR CATHETERIZATION Left 12/22/2014   Procedure: PORTA CATH INSERTION;  Surgeon: Robert Bellow, MD;  Location: [facility];  Service: General;  Laterality: Left;  . PORTACATH PLACEMENT Right  12/22/2014   Procedure: INSERTION PORT-A-CATH;  Surgeon: Robert Bellow, MD;  Location: [facility];  Service: General;  Laterality: Right;  . SALPINGOOPHORECTOMY Bilateral 12/22/2014   Procedure: SALPINGO OOPHORECTOMY;  Surgeon: Gillis Ends, MD;  Location: [facility];  Service: Gynecology;  Laterality: Bilateral;  . UPPER GI ENDOSCOPY  09/25/04   hiatus hernia, schatzki ring and a single gastric polyp    FAMILY HISTORY :   Family History  Problem Relation Age of Onset  . Cancer Mother        breast, throat, and stomach  . Breast cancer Mother   . Heart disease Father   . Pulmonary embolism Sister   . Cancer Brother 60       angiosarcoma of the chest; carcinoid small intestinal tumor  . Hypertension Brother   . Stroke Brother   . Cancer Maternal Aunt        breast cancer  . Cancer Maternal Grandfather 48       pancreatic    SOCIAL HISTORY:   Social History   Tobacco Use  . Smoking status: Never Smoker  . Smokeless tobacco: Never Used  Substance Use Topics  . Alcohol use: No  . Drug use: No    ALLERGIES:  is allergic to carafate [sucralfate] and sulfa antibiotics.  MEDICATIONS:  Current Outpatient Medications  Medication Sig Dispense Refill  . acetaminophen (TYLENOL) 500 MG tablet Take 500 mg by mouth every 6 (six) hours  as needed.    Marland Kitchen alum & mag hydroxide-simeth (MAALOX/MYLANTA) 200-200-20 MG/5ML suspension Take 15 mLs by mouth every 6 (six) hours as needed for indigestion or heartburn. 355 mL 0  . aspirin EC 325 MG tablet Take 1 tablet (325 mg total) by mouth daily. 100 tablet 3  . azithromycin (ZITHROMAX) 250 MG tablet 500 mg PO x1 on day 1, then 250 mg PO q24h x4 days 6 each 0  . Cholecalciferol (VITAMIN D) 2000 units tablet Take 1 tablet (2,000 Units total) by mouth daily. 30 tablet 12  . Docosanol (ABREVA EX) Apply 1 application topically as needed. Fever blisters    . fluticasone (FLONASE) 50 MCG/ACT nasal spray Place 1 spray into both nostrils daily.    .  furosemide (LASIX) 20 MG tablet Take 0.5 tablets (10 mg total) by mouth daily. 30 tablet 0  . lactulose (CHRONULAC) 10 GM/15ML solution Take 15 mLs (10 g total) by mouth daily as needed for moderate constipation. 236 mL 0  . levothyroxine (SYNTHROID, LEVOTHROID) 50 MCG tablet TAKE ONE TABLET ON AN EMPTY STOMACH WITHA GLASS OF WATER AT LEAST 30 TO 60 MINUTES BEFORE BREAKFAST 30 tablet 12  . lidocaine-prilocaine (EMLA) cream Apply to port area 30-45 mins prior to chemo. 30 g 5  . loratadine (CLARITIN) 10 MG tablet Take 10 mg by mouth daily.    Marland Kitchen Lysine 500 MG TABS Take 1 tablet by mouth daily as needed.     . magic mouthwash w/lidocaine SOLN Take 5 mLs by mouth every 4 (four) hours as needed.  1  . metoprolol succinate (TOPROL-XL) 25 MG 24 hr tablet Take 1 tablet by mouth daily.    . Multiple Vitamin (MULTIVITAMIN) capsule Take 1 capsule by mouth daily.    Marland Kitchen olaparib (LYNPARZA) 150 MG tablet Take 2 tablets (300 mg total) by mouth 2 (two) times daily. 120 tablet 3  . ondansetron (ZOFRAN) 4 MG tablet One pill 30-60 mins prior to taking zejula to prevent nausea/vomitting. 45 tablet 3  . pantoprazole (PROTONIX) 40 MG tablet Take 1 tablet (40 mg total) by mouth daily. 30 tablet 1  . polyethylene glycol (MIRALAX / GLYCOLAX) packet Take 17 g by mouth daily as needed.    . traMADol (ULTRAM) 50 MG tablet Take 1 tablet (50 mg total) by mouth every 6 (six) hours as needed. 30 tablet 0  . LORazepam (ATIVAN) 0.5 MG tablet 1 tablet at night as needed for anxiety/sleep 30 tablet 1  . ondansetron (ZOFRAN) 8 MG tablet Take 1 tablet (8 mg total) by mouth every 8 (eight) hours as needed for nausea or vomiting. 20 tablet 4  . prochlorperazine (COMPAZINE) 10 MG tablet Take 1 tablet (10 mg total) by mouth every 6 (six) hours as needed for nausea or vomiting. 30 tablet 4   No current facility-administered medications for this visit.     PHYSICAL EXAMINATION: ECOG PERFORMANCE STATUS: 1 - Symptomatic but completely  ambulatory  BP 130/81 (BP Location: Left Arm, Patient Position: Sitting)   Pulse 71   Temp 97.9 F (36.6 C) (Tympanic)   Resp 16   Wt 190 lb (86.2 kg)   BMI 32.61 kg/m   Filed Weights   04/02/18 0928  Weight: 190 lb (86.2 kg)    GENERAL: Well-nourished well-developed; Alert, no distress and comfortable.  Accompanied by family.  EYES: no pallor or icterus OROPHARYNX: no thrush or ulceration; NECK: supple; no lymph nodes felt. LYMPH:  no palpable lymphadenopathy in the axillary or inguinal regions LUNGS:  Decreased breath sounds auscultation bilaterally. No wheeze or crackles HEART/CVS: regular rate & rhythm and no murmurs; No lower extremity edema ABDOMEN:abdomen soft, non-tender and normal bowel sounds. No hepatomegaly or splenomegaly.  Musculoskeletal:no cyanosis of digits and no clubbing  PSYCH: alert & oriented x 3 with fluent speech NEURO: no focal motor/sensory deficits SKIN:  no rashes or significant lesions    LABORATORY DATA:  I have reviewed the data as listed    Component Value Date/Time   NA 140 03/20/2018 1029   NA 140 12/14/2014 1037   K 4.0 03/20/2018 1029   K 4.4 12/14/2014 1037   CL 108 03/20/2018 1029   CL 106 12/14/2014 1037   CO2 23 03/20/2018 1029   CO2 26 12/14/2014 1037   GLUCOSE 104 (H) 03/20/2018 1029   GLUCOSE 87 12/14/2014 1037   BUN 14 03/20/2018 1029   BUN 12 12/14/2014 1037   CREATININE 1.17 (H) 03/20/2018 1029   CREATININE 0.97 12/14/2014 1037   CALCIUM 9.3 03/20/2018 1029   CALCIUM 9.2 12/14/2014 1037   PROT 6.6 03/20/2018 1029   PROT 7.3 12/14/2014 1037   ALBUMIN 3.4 (L) 03/20/2018 1029   ALBUMIN 3.8 12/14/2014 1037   AST 22 03/20/2018 1029   AST 20 12/14/2014 1037   ALT 12 03/20/2018 1029   ALT 11 (L) 12/14/2014 1037   ALKPHOS 64 03/20/2018 1029   ALKPHOS 138 (H) 12/14/2014 1037   BILITOT 0.5 03/20/2018 1029   BILITOT 0.5 12/14/2014 1037   GFRNONAA 45 (L) 03/20/2018 1029   GFRNONAA 59 (L) 12/14/2014 1037   GFRAA 52 (L)  03/20/2018 1029   GFRAA >60 12/14/2014 1037    No results found for: SPEP, UPEP  Lab Results  Component Value Date   WBC 6.7 03/20/2018   NEUTROABS 3.6 03/20/2018   HGB 12.5 03/20/2018   HCT 37.3 03/20/2018   MCV 99.0 03/20/2018   PLT 248 03/20/2018      Chemistry      Component Value Date/Time   NA 140 03/20/2018 1029   NA 140 12/14/2014 1037   K 4.0 03/20/2018 1029   K 4.4 12/14/2014 1037   CL 108 03/20/2018 1029   CL 106 12/14/2014 1037   CO2 23 03/20/2018 1029   CO2 26 12/14/2014 1037   BUN 14 03/20/2018 1029   BUN 12 12/14/2014 1037   CREATININE 1.17 (H) 03/20/2018 1029   CREATININE 0.97 12/14/2014 1037   GLU 84 03/16/2014      Component Value Date/Time   CALCIUM 9.3 03/20/2018 1029   CALCIUM 9.2 12/14/2014 1037   ALKPHOS 64 03/20/2018 1029   ALKPHOS 138 (H) 12/14/2014 1037   AST 22 03/20/2018 1029   AST 20 12/14/2014 1037   ALT 12 03/20/2018 1029   ALT 11 (L) 12/14/2014 1037   BILITOT 0.5 03/20/2018 1029   BILITOT 0.5 12/14/2014 1037       RADIOGRAPHIC STUDIES: I have personally reviewed the radiological images as listed and agreed with the findings in the report. No results found.   ASSESSMENT & PLAN:  Ovarian cancer, unspecified laterality ([place]) # RECURRENT HIGH GRADE SEROUS ovarian cancer; currently on [place]. AUG 2019-CT abdomen pelvis shows progression-peritoneal/serosal.  Ca125-rising.  Discontinue Lynparza.  #Patient's last platinum was approximately 2 years ago; recommend gemcitabine carboplatin.  Patient's last dose of carboplatin was July 11, 2016.  Also discussed with Dr. Fransisca Connors.  # Discussed the potential side effects including but not limited to-increasing fatigue, nausea vomiting, diarrhea,, sores in the mouth, increase risk  of infection and also neuropathy.  Less likely to have hair loss.   #  Again discussed the treatments are palliative; not curative.  Discussed that she will likely get 6-8 cycles of chemotherapy; but in  general treatments are indefinite.  # # I reviewed the blood work- with the patient in detail; also reviewed the imaging independently [as summarized above]; and with the patient in detail.  # 40 minutes face-to-face with the patient discussing the above plan of care; more than 50% of time spent on prognosis/ natural history; counseling and coordination.    Orders Placed This Encounter  Procedures  . CBC with Differential/Platelet    Standing Status:   Future    Standing Expiration Date:   04/03/2019  . Comprehensive metabolic panel    Standing Status:   Future    Standing Expiration Date:   04/03/2019  . CA 125    Standing Status:   Future    Standing Expiration Date:   04/02/2019   All questions were answered. The patient knows to call the clinic with any problems, questions or concerns.      Cammie Sickle, MD 04/07/2018 11:37 AM

## 2018-04-04 NOTE — Progress Notes (Signed)
DISCONTINUE SELECTED CLINICAL TRIAL - Ovarian  Other Clinical Trial: avastin  **Trial eligibility and accrual should be confirmed by your research team**  REASON: Disease Progression PRIOR TREATMENT: Other Trial - avastin TREATMENT RESPONSE: Progressive Disease (PD)  START ON PATHWAY REGIMEN - Ovarian     A cycle is every 21 days:     Gemcitabine      Carboplatin   **Always confirm dose/schedule in your pharmacy ordering system**  Patient Characteristics: Recurrent or Progressive Disease, Third Line Therapy, Platinum Sensitive and >  6 Months Since Last Platinum Therapy, BRCA Mutation Absent/Unknown Therapeutic Status: Recurrent or Progressive Disease BRCA Mutation Status: Absent Line of Therapy: Third Line  Intent of Therapy: Non-Curative / Palliative Intent, Discussed with Patient

## 2018-04-05 ENCOUNTER — Other Ambulatory Visit: Payer: Self-pay | Admitting: Internal Medicine

## 2018-04-05 DIAGNOSIS — G4700 Insomnia, unspecified: Secondary | ICD-10-CM

## 2018-04-05 DIAGNOSIS — C569 Malignant neoplasm of unspecified ovary: Secondary | ICD-10-CM

## 2018-04-09 ENCOUNTER — Inpatient Hospital Stay (HOSPITAL_BASED_OUTPATIENT_CLINIC_OR_DEPARTMENT_OTHER): Payer: PPO | Admitting: Internal Medicine

## 2018-04-09 ENCOUNTER — Other Ambulatory Visit: Payer: Self-pay

## 2018-04-09 ENCOUNTER — Inpatient Hospital Stay: Payer: PPO

## 2018-04-09 VITALS — BP 120/84 | HR 70 | Temp 97.8°F | Resp 18 | Ht 64.0 in | Wt 189.0 lb

## 2018-04-09 DIAGNOSIS — C569 Malignant neoplasm of unspecified ovary: Secondary | ICD-10-CM

## 2018-04-09 DIAGNOSIS — K5900 Constipation, unspecified: Secondary | ICD-10-CM | POA: Diagnosis not present

## 2018-04-09 DIAGNOSIS — R5383 Other fatigue: Secondary | ICD-10-CM

## 2018-04-09 DIAGNOSIS — Z5111 Encounter for antineoplastic chemotherapy: Secondary | ICD-10-CM | POA: Diagnosis not present

## 2018-04-09 DIAGNOSIS — C561 Malignant neoplasm of right ovary: Secondary | ICD-10-CM | POA: Diagnosis not present

## 2018-04-09 LAB — URINALYSIS, COMPLETE (UACMP) WITH MICROSCOPIC
BACTERIA UA: NONE SEEN
Bilirubin Urine: NEGATIVE
Glucose, UA: NEGATIVE mg/dL
Hgb urine dipstick: NEGATIVE
Ketones, ur: NEGATIVE mg/dL
Leukocytes, UA: NEGATIVE
NITRITE: NEGATIVE
Protein, ur: NEGATIVE mg/dL
SPECIFIC GRAVITY, URINE: 1.012 (ref 1.005–1.030)
pH: 6 (ref 5.0–8.0)

## 2018-04-09 LAB — COMPREHENSIVE METABOLIC PANEL
ALBUMIN: 3.5 g/dL (ref 3.5–5.0)
ALT: 12 U/L (ref 0–44)
ANION GAP: 5 (ref 5–15)
AST: 22 U/L (ref 15–41)
Alkaline Phosphatase: 76 U/L (ref 38–126)
BILIRUBIN TOTAL: 0.6 mg/dL (ref 0.3–1.2)
BUN: 20 mg/dL (ref 8–23)
CALCIUM: 9.1 mg/dL (ref 8.9–10.3)
CO2: 23 mmol/L (ref 22–32)
CREATININE: 1.1 mg/dL — AB (ref 0.44–1.00)
Chloride: 109 mmol/L (ref 98–111)
GFR calc Af Amer: 56 mL/min — ABNORMAL LOW (ref >60)
GFR calc non Af Amer: 48 mL/min — ABNORMAL LOW (ref >60)
GLUCOSE: 103 mg/dL — AB (ref 70–99)
Potassium: 4 mmol/L (ref 3.5–5.1)
SODIUM: 137 mmol/L (ref 135–145)
TOTAL PROTEIN: 6.6 g/dL (ref 6.5–8.1)

## 2018-04-09 LAB — CBC WITH DIFFERENTIAL/PLATELET
BASOS ABS: 0.1 10*3/uL (ref 0–0.1)
BASOS PCT: 1 %
EOS PCT: 2 %
Eosinophils Absolute: 0.1 10*3/uL (ref 0–0.7)
HEMATOCRIT: 36.2 % (ref 35.0–47.0)
Hemoglobin: 12.3 g/dL (ref 12.0–16.0)
Lymphocytes Relative: 44 %
Lymphs Abs: 2.2 10*3/uL (ref 1.0–3.6)
MCH: 34.9 pg — ABNORMAL HIGH (ref 26.0–34.0)
MCHC: 34 g/dL (ref 32.0–36.0)
MCV: 102.6 fL — ABNORMAL HIGH (ref 80.0–100.0)
MONO ABS: 0.6 10*3/uL (ref 0.2–0.9)
MONOS PCT: 11 %
NEUTROS ABS: 2.1 10*3/uL (ref 1.4–6.5)
Neutrophils Relative %: 42 %
PLATELETS: 248 10*3/uL (ref 150–440)
RBC: 3.53 MIL/uL — ABNORMAL LOW (ref 3.80–5.20)
RDW: 20.5 % — AB (ref 11.5–14.5)
WBC: 5 10*3/uL (ref 3.6–11.0)

## 2018-04-09 MED ORDER — PALONOSETRON HCL INJECTION 0.25 MG/5ML
0.2500 mg | Freq: Once | INTRAVENOUS | Status: AC
  Administered 2018-04-09: 0.25 mg via INTRAVENOUS
  Filled 2018-04-09: qty 5

## 2018-04-09 MED ORDER — HEPARIN SOD (PORK) LOCK FLUSH 100 UNIT/ML IV SOLN
500.0000 [IU] | Freq: Once | INTRAVENOUS | Status: AC
  Administered 2018-04-09: 500 [IU] via INTRAVENOUS
  Filled 2018-04-09: qty 5

## 2018-04-09 MED ORDER — SODIUM CHLORIDE 0.9 % IV SOLN: 10.0000 mg | Freq: Once | INTRAVENOUS | Status: DC

## 2018-04-09 MED ORDER — SODIUM CHLORIDE 0.9 % IV SOLN
Freq: Once | INTRAVENOUS | Status: AC
  Administered 2018-04-09: 11:00:00 via INTRAVENOUS
  Filled 2018-04-09: qty 250

## 2018-04-09 MED ORDER — SODIUM CHLORIDE 0.9% FLUSH
10.0000 mL | Freq: Once | INTRAVENOUS | Status: AC
  Administered 2018-04-09: 10 mL via INTRAVENOUS
  Filled 2018-04-09: qty 10

## 2018-04-09 MED ORDER — SODIUM CHLORIDE 0.9 % IV SOLN
430.5000 mg | Freq: Once | INTRAVENOUS | Status: AC
  Administered 2018-04-09: 430 mg via INTRAVENOUS
  Filled 2018-04-09: qty 43

## 2018-04-09 MED ORDER — SODIUM CHLORIDE 0.9 % IV SOLN
2000.0000 mg | Freq: Once | INTRAVENOUS | Status: AC
  Administered 2018-04-09: 2000 mg via INTRAVENOUS
  Filled 2018-04-09: qty 53
  Filled 2018-04-09: qty 52.6

## 2018-04-09 MED ORDER — DEXAMETHASONE SODIUM PHOSPHATE 10 MG/ML IJ SOLN
10.0000 mg | Freq: Once | INTRAMUSCULAR | Status: AC
  Administered 2018-04-09: 10 mg via INTRAVENOUS
  Filled 2018-04-09: qty 1

## 2018-04-09 NOTE — Progress Notes (Signed)
[facility] OFFICE PROGRESS NOTE  Patient Care Team: Jerrol Banana., MD as PCP - General (Family Medicine) Gillis Ends, MD as Referring Physician (Obstetrics and Gynecology) Cammie Sickle, MD as Consulting Physician (Oncology) Corey Skains, MD as Consulting Physician (Cardiology) Marval Regal, NP as Nurse Practitioner (Nurse Practitioner) Manya Silvas, MD as Consulting Physician (Gastroenterology)  Cancer Staging Malignant neoplasm of ovary [facility]) Staging form: Ovary, AJCC 7th Edition - Clinical: Stage IIIC (T3c, N0, M0) - Unsigned    Oncology History   # April- MAY 2016- HIGH GRADE SEROUS OVARIAN CANCER STAGE IIIC; CA-125 +2300+;  s/p OPTIMAL DEBULKING SURGERY   # MAY 2016[name] DD [finished in Sep 19th 2017]  # MAY CT 2017- RECURRENT/Peritoneal carcinomatosis/pelvic implant ~1.2cm/Ca-125-34;   # July 3rd 2017- CARBO-DOXIL q 4W [with neulasta]; CT May 11 2016- slight increase peritoneal / stable nodules. Cont chemo [finished DEC 2017]. Last dose CARBO-07/11/2016; DEC 28th 2017 CT- Increase in peritoneal deposits; increasing Ca 125 [60s]- PLATINUM REFRACTORY  # Jan 2018- Taxol-Avastin x6 cycles; Aug 2018- CR; Aug 2018- start Avastin Maintenance; MARCH 2019- PROGRESSION- STOP avastin.   # MID April 2019- Zejula [300 mg/day]; 4 days- later STOPPED sec to Chest pain;  # June 1st week- Lynparza    # May 2019- TAKASUBO ? [s/p cardiac cath- Dr.Kowalski]  # G-1-2 hand foot syndrome-   # LEFT BREAST CA s/p Lumpec & RT s/p TAM   # Afib [cardiology]; JUNE 2017- MUGA 62%  Jan 2018- MOLECULAR TESTING- ATM DELETERIOUS HETEROZYGOUS mutation; TUMOR BRCA- NEG; Myriad genomic instability- NEGATIVE;   # NGS/Omniseq- BRCA-2 copy loss/ * [april2019]; NEG- BRCA Mutations/N-TRK/MSI-STABLE.  ---------------------------------------------------------------------    DIAGNOSIS: MAY 2017; RECURRENT OVARIAN CA-high-grade  serous/platinum refractory  STAGE: IV        ;GOALS: PALLIATIVE  CURRENT/MOST RECENT THERAPY: June 2nd week  Lynparza      Malignant neoplasm of ovary ([place])   04/08/2018 -  Chemotherapy    The patient had palonosetron (ALOXI) injection 0.25 mg, 0.25 mg, Intravenous,  Once, 1 of 4 cycles pegfilgrastim-cbqv (UDENYCA) injection 6 mg, 6 mg, Subcutaneous, Once, 1 of 4 cycles CARBOplatin (PARAPLATIN) 430 mg in sodium chloride 0.9 % 250 mL chemo infusion, 430 mg (100 % of original dose 430.5 mg), Intravenous,  Once, 1 of 4 cycles Dose modification:   (original dose 430.5 mg, Cycle 1) gemcitabine (GEMZAR) 1,976 mg in sodium chloride 0.9 % 100 mL chemo infusion, 1,000 mg/m2 = 1,976 mg, Intravenous,  Once, 1 of 4 cycles  for chemotherapy treatment.      Ovarian cancer, unspecified laterality ([place])      INTERVAL HISTORY:  Jamie Conner 77 y.o.  female pleasant patient above history of high-grade serous ovarian cancer noted to have recent progression on [name] is here to proceed with chemotherapy.  Patient denies any constipation.  Denies any worsening abdominal distention.  Mild fatigue.   Review of Systems  Constitutional: Positive for malaise/fatigue. Negative for chills, diaphoresis, fever and weight loss.  HENT: Negative for nosebleeds and sore throat.   Eyes: Negative for double vision.  Respiratory: Negative for cough, hemoptysis, sputum production, shortness of breath and wheezing.   Cardiovascular: Negative for chest pain, palpitations, orthopnea and leg swelling.  Gastrointestinal: Negative for abdominal pain, blood in stool, constipation, diarrhea, heartburn, melena, nausea and vomiting.  Genitourinary: Negative for dysuria, frequency and urgency.  Musculoskeletal: Negative for back pain and joint pain.  Skin: Negative.  Negative for itching and rash.  Neurological: Negative for dizziness, tingling, focal weakness, weakness and headaches.  Endo/Heme/Allergies: Does not  bruise/bleed easily.  Psychiatric/Behavioral: Negative for depression. The patient is not nervous/anxious and does not have insomnia.       PAST MEDICAL HISTORY :  Past Medical History:  Diagnosis Date  . Atrial fibrillation ([place])   . Breast cancer ([place]) 1998  . Cancer of breast ([place]) 1998   Left/radiation  . CINV (chemotherapy-induced nausea and vomiting)   . GERD (gastroesophageal reflux disease)   . Hyperlipidemia   . Hypothyroidism   . Mitral insufficiency   . Ovarian cancer ([place])    s/p BSO optimal tumor debulking May 2016/chemo  . Personal history of chemotherapy since 2016   ovarian cancer  . Personal history of radiation therapy 1998  . PVC (premature ventricular contraction)     PAST SURGICAL HISTORY :   Past Surgical History:  Procedure Laterality Date  . ABDOMINAL HYSTERECTOMY    . BILATERAL SALPINGOOPHORECTOMY  May 2016   with optimal tumor debulking   . BREAST LUMPECTOMY Left 1998  . BREAST SURGERY     Left  . CESAREAN SECTION     x2  . CHOLECYSTECTOMY    . COLONOSCOPY  2006  . DEBULKING N/A 12/22/2014   Procedure: DEBULKING;  Surgeon: Gillis Ends, MD;  Location: [facility] ORS;  Service: Gynecology;  Laterality: N/A;  . LAPAROTOMY N/A 12/22/2014   Procedure: EXPLORATORY LAPAROTOMY;  Surgeon: Robert Bellow, MD;  Location: [facility] ORS;  Service: General;  Laterality: N/A;  . LAPAROTOMY WITH STAGING N/A 12/22/2014   Procedure: LAPAROTOMY WITH STAGING;  Surgeon: Gillis Ends, MD;  Location: [facility] OR[facility];  Service: Gynecology;  Laterality: N/A;  . LEFT HEART CATH AND CORONARY ANGIOGRAPHY N/A 12/10/2017   Procedure: LEFT HEART CATH AND CORONARY ANGIOGRAPHY;  Surgeon: Yolonda Kida, MD;  Location: [facility];  Service: Cardiovascular;  Laterality: N/A;  . PERIPHERAL VASCULAR CATHETERIZATION Left 12/22/2014   Procedure: PORTA CATH INSERTION;  Surgeon: Robert Bellow, MD;  Location: [facility] OR[facility];  Service: General;  Laterality: Left;  . PORTACATH  PLACEMENT Right 12/22/2014   Procedure: INSERTION PORT-A-CATH;  Surgeon: Robert Bellow, MD;  Location: [facility] OR[facility];  Service: General;  Laterality: Right;  . SALPINGOOPHORECTOMY Bilateral 12/22/2014   Procedure: SALPINGO OOPHORECTOMY;  Surgeon: Gillis Ends, MD;  Location: [facility] OR[facility];  Service: Gynecology;  Laterality: Bilateral;  . UPPER GI ENDOSCOPY  09/25/04   hiatus hernia, schatzki ring and a single gastric polyp    FAMILY HISTORY :   Family History  Problem Relation Age of Onset  . Cancer Mother        breast, throat, and stomach  . Breast cancer Mother   . Heart disease Father   . Pulmonary embolism Sister   . Cancer Brother 60       angiosarcoma of the chest; carcinoid small intestinal tumor  . Hypertension Brother   . Stroke Brother   . Cancer Maternal Aunt        breast cancer  . Cancer Maternal Grandfather 59       pancreatic    SOCIAL HISTORY:   Social History   Tobacco Use  . Smoking status: Never Smoker  . Smokeless tobacco: Never Used  Substance Use Topics  . Alcohol use: No  . Drug use: No    ALLERGIES:  is allergic to carafate [sucralfate] and sulfa antibiotics.  MEDICATIONS:  Current Outpatient Medications  Medication Sig Dispense Refill  . alum &  mag hydroxide-simeth (MAALOX/MYLANTA) 200-200-20 MG/5ML suspension Take 15 mLs by mouth every 6 (six) hours as needed for indigestion or heartburn. 355 mL 0  . aspirin EC 325 MG tablet Take 1 tablet (325 mg total) by mouth daily. 100 tablet 3  . Cholecalciferol (VITAMIN D) 2000 units tablet Take 1 tablet (2,000 Units total) by mouth daily. 30 tablet 12  . Docosanol (ABREVA EX) Apply 1 application topically as needed. Fever blisters    . fluticasone (FLONASE) 50 MCG/ACT nasal spray Place 1 spray into both nostrils daily.    Mar[place] Kitchen levothyroxine (SYNTHROID, LEVOTHROID) 50 MCG tablet TAKE ONE TABLET ON AN EMPTY STOMACH WITHA GLASS OF WATER AT LEAST 30 TO 60 MINUTES BEFORE BREAKFAST 30 tablet 12  .  lidocaine-prilocaine (EMLA) cream Apply to port area 30-45 mins prior to chemo. 30 g 5  . loratadine (CLARITIN) 10 MG tablet Take 10 mg by mouth daily.    Marland Kitchen LORazepam (ATIVAN) 0.5 MG tablet 1 tablet at night as needed for anxiety/sleep 30 tablet 1  . Lysine 500 MG TABS Take 1 tablet by mouth daily as needed.     . metoprolol succinate (TOPROL-XL) 25 MG 24 hr tablet Take 1 tablet by mouth daily.    . Multiple Vitamin (MULTIVITAMIN) capsule Take 1 capsule by mouth daily.    Marland Kitchen acetaminophen (TYLENOL) 500 MG tablet Take 500 mg by mouth every 6 (six) hours as needed.    . furosemide (LASIX) 20 MG tablet Take 0.5 tablets (10 mg total) by mouth daily. (Patient not taking: Reported on 04/09/2018) 30 tablet 0  . lactulose (CHRONULAC) 10 GM/15ML solution Take 15 mLs (10 g total) by mouth daily as needed for moderate constipation. (Patient not taking: Reported on 04/09/2018) 236 mL 0  . ondansetron (ZOFRAN) 4 MG tablet One pill 30-60 mins prior to taking zejula to prevent nausea/vomitting. (Patient not taking: Reported on 04/09/2018) 45 tablet 3  . ondansetron (ZOFRAN) 8 MG tablet Take 1 tablet (8 mg total) by mouth every 8 (eight) hours as needed for nausea or vomiting. (Patient not taking: Reported on 04/09/2018) 20 tablet 4  . pantoprazole (PROTONIX) 40 MG tablet Take 1 tablet (40 mg total) by mouth daily. (Patient not taking: Reported on 04/09/2018) 30 tablet 1  . polyethylene glycol (MIRALAX / GLYCOLAX) packet Take 17 g by mouth daily as needed.    . prochlorperazine (COMPAZINE) 10 MG tablet Take 1 tablet (10 mg total) by mouth every 6 (six) hours as needed for nausea or vomiting. (Patient not taking: Reported on 04/09/2018) 30 tablet 4  . traMADol (ULTRAM) 50 MG tablet Take 1 tablet (50 mg total) by mouth every 6 (six) hours as needed. (Patient not taking: Reported on 04/09/2018) 30 tablet 0   No current facility-administered medications for this visit.    Facility-Administered Medications Ordered in Other  Visits  Medication Dose Route Frequency Provider Last Rate Last Dose  . heparin lock flush 100 unit/mL  500 Units Intravenous Once Cammie Sickle, MD        PHYSICAL EXAMINATION: ECOG PERFORMANCE STATUS: 1 - Symptomatic but completely ambulatory  BP 120/84   Pulse 70   Temp 97.8 F (36.6 C) (Tympanic)   Resp 18   Ht '5\' 4"'$  (1.626 m)   Wt 189 lb (85.7 kg)   BMI 32.44 kg/m   Filed Weights   04/09/18 0947  Weight: 189 lb (85.7 kg)    Physical Exam  Constitutional: She is oriented to person, place, and time and well-developed,  well-nourished, and in no distress.  Accompanied by husband.  She is walking by herself.  HENT:  Head: Normocephalic and atraumatic.  Mouth/Throat: Oropharynx is clear and moist. No oropharyngeal exudate.  Eyes: Pupils are equal, round, and reactive to light.  Neck: Normal range of motion. Neck supple.  Cardiovascular: Normal rate and regular rhythm.  Pulmonary/Chest: No respiratory distress. She has no wheezes.  Abdominal: Soft. Bowel sounds are normal. She exhibits no distension and no mass. There is no tenderness. There is no rebound and no guarding.  Musculoskeletal: Normal range of motion. She exhibits no edema or tenderness.  Neurological: She is alert and oriented to person, place, and time.  Skin: Skin is warm.  Psychiatric: Affect normal.       LABORATORY DATA:  I have reviewed the data as listed    Component Value Date/Time   NA 137 04/09/2018 0919   NA 140 12/14/2014 1037   K 4.0 04/09/2018 0919   K 4.4 12/14/2014 1037   CL 109 04/09/2018 0919   CL 106 12/14/2014 1037   CO2 23 04/09/2018 0919   CO2 26 12/14/2014 1037   GLUCOSE 103 (H) 04/09/2018 0919   GLUCOSE 87 12/14/2014 1037   BUN 20 04/09/2018 0919   BUN 12 12/14/2014 1037   CREATININE 1.10 (H) 04/09/2018 0919   CREATININE 0.97 12/14/2014 1037   CALCIUM 9.1 04/09/2018 0919   CALCIUM 9.2 12/14/2014 1037   PROT 6.6 04/09/2018 0919   PROT 7.3 12/14/2014 1037    ALBUMIN 3.5 04/09/2018 0919   ALBUMIN 3.8 12/14/2014 1037   AST 22 04/09/2018 0919   AST 20 12/14/2014 1037   ALT 12 04/09/2018 0919   ALT 11 (L) 12/14/2014 1037   ALKPHOS 76 04/09/2018 0919   ALKPHOS 138 (H) 12/14/2014 1037   BILITOT 0.6 04/09/2018 0919   BILITOT 0.5 12/14/2014 1037   GFRNONAA 48 (L) 04/09/2018 0919   GFRNONAA 59 (L) 12/14/2014 1037   GFRAA 56 (L) 04/09/2018 0919   GFRAA >60 12/14/2014 1037    No results found for: SPEP, UPEP  Lab Results  Component Value Date   WBC 5.0 04/09/2018   NEUTROABS 2.1 04/09/2018   HGB 12.3 04/09/2018   HCT 36.2 04/09/2018   MCV 102.6 (H) 04/09/2018   PLT 248 04/09/2018      Chemistry      Component Value Date/Time   NA 137 04/09/2018 0919   NA 140 12/14/2014 1037   K 4.0 04/09/2018 0919   K 4.4 12/14/2014 1037   CL 109 04/09/2018 0919   CL 106 12/14/2014 1037   CO2 23 04/09/2018 0919   CO2 26 12/14/2014 1037   BUN 20 04/09/2018 0919   BUN 12 12/14/2014 1037   CREATININE 1.10 (H) 04/09/2018 0919   CREATININE 0.97 12/14/2014 1037   GLU 84 03/16/2014      Component Value Date/Time   CALCIUM 9.1 04/09/2018 0919   CALCIUM 9.2 12/14/2014 1037   ALKPHOS 76 04/09/2018 0919   ALKPHOS 138 (H) 12/14/2014 1037   AST 22 04/09/2018 0919   AST 20 12/14/2014 1037   ALT 12 04/09/2018 0919   ALT 11 (L) 12/14/2014 1037   BILITOT 0.6 04/09/2018 0919   BILITOT 0.5 12/14/2014 1037       RADIOGRAPHIC STUDIES: I have personally reviewed the radiological images as listed and agreed with the findings in the report. No results found.   ASSESSMENT & PLAN:  Ovarian cancer, unspecified laterality ([place]) # RECURRENT HIGH GRADE SEROUS ovarian  cancer; currently on [place]. AUG 2019-CT abdomen pelvis shows progression-peritoneal/serosal.  Ca125-rising.  Discontinue Lynparza.  Worsen.  #Patient's last platinum was approximately 2 years ago; recommend gemcitabine carboplatin.   # Proceed with carbo-Gem cycle #1; day 1 today. Labs today  reviewed;  acceptable for treatment today.   #Again reviewed the potential side effects of gemcitabine including but not limited to skin rash leg swelling fevers.  #Constipation stable.  #Fatigue-multifactorial stable.  # 1 week-cbc-GEM; in 3 weeks-CBC/CMP/Ca-125/MD-GEM-Carbo; Neulasta day -9.    No orders of the defined types were placed in this encounter.  All questions were answered. The patient knows to call the clinic with any problems, questions or concerns.      Cammie Sickle, MD 04/09/2018 12:55 PM

## 2018-04-09 NOTE — Assessment & Plan Note (Addendum)
#   RECURRENT HIGH GRADE SEROUS ovarian cancer; currently on [place]. AUG 2019-CT abdomen pelvis shows progression-peritoneal/serosal.  Ca125-rising.  Discontinue Lynparza.  Worsen.  #Patient's last platinum was approximately 2 years ago; recommend gemcitabine carboplatin.   # Proceed with carbo-Gem cycle #1; day 1 today. Labs today reviewed;  acceptable for treatment today.   #Again reviewed the potential side effects of gemcitabine including but not limited to skin rash leg swelling fevers.  #Constipation stable.  #Fatigue-multifactorial stable.  # 1 week-cbc-GEM; in 3 weeks-CBC/CMP/Ca-125/MD-GEM-Carbo; Neulasta day -9.

## 2018-04-10 LAB — CA 125: CANCER ANTIGEN (CA) 125: 234 U/mL — AB (ref 0.0–38.1)

## 2018-04-16 ENCOUNTER — Inpatient Hospital Stay: Payer: PPO

## 2018-04-16 ENCOUNTER — Inpatient Hospital Stay (HOSPITAL_BASED_OUTPATIENT_CLINIC_OR_DEPARTMENT_OTHER): Payer: PPO | Admitting: Obstetrics and Gynecology

## 2018-04-16 VITALS — BP 112/77 | HR 63 | Temp 97.6°F | Resp 18 | Ht 64.0 in | Wt 190.7 lb

## 2018-04-16 DIAGNOSIS — Z90710 Acquired absence of both cervix and uterus: Secondary | ICD-10-CM | POA: Diagnosis not present

## 2018-04-16 DIAGNOSIS — Z5111 Encounter for antineoplastic chemotherapy: Secondary | ICD-10-CM | POA: Diagnosis not present

## 2018-04-16 DIAGNOSIS — Z90722 Acquired absence of ovaries, bilateral: Secondary | ICD-10-CM

## 2018-04-16 DIAGNOSIS — C569 Malignant neoplasm of unspecified ovary: Secondary | ICD-10-CM | POA: Diagnosis not present

## 2018-04-16 LAB — CBC WITH DIFFERENTIAL/PLATELET
BASOS PCT: 1 %
Basophils Absolute: 0 10*3/uL (ref 0–0.1)
Eosinophils Absolute: 0 10*3/uL (ref 0–0.7)
Eosinophils Relative: 2 %
HEMATOCRIT: 31.6 % — AB (ref 35.0–47.0)
Hemoglobin: 10.7 g/dL — ABNORMAL LOW (ref 12.0–16.0)
Lymphocytes Relative: 64 %
Lymphs Abs: 1.8 10*3/uL (ref 1.0–3.6)
MCH: 35.2 pg — ABNORMAL HIGH (ref 26.0–34.0)
MCHC: 34 g/dL (ref 32.0–36.0)
MCV: 103.7 fL — AB (ref 80.0–100.0)
MONO ABS: 0.1 10*3/uL — AB (ref 0.2–0.9)
MONOS PCT: 5 %
NEUTROS ABS: 0.8 10*3/uL — AB (ref 1.4–6.5)
Neutrophils Relative %: 28 %
Platelets: 142 10*3/uL — ABNORMAL LOW (ref 150–440)
RBC: 3.05 MIL/uL — ABNORMAL LOW (ref 3.80–5.20)
RDW: 19.1 % — ABNORMAL HIGH (ref 11.5–14.5)
WBC: 2.7 10*3/uL — ABNORMAL LOW (ref 3.6–11.0)

## 2018-04-16 LAB — COMPREHENSIVE METABOLIC PANEL
ALBUMIN: 3.5 g/dL (ref 3.5–5.0)
ALK PHOS: 79 U/L (ref 38–126)
ALT: 23 U/L (ref 0–44)
ANION GAP: 9 (ref 5–15)
AST: 33 U/L (ref 15–41)
BUN: 20 mg/dL (ref 8–23)
CALCIUM: 9.3 mg/dL (ref 8.9–10.3)
CO2: 24 mmol/L (ref 22–32)
CREATININE: 0.99 mg/dL (ref 0.44–1.00)
Chloride: 108 mmol/L (ref 98–111)
GFR calc Af Amer: >60 mL/min (ref >60)
GFR calc non Af Amer: 55 mL/min — ABNORMAL LOW (ref >60)
GLUCOSE: 104 mg/dL — AB (ref 70–99)
Potassium: 3.8 mmol/L (ref 3.5–5.1)
SODIUM: 141 mmol/L (ref 135–145)
Total Bilirubin: 0.6 mg/dL (ref 0.3–1.2)
Total Protein: 6.3 g/dL — ABNORMAL LOW (ref 6.5–8.1)

## 2018-04-16 MED ORDER — HEPARIN SOD (PORK) LOCK FLUSH 100 UNIT/ML IV SOLN
500.0000 [IU] | Freq: Once | INTRAVENOUS | Status: AC
  Administered 2018-04-16: 500 [IU] via INTRAVENOUS

## 2018-04-16 MED ORDER — HEPARIN SOD (PORK) LOCK FLUSH 100 UNIT/ML IV SOLN
INTRAVENOUS | Status: AC
  Filled 2018-04-16: qty 5

## 2018-04-16 MED ORDER — SODIUM CHLORIDE 0.9% FLUSH
10.0000 mL | Freq: Once | INTRAVENOUS | Status: AC
  Administered 2018-04-16: 10 mL via INTRAVENOUS
  Filled 2018-04-16: qty 10

## 2018-04-16 NOTE — Progress Notes (Signed)
No gyn concerns, just reflux indigestion sx

## 2018-04-16 NOTE — Progress Notes (Signed)
Gynecologic Oncology Interval Visit   Referring Provider: Blain Pais, MD.  Chief Concern: Platinum-resistant recurrent stage IIIc ovarian cancer  Subjective:  Jamie Conner is a 77 y.o. female who is seen for platinum-resistant recurrent stage IIIc ovarian cancer.   She was recently seen by Dr. Rogue Bussing on 04/09/2018. She had been on  maintenance bevacizumab until MARCH 2019 when she was diagnosed with PROGRESSION. She was started on PARPi.  April 2019- niraparib [300 mg/day]; 4 days- later STOPPED sec to Chest pain/cardiac spasm; June 1st week-  Switched to olaparib    She was diagnosed with progression based on CT scan and proceeded with carbo-Gem cycle #1; day 1 on 04/09/2018.    Ref Range & Units 7d ago 3wk ago 33moago 35mogo 22m822moo  Cancer Antigen (CA) 125 0.0 - 38.1 U/mL 234.0High   181.0High  CM 133.2High  CM 90.4High  CM 33.4     Her main symptoms are fatigue, weakness, indigestion/reflex at night, one episode of nausea after gemcitabine, and constipation that she manages very well with Miralax.     Since her last visit she had the following imaging studies:  10/23/2017 CT Abdomen/pelvis IMPRESSION: 1. There is apparent increase soft tissue thickening around the cecal base which appears new from previous exam. Although nonspecific and can occur for variety of reasons including incomplete distention, serosal deposits reflecting peritoneal disease cannot be excluded. Consider further evaluation with PET-CT. 2. New asymmetric soft tissue thickening along the left side of vaginal cuff. 3. Aortic atherosclerosis  10/23/2017 MRI brain IMPRESSION: No acute brain finding. Mild to moderate chronic small-vessel change of the deep white matter. No sign of metastatic disease to the brain. Sclerotic focus within the C3 vertebral body which is nonspecific. Metastatic disease to bone not excluded given the history breast cancer.  10/23/2017 PET scan  IMPRESSION: 1.  Multiple small hypermetabolic foci of tumor observed along the bowel and pelvic mesentery, as well as the vaginal cuff, compatible with early spread of ovarian cancer. 2. No findings of involvement of the thorax, neck, or skeleton. 3. Diffuse mild thyroid activity favoring mild thyroiditis. 4. Other imaging findings of potential clinical significance: Small type 1 hiatal hernia. Mild cardiomegaly. Aortic Atherosclerosis (ICD10-I70.0). Scattered sigmoid colon diverticula. Posterior right hepatic lobe hemangioma.  12/11/2017 CT chest scan  IMPRESSION: 1. No evidence of pulmonary embolus. 2. No acute cardiopulmonary disease. 3. Aortic Atherosclerosis (ICD10-I70.0).   03/28/2018 CT C/A/P and neck IMPRESSION: 1. Progressive multifocal peritoneal metastatic disease. No significant ascites. Several of the lesions are associated with the colonic serosa, including one which protrudes into the lumen of the distal sigmoid colon. No evidence of bowel obstruction at this time. 2. No evidence of solid visceral organ metastasis. No thoracic metastatic disease identified. 3. Stable hepatic hemangioma. 4.  Aortic Atherosclerosis (ICD10-I70.0).  No metastatic disease in the neck.   She presents today for a pelvic exam.    Gynecologic Oncology History Jamie Conner a pleasant female who is seen for postoperative visit for stage IIIc high-grade serous ovarian cancer. See prior notes for complete detail.  She also has a history of  carcinoma of breast ( left) status post lumpectomy , radiation therapy and 5 years of tamoxifen. She underwent exploratory Laparotomy, bilateral salpingo-oophorectomy, right ureterolysis, infragastric omentectomy, and optimally debulked to no gross residual  May 4th, 2016. Preop CA125 2354.0.  She started chemotherapy with carboplatinum and Taxol in dose dense fashion from Jan 02, 2015. She underwent 6 cycles of chemotherapy  completed 05/09/2015. Her dose of  Taxol was reduced because of myelosuppression and fever in spite of Neulasta.  [place] CA125 = 9.8  06/06/2015 CT scan abdomen and pelvis IMPRESSION: 1. Interval removal of the large right ovarian mass . Dramatic improvement in the appearance of mesenteric and omental implants. There is a 3 mm potential faint omental nodule at about the level of the umbilicus but for the most part the numerous prior omental and mesenteric tumor implants have resolved completely. 2. 1.8 by 1.2 cm fluid density structure along the splenic hilum, no change from prior, likely a small benign cystic lesion.  3. Other imaging findings of potential clinical significance: 3 cm periampullary duodenal diverticulum.   01/04/16 CA125 34.1  01/10/2016 CT scan chest abdomen and pelvis. 1. New nodular density 12 x 7 mm ties are seen throughout the pelvis. The largest measures 12 x 10 mm best seen on image number 105 of series. A 9 mm nodule is seen in the left pelvis best seen on image number 60 of series 5. These are concerning for possible peritoneal implants. 2. Stable 1.9 cm cystic lesion seen in splenic hilum.  Vaginal biopsy performed on 01/04/2016 negative  She was started on PLD and carboplatin 02/20/2016  05/11/2016 CT scan abdomen and pelvis IMPRESSION: 1. No acute findings identified within the abdomen or pelvis. 2. Peritoneal nodules within the right side of pelvis are stable the slightly increased in size from previous exam. No new areas of disease identified.  She has completed 6 cycles of  PLD and carboplatin therapy and imaging revealed progressive disease.   08/15/2016 CT scan abdomen and pelvis 1. Study demonstrates slight interval growth of several peritoneal implants in the low anatomic pelvis indicative of slight progression of disease. No other new peritoneal deposits are noted, and there is no other metastatic disease noted elsewhere in the chest, abdomen or pelvis. 2. Colonic diverticulosis without  evidence of acute diverticulitis at this time. 3. Aortic atherosclerosis, in addition to left main coronary artery disease. Assessment for potential risk factor modification, dietary therapy or pharmacologic therapy may be warranted, if clinically indicated. 4. Additional incidental findings, as above.  Rucaparib denied by insurance.   09/24/2016 She was started on paclitaxel and bevacizumab and received 6 cycles of paclitaxel and bevacizumab 03/04/2017 with evidence of response  CT Abdomen and Pelvis 03/28/2017 IMPRESSION: 1. Status post hysterectomy, without recurrent or metastatic disease. 2.  Possible constipation. 3.  Tiny hiatal hernia. 4. Hepatic hemangiomas. 5. Cystic lesion within the right-side of the vagina is similar over prior exams and may represent a Bartholin's gland cyst.\  MRI pelvis on 07/09/2017 No evidence of recurrent or metastatic carcinoma within the pelvis. Stable 1.5 cm right vaginal wall cyst, consistent with a benign Gardner's duct or Bartholin's gland cyst.  MRI lumbar spine on 07/09/2017 Negative for metastatic disease. Mild lumbar degenerative change without neural impingement.  Continued on maintenance bevacizumab since August 2018 until March 2019.    CA125 02/20/2016 47.5 03/21/2016 31.1 05/16/2016 37.5 06/13/2016 44.8 06/27/2016 50.4 08/15/2016 64.5 09/24/2016 101.7 10/22/2016 18.2 11/05/2016 13. 0 11/26/2016 12.6 01/21/2017 12.8 02/25/2017 13.3 04/01/2017 14.9 05/13/2017 14.3 06/24/2017 13.9 08/21/2017 19.6 12/13/2017 33.4 01/10/2018 90.4 02/14/2018 133.2 03/20/2018 181.0 04/09/2018 234    Genetic testing: ATM mutation [id number].  HRD testing negative ([facility])  I spoke with Dr. Lisbeth Ply at [facility] and he recommended genetic counseling for the patient and possibly other family members. He provided a phone number for them to call to schedule  that appointment. The number is [phone]. Of note her daughter has tested positive for an ATM mutation carrier  and she requested recommendations for who to see at [facility].    Problem List: Patient Active Problem List   Diagnosis Date Noted  . Chest pain 12/10/2017  . STEMI (ST elevation myocardial infarction) ([place]) 12/10/2017  . Ovarian cancer, unspecified laterality ([place]) 09/11/2017  . Counseling regarding goals of care 11/26/2016  . Encounter for monitoring cardiotoxic drug therapy 02/02/2016  . Breast cancer in female [facility]) 12/13/2015  . Peptic ulcer disease 12/12/2015  . DDD (degenerative disc disease), lumbosacral 12/12/2015  . Hyperlipidemia 12/12/2015  . Acute anxiety 12/12/2015  . Insomnia 12/12/2015  . Allergic rhinitis 12/12/2015  . GERD (gastroesophageal reflux disease) 12/12/2015  . Internal hemorrhoid 12/12/2015  . OA (osteoarthritis) 12/12/2015  . Osteopenia 12/12/2015  . Hypothyroid 04/11/2015  . Benign essential HTN 02/01/2015  . Retroperitoneal fibrosis   . Combined fat and carbohydrate induced hyperlipemia 01/04/2015  . Awareness of heartbeats 01/04/2015  . Beat, premature ventricular 01/04/2015  . Malignant neoplasm of ovary ([place]) 12/16/2014  . MI (mitral incompetence) 09/02/2014  . AF (paroxysmal atrial fibrillation) ([place]) 09/02/2014    Past Medical History: Past Medical History:  Diagnosis Date  . Atrial fibrillation ([place])   . Breast cancer ([place]) 1998  . Cancer of breast ([place]) 1998   Left/radiation  . CINV (chemotherapy-induced nausea and vomiting)   . GERD (gastroesophageal reflux disease)   . Hyperlipidemia   . Hypothyroidism   . Mitral insufficiency   . Ovarian cancer ([place])    s/p BSO optimal tumor debulking May 2016/chemo  . Personal history of chemotherapy since 2016   ovarian cancer  . Personal history of radiation therapy 1998  . PVC (premature ventricular contraction)     Past Surgical History: Past Surgical History:  Procedure Laterality Date  . ABDOMINAL HYSTERECTOMY    . BILATERAL SALPINGOOPHORECTOMY  May 2016   with optimal tumor  debulking   . BREAST LUMPECTOMY Left 1998  . BREAST SURGERY     Left  . CESAREAN SECTION     x2  . CHOLECYSTECTOMY    . COLONOSCOPY  2006  . DEBULKING N/A 12/22/2014   Procedure: DEBULKING;  Surgeon: Gillis Ends, MD;  Location: [facility] ORS;  Service: Gynecology;  Laterality: N/A;  . LAPAROTOMY N/A 12/22/2014   Procedure: EXPLORATORY LAPAROTOMY;  Surgeon: Robert Bellow, MD;  Location: [facility] OR[facility];  Service: General;  Laterality: N/A;  . LAPAROTOMY WITH STAGING N/A 12/22/2014   Procedure: LAPAROTOMY WITH STAGING;  Surgeon: Gillis Ends, MD;  Location: [facility];  Service: Gynecology;  Laterality: N/A;  . LEFT HEART CATH AND CORONARY ANGIOGRAPHY N/A 12/10/2017   Procedure: LEFT HEART CATH AND CORONARY ANGIOGRAPHY;  Surgeon: Yolonda Kida, MD;  Location: [facility];  Service: Cardiovascular;  Laterality: N/A;  . PERIPHERAL VASCULAR CATHETERIZATION Left 12/22/2014   Procedure: PORTA CATH INSERTION;  Surgeon: Robert Bellow, MD;  Location: [facility];  Service: General;  Laterality: Left;  . PORTACATH PLACEMENT Right 12/22/2014   Procedure: INSERTION PORT-A-CATH;  Surgeon: Robert Bellow, MD;  Location: [facility];  Service: General;  Laterality: Right;  . SALPINGOOPHORECTOMY Bilateral 12/22/2014   Procedure: SALPINGO OOPHORECTOMY;  Surgeon: Gillis Ends, MD;  Location: [facility];  Service: Gynecology;  Laterality: Bilateral;  . UPPER GI ENDOSCOPY  09/25/04   hiatus hernia, schatzki ring and a single gastric polyp    Past Gynecologic History:  See HPI  OB History:  OB History  Gravida Para Term Preterm AB Living  2         2  SAB TAB Ectopic Multiple Live Births               # Outcome Date GA Lbr Len/2nd Weight Sex Delivery Anes PTL Lv  2 Gravida           1 Gravida             Obstetric Comments  1st Menstrual Cycle:  14  1st Pregnancy:  89    Family History: Family History  Problem Relation Age of Onset  . Cancer Mother        breast, throat,  and stomach  . Breast cancer Mother   . Heart disease Father   . Pulmonary embolism Sister   . Cancer Brother 60       angiosarcoma of the chest; carcinoid small intestinal tumor  . Hypertension Brother   . Stroke Brother   . Cancer Maternal Aunt        breast cancer  . Cancer Maternal Grandfather 38       pancreatic    Social History: Social History   Socioeconomic History  . Marital status: Married    Spouse name: Not on file  . Number of children: 2  . Years of education: Not on file  . Highest education level: Bachelor's degree (e.g., BA, AB, BS)  Occupational History  . Occupation: retired  Scientific laboratory technician  . Financial resource strain: Not hard at all  . Food insecurity:    Worry: Never true    Inability: Never true  . Transportation needs:    Medical: No    Non-medical: No  Tobacco Use  . Smoking status: Never Smoker  . Smokeless tobacco: Never Used  Substance and Sexual Activity  . Alcohol use: No  . Drug use: No  . Sexual activity: Not Currently  Lifestyle  . Physical activity:    Days per week: Not on file    Minutes per session: Not on file  . Stress: To some extent  Relationships  . Social connections:    Talks on phone: Not on file    Gets together: Not on file    Attends religious service: Not on file    Active member of club or organization: Not on file    Attends meetings of clubs or organizations: Not on file    Relationship status: Not on file  . Intimate partner violence:    Fear of current or ex partner: Not on file    Emotionally abused: Not on file    Physically abused: Not on file    Forced sexual activity: Not on file  Other Topics Concern  . Not on file  Social History Narrative  . Not on file    Allergies: Allergies  Allergen Reactions  . Carafate [Sucralfate] Rash  . Sulfa Antibiotics Rash    Current Medications: Current Outpatient Medications  Medication Sig Dispense Refill  . acetaminophen (TYLENOL) 500 MG tablet Take  500 mg by mouth every 6 (six) hours as needed.    Marland Kitchen alum & mag hydroxide-simeth (MAALOX/MYLANTA) 200-200-20 MG/5ML suspension Take 15 mLs by mouth every 6 (six) hours as needed for indigestion or heartburn. 355 mL 0  . aspirin EC 325 MG tablet Take 1 tablet (325 mg total) by mouth daily. 100 tablet 3  . Cholecalciferol (VITAMIN D) 2000 units tablet Take 1 tablet (2,000 Units total)  by mouth daily. 30 tablet 12  . Docosanol (ABREVA EX) Apply 1 application topically as needed. Fever blisters    . fluticasone (FLONASE) 50 MCG/ACT nasal spray Place 1 spray into both nostrils daily as needed.     . lactulose (CHRONULAC) 10 GM/15ML solution Take 15 mLs (10 g total) by mouth daily as needed for moderate constipation. 236 mL 0  . levothyroxine (SYNTHROID, LEVOTHROID) 50 MCG tablet TAKE ONE TABLET ON AN EMPTY STOMACH WITHA GLASS OF WATER AT LEAST 30 TO 60 MINUTES BEFORE BREAKFAST 30 tablet 12  . lidocaine-prilocaine (EMLA) cream Apply to port area 30-45 mins prior to chemo. 30 g 5  . loratadine (CLARITIN) 10 MG tablet Take 10 mg by mouth daily as needed.     Marland Kitchen LORazepam (ATIVAN) 0.5 MG tablet 1 tablet at night as needed for anxiety/sleep 30 tablet 1  . Lysine 500 MG TABS Take 1 tablet by mouth daily as needed.     . metoprolol succinate (TOPROL-XL) 25 MG 24 hr tablet Take 1 tablet by mouth daily.    . Multiple Vitamin (MULTIVITAMIN) capsule Take 1 capsule by mouth daily.    . ondansetron (ZOFRAN) 4 MG tablet One pill 30-60 mins prior to taking zejula to prevent nausea/vomitting. 45 tablet 3  . ondansetron (ZOFRAN) 8 MG tablet Take 1 tablet (8 mg total) by mouth every 8 (eight) hours as needed for nausea or vomiting. 20 tablet 4  . pantoprazole (PROTONIX) 40 MG tablet Take 1 tablet (40 mg total) by mouth daily. 30 tablet 1  . polyethylene glycol (MIRALAX / GLYCOLAX) packet Take 17 g by mouth daily as needed.    . prochlorperazine (COMPAZINE) 10 MG tablet Take 1 tablet (10 mg total) by mouth every 6 (six)  hours as needed for nausea or vomiting. 30 tablet 4  . furosemide (LASIX) 20 MG tablet Take 0.5 tablets (10 mg total) by mouth daily. (Patient not taking: Reported on 04/09/2018) 30 tablet 0  . traMADol (ULTRAM) 50 MG tablet Take 1 tablet (50 mg total) by mouth every 6 (six) hours as needed. (Patient not taking: Reported on 04/09/2018) 30 tablet 0   No current facility-administered medications for this visit.    Facility-Administered Medications Ordered in Other Visits  Medication Dose Route Frequency Provider Last Rate Last Dose  . heparin lock flush 100 unit/mL  500 Units Intravenous Once Cammie Sickle, MD        Review of Systems General: fatigue  HEENT: negative  Lungs: negative for SOB or cough  Cardiac: no complaints  GI: using Miralax daily for constipation; nausea x 1 after chemotherapy; indigestion and reflex  GU: no complaints  Musculoskeletal: h/o  back pain and sciatica improved with PT  Extremities: no complaints  Skin: no complaints  Neuro: peripheral neuropathy, stable  Endocrine: no complaints  Psych: no complaints       Objective:  Physical Examination:  BP 112/77   Pulse 63   Temp 97.6 F (36.4 C) (Tympanic)   Resp 18   Ht _0  (1.626 m)   Wt 190 lb 11.2 oz (86.5 kg)   BMI 32.73 kg/m . Marland Kitchen    ECOG Performance Status: 1 - Symptomatic but completely ambulatory  General appearance: alert, cooperative and appears stated age HEENT: Atraumatic and normocephalic Lymph node survey: non-palpable inguinal and supraclavicular Cardiovascular: Regulatory rate and rhythm Respiratory: Bilateral clear to auscultation Abdomen: Soft nontender and nondistended. Incision all well healed. No hernias, no masses, no ascites.  Extremities:No edema  Skin exam - Well healed incisions.  Neurological exam reveals alert, oriented, normal speech, no focal findings or movement disorder noted  Pelvic: Pelvic exam; Chaperoned by nurse: Pelvic: Vulva: normal appearing vulva  with no masses, tenderness or lesions, atrophy; Vagina: normal left paravaginal cyst approximately mid portion of the vagina, no discharge, on BME the left paravaginal cyst was stable but there were 2 nodular masses at the vaginal cuff; Uterus and Cervix: surgically absent; Rectal: confirms. Approximately 2 cm and another 1.5 cm nodule involving vaginal cuff and separate from rectal mucosa.   Lab Review CA125 as noted above  Radiologic Imaging: n/a    Assessment:  TYLOR GAMBRILL is a 77 y.o. female diagnosed with optimally debulked stage IIIc  high-grade serous ovarian cancer, initially diagnosed 12/22/2014 s/p chemotherapy with complete response. Recurrent ovarian cancer diagnosed 01/10/2016.  ATM mutation positive. HRD negative. Extensive family history of cancer (breast, pancreatic, throat, gastric, adenosarcoma of the chest, carcinoid tumor of the small bowel) in several first degree relatives.  Vaginal lesion, granulation tissue.  Recurrent platinum-sensitive ovarian cancer with stable disease on PLD/carboplatin based on RECIST criteria with ultimate progression. Platinum-resistant disease initiated on paclitaxel and bevacizumab with evidence of complete clinical response based on exam, CT scan, and CA125. Disease controlled on maintenance bevacizumab until MARCH 2019. Rapid progression on PARPi based on CA125 and imaging. Currently on gem/platin based therapy.   GI symptoms due to therapy.    History of left-sided Sciatica with ambulatory impairment improved with PT. MRI c/w DJD.  Plan:   Problem List Items Addressed This Visit      Genitourinary   Ovarian cancer, unspecified laterality ([place]) - Primary     Continue current therapy with Dr. Rogue Bussing. I recommended she discuss her GI symptoms with him  Follow up with repeat pelvic exam when she has her next CT scan to assess disease response.  We will continue to follow closely with repeat exam in approximately 2-3 months.    Gillis Ends, MD    CC:  Blain Pais, MD.

## 2018-04-17 ENCOUNTER — Inpatient Hospital Stay: Payer: PPO

## 2018-04-17 LAB — CA 125: CANCER ANTIGEN (CA) 125: 188 U/mL — AB (ref 0.0–38.1)

## 2018-04-22 DIAGNOSIS — H903 Sensorineural hearing loss, bilateral: Secondary | ICD-10-CM | POA: Diagnosis not present

## 2018-04-22 DIAGNOSIS — H6980 Other specified disorders of Eustachian tube, unspecified ear: Secondary | ICD-10-CM | POA: Diagnosis not present

## 2018-04-30 ENCOUNTER — Inpatient Hospital Stay: Payer: PPO | Attending: Internal Medicine

## 2018-04-30 ENCOUNTER — Inpatient Hospital Stay: Payer: PPO

## 2018-04-30 ENCOUNTER — Other Ambulatory Visit: Payer: Self-pay

## 2018-04-30 ENCOUNTER — Inpatient Hospital Stay (HOSPITAL_BASED_OUTPATIENT_CLINIC_OR_DEPARTMENT_OTHER): Payer: PPO | Admitting: Internal Medicine

## 2018-04-30 VITALS — BP 129/85 | HR 61 | Temp 97.0°F | Resp 20 | Ht 64.0 in | Wt 192.0 lb

## 2018-04-30 DIAGNOSIS — R600 Localized edema: Secondary | ICD-10-CM

## 2018-04-30 DIAGNOSIS — K219 Gastro-esophageal reflux disease without esophagitis: Secondary | ICD-10-CM | POA: Diagnosis not present

## 2018-04-30 DIAGNOSIS — R5383 Other fatigue: Secondary | ICD-10-CM | POA: Diagnosis not present

## 2018-04-30 DIAGNOSIS — D649 Anemia, unspecified: Secondary | ICD-10-CM | POA: Insufficient documentation

## 2018-04-30 DIAGNOSIS — Z7689 Persons encountering health services in other specified circumstances: Secondary | ICD-10-CM | POA: Insufficient documentation

## 2018-04-30 DIAGNOSIS — Z90722 Acquired absence of ovaries, bilateral: Secondary | ICD-10-CM | POA: Diagnosis not present

## 2018-04-30 DIAGNOSIS — Z79899 Other long term (current) drug therapy: Secondary | ICD-10-CM

## 2018-04-30 DIAGNOSIS — Z9223 Personal history of estrogen therapy: Secondary | ICD-10-CM

## 2018-04-30 DIAGNOSIS — K5900 Constipation, unspecified: Secondary | ICD-10-CM

## 2018-04-30 DIAGNOSIS — Z800 Family history of malignant neoplasm of digestive organs: Secondary | ICD-10-CM | POA: Insufficient documentation

## 2018-04-30 DIAGNOSIS — I4891 Unspecified atrial fibrillation: Secondary | ICD-10-CM | POA: Diagnosis not present

## 2018-04-30 DIAGNOSIS — E785 Hyperlipidemia, unspecified: Secondary | ICD-10-CM

## 2018-04-30 DIAGNOSIS — E039 Hypothyroidism, unspecified: Secondary | ICD-10-CM | POA: Insufficient documentation

## 2018-04-30 DIAGNOSIS — Z923 Personal history of irradiation: Secondary | ICD-10-CM

## 2018-04-30 DIAGNOSIS — D696 Thrombocytopenia, unspecified: Secondary | ICD-10-CM | POA: Insufficient documentation

## 2018-04-30 DIAGNOSIS — Z90710 Acquired absence of both cervix and uterus: Secondary | ICD-10-CM | POA: Insufficient documentation

## 2018-04-30 DIAGNOSIS — Z853 Personal history of malignant neoplasm of breast: Secondary | ICD-10-CM | POA: Insufficient documentation

## 2018-04-30 DIAGNOSIS — Z803 Family history of malignant neoplasm of breast: Secondary | ICD-10-CM

## 2018-04-30 DIAGNOSIS — D039 Melanoma in situ, unspecified: Secondary | ICD-10-CM

## 2018-04-30 DIAGNOSIS — R5381 Other malaise: Secondary | ICD-10-CM

## 2018-04-30 DIAGNOSIS — Z5111 Encounter for antineoplastic chemotherapy: Secondary | ICD-10-CM | POA: Insufficient documentation

## 2018-04-30 DIAGNOSIS — C569 Malignant neoplasm of unspecified ovary: Secondary | ICD-10-CM

## 2018-04-30 DIAGNOSIS — K0889 Other specified disorders of teeth and supporting structures: Secondary | ICD-10-CM | POA: Diagnosis not present

## 2018-04-30 DIAGNOSIS — Z7982 Long term (current) use of aspirin: Secondary | ICD-10-CM | POA: Diagnosis not present

## 2018-04-30 DIAGNOSIS — R6884 Jaw pain: Secondary | ICD-10-CM | POA: Diagnosis not present

## 2018-04-30 DIAGNOSIS — C786 Secondary malignant neoplasm of retroperitoneum and peritoneum: Secondary | ICD-10-CM

## 2018-04-30 DIAGNOSIS — R509 Fever, unspecified: Secondary | ICD-10-CM | POA: Diagnosis not present

## 2018-04-30 LAB — CBC WITH DIFFERENTIAL/PLATELET
Basophils Absolute: 0 10*3/uL (ref 0–0.1)
Basophils Relative: 0 %
Eosinophils Absolute: 0 10*3/uL (ref 0–0.7)
Eosinophils Relative: 1 %
HEMATOCRIT: 32 % — AB (ref 35.0–47.0)
HEMOGLOBIN: 10.8 g/dL — AB (ref 12.0–16.0)
LYMPHS ABS: 2 10*3/uL (ref 1.0–3.6)
Lymphocytes Relative: 51 %
MCH: 35.6 pg — ABNORMAL HIGH (ref 26.0–34.0)
MCHC: 33.7 g/dL (ref 32.0–36.0)
MCV: 105.6 fL — AB (ref 80.0–100.0)
MONOS PCT: 17 %
Monocytes Absolute: 0.7 10*3/uL (ref 0.2–0.9)
NEUTROS ABS: 1.2 10*3/uL — AB (ref 1.4–6.5)
NEUTROS PCT: 31 %
Platelets: 268 10*3/uL (ref 150–440)
RBC: 3.03 MIL/uL — ABNORMAL LOW (ref 3.80–5.20)
RDW: 19.9 % — ABNORMAL HIGH (ref 11.5–14.5)
WBC: 3.9 10*3/uL (ref 3.6–11.0)

## 2018-04-30 LAB — URINALYSIS, COMPLETE (UACMP) WITH MICROSCOPIC
BACTERIA UA: NONE SEEN
Bilirubin Urine: NEGATIVE
Glucose, UA: NEGATIVE mg/dL
Hgb urine dipstick: NEGATIVE
Ketones, ur: NEGATIVE mg/dL
Leukocytes, UA: NEGATIVE
Nitrite: NEGATIVE
Protein, ur: NEGATIVE mg/dL
SPECIFIC GRAVITY, URINE: 1.009 (ref 1.005–1.030)
SQUAMOUS EPITHELIAL / LPF: NONE SEEN (ref 0–5)
pH: 5 (ref 5.0–8.0)

## 2018-04-30 LAB — COMPREHENSIVE METABOLIC PANEL
ALBUMIN: 3.5 g/dL (ref 3.5–5.0)
ALK PHOS: 82 U/L (ref 38–126)
ALT: 15 U/L (ref 0–44)
AST: 24 U/L (ref 15–41)
Anion gap: 9 (ref 5–15)
BUN: 17 mg/dL (ref 8–23)
CALCIUM: 9 mg/dL (ref 8.9–10.3)
CO2: 22 mmol/L (ref 22–32)
CREATININE: 0.96 mg/dL (ref 0.44–1.00)
Chloride: 107 mmol/L (ref 98–111)
GFR calc Af Amer: >60 mL/min (ref >60)
GFR calc non Af Amer: 57 mL/min — ABNORMAL LOW (ref >60)
GLUCOSE: 100 mg/dL — AB (ref 70–99)
Potassium: 4 mmol/L (ref 3.5–5.1)
SODIUM: 138 mmol/L (ref 135–145)
Total Bilirubin: 0.5 mg/dL (ref 0.3–1.2)
Total Protein: 6.4 g/dL — ABNORMAL LOW (ref 6.5–8.1)

## 2018-04-30 MED ORDER — SODIUM CHLORIDE 0.9 % IV SOLN
Freq: Once | INTRAVENOUS | Status: AC
  Administered 2018-04-30: 10:00:00 via INTRAVENOUS
  Filled 2018-04-30: qty 250

## 2018-04-30 MED ORDER — SODIUM CHLORIDE 0.9 % IV SOLN
2000.0000 mg | Freq: Once | INTRAVENOUS | Status: AC
  Administered 2018-04-30: 2000 mg via INTRAVENOUS
  Filled 2018-04-30: qty 52.6

## 2018-04-30 MED ORDER — SODIUM CHLORIDE 0.9 % IV SOLN
430.0000 mg | Freq: Once | INTRAVENOUS | Status: AC
  Administered 2018-04-30: 430 mg via INTRAVENOUS
  Filled 2018-04-30: qty 43

## 2018-04-30 MED ORDER — PALONOSETRON HCL INJECTION 0.25 MG/5ML
0.2500 mg | Freq: Once | INTRAVENOUS | Status: AC
  Administered 2018-04-30: 0.25 mg via INTRAVENOUS
  Filled 2018-04-30: qty 5

## 2018-04-30 MED ORDER — HEPARIN SOD (PORK) LOCK FLUSH 100 UNIT/ML IV SOLN
500.0000 [IU] | Freq: Once | INTRAVENOUS | Status: AC
  Administered 2018-04-30: 500 [IU] via INTRAVENOUS
  Filled 2018-04-30: qty 5

## 2018-04-30 MED ORDER — SODIUM CHLORIDE 0.9% FLUSH
10.0000 mL | INTRAVENOUS | Status: DC | PRN
  Administered 2018-04-30: 10 mL via INTRAVENOUS
  Filled 2018-04-30: qty 10

## 2018-04-30 MED ORDER — DEXAMETHASONE SODIUM PHOSPHATE 10 MG/ML IJ SOLN
10.0000 mg | Freq: Once | INTRAMUSCULAR | Status: AC
  Administered 2018-04-30: 10 mg via INTRAVENOUS
  Filled 2018-04-30: qty 1

## 2018-04-30 NOTE — Assessment & Plan Note (Addendum)
#   RECURRENT HIGH GRADE SEROUS ovarian cancer;   AUG 2019-CT abdomen pelvis shows progression-peritoneal/serosal.  Ca125-rising.  Carbo-Gem cycle. STABLE.  Tolerating well except for neutropenia postchemotherapy.  # Proceed with carbo-Gem cycle #2; day 1 today. Labs today reviewed;  acceptable for treatment today. ANC 1.2; discontinue day-8 GEM  #Constipation-continue MiraLAX.  Stable.  # Bil LE swelling-new.  Compression stockings.   #Fatigue-multifactorial STABLE.   # CBC/CMP/Ca-125/MD-GEM-Carbo; Day-2 neulasta in 3 weeks.

## 2018-04-30 NOTE — Progress Notes (Signed)
[place] OFFICE PROGRESS NOTE  Patient Care Team: Jerrol Banana., MD as PCP - General (Family Medicine) Gillis Ends, MD as Referring Physician (Obstetrics and Gynecology) Cammie Sickle, MD as Consulting Physician (Oncology) Corey Skains, MD as Consulting Physician (Cardiology) Marval Regal, NP as Nurse Practitioner (Nurse Practitioner) Manya Silvas, MD as Consulting Physician (Gastroenterology)  Cancer Staging Malignant neoplasm of ovary [facility]) Staging form: Ovary, AJCC 7th Edition - Clinical: Stage IIIC (T3c, N0, M0) - Unsigned    Oncology History   # April- MAY 2016- HIGH GRADE SEROUS OVARIAN CANCER STAGE IIIC; CA-125 +2300+;  s/p OPTIMAL DEBULKING SURGERY   # MAY 2016[name] DD [finished in Sep 19th 2017]  # MAY CT 2017- RECURRENT/Peritoneal carcinomatosis/pelvic implant ~1.2cm/Ca-125-34;   # July 3rd 2017- CARBO-DOXIL q 4W [with neulasta]; CT May 11 2016- slight increase peritoneal / stable nodules. Cont chemo [finished DEC 2017]. Last dose CARBO-07/11/2016; DEC 28th 2017 CT- Increase in peritoneal deposits; increasing Ca 125 [60s]- PLATINUM REFRACTORY  # Jan 2018- Taxol-Avastin x6 cycles; Aug 2018- CR; Aug 2018- start Avastin Maintenance; MARCH 2019- PROGRESSION- STOP avastin.   # MID April 2019- Zejula [300 mg/day]; 4 days- later STOPPED sec to Chest pain;  # June 1st week- Lynparza    # May 2019- TAKASUBO ? [s/p cardiac cath- Dr.Kowalski]  # G-1-2 hand foot syndrome-   # LEFT BREAST CA s/p Lumpec & RT s/p TAM   # Afib [cardiology]; JUNE 2017- MUGA 62%  Jan 2018- MOLECULAR TESTING- ATM DELETERIOUS HETEROZYGOUS mutation; TUMOR BRCA- NEG; Myriad genomic instability- NEGATIVE;   # NGS/Omniseq- BRCA-2 copy loss/ * [april2019]; NEG- BRCA Mutations/N-TRK/MSI-STABLE.  ---------------------------------------------------------------------    DIAGNOSIS: MAY 2017; RECURRENT OVARIAN CA-high-grade  serous/platinum refractory  STAGE: IV        ;GOALS: PALLIATIVE  CURRENT/MOST RECENT THERAPY: June 2nd week  Lynparza      Malignant neoplasm of ovary ([place])   04/08/2018 -  Chemotherapy    The patient had palonosetron (ALOXI) injection 0.25 mg, 0.25 mg, Intravenous,  Once, 2 of 4 cycles Administration: 0.25 mg (04/09/2018) pegfilgrastim-cbqv (UDENYCA) injection 6 mg, 6 mg, Subcutaneous, Once, 1 of 3 cycles CARBOplatin (PARAPLATIN) 430 mg in sodium chloride 0.9 % 250 mL chemo infusion, 430 mg (100 % of original dose 430.5 mg), Intravenous,  Once, 2 of 4 cycles Dose modification:   (original dose 430.5 mg, Cycle 1) Administration: 430 mg (04/09/2018) gemcitabine (GEMZAR) 2,000 mg in sodium chloride 0.9 % 250 mL chemo infusion, 1,976 mg, Intravenous,  Once, 2 of 4 cycles Administration: 2,000 mg (04/09/2018)  for chemotherapy treatment.      Ovarian cancer, unspecified laterality ([place])      INTERVAL HISTORY:  Jamie Conner 77 y.o.  female pleasant patient above history of high-grade serous ovarian cancer currently on [place] gemcitabine is here for follow-up.  Patient is day 8 chemotherapy had to be held because of severe neutropenia.  Patient complains of constipation.  Patient complains of significant fatigue post chemotherapy.  However currently improved.  No fevers or chills.  Review of Systems  Constitutional: Positive for malaise/fatigue. Negative for chills, diaphoresis, fever and weight loss.  HENT: Negative for nosebleeds and sore throat.   Eyes: Negative for double vision.  Respiratory: Negative for cough, hemoptysis, sputum production, shortness of breath and wheezing.   Cardiovascular: Negative for chest pain, palpitations, orthopnea and leg swelling.  Gastrointestinal: Positive for constipation. Negative for abdominal pain, blood in stool, diarrhea, heartburn, melena, nausea and  vomiting.  Genitourinary: Negative for dysuria, frequency and urgency.  Musculoskeletal:  Negative for back pain and joint pain.  Skin: Negative.  Negative for itching and rash.  Neurological: Negative for dizziness, tingling, focal weakness, weakness and headaches.  Endo/Heme/Allergies: Does not bruise/bleed easily.  Psychiatric/Behavioral: Negative for depression. The patient is not nervous/anxious and does not have insomnia.       PAST MEDICAL HISTORY :  Past Medical History:  Diagnosis Date  . Atrial fibrillation ([place])   . Breast cancer ([place]) 1998  . Cancer of breast ([place]) 1998   Left/radiation  . CINV (chemotherapy-induced nausea and vomiting)   . GERD (gastroesophageal reflux disease)   . Hyperlipidemia   . Hypothyroidism   . Mitral insufficiency   . Ovarian cancer ([place])    s/p BSO optimal tumor debulking May 2016/chemo  . Personal history of chemotherapy since 2016   ovarian cancer  . Personal history of radiation therapy 1998  . PVC (premature ventricular contraction)     PAST SURGICAL HISTORY :   Past Surgical History:  Procedure Laterality Date  . ABDOMINAL HYSTERECTOMY    . BILATERAL SALPINGOOPHORECTOMY  May 2016   with optimal tumor debulking   . BREAST LUMPECTOMY Left 1998  . BREAST SURGERY     Left  . CESAREAN SECTION     x2  . CHOLECYSTECTOMY    . COLONOSCOPY  2006  . DEBULKING N/A 12/22/2014   Procedure: DEBULKING;  Surgeon: Gillis Ends, MD;  Location: [facility] ORS;  Service: Gynecology;  Laterality: N/A;  . LAPAROTOMY N/A 12/22/2014   Procedure: EXPLORATORY LAPAROTOMY;  Surgeon: Robert Bellow, MD;  Location: [facility] ORS;  Service: General;  Laterality: N/A;  . LAPAROTOMY WITH STAGING N/A 12/22/2014   Procedure: LAPAROTOMY WITH STAGING;  Surgeon: Gillis Ends, MD;  Location: [facility] OR[facility];  Service: Gynecology;  Laterality: N/A;  . LEFT HEART CATH AND CORONARY ANGIOGRAPHY N/A 12/10/2017   Procedure: LEFT HEART CATH AND CORONARY ANGIOGRAPHY;  Surgeon: Yolonda Kida, MD;  Location: [facility];  Service: Cardiovascular;   Laterality: N/A;  . PERIPHERAL VASCULAR CATHETERIZATION Left 12/22/2014   Procedure: PORTA CATH INSERTION;  Surgeon: Robert Bellow, MD;  Location: [facility] OR[facility];  Service: General;  Laterality: Left;  . PORTACATH PLACEMENT Right 12/22/2014   Procedure: INSERTION PORT-A-CATH;  Surgeon: Robert Bellow, MD;  Location: [facility] OR[facility];  Service: General;  Laterality: Right;  . SALPINGOOPHORECTOMY Bilateral 12/22/2014   Procedure: SALPINGO OOPHORECTOMY;  Surgeon: Gillis Ends, MD;  Location: [facility] OR[facility];  Service: Gynecology;  Laterality: Bilateral;  . UPPER GI ENDOSCOPY  09/25/04   hiatus hernia, schatzki ring and a single gastric polyp    FAMILY HISTORY :   Family History  Problem Relation Age of Onset  . Cancer Mother        breast, throat, and stomach  . Breast cancer Mother   . Heart disease Father   . Pulmonary embolism Sister   . Cancer Brother 60       angiosarcoma of the chest; carcinoid small intestinal tumor  . Hypertension Brother   . Stroke Brother   . Cancer Maternal Aunt        breast cancer  . Cancer Maternal Grandfather 47       pancreatic    SOCIAL HISTORY:   Social History   Tobacco Use  . Smoking status: Never Smoker  . Smokeless tobacco: Never Used  Substance Use Topics  . Alcohol use: No  . Drug use:  No    ALLERGIES:  is allergic to carafate [sucralfate] and sulfa antibiotics.  MEDICATIONS:  Current Outpatient Medications  Medication Sig Dispense Refill  . aspirin EC 325 MG tablet Take 1 tablet (325 mg total) by mouth daily. 100 tablet 3  . Cholecalciferol (VITAMIN D) 2000 units tablet Take 1 tablet (2,000 Units total) by mouth daily. 30 tablet 12  . Docosanol (ABREVA EX) Apply 1 application topically as needed. Fever blisters    . fluticasone (FLONASE) 50 MCG/ACT nasal spray Place 1 spray into both nostrils daily as needed.     Marland Kitchen levothyroxine (SYNTHROID, LEVOTHROID) 50 MCG tablet TAKE ONE TABLET ON AN EMPTY STOMACH WITHA GLASS OF WATER AT LEAST 30 TO 60  MINUTES BEFORE BREAKFAST 30 tablet 12  . lidocaine-prilocaine (EMLA) cream Apply to port area 30-45 mins prior to chemo. 30 g 5  . loratadine (CLARITIN) 10 MG tablet Take 10 mg by mouth daily as needed.     Marland Kitchen LORazepam (ATIVAN) 0.5 MG tablet 1 tablet at night as needed for anxiety/sleep 30 tablet 1  . Lysine 500 MG TABS Take 1 tablet by mouth daily as needed.     . metoprolol succinate (TOPROL-XL) 25 MG 24 hr tablet Take 1 tablet by mouth daily.    . Multiple Vitamin (MULTIVITAMIN) capsule Take 1 capsule by mouth daily.    . pantoprazole (PROTONIX) 40 MG tablet Take 1 tablet (40 mg total) by mouth daily. 30 tablet 1  . acetaminophen (TYLENOL) 500 MG tablet Take 500 mg by mouth every 6 (six) hours as needed.    Marland Kitchen alum & mag hydroxide-simeth (MAALOX/MYLANTA) 200-200-20 MG/5ML suspension Take 15 mLs by mouth every 6 (six) hours as needed for indigestion or heartburn. (Patient not taking: Reported on 04/30/2018) 355 mL 0  . furosemide (LASIX) 20 MG tablet Take 0.5 tablets (10 mg total) by mouth daily. (Patient not taking: Reported on 04/09/2018) 30 tablet 0  . lactulose (CHRONULAC) 10 GM/15ML solution Take 15 mLs (10 g total) by mouth daily as needed for moderate constipation. (Patient not taking: Reported on 04/30/2018) 236 mL 0  . ondansetron (ZOFRAN) 4 MG tablet One pill 30-60 mins prior to taking zejula to prevent nausea/vomitting. (Patient not taking: Reported on 04/30/2018) 45 tablet 3  . ondansetron (ZOFRAN) 8 MG tablet Take 1 tablet (8 mg total) by mouth every 8 (eight) hours as needed for nausea or vomiting. (Patient not taking: Reported on 04/30/2018) 20 tablet 4  . polyethylene glycol (MIRALAX / GLYCOLAX) packet Take 17 g by mouth daily as needed.    . prochlorperazine (COMPAZINE) 10 MG tablet Take 1 tablet (10 mg total) by mouth every 6 (six) hours as needed for nausea or vomiting. (Patient not taking: Reported on 04/30/2018) 30 tablet 4  . traMADol (ULTRAM) 50 MG tablet Take 1 tablet (50 mg total)  by mouth every 6 (six) hours as needed. (Patient not taking: Reported on 04/09/2018) 30 tablet 0   No current facility-administered medications for this visit.    Facility-Administered Medications Ordered in Other Visits  Medication Dose Route Frequency Provider Last Rate Last Dose  . sodium chloride flush (NS) 0.9 % injection 10 mL  10 mL Intravenous PRN Earlie Server, MD   10 mL at 04/30/18 0840    PHYSICAL EXAMINATION: ECOG PERFORMANCE STATUS: 1 - Symptomatic but completely ambulatory  BP 129/85 (Patient Position: Sitting)   Pulse 61   Temp (!) 97 F (36.1 C) (Tympanic)   Resp 20   Ht '5\' 4"'$  (  1.626 m)   Wt 192 lb (87.1 kg)   BMI 32.96 kg/m   Filed Weights   04/30/18 0904  Weight: 192 lb (87.1 kg)    Physical Exam  Constitutional: She is oriented to person, place, and time and well-developed, well-nourished, and in no distress.  Accompanied by husband.  She is walking by herself.  HENT:  Head: Normocephalic and atraumatic.  Mouth/Throat: Oropharynx is clear and moist. No oropharyngeal exudate.  Eyes: Pupils are equal, round, and reactive to light.  Neck: Normal range of motion. Neck supple.  Cardiovascular: Normal rate and regular rhythm.  Pulmonary/Chest: No respiratory distress. She has no wheezes.  Abdominal: Soft. Bowel sounds are normal. She exhibits no distension and no mass. There is no tenderness. There is no rebound and no guarding.  Musculoskeletal: Normal range of motion. She exhibits no edema or tenderness.  Neurological: She is alert and oriented to person, place, and time.  Skin: Skin is warm.  Psychiatric: Affect normal.       LABORATORY DATA:  I have reviewed the data as listed    Component Value Date/Time   NA 138 04/30/2018 0850   NA 140 12/14/2014 1037   K 4.0 04/30/2018 0850   K 4.4 12/14/2014 1037   CL 107 04/30/2018 0850   CL 106 12/14/2014 1037   CO2 22 04/30/2018 0850   CO2 26 12/14/2014 1037   GLUCOSE 100 (H) 04/30/2018 0850   GLUCOSE 87  12/14/2014 1037   BUN 17 04/30/2018 0850   BUN 12 12/14/2014 1037   CREATININE 0.96 04/30/2018 0850   CREATININE 0.97 12/14/2014 1037   CALCIUM 9.0 04/30/2018 0850   CALCIUM 9.2 12/14/2014 1037   PROT 6.4 (L) 04/30/2018 0850   PROT 7.3 12/14/2014 1037   ALBUMIN 3.5 04/30/2018 0850   ALBUMIN 3.8 12/14/2014 1037   AST 24 04/30/2018 0850   AST 20 12/14/2014 1037   ALT 15 04/30/2018 0850   ALT 11 (L) 12/14/2014 1037   ALKPHOS 82 04/30/2018 0850   ALKPHOS 138 (H) 12/14/2014 1037   BILITOT 0.5 04/30/2018 0850   BILITOT 0.5 12/14/2014 1037   GFRNONAA 57 (L) 04/30/2018 0850   GFRNONAA 59 (L) 12/14/2014 1037   GFRAA >60 04/30/2018 0850   GFRAA >60 12/14/2014 1037    No results found for: SPEP, UPEP  Lab Results  Component Value Date   WBC 3.9 04/30/2018   NEUTROABS 1.2 (L) 04/30/2018   HGB 10.8 (L) 04/30/2018   HCT 32.0 (L) 04/30/2018   MCV 105.6 (H) 04/30/2018   PLT 268 04/30/2018      Chemistry      Component Value Date/Time   NA 138 04/30/2018 0850   NA 140 12/14/2014 1037   K 4.0 04/30/2018 0850   K 4.4 12/14/2014 1037   CL 107 04/30/2018 0850   CL 106 12/14/2014 1037   CO2 22 04/30/2018 0850   CO2 26 12/14/2014 1037   BUN 17 04/30/2018 0850   BUN 12 12/14/2014 1037   CREATININE 0.96 04/30/2018 0850   CREATININE 0.97 12/14/2014 1037   GLU 84 03/16/2014      Component Value Date/Time   CALCIUM 9.0 04/30/2018 0850   CALCIUM 9.2 12/14/2014 1037   ALKPHOS 82 04/30/2018 0850   ALKPHOS 138 (H) 12/14/2014 1037   AST 24 04/30/2018 0850   AST 20 12/14/2014 1037   ALT 15 04/30/2018 0850   ALT 11 (L) 12/14/2014 1037   BILITOT 0.5 04/30/2018 0850   BILITOT 0.5 12/14/2014  [place]: I have personally reviewed the radiological images as listed and agreed with the findings in the report. No results found.   ASSESSMENT & PLAN:  Ovarian cancer, unspecified laterality ([place]) # RECURRENT HIGH GRADE SEROUS ovarian cancer;   AUG 2019-CT abdomen  pelvis shows progression-peritoneal/serosal.  Ca125-rising.  Carbo-Gem cycle. STABLE.  Tolerating well except for neutropenia postchemotherapy.  # Proceed with carbo-Gem cycle #2; day 1 today. Labs today reviewed;  acceptable for treatment today. ANC 1.2; discontinue day-8 GEM  #Constipation-continue MiraLAX.  Stable.  # Bil LE swelling-new.  Compression stockings.   #Fatigue-multifactorial STABLE.   # CBC/CMP/Ca-125/MD-GEM-Carbo; Day-2 neulasta in 3 weeks.    Orders Placed This Encounter  Procedures  . CBC with Differential/Platelet    Standing Status:   Future    Standing Expiration Date:   05/01/2019  . Comprehensive metabolic panel    Standing Status:   Future    Standing Expiration Date:   05/01/2019  . CA 125    Standing Status:   Future    Standing Expiration Date:   04/30/2019   All questions were answered. The patient knows to call the clinic with any problems, questions or concerns.      Cammie Sickle, MD 04/30/2018 3:12 PM

## 2018-05-01 ENCOUNTER — Inpatient Hospital Stay: Payer: PPO

## 2018-05-01 DIAGNOSIS — Z5111 Encounter for antineoplastic chemotherapy: Secondary | ICD-10-CM | POA: Diagnosis not present

## 2018-05-01 DIAGNOSIS — C569 Malignant neoplasm of unspecified ovary: Secondary | ICD-10-CM

## 2018-05-01 MED ORDER — PEGFILGRASTIM-CBQV 6 MG/0.6ML ~~LOC~~ SOSY
6.0000 mg | PREFILLED_SYRINGE | Freq: Once | SUBCUTANEOUS | Status: AC
  Administered 2018-05-01: 6 mg via SUBCUTANEOUS

## 2018-05-14 ENCOUNTER — Ambulatory Visit
Admission: RE | Admit: 2018-05-14 | Discharge: 2018-05-14 | Disposition: A | Discharge status: Home / Self Care | Payer: PPO | Source: Ambulatory Visit | Attending: Oncology | Admitting: Oncology

## 2018-05-14 ENCOUNTER — Inpatient Hospital Stay
Admission: RE | Admit: 2018-05-14 | Discharge: 2018-05-14 | Disposition: A | Discharge status: Home / Self Care | Payer: Self-pay | Source: Ambulatory Visit | Attending: Oncology | Admitting: Oncology

## 2018-05-14 ENCOUNTER — Inpatient Hospital Stay: Payer: PPO

## 2018-05-14 ENCOUNTER — Telehealth: Payer: Self-pay | Admitting: *Deleted

## 2018-05-14 ENCOUNTER — Encounter: Payer: Self-pay | Admitting: Oncology

## 2018-05-14 ENCOUNTER — Inpatient Hospital Stay (HOSPITAL_BASED_OUTPATIENT_CLINIC_OR_DEPARTMENT_OTHER): Payer: PPO | Admitting: Oncology

## 2018-05-14 VITALS — BP 114/70 | HR 81 | Temp 98.3°F | Resp 18 | Wt 193.0 lb

## 2018-05-14 DIAGNOSIS — Z90722 Acquired absence of ovaries, bilateral: Secondary | ICD-10-CM | POA: Diagnosis not present

## 2018-05-14 DIAGNOSIS — D696 Thrombocytopenia, unspecified: Secondary | ICD-10-CM

## 2018-05-14 DIAGNOSIS — K0889 Other specified disorders of teeth and supporting structures: Secondary | ICD-10-CM

## 2018-05-14 DIAGNOSIS — R6884 Jaw pain: Secondary | ICD-10-CM

## 2018-05-14 DIAGNOSIS — I4891 Unspecified atrial fibrillation: Secondary | ICD-10-CM

## 2018-05-14 DIAGNOSIS — Z5189 Encounter for other specified aftercare: Secondary | ICD-10-CM

## 2018-05-14 DIAGNOSIS — D649 Anemia, unspecified: Secondary | ICD-10-CM

## 2018-05-14 DIAGNOSIS — Z9223 Personal history of estrogen therapy: Secondary | ICD-10-CM

## 2018-05-14 DIAGNOSIS — Z800 Family history of malignant neoplasm of digestive organs: Secondary | ICD-10-CM

## 2018-05-14 DIAGNOSIS — C569 Malignant neoplasm of unspecified ovary: Secondary | ICD-10-CM

## 2018-05-14 DIAGNOSIS — E785 Hyperlipidemia, unspecified: Secondary | ICD-10-CM

## 2018-05-14 DIAGNOSIS — R5383 Other fatigue: Secondary | ICD-10-CM | POA: Diagnosis not present

## 2018-05-14 DIAGNOSIS — R5381 Other malaise: Secondary | ICD-10-CM | POA: Diagnosis not present

## 2018-05-14 DIAGNOSIS — C786 Secondary malignant neoplasm of retroperitoneum and peritoneum: Secondary | ICD-10-CM | POA: Diagnosis not present

## 2018-05-14 DIAGNOSIS — Z90710 Acquired absence of both cervix and uterus: Secondary | ICD-10-CM | POA: Diagnosis not present

## 2018-05-14 DIAGNOSIS — E039 Hypothyroidism, unspecified: Secondary | ICD-10-CM

## 2018-05-14 DIAGNOSIS — Z853 Personal history of malignant neoplasm of breast: Secondary | ICD-10-CM

## 2018-05-14 DIAGNOSIS — Z923 Personal history of irradiation: Secondary | ICD-10-CM

## 2018-05-14 DIAGNOSIS — R509 Fever, unspecified: Secondary | ICD-10-CM | POA: Diagnosis not present

## 2018-05-14 DIAGNOSIS — Z79899 Other long term (current) drug therapy: Secondary | ICD-10-CM

## 2018-05-14 DIAGNOSIS — Z803 Family history of malignant neoplasm of breast: Secondary | ICD-10-CM

## 2018-05-14 DIAGNOSIS — K219 Gastro-esophageal reflux disease without esophagitis: Secondary | ICD-10-CM

## 2018-05-14 DIAGNOSIS — Z5111 Encounter for antineoplastic chemotherapy: Secondary | ICD-10-CM | POA: Diagnosis not present

## 2018-05-14 DIAGNOSIS — Z7982 Long term (current) use of aspirin: Secondary | ICD-10-CM

## 2018-05-14 LAB — CBC WITH DIFFERENTIAL/PLATELET
Basophils Absolute: 0.1 10*3/uL (ref 0–0.1)
Basophils Relative: 1 %
Eosinophils Absolute: 0 10*3/uL (ref 0–0.7)
Eosinophils Relative: 0 %
HEMATOCRIT: 30.8 % — AB (ref 35.0–47.0)
HEMOGLOBIN: 10.5 g/dL — AB (ref 12.0–16.0)
LYMPHS PCT: 23 %
Lymphs Abs: 2.5 10*3/uL (ref 1.0–3.6)
MCH: 35.9 pg — ABNORMAL HIGH (ref 26.0–34.0)
MCHC: 34 g/dL (ref 32.0–36.0)
MCV: 105.4 fL — AB (ref 80.0–100.0)
Monocytes Absolute: 1 10*3/uL — ABNORMAL HIGH (ref 0.2–0.9)
Monocytes Relative: 9 %
NEUTROS ABS: 7.4 10*3/uL — AB (ref 1.4–6.5)
NEUTROS PCT: 67 %
Platelets: 88 10*3/uL — ABNORMAL LOW (ref 150–440)
RBC: 2.92 MIL/uL — AB (ref 3.80–5.20)
RDW: 20.3 % — ABNORMAL HIGH (ref 11.5–14.5)
WBC: 11 10*3/uL (ref 3.6–11.0)

## 2018-05-14 LAB — COMPREHENSIVE METABOLIC PANEL
ALBUMIN: 3.6 g/dL (ref 3.5–5.0)
ALK PHOS: 111 U/L (ref 38–126)
ALT: 21 U/L (ref 0–44)
ANION GAP: 9 (ref 5–15)
AST: 26 U/L (ref 15–41)
BILIRUBIN TOTAL: 0.4 mg/dL (ref 0.3–1.2)
BUN: 10 mg/dL (ref 8–23)
CALCIUM: 9.4 mg/dL (ref 8.9–10.3)
CO2: 25 mmol/L (ref 22–32)
CREATININE: 0.96 mg/dL (ref 0.44–1.00)
Chloride: 106 mmol/L (ref 98–111)
GFR calc Af Amer: >60 mL/min (ref >60)
GFR calc non Af Amer: 57 mL/min — ABNORMAL LOW (ref >60)
GLUCOSE: 117 mg/dL — AB (ref 70–99)
Potassium: 4.3 mmol/L (ref 3.5–5.1)
SODIUM: 140 mmol/L (ref 135–145)
TOTAL PROTEIN: 6.7 g/dL (ref 6.5–8.1)

## 2018-05-14 NOTE — Progress Notes (Signed)
Symptom Management Consult note [facility]  [phone] [phone]  Patient Care Team: Jerrol Banana., MD as PCP - General (Family Medicine) Gillis Ends, MD as Referring Physician (Obstetrics and Gynecology) Cammie Sickle, MD as Consulting Physician (Oncology) Corey Skains, MD as Consulting Physician (Cardiology) Marval Regal, NP as Nurse Practitioner (Nurse Practitioner) Manya Silvas, MD as Consulting Physician (Gastroenterology)   Name of the patient: Jamie Conner  [id number]  06-Jan-1941   Date of visit: 05/14/2018  Diagnosis: Jaw pain - Plan: CT MAXILLOFACIAL W & WO CONTRAST, CT SOFT TISSUE NECK W CONTRAST, DG Outside Films Head/Face, DG Facial Bones Complete, DG Mandible 4 Views  Ovarian cancer, unspecified laterality ([place]) - Plan: DG Mandible 4 Views  Chief Complaint: Left sided jaw pain   Current Treatment: s/p cycle 2 gemcitabine and carbo with [place]  Oncology History: Patient last seen by medical oncologist Dr. Rogue Bussing on 04/30/2018 for assessment prior to cycle 2 carbo/gemcitabine.  Cycle 1 day 8 chemotherapy held due to severe neutropenia.  At that visit she complained of constipation and significant fatigue.  Most recent CT abdomen pelvis from August 2019 showed progression of peritoneal disease.  Her Ca1 25 continue to rise.  She was instructed to continue MiraLAX for constipation.   Oncology History   # April- MAY 2016- HIGH GRADE SEROUS OVARIAN CANCER STAGE IIIC; CA-125 +2300+;  s/p OPTIMAL DEBULKING SURGERY   # MAY 2016[name] [finished in Sep 19th 2017]  # MAY CT 2017- RECURRENT/Peritoneal carcinomatosis/pelvic implant ~1.2cm/Ca-125-34;   # July 3rd 2017- CARBO-DOXIL q 4W [with neulasta]; CT May 11 2016- slight increase peritoneal / stable nodules. Cont chemo [finished DEC 2017]. Last dose CARBO-07/11/2016; DEC 28th 2017 CT- Increase in peritoneal deposits;  increasing Ca 125 [60s]- PLATINUM REFRACTORY  # Jan 2018- Taxol-Avastin x6 cycles; Aug 2018- CR; Aug 2018- start Avastin Maintenance; MARCH 2019- PROGRESSION- STOP avastin.   # MID April 2019- Zejula [300 mg/day]; 4 days- later STOPPED sec to Chest pain;  # June 1st week- Lynparza    # May 2019- TAKASUBO ? [s/p cardiac cath- Dr.Kowalski]  # G-1-2 hand foot syndrome-   # LEFT BREAST CA s/p Lumpec & RT s/p TAM   # Afib [cardiology]; JUNE 2017- MUGA 62%  Jan 2018- MOLECULAR TESTING- ATM DELETERIOUS HETEROZYGOUS mutation; TUMOR BRCA- NEG; Myriad genomic instability- NEGATIVE;   # NGS/Omniseq- BRCA-2 copy loss/ * [april2019]; NEG- BRCA Mutations/N-TRK/MSI-STABLE.  ---------------------------------------------------------------------    DIAGNOSIS: MAY 2017; RECURRENT OVARIAN CA-high-grade serous/platinum refractory  STAGE: IV        ;GOALS: PALLIATIVE  CURRENT/MOST RECENT THERAPY: June 2nd week  Lynparza      Malignant neoplasm of ovary ([place])   04/08/2018 -  Chemotherapy    The patient had palonosetron (ALOXI) injection 0.25 mg, 0.25 mg, Intravenous,  Once, 2 of 4 cycles Administration: 0.25 mg (04/09/2018), 0.25 mg (04/30/2018) pegfilgrastim-cbqv (UDENYCA) injection 6 mg, 6 mg, Subcutaneous, Once, 1 of 3 cycles Administration: 6 mg (05/01/2018) CARBOplatin (PARAPLATIN) 430 mg in sodium chloride 0.9 % 250 mL chemo infusion, 430 mg (100 % of original dose 430.5 mg), Intravenous,  Once, 2 of 4 cycles Dose modification:   (original dose 430.5 mg, Cycle 1) Administration: 430 mg (04/09/2018), 430 mg (04/30/2018) gemcitabine (GEMZAR) 2,000 mg in sodium chloride 0.9 % 250 mL chemo infusion, 1,976 mg, Intravenous,  Once, 2 of 4 cycles Administration: 2,000 mg (04/09/2018), 2,000 mg (04/30/2018)  for chemotherapy treatment.  Ovarian cancer, unspecified laterality ([place])   Subjective Data:  Subjective:     Jamie Conner is a 77 y.o. female who presents for evaluation of a febrile  illness and left sided jaw and tooth pain.  It has been present for 2 days, beginning on Monday night  Associated signs & symptoms:  chills, fatigue and left sided jaw/teeth pain. Unable to completely close mouth d/t teeth and jaw sensitivity.  Tmax 99.5 last night. Tried tylenol with complete relief. Admits to recent dental cleaning with good report in August.  Has been seen in the past for similar complaints.  Most recently on 03/03/2018.  She was started on Augmentin twice daily for 10 days with improvement of symptoms. No imaging was performed at that time.  The following portions of the patient's history were reviewed and updated as appropriate: allergies, current medications, past family history, past medical history, past social history, past surgical history and problem list.  Review of Systems A comprehensive review of systems was negative except for: Constitutional: positive for chills, fatigue, fevers and malaise Ears, nose, mouth, throat, and face: positive for left sided jaw and left lower tooth pain       Objective:    BP 114/70 (BP Location: Right Arm, Patient Position: Sitting)   Pulse 81   Temp 98.3 F (36.8 C) (Oral)   Resp 18   Wt 193 lb (87.5 kg)   BMI 33.13 kg/m   Physical Exam  Constitutional: She is oriented to person, place, and time. Vital signs are normal. She appears well-developed and well-nourished.  HENT:  Head: Normocephalic and atraumatic.    Mouth/Throat: Oropharynx is clear and moist and mucous membranes are normal.  Eyes: Pupils are equal, round, and reactive to light.  Neck: Normal range of motion.  Cardiovascular: Normal rate, regular rhythm and normal heart sounds.  No murmur heard. Pulmonary/Chest: Effort normal and breath sounds normal. She has no wheezes.  Abdominal: Soft. Normal appearance and bowel sounds are normal. She exhibits no distension. There is no tenderness.  Musculoskeletal: Normal range of motion. She exhibits no edema.    Lymphadenopathy:       Head (left side): Tonsillar adenopathy present.  Neurological: She is alert and oriented to person, place, and time.  Skin: Skin is warm and dry. No rash noted.  Psychiatric: Judgment normal.      Assessment:   Fever of unknown origin. Lungs are clear. No GU symptoms. Labs ok. She is not neutropenic. Acute jaw pain. Could be tooth abscess or parotiditis.  Recently seen by dentist with good report.  Will get imaging to rule out acute abnormality.     Plan:   Stat labs and assessment.  Labs revealed anemia (10.5), thrombocytopenia (88,000) and ANC of 7.4.  Imaging per orders. X-ray today to rule out tooth abscess. May need facial/head ultrasound or CT scan with contrast to rule out parotiditis.  Xray Mandible X 4 views: No evidence of fracture or focal bone lesion.  No air-fluid levels within the paranasal sinuses.  Dr. Rogue Bussing consulted and he recommends she be seen by her dentist ASAP.  Spoke to Dr. Thereasa Parkin family dentist office and patient has an appointment 05/15/18 at 4 PM with Dr. Berneta Sages  Given she is not neutropenic , will wait until she is evaluated by dentist prior to initiating antibiotics.  Will consult dentist with recommendations.  Greater than 50% was spent in counseling and coordination of care with this patient including but not limited to discussion  of the relevant topics above (See A&P) including, but not limited to diagnosis and management of acute and chronic medical conditions.   Faythe Casa, NP 05/14/2018 1:14 PM

## 2018-05-14 NOTE — Telephone Encounter (Signed)
Patient called to report that she is having tooth and jaw pain and thinks she may have an infection. Asking what she can do about it.  Discussed with NP and patient has accepted appointment to come in for lab [facility]

## 2018-05-15 ENCOUNTER — Ambulatory Visit: Admission: RE | Admit: 2018-05-15 | Payer: PPO | Source: Ambulatory Visit

## 2018-05-16 ENCOUNTER — Telehealth: Payer: Self-pay | Admitting: *Deleted

## 2018-05-16 ENCOUNTER — Other Ambulatory Visit: Payer: Self-pay | Admitting: Oncology

## 2018-05-16 NOTE — Progress Notes (Signed)
Spoke to patient after her dental appt yesterday. An additional x-ray was performed revealing an infection below a root canal (> 10 years ago). She was started on amoxicillin TID and referred to Dr. Aleda Grana an Endodontist for further evaluation, 3-D imaging and possible root canal and/or tooth extraction.   Consulted with Dr. Rogue Bussing who will speak with Dr. Aleda Grana after their visit today.   Faythe Casa, NP 05/16/2018 10:32 AM

## 2018-05-16 NOTE — Progress Notes (Signed)
After discussion with Dr. Aleda Grana endodontist, it was decided that for the quickest recovery time so that chemotherapy could resume, a tooth extraction was recommended.  Unfortunately, Dr. Aleda Grana only performs root canals so he will refer Ms. Jamie Conner to an Chief Financial Officer.  He discussed with Dr. Rogue Bussing her current blood counts given her recent chemotherapy.  Blood counts will return to normal by next week prior to next cycle.  Cycle 3 of carbotaxol will be held to maintain blood counts and rescheduled approximately 3 weeks out from tooth extraction so that she has time for recoveryl.  Patient will keep appointment for next week to discuss blood counts and to follow-up on scheduled appointment for tooth extraction.  Faythe Casa, NP 05/16/2018 12:13 PM

## 2018-05-16 NOTE — Telephone Encounter (Signed)
Spoke to patient after her dental appt yesterday. An additional x-ray was performed revealing an infection below a root canal (> 10 years ago). She was started on amoxicillin TID and referred to Dr. Aleda Grana an Endodontist for further evaluation, 3-D imaging and possible root canal and/or tooth extraction.   Consulted with Dr. Rogue Bussing who will speak with Dr. Aleda Grana after their visit today.   Faythe Casa, NP 05/16/2018 10:32 AM

## 2018-05-16 NOTE — Telephone Encounter (Signed)
Patient called to request Jamie Casa, NP call her to discuss her dental issues.[phone]

## 2018-05-17 ENCOUNTER — Other Ambulatory Visit: Payer: Self-pay | Admitting: Family Medicine

## 2018-05-17 DIAGNOSIS — K219 Gastro-esophageal reflux disease without esophagitis: Secondary | ICD-10-CM

## 2018-05-19 DIAGNOSIS — I493 Ventricular premature depolarization: Secondary | ICD-10-CM | POA: Diagnosis not present

## 2018-05-19 DIAGNOSIS — R002 Palpitations: Secondary | ICD-10-CM | POA: Diagnosis not present

## 2018-05-19 DIAGNOSIS — E782 Mixed hyperlipidemia: Secondary | ICD-10-CM | POA: Diagnosis not present

## 2018-05-19 DIAGNOSIS — I10 Essential (primary) hypertension: Secondary | ICD-10-CM | POA: Diagnosis not present

## 2018-05-19 DIAGNOSIS — I214 Non-ST elevation (NSTEMI) myocardial infarction: Secondary | ICD-10-CM | POA: Diagnosis not present

## 2018-05-19 DIAGNOSIS — I480 Paroxysmal atrial fibrillation: Secondary | ICD-10-CM | POA: Diagnosis not present

## 2018-05-19 DIAGNOSIS — I340 Nonrheumatic mitral (valve) insufficiency: Secondary | ICD-10-CM | POA: Diagnosis not present

## 2018-05-21 ENCOUNTER — Inpatient Hospital Stay: Payer: PPO | Attending: Internal Medicine

## 2018-05-21 ENCOUNTER — Inpatient Hospital Stay: Payer: PPO

## 2018-05-21 ENCOUNTER — Inpatient Hospital Stay (HOSPITAL_BASED_OUTPATIENT_CLINIC_OR_DEPARTMENT_OTHER): Payer: PPO | Admitting: Internal Medicine

## 2018-05-21 ENCOUNTER — Other Ambulatory Visit: Payer: Self-pay

## 2018-05-21 VITALS — BP 128/82 | HR 63 | Temp 97.6°F | Resp 20 | Ht 64.0 in | Wt 193.0 lb

## 2018-05-21 DIAGNOSIS — R600 Localized edema: Secondary | ICD-10-CM | POA: Insufficient documentation

## 2018-05-21 DIAGNOSIS — Z7982 Long term (current) use of aspirin: Secondary | ICD-10-CM | POA: Insufficient documentation

## 2018-05-21 DIAGNOSIS — Z923 Personal history of irradiation: Secondary | ICD-10-CM

## 2018-05-21 DIAGNOSIS — E039 Hypothyroidism, unspecified: Secondary | ICD-10-CM | POA: Diagnosis not present

## 2018-05-21 DIAGNOSIS — R5381 Other malaise: Secondary | ICD-10-CM

## 2018-05-21 DIAGNOSIS — T451X5S Adverse effect of antineoplastic and immunosuppressive drugs, sequela: Secondary | ICD-10-CM | POA: Insufficient documentation

## 2018-05-21 DIAGNOSIS — K5900 Constipation, unspecified: Secondary | ICD-10-CM | POA: Insufficient documentation

## 2018-05-21 DIAGNOSIS — C786 Secondary malignant neoplasm of retroperitoneum and peritoneum: Secondary | ICD-10-CM | POA: Insufficient documentation

## 2018-05-21 DIAGNOSIS — E785 Hyperlipidemia, unspecified: Secondary | ICD-10-CM | POA: Insufficient documentation

## 2018-05-21 DIAGNOSIS — M1990 Unspecified osteoarthritis, unspecified site: Secondary | ICD-10-CM

## 2018-05-21 DIAGNOSIS — C569 Malignant neoplasm of unspecified ovary: Secondary | ICD-10-CM | POA: Diagnosis not present

## 2018-05-21 DIAGNOSIS — Z90710 Acquired absence of both cervix and uterus: Secondary | ICD-10-CM | POA: Insufficient documentation

## 2018-05-21 DIAGNOSIS — Z803 Family history of malignant neoplasm of breast: Secondary | ICD-10-CM

## 2018-05-21 DIAGNOSIS — Z90722 Acquired absence of ovaries, bilateral: Secondary | ICD-10-CM

## 2018-05-21 DIAGNOSIS — Z79899 Other long term (current) drug therapy: Secondary | ICD-10-CM

## 2018-05-21 DIAGNOSIS — Z5111 Encounter for antineoplastic chemotherapy: Secondary | ICD-10-CM | POA: Insufficient documentation

## 2018-05-21 DIAGNOSIS — Z9223 Personal history of estrogen therapy: Secondary | ICD-10-CM | POA: Insufficient documentation

## 2018-05-21 DIAGNOSIS — Z853 Personal history of malignant neoplasm of breast: Secondary | ICD-10-CM | POA: Insufficient documentation

## 2018-05-21 DIAGNOSIS — R5383 Other fatigue: Secondary | ICD-10-CM | POA: Insufficient documentation

## 2018-05-21 DIAGNOSIS — D701 Agranulocytosis secondary to cancer chemotherapy: Secondary | ICD-10-CM | POA: Diagnosis not present

## 2018-05-21 DIAGNOSIS — I4891 Unspecified atrial fibrillation: Secondary | ICD-10-CM

## 2018-05-21 DIAGNOSIS — K219 Gastro-esophageal reflux disease without esophagitis: Secondary | ICD-10-CM | POA: Diagnosis not present

## 2018-05-21 DIAGNOSIS — D6959 Other secondary thrombocytopenia: Secondary | ICD-10-CM | POA: Diagnosis not present

## 2018-05-21 DIAGNOSIS — Z800 Family history of malignant neoplasm of digestive organs: Secondary | ICD-10-CM

## 2018-05-21 DIAGNOSIS — K047 Periapical abscess without sinus: Secondary | ICD-10-CM | POA: Insufficient documentation

## 2018-05-21 LAB — COMPREHENSIVE METABOLIC PANEL
ALK PHOS: 87 U/L (ref 38–126)
ALT: 16 U/L (ref 0–44)
ANION GAP: 9 (ref 5–15)
AST: 25 U/L (ref 15–41)
Albumin: 3.4 g/dL — ABNORMAL LOW (ref 3.5–5.0)
BUN: 15 mg/dL (ref 8–23)
CO2: 24 mmol/L (ref 22–32)
Calcium: 9.2 mg/dL (ref 8.9–10.3)
Chloride: 108 mmol/L (ref 98–111)
Creatinine, Ser: 0.86 mg/dL (ref 0.44–1.00)
GFR calc non Af Amer: >60 mL/min (ref >60)
Glucose, Bld: 101 mg/dL — ABNORMAL HIGH (ref 70–99)
POTASSIUM: 3.9 mmol/L (ref 3.5–5.1)
SODIUM: 141 mmol/L (ref 135–145)
TOTAL PROTEIN: 6.7 g/dL (ref 6.5–8.1)
Total Bilirubin: 0.2 mg/dL — ABNORMAL LOW (ref 0.3–1.2)

## 2018-05-21 LAB — CBC WITH DIFFERENTIAL/PLATELET
BASOS ABS: 0 10*3/uL (ref 0–0.1)
BASOS PCT: 1 %
Eosinophils Absolute: 0 10*3/uL (ref 0–0.7)
Eosinophils Relative: 0 %
HEMATOCRIT: 29.2 % — AB (ref 35.0–47.0)
HEMOGLOBIN: 10 g/dL — AB (ref 12.0–16.0)
LYMPHS PCT: 35 %
Lymphs Abs: 1.9 10*3/uL (ref 1.0–3.6)
MCH: 35.9 pg — ABNORMAL HIGH (ref 26.0–34.0)
MCHC: 34.2 g/dL (ref 32.0–36.0)
MCV: 105 fL — ABNORMAL HIGH (ref 80.0–100.0)
MONOS PCT: 17 %
Monocytes Absolute: 0.9 10*3/uL (ref 0.2–0.9)
NEUTROS PCT: 47 %
Neutro Abs: 2.6 10*3/uL (ref 1.4–6.5)
Platelets: 413 10*3/uL (ref 150–440)
RBC: 2.78 MIL/uL — ABNORMAL LOW (ref 3.80–5.20)
RDW: 20 % — ABNORMAL HIGH (ref 11.5–14.5)
WBC: 5.5 10*3/uL (ref 3.6–11.0)

## 2018-05-21 MED ORDER — HEPARIN SOD (PORK) LOCK FLUSH 100 UNIT/ML IV SOLN
500.0000 [IU] | Freq: Once | INTRAVENOUS | Status: AC
  Administered 2018-05-21: 500 [IU] via INTRAVENOUS
  Filled 2018-05-21: qty 5

## 2018-05-21 MED ORDER — SODIUM CHLORIDE 0.9% FLUSH
10.0000 mL | INTRAVENOUS | Status: DC | PRN
  Administered 2018-05-21: 10 mL via INTRAVENOUS
  Filled 2018-05-21: qty 10

## 2018-05-21 NOTE — Progress Notes (Signed)
[place] OFFICE PROGRESS NOTE  Patient Care Team: Jerrol Banana., MD as PCP - General (Family Medicine) Gillis Ends, MD as Referring Physician (Obstetrics and Gynecology) Cammie Sickle, MD as Consulting Physician (Oncology) Corey Skains, MD as Consulting Physician (Cardiology) Marval Regal, NP as Nurse Practitioner (Nurse Practitioner) Manya Silvas, MD as Consulting Physician (Gastroenterology)  Cancer Staging Malignant neoplasm of ovary [facility]) Staging form: Ovary, AJCC 7th Edition - Clinical: Stage IIIC (T3c, N0, M0) - Unsigned    Oncology History   # April- MAY 2016- HIGH GRADE SEROUS OVARIAN CANCER STAGE IIIC; CA-125 +2300+;  s/p OPTIMAL DEBULKING SURGERY   # MAY 2016[name] DD [finished in Sep 19th 2017]  # MAY CT 2017- RECURRENT/Peritoneal carcinomatosis/pelvic implant ~1.2cm/Ca-125-34;   # July 3rd 2017- CARBO-DOXIL q 4W [with neulasta]; CT May 11 2016- slight increase peritoneal / stable nodules. Cont chemo [finished DEC 2017]. Last dose CARBO-07/11/2016; DEC 28th 2017 CT- Increase in peritoneal deposits; increasing Ca 125 [60s]- PLATINUM REFRACTORY  # Jan 2018- Taxol-Avastin x6 cycles; Aug 2018- CR; Aug 2018- start Avastin Maintenance; MARCH 2019- PROGRESSION- STOP avastin.   # MID April 2019- Zejula [300 mg/day]; 4 days- later STOPPED sec to Chest pain;  # June 1st week- Lynparza    # May 2019- TAKASUBO ? [s/p cardiac cath- Dr.Kowalski]  # G-1-2 hand foot syndrome-   # LEFT BREAST CA s/p Lumpec & RT s/p TAM   # Afib [cardiology]; JUNE 2017- MUGA 62%  Jan 2018- MOLECULAR TESTING- ATM DELETERIOUS HETEROZYGOUS mutation; TUMOR BRCA- NEG; Myriad genomic instability- NEGATIVE;   # NGS/Omniseq- BRCA-2 copy loss/ * [april2019]; NEG- BRCA Mutations/N-TRK/MSI-STABLE.  ---------------------------------------------------------------------    DIAGNOSIS: MAY 2017; RECURRENT OVARIAN CA-high-grade  serous/platinum refractory  STAGE: IV        ;GOALS: PALLIATIVE  CURRENT/MOST RECENT THERAPY: June 2nd week  Lynparza      Malignant neoplasm of ovary ([place])   04/08/2018 -  Chemotherapy    The patient had palonosetron (ALOXI) injection 0.25 mg, 0.25 mg, Intravenous,  Once, 2 of 4 cycles Administration: 0.25 mg (04/09/2018), 0.25 mg (04/30/2018) pegfilgrastim-cbqv (UDENYCA) injection 6 mg, 6 mg, Subcutaneous, Once, 1 of 3 cycles Administration: 6 mg (05/01/2018) CARBOplatin (PARAPLATIN) 430 mg in sodium chloride 0.9 % 250 mL chemo infusion, 430 mg (100 % of original dose 430.5 mg), Intravenous,  Once, 2 of 4 cycles Dose modification:   (original dose 430.5 mg, Cycle 1) Administration: 430 mg (04/09/2018), 430 mg (04/30/2018) gemcitabine (GEMZAR) 2,000 mg in sodium chloride 0.9 % 250 mL chemo infusion, 1,976 mg, Intravenous,  Once, 2 of 4 cycles Administration: 2,000 mg (04/09/2018), 2,000 mg (04/30/2018)  for chemotherapy treatment.      Ovarian cancer, unspecified laterality ([place])      INTERVAL HISTORY:  Jamie Conner 77 y.o.  female pleasant patient above history of high-grade serous ovarian cancer currently on [place] gemcitabine is here for follow-up.  Patient is currently status post 2 cycles.  Patient was recently evaluated by dentistry for tooth pain.  She was also evaluated by oral surgeon-awaiting dental extraction tomorrow.  She is currently on amoxicillin 3 times a day.  Noted to have improvement of her tooth pain.  No nausea no vomiting or diarrhea.  Mild fatigue.  Review of Systems  Constitutional: Positive for malaise/fatigue. Negative for chills, diaphoresis, fever and weight loss.  HENT: Negative for nosebleeds and sore throat.   Eyes: Negative for double vision.  Respiratory: Negative for cough, hemoptysis, sputum  production, shortness of breath and wheezing.   Cardiovascular: Negative for chest pain, palpitations, orthopnea and leg swelling.  Gastrointestinal:  Positive for constipation. Negative for abdominal pain, blood in stool, diarrhea, heartburn, melena, nausea and vomiting.  Genitourinary: Negative for dysuria, frequency and urgency.  Musculoskeletal: Negative for back pain and joint pain.  Skin: Negative.  Negative for itching and rash.  Neurological: Negative for dizziness, tingling, focal weakness, weakness and headaches.  Endo/Heme/Allergies: Does not bruise/bleed easily.  Psychiatric/Behavioral: Negative for depression. The patient is not nervous/anxious and does not have insomnia.       PAST MEDICAL HISTORY :  Past Medical History:  Diagnosis Date  . Atrial fibrillation ([place])   . Breast cancer ([place]) 1998  . Cancer of breast ([place]) 1998   Left/radiation  . CINV (chemotherapy-induced nausea and vomiting)   . GERD (gastroesophageal reflux disease)   . Hyperlipidemia   . Hypothyroidism   . Mitral insufficiency   . Ovarian cancer ([place])    s/p BSO optimal tumor debulking May 2016/chemo  . Personal history of chemotherapy since 2016   ovarian cancer  . Personal history of radiation therapy 1998  . PVC (premature ventricular contraction)     PAST SURGICAL HISTORY :   Past Surgical History:  Procedure Laterality Date  . ABDOMINAL HYSTERECTOMY    . BILATERAL SALPINGOOPHORECTOMY  May 2016   with optimal tumor debulking   . BREAST LUMPECTOMY Left 1998  . BREAST SURGERY     Left  . CESAREAN SECTION     x2  . CHOLECYSTECTOMY    . COLONOSCOPY  2006  . DEBULKING N/A 12/22/2014   Procedure: DEBULKING;  Surgeon: Gillis Ends, MD;  Location: [facility] OR[facility];  Service: Gynecology;  Laterality: N/A;  . LAPAROTOMY N/A 12/22/2014   Procedure: EXPLORATORY LAPAROTOMY;  Surgeon: Robert Bellow, MD;  Location: [facility] OR[facility];  Service: General;  Laterality: N/A;  . LAPAROTOMY WITH STAGING N/A 12/22/2014   Procedure: LAPAROTOMY WITH STAGING;  Surgeon: Gillis Ends, MD;  Location: [facility] OR[facility];  Service: Gynecology;  Laterality: N/A;  . LEFT  HEART CATH AND CORONARY ANGIOGRAPHY N/A 12/10/2017   Procedure: LEFT HEART CATH AND CORONARY ANGIOGRAPHY;  Surgeon: Yolonda Kida, MD;  Location: [place] [facility];  Service: Cardiovascular;  Laterality: N/A;  . PERIPHERAL VASCULAR CATHETERIZATION Left 12/22/2014   Procedure: PORTA CATH INSERTION;  Surgeon: Robert Bellow, MD;  Location: [facility];  Service: General;  Laterality: Left;  . PORTACATH PLACEMENT Right 12/22/2014   Procedure: INSERTION PORT-A-CATH;  Surgeon: Robert Bellow, MD;  Location: [facility];  Service: General;  Laterality: Right;  . SALPINGOOPHORECTOMY Bilateral 12/22/2014   Procedure: SALPINGO OOPHORECTOMY;  Surgeon: Gillis Ends, MD;  Location: [facility] OR[facility];  Service: Gynecology;  Laterality: Bilateral;  . UPPER GI ENDOSCOPY  09/25/04   hiatus hernia, schatzki ring and a single gastric polyp    FAMILY HISTORY :   Family History  Problem Relation Age of Onset  . Cancer Mother        breast, throat, and stomach  . Breast cancer Mother   . Heart disease Father   . Pulmonary embolism Sister   . Cancer Brother 60       angiosarcoma of the chest; carcinoid small intestinal tumor  . Hypertension Brother   . Stroke Brother   . Cancer Maternal Aunt        breast cancer  . Cancer Maternal Grandfather 54       pancreatic  SOCIAL HISTORY:   Social History   Tobacco Use  . Smoking status: Never Smoker  . Smokeless tobacco: Never Used  Substance Use Topics  . Alcohol use: No  . Drug use: No    ALLERGIES:  is allergic to carafate [sucralfate] and sulfa antibiotics.  MEDICATIONS:  Current Outpatient Medications  Medication Sig Dispense Refill  . acetaminophen (TYLENOL) 500 MG tablet Take 500 mg by mouth every 6 (six) hours as needed.    Marland Kitchen amoxicillin (AMOXIL) 500 MG capsule Take 1 capsule by mouth 3 (three) times daily.    Marland Kitchen aspirin EC 325 MG tablet Take 1 tablet (325 mg total) by mouth daily. 100 tablet 3  . Cholecalciferol (VITAMIN D) 2000 units  tablet Take 1 tablet (2,000 Units total) by mouth daily. 30 tablet 12  . Docosanol (ABREVA EX) Apply 1 application topically as needed. Fever blisters    . fluticasone (FLONASE) 50 MCG/ACT nasal spray Place 1 spray into both nostrils daily as needed.     Marland Kitchen levothyroxine (SYNTHROID, LEVOTHROID) 50 MCG tablet TAKE ONE TABLET ON AN EMPTY STOMACH WITHA GLASS OF WATER AT LEAST 30 TO 60 MINUTES BEFORE BREAKFAST 30 tablet 12  . lidocaine-prilocaine (EMLA) cream Apply to port area 30-45 mins prior to chemo. 30 g 5  . loratadine (CLARITIN) 10 MG tablet Take 10 mg by mouth daily as needed.     Marland Kitchen LORazepam (ATIVAN) 0.5 MG tablet 1 tablet at night as needed for anxiety/sleep 30 tablet 1  . Lysine 500 MG TABS Take 1 tablet by mouth daily as needed.     . metoprolol succinate (TOPROL-XL) 25 MG 24 hr tablet Take 1 tablet by mouth daily.    . Multiple Vitamin (MULTIVITAMIN) capsule Take 1 capsule by mouth daily.    . pantoprazole (PROTONIX) 40 MG tablet TAKE 1 TABLET BY MOUTH DAILY 30 tablet 1  . polyethylene glycol (MIRALAX / GLYCOLAX) packet Take 17 g by mouth daily as needed.    Marland Kitchen alum & mag hydroxide-simeth (MAALOX/MYLANTA) 200-200-20 MG/5ML suspension Take 15 mLs by mouth every 6 (six) hours as needed for indigestion or heartburn. (Patient not taking: Reported on 04/30/2018) 355 mL 0  . chlorhexidine (PERIDEX) 0.12 % solution     . furosemide (LASIX) 20 MG tablet Take 0.5 tablets (10 mg total) by mouth daily. (Patient not taking: Reported on 05/21/2018) 30 tablet 0  . lactulose (CHRONULAC) 10 GM/15ML solution Take 15 mLs (10 g total) by mouth daily as needed for moderate constipation. (Patient not taking: Reported on 04/30/2018) 236 mL 0  . ondansetron (ZOFRAN) 4 MG tablet One pill 30-60 mins prior to taking zejula to prevent nausea/vomitting. (Patient not taking: Reported on 04/30/2018) 45 tablet 3  . ondansetron (ZOFRAN) 8 MG tablet Take 1 tablet (8 mg total) by mouth every 8 (eight) hours as needed for nausea  or vomiting. (Patient not taking: Reported on 04/30/2018) 20 tablet 4  . prochlorperazine (COMPAZINE) 10 MG tablet Take 1 tablet (10 mg total) by mouth every 6 (six) hours as needed for nausea or vomiting. (Patient not taking: Reported on 04/30/2018) 30 tablet 4   No current facility-administered medications for this visit.    Facility-Administered Medications Ordered in Other Visits  Medication Dose Route Frequency Provider Last Rate Last Dose  . sodium chloride flush (NS) 0.9 % injection 10 mL  10 mL Intravenous PRN Cammie Sickle, MD   10 mL at 05/21/18 0838    PHYSICAL EXAMINATION: ECOG PERFORMANCE STATUS: 1 - Symptomatic  but completely ambulatory  BP 128/82   Pulse 63   Temp 97.6 F (36.4 C) (Tympanic)   Resp 20   Ht 5' 4" (1.626 m)   Wt 193 lb (87.5 kg)   BMI 33.13 kg/m   Filed Weights   05/21/18 0912  Weight: 193 lb (87.5 kg)    Physical Exam  Constitutional: She is oriented to person, place, and time and well-developed, well-nourished, and in no distress.  Accompanied by husband.  She is walking by herself.  HENT:  Head: Normocephalic and atraumatic.  Mouth/Throat: Oropharynx is clear and moist. No oropharyngeal exudate.  Eyes: Pupils are equal, round, and reactive to light.  Neck: Normal range of motion. Neck supple.  Cardiovascular: Normal rate and regular rhythm.  Pulmonary/Chest: No respiratory distress. She has no wheezes.  Abdominal: Soft. Bowel sounds are normal. She exhibits no distension and no mass. There is no tenderness. There is no rebound and no guarding.  Musculoskeletal: Normal range of motion. She exhibits no edema or tenderness.  Neurological: She is alert and oriented to person, place, and time.  Skin: Skin is warm.  Psychiatric: Affect normal.       LABORATORY DATA:  I have reviewed the data as listed    Component Value Date/Time   NA 141 05/21/2018 0838   NA 140 12/14/2014 1037   K 3.9 05/21/2018 0838   K 4.4 12/14/2014 1037    CL 108 05/21/2018 0838   CL 106 12/14/2014 1037   CO2 24 05/21/2018 0838   CO2 26 12/14/2014 1037   GLUCOSE 101 (H) 05/21/2018 0838   GLUCOSE 87 12/14/2014 1037   BUN 15 05/21/2018 0838   BUN 12 12/14/2014 1037   CREATININE 0.86 05/21/2018 0838   CREATININE 0.97 12/14/2014 1037   CALCIUM 9.2 05/21/2018 0838   CALCIUM 9.2 12/14/2014 1037   PROT 6.7 05/21/2018 0838   PROT 7.3 12/14/2014 1037   ALBUMIN 3.4 (L) 05/21/2018 0838   ALBUMIN 3.8 12/14/2014 1037   AST 25 05/21/2018 0838   AST 20 12/14/2014 1037   ALT 16 05/21/2018 0838   ALT 11 (L) 12/14/2014 1037   ALKPHOS 87 05/21/2018 0838   ALKPHOS 138 (H) 12/14/2014 1037   BILITOT 0.2 (L) 05/21/2018 0838   BILITOT 0.5 12/14/2014 1037   GFRNONAA >60 05/21/2018 0838   GFRNONAA 59 (L) 12/14/2014 1037   GFRAA >60 05/21/2018 0838   GFRAA >60 12/14/2014 1037    No results found for: SPEP, UPEP  Lab Results  Component Value Date   WBC 5.5 05/21/2018   NEUTROABS 2.6 05/21/2018   HGB 10.0 (L) 05/21/2018   HCT 29.2 (L) 05/21/2018   MCV 105.0 (H) 05/21/2018   PLT 413 05/21/2018      Chemistry      Component Value Date/Time   NA 141 05/21/2018 0838   NA 140 12/14/2014 1037   K 3.9 05/21/2018 0838   K 4.4 12/14/2014 1037   CL 108 05/21/2018 0838   CL 106 12/14/2014 1037   CO2 24 05/21/2018 0838   CO2 26 12/14/2014 1037   BUN 15 05/21/2018 0838   BUN 12 12/14/2014 1037   CREATININE 0.86 05/21/2018 0838   CREATININE 0.97 12/14/2014 1037   GLU 84 03/16/2014      Component Value Date/Time   CALCIUM 9.2 05/21/2018 0838   CALCIUM 9.2 12/14/2014 1037   ALKPHOS 87 05/21/2018 0838   ALKPHOS 138 (H) 12/14/2014 1037   AST 25 05/21/2018 0838   AST 20  12/14/2014 1037   ALT 16 05/21/2018 0838   ALT 11 (L) 12/14/2014 1037   BILITOT 0.2 (L) 05/21/2018 0838   BILITOT 0.5 12/14/2014 1037       RADIOGRAPHIC STUDIES: I have personally reviewed the radiological images as listed and agreed with the findings in the report. No  results found.   ASSESSMENT & PLAN:  Ovarian cancer, unspecified laterality ([place]) # RECURRENT HIGH GRADE SEROUS ovarian cancer;   AUG 2019-CT abdomen pelvis shows progression-peritoneal/serosal.  Ca125-rising.  Carbo-Gem cycle. STABLE.  Tolerating well except for neutropenia postchemotherapy-tooth infection [see discussion below].  # HOLD carbo-GEM # 3 sec to tooth infection/abcess [see below]; discussed with Dr. Hampton Abbot oral surgeon; feels 1 week is reasonable to start chemo again. Plan for extraction tomorrow.   # Constipation-continue MiraLAX.  Stable  # Bil LE swelling-STABLE. Compression stockings.   #Fatigue-multifactorial stable  # CBC/CMP/Ca-125/MD-GEM-Carbo; Day-2 neulasta in 2 weeks. Given copy of labs.    No orders of the defined types were placed in this encounter.  All questions were answered. The patient knows to call the clinic with any problems, questions or concerns.      Cammie Sickle, MD 05/21/2018 12:47 PM

## 2018-05-21 NOTE — Assessment & Plan Note (Addendum)
#   RECURRENT HIGH GRADE SEROUS ovarian cancer;   AUG 2019-CT abdomen pelvis shows progression-peritoneal/serosal.  Ca125-rising.  Carbo-Gem cycle. STABLE.  Tolerating well except for neutropenia postchemotherapy-tooth infection [see discussion below].  # HOLD carbo-GEM # 3 sec to tooth infection/abcess [see below]; discussed with Dr. Hampton Abbot oral surgeon; feels 1 week is reasonable to start chemo again. Plan for extraction tomorrow.   # Constipation-continue MiraLAX.  Stable  # Bil LE swelling-STABLE. Compression stockings.   #Fatigue-multifactorial stable  # CBC/CMP/Ca-125/MD-GEM-Carbo; Day-2 neulasta in 2 weeks. Given copy of labs.

## 2018-05-22 ENCOUNTER — Inpatient Hospital Stay: Payer: PPO

## 2018-05-22 LAB — CA 125: Cancer Antigen (CA) 125: 292 U/mL — ABNORMAL HIGH (ref 0.0–38.1)

## 2018-06-04 ENCOUNTER — Encounter: Payer: Self-pay | Admitting: Internal Medicine

## 2018-06-04 ENCOUNTER — Other Ambulatory Visit: Payer: Self-pay | Admitting: *Deleted

## 2018-06-04 ENCOUNTER — Inpatient Hospital Stay (HOSPITAL_BASED_OUTPATIENT_CLINIC_OR_DEPARTMENT_OTHER): Payer: PPO | Admitting: Internal Medicine

## 2018-06-04 ENCOUNTER — Inpatient Hospital Stay: Payer: PPO

## 2018-06-04 ENCOUNTER — Other Ambulatory Visit: Payer: Self-pay

## 2018-06-04 VITALS — BP 128/82 | HR 66 | Temp 98.2°F | Resp 20 | Ht 64.0 in | Wt 191.0 lb

## 2018-06-04 DIAGNOSIS — R5383 Other fatigue: Secondary | ICD-10-CM | POA: Diagnosis not present

## 2018-06-04 DIAGNOSIS — C786 Secondary malignant neoplasm of retroperitoneum and peritoneum: Secondary | ICD-10-CM

## 2018-06-04 DIAGNOSIS — C569 Malignant neoplasm of unspecified ovary: Secondary | ICD-10-CM

## 2018-06-04 DIAGNOSIS — K5900 Constipation, unspecified: Secondary | ICD-10-CM | POA: Diagnosis not present

## 2018-06-04 DIAGNOSIS — Z79899 Other long term (current) drug therapy: Secondary | ICD-10-CM

## 2018-06-04 DIAGNOSIS — E039 Hypothyroidism, unspecified: Secondary | ICD-10-CM

## 2018-06-04 DIAGNOSIS — D6959 Other secondary thrombocytopenia: Secondary | ICD-10-CM

## 2018-06-04 DIAGNOSIS — Z5111 Encounter for antineoplastic chemotherapy: Secondary | ICD-10-CM | POA: Diagnosis not present

## 2018-06-04 DIAGNOSIS — R5381 Other malaise: Secondary | ICD-10-CM

## 2018-06-04 DIAGNOSIS — Z9223 Personal history of estrogen therapy: Secondary | ICD-10-CM

## 2018-06-04 DIAGNOSIS — D701 Agranulocytosis secondary to cancer chemotherapy: Secondary | ICD-10-CM

## 2018-06-04 DIAGNOSIS — Z90722 Acquired absence of ovaries, bilateral: Secondary | ICD-10-CM | POA: Diagnosis not present

## 2018-06-04 DIAGNOSIS — T451X5S Adverse effect of antineoplastic and immunosuppressive drugs, sequela: Secondary | ICD-10-CM | POA: Diagnosis not present

## 2018-06-04 DIAGNOSIS — M1990 Unspecified osteoarthritis, unspecified site: Secondary | ICD-10-CM

## 2018-06-04 DIAGNOSIS — K219 Gastro-esophageal reflux disease without esophagitis: Secondary | ICD-10-CM

## 2018-06-04 DIAGNOSIS — I4891 Unspecified atrial fibrillation: Secondary | ICD-10-CM

## 2018-06-04 DIAGNOSIS — R600 Localized edema: Secondary | ICD-10-CM | POA: Diagnosis not present

## 2018-06-04 DIAGNOSIS — K047 Periapical abscess without sinus: Secondary | ICD-10-CM

## 2018-06-04 DIAGNOSIS — Z853 Personal history of malignant neoplasm of breast: Secondary | ICD-10-CM

## 2018-06-04 DIAGNOSIS — Z7982 Long term (current) use of aspirin: Secondary | ICD-10-CM

## 2018-06-04 DIAGNOSIS — Z923 Personal history of irradiation: Secondary | ICD-10-CM

## 2018-06-04 DIAGNOSIS — E785 Hyperlipidemia, unspecified: Secondary | ICD-10-CM

## 2018-06-04 DIAGNOSIS — Z90710 Acquired absence of both cervix and uterus: Secondary | ICD-10-CM | POA: Diagnosis not present

## 2018-06-04 LAB — CBC WITH DIFFERENTIAL/PLATELET
ABS IMMATURE GRANULOCYTES: 0.02 10*3/uL (ref 0.00–0.07)
BASOS PCT: 1 %
Basophils Absolute: 0 10*3/uL (ref 0.0–0.1)
Eosinophils Absolute: 0 10*3/uL (ref 0.0–0.5)
Eosinophils Relative: 1 %
HCT: 31.9 % — ABNORMAL LOW (ref 36.0–46.0)
Hemoglobin: 10 g/dL — ABNORMAL LOW (ref 12.0–15.0)
IMMATURE GRANULOCYTES: 0 %
Lymphocytes Relative: 42 %
Lymphs Abs: 2.1 10*3/uL (ref 0.7–4.0)
MCH: 32.4 pg (ref 26.0–34.0)
MCHC: 31.3 g/dL (ref 30.0–36.0)
MCV: 103.2 fL — ABNORMAL HIGH (ref 80.0–100.0)
Monocytes Absolute: 0.7 10*3/uL (ref 0.1–1.0)
Monocytes Relative: 14 %
NEUTROS ABS: 2.1 10*3/uL (ref 1.7–7.7)
NEUTROS PCT: 42 %
NRBC: 0 % (ref 0.0–0.2)
PLATELETS: 220 10*3/uL (ref 150–400)
RBC: 3.09 MIL/uL — AB (ref 3.87–5.11)
RDW: 16.4 % — ABNORMAL HIGH (ref 11.5–15.5)
WBC: 5 10*3/uL (ref 4.0–10.5)

## 2018-06-04 LAB — COMPREHENSIVE METABOLIC PANEL
ALT: 12 U/L (ref 0–44)
ANION GAP: 7 (ref 5–15)
AST: 21 U/L (ref 15–41)
Albumin: 3.6 g/dL (ref 3.5–5.0)
Alkaline Phosphatase: 78 U/L (ref 38–126)
BILIRUBIN TOTAL: 0.6 mg/dL (ref 0.3–1.2)
BUN: 17 mg/dL (ref 8–23)
CO2: 25 mmol/L (ref 22–32)
Calcium: 9.4 mg/dL (ref 8.9–10.3)
Chloride: 108 mmol/L (ref 98–111)
Creatinine, Ser: 0.93 mg/dL (ref 0.44–1.00)
GFR, EST NON AFRICAN AMERICAN: 59 mL/min — AB (ref >60)
Glucose, Bld: 104 mg/dL — ABNORMAL HIGH (ref 70–99)
POTASSIUM: 3.9 mmol/L (ref 3.5–5.1)
Sodium: 140 mmol/L (ref 135–145)
Total Protein: 6.7 g/dL (ref 6.5–8.1)

## 2018-06-04 MED ORDER — SODIUM CHLORIDE 0.9 % IV SOLN
430.0000 mg | Freq: Once | INTRAVENOUS | Status: AC
  Administered 2018-06-04: 430 mg via INTRAVENOUS
  Filled 2018-06-04: qty 43

## 2018-06-04 MED ORDER — SODIUM CHLORIDE 0.9 % IV SOLN
2000.0000 mg | Freq: Once | INTRAVENOUS | Status: AC
  Administered 2018-06-04: 2000 mg via INTRAVENOUS
  Filled 2018-06-04: qty 52.6

## 2018-06-04 MED ORDER — DEXAMETHASONE SODIUM PHOSPHATE 10 MG/ML IJ SOLN
10.0000 mg | Freq: Once | INTRAMUSCULAR | Status: AC
  Administered 2018-06-04: 10 mg via INTRAVENOUS
  Filled 2018-06-04: qty 1

## 2018-06-04 MED ORDER — HEPARIN SOD (PORK) LOCK FLUSH 100 UNIT/ML IV SOLN
500.0000 [IU] | Freq: Once | INTRAVENOUS | Status: AC | PRN
  Administered 2018-06-04: 500 [IU]
  Filled 2018-06-04: qty 5

## 2018-06-04 MED ORDER — SODIUM CHLORIDE 0.9 % IV SOLN
Freq: Once | INTRAVENOUS | Status: AC
  Administered 2018-06-04: 10:00:00 via INTRAVENOUS
  Filled 2018-06-04: qty 250

## 2018-06-04 MED ORDER — PALONOSETRON HCL INJECTION 0.25 MG/5ML
0.2500 mg | Freq: Once | INTRAVENOUS | Status: AC
  Administered 2018-06-04: 0.25 mg via INTRAVENOUS
  Filled 2018-06-04: qty 5

## 2018-06-04 NOTE — Progress Notes (Signed)
[place] OFFICE PROGRESS NOTE  Patient Care Team: Jerrol Banana., MD as PCP - General (Family Medicine) Gillis Ends, MD as Referring Physician (Obstetrics and Gynecology) Cammie Sickle, MD as Consulting Physician (Oncology) Corey Skains, MD as Consulting Physician (Cardiology) Marval Regal, NP as Nurse Practitioner (Nurse Practitioner) Manya Silvas, MD as Consulting Physician (Gastroenterology)  Cancer Staging Malignant neoplasm of ovary [facility]) Staging form: Ovary, AJCC 7th Edition - Clinical: Stage IIIC (T3c, N0, M0) - Unsigned    Oncology History   # April- MAY 2016- HIGH GRADE SEROUS OVARIAN CANCER STAGE IIIC; CA-125 +2300+;  s/p OPTIMAL DEBULKING SURGERY   # MAY 2016[name] DD [finished in Sep 19th 2017]  # MAY CT 2017- RECURRENT/Peritoneal carcinomatosis/pelvic implant ~1.2cm/Ca-125-34;   # July 3rd 2017- CARBO-DOXIL q 4W [with neulasta]; CT May 11 2016- slight increase peritoneal / stable nodules. Cont chemo [finished DEC 2017]. Last dose CARBO-07/11/2016; DEC 28th 2017 CT- Increase in peritoneal deposits; increasing Ca 125 [60s]- PLATINUM REFRACTORY  # Jan 2018- Taxol-Avastin x6 cycles; Aug 2018- CR; Aug 2018- start Avastin Maintenance; MARCH 2019- PROGRESSION- STOP avastin.   # MID April 2019- Zejula [300 mg/day]; 4 days- later STOPPED sec to Chest pain;  # June 1st weekLonie Conner; AUG 2019- Progression of disease  # AUG, 21st 2019- START CARBO-GEM   # May 2019- TAKASUBO ? [s/p cardiac cath- Dr.Kowalski]  # G-1-2 hand foot syndrome-   # LEFT BREAST CA s/p Lumpec & RT s/p TAM   # Afib [cardiology]; JUNE 2017- MUGA 62%  Jan 2018- MOLECULAR TESTING- ATM DELETERIOUS HETEROZYGOUS mutation; TUMOR BRCA- NEG; Myriad genomic instability- NEGATIVE;   # NGS/Omniseq- BRCA-2 copy loss/ * [april2019]; NEG- BRCA Mutations/N-TRK/MSI-STABLE.  ---------------------------------------------------------------------     DIAGNOSIS: MAY 2017; RECURRENT OVARIAN CA-high-grade serous/platinum refractory  STAGE: IV        ;GOALS: PALLIATIVE  CURRENT/MOST RECENT THERAPY: GEM-CARBO     Malignant neoplasm of ovary ([place])   04/08/2018 -  Chemotherapy    The patient had palonosetron (ALOXI) injection 0.25 mg, 0.25 mg, Intravenous,  Once, 3 of 4 cycles Administration: 0.25 mg (04/09/2018), 0.25 mg (04/30/2018), 0.25 mg (06/04/2018) pegfilgrastim-cbqv (UDENYCA) injection 6 mg, 6 mg, Subcutaneous, Once, 2 of 3 cycles Administration: 6 mg (05/01/2018) CARBOplatin (PARAPLATIN) 430 mg in sodium chloride 0.9 % 250 mL chemo infusion, 430 mg (100 % of original dose 430.5 mg), Intravenous,  Once, 3 of 4 cycles Dose modification:   (original dose 430.5 mg, Cycle 1) Administration: 430 mg (04/09/2018), 430 mg (04/30/2018), 430 mg (06/04/2018) gemcitabine (GEMZAR) 2,000 mg in sodium chloride 0.9 % 250 mL chemo infusion, 1,976 mg, Intravenous,  Once, 3 of 4 cycles Administration: 2,000 mg (04/09/2018), 2,000 mg (04/30/2018), 2,000 mg (06/04/2018)  for chemotherapy treatment.      Ovarian cancer, unspecified laterality ([place])      INTERVAL HISTORY:  Jamie Conner 77 y.o.  female pleasant patient above history of high-grade serous ovarian cancer currently on [place] gemcitabine is here for follow-up.  Patient is currently status post 2 cycles.  Post cycle was held because of recent tooth infection/dental abscess needing extraction approximately 2 weeks ago.  Patient is currently status post antibiotics-overall improved.  Denies any nausea vomiting.  No constipation.  No blood in stools black or stools.  Review of Systems  Constitutional: Positive for malaise/fatigue. Negative for chills, diaphoresis, fever and weight loss.  HENT: Negative for nosebleeds and sore throat.   Eyes: Negative for double vision.  Respiratory: Negative for cough, hemoptysis, sputum production, shortness of breath and wheezing.   Cardiovascular:  Negative for chest pain, palpitations, orthopnea and leg swelling.  Gastrointestinal: Positive for constipation. Negative for abdominal pain, blood in stool, diarrhea, heartburn, melena, nausea and vomiting.  Genitourinary: Negative for dysuria, frequency and urgency.  Musculoskeletal: Negative for back pain and joint pain.  Skin: Negative.  Negative for itching and rash.  Neurological: Negative for dizziness, tingling, focal weakness, weakness and headaches.  Endo/Heme/Allergies: Does not bruise/bleed easily.  Psychiatric/Behavioral: Negative for depression. The patient is not nervous/anxious and does not have insomnia.       PAST MEDICAL HISTORY :  Past Medical History:  Diagnosis Date  . Atrial fibrillation ([place])   . Breast cancer ([place]) 1998  . Cancer of breast ([place]) 1998   Left/radiation  . CINV (chemotherapy-induced nausea and vomiting)   . GERD (gastroesophageal reflux disease)   . Hyperlipidemia   . Hypothyroidism   . Mitral insufficiency   . Ovarian cancer ([place])    s/p BSO optimal tumor debulking May 2016/chemo  . Personal history of chemotherapy since 2016   ovarian cancer  . Personal history of radiation therapy 1998  . PVC (premature ventricular contraction)     PAST SURGICAL HISTORY :   Past Surgical History:  Procedure Laterality Date  . ABDOMINAL HYSTERECTOMY    . BILATERAL SALPINGOOPHORECTOMY  May 2016   with optimal tumor debulking   . BREAST LUMPECTOMY Left 1998  . BREAST SURGERY     Left  . CESAREAN SECTION     x2  . CHOLECYSTECTOMY    . COLONOSCOPY  2006  . DEBULKING N/A 12/22/2014   Procedure: DEBULKING;  Surgeon: Gillis Ends, MD;  Location: [facility];  Service: Gynecology;  Laterality: N/A;  . LAPAROTOMY N/A 12/22/2014   Procedure: EXPLORATORY LAPAROTOMY;  Surgeon: Robert Bellow, MD;  Location: [facility] OR[facility];  Service: General;  Laterality: N/A;  . LAPAROTOMY WITH STAGING N/A 12/22/2014   Procedure: LAPAROTOMY WITH STAGING;  Surgeon: Gillis Ends, MD;  Location: [facility] OR[facility];  Service: Gynecology;  Laterality: N/A;  . LEFT HEART CATH AND CORONARY ANGIOGRAPHY N/A 12/10/2017   Procedure: LEFT HEART CATH AND CORONARY ANGIOGRAPHY;  Surgeon: Yolonda Kida, MD;  Location: [place] [facility];  Service: Cardiovascular;  Laterality: N/A;  . PERIPHERAL VASCULAR CATHETERIZATION Left 12/22/2014   Procedure: PORTA CATH INSERTION;  Surgeon: Robert Bellow, MD;  Location: [facility];  Service: General;  Laterality: Left;  . PORTACATH PLACEMENT Right 12/22/2014   Procedure: INSERTION PORT-A-CATH;  Surgeon: Robert Bellow, MD;  Location: [facility];  Service: General;  Laterality: Right;  . SALPINGOOPHORECTOMY Bilateral 12/22/2014   Procedure: SALPINGO OOPHORECTOMY;  Surgeon: Gillis Ends, MD;  Location: [facility];  Service: Gynecology;  Laterality: Bilateral;  . UPPER GI ENDOSCOPY  09/25/04   hiatus hernia, schatzki ring and a single gastric polyp    FAMILY HISTORY :   Family History  Problem Relation Age of Onset  . Cancer Mother        breast, throat, and stomach  . Breast cancer Mother   . Heart disease Father   . Pulmonary embolism Sister   . Cancer Brother 60       angiosarcoma of the chest; carcinoid small intestinal tumor  . Hypertension Brother   . Stroke Brother   . Cancer Maternal Aunt        breast cancer  . Cancer Maternal Grandfather 63  pancreatic    SOCIAL HISTORY:   Social History   Tobacco Use  . Smoking status: Never Smoker  . Smokeless tobacco: Never Used  Substance Use Topics  . Alcohol use: No  . Drug use: No    ALLERGIES:  is allergic to carafate [sucralfate] and sulfa antibiotics.  MEDICATIONS:  Current Outpatient Medications  Medication Sig Dispense Refill  . acetaminophen (TYLENOL) 500 MG tablet Take 500 mg by mouth every 6 (six) hours as needed.    Marland Kitchen aspirin EC 325 MG tablet Take 1 tablet (325 mg total) by mouth daily. 100 tablet 3  . chlorhexidine (PERIDEX) 0.12 % solution      . Cholecalciferol (VITAMIN D) 2000 units tablet Take 1 tablet (2,000 Units total) by mouth daily. 30 tablet 12  . Docosanol (ABREVA EX) Apply 1 application topically as needed. Fever blisters    . fluticasone (FLONASE) 50 MCG/ACT nasal spray Place 1 spray into both nostrils daily as needed.     Marland Kitchen levothyroxine (SYNTHROID, LEVOTHROID) 50 MCG tablet TAKE ONE TABLET ON AN EMPTY STOMACH WITHA GLASS OF WATER AT LEAST 30 TO 60 MINUTES BEFORE BREAKFAST 30 tablet 12  . lidocaine-prilocaine (EMLA) cream Apply to port area 30-45 mins prior to chemo. 30 g 5  . loratadine (CLARITIN) 10 MG tablet Take 10 mg by mouth daily as needed.     Marland Kitchen LORazepam (ATIVAN) 0.5 MG tablet 1 tablet at night as needed for anxiety/sleep 30 tablet 1  . Lysine 500 MG TABS Take 1 tablet by mouth daily as needed.     . metoprolol succinate (TOPROL-XL) 25 MG 24 hr tablet Take 1 tablet by mouth daily.    . Multiple Vitamin (MULTIVITAMIN) capsule Take 1 capsule by mouth daily.    . pantoprazole (PROTONIX) 40 MG tablet TAKE 1 TABLET BY MOUTH DAILY 30 tablet 1  . alum & mag hydroxide-simeth (MAALOX/MYLANTA) 200-200-20 MG/5ML suspension Take 15 mLs by mouth every 6 (six) hours as needed for indigestion or heartburn. (Patient not taking: Reported on 04/30/2018) 355 mL 0  . furosemide (LASIX) 20 MG tablet Take 0.5 tablets (10 mg total) by mouth daily. (Patient not taking: Reported on 06/04/2018) 30 tablet 0  . lactulose (CHRONULAC) 10 GM/15ML solution Take 15 mLs (10 g total) by mouth daily as needed for moderate constipation. (Patient not taking: Reported on 04/30/2018) 236 mL 0  . ondansetron (ZOFRAN) 4 MG tablet One pill 30-60 mins prior to taking zejula to prevent nausea/vomitting. (Patient not taking: Reported on 04/30/2018) 45 tablet 3  . ondansetron (ZOFRAN) 8 MG tablet Take 1 tablet (8 mg total) by mouth every 8 (eight) hours as needed for nausea or vomiting. (Patient not taking: Reported on 04/30/2018) 20 tablet 4  . polyethylene glycol  (MIRALAX / GLYCOLAX) packet Take 17 g by mouth daily as needed.    . prochlorperazine (COMPAZINE) 10 MG tablet Take 1 tablet (10 mg total) by mouth every 6 (six) hours as needed for nausea or vomiting. (Patient not taking: Reported on 04/30/2018) 30 tablet 4   No current facility-administered medications for this visit.     PHYSICAL EXAMINATION: ECOG PERFORMANCE STATUS: 1 - Symptomatic but completely ambulatory  BP 128/82 (Patient Position: Sitting)   Pulse 66   Temp 98.2 F (36.8 C) (Oral)   Resp 20   Ht '5\' 4"'$  (1.626 m)   Wt 191 lb (86.6 kg)   BMI 32.79 kg/m   Filed Weights   06/04/18 0914  Weight: 191 lb (86.6 kg)  Physical Exam  Constitutional: She is oriented to person, place, and time and well-developed, well-nourished, and in no distress.  Accompanied by husband.  She is walking by herself.  HENT:  Head: Normocephalic and atraumatic.  Mouth/Throat: Oropharynx is clear and moist. No oropharyngeal exudate.  Eyes: Pupils are equal, round, and reactive to light.  Neck: Normal range of motion. Neck supple.  Cardiovascular: Normal rate and regular rhythm.  Pulmonary/Chest: No respiratory distress. She has no wheezes.  Abdominal: Soft. Bowel sounds are normal. She exhibits no distension and no mass. There is no tenderness. There is no rebound and no guarding.  Musculoskeletal: Normal range of motion. She exhibits no edema or tenderness.  Neurological: She is alert and oriented to person, place, and time.  Skin: Skin is warm.  Psychiatric: Affect normal.       LABORATORY DATA:  I have reviewed the data as listed    Component Value Date/Time   NA 140 06/04/2018 0859   NA 140 12/14/2014 1037   K 3.9 06/04/2018 0859   K 4.4 12/14/2014 1037   CL 108 06/04/2018 0859   CL 106 12/14/2014 1037   CO2 25 06/04/2018 0859   CO2 26 12/14/2014 1037   GLUCOSE 104 (H) 06/04/2018 0859   GLUCOSE 87 12/14/2014 1037   BUN 17 06/04/2018 0859   BUN 12 12/14/2014 1037   CREATININE  0.93 06/04/2018 0859   CREATININE 0.97 12/14/2014 1037   CALCIUM 9.4 06/04/2018 0859   CALCIUM 9.2 12/14/2014 1037   PROT 6.7 06/04/2018 0859   PROT 7.3 12/14/2014 1037   ALBUMIN 3.6 06/04/2018 0859   ALBUMIN 3.8 12/14/2014 1037   AST 21 06/04/2018 0859   AST 20 12/14/2014 1037   ALT 12 06/04/2018 0859   ALT 11 (L) 12/14/2014 1037   ALKPHOS 78 06/04/2018 0859   ALKPHOS 138 (H) 12/14/2014 1037   BILITOT 0.6 06/04/2018 0859   BILITOT 0.5 12/14/2014 1037   GFRNONAA 59 (L) 06/04/2018 0859   GFRNONAA 59 (L) 12/14/2014 1037   GFRAA >60 06/04/2018 0859   GFRAA >60 12/14/2014 1037    No results found for: SPEP, UPEP  Lab Results  Component Value Date   WBC 5.0 06/04/2018   NEUTROABS 2.1 06/04/2018   HGB 10.0 (L) 06/04/2018   HCT 31.9 (L) 06/04/2018   MCV 103.2 (H) 06/04/2018   PLT 220 06/04/2018      Chemistry      Component Value Date/Time   NA 140 06/04/2018 0859   NA 140 12/14/2014 1037   K 3.9 06/04/2018 0859   K 4.4 12/14/2014 1037   CL 108 06/04/2018 0859   CL 106 12/14/2014 1037   CO2 25 06/04/2018 0859   CO2 26 12/14/2014 1037   BUN 17 06/04/2018 0859   BUN 12 12/14/2014 1037   CREATININE 0.93 06/04/2018 0859   CREATININE 0.97 12/14/2014 1037   GLU 84 03/16/2014      Component Value Date/Time   CALCIUM 9.4 06/04/2018 0859   CALCIUM 9.2 12/14/2014 1037   ALKPHOS 78 06/04/2018 0859   ALKPHOS 138 (H) 12/14/2014 1037   AST 21 06/04/2018 0859   AST 20 12/14/2014 1037   ALT 12 06/04/2018 0859   ALT 11 (L) 12/14/2014 1037   BILITOT 0.6 06/04/2018 0859   BILITOT 0.5 12/14/2014 1037       RADIOGRAPHIC STUDIES: I have personally reviewed the radiological images as listed and agreed with the findings in the report. No results found.   ASSESSMENT &  PLAN:  Ovarian cancer, unspecified laterality ([place]) # RECURRENT HIGH GRADE SEROUS ovarian cancer;   AUG 2019-CT abdomen pelvis shows progression-peritoneal/serosal. Currently on carbo-Gem.  Clinically stable; but  Ca1 to 5 rising.  # proceed with carb-Gem #3 today; Labs today reviewed;  acceptable for treatment today. Discontinue D-8 sec to prior severe neutropenia/thrombocytopenia/dental abscess. Discussed re: CT scan after 4 cycles; however-await on the tumor markers to make the final decision.  # Dental abscess s/p extraction- improved; s/p anti-biotcs.   # PN- G-1-stable  # Bil LE swelling-stable compression stockings.   #Fatigue-multifactorial stable  #DISPOSITION: # treatment today #3 weeks follow up- CBC/CMP/Ca-125/MD-GEM-Carbo; Day-2 neulasta- Dr.B   Orders Placed This Encounter  Procedures  . CBC with Differential    Standing Status:   Future    Standing Expiration Date:   06/05/2019  . Comprehensive metabolic panel    Standing Status:   Future    Standing Expiration Date:   06/05/2019  . CA 125    Standing Status:   Future    Standing Expiration Date:   06/05/2019   All questions were answered. The patient knows to call the clinic with any problems, questions or concerns.      Cammie Sickle, MD 06/04/2018 9:32 PM

## 2018-06-04 NOTE — Assessment & Plan Note (Addendum)
#   RECURRENT HIGH GRADE SEROUS ovarian cancer;   AUG 2019-CT abdomen pelvis shows progression-peritoneal/serosal. Currently on carbo-Gem.  Clinically stable; but Ca1 to 5 rising.  # proceed with carb-Gem #3 today; Labs today reviewed;  acceptable for treatment today. Discontinue D-8 sec to prior severe neutropenia/thrombocytopenia/dental abscess. Discussed re: CT scan after 4 cycles; however-await on the tumor markers to make the final decision.  # Dental abscess s/p extraction- improved; s/p anti-biotcs.   # PN- G-1-stable  # Bil LE swelling-stable compression stockings.   #Fatigue-multifactorial stable  #DISPOSITION: # treatment today #3 weeks follow up- CBC/CMP/Ca-125/MD-GEM-Carbo; Day-2 neulasta- Dr.B

## 2018-06-05 ENCOUNTER — Inpatient Hospital Stay: Payer: PPO

## 2018-06-05 DIAGNOSIS — Z5111 Encounter for antineoplastic chemotherapy: Secondary | ICD-10-CM | POA: Diagnosis not present

## 2018-06-05 DIAGNOSIS — C569 Malignant neoplasm of unspecified ovary: Secondary | ICD-10-CM

## 2018-06-05 LAB — CA 125: CANCER ANTIGEN (CA) 125: 583 U/mL — AB (ref 0.0–38.1)

## 2018-06-05 MED ORDER — PEGFILGRASTIM-CBQV 6 MG/0.6ML ~~LOC~~ SOSY
6.0000 mg | PREFILLED_SYRINGE | Freq: Once | SUBCUTANEOUS | Status: AC
  Administered 2018-06-05: 6 mg via SUBCUTANEOUS

## 2018-06-16 ENCOUNTER — Other Ambulatory Visit: Payer: Self-pay | Admitting: Internal Medicine

## 2018-06-16 DIAGNOSIS — C569 Malignant neoplasm of unspecified ovary: Secondary | ICD-10-CM

## 2018-06-16 DIAGNOSIS — G4700 Insomnia, unspecified: Secondary | ICD-10-CM

## 2018-06-25 ENCOUNTER — Inpatient Hospital Stay: Payer: PPO | Attending: Internal Medicine

## 2018-06-25 ENCOUNTER — Inpatient Hospital Stay: Payer: PPO

## 2018-06-25 ENCOUNTER — Inpatient Hospital Stay (HOSPITAL_BASED_OUTPATIENT_CLINIC_OR_DEPARTMENT_OTHER): Payer: PPO | Admitting: Internal Medicine

## 2018-06-25 ENCOUNTER — Encounter: Payer: Self-pay | Admitting: Internal Medicine

## 2018-06-25 VITALS — BP 109/65 | HR 65 | Temp 97.8°F | Resp 16 | Wt 189.0 lb

## 2018-06-25 DIAGNOSIS — K5900 Constipation, unspecified: Secondary | ICD-10-CM

## 2018-06-25 DIAGNOSIS — C786 Secondary malignant neoplasm of retroperitoneum and peritoneum: Secondary | ICD-10-CM | POA: Diagnosis not present

## 2018-06-25 DIAGNOSIS — C569 Malignant neoplasm of unspecified ovary: Secondary | ICD-10-CM

## 2018-06-25 DIAGNOSIS — Z90722 Acquired absence of ovaries, bilateral: Secondary | ICD-10-CM | POA: Insufficient documentation

## 2018-06-25 DIAGNOSIS — R5381 Other malaise: Secondary | ICD-10-CM | POA: Diagnosis not present

## 2018-06-25 DIAGNOSIS — T451X5S Adverse effect of antineoplastic and immunosuppressive drugs, sequela: Secondary | ICD-10-CM | POA: Diagnosis not present

## 2018-06-25 DIAGNOSIS — Z9221 Personal history of antineoplastic chemotherapy: Secondary | ICD-10-CM | POA: Insufficient documentation

## 2018-06-25 DIAGNOSIS — Z23 Encounter for immunization: Secondary | ICD-10-CM | POA: Diagnosis not present

## 2018-06-25 DIAGNOSIS — Z5111 Encounter for antineoplastic chemotherapy: Secondary | ICD-10-CM | POA: Insufficient documentation

## 2018-06-25 DIAGNOSIS — Z7689 Persons encountering health services in other specified circumstances: Secondary | ICD-10-CM | POA: Diagnosis not present

## 2018-06-25 DIAGNOSIS — E785 Hyperlipidemia, unspecified: Secondary | ICD-10-CM

## 2018-06-25 DIAGNOSIS — R600 Localized edema: Secondary | ICD-10-CM | POA: Insufficient documentation

## 2018-06-25 DIAGNOSIS — D6959 Other secondary thrombocytopenia: Secondary | ICD-10-CM | POA: Diagnosis not present

## 2018-06-25 DIAGNOSIS — Z90710 Acquired absence of both cervix and uterus: Secondary | ICD-10-CM

## 2018-06-25 DIAGNOSIS — K219 Gastro-esophageal reflux disease without esophagitis: Secondary | ICD-10-CM | POA: Insufficient documentation

## 2018-06-25 DIAGNOSIS — Z79899 Other long term (current) drug therapy: Secondary | ICD-10-CM | POA: Diagnosis not present

## 2018-06-25 DIAGNOSIS — Z800 Family history of malignant neoplasm of digestive organs: Secondary | ICD-10-CM | POA: Insufficient documentation

## 2018-06-25 DIAGNOSIS — K047 Periapical abscess without sinus: Secondary | ICD-10-CM | POA: Insufficient documentation

## 2018-06-25 DIAGNOSIS — Z7982 Long term (current) use of aspirin: Secondary | ICD-10-CM | POA: Diagnosis not present

## 2018-06-25 DIAGNOSIS — E039 Hypothyroidism, unspecified: Secondary | ICD-10-CM | POA: Insufficient documentation

## 2018-06-25 DIAGNOSIS — Z803 Family history of malignant neoplasm of breast: Secondary | ICD-10-CM | POA: Diagnosis not present

## 2018-06-25 DIAGNOSIS — R5383 Other fatigue: Secondary | ICD-10-CM

## 2018-06-25 LAB — COMPREHENSIVE METABOLIC PANEL
ALK PHOS: 88 U/L (ref 38–126)
ALT: 13 U/L (ref 0–44)
AST: 21 U/L (ref 15–41)
Albumin: 3.5 g/dL (ref 3.5–5.0)
Anion gap: 7 (ref 5–15)
BILIRUBIN TOTAL: 0.3 mg/dL (ref 0.3–1.2)
BUN: 16 mg/dL (ref 8–23)
CALCIUM: 9.1 mg/dL (ref 8.9–10.3)
CO2: 24 mmol/L (ref 22–32)
CREATININE: 0.9 mg/dL (ref 0.44–1.00)
Chloride: 107 mmol/L (ref 98–111)
GFR calc Af Amer: >60 mL/min (ref >60)
GLUCOSE: 101 mg/dL — AB (ref 70–99)
Potassium: 4 mmol/L (ref 3.5–5.1)
Sodium: 138 mmol/L (ref 135–145)
TOTAL PROTEIN: 6.8 g/dL (ref 6.5–8.1)

## 2018-06-25 LAB — CBC WITH DIFFERENTIAL/PLATELET
ABS IMMATURE GRANULOCYTES: 0.04 10*3/uL (ref 0.00–0.07)
BASOS PCT: 0 %
Basophils Absolute: 0 10*3/uL (ref 0.0–0.1)
EOS ABS: 0 10*3/uL (ref 0.0–0.5)
EOS PCT: 1 %
HCT: 30.1 % — ABNORMAL LOW (ref 36.0–46.0)
Hemoglobin: 9.7 g/dL — ABNORMAL LOW (ref 12.0–15.0)
Immature Granulocytes: 1 %
Lymphocytes Relative: 31 %
Lymphs Abs: 2.3 10*3/uL (ref 0.7–4.0)
MCH: 32.6 pg (ref 26.0–34.0)
MCHC: 32.2 g/dL (ref 30.0–36.0)
MCV: 101 fL — AB (ref 80.0–100.0)
MONO ABS: 1.2 10*3/uL — AB (ref 0.1–1.0)
Monocytes Relative: 16 %
Neutro Abs: 3.8 10*3/uL (ref 1.7–7.7)
Neutrophils Relative %: 51 %
PLATELETS: 231 10*3/uL (ref 150–400)
RBC: 2.98 MIL/uL — AB (ref 3.87–5.11)
RDW: 17.2 % — AB (ref 11.5–15.5)
WBC: 7.4 10*3/uL (ref 4.0–10.5)
nRBC: 0 % (ref 0.0–0.2)

## 2018-06-25 MED ORDER — PALONOSETRON HCL INJECTION 0.25 MG/5ML
0.2500 mg | Freq: Once | INTRAVENOUS | Status: AC
  Administered 2018-06-25: 0.25 mg via INTRAVENOUS
  Filled 2018-06-25: qty 5

## 2018-06-25 MED ORDER — SODIUM CHLORIDE 0.9 % IV SOLN
430.0000 mg | Freq: Once | INTRAVENOUS | Status: AC
  Administered 2018-06-25: 430 mg via INTRAVENOUS
  Filled 2018-06-25: qty 43

## 2018-06-25 MED ORDER — HEPARIN SOD (PORK) LOCK FLUSH 100 UNIT/ML IV SOLN
500.0000 [IU] | Freq: Once | INTRAVENOUS | Status: AC
  Administered 2018-06-25: 500 [IU] via INTRAVENOUS
  Filled 2018-06-25: qty 5

## 2018-06-25 MED ORDER — SODIUM CHLORIDE 0.9 % IV SOLN
2000.0000 mg | Freq: Once | INTRAVENOUS | Status: AC
  Administered 2018-06-25: 2000 mg via INTRAVENOUS
  Filled 2018-06-25: qty 53

## 2018-06-25 MED ORDER — SODIUM CHLORIDE 0.9% FLUSH
10.0000 mL | INTRAVENOUS | Status: DC | PRN
  Administered 2018-06-25: 10 mL via INTRAVENOUS
  Filled 2018-06-25: qty 10

## 2018-06-25 MED ORDER — DEXAMETHASONE SODIUM PHOSPHATE 10 MG/ML IJ SOLN
10.0000 mg | Freq: Once | INTRAMUSCULAR | Status: AC
  Administered 2018-06-25: 10 mg via INTRAVENOUS
  Filled 2018-06-25: qty 1

## 2018-06-25 MED ORDER — SODIUM CHLORIDE 0.9 % IV SOLN
Freq: Once | INTRAVENOUS | Status: AC
  Administered 2018-06-25: 10:00:00 via INTRAVENOUS
  Filled 2018-06-25: qty 250

## 2018-06-25 NOTE — Progress Notes (Signed)
[place] OFFICE PROGRESS NOTE  Patient Care Team: Jerrol Banana., MD as PCP - General (Family Medicine) Gillis Ends, MD as Referring Physician (Obstetrics and Gynecology) Cammie Sickle, MD as Consulting Physician (Oncology) Corey Skains, MD as Consulting Physician (Cardiology) Marval Regal, NP as Nurse Practitioner (Nurse Practitioner) Manya Silvas, MD as Consulting Physician (Gastroenterology)  Cancer Staging Malignant neoplasm of ovary [facility]) Staging form: Ovary, AJCC 7th Edition - Clinical: Stage IIIC (T3c, N0, M0) - Unsigned    Oncology History   # April- MAY 2016- HIGH GRADE SEROUS OVARIAN CANCER STAGE IIIC; CA-125 +2300+;  s/p OPTIMAL DEBULKING SURGERY   # MAY 2016[name] [finished in Sep 19th 2017]  # MAY CT 2017- RECURRENT/Peritoneal carcinomatosis/pelvic implant ~1.2cm/Ca-125-34;   # July 3rd 2017- CARBO-DOXIL q 4W [with neulasta]; CT May 11 2016- slight increase peritoneal / stable nodules. Cont chemo [finished DEC 2017]. Last dose CARBO-07/11/2016; DEC 28th 2017 CT- Increase in peritoneal deposits; increasing Ca 125 [60s]- PLATINUM REFRACTORY  # Jan 2018- Taxol-Avastin x6 cycles; Aug 2018- CR; Aug 2018- start Avastin Maintenance; MARCH 2019- PROGRESSION- STOP avastin.   # MID April 2019- Zejula [300 mg/day]; 4 days- later STOPPED sec to Chest pain;  # June 1st weekLonie Peak; AUG 2019- Progression of disease  # AUG, 21st 2019- START CARBO-GEM   # May 2019- TAKASUBO ? [s/p cardiac cath- Dr.Kowalski]  # G-1-2 hand foot syndrome-   # LEFT BREAST CA s/p Lumpec & RT s/p TAM   # Afib [cardiology]; JUNE 2017- MUGA 62%  Jan 2018- MOLECULAR TESTING- ATM DELETERIOUS HETEROZYGOUS mutation; TUMOR BRCA- NEG; Myriad genomic instability- NEGATIVE;   # NGS/Omniseq- BRCA-2 copy loss/ * [april2019]; NEG- BRCA Mutations/N-TRK/MSI-STABLE.  ---------------------------------------------------------------------     DIAGNOSIS: MAY 2017; RECURRENT OVARIAN CA-high-grade serous/platinum refractory  STAGE: IV        ;GOALS: PALLIATIVE  CURRENT/MOST RECENT THERAPY: GEM-CARBO     Malignant neoplasm of ovary ([place])   04/08/2018 -  Chemotherapy    The patient had palonosetron (ALOXI) injection 0.25 mg, 0.25 mg, Intravenous,  Once, 4 of 4 cycles Administration: 0.25 mg (04/09/2018), 0.25 mg (04/30/2018), 0.25 mg (06/04/2018), 0.25 mg (06/25/2018) pegfilgrastim-cbqv (UDENYCA) injection 6 mg, 6 mg, Subcutaneous, Once, 3 of 3 cycles Administration: 6 mg (05/01/2018), 6 mg (06/05/2018), 6 mg (06/26/2018) CARBOplatin (PARAPLATIN) 430 mg in sodium chloride 0.9 % 250 mL chemo infusion, 430 mg (100 % of original dose 430.5 mg), Intravenous,  Once, 4 of 4 cycles Dose modification:   (original dose 430.5 mg, Cycle 1) Administration: 430 mg (04/09/2018), 430 mg (04/30/2018), 430 mg (06/04/2018), 430 mg (06/25/2018) gemcitabine (GEMZAR) 2,000 mg in sodium chloride 0.9 % 250 mL chemo infusion, 1,976 mg, Intravenous,  Once, 4 of 4 cycles Administration: 2,000 mg (04/09/2018), 2,000 mg (04/30/2018), 2,000 mg (06/04/2018), 2,000 mg (06/25/2018)  for chemotherapy treatment.      Ovarian cancer, unspecified laterality ([place])      INTERVAL HISTORY:  Jamie Conner 77 y.o.  female pleasant patient above history of high-grade serous ovarian cancer currently on [place] gemcitabine is here for follow-up.  Patient is currently status post 3 cycles.  Patient admits to intermittent constipation.  No nausea no vomiting.  No diarrhea.  No blood in stools black or stools.  No swelling in the neck.  Tooth infection improved.  Review of Systems  Constitutional: Positive for malaise/fatigue. Negative for chills, diaphoresis, fever and weight loss.  HENT: Negative for nosebleeds and sore throat.  Eyes: Negative for double vision.  Respiratory: Negative for cough, hemoptysis, sputum production, shortness of breath and wheezing.    Cardiovascular: Negative for chest pain, palpitations, orthopnea and leg swelling.  Gastrointestinal: Positive for constipation. Negative for abdominal pain, blood in stool, diarrhea, heartburn, melena, nausea and vomiting.  Genitourinary: Negative for dysuria, frequency and urgency.  Musculoskeletal: Negative for back pain and joint pain.  Skin: Negative.  Negative for itching and rash.  Neurological: Negative for dizziness, tingling, focal weakness, weakness and headaches.  Endo/Heme/Allergies: Does not bruise/bleed easily.  Psychiatric/Behavioral: Negative for depression. The patient is not nervous/anxious and does not have insomnia.       PAST MEDICAL HISTORY :  Past Medical History:  Diagnosis Date  . Atrial fibrillation ([place])   . Breast cancer ([place]) 1998  . Cancer of breast ([place]) 1998   Left/radiation  . CINV (chemotherapy-induced nausea and vomiting)   . GERD (gastroesophageal reflux disease)   . Hyperlipidemia   . Hypothyroidism   . Mitral insufficiency   . Ovarian cancer ([place])    s/p BSO optimal tumor debulking May 2016/chemo  . Personal history of chemotherapy since 2016   ovarian cancer  . Personal history of radiation therapy 1998  . PVC (premature ventricular contraction)     PAST SURGICAL HISTORY :   Past Surgical History:  Procedure Laterality Date  . ABDOMINAL HYSTERECTOMY    . BILATERAL SALPINGOOPHORECTOMY  May 2016   with optimal tumor debulking   . BREAST LUMPECTOMY Left 1998  . BREAST SURGERY     Left  . CESAREAN SECTION     x2  . CHOLECYSTECTOMY    . COLONOSCOPY  2006  . DEBULKING N/A 12/22/2014   Procedure: DEBULKING;  Surgeon: Gillis Ends, MD;  Location: [facility] ORS;  Service: Gynecology;  Laterality: N/A;  . LAPAROTOMY N/A 12/22/2014   Procedure: EXPLORATORY LAPAROTOMY;  Surgeon: Robert Bellow, MD;  Location: [facility] ORS;  Service: General;  Laterality: N/A;  . LAPAROTOMY WITH STAGING N/A 12/22/2014   Procedure: LAPAROTOMY WITH STAGING;   Surgeon: Gillis Ends, MD;  Location: [facility] ORS;  Service: Gynecology;  Laterality: N/A;  . LEFT HEART CATH AND CORONARY ANGIOGRAPHY N/A 12/10/2017   Procedure: LEFT HEART CATH AND CORONARY ANGIOGRAPHY;  Surgeon: Yolonda Kida, MD;  Location: [facility];  Service: Cardiovascular;  Laterality: N/A;  . PERIPHERAL VASCULAR CATHETERIZATION Left 12/22/2014   Procedure: PORTA CATH INSERTION;  Surgeon: Robert Bellow, MD;  Location: [facility] OR[facility];  Service: General;  Laterality: Left;  . PORTACATH PLACEMENT Right 12/22/2014   Procedure: INSERTION PORT-A-CATH;  Surgeon: Robert Bellow, MD;  Location: [facility] OR[facility];  Service: General;  Laterality: Right;  . SALPINGOOPHORECTOMY Bilateral 12/22/2014   Procedure: SALPINGO OOPHORECTOMY;  Surgeon: Gillis Ends, MD;  Location: [facility] OR[facility];  Service: Gynecology;  Laterality: Bilateral;  . UPPER GI ENDOSCOPY  09/25/04   hiatus hernia, schatzki ring and a single gastric polyp    FAMILY HISTORY :   Family History  Problem Relation Age of Onset  . Cancer Mother        breast, throat, and stomach  . Breast cancer Mother   . Heart disease Father   . Pulmonary embolism Sister   . Cancer Brother 60       angiosarcoma of the chest; carcinoid small intestinal tumor  . Hypertension Brother   . Stroke Brother   . Cancer Maternal Aunt        breast cancer  . Cancer Maternal  Grandfather 80       pancreatic    SOCIAL HISTORY:   Social History   Tobacco Use  . Smoking status: Never Smoker  . Smokeless tobacco: Never Used  Substance Use Topics  . Alcohol use: No  . Drug use: No    ALLERGIES:  is allergic to carafate [sucralfate] and sulfa antibiotics.  MEDICATIONS:  Current Outpatient Medications  Medication Sig Dispense Refill  . acetaminophen (TYLENOL) 500 MG tablet Take 500 mg by mouth every 6 (six) hours as needed.    Marland Kitchen alum & mag hydroxide-simeth (MAALOX/MYLANTA) 200-200-20 MG/5ML suspension Take 15 mLs by mouth every 6 (six)  hours as needed for indigestion or heartburn. 355 mL 0  . aspirin EC 325 MG tablet Take 1 tablet (325 mg total) by mouth daily. 100 tablet 3  . chlorhexidine (PERIDEX) 0.12 % solution     . Cholecalciferol (VITAMIN D) 2000 units tablet Take 1 tablet (2,000 Units total) by mouth daily. 30 tablet 12  . Docosanol (ABREVA EX) Apply 1 application topically as needed. Fever blisters    . fluticasone (FLONASE) 50 MCG/ACT nasal spray Place 1 spray into both nostrils daily as needed.     . lactulose (CHRONULAC) 10 GM/15ML solution Take 15 mLs (10 g total) by mouth daily as needed for moderate constipation. 236 mL 0  . levothyroxine (SYNTHROID, LEVOTHROID) 50 MCG tablet TAKE ONE TABLET ON AN EMPTY STOMACH WITHA GLASS OF WATER AT LEAST 30 TO 60 MINUTES BEFORE BREAKFAST 30 tablet 12  . lidocaine-prilocaine (EMLA) cream Apply to port area 30-45 mins prior to chemo. 30 g 5  . loratadine (CLARITIN) 10 MG tablet Take 10 mg by mouth daily as needed.     Marland Kitchen LORazepam (ATIVAN) 0.5 MG tablet TAKE ONE TABLET BY MOUTH AT BEDTIME AS NEEDED FOR ANXIETY/SLEEP 30 tablet 0  . Lysine 500 MG TABS Take 1 tablet by mouth daily as needed.     . metoprolol succinate (TOPROL-XL) 25 MG 24 hr tablet Take 1 tablet by mouth daily.    . Multiple Vitamin (MULTIVITAMIN) capsule Take 1 capsule by mouth daily.    . ondansetron (ZOFRAN) 4 MG tablet One pill 30-60 mins prior to taking zejula to prevent nausea/vomitting. 45 tablet 3  . ondansetron (ZOFRAN) 8 MG tablet Take 1 tablet (8 mg total) by mouth every 8 (eight) hours as needed for nausea or vomiting. 20 tablet 4  . pantoprazole (PROTONIX) 40 MG tablet TAKE 1 TABLET BY MOUTH DAILY 30 tablet 1  . polyethylene glycol (MIRALAX / GLYCOLAX) packet Take 17 g by mouth daily as needed.    . prochlorperazine (COMPAZINE) 10 MG tablet Take 1 tablet (10 mg total) by mouth every 6 (six) hours as needed for nausea or vomiting. 30 tablet 4  . furosemide (LASIX) 20 MG tablet Take 0.5 tablets (10 mg  total) by mouth daily. (Patient not taking: Reported on 06/04/2018) 30 tablet 0   No current facility-administered medications for this visit.     PHYSICAL EXAMINATION: ECOG PERFORMANCE STATUS: 1 - Symptomatic but completely ambulatory  BP 109/65 (BP Location: Left Arm, Patient Position: Sitting)   Pulse 65   Temp 97.8 F (36.6 C) (Tympanic)   Resp 16   Wt 189 lb (85.7 kg)   BMI 32.44 kg/m   Filed Weights   06/25/18 0845  Weight: 189 lb (85.7 kg)    Physical Exam  Constitutional: She is oriented to person, place, and time and well-developed, well-nourished, and in no distress.  Accompanied by husband.  She is walking by herself.  HENT:  Head: Normocephalic and atraumatic.  Mouth/Throat: Oropharynx is clear and moist. No oropharyngeal exudate.  Eyes: Pupils are equal, round, and reactive to light.  Neck: Normal range of motion. Neck supple.  Cardiovascular: Normal rate and regular rhythm.  Pulmonary/Chest: No respiratory distress. She has no wheezes.  Abdominal: Soft. Bowel sounds are normal. She exhibits no distension and no mass. There is no tenderness. There is no rebound and no guarding.  Musculoskeletal: Normal range of motion. She exhibits no edema or tenderness.  Neurological: She is alert and oriented to person, place, and time.  Skin: Skin is warm.  Psychiatric: Affect normal.       LABORATORY DATA:  I have reviewed the data as listed    Component Value Date/Time   NA 138 06/25/2018 0826   NA 140 12/14/2014 1037   K 4.0 06/25/2018 0826   K 4.4 12/14/2014 1037   CL 107 06/25/2018 0826   CL 106 12/14/2014 1037   CO2 24 06/25/2018 0826   CO2 26 12/14/2014 1037   GLUCOSE 101 (H) 06/25/2018 0826   GLUCOSE 87 12/14/2014 1037   BUN 16 06/25/2018 0826   BUN 12 12/14/2014 1037   CREATININE 0.90 06/25/2018 0826   CREATININE 0.97 12/14/2014 1037   CALCIUM 9.1 06/25/2018 0826   CALCIUM 9.2 12/14/2014 1037   PROT 6.8 06/25/2018 0826   PROT 7.3 12/14/2014 1037    ALBUMIN 3.5 06/25/2018 0826   ALBUMIN 3.8 12/14/2014 1037   AST 21 06/25/2018 0826   AST 20 12/14/2014 1037   ALT 13 06/25/2018 0826   ALT 11 (L) 12/14/2014 1037   ALKPHOS 88 06/25/2018 0826   ALKPHOS 138 (H) 12/14/2014 1037   BILITOT 0.3 06/25/2018 0826   BILITOT 0.5 12/14/2014 1037   GFRNONAA >60 06/25/2018 0826   GFRNONAA 59 (L) 12/14/2014 1037   GFRAA >60 06/25/2018 0826   GFRAA >60 12/14/2014 1037    No results found for: SPEP, UPEP  Lab Results  Component Value Date   WBC 7.4 06/25/2018   NEUTROABS 3.8 06/25/2018   HGB 9.7 (L) 06/25/2018   HCT 30.1 (L) 06/25/2018   MCV 101.0 (H) 06/25/2018   PLT 231 06/25/2018      Chemistry      Component Value Date/Time   NA 138 06/25/2018 0826   NA 140 12/14/2014 1037   K 4.0 06/25/2018 0826   K 4.4 12/14/2014 1037   CL 107 06/25/2018 0826   CL 106 12/14/2014 1037   CO2 24 06/25/2018 0826   CO2 26 12/14/2014 1037   BUN 16 06/25/2018 0826   BUN 12 12/14/2014 1037   CREATININE 0.90 06/25/2018 0826   CREATININE 0.97 12/14/2014 1037   GLU 84 03/16/2014      Component Value Date/Time   CALCIUM 9.1 06/25/2018 0826   CALCIUM 9.2 12/14/2014 1037   ALKPHOS 88 06/25/2018 0826   ALKPHOS 138 (H) 12/14/2014 1037   AST 21 06/25/2018 0826   AST 20 12/14/2014 1037   ALT 13 06/25/2018 0826   ALT 11 (L) 12/14/2014 1037   BILITOT 0.3 06/25/2018 0826   BILITOT 0.5 12/14/2014 1037       RADIOGRAPHIC STUDIES: I have personally reviewed the radiological images as listed and agreed with the findings in the report. No results found.   ASSESSMENT & PLAN:  Ovarian cancer, unspecified laterality ([place]) # RECURRENT HIGH GRADE SEROUS ovarian cancer;   AUG 2019-CT abdomen pelvis shows  progression-peritoneal/serosal. Currently on carbo-Gem.  Clinically stable; but Ca125- rising/583.   # proceed with carb-Gem #4 today; Labs today reviewed;  acceptable for treatment today. Discontinued D-8 sec to prior severe neutropenia/  thrombocytopenia/dental abscess. Plan CT after this cycle of chemo; ordered today. Has appt with gyn-onc in 2 weeks.   # Dental abscess s/p extraction-resolved.   # PN- G-1-STABLE.   # Bil LE swelling-STABLE.   #Fatigue-multifactorial STABLE.   #DISPOSITION: # treatment today/ as planned.  #2 weeks follow up- CBC/CMP/Ca-125/MD/CT scan prior-Dr.B  Addendum; tumor marker elevated at 632.;  Await imaging as discussed above.  Patient informed.   Orders Placed This Encounter  Procedures  . CT Abdomen Pelvis W Contrast    Standing Status:   Future    Standing Expiration Date:   06/25/2019    Order Specific Question:   ** REASON FOR EXAM (FREE TEXT)    Answer:   ovarian cancer on chemo    Order Specific Question:   If indicated for the ordered procedure, I authorize the administration of contrast media per Radiology protocol    Answer:   Yes    Order Specific Question:   Preferred imaging location?    Answer:   [facility]    Order Specific Question:   Is Oral Contrast requested for this exam?    Answer:   Yes, Per Radiology protocol    Order Specific Question:   Radiology Contrast Protocol - do NOT remove file path    Answer:   [email or web address]   All questions were answered. The patient knows to call the clinic with any problems, questions or concerns.      Cammie Sickle, MD 06/27/2018 1:08 PM

## 2018-06-25 NOTE — Assessment & Plan Note (Addendum)
#   RECURRENT HIGH GRADE SEROUS ovarian cancer;   AUG 2019-CT abdomen pelvis shows progression-peritoneal/serosal. Currently on carbo-Gem.  Clinically stable; but Ca125- rising/583.   # proceed with carb-Gem #4 today; Labs today reviewed;  acceptable for treatment today. Discontinued D-8 sec to prior severe neutropenia/ thrombocytopenia/dental abscess. Plan CT after this cycle of chemo; ordered today. Has appt with gyn-onc in 2 weeks.   # Dental abscess s/p extraction-resolved.   # PN- G-1-STABLE.   # Bil LE swelling-STABLE.   #Fatigue-multifactorial STABLE.   #DISPOSITION: # treatment today/ as planned.  #2 weeks follow up- CBC/CMP/Ca-125/MD/CT scan prior-Dr.B  Addendum; tumor marker elevated at 632.;  Await imaging as discussed above.  Patient informed.

## 2018-06-26 ENCOUNTER — Inpatient Hospital Stay: Payer: PPO

## 2018-06-26 DIAGNOSIS — Z5111 Encounter for antineoplastic chemotherapy: Secondary | ICD-10-CM | POA: Diagnosis not present

## 2018-06-26 DIAGNOSIS — C569 Malignant neoplasm of unspecified ovary: Secondary | ICD-10-CM

## 2018-06-26 LAB — CA 125: CANCER ANTIGEN (CA) 125: 632 U/mL — AB (ref 0.0–38.1)

## 2018-06-26 MED ORDER — PEGFILGRASTIM-CBQV 6 MG/0.6ML ~~LOC~~ SOSY
6.0000 mg | PREFILLED_SYRINGE | Freq: Once | SUBCUTANEOUS | Status: AC
  Administered 2018-06-26: 6 mg via SUBCUTANEOUS

## 2018-06-27 ENCOUNTER — Telehealth: Payer: Self-pay | Admitting: Internal Medicine

## 2018-06-27 NOTE — Telephone Encounter (Signed)
Mychart message sent.

## 2018-07-06 ENCOUNTER — Other Ambulatory Visit: Payer: Self-pay

## 2018-07-06 ENCOUNTER — Emergency Department: Payer: PPO

## 2018-07-06 ENCOUNTER — Inpatient Hospital Stay
Admission: EM | Admit: 2018-07-06 | Discharge: 2018-07-07 | DRG: 864 | Disposition: A | Discharge status: Home / Self Care | Payer: PPO | Attending: Internal Medicine | Admitting: Internal Medicine

## 2018-07-06 ENCOUNTER — Encounter: Payer: Self-pay | Admitting: Medical Oncology

## 2018-07-06 DIAGNOSIS — I4891 Unspecified atrial fibrillation: Secondary | ICD-10-CM | POA: Diagnosis present

## 2018-07-06 DIAGNOSIS — K219 Gastro-esophageal reflux disease without esophagitis: Secondary | ICD-10-CM | POA: Diagnosis present

## 2018-07-06 DIAGNOSIS — Z7982 Long term (current) use of aspirin: Secondary | ICD-10-CM

## 2018-07-06 DIAGNOSIS — R509 Fever, unspecified: Secondary | ICD-10-CM | POA: Diagnosis not present

## 2018-07-06 DIAGNOSIS — Z853 Personal history of malignant neoplasm of breast: Secondary | ICD-10-CM | POA: Diagnosis not present

## 2018-07-06 DIAGNOSIS — C786 Secondary malignant neoplasm of retroperitoneum and peritoneum: Secondary | ICD-10-CM | POA: Diagnosis not present

## 2018-07-06 DIAGNOSIS — J449 Chronic obstructive pulmonary disease, unspecified: Secondary | ICD-10-CM | POA: Diagnosis not present

## 2018-07-06 DIAGNOSIS — F419 Anxiety disorder, unspecified: Secondary | ICD-10-CM | POA: Diagnosis present

## 2018-07-06 DIAGNOSIS — I340 Nonrheumatic mitral (valve) insufficiency: Secondary | ICD-10-CM | POA: Diagnosis present

## 2018-07-06 DIAGNOSIS — K5909 Other constipation: Secondary | ICD-10-CM | POA: Diagnosis present

## 2018-07-06 DIAGNOSIS — D6959 Other secondary thrombocytopenia: Secondary | ICD-10-CM | POA: Diagnosis not present

## 2018-07-06 DIAGNOSIS — Z95828 Presence of other vascular implants and grafts: Secondary | ICD-10-CM

## 2018-07-06 DIAGNOSIS — Z90710 Acquired absence of both cervix and uterus: Secondary | ICD-10-CM

## 2018-07-06 DIAGNOSIS — Z79899 Other long term (current) drug therapy: Secondary | ICD-10-CM

## 2018-07-06 DIAGNOSIS — K0889 Other specified disorders of teeth and supporting structures: Secondary | ICD-10-CM | POA: Diagnosis present

## 2018-07-06 DIAGNOSIS — D6481 Anemia due to antineoplastic chemotherapy: Secondary | ICD-10-CM | POA: Diagnosis not present

## 2018-07-06 DIAGNOSIS — E039 Hypothyroidism, unspecified: Secondary | ICD-10-CM | POA: Diagnosis present

## 2018-07-06 DIAGNOSIS — T451X5A Adverse effect of antineoplastic and immunosuppressive drugs, initial encounter: Secondary | ICD-10-CM | POA: Diagnosis present

## 2018-07-06 DIAGNOSIS — I10 Essential (primary) hypertension: Secondary | ICD-10-CM | POA: Diagnosis not present

## 2018-07-06 DIAGNOSIS — Z888 Allergy status to other drugs, medicaments and biological substances status: Secondary | ICD-10-CM

## 2018-07-06 DIAGNOSIS — K6289 Other specified diseases of anus and rectum: Secondary | ICD-10-CM | POA: Diagnosis present

## 2018-07-06 DIAGNOSIS — Z923 Personal history of irradiation: Secondary | ICD-10-CM

## 2018-07-06 DIAGNOSIS — A419 Sepsis, unspecified organism: Secondary | ICD-10-CM | POA: Diagnosis not present

## 2018-07-06 DIAGNOSIS — C569 Malignant neoplasm of unspecified ovary: Secondary | ICD-10-CM | POA: Diagnosis not present

## 2018-07-06 DIAGNOSIS — R5081 Fever presenting with conditions classified elsewhere: Secondary | ICD-10-CM

## 2018-07-06 DIAGNOSIS — Z882 Allergy status to sulfonamides status: Secondary | ICD-10-CM

## 2018-07-06 DIAGNOSIS — D696 Thrombocytopenia, unspecified: Secondary | ICD-10-CM | POA: Diagnosis not present

## 2018-07-06 LAB — COMPREHENSIVE METABOLIC PANEL
ALT: 18 U/L (ref 0–44)
ANION GAP: 9 (ref 5–15)
AST: 28 U/L (ref 15–41)
Albumin: 3.5 g/dL (ref 3.5–5.0)
Alkaline Phosphatase: 119 U/L (ref 38–126)
BUN: 11 mg/dL (ref 8–23)
CHLORIDE: 105 mmol/L (ref 98–111)
CO2: 24 mmol/L (ref 22–32)
Calcium: 9.1 mg/dL (ref 8.9–10.3)
Creatinine, Ser: 0.94 mg/dL (ref 0.44–1.00)
GFR calc non Af Amer: 58 mL/min — ABNORMAL LOW (ref >60)
Glucose, Bld: 114 mg/dL — ABNORMAL HIGH (ref 70–99)
POTASSIUM: 3.5 mmol/L (ref 3.5–5.1)
SODIUM: 138 mmol/L (ref 135–145)
Total Bilirubin: 0.6 mg/dL (ref 0.3–1.2)
Total Protein: 7 g/dL (ref 6.5–8.1)

## 2018-07-06 LAB — CBC WITH DIFFERENTIAL/PLATELET
Abs Immature Granulocytes: 0.12 10*3/uL — ABNORMAL HIGH (ref 0.00–0.07)
BASOS ABS: 0 10*3/uL (ref 0.0–0.1)
Basophils Relative: 0 %
Eosinophils Absolute: 0 10*3/uL (ref 0.0–0.5)
Eosinophils Relative: 0 %
HEMATOCRIT: 28.9 % — AB (ref 36.0–46.0)
HEMOGLOBIN: 9.2 g/dL — AB (ref 12.0–15.0)
IMMATURE GRANULOCYTES: 1 %
LYMPHS PCT: 16 %
Lymphs Abs: 1.8 10*3/uL (ref 0.7–4.0)
MCH: 31.7 pg (ref 26.0–34.0)
MCHC: 31.8 g/dL (ref 30.0–36.0)
MCV: 99.7 fL (ref 80.0–100.0)
Monocytes Absolute: 1.4 10*3/uL — ABNORMAL HIGH (ref 0.1–1.0)
Monocytes Relative: 13 %
NEUTROS ABS: 7.5 10*3/uL (ref 1.7–7.7)
NEUTROS PCT: 70 %
NRBC: 0 % (ref 0.0–0.2)
Platelets: 78 10*3/uL — ABNORMAL LOW (ref 150–400)
RBC: 2.9 MIL/uL — ABNORMAL LOW (ref 3.87–5.11)
RDW: 18.5 % — AB (ref 11.5–15.5)
WBC: 10.9 10*3/uL — ABNORMAL HIGH (ref 4.0–10.5)

## 2018-07-06 LAB — URINALYSIS, COMPLETE (UACMP) WITH MICROSCOPIC
Bacteria, UA: NONE SEEN
Bilirubin Urine: NEGATIVE
Glucose, UA: NEGATIVE mg/dL
HGB URINE DIPSTICK: NEGATIVE
Ketones, ur: NEGATIVE mg/dL
LEUKOCYTES UA: NEGATIVE
Nitrite: NEGATIVE
Protein, ur: NEGATIVE mg/dL
SPECIFIC GRAVITY, URINE: 1.012 (ref 1.005–1.030)
pH: 7 (ref 5.0–8.0)

## 2018-07-06 LAB — PROTIME-INR
INR: 1.13
PROTHROMBIN TIME: 14.4 s (ref 11.4–15.2)

## 2018-07-06 LAB — CG4 I-STAT (LACTIC ACID)
LACTIC ACID, VENOUS: 1.83 mmol/L (ref 0.5–1.9)
Lactic Acid, Venous: 0.91 mmol/L (ref 0.5–1.9)

## 2018-07-06 LAB — INFLUENZA PANEL BY PCR (TYPE A & B)
INFLAPCR: NEGATIVE
INFLBPCR: NEGATIVE

## 2018-07-06 MED ORDER — ONDANSETRON HCL 4 MG/2ML IJ SOLN: 4.0000 mg | Freq: Four times a day (QID) | INTRAMUSCULAR | Status: DC | PRN

## 2018-07-06 MED ORDER — ENOXAPARIN SODIUM 40 MG/0.4ML ~~LOC~~ SOLN
40.0000 mg | SUBCUTANEOUS | Status: DC
  Administered 2018-07-06: 40 mg via SUBCUTANEOUS

## 2018-07-06 MED ORDER — VITAMIN D 25 MCG (1000 UNIT) PO TABS
2000.0000 [IU] | ORAL_TABLET | Freq: Every day | ORAL | Status: DC
  Administered 2018-07-07: 2000 [IU] via ORAL
  Filled 2018-07-06: qty 2

## 2018-07-06 MED ORDER — METRONIDAZOLE IN NACL 5-0.79 MG/ML-% IV SOLN
500.0000 mg | Freq: Three times a day (TID) | INTRAVENOUS | Status: DC
  Administered 2018-07-06: 500 mg via INTRAVENOUS
  Filled 2018-07-06: qty 100

## 2018-07-06 MED ORDER — ADULT MULTIVITAMIN W/MINERALS CH
1.0000 | ORAL_TABLET | Freq: Every day | ORAL | Status: DC
  Administered 2018-07-07: 1 via ORAL
  Filled 2018-07-06: qty 1

## 2018-07-06 MED ORDER — PROCHLORPERAZINE MALEATE 10 MG PO TABS
10.0000 mg | ORAL_TABLET | Freq: Four times a day (QID) | ORAL | Status: DC | PRN
  Filled 2018-07-06: qty 1

## 2018-07-06 MED ORDER — LORAZEPAM 0.5 MG PO TABS: 0.5000 mg | ORAL_TABLET | Freq: Every evening | ORAL | Status: DC | PRN

## 2018-07-06 MED ORDER — SODIUM CHLORIDE 0.9 % IV SOLN
INTRAVENOUS | Status: DC | PRN
  Administered 2018-07-06: 18:00:00 500 mL via INTRAVENOUS

## 2018-07-06 MED ORDER — SODIUM CHLORIDE 0.9 % IV SOLN: 2.0000 g | Freq: Two times a day (BID) | INTRAVENOUS | Status: DC

## 2018-07-06 MED ORDER — VANCOMYCIN HCL IN DEXTROSE 750-5 MG/150ML-% IV SOLN
750.0000 mg | Freq: Two times a day (BID) | INTRAVENOUS | Status: DC
  Administered 2018-07-06 – 2018-07-07 (×2): 750 mg via INTRAVENOUS
  Filled 2018-07-06 (×5): qty 150

## 2018-07-06 MED ORDER — ACETAMINOPHEN 650 MG RE SUPP: 650.0000 mg | Freq: Four times a day (QID) | RECTAL | Status: DC | PRN

## 2018-07-06 MED ORDER — METOPROLOL SUCCINATE ER 25 MG PO TB24
25.0000 mg | ORAL_TABLET | Freq: Every day | ORAL | Status: DC
  Administered 2018-07-07: 08:00:00 25 mg via ORAL
  Filled 2018-07-06: qty 1

## 2018-07-06 MED ORDER — ASPIRIN EC 325 MG PO TBEC
325.0000 mg | DELAYED_RELEASE_TABLET | Freq: Every day | ORAL | Status: DC
  Filled 2018-07-06: qty 1

## 2018-07-06 MED ORDER — SODIUM CHLORIDE 0.9 % IV SOLN
2.0000 g | Freq: Once | INTRAVENOUS | Status: AC
  Administered 2018-07-06: 2 g via INTRAVENOUS
  Filled 2018-07-06: qty 2

## 2018-07-06 MED ORDER — PIPERACILLIN-TAZOBACTAM 3.375 G IVPB
3.3750 g | Freq: Three times a day (TID) | INTRAVENOUS | Status: DC
  Administered 2018-07-06 – 2018-07-07 (×2): 3.375 g via INTRAVENOUS
  Filled 2018-07-06 (×2): qty 50

## 2018-07-06 MED ORDER — ENOXAPARIN SODIUM 40 MG/0.4ML ~~LOC~~ SOLN
40.0000 mg | SUBCUTANEOUS | Status: DC
  Filled 2018-07-06: qty 0.4

## 2018-07-06 MED ORDER — LEVOTHYROXINE SODIUM 50 MCG PO TABS
50.0000 ug | ORAL_TABLET | Freq: Every day | ORAL | Status: DC
  Administered 2018-07-07: 06:00:00 50 ug via ORAL
  Filled 2018-07-06: qty 1

## 2018-07-06 MED ORDER — POLYETHYLENE GLYCOL 3350 17 G PO PACK
17.0000 g | PACK | Freq: Every day | ORAL | Status: DC | PRN
  Administered 2018-07-06: 17 g via ORAL
  Filled 2018-07-06: qty 1

## 2018-07-06 MED ORDER — PANTOPRAZOLE SODIUM 40 MG PO TBEC
40.0000 mg | DELAYED_RELEASE_TABLET | Freq: Every day | ORAL | Status: DC
  Administered 2018-07-07: 08:00:00 40 mg via ORAL
  Filled 2018-07-06: qty 1

## 2018-07-06 MED ORDER — ALUM & MAG HYDROXIDE-SIMETH 200-200-20 MG/5ML PO SUSP: 15.0000 mL | Freq: Four times a day (QID) | ORAL | Status: DC | PRN

## 2018-07-06 MED ORDER — ACETAMINOPHEN 325 MG PO TABS: 650.0000 mg | ORAL_TABLET | Freq: Four times a day (QID) | ORAL | Status: DC | PRN

## 2018-07-06 MED ORDER — LORATADINE 10 MG PO TABS: 10.0000 mg | ORAL_TABLET | Freq: Every day | ORAL | Status: DC | PRN

## 2018-07-06 MED ORDER — ONDANSETRON HCL 4 MG PO TABS: 4.0000 mg | ORAL_TABLET | Freq: Four times a day (QID) | ORAL | Status: DC | PRN

## 2018-07-06 MED ORDER — VANCOMYCIN HCL IN DEXTROSE 1-5 GM/200ML-% IV SOLN
1000.0000 mg | Freq: Once | INTRAVENOUS | Status: AC
  Administered 2018-07-06: 1000 mg via INTRAVENOUS
  Filled 2018-07-06: qty 200

## 2018-07-06 NOTE — ED Triage Notes (Addendum)
Pt sent over by oncologist for fever that began last night. Pt reports just generalized weakness and rectal pain that is not new. Recent dental abscess per MD. Currently being treated for ovarian CA, last tx was 1.5 week ago.

## 2018-07-06 NOTE — Plan of Care (Signed)
Pt admitted today from the ED. VSS. Denies pain.  

## 2018-07-06 NOTE — Consult Note (Signed)
[facility]  Date of admission:  07/06/2018  Inpatient day:  07/06/2018  Consulting physician: Dr. Abel Presto.  Reason for Consultation:  Fever of unknown origin.  hx of ovarian cancer undergoing treatment.  Chief Complaint: Jamie Conner is a 77 y.o. female with recurrent high grade serous ovarian cancer currently day 12 s/p cycle #4 carboplatin and gemcitabine who was admitted through the ER with fever.  HPI:  The patient notes a history of infection gum with failed root canal approximately 4 weeks ago.  She was initially placed on amoxicillin.  The tooth was pulled.  She was placed on clindamycin.  Antibiotic completed about 2 weeks ago.  She was seen in the medical oncology clinic on 06/25/2018 by Dr. Rogue Bussing.  She was doing well.  She received cycle #4 carboplatin and gemcitabine followed by Margarette Canada the following day.  CA125 had increased to 632.  She is scheduled for follow-up CT scans on 07/08/2018.  She stats that she noted a lump under her left jaw again.  Temperature increased to 99.  Her dentist prescribed clindamycin and Flagyl.  She did not take either medication as the swelling went down on its own within 2 days.  Yesterday, she had a fever of 101.4.  She denied any oral pain or lymph node swelling.  She notes rectal pain.  She has issues with constipation for which she uses Miralax.  She feels weak and fatigued.  She states that she has seen Dawson Bills, NP and Dr Vira Agar for her rectal pain.  Notes from 01/27/2018 reviewed.  She was felt to have rectal discomfort from her ovarian cancer.  Abdomen and pelvic CT on 10/23/2017 revealed apparent increase soft tissue thickening around the cecal base which appeared new from previous exam.   PET scan on 11/04/2017 revealed multiple small hypermetabolic foci of tumor observed along the bowel and pelvic mesentery, as well as the vaginal cuff, compatible with early spread of ovarian cancer.    Abdomen and  pelvic CT on 03/28/2018 revealed progressive multifocal peritoneal metastatic disease. There was no significant ascites. Several of the lesions were associated with the colonic serosa, including one which protrudeed into the lumen of the distal sigmoid colon.   Past Medical History:  Diagnosis Date  . Atrial fibrillation ([place])   . Breast cancer ([place]) 1998  . Cancer of breast ([place]) 1998   Left/radiation  . CINV (chemotherapy-induced nausea and vomiting)   . GERD (gastroesophageal reflux disease)   . Hyperlipidemia   . Hypothyroidism   . Mitral insufficiency   . Ovarian cancer ([place])    s/p BSO optimal tumor debulking May 2016/chemo  . Personal history of chemotherapy since 2016   ovarian cancer  . Personal history of radiation therapy 1998  . PVC (premature ventricular contraction)     Past Surgical History:  Procedure Laterality Date  . ABDOMINAL HYSTERECTOMY    . BILATERAL SALPINGOOPHORECTOMY  May 2016   with optimal tumor debulking   . BREAST LUMPECTOMY Left 1998  . BREAST SURGERY     Left  . CESAREAN SECTION     x2  . CHOLECYSTECTOMY    . COLONOSCOPY  2006  . DEBULKING N/A 12/22/2014   Procedure: DEBULKING;  Surgeon: Gillis Ends, MD;  Location: [facility];  Service: Gynecology;  Laterality: N/A;  . LAPAROTOMY N/A 12/22/2014   Procedure: EXPLORATORY LAPAROTOMY;  Surgeon: Robert Bellow, MD;  Location: [facility];  Service: General;  Laterality: N/A;  . LAPAROTOMY WITH  STAGING N/A 12/22/2014   Procedure: LAPAROTOMY WITH STAGING;  Surgeon: Gillis Ends, MD;  Location: [facility];  Service: Gynecology;  Laterality: N/A;  . LEFT HEART CATH AND CORONARY ANGIOGRAPHY N/A 12/10/2017   Procedure: LEFT HEART CATH AND CORONARY ANGIOGRAPHY;  Surgeon: Yolonda Kida, MD;  Location: [facility];  Service: Cardiovascular;  Laterality: N/A;  . PERIPHERAL VASCULAR CATHETERIZATION Left 12/22/2014   Procedure: PORTA CATH INSERTION;  Surgeon: Robert Bellow, MD;   Location: [facility];  Service: General;  Laterality: Left;  . PORTACATH PLACEMENT Right 12/22/2014   Procedure: INSERTION PORT-A-CATH;  Surgeon: Robert Bellow, MD;  Location: [facility];  Service: General;  Laterality: Right;  . SALPINGOOPHORECTOMY Bilateral 12/22/2014   Procedure: SALPINGO OOPHORECTOMY;  Surgeon: Gillis Ends, MD;  Location: [facility];  Service: Gynecology;  Laterality: Bilateral;  . UPPER GI ENDOSCOPY  09/25/04   hiatus hernia, schatzki ring and a single gastric polyp    Family History  Problem Relation Age of Onset  . Cancer Mother        breast, throat, and stomach  . Breast cancer Mother   . Heart disease Father   . Pulmonary embolism Sister   . Cancer Brother 60       angiosarcoma of the chest; carcinoid small intestinal tumor  . Hypertension Brother   . Stroke Brother   . Cancer Maternal Aunt        breast cancer  . Cancer Maternal Grandfather 77       pancreatic    Social History:  reports that she has never smoked. She has never used smokeless tobacco. She reports that she does not drink alcohol or use drugs.  The patient is alone today.  Allergies:  Allergies  Allergen Reactions  . Carafate [Sucralfate] Rash  . Sulfa Antibiotics Rash    Medications Prior to Admission  Medication Sig Dispense Refill  . acetaminophen (TYLENOL) 500 MG tablet Take 500 mg by mouth every 6 (six) hours as needed.    Marland Kitchen alum & mag hydroxide-simeth (MAALOX/MYLANTA) 200-200-20 MG/5ML suspension Take 15 mLs by mouth every 6 (six) hours as needed for indigestion or heartburn. 355 mL 0  . aspirin EC 325 MG tablet Take 1 tablet (325 mg total) by mouth daily. 100 tablet 3  . chlorhexidine (PERIDEX) 0.12 % solution     . Cholecalciferol (VITAMIN D) 2000 units tablet Take 1 tablet (2,000 Units total) by mouth daily. 30 tablet 12  . Docosanol (ABREVA EX) Apply 1 application topically as needed. Fever blisters    . fluticasone (FLONASE) 50 MCG/ACT nasal spray Place 1 spray into  both nostrils daily as needed.     . lactulose (CHRONULAC) 10 GM/15ML solution Take 15 mLs (10 g total) by mouth daily as needed for moderate constipation. 236 mL 0  . levothyroxine (SYNTHROID, LEVOTHROID) 50 MCG tablet TAKE ONE TABLET ON AN EMPTY STOMACH WITHA GLASS OF WATER AT LEAST 30 TO 60 MINUTES BEFORE BREAKFAST 30 tablet 12  . lidocaine-prilocaine (EMLA) cream Apply to port area 30-45 mins prior to chemo. 30 g 5  . loratadine (CLARITIN) 10 MG tablet Take 10 mg by mouth daily as needed.     Marland Kitchen LORazepam (ATIVAN) 0.5 MG tablet TAKE ONE TABLET BY MOUTH AT BEDTIME AS NEEDED FOR ANXIETY/SLEEP 30 tablet 0  . Lysine 500 MG TABS Take 1 tablet by mouth daily as needed.     . metoprolol succinate (TOPROL-XL) 25 MG 24 hr tablet Take 1 tablet  by mouth daily.    . Multiple Vitamin (MULTIVITAMIN) capsule Take 1 capsule by mouth daily.    . ondansetron (ZOFRAN) 4 MG tablet One pill 30-60 mins prior to taking zejula to prevent nausea/vomitting. 45 tablet 3  . ondansetron (ZOFRAN) 8 MG tablet Take 1 tablet (8 mg total) by mouth every 8 (eight) hours as needed for nausea or vomiting. 20 tablet 4  . pantoprazole (PROTONIX) 40 MG tablet TAKE 1 TABLET BY MOUTH DAILY 30 tablet 1  . polyethylene glycol (MIRALAX / GLYCOLAX) packet Take 17 g by mouth daily as needed.    . prochlorperazine (COMPAZINE) 10 MG tablet Take 1 tablet (10 mg total) by mouth every 6 (six) hours as needed for nausea or vomiting. 30 tablet 4  . furosemide (LASIX) 20 MG tablet Take 0.5 tablets (10 mg total) by mouth daily. (Patient not taking: Reported on 06/04/2018) 30 tablet 0    Review of Systems: GENERAL:  Fatigue.  Fever up to 101.4.  No chills or sweats.  No weight loss. PERFORMANCE STATUS (ECOG):  2 HEENT:  h/o left lower dental infection.  No visual changes, runny nose, sore throat, mouth sores or tenderness. Lungs: No shortness of breath or cough.  No hemoptysis. Cardiac:  No chest pain, palpitations, orthopnea, or PND. GI:   Constipation.  Rectal discomfort.  No nausea, vomiting, diarrhea, melena or hematochezia. GU:  No urgency, frequency, dysuria, or hematuria. Musculoskeletal:  No back pain.  No joint pain.  No muscle tenderness. Extremities:  No pain or swelling. Skin:  No rashes or skin changes. Neuro:  Mild headache.  No numbness or weakness, balance or coordination issues. Endocrine:  No diabetes.  Thyroid disease on Synthroid.  No hot flashes or night sweats. Psych:  No mood changes, depression or anxiety. Pain:  Rectal discomfort, not new. Review of systems:  All other systems reviewed and found to be negative.  Physical Exam:  Blood pressure (!) 118/51, pulse 75, temperature 99.2 F (37.3 C), temperature source Oral, resp. rate 15, height '5\' 4"'$  (1.626 m), weight 188 lb (85.3 kg), SpO2 98 %.  GENERAL:  Well developed, well nourished, woman lying comfortably on the medical unit in no acute distress. MENTAL STATUS:  Alert and oriented to person, place and time. HEAD:  Short styled gray hair.  Normocephalic, atraumatic, face symmetric, no Cushingoid features. EYES:  Blue eyes.  Pupils equal round and reactive to light and accomodation.  No conjunctivitis or scleral icterus. ENT:  Oropharynx clear without lesion.  Tongue normal.   Left lower jaw s/p extraction with non-inflamed gum.  Mucous membranes moist.  RESPIRATORY:  Clear to auscultation without rales, wheezes or rhonchi. CARDIOVASCULAR:  Regular rate and rhythm without murmur, rub or gallop. CHEST WALL:  Left sided port-a-cath site unremarkable. ABDOMEN:  Soft, non-tender, with active bowel sounds, and no hepatosplenomegaly.  No masses. RECTAL:  Deferred.  Patient states examined in ER. SKIN:  No rashes, ulcers or lesions. EXTREMITIES: No edema, no skin discoloration or tenderness.  No palpable cords. LYMPH NODES: No palpable cervical, supraclavicular, axillary or inguinal adenopathy  NEUROLOGICAL: Unremarkable. PSYCH:  Appropriate.   Results  for orders placed or performed during the hospital encounter of 07/06/18 (from the past 48 hour(s))  CG4 I-STAT (Lactic acid)     Status: None   Collection Time: 07/06/18  9:05 AM  Result Value Ref Range   Lactic Acid, Venous 1.83 0.5 - 1.9 mmol/L  Comprehensive metabolic panel     Status: Abnormal  Collection Time: 07/06/18  9:06 AM  Result Value Ref Range   Sodium 138 135 - 145 mmol/L   Potassium 3.5 3.5 - 5.1 mmol/L   Chloride 105 98 - 111 mmol/L   CO2 24 22 - 32 mmol/L   Glucose, Bld 114 (H) 70 - 99 mg/dL   BUN 11 8 - 23 mg/dL   Creatinine, Ser 0.94 0.44 - 1.00 mg/dL   Calcium 9.1 8.9 - 10.3 mg/dL   Total Protein 7.0 6.5 - 8.1 g/dL   Albumin 3.5 3.5 - 5.0 g/dL   AST 28 15 - 41 U/L   ALT 18 0 - 44 U/L   Alkaline Phosphatase 119 38 - 126 U/L   Total Bilirubin 0.6 0.3 - 1.2 mg/dL   GFR calc non Af Amer 58 (L) >60 mL/min   GFR calc Af Amer >60 >60 mL/min    Comment: (NOTE) The eGFR has been calculated using the CKD EPI equation. This calculation has not been validated in all clinical situations. eGFR's persistently <60 mL/min signify possible Chronic Kidney Disease.    Anion gap 9 5 - 15    Comment: Performed at [facility], [address] [place], [place] [address]  CBC WITH DIFFERENTIAL     Status: Abnormal   Collection Time: 07/06/18  9:06 AM  Result Value Ref Range   WBC 10.9 (H) 4.0 - 10.5 K/uL   RBC 2.90 (L) 3.87 - 5.11 MIL/uL   Hemoglobin 9.2 (L) 12.0 - 15.0 g/dL   HCT 28.9 (L) 36.0 - 46.0 %   MCV 99.7 80.0 - 100.0 fL   MCH 31.7 26.0 - 34.0 pg   MCHC 31.8 30.0 - 36.0 g/dL   RDW 18.5 (H) 11.5 - 15.5 %   Platelets 78 (L) 150 - 400 K/uL    Comment: Immature Platelet Fraction may be clinically indicated, consider ordering this additional test [id number]    nRBC 0.0 0.0 - 0.2 %   Neutrophils Relative % 70 %   Neutro Abs 7.5 1.7 - 7.7 K/uL   Lymphocytes Relative 16 %   Lymphs Abs 1.8 0.7 - 4.0 K/uL   Monocytes Relative 13 %   Monocytes Absolute 1.4  (H) 0.1 - 1.0 K/uL   Eosinophils Relative 0 %   Eosinophils Absolute 0.0 0.0 - 0.5 K/uL   Basophils Relative 0 %   Basophils Absolute 0.0 0.0 - 0.1 K/uL   Immature Granulocytes 1 %   Abs Immature Granulocytes 0.12 (H) 0.00 - 0.07 K/uL    Comment: Performed at [facility], [address] [place], [place] [address]  Protime-INR     Status: None   Collection Time: 07/06/18  9:06 AM  Result Value Ref Range   Prothrombin Time 14.4 11.4 - 15.2 seconds   INR 1.13     Comment: Performed at [facility], [address] [place], [place] [address]  Urinalysis, Complete w Microscopic     Status: Abnormal   Collection Time: 07/06/18  9:06 AM  Result Value Ref Range   Color, Urine YELLOW (A) YELLOW   APPearance CLEAR (A) CLEAR   Specific Gravity, Urine 1.012 1.005 - 1.030   pH 7.0 5.0 - 8.0   Glucose, UA NEGATIVE NEGATIVE mg/dL   Hgb urine dipstick NEGATIVE NEGATIVE   Bilirubin Urine NEGATIVE NEGATIVE   Ketones, ur NEGATIVE NEGATIVE mg/dL   Protein, ur NEGATIVE NEGATIVE mg/dL   Nitrite NEGATIVE NEGATIVE   Leukocytes, UA NEGATIVE NEGATIVE   RBC / HPF  0-5 0 - 5 RBC/hpf   WBC, UA 0-5 0 - 5 WBC/hpf   Bacteria, UA NONE SEEN NONE SEEN   Squamous Epithelial / LPF 0-5 0 - 5   Mucus PRESENT    Hyaline Casts, UA PRESENT    Non Squamous Epithelial PRESENT (A) NONE SEEN    Comment: Performed at [facility], [address] [place], [place] [address]  Influenza panel by PCR (type A & B)     Status: None   Collection Time: 07/06/18  9:06 AM  Result Value Ref Range   Influenza A By PCR NEGATIVE NEGATIVE   Influenza B By PCR NEGATIVE NEGATIVE    Comment: (NOTE) The Xpert Xpress Flu assay is intended as an aid in the diagnosis of  influenza and should not be used as a sole basis for treatment.  This  assay is FDA approved for nasopharyngeal swab specimens only. Nasal  washings and aspirates are unacceptable for Xpert Xpress Flu testing. Performed at [facility], [place], [place] [address]   CG4 I-STAT (Lactic acid)     Status: None   Collection Time: 07/06/18 11:08 AM  Result Value Ref Range   Lactic Acid, Venous 0.91 0.5 - 1.9 mmol/L   Dg Chest 2 View  Result Date: 07/06/2018 CLINICAL DATA:  Fever. Currently being treated for ovarian cancer. EXAM: CHEST - 2 VIEW COMPARISON:  Chest CT dated 03/28/2018 and portable chest dated 12/10/2017. FINDINGS: Stable borderline enlarged cardiac silhouette. The lungs remain mildly hyperexpanded with mild peribronchial thickening. Stable left subclavian porta catheter. Mild thoracic spine degenerative changes. Cholecystectomy clips. IMPRESSION: No acute abnormality. Stable mild changes of COPD and chronic bronchitis. Electronically Signed   By: Claudie Revering M.D.   On: 07/06/2018 10:47    Assessment:  The patient is a 77 y.o. woman with recurrent high grade serous ovarian cancer currently day 12 s/p cycle #4 carboplatin and gemcitabine who was admitted through the ER with fever.  Exam reveals no obvious source.  CXR today reveals no acute abnormality.  Influenza A and B negative.  Urinalysis is negative.  She has a history of left lower jaw dental infection s/p antibiotics.  Site appears normal.  She has rectal discomfort which may be related to progressive ovarian cancer.  Symptomatically, she notes fatigue and rectal discomfort only.  She has issues with chronic constipation.  Exam is unremarkable. Rectal exam in ER was negative.  Plan:   1.  Oncology:  Day 12 s/p cycle #4 carboplatin and gemcitabine with Udencya support.  CA125 increasing.  Patient scheduled for f/u scans on 07/08/2018. 2.  Hematology:  WBC good with Udencya support.  Thrombocytopenia secondary to chemotherapy.  Patient started on Lovenox for DVT prophylaxis.  Follow CBC with diff daily. 3.  Infectious disease:  No obvious source of fever.  CXR and UA were negative.  Blood cultures pending.  Patient on broad  spectrum antibiotics (Cefepime and vancomycin).  Consider performing restaging scans tomorrow in hospital to r/o rectal abscess (currently scheduled for 07/08/2018).  Thank you for allowing me to participate in SHARY LAMOS 's care.  I will follow her closely with you until Dr. Aletha Halim return on 07/07/2018.   Lequita Asal, MD  07/06/2018, 1:13 PM

## 2018-07-06 NOTE — Progress Notes (Signed)
CODE SEPSIS - PHARMACY COMMUNICATION  **Broad Spectrum Antibiotics should be administered within 1 hour of Sepsis diagnosis**  Time Code Sepsis Called/Page Received: 08:57  Antibiotics Ordered: Vancomycin, Cefepime, Metronidazole  Time of 1st antibiotic administration: 09:31  Additional action taken by pharmacy:   If necessary, Name of Provider/Nurse Contacted:     Olivia Canter [facility] Clinical Pharmacist  07/06/2018  9:37 AM

## 2018-07-06 NOTE — H&P (Signed)
[facility] NAME: Jamie Conner    MR#:  [id number]  DATE OF BIRTH:  [birth year]  DATE OF ADMISSION:  07/06/2018  PRIMARY CARE PHYSICIAN: Jerrol Banana., MD   REQUESTING/REFERRING PHYSICIAN: Dr. [name]  CHIEF COMPLAINT:   Chief Complaint  Patient presents with  . Fever    HISTORY OF PRESENT ILLNESS:  Jamie Conner  is a 77 y.o. female with a known history of ovarian cancer currently undergoing treatment, hypothyroidism, hyperlipidemia, atrial fibrillation, GERD who presents to the hospital due to fever and chills.  Patient is currently undergoing treatment for ovarian cancer and last chemo was about a week ago.  Over the past few days patient has had a high fevers at home as high as 101.  She called her oncologist and they advised her to come to the ER.  In the emergency room patient had a urinalysis and a chest x-ray which is essentially normal.  But given her persistent fevers at home and suspected bacteremia hospitalist services were contacted for admission.  Patient had a tooth pulled out about 4 weeks ago and prior to the extraction she was put on antibiotics and even put on antibiotics after her extraction for about a week.  She complained of some enlarged lymph node in that left side of her jaw about 2 weeks ago and was put back on some antibiotics and since then the lymph node swelling has subsided.  Given her persistent fevers and suspected bacteremia she is being admitted to the hospital for IV antibiotics.  PAST MEDICAL HISTORY:   Past Medical History:  Diagnosis Date  . Atrial fibrillation ([place])   . Breast cancer ([place]) 1998  . Cancer of breast ([place]) 1998   Left/radiation  . CINV (chemotherapy-induced nausea and vomiting)   . GERD (gastroesophageal reflux disease)   . Hyperlipidemia   . Hypothyroidism   . Mitral insufficiency   . Ovarian cancer ([place])    s/p BSO optimal tumor debulking May  2016/chemo  . Personal history of chemotherapy since 2016   ovarian cancer  . Personal history of radiation therapy 1998  . PVC (premature ventricular contraction)     PAST SURGICAL HISTORY:   Past Surgical History:  Procedure Laterality Date  . ABDOMINAL HYSTERECTOMY    . BILATERAL SALPINGOOPHORECTOMY  May 2016   with optimal tumor debulking   . BREAST LUMPECTOMY Left 1998  . BREAST SURGERY     Left  . CESAREAN SECTION     x2  . CHOLECYSTECTOMY    . COLONOSCOPY  2006  . DEBULKING N/A 12/22/2014   Procedure: DEBULKING;  Surgeon: Gillis Ends, MD;  Location: [facility];  Service: Gynecology;  Laterality: N/A;  . LAPAROTOMY N/A 12/22/2014   Procedure: EXPLORATORY LAPAROTOMY;  Surgeon: Robert Bellow, MD;  Location: [facility];  Service: General;  Laterality: N/A;  . LAPAROTOMY WITH STAGING N/A 12/22/2014   Procedure: LAPAROTOMY WITH STAGING;  Surgeon: Gillis Ends, MD;  Location: [facility];  Service: Gynecology;  Laterality: N/A;  . LEFT HEART CATH AND CORONARY ANGIOGRAPHY N/A 12/10/2017   Procedure: LEFT HEART CATH AND CORONARY ANGIOGRAPHY;  Surgeon: Yolonda Kida, MD;  Location: [facility];  Service: Cardiovascular;  Laterality: N/A;  . PERIPHERAL VASCULAR CATHETERIZATION Left 12/22/2014   Procedure: PORTA CATH INSERTION;  Surgeon: Robert Bellow, MD;  Location: [facility];  Service: General;  Laterality: Left;  . PORTACATH PLACEMENT Right 12/22/2014  Procedure: INSERTION PORT-A-CATH;  Surgeon: Robert Bellow, MD;  Location: [facility];  Service: General;  Laterality: Right;  . SALPINGOOPHORECTOMY Bilateral 12/22/2014   Procedure: SALPINGO OOPHORECTOMY;  Surgeon: Gillis Ends, MD;  Location: [facility];  Service: Gynecology;  Laterality: Bilateral;  . UPPER GI ENDOSCOPY  09/25/04   hiatus hernia, schatzki ring and a single gastric polyp    SOCIAL HISTORY:   Social History   Tobacco Use  . Smoking status: Never Smoker  . Smokeless tobacco:  Never Used  Substance Use Topics  . Alcohol use: No    FAMILY HISTORY:   Family History  Problem Relation Age of Onset  . Cancer Mother        breast, throat, and stomach  . Breast cancer Mother   . Heart disease Father   . Pulmonary embolism Sister   . Cancer Brother 60       angiosarcoma of the chest; carcinoid small intestinal tumor  . Hypertension Brother   . Stroke Brother   . Cancer Maternal Aunt        breast cancer  . Cancer Maternal Grandfather 71       pancreatic    DRUG ALLERGIES:   Allergies  Allergen Reactions  . Carafate [Sucralfate] Rash  . Sulfa Antibiotics Rash    REVIEW OF SYSTEMS:   Review of Systems  Constitutional: Positive for chills, fever and malaise/fatigue. Negative for weight loss.  HENT: Negative for congestion, nosebleeds and tinnitus.   Eyes: Negative for blurred vision, double vision and redness.  Respiratory: Negative for cough, hemoptysis and shortness of breath.   Cardiovascular: Negative for chest pain, orthopnea, leg swelling and PND.  Gastrointestinal: Negative for abdominal pain, diarrhea, melena, nausea and vomiting.  Genitourinary: Negative for dysuria, hematuria and urgency.  Musculoskeletal: Negative for falls and joint pain.  Neurological: Negative for dizziness, tingling, sensory change, focal weakness, seizures, weakness and headaches.  Endo/Heme/Allergies: Negative for polydipsia. Does not bruise/bleed easily.  Psychiatric/Behavioral: Negative for depression and memory loss. The patient is not nervous/anxious.     MEDICATIONS AT HOME:   Prior to Admission medications   Medication Sig Start Date End Date Taking? Authorizing Provider  acetaminophen (TYLENOL) 500 MG tablet Take 500 mg by mouth every 6 (six) hours as needed.   Yes [provider]  alum & mag hydroxide-simeth (MAALOX/MYLANTA) 200-200-20 MG/5ML suspension Take 15 mLs by mouth every 6 (six) hours as needed for indigestion or heartburn. 12/12/17  Yes  Gouru, Illene Silver, MD  aspirin EC 325 MG tablet Take 1 tablet (325 mg total) by mouth daily. 12/12/17 12/12/18 Yes Gouru, Illene Silver, MD  chlorhexidine (PERIDEX) 0.12 % solution  05/20/18  Yes [provider]  Cholecalciferol (VITAMIN D) 2000 units tablet Take 1 tablet (2,000 Units total) by mouth daily. 07/09/17  Yes Jerrol Banana., MD  Docosanol [facility] EX) Apply 1 application topically as needed. Fever blisters   Yes [provider]  fluticasone (FLONASE) 50 MCG/ACT nasal spray Place 1 spray into both nostrils daily as needed.    Yes [provider]  lactulose (CHRONULAC) 10 GM/15ML solution Take 15 mLs (10 g total) by mouth daily as needed for moderate constipation. 03/25/18  Yes Verlon Au, NP  levothyroxine (SYNTHROID, LEVOTHROID) 50 MCG tablet TAKE ONE TABLET ON AN EMPTY STOMACH WITHA GLASS OF WATER AT LEAST 30 TO 60 MINUTES BEFORE BREAKFAST 02/25/18  Yes Jerrol Banana., MD  lidocaine-prilocaine (EMLA) cream Apply to port area 30-45 mins prior to  chemo. 06/24/17  Yes Cammie Sickle, MD  loratadine (CLARITIN) 10 MG tablet Take 10 mg by mouth daily as needed.    Yes [provider]  LORazepam (ATIVAN) 0.5 MG tablet TAKE ONE TABLET BY MOUTH AT BEDTIME AS NEEDED FOR ANXIETY/SLEEP 06/16/18  Yes Cammie Sickle, MD  Lysine 500 MG TABS Take 1 tablet by mouth daily as needed.    Yes [provider]  metoprolol succinate (TOPROL-XL) 25 MG 24 hr tablet Take 1 tablet by mouth daily. 12/24/17 12/24/18 Yes [provider]  Multiple Vitamin (MULTIVITAMIN) capsule Take 1 capsule by mouth daily.   Yes [provider]  ondansetron (ZOFRAN) 4 MG tablet One pill 30-60 mins prior to taking zejula to prevent nausea/vomitting. 11/27/17  Yes Cammie Sickle, MD  ondansetron (ZOFRAN) 8 MG tablet Take 1 tablet (8 mg total) by mouth every 8 (eight) hours as needed for nausea or vomiting. 04/02/18  Yes Cammie Sickle, MD  pantoprazole  (PROTONIX) 40 MG tablet TAKE 1 TABLET BY MOUTH DAILY 05/19/18  Yes Jerrol Banana., MD  polyethylene glycol [facility] / Floria Raveling) packet Take 17 g by mouth daily as needed.   Yes [provider]  prochlorperazine (COMPAZINE) 10 MG tablet Take 1 tablet (10 mg total) by mouth every 6 (six) hours as needed for nausea or vomiting. 04/02/18  Yes Cammie Sickle, MD  furosemide (LASIX) 20 MG tablet Take 0.5 tablets (10 mg total) by mouth daily. Patient not taking: Reported on 06/04/2018 12/13/17   Verlon Au, NP      VITAL SIGNS:  Blood pressure 123/60, pulse 78, temperature 99.4 F (37.4 C), temperature source Oral, resp. rate 15, height 5\' 4"  (1.626 m), weight 85.3 kg, SpO2 94 %.  PHYSICAL EXAMINATION:  Physical Exam  GENERAL:  77 y.o.-year-old patient lying in the bed in no acute distress.  EYES: Pupils equal, round, reactive to light and accommodation. No scleral icterus. Extraocular muscles intact.  HEENT: Head atraumatic, normocephalic. Oropharynx and nasopharynx clear. No oropharyngeal erythema, moist oral mucosa  NECK:  Supple, no jugular venous distention. No thyroid enlargement, no tenderness.  LUNGS: Normal breath sounds bilaterally, no wheezing, rales, rhonchi. No use of accessory muscles of respiration.  CARDIOVASCULAR: S1, S2 RRR. No murmurs, rubs, gallops, clicks.  ABDOMEN: Soft, nontender, nondistended. Bowel sounds present. No organomegaly or mass.  EXTREMITIES: No pedal edema, cyanosis, or clubbing. + 2 pedal & radial pulses b/l.   NEUROLOGIC: Cranial nerves II through XII are intact. No focal Motor or sensory deficits appreciated b/l PSYCHIATRIC: The patient is alert and oriented x 3.  SKIN: No obvious rash, lesion, or ulcer.   LABORATORY PANEL:   CBC Recent Labs  Lab 07/06/18 0906  WBC 10.9*  HGB 9.2*  HCT 28.9*  PLT 78*    ------------------------------------------------------------------------------------------------------------------  Chemistries  Recent Labs  Lab 07/06/18 0906  NA 138  K 3.5  CL 105  CO2 24  GLUCOSE 114*  BUN 11  CREATININE 0.94  CALCIUM 9.1  AST 28  ALT 18  ALKPHOS 119  BILITOT 0.6   ------------------------------------------------------------------------------------------------------------------  Cardiac Enzymes No results for input(s): TROPONINI in the last 168 hours. ------------------------------------------------------------------------------------------------------------------  RADIOLOGY:  Dg Chest 2 View  Result Date: 07/06/2018 CLINICAL DATA:  Fever. Currently being treated for ovarian cancer. EXAM: CHEST - 2 VIEW COMPARISON:  Chest CT dated 03/28/2018 and portable chest dated 12/10/2017. FINDINGS: Stable borderline enlarged cardiac silhouette. The lungs remain mildly hyperexpanded with mild peribronchial thickening. Stable left  subclavian porta catheter. Mild thoracic spine degenerative changes. Cholecystectomy clips. IMPRESSION: No acute abnormality. Stable mild changes of COPD and chronic bronchitis. Electronically Signed   By: Claudie Revering M.D.   On: 07/06/2018 10:47     IMPRESSION AND PLAN:   77 year old female with past medical history of ovarian cancer currently undergoing treatment, hypertension, hypothyroidism, anxiety, GERD who presents to the hospital due to fever, malaise.  1.  Fever of unknown origin- patient presents to the hospital due to fevers of 101 at home.  The source remains unclear presently.  Patient has no upper respiratory symptoms, her chest x-ray is negative, urinalysis is negative.  - she had a recent dental infection about 4 weeks ago and therefore there is a suspicion that she could have transient bacteremia. - We will treat her with broad-spectrum IV antibiotics with vancomycin, Zosyn, await blood cultures, follow fever curve.  If  needed we will consult infectious disease.  2.  History of ovarian cancer currently undergoing treatment-patient is not neutropenic.  She is mildly anemic and thrombocytopenic secondary to chemotherapy.  I will consult oncology.  Patient follows with Dr. Rogue Bussing.  3.  Essential hypertension-continue Toprol.  4.  Hypothyroidism-continue Synthroid.  5.  Anxiety-continue Ativan as needed.      All the records are reviewed and case discussed with ED provider. Management plans discussed with the patient, family and they are in agreement.  CODE STATUS: Full code  TOTAL TIME TAKING CARE OF THIS PATIENT: 45 minutes.    Henreitta Leber M.D on 07/06/2018 at 11:02 AM  Between 7am to 6pm - Pager - [phone]  After 6pm go to [email or web address] - password [email or web address] [facility]  [place] [facility]  Office  [phone]  CC: Primary care physician; Jerrol Banana., MD

## 2018-07-06 NOTE — ED Notes (Signed)
admiting MD states pt is ok to eat and drink at this time. Pt given ice water by RN

## 2018-07-06 NOTE — ED Notes (Signed)
I stat lactic 0.91

## 2018-07-06 NOTE — Progress Notes (Addendum)
Pharmacy Antibiotic Note  Jamie Conner is a 77 y.o. female admitted on 07/06/2018 with sepsis.  Pharmacy initially consulted for vancomycin and cefepime dosing with patient also receiving metronidazole. Per admitting MD Dr. Verdell Carmine, will switch cefepime to Zosyn and discontinue metronidazole. Suspect bacteremia.  Plan: Vancomycin 1 g given in the ED. Will start vancomycin 750 mg IV q12h to start today at 1600. Goal trough 15-20 mcg/ml. Trough ordered 11/19 at 0300. Kinetics: ke 0.05, t1/2 13.86, VD 46.8  Start Zosyn 3.375 g IV q8h extended infusion to start tonight at 2200 (pt received cefepime/metronidazole this am at ~ 1000)  Height: 5\' 4"  (162.6 cm) Weight: 188 lb (85.3 kg) IBW/kg (Calculated) : 54.7  Temp (24hrs), Avg:99.4 F (37.4 C), Min:99.4 F (37.4 C), Max:99.4 F (37.4 C)  Recent Labs  Lab 07/06/18 0905 07/06/18 0906  WBC  --  10.9*  CREATININE  --  0.94  LATICACIDVEN 1.83  --     Estimated Creatinine Clearance: 54.6 mL/min (by C-G formula based on SCr of 0.94 mg/dL).    Allergies  Allergen Reactions  . Carafate [Sucralfate] Rash  . Sulfa Antibiotics Rash    Antimicrobials this admission: Cefepime 11/17 x 1 dose Metronidazole 11/17 x 1 dose Vancomycin 11/17 >> Zosyn 11/17 >>  Dose adjustments this admission: NA  Microbiology results: 11/17 BCx: pending  Thank you for allowing pharmacy to be a part of this patient's care.  Tawnya Crook, PharmD Pharmacy Resident  07/06/2018 10:19 AM

## 2018-07-06 NOTE — ED Provider Notes (Addendum)
[facility] Emergency Department Provider Note  ___________________________________________   First MD Initiated Contact with Patient 07/06/18 [phone]     (approximate)  I have reviewed the triage vital signs and the nursing notes.   HISTORY  Chief Complaint Fever   HPI Jamie Conner is a 77 y.o. female with a history of atrial fibrillation as well as ovarian cancer on chemotherapy, last treatment 1.5 weeks ago, who is presenting emergency department with a fever.  Says that her temperature was one 1.4 but last night and this morning.  Last Tylenol was 500 mg at 3 AM.  Patient says that she has had a mild, dull pain to her lower abdomen as well as her rectum which she has had previously.  Denies any pain otherwise.  Denies any difficulty with urination.  Says that she has chronic constipation but this is baseline and she has had bowel movements regularly with the use of MiraLAX, daily.  Recent dental infection which seems to have resolved spontaneously.  However, patient took 1 dose of clindamycin yesterday afternoon.  Called cancer center this morning was sent to the emergency department for further evaluation and treatment.  Concern for neutropenic fever.    Regarding the dental infection the patient had a left-sided mandibular molar pulled about a month ago.  Says that she took a course of antibiotics initially.  However, she had swelling to the left submandibular tissues earlier this week and was prescribed clindamycin but did not take it except for 1 dose yesterday afternoon.  However, the swelling is gone down.  Does not report pain at this time.   Past Medical History:  Diagnosis Date  . Atrial fibrillation ([place])   . Breast cancer ([place]) 1998  . Cancer of breast ([place]) 1998   Left/radiation  . CINV (chemotherapy-induced nausea and vomiting)   . GERD (gastroesophageal reflux disease)   . Hyperlipidemia   . Hypothyroidism   . Mitral insufficiency   .  Ovarian cancer ([place])    s/p BSO optimal tumor debulking May 2016/chemo  . Personal history of chemotherapy since 2016   ovarian cancer  . Personal history of radiation therapy 1998  . PVC (premature ventricular contraction)     Patient Active Problem List   Diagnosis Date Noted  . Chest pain 12/10/2017  . STEMI (ST elevation myocardial infarction) ([place]) 12/10/2017  . Ovarian cancer, unspecified laterality ([place]) 09/11/2017  . Counseling regarding goals of care 11/26/2016  . Encounter for monitoring cardiotoxic drug therapy 02/02/2016  . Breast cancer in female [facility]) 12/13/2015  . Peptic ulcer disease 12/12/2015  . DDD (degenerative disc disease), lumbosacral 12/12/2015  . Hyperlipidemia 12/12/2015  . Acute anxiety 12/12/2015  . Insomnia 12/12/2015  . Allergic rhinitis 12/12/2015  . GERD (gastroesophageal reflux disease) 12/12/2015  . Internal hemorrhoid 12/12/2015  . OA (osteoarthritis) 12/12/2015  . Osteopenia 12/12/2015  . Hypothyroid 04/11/2015  . Benign essential HTN 02/01/2015  . Retroperitoneal fibrosis   . Combined fat and carbohydrate induced hyperlipemia 01/04/2015  . Awareness of heartbeats 01/04/2015  . Beat, premature ventricular 01/04/2015  . Malignant neoplasm of ovary ([place]) 12/16/2014  . MI (mitral incompetence) 09/02/2014  . AF (paroxysmal atrial fibrillation) ([place]) 09/02/2014    Past Surgical History:  Procedure Laterality Date  . ABDOMINAL HYSTERECTOMY    . BILATERAL SALPINGOOPHORECTOMY  May 2016   with optimal tumor debulking   . BREAST LUMPECTOMY Left 1998  . BREAST SURGERY     Left  . CESAREAN SECTION  x2  . CHOLECYSTECTOMY    . COLONOSCOPY  2006  . DEBULKING N/A 12/22/2014   Procedure: DEBULKING;  Surgeon: Gillis Ends, MD;  Location: [facility] ORS;  Service: Gynecology;  Laterality: N/A;  . LAPAROTOMY N/A 12/22/2014   Procedure: EXPLORATORY LAPAROTOMY;  Surgeon: Robert Bellow, MD;  Location: [facility] ORS;  Service: General;  Laterality:  N/A;  . LAPAROTOMY WITH STAGING N/A 12/22/2014   Procedure: LAPAROTOMY WITH STAGING;  Surgeon: Gillis Ends, MD;  Location: [facility] ORS;  Service: Gynecology;  Laterality: N/A;  . LEFT HEART CATH AND CORONARY ANGIOGRAPHY N/A 12/10/2017   Procedure: LEFT HEART CATH AND CORONARY ANGIOGRAPHY;  Surgeon: Yolonda Kida, MD;  Location: [place] CV [facility];  Service: Cardiovascular;  Laterality: N/A;  . PERIPHERAL VASCULAR CATHETERIZATION Left 12/22/2014   Procedure: PORTA CATH INSERTION;  Surgeon: Robert Bellow, MD;  Location: [facility] ORS;  Service: General;  Laterality: Left;  . PORTACATH PLACEMENT Right 12/22/2014   Procedure: INSERTION PORT-A-CATH;  Surgeon: Robert Bellow, MD;  Location: [facility] ORS;  Service: General;  Laterality: Right;  . SALPINGOOPHORECTOMY Bilateral 12/22/2014   Procedure: SALPINGO OOPHORECTOMY;  Surgeon: Gillis Ends, MD;  Location: [facility] ORS;  Service: Gynecology;  Laterality: Bilateral;  . UPPER GI ENDOSCOPY  09/25/04   hiatus hernia, schatzki ring and a single gastric polyp    Prior to Admission medications   Medication Sig Start Date End Date Taking? Authorizing Provider  acetaminophen (TYLENOL) 500 MG tablet Take 500 mg by mouth every 6 (six) hours as needed.   Yes [provider]  alum & mag hydroxide-simeth (MAALOX/MYLANTA) 200-200-20 MG/5ML suspension Take 15 mLs by mouth every 6 (six) hours as needed for indigestion or heartburn. 12/12/17  Yes Gouru, Illene Silver, MD  aspirin EC 325 MG tablet Take 1 tablet (325 mg total) by mouth daily. 12/12/17 12/12/18 Yes Gouru, Illene Silver, MD  chlorhexidine (PERIDEX) 0.12 % solution  05/20/18  Yes [provider]  Cholecalciferol (VITAMIN D) 2000 units tablet Take 1 tablet (2,000 Units total) by mouth daily. 07/09/17  Yes Jerrol Banana., MD  Docosanol [facility] EX) Apply 1 application topically as needed. Fever blisters   Yes [provider]  fluticasone (FLONASE) 50 MCG/ACT nasal spray Place 1  spray into both nostrils daily as needed.    Yes [provider]  lactulose (CHRONULAC) 10 GM/15ML solution Take 15 mLs (10 g total) by mouth daily as needed for moderate constipation. 03/25/18  Yes Verlon Au, NP  levothyroxine (SYNTHROID, LEVOTHROID) 50 MCG tablet TAKE ONE TABLET ON AN EMPTY STOMACH WITHA GLASS OF WATER AT LEAST 30 TO 60 MINUTES BEFORE BREAKFAST 02/25/18  Yes Jerrol Banana., MD  lidocaine-prilocaine (EMLA) cream Apply to port area 30-45 mins prior to chemo. 06/24/17  Yes Cammie Sickle, MD  loratadine (CLARITIN) 10 MG tablet Take 10 mg by mouth daily as needed.    Yes [provider]  LORazepam (ATIVAN) 0.5 MG tablet TAKE ONE TABLET BY MOUTH AT BEDTIME AS NEEDED FOR ANXIETY/SLEEP 06/16/18  Yes Cammie Sickle, MD  Lysine 500 MG TABS Take 1 tablet by mouth daily as needed.    Yes [provider]  metoprolol succinate (TOPROL-XL) 25 MG 24 hr tablet Take 1 tablet by mouth daily. 12/24/17 12/24/18 Yes [provider]  Multiple Vitamin (MULTIVITAMIN) capsule Take 1 capsule by mouth daily.   Yes [provider]  ondansetron (ZOFRAN) 4 MG tablet One pill 30-60 mins prior to taking zejula to prevent  nausea/vomitting. 11/27/17  Yes Cammie Sickle, MD  ondansetron (ZOFRAN) 8 MG tablet Take 1 tablet (8 mg total) by mouth every 8 (eight) hours as needed for nausea or vomiting. 04/02/18  Yes Cammie Sickle, MD  pantoprazole (PROTONIX) 40 MG tablet TAKE 1 TABLET BY MOUTH DAILY 05/19/18  Yes Jerrol Banana., MD  polyethylene glycol [facility] Floria Raveling) packet Take 17 g by mouth daily as needed.   Yes [provider]  prochlorperazine (COMPAZINE) 10 MG tablet Take 1 tablet (10 mg total) by mouth every 6 (six) hours as needed for nausea or vomiting. 04/02/18  Yes Cammie Sickle, MD  furosemide (LASIX) 20 MG tablet Take 0.5 tablets (10 mg total) by mouth daily. Patient not taking: Reported on 06/04/2018  12/13/17   Verlon Au, NP    Allergies Carafate [sucralfate] and Sulfa antibiotics  Family History  Problem Relation Age of Onset  . Cancer Mother        breast, throat, and stomach  . Breast cancer Mother   . Heart disease Father   . Pulmonary embolism Sister   . Cancer Brother 60       angiosarcoma of the chest; carcinoid small intestinal tumor  . Hypertension Brother   . Stroke Brother   . Cancer Maternal Aunt        breast cancer  . Cancer Maternal Grandfather 17       pancreatic    Social History Social History   Tobacco Use  . Smoking status: Never Smoker  . Smokeless tobacco: Never Used  Substance Use Topics  . Alcohol use: No  . Drug use: No    Review of Systems  Constitutional: As above Eyes: No visual changes. ENT: No sore throat. Cardiovascular: Denies chest pain. Respiratory: Denies shortness of breath. Gastrointestinal:  No nausea, no vomiting.  No diarrhea. Genitourinary: Negative for dysuria. Musculoskeletal: Negative for back pain. Skin: Negative for rash. Neurological: Negative for headaches, focal weakness or numbness.   ____________________________________________   PHYSICAL EXAM:  VITAL SIGNS: ED Triage Vitals  Enc Vitals Group     BP 07/06/18 0832 108/65     Pulse Rate 07/06/18 0832 (!) 111     Resp 07/06/18 0832 18     Temp 07/06/18 0832 99.4 F (37.4 C)     Temp Source 07/06/18 0832 Oral     SpO2 07/06/18 0832 98 %     Weight 07/06/18 0833 188 lb (85.3 kg)     Height 07/06/18 0833 5\' 4"  (1.626 m)     Head Circumference --      Peak Flow --      Pain Score 07/06/18 0833 4     Pain Loc --      Pain Edu? --      Excl. in [place]? --     Constitutional: Alert and oriented. Well appearing and in no acute distress. Eyes: Conjunctivae are normal.  Head: Atraumatic. Nose: No congestion/rhinnorhea. Mouth/Throat: Mucous membranes are moist.  Left mandibular molar socket where tooth was pulled does not have any exudate, chronic  swelling or erythema.  There is no tenderness to the submandibular tissue nor is there any palpable node, induration or erythema.  Patient without any respiratory distress.  Controlling secretions. Neck: No stridor.   Cardiovascular: Normal rate, regular rhythm. Grossly normal heart sounds.  Port in place to the left side of the chest wall without any induration or erythema overlying.  No tenderness to palpation. Respiratory: Normal respiratory  effort.  No retractions. Lungs CTAB. Gastrointestinal: Soft and nontender. No distention.  No external lesions on the rectal exam.  No hemorrhoids, fissures, induration or swelling.  No masses visualized. Musculoskeletal: No lower extremity tenderness nor edema.  No joint effusions. Neurologic:  Normal speech and language. No gross focal neurologic deficits are appreciated. Skin:  Skin is warm, dry and intact. No rash noted. Psychiatric: Mood and affect are normal. Speech and behavior are normal.  ____________________________________________   LABS (all labs ordered are listed, but only abnormal results are displayed)  Labs Reviewed  COMPREHENSIVE METABOLIC PANEL - Abnormal; Notable for the following components:      Result Value   Glucose, Bld 114 (*)    GFR calc non Af Amer 58 (*)    All other components within normal limits  CBC WITH DIFFERENTIAL/PLATELET - Abnormal; Notable for the following components:   WBC 10.9 (*)    RBC 2.90 (*)    Hemoglobin 9.2 (*)    HCT 28.9 (*)    RDW 18.5 (*)    Platelets 78 (*)    Monocytes Absolute 1.4 (*)    Abs Immature Granulocytes 0.12 (*)    All other components within normal limits  URINALYSIS, COMPLETE (UACMP) WITH MICROSCOPIC - Abnormal; Notable for the following components:   Color, Urine YELLOW (*)    APPearance CLEAR (*)    Non Squamous Epithelial PRESENT (*)    All other components within normal limits  CULTURE, BLOOD (ROUTINE X 2)  CULTURE, BLOOD (ROUTINE X 2)  CULTURE, BLOOD (ROUTINE X 2)    CULTURE, BLOOD (ROUTINE X 2)  PROTIME-INR  INFLUENZA PANEL BY PCR (TYPE A & B)  I-STAT CG4 LACTIC ACID, ED  CG4 I-STAT (LACTIC ACID)  I-STAT CG4 LACTIC ACID, ED   ____________________________________________  EKG  ED ECG REPORT I, Doran Stabler, the attending physician, personally viewed and interpreted this ECG.   Date: 07/06/2018  EKG Time: 0914  Rate: 89  Rhythm: normal sinus rhythm  Axis: Normal  Intervals:none  ST&T Change: No ST segment elevation or depression.  No abnormal T wave inversion.  Exam is likely confounded by baseline wander.  ____________________________________________  RADIOLOGY  Pending chest x-ray.  Patient initially refused. ____________________________________________   PROCEDURES  Procedure(s) performed:   .Critical Care Performed by: Orbie Pyo, MD Authorized by: Orbie Pyo, MD      Critical Care performed:   ____________________________________________   INITIAL IMPRESSION / [place] / ED COURSE  Pertinent labs & imaging results that were available during my care of the patient were reviewed by me and considered in my medical decision making (see chart for details).  DDX: Bacteremia, fever, sepsis, neutropenic fever, UTI, pneumonia, influenza As part of my medical decision making, I reviewed the following data within the electronic MEDICAL RECORD NUMBER Notes from outpatient visits  ----------------------------------------- 9:23 AM on 07/06/2018 -----------------------------------------  Discussed case with Dr. Mike Gip from the cancer center who agrees with sepsis work-up as well as empiric antibiotics.  Likely admit for observation given to fevers and incomplete treatment of dental abscess.  ----------------------------------------- 10:21 AM on 07/06/2018 -----------------------------------------  Patient to be treated with empiric antibiotics.  Concern for bacteremia secondary to  dental infection.  Patient understanding the diagnosis as well as treatment and willing to comply.  Signed out to Dr. Verdell Carmine. ____________________________________________   FINAL CLINICAL IMPRESSION(S) / ED DIAGNOSES  Fever.    NEW MEDICATIONS STARTED DURING THIS VISIT:  New Prescriptions   No  medications on file     Note:  This document was prepared using Dragon voice recognition software and may include unintentional dictation errors.     Orbie Pyo, MD 07/06/18 [place], Randall An, MD 07/06/18 [place], Randall An, MD 07/06/18 1025

## 2018-07-06 NOTE — ED Notes (Signed)
I stat lactic 1.83

## 2018-07-07 ENCOUNTER — Other Ambulatory Visit: Payer: Self-pay | Admitting: Internal Medicine

## 2018-07-07 DIAGNOSIS — D696 Thrombocytopenia, unspecified: Secondary | ICD-10-CM

## 2018-07-07 DIAGNOSIS — C569 Malignant neoplasm of unspecified ovary: Secondary | ICD-10-CM

## 2018-07-07 LAB — BASIC METABOLIC PANEL
ANION GAP: 4 — AB (ref 5–15)
BUN: 14 mg/dL (ref 8–23)
CALCIUM: 8.7 mg/dL — AB (ref 8.9–10.3)
CO2: 27 mmol/L (ref 22–32)
Chloride: 106 mmol/L (ref 98–111)
Creatinine, Ser: 0.93 mg/dL (ref 0.44–1.00)
GFR calc non Af Amer: 59 mL/min — ABNORMAL LOW (ref >60)
Glucose, Bld: 111 mg/dL — ABNORMAL HIGH (ref 70–99)
POTASSIUM: 3.5 mmol/L (ref 3.5–5.1)
Sodium: 137 mmol/L (ref 135–145)

## 2018-07-07 LAB — CBC
HCT: 25.8 % — ABNORMAL LOW (ref 36.0–46.0)
Hemoglobin: 8.3 g/dL — ABNORMAL LOW (ref 12.0–15.0)
MCH: 32 pg (ref 26.0–34.0)
MCHC: 32.2 g/dL (ref 30.0–36.0)
MCV: 99.6 fL (ref 80.0–100.0)
Platelets: 62 10*3/uL — ABNORMAL LOW (ref 150–400)
RBC: 2.59 MIL/uL — ABNORMAL LOW (ref 3.87–5.11)
RDW: 18.7 % — ABNORMAL HIGH (ref 11.5–15.5)
WBC: 8.1 10*3/uL (ref 4.0–10.5)
nRBC: 0 % (ref 0.0–0.2)

## 2018-07-07 MED ORDER — AMOXICILLIN-POT CLAVULANATE 875-125 MG PO TABS
1.0000 | ORAL_TABLET | Freq: Two times a day (BID) | ORAL | Status: DC
  Administered 2018-07-07: 1 via ORAL
  Filled 2018-07-07: qty 1

## 2018-07-07 MED ORDER — AMOXICILLIN-POT CLAVULANATE 875-125 MG PO TABS: 1.0000 | ORAL_TABLET | Freq: Two times a day (BID) | ORAL | 0 refills | Status: AC

## 2018-07-07 NOTE — Plan of Care (Signed)
  Problem: Education: Goal: Knowledge of General Education information will improve Description: Including pain rating scale, medication(s)/side effects and non-pharmacologic comfort measures Outcome: Progressing   Problem: Clinical Measurements: Goal: Diagnostic test results will improve Outcome: Progressing Goal: Cardiovascular complication will be avoided Outcome: Progressing   

## 2018-07-07 NOTE — Progress Notes (Signed)
Paged hospitalist to notify of platelet count and determine if lovenox should be administered. MD referred to daytime oncologist note and said ok to administer.

## 2018-07-07 NOTE — Progress Notes (Signed)
Jamie Conner   DOB:1941/01/29   XB#:[id number]    Subjective: Patient denies any fevers overnight.  Highest temperature was 99.  No night sweats.  Fatigue improved.  No nausea vomiting pain no constipation.  Tooth pain resolved.  Objective:  Vitals:   07/07/18 0750 07/07/18 0753  BP: (!) 119/42 (!) 106/56  Pulse: 69   Resp:    Temp: 98.2 F (36.8 C)   SpO2: 99%      Intake/Output Summary (Last 24 hours) at 07/07/2018 1140 Last data filed at 07/07/2018 0931 Gross per 24 hour  Intake 760.66 ml  Output 200 ml  Net 560.66 ml    GENERAL Alert, no distress and comfortable.  Accompanied by husband. EYES: no pallor or icterus OROPHARYNX: no thrush or ulceration. NECK: supple, no masses felt LYMPH:  no palpable lymphadenopathy in the cervical, axillary or inguinal regions LUNGS: decreased breath sounds to auscultation at bases and  No wheeze or crackles HEART/CVS: regular rate & rhythm and no murmurs; No lower extremity edema ABDOMEN: abdomen soft, tender  on deep palpation. and normal bowel sounds Musculoskeletal:no cyanosis of digits and no clubbing  PSYCH: alert & oriented x 3 with fluent speech NEURO: no focal motor/sensory deficits SKIN:  no rashes or significant lesions   Labs:  Lab Results  Component Value Date   WBC 8.1 07/07/2018   HGB 8.3 (L) 07/07/2018   HCT 25.8 (L) 07/07/2018   MCV 99.6 07/07/2018   PLT 62 (L) 07/07/2018   NEUTROABS 7.5 07/06/2018    Lab Results  Component Value Date   NA 137 07/07/2018   K 3.5 07/07/2018   CL 106 07/07/2018   CO2 27 07/07/2018    Studies:  Dg Chest 2 View  Result Date: 07/06/2018 CLINICAL DATA:  Fever. Currently being treated for ovarian cancer. EXAM: CHEST - 2 VIEW COMPARISON:  Chest CT dated 03/28/2018 and portable chest dated 12/10/2017. FINDINGS: Stable borderline enlarged cardiac silhouette. The lungs remain mildly hyperexpanded with mild peribronchial thickening. Stable left subclavian porta catheter. Mild  thoracic spine degenerative changes. Cholecystectomy clips. IMPRESSION: No acute abnormality. Stable mild changes of COPD and chronic bronchitis. Electronically Signed   By: Claudie Revering M.D.   On: 07/06/2018 10:47    Assessment & Plan:   #77 year old female patient with recurrent/high-grade serous ovarian cancer status currently on chemotherapy is currently admitted to hospital for fevers  #Ovarian cancer-high-grade serous recurrent-status post 4 treatments of carboplatin gemcitabine.  CA 25 rising; suspicious for progressive disease.  Patient awaiting CT of the abdomen pelvis tomorrow outpatient.   #Fever-highest 101.4 unclear etiology.  Infectious work-up negative.  Question dental infection versus others-gemcitabine induced fever.  Currently resolved.  Recommend discharge home as clinically stable on amoxicillin for 5 days.  #Thrombocytopenia platelets 62-from recent chemotherapy; monitor for now.  #Disposition: CT scan abdomen pelvis as ordered outpatient tomorrow; patient will keep appointment with me on November 21st/GYN oncology on the 20th as planned.  The above plan was discussed with the patient and husband detail.  They agree.  Cammie Sickle, MD 07/07/2018  11:40 AM

## 2018-07-07 NOTE — Discharge Summary (Signed)
[place] [facility], 77 y.o., DOB Apr 10, 1941, MRN [id number]. Admission date: 07/06/2018 Discharge Date 07/07/2018 Primary MD Jerrol Banana., MD Admitting Physician Henreitta Leber, MD  Admission Diagnosis  Fever, unspecified fever cause [R50.9]  Discharge Diagnosis   Active Problems:   Fever of unknown origin Ovarian cancer Essential hypertension Hypothyroidism Anxiety       Hospital Course  Patient 77 year old female with past medical history of ovarian cancer currently undergoing treatment presented with fever.  Patient had chest x-ray urinalysis and blood cultures which were negative.  Patient was treated with broad-spectrum antibiotics her fevers have resolved she was seen by oncology who recommended discharge with outpatient follow-up.             Consults  hematology/oncology  Significant Tests:  See full reports for all details     Dg Chest 2 View  Result Date: 07/06/2018 CLINICAL DATA:  Fever. Currently being treated for ovarian cancer. EXAM: CHEST - 2 VIEW COMPARISON:  Chest CT dated 03/28/2018 and portable chest dated 12/10/2017. FINDINGS: Stable borderline enlarged cardiac silhouette. The lungs remain mildly hyperexpanded with mild peribronchial thickening. Stable left subclavian porta catheter. Mild thoracic spine degenerative changes. Cholecystectomy clips. IMPRESSION: No acute abnormality. Stable mild changes of COPD and chronic bronchitis. Electronically Signed   By: Claudie Revering M.D.   On: 07/06/2018 10:47       Today   Subjective:   Jamie Conner  Pt denies any complaints  Objective:   Blood pressure (!) 123/58, pulse 73, temperature 98.4 F (36.9 C), temperature source Oral, resp. rate 18, height 5\' 4"  (1.626 m), weight 85.3 kg, SpO2 100 %.  .  Intake/Output Summary (Last 24 hours) at 07/07/2018 1442 Last data filed at 07/07/2018 1344 Gross per 24 hour  Intake 880.66 ml  Output  200 ml  Net 680.66 ml    Exam VITAL SIGNS: Blood pressure (!) 123/58, pulse 73, temperature 98.4 F (36.9 C), temperature source Oral, resp. rate 18, height 5\' 4"  (1.626 m), weight 85.3 kg, SpO2 100 %.  GENERAL:  77 y.o.-year-old patient lying in the bed with no acute distress.  EYES: Pupils equal, round, reactive to light and accommodation. No scleral icterus. Extraocular muscles intact.  HEENT: Head atraumatic, normocephalic. Oropharynx and nasopharynx clear.  NECK:  Supple, no jugular venous distention. No thyroid enlargement, no tenderness.  LUNGS: Normal breath sounds bilaterally, no wheezing, rales,rhonchi or crepitation. No use of accessory muscles of respiration.  CARDIOVASCULAR: S1, S2 normal. No murmurs, rubs, or gallops.  ABDOMEN: Soft, nontender, nondistended. Bowel sounds present. No organomegaly or mass.  EXTREMITIES: No pedal edema, cyanosis, or clubbing.  NEUROLOGIC: Cranial nerves II through XII are intact. Muscle strength 5/5 in all extremities. Sensation intact. Gait not checked.  PSYCHIATRIC: The patient is alert and oriented x 3.  SKIN: No obvious rash, lesion, or ulcer.   Data Review     CBC w Diff:  Lab Results  Component Value Date   WBC 8.1 07/07/2018   HGB 8.3 (L) 07/07/2018   HGB 14.7 12/14/2014   HCT 25.8 (L) 07/07/2018   HCT 45.1 12/14/2014   PLT 62 (L) 07/07/2018   PLT 284 12/14/2014   LYMPHOPCT 16 07/06/2018   LYMPHOPCT 27.8 12/14/2014   BANDSPCT 7 04/04/2015   MONOPCT 13 07/06/2018   MONOPCT 12.2 12/14/2014   EOSPCT 0 07/06/2018   EOSPCT 0.8 12/14/2014   BASOPCT 0 07/06/2018   BASOPCT 0.6 12/14/2014  CMP:  Lab Results  Component Value Date   NA 137 07/07/2018   NA 140 12/14/2014   K 3.5 07/07/2018   K 4.4 12/14/2014   CL 106 07/07/2018   CL 106 12/14/2014   CO2 27 07/07/2018   CO2 26 12/14/2014   BUN 14 07/07/2018   BUN 12 12/14/2014   CREATININE 0.93 07/07/2018   CREATININE 0.97 12/14/2014   GLU 84 03/16/2014   PROT 7.0  07/06/2018   PROT 7.3 12/14/2014   ALBUMIN 3.5 07/06/2018   ALBUMIN 3.8 12/14/2014   BILITOT 0.6 07/06/2018   BILITOT 0.5 12/14/2014   ALKPHOS 119 07/06/2018   ALKPHOS 138 (H) 12/14/2014   AST 28 07/06/2018   AST 20 12/14/2014   ALT 18 07/06/2018   ALT 11 (L) 12/14/2014  .  Micro Results Recent Results (from the past 240 hour(s))  Blood Culture (routine x 2)     Status: None (Preliminary result)   Collection Time: 07/06/18  9:06 AM  Result Value Ref Range Status   Specimen Description BLOOD LT [facility]  Final   Special Requests   Final    BOTTLES DRAWN AEROBIC AND ANAEROBIC Blood Culture results may not be optimal due to an excessive volume of blood received in culture bottles   Culture   Final    NO GROWTH < 24 HOURS Performed at [facility], [address] [place], [place] [address]    Report Status PENDING  Incomplete  Blood Culture (routine x 2)     Status: None (Preliminary result)   Collection Time: 07/06/18  9:06 AM  Result Value Ref Range Status   Specimen Description BLOOD RT FOREARM  Final   Special Requests   Final    BOTTLES DRAWN AEROBIC AND ANAEROBIC Blood Culture results may not be optimal due to an excessive volume of blood received in culture bottles   Culture   Final    NO GROWTH < 24 HOURS Performed at [facility], [address] [place], [place] [address]    Report Status PENDING  Incomplete        Code Status Orders  (From admission, onward)         Start     Ordered   07/06/18 1222  Full code  Continuous     07/06/18 1221        Code Status History    Date Active Date Inactive Code Status Order ID Comments User Context   12/10/2017 1140 12/12/2017 1630 Full Code [id number]  Bettey Costa, MD Inpatient   04/11/2015 0133 04/11/2015 1613 Full Code [id number]  Juluis Mire, MD Inpatient   12/22/2014 1718 12/26/2014 1527 Full Code [id number]  Robert Bellow, MD Inpatient    Advance Directive Documentation     Most Recent  Value  Type of Advance Directive  Healthcare Power of Attorney, Living will  Pre-existing out of facility DNR order (yellow form or pink MOST form)  -  "MOST" Form in Place?  -          Follow-up Information    Cammie Sickle, MD Follow up.   Specialties:  Internal Medicine, Oncology Why:  as scheduled  Contact information: [place]  [id number] [phone]           Discharge Medications   Allergies as of 07/07/2018      Reactions   Carafate [sucralfate] Rash   Sulfa Antibiotics Rash      Medication List  TAKE these medications   ABREVA EX Apply 1 application topically as needed. Fever blisters   acetaminophen 500 MG tablet Commonly known as:  TYLENOL Take 500 mg by mouth every 6 (six) hours as needed.   alum & mag hydroxide-simeth 200-200-20 MG/5ML suspension Commonly known as:  MAALOX/MYLANTA Take 15 mLs by mouth every 6 (six) hours as needed for indigestion or heartburn.   amoxicillin-clavulanate 875-125 MG tablet Commonly known as:  AUGMENTIN Take 1 tablet by mouth every 12 (twelve) hours for 5 days.   aspirin EC 325 MG tablet Take 1 tablet (325 mg total) by mouth daily.   chlorhexidine 0.12 % solution Commonly known as:  PERIDEX   fluticasone 50 MCG/ACT nasal spray Commonly known as:  FLONASE Place 1 spray into both nostrils daily as needed.   furosemide 20 MG tablet Commonly known as:  LASIX Take 0.5 tablets (10 mg total) by mouth daily.   lactulose 10 GM/15ML solution Commonly known as:  CHRONULAC Take 15 mLs (10 g total) by mouth daily as needed for moderate constipation.   levothyroxine 50 MCG tablet Commonly known as:  SYNTHROID, LEVOTHROID TAKE ONE TABLET ON AN EMPTY STOMACH WITHA GLASS OF WATER AT LEAST 30 TO 60 MINUTES BEFORE BREAKFAST   lidocaine-prilocaine cream Commonly known as:  EMLA Apply to port area 30-45 mins prior to chemo.   loratadine 10 MG tablet Commonly known as:  CLARITIN Take  10 mg by mouth daily as needed.   LORazepam 0.5 MG tablet Commonly known as:  ATIVAN TAKE ONE TABLET BY MOUTH AT BEDTIME AS NEEDED FOR ANXIETY/SLEEP   Lysine 500 MG Tabs Take 1 tablet by mouth daily as needed.   metoprolol succinate 25 MG 24 hr tablet Commonly known as:  TOPROL-XL Take 1 tablet by mouth daily.   multivitamin capsule Take 1 capsule by mouth daily.   ondansetron 4 MG tablet Commonly known as:  ZOFRAN One pill 30-60 mins prior to taking zejula to prevent nausea/vomitting.   ondansetron 8 MG tablet Commonly known as:  ZOFRAN Take 1 tablet (8 mg total) by mouth every 8 (eight) hours as needed for nausea or vomiting.   pantoprazole 40 MG tablet Commonly known as:  PROTONIX TAKE 1 TABLET BY MOUTH DAILY   polyethylene glycol packet Commonly known as:  MIRALAX / GLYCOLAX Take 17 g by mouth daily as needed.   prochlorperazine 10 MG tablet Commonly known as:  COMPAZINE Take 1 tablet (10 mg total) by mouth every 6 (six) hours as needed for nausea or vomiting.   Vitamin D 50 MCG (2000 UT) tablet Take 1 tablet (2,000 Units total) by mouth daily.          Total Time in preparing paper work, data evaluation and todays exam - 9 minutes  Dustin Flock M.D on 07/07/2018 at [place]  [phone]

## 2018-07-07 NOTE — Progress Notes (Signed)
Received MD order to discharge patient to home, reviewed homes meds, prescriptions and discharge instructions with patient and patient  verbalized understanding

## 2018-07-08 ENCOUNTER — Ambulatory Visit
Admission: RE | Admit: 2018-07-08 | Discharge: 2018-07-08 | Disposition: A | Discharge status: Home / Self Care | Payer: PPO | Source: Ambulatory Visit | Attending: Internal Medicine | Admitting: Internal Medicine

## 2018-07-08 DIAGNOSIS — C569 Malignant neoplasm of unspecified ovary: Secondary | ICD-10-CM | POA: Diagnosis not present

## 2018-07-08 DIAGNOSIS — C786 Secondary malignant neoplasm of retroperitoneum and peritoneum: Secondary | ICD-10-CM | POA: Insufficient documentation

## 2018-07-08 DIAGNOSIS — I700 Atherosclerosis of aorta: Secondary | ICD-10-CM | POA: Diagnosis not present

## 2018-07-08 DIAGNOSIS — D1803 Hemangioma of intra-abdominal structures: Secondary | ICD-10-CM | POA: Insufficient documentation

## 2018-07-08 DIAGNOSIS — D1801 Hemangioma of skin and subcutaneous tissue: Secondary | ICD-10-CM | POA: Diagnosis not present

## 2018-07-08 MED ORDER — IOPAMIDOL (ISOVUE-300) INJECTION 61%
100.0000 mL | Freq: Once | INTRAVENOUS | Status: AC | PRN
  Administered 2018-07-08: 100 mL via INTRAVENOUS

## 2018-07-09 ENCOUNTER — Ambulatory Visit: Payer: Self-pay | Admitting: Family Medicine

## 2018-07-09 ENCOUNTER — Inpatient Hospital Stay (HOSPITAL_BASED_OUTPATIENT_CLINIC_OR_DEPARTMENT_OTHER): Payer: PPO | Admitting: Obstetrics and Gynecology

## 2018-07-09 VITALS — BP 128/83 | HR 83 | Resp 18 | Ht 64.0 in | Wt 191.4 lb

## 2018-07-09 DIAGNOSIS — Z5111 Encounter for antineoplastic chemotherapy: Secondary | ICD-10-CM | POA: Diagnosis not present

## 2018-07-09 DIAGNOSIS — Z90710 Acquired absence of both cervix and uterus: Secondary | ICD-10-CM | POA: Diagnosis not present

## 2018-07-09 DIAGNOSIS — R5381 Other malaise: Secondary | ICD-10-CM | POA: Diagnosis not present

## 2018-07-09 DIAGNOSIS — D6959 Other secondary thrombocytopenia: Secondary | ICD-10-CM | POA: Diagnosis not present

## 2018-07-09 DIAGNOSIS — R5383 Other fatigue: Secondary | ICD-10-CM | POA: Diagnosis not present

## 2018-07-09 DIAGNOSIS — Z90722 Acquired absence of ovaries, bilateral: Secondary | ICD-10-CM | POA: Diagnosis not present

## 2018-07-09 DIAGNOSIS — R600 Localized edema: Secondary | ICD-10-CM | POA: Diagnosis not present

## 2018-07-09 DIAGNOSIS — K5900 Constipation, unspecified: Secondary | ICD-10-CM | POA: Diagnosis not present

## 2018-07-09 DIAGNOSIS — T451X5S Adverse effect of antineoplastic and immunosuppressive drugs, sequela: Secondary | ICD-10-CM | POA: Diagnosis not present

## 2018-07-09 DIAGNOSIS — C569 Malignant neoplasm of unspecified ovary: Secondary | ICD-10-CM | POA: Diagnosis not present

## 2018-07-09 DIAGNOSIS — K047 Periapical abscess without sinus: Secondary | ICD-10-CM | POA: Diagnosis not present

## 2018-07-09 DIAGNOSIS — Z803 Family history of malignant neoplasm of breast: Secondary | ICD-10-CM | POA: Diagnosis not present

## 2018-07-09 DIAGNOSIS — Z9221 Personal history of antineoplastic chemotherapy: Secondary | ICD-10-CM | POA: Diagnosis not present

## 2018-07-09 DIAGNOSIS — Z7689 Persons encountering health services in other specified circumstances: Secondary | ICD-10-CM | POA: Diagnosis not present

## 2018-07-09 DIAGNOSIS — E785 Hyperlipidemia, unspecified: Secondary | ICD-10-CM | POA: Diagnosis not present

## 2018-07-09 DIAGNOSIS — Z800 Family history of malignant neoplasm of digestive organs: Secondary | ICD-10-CM | POA: Diagnosis not present

## 2018-07-09 DIAGNOSIS — C786 Secondary malignant neoplasm of retroperitoneum and peritoneum: Secondary | ICD-10-CM | POA: Diagnosis not present

## 2018-07-09 DIAGNOSIS — K219 Gastro-esophageal reflux disease without esophagitis: Secondary | ICD-10-CM | POA: Diagnosis not present

## 2018-07-09 DIAGNOSIS — Z79899 Other long term (current) drug therapy: Secondary | ICD-10-CM | POA: Diagnosis not present

## 2018-07-09 DIAGNOSIS — E039 Hypothyroidism, unspecified: Secondary | ICD-10-CM | POA: Diagnosis not present

## 2018-07-09 DIAGNOSIS — Z23 Encounter for immunization: Secondary | ICD-10-CM | POA: Diagnosis not present

## 2018-07-09 DIAGNOSIS — Z7982 Long term (current) use of aspirin: Secondary | ICD-10-CM | POA: Diagnosis not present

## 2018-07-09 NOTE — Progress Notes (Signed)
Pt in for follow up and CT scan results.  Reports was discharged from hospital on Monday 07/07/18, had went to ED with temp of 101.1, admitted overnight and went home on Augmentin.  Pt states having a dental extraction 2 weeks ago. Pt currently having some diarrhea and took Imodium this morning.

## 2018-07-09 NOTE — Progress Notes (Signed)
Gynecologic Oncology Interval Visit   Referring Provider: Blain Pais, MD  Chief Concern: Platinum-resistant recurrent stage IIIc ovarian cancer  Subjective:  Jamie Conner is a 77 y.o. female who is seen for platinum-resistant recurrent stage IIIc ovarian cancer who returns to clinic today for follow-up. Most recently on carbo-gemzar on 04/09/18 with 4th cycle on 06/25/18.   She was admitted to hospital 07/06/18-07/07/18 for fever of unknown origin. Infectious disease work-up was negative possible dental infection or GI virus as she was having diarrhea. She was treated with broad spectrum antibiotics and discharged home on oral antibiotics.   07/08/18- CT A/P W Contrast IMPRESSION: 1. Further progression in multifocal peritoneal metastatic disease. Several of the lesions are associated with the bowel serosa and place the patient at risk for bowel obstruction, not demonstrated at this time. No significant ascites. 2. No evidence of solid visceral organ metastatic disease. 3. Stable hepatic hemangioma. 4.  Aortic Atherosclerosis (ICD10-I70.0)  CA 125 06/25/18 632 06/04/18 583 05/21/18 292 04/16/18 188 04/09/18 234  She continues to have fatigue and weakness with abdominal bloating and distention. She has had diarrhea since her hospital admission and took Imodium this morning. Swelling of left lymph node in her jaw has resolved.     Gynecologic Oncology History Jamie Conner is a pleasant female who is seen for postoperative visit for stage IIIc high-grade serous ovarian cancer. See prior notes for complete detail.  She also has a history of  carcinoma of breast ( left) status post lumpectomy , radiation therapy and 5 years of tamoxifen. She underwent exploratory Laparotomy, bilateral salpingo-oophorectomy, right ureterolysis, infragastric omentectomy, and optimally debulked to no gross residual  May 4th, 2016. Preop CA125 2354.0.  She started chemotherapy with carboplatinum  and Taxol in dose dense fashion from Jan 02, 2015. She underwent 6 cycles of chemotherapy completed 05/09/2015. Her dose of Taxol was reduced because of myelosuppression and fever in spite of Neulasta.  Nadir CA125 = 9.8  06/06/2015 CT scan abdomen and pelvis IMPRESSION: 1. Interval removal of the large right ovarian mass . Dramatic improvement in the appearance of mesenteric and omental implants. There is a 3 mm potential faint omental nodule at about the level of the umbilicus but for the most part the numerous prior omental and mesenteric tumor implants have resolved completely. 2. 1.8 by 1.2 cm fluid density structure along the splenic hilum, no change from prior, likely a small benign cystic lesion.  3. Other imaging findings of potential clinical significance: 3 cm periampullary duodenal diverticulum.   01/04/16 CA125 34.1  01/10/2016 CT scan chest abdomen and pelvis. 1. New nodular density 12 x 7 mm ties are seen throughout the pelvis. The largest measures 12 x 10 mm best seen on image number 105 of series. A 9 mm nodule is seen in the left pelvis best seen on image number 60 of series 5. These are concerning for possible peritoneal implants. 2. Stable 1.9 cm cystic lesion seen in splenic hilum.  Vaginal biopsy performed on 01/04/2016 negative  She was started on PLD and carboplatin 02/20/2016  05/11/2016 CT scan abdomen and pelvis 1. No acute findings identified within the abdomen or pelvis. 2. Peritoneal nodules within the right side of pelvis are stable the slightly increased in size from previous exam. No new areas of disease identified.  She has completed 6 cycles of  PLD and carboplatin therapy and imaging revealed progressive disease.   08/15/2016 CT scan abdomen and pelvis 1. Study demonstrates slight interval growth  of several peritoneal implants in the low anatomic pelvis indicative of slight progression of disease. No other new peritoneal deposits are noted, and there is no  other metastatic disease noted elsewhere in the chest, abdomen or pelvis. 2. Colonic diverticulosis without evidence of acute diverticulitis at this time. 3. Aortic atherosclerosis, in addition to left main coronary artery disease. Assessment for potential risk factor modification, dietary therapy or pharmacologic therapy may be warranted, if clinically indicated. 4. Additional incidental findings, as above.  Rucaparib denied by insurance.    09/24/2016 She was started on paclitaxel and bevacizumab and received 6 cycles of paclitaxel and bevacizumab 03/04/2017 with evidence of response  CT Abdomen and Pelvis 03/28/2017 IMPRESSION: 1. Status post hysterectomy, without recurrent or metastatic disease. 2.  Possible constipation. 3.  Tiny hiatal hernia. 4. Hepatic hemangiomas. 5. Cystic lesion within the right-side of the vagina is similar over prior exams and may represent a Bartholin's gland cyst.\  MRI pelvis on 07/09/2017 No evidence of recurrent or metastatic carcinoma within the pelvis. Stable 1.5 cm right vaginal wall cyst, consistent with a benign Gardner's duct or Bartholin's gland cyst.  MRI lumbar spine on 07/09/2017 Negative for metastatic disease. Mild lumbar degenerative change without neural impingement.  Continued on maintenance bevacizumab since August 2018 until March 2019.     10/23/2017 CT Abdomen/pelvis IMPRESSION: 1. There is apparent increase soft tissue thickening around the cecal base which appears new from previous exam. Although nonspecific and can occur for variety of reasons including incomplete distention, serosal deposits reflecting peritoneal disease cannot be excluded. Consider further evaluation with PET-CT. 2. New asymmetric soft tissue thickening along the left side of vaginal cuff. 3. Aortic atherosclerosis  10/23/2017 MRI brain IMPRESSION: No acute brain finding. Mild to moderate chronic small-vessel change of the deep white matter. No sign of metastatic  disease to the brain. Sclerotic focus within the C3 vertebral body which is nonspecific. Metastatic disease to bone not excluded given the history breast cancer.  10/23/2017 PET scan  IMPRESSION: 1. Multiple small hypermetabolic foci of tumor observed along the bowel and pelvic mesentery, as well as the vaginal cuff, compatible with early spread of ovarian cancer. 2. No findings of involvement of the thorax, neck, or skeleton. 3. Diffuse mild thyroid activity favoring mild thyroiditis. 4. Other imaging findings of potential clinical significance: Small type 1 hiatal hernia. Mild cardiomegaly. Aortic Atherosclerosis (ICD10-I70.0). Scattered sigmoid colon diverticula. Posterior right hepatic lobe hemangioma.  12/11/2017 CT chest scan  IMPRESSION: 1. No evidence of pulmonary embolus. 2. No acute cardiopulmonary disease. 3. Aortic Atherosclerosis (ICD10-I70.0).  03/28/2018 CT C/A/P and neck IMPRESSION: 1. Progressive multifocal peritoneal metastatic disease. No significant ascites. Several of the lesions are associated with the colonic serosa, including one which protrudes into the lumen of the distal sigmoid colon. No evidence of bowel obstruction at this time.  2. No evidence of solid visceral organ metastasis. No thoracic metastatic disease identified. 3. Stable hepatic hemangioma. 4.  Aortic Atherosclerosis (ICD10-I70.0).  No metastatic disease in the neck.   Seen by Dr. Rogue Bussing on 04/09/2018. She had been on  maintenance bevacizumab until MARCH 2019 when she was diagnosed with PROGRESSION. She was started on PARPi.  April 2019- niraparib [300 mg/day]; 4 days- later STOPPED sec to Chest pain/cardiac spasm; June 1st week-  Switched to olaparib    She was diagnosed with progression based on CT scan and proceeded with carbo-Gem cycle #1; day 1 on 04/09/2018.    CA125 02/20/2016 47.5 03/21/2016 31.1 05/16/2016 37.5 06/13/2016 44.8  06/27/2016 50.4 08/15/2016 64.5 09/24/2016 101.7 10/22/2016  18.2 11/05/2016 13. 0 11/26/2016 12.6 01/21/2017 12.8 02/25/2017 13.3 04/01/2017 14.9 05/13/2017 14.3 06/24/2017 13.9 08/21/2017 19.6 12/13/2017 33.4 01/10/2018 90.4 02/14/2018 133.2 03/20/2018 181.0 04/09/2018 234 04/16/18 188 05/21/18 292 06/04/18 583 06/25/18 632         Genetic testing: ATM mutation [id number].  HRD testing negative ([facility])  Dr. Lisbeth Ply at [facility] was consulted and he recommended genetic counseling for the patient and possibly other family members. He provided a phone number for them to call to schedule that appointment. The number is [phone]. Of note her daughter has tested positive for an ATM mutation carrier and she requested recommendations for who to see at [facility].    Problem List: Patient Active Problem List   Diagnosis Date Noted  . Fever of unknown origin 07/06/2018  . Chest pain 12/10/2017  . STEMI (ST elevation myocardial infarction) ([place]) 12/10/2017  . Ovarian cancer, unspecified laterality ([place]) 09/11/2017  . Counseling regarding goals of care 11/26/2016  . Encounter for monitoring cardiotoxic drug therapy 02/02/2016  . Breast cancer in female [facility]) 12/13/2015  . Peptic ulcer disease 12/12/2015  . DDD (degenerative disc disease), lumbosacral 12/12/2015  . Hyperlipidemia 12/12/2015  . Acute anxiety 12/12/2015  . Insomnia 12/12/2015  . Allergic rhinitis 12/12/2015  . GERD (gastroesophageal reflux disease) 12/12/2015  . Internal hemorrhoid 12/12/2015  . OA (osteoarthritis) 12/12/2015  . Osteopenia 12/12/2015  . Hypothyroid 04/11/2015  . Benign essential HTN 02/01/2015  . Retroperitoneal fibrosis   . Combined fat and carbohydrate induced hyperlipemia 01/04/2015  . Awareness of heartbeats 01/04/2015  . Beat, premature ventricular 01/04/2015  . Malignant neoplasm of ovary ([place]) 12/16/2014  . MI (mitral incompetence) 09/02/2014  . AF (paroxysmal atrial fibrillation) ([place]) 09/02/2014    Past Medical History: Past Medical History:  Diagnosis  Date  . Atrial fibrillation ([place])   . Breast cancer ([place]) 1998  . Cancer of breast ([place]) 1998   Left/radiation  . CINV (chemotherapy-induced nausea and vomiting)   . GERD (gastroesophageal reflux disease)   . Hyperlipidemia   . Hypothyroidism   . Mitral insufficiency   . Ovarian cancer ([place])    s/p BSO optimal tumor debulking May 2016/chemo  . Personal history of chemotherapy since 2016   ovarian cancer  . Personal history of radiation therapy 1998  . PVC (premature ventricular contraction)     Past Surgical History: Past Surgical History:  Procedure Laterality Date  . ABDOMINAL HYSTERECTOMY    . BILATERAL SALPINGOOPHORECTOMY  May 2016   with optimal tumor debulking   . BREAST LUMPECTOMY Left 1998  . BREAST SURGERY     Left  . CESAREAN SECTION     x2  . CHOLECYSTECTOMY    . COLONOSCOPY  2006  . DEBULKING N/A 12/22/2014   Procedure: DEBULKING;  Surgeon: Gillis Ends, MD;  Location: [facility] ORS;  Service: Gynecology;  Laterality: N/A;  . LAPAROTOMY N/A 12/22/2014   Procedure: EXPLORATORY LAPAROTOMY;  Surgeon: Robert Bellow, MD;  Location: [facility] ORS;  Service: General;  Laterality: N/A;  . LAPAROTOMY WITH STAGING N/A 12/22/2014   Procedure: LAPAROTOMY WITH STAGING;  Surgeon: Gillis Ends, MD;  Location: [facility] ORS;  Service: Gynecology;  Laterality: N/A;  . LEFT HEART CATH AND CORONARY ANGIOGRAPHY N/A 12/10/2017   Procedure: LEFT HEART CATH AND CORONARY ANGIOGRAPHY;  Surgeon: Yolonda Kida, MD;  Location: [facility];  Service: Cardiovascular;  Laterality: N/A;  . PERIPHERAL VASCULAR CATHETERIZATION Left 12/22/2014   Procedure:  PORTA CATH INSERTION;  Surgeon: Robert Bellow, MD;  Location: [facility];  Service: General;  Laterality: Left;  . PORTACATH PLACEMENT Right 12/22/2014   Procedure: INSERTION PORT-A-CATH;  Surgeon: Robert Bellow, MD;  Location: [facility];  Service: General;  Laterality: Right;  . SALPINGOOPHORECTOMY Bilateral 12/22/2014    Procedure: SALPINGO OOPHORECTOMY;  Surgeon: Gillis Ends, MD;  Location: [facility];  Service: Gynecology;  Laterality: Bilateral;  . UPPER GI ENDOSCOPY  09/25/04   hiatus hernia, schatzki ring and a single gastric polyp    Past Gynecologic History:  See HPI  OB History:  OB History  Gravida Para Term Preterm AB Living  2         2  SAB TAB Ectopic Multiple Live Births               # Outcome Date GA Lbr Len/2nd Weight Sex Delivery Anes PTL Lv  2 Gravida           1 Gravida             Obstetric Comments  1st Menstrual Cycle:  14  1st Pregnancy:  53    Family History: Family History  Problem Relation Age of Onset  . Cancer Mother        breast, throat, and stomach  . Breast cancer Mother   . Heart disease Father   . Pulmonary embolism Sister   . Cancer Brother 60       angiosarcoma of the chest; carcinoid small intestinal tumor  . Hypertension Brother   . Stroke Brother   . Cancer Maternal Aunt        breast cancer  . Cancer Maternal Grandfather 81       pancreatic    Social History: Social History   Socioeconomic History  . Marital status: Married    Spouse name: Not on file  . Number of children: 2  . Years of education: Not on file  . Highest education level: Bachelor's degree (e.g., BA, AB, BS)  Occupational History  . Occupation: retired  Scientific laboratory technician  . Financial resource strain: Not hard at all  . Food insecurity:    Worry: Never true    Inability: Never true  . Transportation needs:    Medical: No    Non-medical: No  Tobacco Use  . Smoking status: Never Smoker  . Smokeless tobacco: Never Used  Substance and Sexual Activity  . Alcohol use: No  . Drug use: No  . Sexual activity: Not Currently  Lifestyle  . Physical activity:    Days per week: Not on file    Minutes per session: Not on file  . Stress: To some extent  Relationships  . Social connections:    Talks on phone: Not on file    Gets together: Not on file    Attends  religious service: Not on file    Active member of club or organization: Not on file    Attends meetings of clubs or organizations: Not on file    Relationship status: Not on file  . Intimate partner violence:    Fear of current or ex partner: Not on file    Emotionally abused: Not on file    Physically abused: Not on file    Forced sexual activity: Not on file  Other Topics Concern  . Not on file  Social History Narrative  . Not on file    Allergies: Allergies  Allergen Reactions  . Carafate [Sucralfate]  Rash  . Sulfa Antibiotics Rash    Current Medications: Current Outpatient Medications  Medication Sig Dispense Refill  . acetaminophen (TYLENOL) 500 MG tablet Take 500 mg by mouth every 6 (six) hours as needed.    Marland Kitchen alum & mag hydroxide-simeth (MAALOX/MYLANTA) 200-200-20 MG/5ML suspension Take 15 mLs by mouth every 6 (six) hours as needed for indigestion or heartburn. 355 mL 0  . amoxicillin-clavulanate (AUGMENTIN) 875-125 MG tablet Take 1 tablet by mouth every 12 (twelve) hours for 5 days. 10 tablet 0  . aspirin EC 325 MG tablet Take 1 tablet (325 mg total) by mouth daily. 100 tablet 3  . Cholecalciferol (VITAMIN D) 2000 units tablet Take 1 tablet (2,000 Units total) by mouth daily. 30 tablet 12  . fluticasone (FLONASE) 50 MCG/ACT nasal spray Place 1 spray into both nostrils daily as needed.     Marland Kitchen levothyroxine (SYNTHROID, LEVOTHROID) 50 MCG tablet TAKE ONE TABLET ON AN EMPTY STOMACH WITHA GLASS OF WATER AT LEAST 30 TO 60 MINUTES BEFORE BREAKFAST 30 tablet 12  . lidocaine-prilocaine (EMLA) cream Apply to port area 30-45 mins prior to chemo. 30 g 5  . loratadine (CLARITIN) 10 MG tablet Take 10 mg by mouth daily as needed.     Marland Kitchen LORazepam (ATIVAN) 0.5 MG tablet TAKE ONE TABLET BY MOUTH AT BEDTIME AS NEEDED FOR ANXIETY/SLEEP 30 tablet 0  . Lysine 500 MG TABS Take 1 tablet by mouth daily as needed.     . metoprolol succinate (TOPROL-XL) 25 MG 24 hr tablet Take 1 tablet by mouth  daily.    . Multiple Vitamin (MULTIVITAMIN) capsule Take 1 capsule by mouth daily.    . pantoprazole (PROTONIX) 40 MG tablet TAKE 1 TABLET BY MOUTH DAILY 30 tablet 1  . chlorhexidine (PERIDEX) 0.12 % solution     . Docosanol (ABREVA EX) Apply 1 application topically as needed. Fever blisters    . furosemide (LASIX) 20 MG tablet Take 0.5 tablets (10 mg total) by mouth daily. (Patient not taking: Reported on 06/04/2018) 30 tablet 0  . lactulose (CHRONULAC) 10 GM/15ML solution Take 15 mLs (10 g total) by mouth daily as needed for moderate constipation. (Patient not taking: Reported on 07/09/2018) 236 mL 0  . metroNIDAZOLE (FLAGYL) 500 MG tablet     . ondansetron (ZOFRAN) 4 MG tablet One pill 30-60 mins prior to taking zejula to prevent nausea/vomitting. (Patient not taking: Reported on 07/09/2018) 45 tablet 3  . ondansetron (ZOFRAN) 8 MG tablet Take 1 tablet (8 mg total) by mouth every 8 (eight) hours as needed for nausea or vomiting. (Patient not taking: Reported on 07/09/2018) 20 tablet 4  . polyethylene glycol (MIRALAX / GLYCOLAX) packet Take 17 g by mouth daily as needed.    . prochlorperazine (COMPAZINE) 10 MG tablet Take 1 tablet (10 mg total) by mouth every 6 (six) hours as needed for nausea or vomiting. (Patient not taking: Reported on 07/09/2018) 30 tablet 4   No current facility-administered medications for this visit.     Review of Systems General:  Fatigue, weakness Skin: lymph node swelling in jaw Eyes: no complaints HEENT: no complaints Breasts: no complaints Pulmonary: no complaints Cardiac: no complaints Gastrointestinal: abdominal bloating/distention, diarrhea Genitourinary/Sexual: no complaints Ob/Gyn: no complaints Musculoskeletal: no complaints Hematology: no complaints Neurologic/Psych: numbness, tingling  Objective:  Physical Examination:  BP 128/83 (BP Location: Left Arm, Patient Position: Sitting)   Pulse 83   Resp 18   Ht '5\' 4"'$  (1.626 m)   Wt 191 lb 6 oz  (  86.8 kg)   BMI 32.85 kg/m . Marland Kitchen    ECOG Performance Status: 1 - Symptomatic but completely ambulatory  General appearance: alert, cooperative and appears stated age 88: Atraumatic and normocephalic Lymph node survey: non-palpable inguinal and supraclavicular. Palpable left submandibular lymph node, nontender 2cm in size. Palpable right submandibular ln 2 cm in size, non-tender.  Cardiovascular: Regulatory rate and rhythm Respiratory: Bilateral clear to auscultation Abdomen: Soft nontender and nondistended. Incision all well healed. No hernias, no masses, no ascites.  Extremities: No edema Skin exam - Well healed incisions.  Neurological exam reveals alert, oriented, normal speech, no focal findings or movement disorder noted  Pelvic: Pelvic exam; Chaperoned by nurse: Pelvic: Vulva: normal appearing vulva with no masses, tenderness or lesions, atrophy; Vagina: normal left paravaginal cyst approximately mid portion of the vagina, no discharge, on BME the left paravaginal cyst was stable but there is a irregular 3 cm nodular masses at the vaginal cuff; Uterus and Cervix: surgically absent; Rectal: confirms. Approximately 3cm nodule involving vaginal cuff and separate from rectal mucosa.   Lab Review CA125 as noted above  Radiologic Imaging: Personally reviewed with patient and her husband    Assessment:  Jamie Conner is a 77 y.o. female diagnosed with optimally debulked stage IIIc  high-grade serous ovarian cancer, initially diagnosed 12/22/2014 s/p chemotherapy with complete response. Recurrent ovarian cancer diagnosed 01/10/2016.  ATM mutation positive. HRD negative. Extensive family history of cancer (breast, pancreatic, throat, gastric, adenosarcoma of the chest, carcinoid tumor of the small bowel) in several first degree relatives.  Vaginal lesion, granulation tissue.  Recurrent platinum-sensitive ovarian cancer with stable disease on PLD/carboplatin based on RECIST criteria with  ultimate progression. Platinum-resistant disease initiated on paclitaxel and bevacizumab with evidence of complete clinical response based on exam, CT scan, and CA125. Disease controlled on maintenance bevacizumab until MARCH 2019. Rapid progression on PARPi based on CA125 and imaging. Currently on gem/platin based therapy with evidence of progression based on exam, CT scan and CA125.   History of left-sided Sciatica with ambulatory impairment improved with PT. MRI c/w DJD.  Plan:   Problem List Items Addressed This Visit      Endocrine   Malignant neoplasm of ovary ([place]) - Primary (Chronic)   We discussed treatment options. She I willing to consider clinical trial but worried about insurance coverage and cost. She may be a candidate for the [employer] study. She is seeing Dr. Rogue Bussing tomorrow to discuss other treatment options which include topotecan, docetaxel, and oral cyclophosphamide to name a few chemotherapy regimens. We discuss response rates range from 5-13% in phase III trials to ~20% in phase II trials.   She does have HCPOA and [place]. She is interested in further therapy. We very briefly discussed that hospice may be a recommendation in the future, but given her excellent performance status it would be my recommendation to continue on therapy with the goal of optimizing disease control.   Follow up to be determined if she opts for a clinical trial.   Beckey Rutter, DNP, AGNP-C [place] at [facility] [phone] (work cell) [phone] (office)   I personally had a face to face interaction and evaluated the patient jointly with the NP, Ms. Beckey Rutter.  I have reviewed her history and available records and have performed the key portions of the physical exam including HEENT, general, abdominal exam, pelvic exam with my findings confirming those documented above by the APP.  I have discussed the case with the  APP and the patient.  I agree  with the above documentation, assessment and plan which was fully formulated by me.  Counseling was completed by me.   I personally saw the patient and performed a substantive portion of this encounter in conjunction with the listed APP as documented above.  A total of >40 minutes were spent with the patient/family today; 50% was spent in education, counseling and coordination of care for ovarian cancer.  I spoke with Dr. Rogue Bussing regarding Ms. Rago's status and potential treatment options.   Saraiah Bhat Gaetana Michaelis, MD

## 2018-07-10 ENCOUNTER — Inpatient Hospital Stay (HOSPITAL_BASED_OUTPATIENT_CLINIC_OR_DEPARTMENT_OTHER): Payer: PPO | Admitting: Internal Medicine

## 2018-07-10 ENCOUNTER — Inpatient Hospital Stay: Payer: PPO

## 2018-07-10 ENCOUNTER — Encounter: Payer: Self-pay | Admitting: Internal Medicine

## 2018-07-10 ENCOUNTER — Other Ambulatory Visit: Payer: Self-pay

## 2018-07-10 VITALS — BP 142/76 | HR 67 | Temp 97.0°F | Resp 20 | Ht 64.0 in | Wt 191.0 lb

## 2018-07-10 DIAGNOSIS — R600 Localized edema: Secondary | ICD-10-CM | POA: Diagnosis not present

## 2018-07-10 DIAGNOSIS — D6959 Other secondary thrombocytopenia: Secondary | ICD-10-CM | POA: Diagnosis not present

## 2018-07-10 DIAGNOSIS — E039 Hypothyroidism, unspecified: Secondary | ICD-10-CM

## 2018-07-10 DIAGNOSIS — C569 Malignant neoplasm of unspecified ovary: Secondary | ICD-10-CM

## 2018-07-10 DIAGNOSIS — R5383 Other fatigue: Secondary | ICD-10-CM | POA: Diagnosis not present

## 2018-07-10 DIAGNOSIS — E785 Hyperlipidemia, unspecified: Secondary | ICD-10-CM

## 2018-07-10 DIAGNOSIS — Z23 Encounter for immunization: Secondary | ICD-10-CM

## 2018-07-10 DIAGNOSIS — R5381 Other malaise: Secondary | ICD-10-CM | POA: Diagnosis not present

## 2018-07-10 DIAGNOSIS — K219 Gastro-esophageal reflux disease without esophagitis: Secondary | ICD-10-CM | POA: Diagnosis not present

## 2018-07-10 DIAGNOSIS — Z90710 Acquired absence of both cervix and uterus: Secondary | ICD-10-CM | POA: Diagnosis not present

## 2018-07-10 DIAGNOSIS — Z90722 Acquired absence of ovaries, bilateral: Secondary | ICD-10-CM

## 2018-07-10 DIAGNOSIS — Z803 Family history of malignant neoplasm of breast: Secondary | ICD-10-CM

## 2018-07-10 DIAGNOSIS — T451X5S Adverse effect of antineoplastic and immunosuppressive drugs, sequela: Secondary | ICD-10-CM

## 2018-07-10 DIAGNOSIS — Z7982 Long term (current) use of aspirin: Secondary | ICD-10-CM

## 2018-07-10 DIAGNOSIS — C786 Secondary malignant neoplasm of retroperitoneum and peritoneum: Secondary | ICD-10-CM | POA: Diagnosis not present

## 2018-07-10 DIAGNOSIS — Z800 Family history of malignant neoplasm of digestive organs: Secondary | ICD-10-CM

## 2018-07-10 DIAGNOSIS — Z5111 Encounter for antineoplastic chemotherapy: Secondary | ICD-10-CM | POA: Diagnosis not present

## 2018-07-10 DIAGNOSIS — Z79899 Other long term (current) drug therapy: Secondary | ICD-10-CM

## 2018-07-10 LAB — COMPREHENSIVE METABOLIC PANEL
ALBUMIN: 3.4 g/dL — AB (ref 3.5–5.0)
ALT: 17 U/L (ref 0–44)
ANION GAP: 8 (ref 5–15)
AST: 25 U/L (ref 15–41)
Alkaline Phosphatase: 98 U/L (ref 38–126)
BUN: 10 mg/dL (ref 8–23)
CO2: 24 mmol/L (ref 22–32)
Calcium: 9 mg/dL (ref 8.9–10.3)
Chloride: 105 mmol/L (ref 98–111)
Creatinine, Ser: 1.03 mg/dL — ABNORMAL HIGH (ref 0.44–1.00)
GFR calc Af Amer: >60 mL/min (ref >60)
GFR calc non Af Amer: 52 mL/min — ABNORMAL LOW (ref >60)
GLUCOSE: 100 mg/dL — AB (ref 70–99)
POTASSIUM: 3.8 mmol/L (ref 3.5–5.1)
SODIUM: 137 mmol/L (ref 135–145)
Total Bilirubin: 0.4 mg/dL (ref 0.3–1.2)
Total Protein: 7 g/dL (ref 6.5–8.1)

## 2018-07-10 LAB — CBC WITH DIFFERENTIAL/PLATELET
Abs Immature Granulocytes: 0.07 10*3/uL (ref 0.00–0.07)
BASOS PCT: 0 %
Basophils Absolute: 0 10*3/uL (ref 0.0–0.1)
Eosinophils Absolute: 0 10*3/uL (ref 0.0–0.5)
Eosinophils Relative: 0 %
HEMATOCRIT: 26.7 % — AB (ref 36.0–46.0)
Hemoglobin: 8.3 g/dL — ABNORMAL LOW (ref 12.0–15.0)
IMMATURE GRANULOCYTES: 1 %
LYMPHS ABS: 1.9 10*3/uL (ref 0.7–4.0)
Lymphocytes Relative: 32 %
MCH: 31.2 pg (ref 26.0–34.0)
MCHC: 31.1 g/dL (ref 30.0–36.0)
MCV: 100.4 fL — ABNORMAL HIGH (ref 80.0–100.0)
Monocytes Absolute: 0.8 10*3/uL (ref 0.1–1.0)
Monocytes Relative: 14 %
NEUTROS ABS: 3.2 10*3/uL (ref 1.7–7.7)
NEUTROS PCT: 53 %
PLATELETS: 82 10*3/uL — AB (ref 150–400)
RBC: 2.66 MIL/uL — ABNORMAL LOW (ref 3.87–5.11)
RDW: 18.7 % — AB (ref 11.5–15.5)
WBC: 6 10*3/uL (ref 4.0–10.5)
nRBC: 0 % (ref 0.0–0.2)

## 2018-07-10 MED ORDER — INFLUENZA VAC SPLIT HIGH-DOSE 0.5 ML IM SUSY
0.5000 mL | PREFILLED_SYRINGE | Freq: Once | INTRAMUSCULAR | Status: AC
  Administered 2018-07-10: 0.5 mL via INTRAMUSCULAR

## 2018-07-10 NOTE — Progress Notes (Signed)
DISCONTINUE ON PATHWAY REGIMEN - Ovarian     A cycle is every 21 days:     Gemcitabine      Carboplatin   **Always confirm dose/schedule in your pharmacy ordering system**  REASON: Disease Progression PRIOR TREATMENT: [id number]: Carboplatin AUC=4 D1 + Gemcitabine 800 mg/m2 D1, 8 q21 Days; Re-evaluate Every 3 Cycles, Treat until Complete Response, Unacceptable Toxicity, or Disease Progression TREATMENT RESPONSE: Progressive Disease (PD)  START ON PATHWAY REGIMEN - Ovarian     Administer weekly:     Paclitaxel   **Always confirm dose/schedule in your pharmacy ordering system**  Patient Characteristics: Recurrent or Progressive Disease, [place] and Beyond, BRCA Mutation Absent/Unknown Therapeutic Status: Recurrent or Progressive Disease BRCA Mutation Status: Absent Line of Therapy: Fourth Line and Beyond  Intent of Therapy: Non-Curative / Palliative Intent, Discussed with Patient

## 2018-07-10 NOTE — Assessment & Plan Note (Addendum)
#  RECURRENT HIGH GRADE SEROUS ovarian cancer; November 18th-CT abdomen pelvis shows progression-peritoneal/serosal; but no ascites.  Ca-125 continues to rise.  #Discussed that would recommend discontinuation of carboplatin gemcitabine-given the progressive disease.  Patient understands treatments are also palliative not curative.  She wishes to proceed with treatments.  #Discussed the option of next line therapy that would include-Taxotere weekly/clinical trial option-including antibody drug conjugate at [facility].  Patient met with gynecology oncology yesterday.  Currently awaiting eligibility for the clinical trial.  Patient understand that she is at higher risk for potential toxicities-given multiple lines of previous therapies.  #If patient is not a candidate for clinical trial-would proceed with Taxotere weekly.  Discussed the potential side effects including but not limited to-increasing fatigue, nausea vomiting, diarrhea, hair loss, sores in the mouth, increase risk of infection and also neuropathy.  Patient will need premedication steroids-prior to each infusion.  # PN- G-1-stable.   #Thrombocytopenia mild 82 secondary to recent chemotherapy.  Monitor now.  #DISPOSITION: # flu shot today # follow up to be decided-Dr.B  Addendum: Discussed with Dr. Theora Gianotti; that patient is likely candidate for clinical trial however since the trial is undergoing amendments-trial would not open the patient for the next few weeks.  For now the recommendation is to proceed with docetaxel chemotherapy.  Will inform patient.  # 40 minutes face-to-face with the patient discussing the above plan of care; more than 50% of time spent on prognosis/ natural history; counseling and coordination.

## 2018-07-10 NOTE — Progress Notes (Signed)
[place] OFFICE PROGRESS NOTE  Patient Care Team: Jerrol Banana., MD as PCP - General (Family Medicine) Gillis Ends, MD as Referring Physician (Obstetrics and Gynecology) Cammie Sickle, MD as Consulting Physician (Oncology) Corey Skains, MD as Consulting Physician (Cardiology) Marval Regal, NP as Nurse Practitioner (Nurse Practitioner) Manya Silvas, MD as Consulting Physician (Gastroenterology)  Cancer Staging Malignant neoplasm of ovary [facility]) Staging form: Ovary, AJCC 7th Edition - Clinical: Stage IIIC (T3c, N0, M0) - Unsigned    Oncology History   # April- MAY 2016- HIGH GRADE SEROUS OVARIAN CANCER STAGE IIIC; CA-125 +2300+;  s/p OPTIMAL DEBULKING SURGERY   # MAY 2016[name] [finished in Sep 19th 2017]  # MAY CT 2017- RECURRENT/Peritoneal carcinomatosis/pelvic implant ~1.2cm/Ca-125-34;   # July 3rd 2017- CARBO-DOXIL q 4W [with neulasta]; CT May 11 2016- slight increase peritoneal / stable nodules. Cont chemo [finished DEC 2017]. Last dose CARBO-07/11/2016; DEC 28th 2017 CT- Increase in peritoneal deposits; increasing Ca 125 [60s]- PLATINUM REFRACTORY  # Jan 2018- Taxol-Avastin x6 cycles; Aug 2018- CR; Aug 2018- start Avastin Maintenance; MARCH 2019- PROGRESSION- STOP avastin.   # MID April 2019- Zejula [300 mg/day]; 4 days- later STOPPED sec to Chest pain;  # June 1st week- Lynparza; AUG 2019- Progression of disease  # AUG, 21st 2019- START CARBO-GEM x4 cycles; NOV, 19th CT Progression/rising Ca-125.   # DEC 1st week- Taxotere weekly.    # May 2019- TAKASUBO ? 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  ---------------------------------------------------------------------    DIAGNOSIS: MAY 2017; RECURRENT OVARIAN CA-high-grade serous/platinum refractory  STAGE: IV    ;GOALS: PALLIATIVE  CURRENT/MOST RECENT THERAPY: Docetaxel      Malignant neoplasm of ovary ([place])   04/08/2018 - 07/08/2018 Chemotherapy    The patient had palonosetron (ALOXI) injection 0.25 mg, 0.25 mg, Intravenous,  Once, 4 of 4 cycles Administration: 0.25 mg (04/09/2018), 0.25 mg (04/30/2018), 0.25 mg (06/04/2018), 0.25 mg (06/25/2018) pegfilgrastim-cbqv (UDENYCA) injection 6 mg, 6 mg, Subcutaneous, Once, 3 of 3 cycles Administration: 6 mg (05/01/2018), 6 mg (06/05/2018), 6 mg (06/26/2018) CARBOplatin (PARAPLATIN) 430 mg in sodium chloride 0.9 % 250 mL chemo infusion, 430 mg (100 % of original dose 430.5 mg), Intravenous,  Once, 4 of 4 cycles Dose modification:   (original dose 430.5 mg, Cycle 1) Administration: 430 mg (04/09/2018), 430 mg (04/30/2018), 430 mg (06/04/2018), 430 mg (06/25/2018) gemcitabine (GEMZAR) 2,000 mg in sodium chloride 0.9 % 250 mL chemo infusion, 1,976 mg, Intravenous,  Once, 4 of 4 cycles Administration: 2,000 mg (04/09/2018), 2,000 mg (04/30/2018), 2,000 mg (06/04/2018), 2,000 mg (06/25/2018)  for chemotherapy treatment.      Ovarian cancer, unspecified laterality ([place])   07/09/2018 -  Chemotherapy    The patient had DOCEtaxel (TAXOTERE) 50 mg in sodium chloride 0.9 % 150 mL chemo infusion, 25 mg/m2, Intravenous,  Once, 0 of 4 cycles  for chemotherapy treatment.        INTERVAL HISTORY:  RAKIA FRAYNE 77 y.o.  female pleasant patient above history of high-grade serous ovarian cancer currently on [place] gemcitabine currently status post 4 cycles of chemotherapy.  Patient was recently admitted to the hospital for fever approximately  10 days post last chemotherapy.  Patient was not neutropenic.  Patient had a tooth infection that was treated with antibiotics.  Patient  had a CT scan for reevaluation-she is here to discuss treatment options.  Patient met with gynecology oncology yesterday.  Fevers are resolved.  No nausea no vomiting.  Mild to moderate fatigue.  Intermittent constipation.  No blood in stools black or stools.  No current swelling of the legs.  Review of Systems  Constitutional: Positive for malaise/fatigue. Negative for chills, diaphoresis, fever and weight loss.  HENT: Negative for nosebleeds and sore throat.   Eyes: Negative for double vision.  Respiratory: Negative for cough, hemoptysis, sputum production, shortness of breath and wheezing.   Cardiovascular: Negative for chest pain, palpitations, orthopnea and leg swelling.  Gastrointestinal: Positive for constipation. Negative for abdominal pain, blood in stool, diarrhea, heartburn, melena, nausea and vomiting.  Genitourinary: Negative for dysuria, frequency and urgency.  Musculoskeletal: Negative for back pain and joint pain.  Skin: Negative.  Negative for itching and rash.  Neurological: Negative for dizziness, tingling, focal weakness, weakness and headaches.  Endo/Heme/Allergies: Does not bruise/bleed easily.  Psychiatric/Behavioral: Negative for depression. The patient is not nervous/anxious and does not have insomnia.       PAST MEDICAL HISTORY :  Past Medical History:  Diagnosis Date  . Atrial fibrillation ([place])   . Breast cancer ([place]) 1998  . Cancer of breast ([place]) 1998   Left/radiation  . CINV (chemotherapy-induced nausea and vomiting)   . GERD (gastroesophageal reflux disease)   . Hyperlipidemia   . Hypothyroidism   . Mitral insufficiency   . Ovarian cancer ([place])    s/p BSO optimal tumor debulking May 2016/chemo  . Personal history of chemotherapy since 2016   ovarian cancer  . Personal history of radiation therapy 1998  . PVC (premature ventricular contraction)     PAST SURGICAL HISTORY :   Past Surgical History:  Procedure Laterality Date  . ABDOMINAL  HYSTERECTOMY    . BILATERAL SALPINGOOPHORECTOMY  May 2016   with optimal tumor debulking   . BREAST LUMPECTOMY Left 1998  . BREAST SURGERY     Left  . CESAREAN SECTION     x2  . CHOLECYSTECTOMY    . COLONOSCOPY  2006  . DEBULKING N/A 12/22/2014   Procedure: DEBULKING;  Surgeon: Gillis Ends, MD;  Location: [facility] ORS;  Service: Gynecology;  Laterality: N/A;  . LAPAROTOMY N/A 12/22/2014   Procedure: EXPLORATORY LAPAROTOMY;  Surgeon: Robert Bellow, MD;  Location: [facility] ORS;  Service: General;  Laterality: N/A;  . LAPAROTOMY WITH STAGING N/A 12/22/2014   Procedure: LAPAROTOMY WITH STAGING;  Surgeon: Gillis Ends, MD;  Location: [facility] ORS;  Service: Gynecology;  Laterality: N/A;  . LEFT HEART CATH AND CORONARY ANGIOGRAPHY N/A 12/10/2017   Procedure: LEFT HEART CATH AND CORONARY ANGIOGRAPHY;  Surgeon: Yolonda Kida, MD;  Location: [place] CV [facility];  Service: Cardiovascular;  Laterality: N/A;  . PERIPHERAL VASCULAR CATHETERIZATION Left 12/22/2014   Procedure: PORTA CATH INSERTION;  Surgeon: Robert Bellow, MD;  Location: [facility] ORS;  Service: General;  Laterality: Left;  . PORTACATH PLACEMENT Right 12/22/2014   Procedure: INSERTION PORT-A-CATH;  Surgeon: Robert Bellow, MD;  Location: [facility] ORS;  Service: General;  Laterality: Right;  . SALPINGOOPHORECTOMY Bilateral 12/22/2014   Procedure: SALPINGO OOPHORECTOMY;  Surgeon: Gillis Ends, MD;  Location: [facility] ORS;  Service: Gynecology;  Laterality: Bilateral;  . UPPER GI ENDOSCOPY  09/25/04   hiatus hernia, schatzki ring  and a single gastric polyp    FAMILY HISTORY :   Family History  Problem Relation Age of Onset  . Cancer Mother        breast, throat, and stomach  . Breast cancer Mother   . Heart disease Father   . Pulmonary embolism Sister   . Cancer Brother 60       angiosarcoma of the chest; carcinoid small intestinal tumor  . Hypertension Brother   . Stroke Brother   . Cancer Maternal Aunt        breast  cancer  . Cancer Maternal Grandfather 52       pancreatic    SOCIAL HISTORY:   Social History   Tobacco Use  . Smoking status: Never Smoker  . Smokeless tobacco: Never Used  Substance Use Topics  . Alcohol use: No  . Drug use: No    ALLERGIES:  is allergic to carafate [sucralfate] and sulfa antibiotics.  MEDICATIONS:  Current Outpatient Medications  Medication Sig Dispense Refill  . acetaminophen (TYLENOL) 500 MG tablet Take 500 mg by mouth every 6 (six) hours as needed.    Marland Kitchen alum & mag hydroxide-simeth (MAALOX/MYLANTA) 200-200-20 MG/5ML suspension Take 15 mLs by mouth every 6 (six) hours as needed for indigestion or heartburn. 355 mL 0  . amoxicillin-clavulanate (AUGMENTIN) 875-125 MG tablet Take 1 tablet by mouth every 12 (twelve) hours for 5 days. 10 tablet 0  . aspirin EC 325 MG tablet Take 1 tablet (325 mg total) by mouth daily. 100 tablet 3  . chlorhexidine (PERIDEX) 0.12 % solution     . Cholecalciferol (VITAMIN D) 2000 units tablet Take 1 tablet (2,000 Units total) by mouth daily. 30 tablet 12  . Docosanol (ABREVA EX) Apply 1 application topically as needed. Fever blisters    . fluticasone (FLONASE) 50 MCG/ACT nasal spray Place 1 spray into both nostrils daily as needed.     Marland Kitchen levothyroxine (SYNTHROID, LEVOTHROID) 50 MCG tablet TAKE ONE TABLET ON AN EMPTY STOMACH WITHA GLASS OF WATER AT LEAST 30 TO 60 MINUTES BEFORE BREAKFAST 30 tablet 12  . lidocaine-prilocaine (EMLA) cream Apply to port area 30-45 mins prior to chemo. 30 g 5  . loratadine (CLARITIN) 10 MG tablet Take 10 mg by mouth daily as needed.     Marland Kitchen LORazepam (ATIVAN) 0.5 MG tablet TAKE ONE TABLET BY MOUTH AT BEDTIME AS NEEDED FOR ANXIETY/SLEEP 30 tablet 0  . Lysine 500 MG TABS Take 1 tablet by mouth daily as needed.     . metoprolol succinate (TOPROL-XL) 25 MG 24 hr tablet Take 1 tablet by mouth daily.    . Multiple Vitamin (MULTIVITAMIN) capsule Take 1 capsule by mouth daily.    . pantoprazole (PROTONIX) 40 MG  tablet TAKE 1 TABLET BY MOUTH DAILY 30 tablet 1  . furosemide (LASIX) 20 MG tablet Take 0.5 tablets (10 mg total) by mouth daily. (Patient not taking: Reported on 06/04/2018) 30 tablet 0  . lactulose (CHRONULAC) 10 GM/15ML solution Take 15 mLs (10 g total) by mouth daily as needed for moderate constipation. (Patient not taking: Reported on 07/09/2018) 236 mL 0  . ondansetron (ZOFRAN) 4 MG tablet One pill 30-60 mins prior to taking zejula to prevent nausea/vomitting. (Patient not taking: Reported on 07/09/2018) 45 tablet 3  . ondansetron (ZOFRAN) 8 MG tablet Take 1 tablet (8 mg total) by mouth every 8 (eight) hours as needed for nausea or vomiting. (Patient not taking: Reported on 07/09/2018) 20 tablet 4  . polyethylene glycol (  MIRALAX / GLYCOLAX) packet Take 17 g by mouth daily as needed.    . prochlorperazine (COMPAZINE) 10 MG tablet Take 1 tablet (10 mg total) by mouth every 6 (six) hours as needed for nausea or vomiting. (Patient not taking: Reported on 07/09/2018) 30 tablet 4   No current facility-administered medications for this visit.     PHYSICAL EXAMINATION: ECOG PERFORMANCE STATUS: 1 - Symptomatic but completely ambulatory  BP (!) 142/76 (Patient Position: Sitting)   Pulse 67   Temp (!) 97 F (36.1 C) (Tympanic)   Resp 20   Ht '5\' 4"'$  (1.626 m)   Wt 191 lb (86.6 kg)   BMI 32.79 kg/m   Filed Weights   07/10/18 1054  Weight: 191 lb (86.6 kg)    Physical Exam  Constitutional: She is oriented to person, place, and time and well-developed, well-nourished, and in no distress.  Accompanied by husband.  She is walking by herself.  HENT:  Head: Normocephalic and atraumatic.  Mouth/Throat: Oropharynx is clear and moist. No oropharyngeal exudate.  Eyes: Pupils are equal, round, and reactive to light.  Neck: Normal range of motion. Neck supple.  Cardiovascular: Normal rate and regular rhythm.  Pulmonary/Chest: No respiratory distress. She has no wheezes.  Abdominal: Soft. Bowel  sounds are normal. She exhibits no distension and no mass. There is no tenderness. There is no rebound and no guarding.  Musculoskeletal: Normal range of motion. She exhibits no edema or tenderness.  Neurological: She is alert and oriented to person, place, and time.  Skin: Skin is warm.  Psychiatric: Affect normal.       LABORATORY DATA:  I have reviewed the data as listed    Component Value Date/Time   NA 137 07/10/2018 1019   NA 140 12/14/2014 1037   K 3.8 07/10/2018 1019   K 4.4 12/14/2014 1037   CL 105 07/10/2018 1019   CL 106 12/14/2014 1037   CO2 24 07/10/2018 1019   CO2 26 12/14/2014 1037   GLUCOSE 100 (H) 07/10/2018 1019   GLUCOSE 87 12/14/2014 1037   BUN 10 07/10/2018 1019   BUN 12 12/14/2014 1037   CREATININE 1.03 (H) 07/10/2018 1019   CREATININE 0.97 12/14/2014 1037   CALCIUM 9.0 07/10/2018 1019   CALCIUM 9.2 12/14/2014 1037   PROT 7.0 07/10/2018 1019   PROT 7.3 12/14/2014 1037   ALBUMIN 3.4 (L) 07/10/2018 1019   ALBUMIN 3.8 12/14/2014 1037   AST 25 07/10/2018 1019   AST 20 12/14/2014 1037   ALT 17 07/10/2018 1019   ALT 11 (L) 12/14/2014 1037   ALKPHOS 98 07/10/2018 1019   ALKPHOS 138 (H) 12/14/2014 1037   BILITOT 0.4 07/10/2018 1019   BILITOT 0.5 12/14/2014 1037   GFRNONAA 52 (L) 07/10/2018 1019   GFRNONAA 59 (L) 12/14/2014 1037   GFRAA >60 07/10/2018 1019   GFRAA >60 12/14/2014 1037    No results found for: SPEP, UPEP  Lab Results  Component Value Date   WBC 6.0 07/10/2018   NEUTROABS 3.2 07/10/2018   HGB 8.3 (L) 07/10/2018   HCT 26.7 (L) 07/10/2018   MCV 100.4 (H) 07/10/2018   PLT 82 (L) 07/10/2018      Chemistry      Component Value Date/Time   NA 137 07/10/2018 1019   NA 140 12/14/2014 1037   K 3.8 07/10/2018 1019   K 4.4 12/14/2014 1037   CL 105 07/10/2018 1019   CL 106 12/14/2014 1037   CO2 24 07/10/2018 1019  CO2 26 12/14/2014 1037   BUN 10 07/10/2018 1019   BUN 12 12/14/2014 1037   CREATININE 1.03 (H) 07/10/2018 1019    CREATININE 0.97 12/14/2014 1037   GLU 84 03/16/2014      Component Value Date/Time   CALCIUM 9.0 07/10/2018 1019   CALCIUM 9.2 12/14/2014 1037   ALKPHOS 98 07/10/2018 1019   ALKPHOS 138 (H) 12/14/2014 1037   AST 25 07/10/2018 1019   AST 20 12/14/2014 1037   ALT 17 07/10/2018 1019   ALT 11 (L) 12/14/2014 1037   BILITOT 0.4 07/10/2018 1019   BILITOT 0.5 12/14/2014 1037       RADIOGRAPHIC STUDIES: I have personally reviewed the radiological images as listed and agreed with the findings in the report. No results found.   ASSESSMENT & PLAN:  Ovarian cancer, unspecified laterality ([place]) # RECURRENT HIGH GRADE SEROUS ovarian cancer; November 18th-CT abdomen pelvis shows progression-peritoneal/serosal; but no ascites.  Ca-125 continues to rise.  #Discussed that would recommend discontinuation of carboplatin gemcitabine-given the progressive disease.  Patient understands treatments are also palliative not curative.  She wishes to proceed with treatments.  #Discussed the option of next line therapy that would include-Taxotere weekly/clinical trial option-including antibody drug conjugate at [facility].  Patient met with gynecology oncology yesterday.  Currently awaiting eligibility for the clinical trial.  Patient understand that she is at higher risk for potential toxicities-given multiple lines of previous therapies.  #If patient is not a candidate for clinical trial-would proceed with Taxotere weekly.  Discussed the potential side effects including but not limited to-increasing fatigue, nausea vomiting, diarrhea, hair loss, sores in the mouth, increase risk of infection and also neuropathy.  Patient will need premedication steroids-prior to each infusion.  # PN- G-1-stable.   #Thrombocytopenia mild 82 secondary to recent chemotherapy.  Monitor now.  #DISPOSITION: # flu shot today # follow up to be decided-Dr.B  Addendum: Discussed with Dr. Theora Gianotti; that patient is likely candidate for  clinical trial however since the trial is undergoing amendments-trial would not open the patient for the next few weeks.  For now the recommendation is to proceed with docetaxel chemotherapy.  Will inform patient.  # 40 minutes face-to-face with the patient discussing the above plan of care; more than 50% of time spent on prognosis/ natural history; counseling and coordination.    No orders of the defined types were placed in this encounter.  All questions were answered. The patient knows to call the clinic with any problems, questions or concerns.      Cammie Sickle, MD 07/10/2018 7:33 PM

## 2018-07-11 ENCOUNTER — Telehealth: Payer: Self-pay | Admitting: Internal Medicine

## 2018-07-11 DIAGNOSIS — C569 Malignant neoplasm of unspecified ovary: Secondary | ICD-10-CM

## 2018-07-11 LAB — CULTURE, BLOOD (ROUTINE X 2)
CULTURE: NO GROWTH
CULTURE: NO GROWTH

## 2018-07-11 LAB — CA 125: CANCER ANTIGEN (CA) 125: 540 U/mL — AB (ref 0.0–38.1)

## 2018-07-11 NOTE — Addendum Note (Signed)
Addended by: Sandria Bales B on: 07/11/2018 03:30 PM   Modules accepted: Orders

## 2018-07-11 NOTE — Telephone Encounter (Signed)
Discussed with patient re: my discussion with Dr. Theora Gianotti clinical trial at [facility] awaiting amendments.  For now recommend starting Taxotere weekly.   #Please have the patient follow-up the week after Thanksgiving:  MD/labs- CBC CMP ca125/Taxotere weekly chemotherapy [new].  # please schedule and confirm with patient.  Thanks  Dr.B

## 2018-07-12 ENCOUNTER — Other Ambulatory Visit: Payer: Self-pay | Admitting: Family Medicine

## 2018-07-12 DIAGNOSIS — K219 Gastro-esophageal reflux disease without esophagitis: Secondary | ICD-10-CM

## 2018-07-14 ENCOUNTER — Other Ambulatory Visit: Payer: Self-pay

## 2018-07-14 NOTE — Patient Outreach (Signed)
[facility]) Care Management  07/14/2018  Jamie Conner 07-20-1941 [id number]  EMMI: General discharge red alert Referral date: 07/14/18 Referral reason: lost interest in things: yes Insurance: HTA Day # 4  Telephone call to patient regarding EMMI general discharge red alert. HIPAA verified with patient. Explained reason for call. Patient states she has not lost interest in things. Patient states she has been doing ok since she has been home. Patient states she follow up with her oncologist on 07/08/18. Patient states she has transportation to her appointment.   Patient denies having any fever since discharge from the hospital. Patient states she continues to take her medication as prescribed and has close follow up with her oncologist.  Patient denies any further needs or concerns at this time.  RNCM advised patient to notify MD of any changes in condition prior to scheduled appointment. RNCM provided contact name and number: 24 hour nurse advise line [phone].  RNCM verified patient aware of 911 services for urgent/ emergent needs.   PLAN: 'RNCM will close patient due to patient being assessed and having no further needs.   Quinn Plowman RN,BSN,CCM [facility] Telephonic  [phone]

## 2018-07-21 ENCOUNTER — Other Ambulatory Visit (HOSPITAL_COMMUNITY)
Admission: RE | Admit: 2018-07-21 | Discharge: 2018-07-21 | Disposition: A | Discharge status: Home / Self Care | Payer: PPO | Source: Ambulatory Visit | Attending: Physician Assistant | Admitting: Physician Assistant

## 2018-07-21 ENCOUNTER — Ambulatory Visit (INDEPENDENT_AMBULATORY_CARE_PROVIDER_SITE_OTHER): Payer: PPO | Admitting: Physician Assistant

## 2018-07-21 ENCOUNTER — Encounter: Payer: Self-pay | Admitting: Physician Assistant

## 2018-07-21 VITALS — BP 122/74 | HR 65 | Temp 97.7°F | Wt 191.6 lb

## 2018-07-21 DIAGNOSIS — R3989 Other symptoms and signs involving the genitourinary system: Secondary | ICD-10-CM | POA: Diagnosis not present

## 2018-07-21 DIAGNOSIS — B379 Candidiasis, unspecified: Secondary | ICD-10-CM | POA: Diagnosis not present

## 2018-07-21 DIAGNOSIS — N898 Other specified noninflammatory disorders of vagina: Secondary | ICD-10-CM

## 2018-07-21 LAB — POCT URINALYSIS DIPSTICK
Bilirubin, UA: NEGATIVE
Blood, UA: NEGATIVE
GLUCOSE UA: NEGATIVE
KETONES UA: NEGATIVE
Leukocytes, UA: NEGATIVE
Nitrite, UA: NEGATIVE
Protein, UA: NEGATIVE
SPEC GRAV UA: 1.01 (ref 1.010–1.025)
UROBILINOGEN UA: 0.2 U/dL
pH, UA: 6 (ref 5.0–8.0)

## 2018-07-21 MED ORDER — FLUCONAZOLE 150 MG PO TABS: 150.0000 mg | ORAL_TABLET | Freq: Once | ORAL | 0 refills | Status: AC

## 2018-07-21 NOTE — Progress Notes (Signed)
Acute Office Visit  Subjective:    Patient ID: Jamie Conner, female    DOB: [birth year], 77 y.o.   MRN: [id number]  Chief Complaint  Patient presents with  . Urinary Tract Infection   HPI Patient is in today for Urinary Tract Infection: Patient complains of dysuria and bilateral flank pain She has had symptoms for a few days. Patient also complains of back pain. Patient denies vaginal discharge. Patient does not have a history of recurrent UTI.  Patient does not have a history of pyelonephritis.   Patient has had recent antibiotic use. She was given 3 IV antibiotics while she was in the hospital and then sent home with augmentin.   Past Medical History:  Diagnosis Date  . Atrial fibrillation ([place])   . Breast cancer ([place]) 1998  . Cancer of breast ([place]) 1998   Left/radiation  . CINV (chemotherapy-induced nausea and vomiting)   . GERD (gastroesophageal reflux disease)   . Hyperlipidemia   . Hypothyroidism   . Mitral insufficiency   . Ovarian cancer ([place])    s/p BSO optimal tumor debulking May 2016/chemo  . Personal history of chemotherapy since 2016   ovarian cancer  . Personal history of radiation therapy 1998  . PVC (premature ventricular contraction)     Past Surgical History:  Procedure Laterality Date  . ABDOMINAL HYSTERECTOMY    . BILATERAL SALPINGOOPHORECTOMY  May 2016   with optimal tumor debulking   . BREAST LUMPECTOMY Left 1998  . BREAST SURGERY     Left  . CESAREAN SECTION     x2  . CHOLECYSTECTOMY    . COLONOSCOPY  2006  . DEBULKING N/A 12/22/2014   Procedure: DEBULKING;  Surgeon: Gillis Ends, MD;  Location: [facility] ORS;  Service: Gynecology;  Laterality: N/A;  . LAPAROTOMY N/A 12/22/2014   Procedure: EXPLORATORY LAPAROTOMY;  Surgeon: Robert Bellow, MD;  Location: [facility] ORS;  Service: General;  Laterality: N/A;  . LAPAROTOMY WITH STAGING N/A 12/22/2014   Procedure: LAPAROTOMY WITH STAGING;  Surgeon: Gillis Ends, MD;  Location: [facility]  ORS;  Service: Gynecology;  Laterality: N/A;  . LEFT HEART CATH AND CORONARY ANGIOGRAPHY N/A 12/10/2017   Procedure: LEFT HEART CATH AND CORONARY ANGIOGRAPHY;  Surgeon: Yolonda Kida, MD;  Location: [facility];  Service: Cardiovascular;  Laterality: N/A;  . PERIPHERAL VASCULAR CATHETERIZATION Left 12/22/2014   Procedure: PORTA CATH INSERTION;  Surgeon: Robert Bellow, MD;  Location: [facility] ORS;  Service: General;  Laterality: Left;  . PORTACATH PLACEMENT Right 12/22/2014   Procedure: INSERTION PORT-A-CATH;  Surgeon: Robert Bellow, MD;  Location: [facility] ORS;  Service: General;  Laterality: Right;  . SALPINGOOPHORECTOMY Bilateral 12/22/2014   Procedure: SALPINGO OOPHORECTOMY;  Surgeon: Gillis Ends, MD;  Location: [facility] ORS;  Service: Gynecology;  Laterality: Bilateral;  . UPPER GI ENDOSCOPY  09/25/04   hiatus hernia, schatzki ring and a single gastric polyp    Family History  Problem Relation Age of Onset  . Cancer Mother        breast, throat, and stomach  . Breast cancer Mother   . Heart disease Father   . Pulmonary embolism Sister   . Cancer Brother 60       angiosarcoma of the chest; carcinoid small intestinal tumor  . Hypertension Brother   . Stroke Brother   . Cancer Maternal Aunt        breast cancer  . Cancer Maternal Grandfather 67  pancreatic    Social History   Socioeconomic History  . Marital status: Married    Spouse name: Not on file  . Number of children: 2  . Years of education: Not on file  . Highest education level: Bachelor's degree (e.g., BA, AB, BS)  Occupational History  . Occupation: retired  Scientific laboratory technician  . Financial resource strain: Not hard at all  . Food insecurity:    Worry: Never true    Inability: Never true  . Transportation needs:    Medical: No    Non-medical: No  Tobacco Use  . Smoking status: Never Smoker  . Smokeless tobacco: Never Used  Substance and Sexual Activity  . Alcohol use: No  . Drug use: No  .  Sexual activity: Not Currently  Lifestyle  . Physical activity:    Days per week: Not on file    Minutes per session: Not on file  . Stress: To some extent  Relationships  . Social connections:    Talks on phone: Not on file    Gets together: Not on file    Attends religious service: Not on file    Active member of club or organization: Not on file    Attends meetings of clubs or organizations: Not on file    Relationship status: Not on file  . Intimate partner violence:    Fear of current or ex partner: Not on file    Emotionally abused: Not on file    Physically abused: Not on file    Forced sexual activity: Not on file  Other Topics Concern  . Not on file  Social History Narrative  . Not on file    Outpatient Medications Prior to Visit  Medication Sig Dispense Refill  . acetaminophen (TYLENOL) 500 MG tablet Take 500 mg by mouth every 6 (six) hours as needed.    Marland Kitchen alum & mag hydroxide-simeth (MAALOX/MYLANTA) 200-200-20 MG/5ML suspension Take 15 mLs by mouth every 6 (six) hours as needed for indigestion or heartburn. 355 mL 0  . aspirin EC 325 MG tablet Take 1 tablet (325 mg total) by mouth daily. 100 tablet 3  . chlorhexidine (PERIDEX) 0.12 % solution     . Cholecalciferol (VITAMIN D) 2000 units tablet Take 1 tablet (2,000 Units total) by mouth daily. 30 tablet 12  . Docosanol (ABREVA EX) Apply 1 application topically as needed. Fever blisters    . fluticasone (FLONASE) 50 MCG/ACT nasal spray Place 1 spray into both nostrils daily as needed.     . furosemide (LASIX) 20 MG tablet Take 0.5 tablets (10 mg total) by mouth daily. 30 tablet 0  . lactulose (CHRONULAC) 10 GM/15ML solution Take 15 mLs (10 g total) by mouth daily as needed for moderate constipation. 236 mL 0  . levothyroxine (SYNTHROID, LEVOTHROID) 50 MCG tablet TAKE ONE TABLET ON AN EMPTY STOMACH WITHA GLASS OF WATER AT LEAST 30 TO 60 MINUTES BEFORE BREAKFAST 30 tablet 12  . lidocaine-prilocaine (EMLA) cream Apply to  port area 30-45 mins prior to chemo. 30 g 5  . loratadine (CLARITIN) 10 MG tablet Take 10 mg by mouth daily as needed.     Marland Kitchen LORazepam (ATIVAN) 0.5 MG tablet TAKE ONE TABLET BY MOUTH AT BEDTIME AS NEEDED FOR ANXIETY/SLEEP 30 tablet 0  . Lysine 500 MG TABS Take 1 tablet by mouth daily as needed.     . metoprolol succinate (TOPROL-XL) 25 MG 24 hr tablet Take 1 tablet by mouth daily.    Marland Kitchen  Multiple Vitamin (MULTIVITAMIN) capsule Take 1 capsule by mouth daily.    . ondansetron (ZOFRAN) 4 MG tablet One pill 30-60 mins prior to taking zejula to prevent nausea/vomitting. 45 tablet 3  . ondansetron (ZOFRAN) 8 MG tablet Take 1 tablet (8 mg total) by mouth every 8 (eight) hours as needed for nausea or vomiting. 20 tablet 4  . pantoprazole (PROTONIX) 40 MG tablet TAKE ONE TABLET EVERY DAY 30 tablet 1  . polyethylene glycol (MIRALAX / GLYCOLAX) packet Take 17 g by mouth daily as needed.    . prochlorperazine (COMPAZINE) 10 MG tablet Take 1 tablet (10 mg total) by mouth every 6 (six) hours as needed for nausea or vomiting. 30 tablet 4   No facility-administered medications prior to visit.     Allergies  Allergen Reactions  . Carafate [Sucralfate] Rash  . Sulfa Antibiotics Rash    Review of Systems  Constitutional: Negative.   HENT: Negative.   Cardiovascular: Negative.   Gastrointestinal: Positive for abdominal pain.  Genitourinary: Positive for dysuria and urgency.  Endo/Heme/Allergies: Negative.        Objective:    Physical Exam  Constitutional: She is oriented to person, place, and time. She appears well-developed and well-nourished. No distress.  Cardiovascular: Normal rate, regular rhythm and normal heart sounds. Exam reveals no gallop and no friction rub.  No murmur heard. Pulmonary/Chest: Effort normal and breath sounds normal. No respiratory distress. She has no wheezes. She has no rales.  Abdominal: Soft. Normal appearance and bowel sounds are normal. She exhibits no distension and  no mass. There is no hepatosplenomegaly. There is tenderness in the suprapubic area. There is no rebound, no guarding and no CVA tenderness.  Genitourinary:    There is no rash, tenderness, lesion or injury on the right labia. There is no rash, tenderness, lesion or injury on the left labia. There is erythema and tenderness in the vagina. No vaginal discharge found.  Neurological: She is alert and oriented to person, place, and time.  Skin: Skin is warm and dry. She is not diaphoretic.  Vitals reviewed.   BP 122/74 (BP Location: Left Arm, Patient Position: Sitting, Cuff Size: Normal)   Pulse 65   Temp 97.7 F (36.5 C) (Oral)   Wt 191 lb 9.6 oz (86.9 kg)   SpO2 98%   BMI 32.89 kg/m  Wt Readings from Last 3 Encounters:  07/21/18 191 lb 9.6 oz (86.9 kg)  07/10/18 191 lb (86.6 kg)  07/09/18 191 lb 6 oz (86.8 kg)    There are no preventive care reminders to display for this patient.  There are no preventive care reminders to display for this patient.   Lab Results  Component Value Date   TSH 4.330 11/25/2017   Lab Results  Component Value Date   WBC 6.0 07/10/2018   HGB 8.3 (L) 07/10/2018   HCT 26.7 (L) 07/10/2018   MCV 100.4 (H) 07/10/2018   PLT 82 (L) 07/10/2018   Lab Results  Component Value Date   NA 137 07/10/2018   K 3.8 07/10/2018   CO2 24 07/10/2018   GLUCOSE 100 (H) 07/10/2018   BUN 10 07/10/2018   CREATININE 1.03 (H) 07/10/2018   BILITOT 0.4 07/10/2018   ALKPHOS 98 07/10/2018   AST 25 07/10/2018   ALT 17 07/10/2018   PROT 7.0 07/10/2018   ALBUMIN 3.4 (L) 07/10/2018   CALCIUM 9.0 07/10/2018   ANIONGAP 8 07/10/2018   Lab Results  Component Value Date   CHOL 296 (  H) 12/10/2017   Lab Results  Component Value Date   HDL 63 12/10/2017   Lab Results  Component Value Date   LDLCALC 202 (H) 12/10/2017   Lab Results  Component Value Date   TRIG 156 (H) 12/10/2017   Lab Results  Component Value Date   CHOLHDL 4.7 12/10/2017   No results found  for: HGBA1C     Assessment & Plan:   UA normal today. Will send for culture to make sure no bacteria present.   Swab collected today vaginally. Suspect yeast infection due to recent antibiotic use, but will check for BV as well. Diflucan sent in. Call if symptoms fail to improve.   Problem List Items Addressed This Visit    None    Visit Diagnoses    Possible urinary tract infection    -  Primary   Relevant Orders   POCT urinalysis dipstick (Completed)   Urine Culture   Yeast infection       Relevant Medications   fluconazole (DIFLUCAN) 150 MG tablet   Other Relevant Orders   Cervicovaginal ancillary only   Vaginal discharge       Relevant Orders   Cervicovaginal ancillary only       Meds ordered this encounter  Medications  . fluconazole (DIFLUCAN) 150 MG tablet    Sig: Take 1 tablet (150 mg total) by mouth once for 1 dose. May repeat in 48-72 hours if needed    Dispense:  2 tablet    Refill:  0    Order Specific Question:   Supervising Provider    Answer:   Jerrol Banana [[id number]]   The entirety of the information documented in the History of Present Illness, Review of Systems and Physical Exam were personally obtained by me. Portions of this information were initially documented by Danna Hefty, CMA and reviewed by me for thoroughness and accuracy.  Mar Daring, PA-C

## 2018-07-23 ENCOUNTER — Telehealth: Payer: Self-pay | Admitting: *Deleted

## 2018-07-23 ENCOUNTER — Telehealth: Payer: Self-pay

## 2018-07-23 LAB — CERVICOVAGINAL ANCILLARY ONLY
BACTERIAL VAGINITIS: NEGATIVE
Candida vaginitis: NEGATIVE

## 2018-07-23 LAB — URINE CULTURE

## 2018-07-23 NOTE — Telephone Encounter (Signed)
Patient called and reports that she has been on antibiotics for an infection and now has yeast because of it. Her PCP has given her Fluconazole to take 2 doses and she is asking if this is going to interfere with her planned chemotherapy treatment this week. Please advise

## 2018-07-23 NOTE — Telephone Encounter (Signed)
Patient informed per Dr B that this will not be a problem. Left message on voice mail

## 2018-07-23 NOTE — Telephone Encounter (Signed)
Patient was advised and advise to stop taking the Fluconazole since symptoms has improved.

## 2018-07-23 NOTE — Telephone Encounter (Signed)
Patient advised as below.  

## 2018-07-23 NOTE — Telephone Encounter (Signed)
-----   Message from Mar Daring, PA-C sent at 07/23/2018  4:26 PM EST ----- Swab negative for BV and yeast.

## 2018-07-23 NOTE — Telephone Encounter (Signed)
-----   Message from Mar Daring, [place] sent at 07/23/2018  5:18 PM EST ----- Urine culture was negative for bacteria

## 2018-07-25 ENCOUNTER — Other Ambulatory Visit: Payer: Self-pay | Admitting: *Deleted

## 2018-07-25 ENCOUNTER — Other Ambulatory Visit: Payer: Self-pay

## 2018-07-25 ENCOUNTER — Telehealth: Payer: Self-pay | Admitting: Internal Medicine

## 2018-07-25 ENCOUNTER — Inpatient Hospital Stay: Payer: PPO | Attending: Internal Medicine

## 2018-07-25 ENCOUNTER — Inpatient Hospital Stay: Payer: PPO

## 2018-07-25 ENCOUNTER — Inpatient Hospital Stay (HOSPITAL_BASED_OUTPATIENT_CLINIC_OR_DEPARTMENT_OTHER): Payer: PPO | Admitting: Internal Medicine

## 2018-07-25 VITALS — BP 136/70 | HR 62 | Resp 18

## 2018-07-25 VITALS — BP 136/77 | HR 64 | Temp 97.9°F | Resp 16 | Wt 189.2 lb

## 2018-07-25 DIAGNOSIS — Z79899 Other long term (current) drug therapy: Secondary | ICD-10-CM | POA: Insufficient documentation

## 2018-07-25 DIAGNOSIS — R6889 Other general symptoms and signs: Secondary | ICD-10-CM

## 2018-07-25 DIAGNOSIS — D696 Thrombocytopenia, unspecified: Secondary | ICD-10-CM

## 2018-07-25 DIAGNOSIS — R5383 Other fatigue: Secondary | ICD-10-CM | POA: Diagnosis not present

## 2018-07-25 DIAGNOSIS — I10 Essential (primary) hypertension: Secondary | ICD-10-CM | POA: Insufficient documentation

## 2018-07-25 DIAGNOSIS — C569 Malignant neoplasm of unspecified ovary: Secondary | ICD-10-CM

## 2018-07-25 DIAGNOSIS — E785 Hyperlipidemia, unspecified: Secondary | ICD-10-CM | POA: Insufficient documentation

## 2018-07-25 DIAGNOSIS — M6281 Muscle weakness (generalized): Secondary | ICD-10-CM | POA: Diagnosis not present

## 2018-07-25 DIAGNOSIS — D649 Anemia, unspecified: Secondary | ICD-10-CM

## 2018-07-25 DIAGNOSIS — M5137 Other intervertebral disc degeneration, lumbosacral region: Secondary | ICD-10-CM

## 2018-07-25 DIAGNOSIS — Z5111 Encounter for antineoplastic chemotherapy: Secondary | ICD-10-CM | POA: Diagnosis not present

## 2018-07-25 DIAGNOSIS — K5900 Constipation, unspecified: Secondary | ICD-10-CM | POA: Diagnosis not present

## 2018-07-25 DIAGNOSIS — R600 Localized edema: Secondary | ICD-10-CM | POA: Diagnosis not present

## 2018-07-25 DIAGNOSIS — C786 Secondary malignant neoplasm of retroperitoneum and peritoneum: Secondary | ICD-10-CM | POA: Insufficient documentation

## 2018-07-25 DIAGNOSIS — I2510 Atherosclerotic heart disease of native coronary artery without angina pectoris: Secondary | ICD-10-CM | POA: Diagnosis not present

## 2018-07-25 DIAGNOSIS — Z515 Encounter for palliative care: Secondary | ICD-10-CM | POA: Diagnosis not present

## 2018-07-25 DIAGNOSIS — T451X5S Adverse effect of antineoplastic and immunosuppressive drugs, sequela: Secondary | ICD-10-CM | POA: Diagnosis not present

## 2018-07-25 DIAGNOSIS — E039 Hypothyroidism, unspecified: Secondary | ICD-10-CM | POA: Diagnosis not present

## 2018-07-25 DIAGNOSIS — Z90710 Acquired absence of both cervix and uterus: Secondary | ICD-10-CM | POA: Insufficient documentation

## 2018-07-25 DIAGNOSIS — R531 Weakness: Secondary | ICD-10-CM | POA: Diagnosis not present

## 2018-07-25 DIAGNOSIS — Z90722 Acquired absence of ovaries, bilateral: Secondary | ICD-10-CM | POA: Diagnosis not present

## 2018-07-25 DIAGNOSIS — G893 Neoplasm related pain (acute) (chronic): Secondary | ICD-10-CM | POA: Diagnosis not present

## 2018-07-25 DIAGNOSIS — K219 Gastro-esophageal reflux disease without esophagitis: Secondary | ICD-10-CM | POA: Insufficient documentation

## 2018-07-25 DIAGNOSIS — R530 Neoplastic (malignant) related fatigue: Secondary | ICD-10-CM | POA: Insufficient documentation

## 2018-07-25 DIAGNOSIS — R55 Syncope and collapse: Secondary | ICD-10-CM | POA: Diagnosis not present

## 2018-07-25 DIAGNOSIS — G629 Polyneuropathy, unspecified: Secondary | ICD-10-CM | POA: Insufficient documentation

## 2018-07-25 DIAGNOSIS — I252 Old myocardial infarction: Secondary | ICD-10-CM | POA: Diagnosis not present

## 2018-07-25 DIAGNOSIS — D6481 Anemia due to antineoplastic chemotherapy: Secondary | ICD-10-CM | POA: Insufficient documentation

## 2018-07-25 DIAGNOSIS — Z7982 Long term (current) use of aspirin: Secondary | ICD-10-CM | POA: Insufficient documentation

## 2018-07-25 DIAGNOSIS — Z7189 Other specified counseling: Secondary | ICD-10-CM

## 2018-07-25 DIAGNOSIS — R5381 Other malaise: Secondary | ICD-10-CM | POA: Diagnosis not present

## 2018-07-25 DIAGNOSIS — Z853 Personal history of malignant neoplasm of breast: Secondary | ICD-10-CM | POA: Insufficient documentation

## 2018-07-25 DIAGNOSIS — I480 Paroxysmal atrial fibrillation: Secondary | ICD-10-CM | POA: Insufficient documentation

## 2018-07-25 LAB — CBC WITH DIFFERENTIAL/PLATELET
Abs Immature Granulocytes: 0.01 10*3/uL (ref 0.00–0.07)
Basophils Absolute: 0 10*3/uL (ref 0.0–0.1)
Basophils Relative: 0 %
Eosinophils Absolute: 0 10*3/uL (ref 0.0–0.5)
Eosinophils Relative: 1 %
HCT: 24.4 % — ABNORMAL LOW (ref 36.0–46.0)
Hemoglobin: 7.4 g/dL — ABNORMAL LOW (ref 12.0–15.0)
IMMATURE GRANULOCYTES: 0 %
Lymphocytes Relative: 42 %
Lymphs Abs: 1.9 10*3/uL (ref 0.7–4.0)
MCH: 29.5 pg (ref 26.0–34.0)
MCHC: 30.3 g/dL (ref 30.0–36.0)
MCV: 97.2 fL (ref 80.0–100.0)
Monocytes Absolute: 0.7 10*3/uL (ref 0.1–1.0)
Monocytes Relative: 15 %
Neutro Abs: 1.9 10*3/uL (ref 1.7–7.7)
Neutrophils Relative %: 42 %
Platelets: 281 10*3/uL (ref 150–400)
RBC: 2.51 MIL/uL — ABNORMAL LOW (ref 3.87–5.11)
RDW: 18.6 % — ABNORMAL HIGH (ref 11.5–15.5)
WBC: 4.6 10*3/uL (ref 4.0–10.5)
nRBC: 0 % (ref 0.0–0.2)

## 2018-07-25 LAB — LACTATE DEHYDROGENASE: LDH: 175 U/L (ref 98–192)

## 2018-07-25 LAB — COMPREHENSIVE METABOLIC PANEL
ALBUMIN: 3.4 g/dL — AB (ref 3.5–5.0)
ALT: 11 U/L (ref 0–44)
AST: 21 U/L (ref 15–41)
Alkaline Phosphatase: 78 U/L (ref 38–126)
Anion gap: 9 (ref 5–15)
BUN: 20 mg/dL (ref 8–23)
CO2: 23 mmol/L (ref 22–32)
Calcium: 8.9 mg/dL (ref 8.9–10.3)
Chloride: 107 mmol/L (ref 98–111)
Creatinine, Ser: 1.09 mg/dL — ABNORMAL HIGH (ref 0.44–1.00)
GFR calc Af Amer: 58 mL/min — ABNORMAL LOW (ref >60)
GFR calc non Af Amer: 50 mL/min — ABNORMAL LOW (ref >60)
GLUCOSE: 107 mg/dL — AB (ref 70–99)
Potassium: 4.1 mmol/L (ref 3.5–5.1)
SODIUM: 139 mmol/L (ref 135–145)
Total Bilirubin: 0.5 mg/dL (ref 0.3–1.2)
Total Protein: 7.1 g/dL (ref 6.5–8.1)

## 2018-07-25 MED ORDER — SODIUM CHLORIDE 0.9 % IV SOLN
Freq: Once | INTRAVENOUS | Status: AC
  Administered 2018-07-25: 12:00:00 via INTRAVENOUS
  Filled 2018-07-25: qty 250

## 2018-07-25 MED ORDER — HEPARIN SOD (PORK) LOCK FLUSH 100 UNIT/ML IV SOLN
500.0000 [IU] | Freq: Once | INTRAVENOUS | Status: DC
  Filled 2018-07-25: qty 5

## 2018-07-25 MED ORDER — HEPARIN SOD (PORK) LOCK FLUSH 100 UNIT/ML IV SOLN
500.0000 [IU] | Freq: Once | INTRAVENOUS | Status: AC | PRN
  Administered 2018-07-25: 500 [IU]
  Filled 2018-07-25: qty 5

## 2018-07-25 MED ORDER — ONDANSETRON HCL 4 MG/2ML IJ SOLN
8.0000 mg | Freq: Once | INTRAMUSCULAR | Status: AC
  Administered 2018-07-25: 8 mg via INTRAVENOUS
  Filled 2018-07-25: qty 4

## 2018-07-25 MED ORDER — SODIUM CHLORIDE 0.9 % IV SOLN
25.0000 mg/m2 | Freq: Once | INTRAVENOUS | Status: AC
  Administered 2018-07-25: 50 mg via INTRAVENOUS
  Filled 2018-07-25: qty 5

## 2018-07-25 MED ORDER — SODIUM CHLORIDE 0.9% FLUSH
10.0000 mL | Freq: Once | INTRAVENOUS | Status: AC
  Administered 2018-07-25: 10 mL via INTRAVENOUS
  Filled 2018-07-25: qty 10

## 2018-07-25 MED ORDER — SODIUM CHLORIDE 0.9 % IV SOLN: 10.0000 mg | Freq: Once | INTRAVENOUS | Status: DC

## 2018-07-25 MED ORDER — DEXAMETHASONE SODIUM PHOSPHATE 10 MG/ML IJ SOLN
10.0000 mg | Freq: Once | INTRAMUSCULAR | Status: AC
  Administered 2018-07-25: 10 mg via INTRAVENOUS
  Filled 2018-07-25: qty 1

## 2018-07-25 NOTE — Assessment & Plan Note (Addendum)
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  Ca-125 continues to rise.  Patient eligible for clinical trial at [facility]; however while undergoing amendments.  We will proceed with Taxotere weekly.  Discussed with Dr. Theora Gianotti.  #Proceed with weekly Taxotere 25 mg/m 3 weeks on 1 week off. Labs today reviewed;  acceptable for treatment today-except anemia 7.4 hemoglobin [see discussion below]  Discussed the potential side effects including but not limited to-increasing fatigue, nausea vomiting, diarrhea, hair loss, sores in the mouth, increase risk of infection and also neuropathy.  Increased lacrimation.  # Severe Anemia- Hb 7.4; ? Sec to [employer] 11/06]; check LDH.  Plan blood transfusion early next week.  If this continues to be an issue then would recommend EPO agent.  # PN- G-1-stable.   # Thrombocytopenia-platelets 280 improved.  Monitor closely.  #Patient also concerned about her quality of life on chemotherapy/continue treatments.  Discussed regarding palliative care evaluation patient is interested.  # DISPOSITION: # Treatment today;  # HOLD tube today; PRBC transfusion on 12/09 or 12/10. # referral to [facility] in 1 week # follow up in 1 week-MD-labs-cbc/cmp/holdtube-Taxoetere-Dr.B

## 2018-07-25 NOTE — Progress Notes (Signed)
[place] OFFICE PROGRESS NOTE  Patient Care Team: Jerrol Banana., MD as PCP - General (Family Medicine) Gillis Ends, MD as Referring Physician (Obstetrics and Gynecology) Cammie Sickle, MD as Consulting Physician (Oncology) Corey Skains, MD as Consulting Physician (Cardiology) Marval Regal, NP as Nurse Practitioner (Nurse Practitioner) Manya Silvas, MD as Consulting Physician (Gastroenterology)  Cancer Staging Malignant neoplasm of ovary [facility]) Staging form: Ovary, AJCC 7th Edition - Clinical: Stage IIIC (T3c, N0, M0) - Unsigned    Oncology History   # April- MAY 2016- HIGH GRADE SEROUS OVARIAN CANCER STAGE IIIC; CA-125 +2300+;  s/p OPTIMAL DEBULKING SURGERY   # MAY 2016[name] DD [finished in Sep 19th 2017]  # MAY CT 2017- RECURRENT/Peritoneal carcinomatosis/pelvic implant ~1.2cm/Ca-125-34;   # July 3rd 2017- CARBO-DOXIL q 4W [with neulasta]; CT May 11 2016- slight increase peritoneal / stable nodules. Cont chemo [finished DEC 2017]. Last dose CARBO-07/11/2016; DEC 28th 2017 CT- Increase in peritoneal deposits; increasing Ca 125 [60s]- PLATINUM REFRACTORY  # Jan 2018- Taxol-Avastin x6 cycles; Aug 2018- CR; Aug 2018- start Avastin Maintenance; MARCH 2019- PROGRESSION- STOP avastin.   # MID April 2019- Zejula [300 mg/day]; 4 days- later STOPPED sec to Chest pain;  # June 1st week- Lynparza; AUG 2019- Progression of disease  # AUG, 21st 2019- START CARBO-GEM x4 cycles; NOV, 19th CT Progression/rising Ca-125.   # DEC 1st week- Taxotere weekly.    # May 2019- TAKASUBO ? 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  ---------------------------------------------------------------------    DIAGNOSIS: MAY 2017; RECURRENT OVARIAN CA-high-grade serous/platinum refractory  STAGE: IV    ;GOALS: PALLIATIVE  CURRENT/MOST RECENT THERAPY: Docetaxel      Malignant neoplasm of ovary ([place])   04/08/2018 - 07/08/2018 Chemotherapy    The patient had palonosetron (ALOXI) injection 0.25 mg, 0.25 mg, Intravenous,  Once, 4 of 4 cycles Administration: 0.25 mg (04/09/2018), 0.25 mg (04/30/2018), 0.25 mg (06/04/2018), 0.25 mg (06/25/2018) pegfilgrastim-cbqv (UDENYCA) injection 6 mg, 6 mg, Subcutaneous, Once, 3 of 3 cycles Administration: 6 mg (05/01/2018), 6 mg (06/05/2018), 6 mg (06/26/2018) CARBOplatin (PARAPLATIN) 430 mg in sodium chloride 0.9 % 250 mL chemo infusion, 430 mg (100 % of original dose 430.5 mg), Intravenous,  Once, 4 of 4 cycles Dose modification:   (original dose 430.5 mg, Cycle 1) Administration: 430 mg (04/09/2018), 430 mg (04/30/2018), 430 mg (06/04/2018), 430 mg (06/25/2018) gemcitabine (GEMZAR) 2,000 mg in sodium chloride 0.9 % 250 mL chemo infusion, 1,976 mg, Intravenous,  Once, 4 of 4 cycles Administration: 2,000 mg (04/09/2018), 2,000 mg (04/30/2018), 2,000 mg (06/04/2018), 2,000 mg (06/25/2018)  for chemotherapy treatment.      Ovarian cancer, unspecified laterality ([place])   07/09/2018 -  Chemotherapy    The patient had DOCEtaxel (TAXOTERE) 50 mg in sodium chloride 0.9 % 150 mL chemo infusion, 25 mg/m2 = 50 mg, Intravenous,  Once, 1 of 4 cycles Administration: 50 mg (07/25/2018)  for chemotherapy treatment.        INTERVAL HISTORY:  Jamie Conner 77 y.o.  female pleasant patient above history of high-grade serous ovarian cancer-platinum refractory is here to proceed with weekly Taxotere.  Patient was recently evaluated by gynecology  oncology [facility]-is eligible for antibody drug conjugate clinical trial; however it might be few more weeks before patient could be enrolled  as a trial is undergoing amendments.   Patient complains of mild to moderate fatigue.  Intermittent constipation.  No blood in stools no black or stools.  Mild tingling and numbness.  Review of Systems  Constitutional: Positive for malaise/fatigue. Negative for chills, diaphoresis, fever and weight loss.  HENT: Negative for nosebleeds and sore throat.   Eyes: Negative for double vision.  Respiratory: Negative for cough, hemoptysis, sputum production, shortness of breath and wheezing.   Cardiovascular: Negative for chest pain, palpitations, orthopnea and leg swelling.  Gastrointestinal: Positive for constipation. Negative for abdominal pain, blood in stool, diarrhea, heartburn, melena, nausea and vomiting.  Genitourinary: Negative for dysuria, frequency and urgency.  Musculoskeletal: Negative for back pain and joint pain.  Skin: Negative.  Negative for itching and rash.  Neurological: Negative for dizziness, tingling, focal weakness, weakness and headaches.  Endo/Heme/Allergies: Does not bruise/bleed easily.  Psychiatric/Behavioral: Negative for depression. The patient is not nervous/anxious and does not have insomnia.       PAST MEDICAL HISTORY :  Past Medical History:  Diagnosis Date  . Atrial fibrillation ([place])   . Breast cancer ([place]) 1998  . Cancer of breast ([place]) 1998   Left/radiation  . CINV (chemotherapy-induced nausea and vomiting)   . GERD (gastroesophageal reflux disease)   . Hyperlipidemia   . Hypothyroidism   . Mitral insufficiency   . Ovarian cancer ([place])    s/p BSO optimal tumor debulking May 2016/chemo  . Personal history of chemotherapy since 2016   ovarian cancer  . Personal history of radiation therapy 1998  . PVC (premature ventricular contraction)     PAST SURGICAL HISTORY :   Past Surgical History:  Procedure Laterality Date  . ABDOMINAL HYSTERECTOMY    . BILATERAL SALPINGOOPHORECTOMY  May 2016   with optimal tumor debulking   . BREAST LUMPECTOMY  Left 1998  . BREAST SURGERY     Left  . CESAREAN SECTION     x2  . CHOLECYSTECTOMY    . COLONOSCOPY  2006  . DEBULKING N/A 12/22/2014   Procedure: DEBULKING;  Surgeon: Gillis Ends, MD;  Location: [facility] ORS;  Service: Gynecology;  Laterality: N/A;  . LAPAROTOMY N/A 12/22/2014   Procedure: EXPLORATORY LAPAROTOMY;  Surgeon: Robert Bellow, MD;  Location: [facility] ORS;  Service: General;  Laterality: N/A;  . LAPAROTOMY WITH STAGING N/A 12/22/2014   Procedure: LAPAROTOMY WITH STAGING;  Surgeon: Gillis Ends, MD;  Location: [facility] OR[facility];  Service: Gynecology;  Laterality: N/A;  . LEFT HEART CATH AND CORONARY ANGIOGRAPHY N/A 12/10/2017   Procedure: LEFT HEART CATH AND CORONARY ANGIOGRAPHY;  Surgeon: Yolonda Kida, MD;  Location: [place] [facility];  Service: Cardiovascular;  Laterality: N/A;  . PERIPHERAL VASCULAR CATHETERIZATION Left 12/22/2014   Procedure: PORTA CATH INSERTION;  Surgeon: Robert Bellow, MD;  Location: [facility] OR[facility];  Service: General;  Laterality: Left;  . PORTACATH PLACEMENT Right 12/22/2014   Procedure: INSERTION PORT-A-CATH;  Surgeon: Robert Bellow, MD;  Location: [facility] OR[facility];  Service: General;  Laterality: Right;  . SALPINGOOPHORECTOMY Bilateral 12/22/2014   Procedure: SALPINGO OOPHORECTOMY;  Surgeon: Gillis Ends, MD;  Location: [facility] OR[facility];  Service: Gynecology;  Laterality: Bilateral;  . UPPER GI ENDOSCOPY  09/25/04   hiatus hernia, schatzki ring and a single gastric polyp    FAMILY HISTORY :   Family History  Problem Relation Age of Onset  .  Cancer Mother        breast, throat, and stomach  . Breast cancer Mother   . Heart disease Father   . Pulmonary embolism Sister   . Cancer Brother 60       angiosarcoma of the chest; carcinoid small intestinal tumor  . Hypertension Brother   . Stroke Brother   . Cancer Maternal Aunt        breast cancer  . Cancer Maternal Grandfather 59       pancreatic    SOCIAL HISTORY:   Social History   Tobacco  Use  . Smoking status: Never Smoker  . Smokeless tobacco: Never Used  Substance Use Topics  . Alcohol use: No  . Drug use: No    ALLERGIES:  is allergic to carafate [sucralfate] and sulfa antibiotics.  MEDICATIONS:  Current Outpatient Medications  Medication Sig Dispense Refill  . acetaminophen (TYLENOL) 500 MG tablet Take 500 mg by mouth every 6 (six) hours as needed.    Marland Kitchen alum & mag hydroxide-simeth (MAALOX/MYLANTA) 200-200-20 MG/5ML suspension Take 15 mLs by mouth every 6 (six) hours as needed for indigestion or heartburn. 355 mL 0  . aspirin EC 325 MG tablet Take 1 tablet (325 mg total) by mouth daily. 100 tablet 3  . chlorhexidine (PERIDEX) 0.12 % solution     . Cholecalciferol (VITAMIN D) 2000 units tablet Take 1 tablet (2,000 Units total) by mouth daily. 30 tablet 12  . Docosanol (ABREVA EX) Apply 1 application topically as needed. Fever blisters    . fluticasone (FLONASE) 50 MCG/ACT nasal spray Place 1 spray into both nostrils daily as needed.     . furosemide (LASIX) 20 MG tablet Take 0.5 tablets (10 mg total) by mouth daily. 30 tablet 0  . lactulose (CHRONULAC) 10 GM/15ML solution Take 15 mLs (10 g total) by mouth daily as needed for moderate constipation. 236 mL 0  . levothyroxine (SYNTHROID, LEVOTHROID) 50 MCG tablet TAKE ONE TABLET ON AN EMPTY STOMACH WITHA GLASS OF WATER AT LEAST 30 TO 60 MINUTES BEFORE BREAKFAST 30 tablet 12  . lidocaine-prilocaine (EMLA) cream Apply to port area 30-45 mins prior to chemo. 30 g 5  . loratadine (CLARITIN) 10 MG tablet Take 10 mg by mouth daily as needed.     Marland Kitchen LORazepam (ATIVAN) 0.5 MG tablet TAKE ONE TABLET BY MOUTH AT BEDTIME AS NEEDED FOR ANXIETY/SLEEP 30 tablet 0  . Lysine 500 MG TABS Take 1 tablet by mouth daily as needed.     . metoprolol succinate (TOPROL-XL) 25 MG 24 hr tablet Take 1 tablet by mouth daily.    . Multiple Vitamin (MULTIVITAMIN) capsule Take 1 capsule by mouth daily.    . ondansetron (ZOFRAN) 4 MG tablet One pill 30-60  mins prior to taking zejula to prevent nausea/vomitting. 45 tablet 3  . ondansetron (ZOFRAN) 8 MG tablet Take 1 tablet (8 mg total) by mouth every 8 (eight) hours as needed for nausea or vomiting. 20 tablet 4  . pantoprazole (PROTONIX) 40 MG tablet TAKE ONE TABLET EVERY DAY 30 tablet 1  . polyethylene glycol (MIRALAX / GLYCOLAX) packet Take 17 g by mouth daily as needed.    . prochlorperazine (COMPAZINE) 10 MG tablet Take 1 tablet (10 mg total) by mouth every 6 (six) hours as needed for nausea or vomiting. 30 tablet 4   No current facility-administered medications for this visit.    Facility-Administered Medications Ordered in Other Visits  Medication Dose Route Frequency Provider Last Rate Last Dose  . [  COMPLETED] heparin lock flush 100 unit/mL  500 Units Intracatheter Daily PRN Cammie Sickle, MD   500 Units at 07/28/18 1100    PHYSICAL EXAMINATION: ECOG PERFORMANCE STATUS: 1 - Symptomatic but completely ambulatory  BP 136/77 (BP Location: Left Arm, Patient Position: Sitting)   Pulse 64   Temp 97.9 F (36.6 C) (Tympanic)   Resp 16   Wt 189 lb 3.2 oz (85.8 kg)   BMI 32.48 kg/m   Filed Weights   07/25/18 1017  Weight: 189 lb 3.2 oz (85.8 kg)    Physical Exam  Constitutional: She is oriented to person, place, and time and well-developed, well-nourished, and in no distress.  Accompanied by husband.  She is walking by herself.  HENT:  Head: Normocephalic and atraumatic.  Mouth/Throat: Oropharynx is clear and moist. No oropharyngeal exudate.  Eyes: Pupils are equal, round, and reactive to light.  Neck: Normal range of motion. Neck supple.  Cardiovascular: Normal rate and regular rhythm.  Pulmonary/Chest: No respiratory distress. She has no wheezes.  Abdominal: Soft. Bowel sounds are normal. She exhibits no distension and no mass. There is no tenderness. There is no rebound and no guarding.  Musculoskeletal: Normal range of motion. She exhibits no edema or tenderness.   Neurological: She is alert and oriented to person, place, and time.  Skin: Skin is warm.  Psychiatric: Affect normal.       LABORATORY DATA:  I have reviewed the data as listed    Component Value Date/Time   NA 139 07/25/2018 0938   NA 140 12/14/2014 1037   K 4.1 07/25/2018 0938   K 4.4 12/14/2014 1037   CL 107 07/25/2018 0938   CL 106 12/14/2014 1037   CO2 23 07/25/2018 0938   CO2 26 12/14/2014 1037   GLUCOSE 107 (H) 07/25/2018 0938   GLUCOSE 87 12/14/2014 1037   BUN 20 07/25/2018 0938   BUN 12 12/14/2014 1037   CREATININE 1.09 (H) 07/25/2018 0938   CREATININE 0.97 12/14/2014 1037   CALCIUM 8.9 07/25/2018 0938   CALCIUM 9.2 12/14/2014 1037   PROT 7.1 07/25/2018 0938   PROT 7.3 12/14/2014 1037   ALBUMIN 3.4 (L) 07/25/2018 0938   ALBUMIN 3.8 12/14/2014 1037   AST 21 07/25/2018 0938   AST 20 12/14/2014 1037   ALT 11 07/25/2018 0938   ALT 11 (L) 12/14/2014 1037   ALKPHOS 78 07/25/2018 0938   ALKPHOS 138 (H) 12/14/2014 1037   BILITOT 0.5 07/25/2018 0938   BILITOT 0.5 12/14/2014 1037   GFRNONAA 50 (L) 07/25/2018 0938   GFRNONAA 59 (L) 12/14/2014 1037   GFRAA 58 (L) 07/25/2018 0938   GFRAA >60 12/14/2014 1037    No results found for: SPEP, UPEP  Lab Results  Component Value Date   WBC 4.6 07/25/2018   NEUTROABS 1.9 07/25/2018   HGB 7.4 (L) 07/25/2018   HCT 24.4 (L) 07/25/2018   MCV 97.2 07/25/2018   PLT 281 07/25/2018      Chemistry      Component Value Date/Time   NA 139 07/25/2018 0938   NA 140 12/14/2014 1037   K 4.1 07/25/2018 0938   K 4.4 12/14/2014 1037   CL 107 07/25/2018 0938   CL 106 12/14/2014 1037   CO2 23 07/25/2018 0938   CO2 26 12/14/2014 1037   BUN 20 07/25/2018 0938   BUN 12 12/14/2014 1037   CREATININE 1.09 (H) 07/25/2018 0938   CREATININE 0.97 12/14/2014 1037   GLU 84 03/16/2014  Component Value Date/Time   CALCIUM 8.9 07/25/2018 0938   CALCIUM 9.2 12/14/2014 1037   ALKPHOS 78 07/25/2018 0938   ALKPHOS 138 (H) 12/14/2014  1037   AST 21 07/25/2018 0938   AST 20 12/14/2014 1037   ALT 11 07/25/2018 0938   ALT 11 (L) 12/14/2014 1037   BILITOT 0.5 07/25/2018 0938   BILITOT 0.5 12/14/2014 1037       RADIOGRAPHIC STUDIES: I have personally reviewed the radiological images as listed and agreed with the findings in the report. No results found.   ASSESSMENT & PLAN:  Ovarian cancer, unspecified laterality ([place]) # RECURRENT HIGH GRADE SEROUS ovarian cancer/platinum refractory November 18th-CT abdomen pelvis shows progression-peritoneal/serosal; but no ascites.  Ca-125 continues to rise.  Patient eligible for clinical trial at [facility] however while undergoing amendments.  We will proceed with Taxotere weekly.  Discussed with Dr. Theora Gianotti.  #Proceed with weekly Taxotere 25 mg/m 3 weeks on 1 week off. Labs today reviewed;  acceptable for treatment today-except anemia 7.4 hemoglobin [see discussion below]  Discussed the potential side effects including but not limited to-increasing fatigue, nausea vomiting, diarrhea, hair loss, sores in the mouth, increase risk of infection and also neuropathy.  Increased lacrimation.  # Severe Anemia- Hb 7.4; ? Sec to [employer] 11/06]; check LDH.  Plan blood transfusion early next week.  If this continues to be an issue then would recommend EPO agent.  # PN- G-1-stable.   # Thrombocytopenia-platelets 280 improved.  Monitor closely.  #Patient also concerned about her quality of life on chemotherapy/continue treatments.  Discussed regarding palliative care evaluation patient is interested.  # DISPOSITION: # Treatment today;  # HOLD tube today; PRBC transfusion on 12/09 or 12/10. # referral to [facility] in 1 week # follow up in 1 week-MD-labs-cbc/cmp/holdtube-Taxoetere-Dr.B   No orders of the defined types were placed in this encounter.  All questions were answered. The patient knows to call the clinic with any problems, questions or concerns.      Cammie Sickle,  MD 07/28/2018 9:27 AM

## 2018-07-25 NOTE — Telephone Encounter (Signed)
Please have the lab add LDH to labs from today.

## 2018-07-26 ENCOUNTER — Other Ambulatory Visit: Payer: Self-pay | Admitting: Internal Medicine

## 2018-07-26 DIAGNOSIS — G4700 Insomnia, unspecified: Secondary | ICD-10-CM

## 2018-07-26 DIAGNOSIS — C569 Malignant neoplasm of unspecified ovary: Secondary | ICD-10-CM

## 2018-07-26 LAB — CA 125: Cancer Antigen (CA) 125: 1425 U/mL — ABNORMAL HIGH (ref 0.0–38.1)

## 2018-07-27 LAB — PREPARE RBC (CROSSMATCH)

## 2018-07-28 ENCOUNTER — Telehealth: Payer: Self-pay | Admitting: Internal Medicine

## 2018-07-28 ENCOUNTER — Inpatient Hospital Stay: Payer: PPO

## 2018-07-28 DIAGNOSIS — D649 Anemia, unspecified: Secondary | ICD-10-CM

## 2018-07-28 DIAGNOSIS — R6889 Other general symptoms and signs: Secondary | ICD-10-CM

## 2018-07-28 DIAGNOSIS — Z5111 Encounter for antineoplastic chemotherapy: Secondary | ICD-10-CM | POA: Diagnosis not present

## 2018-07-28 MED ORDER — ACETAMINOPHEN 325 MG PO TABS
650.0000 mg | ORAL_TABLET | Freq: Once | ORAL | Status: AC
  Administered 2018-07-28: 650 mg via ORAL
  Filled 2018-07-28: qty 2

## 2018-07-28 MED ORDER — HEPARIN SOD (PORK) LOCK FLUSH 100 UNIT/ML IV SOLN
500.0000 [IU] | Freq: Every day | INTRAVENOUS | Status: AC | PRN
  Administered 2018-07-28: 500 [IU]
  Filled 2018-07-28: qty 5

## 2018-07-28 MED ORDER — DIPHENHYDRAMINE HCL 25 MG PO CAPS
25.0000 mg | ORAL_CAPSULE | Freq: Once | ORAL | Status: AC
  Administered 2018-07-28: 25 mg via ORAL
  Filled 2018-07-28: qty 1

## 2018-07-28 MED ORDER — SODIUM CHLORIDE 0.9% IV SOLUTION
250.0000 mL | Freq: Once | INTRAVENOUS | Status: AC
  Administered 2018-07-28: 250 mL via INTRAVENOUS
  Filled 2018-07-28: qty 250

## 2018-07-28 NOTE — Telephone Encounter (Signed)
Mychart message sent.

## 2018-07-29 LAB — CULTURE, BLOOD (ROUTINE X 2)

## 2018-07-29 LAB — BPAM RBC
Blood Product Expiration Date: 201912272359
ISSUE DATE / TIME: 201912090914
Unit Type and Rh: 5100

## 2018-07-29 LAB — TYPE AND SCREEN
ABO/RH(D): O POS
Antibody Screen: NEGATIVE
Unit division: 0

## 2018-08-01 ENCOUNTER — Inpatient Hospital Stay (HOSPITAL_BASED_OUTPATIENT_CLINIC_OR_DEPARTMENT_OTHER): Payer: PPO | Admitting: Hospice and Palliative Medicine

## 2018-08-01 ENCOUNTER — Inpatient Hospital Stay: Payer: PPO

## 2018-08-01 ENCOUNTER — Encounter: Payer: Self-pay | Admitting: Internal Medicine

## 2018-08-01 ENCOUNTER — Inpatient Hospital Stay (HOSPITAL_BASED_OUTPATIENT_CLINIC_OR_DEPARTMENT_OTHER): Payer: PPO | Admitting: Internal Medicine

## 2018-08-01 VITALS — BP 112/71 | HR 71 | Temp 97.8°F | Resp 16 | Wt 190.4 lb

## 2018-08-01 DIAGNOSIS — T451X5S Adverse effect of antineoplastic and immunosuppressive drugs, sequela: Secondary | ICD-10-CM | POA: Diagnosis not present

## 2018-08-01 DIAGNOSIS — E785 Hyperlipidemia, unspecified: Secondary | ICD-10-CM | POA: Diagnosis not present

## 2018-08-01 DIAGNOSIS — E039 Hypothyroidism, unspecified: Secondary | ICD-10-CM

## 2018-08-01 DIAGNOSIS — D696 Thrombocytopenia, unspecified: Secondary | ICD-10-CM | POA: Diagnosis not present

## 2018-08-01 DIAGNOSIS — C569 Malignant neoplasm of unspecified ovary: Secondary | ICD-10-CM | POA: Diagnosis not present

## 2018-08-01 DIAGNOSIS — R600 Localized edema: Secondary | ICD-10-CM

## 2018-08-01 DIAGNOSIS — Z853 Personal history of malignant neoplasm of breast: Secondary | ICD-10-CM

## 2018-08-01 DIAGNOSIS — Z90722 Acquired absence of ovaries, bilateral: Secondary | ICD-10-CM

## 2018-08-01 DIAGNOSIS — Z90710 Acquired absence of both cervix and uterus: Secondary | ICD-10-CM

## 2018-08-01 DIAGNOSIS — Z7189 Other specified counseling: Secondary | ICD-10-CM

## 2018-08-01 DIAGNOSIS — Z515 Encounter for palliative care: Secondary | ICD-10-CM | POA: Diagnosis not present

## 2018-08-01 DIAGNOSIS — R530 Neoplastic (malignant) related fatigue: Secondary | ICD-10-CM

## 2018-08-01 DIAGNOSIS — K5900 Constipation, unspecified: Secondary | ICD-10-CM

## 2018-08-01 DIAGNOSIS — I2510 Atherosclerotic heart disease of native coronary artery without angina pectoris: Secondary | ICD-10-CM

## 2018-08-01 DIAGNOSIS — I10 Essential (primary) hypertension: Secondary | ICD-10-CM

## 2018-08-01 DIAGNOSIS — Z5111 Encounter for antineoplastic chemotherapy: Secondary | ICD-10-CM | POA: Diagnosis not present

## 2018-08-01 DIAGNOSIS — I252 Old myocardial infarction: Secondary | ICD-10-CM

## 2018-08-01 DIAGNOSIS — G893 Neoplasm related pain (acute) (chronic): Secondary | ICD-10-CM | POA: Diagnosis not present

## 2018-08-01 DIAGNOSIS — Z79899 Other long term (current) drug therapy: Secondary | ICD-10-CM

## 2018-08-01 DIAGNOSIS — D649 Anemia, unspecified: Secondary | ICD-10-CM

## 2018-08-01 DIAGNOSIS — Z7982 Long term (current) use of aspirin: Secondary | ICD-10-CM

## 2018-08-01 DIAGNOSIS — C786 Secondary malignant neoplasm of retroperitoneum and peritoneum: Secondary | ICD-10-CM

## 2018-08-01 DIAGNOSIS — G629 Polyneuropathy, unspecified: Secondary | ICD-10-CM

## 2018-08-01 DIAGNOSIS — D6481 Anemia due to antineoplastic chemotherapy: Secondary | ICD-10-CM

## 2018-08-01 DIAGNOSIS — R531 Weakness: Secondary | ICD-10-CM | POA: Diagnosis not present

## 2018-08-01 LAB — CBC WITH DIFFERENTIAL/PLATELET
Abs Immature Granulocytes: 0.03 10*3/uL (ref 0.00–0.07)
BASOS PCT: 0 %
Basophils Absolute: 0 10*3/uL (ref 0.0–0.1)
Eosinophils Absolute: 0.1 10*3/uL (ref 0.0–0.5)
Eosinophils Relative: 1 %
HCT: 28.3 % — ABNORMAL LOW (ref 36.0–46.0)
Hemoglobin: 8.5 g/dL — ABNORMAL LOW (ref 12.0–15.0)
Immature Granulocytes: 1 %
Lymphocytes Relative: 37 %
Lymphs Abs: 2.2 10*3/uL (ref 0.7–4.0)
MCH: 28.3 pg (ref 26.0–34.0)
MCHC: 30 g/dL (ref 30.0–36.0)
MCV: 94.3 fL (ref 80.0–100.0)
Monocytes Absolute: 0.7 10*3/uL (ref 0.1–1.0)
Monocytes Relative: 12 %
NRBC: 0.3 % — AB (ref 0.0–0.2)
Neutro Abs: 3 10*3/uL (ref 1.7–7.7)
Neutrophils Relative %: 49 %
PLATELETS: 240 10*3/uL (ref 150–400)
RBC: 3 MIL/uL — ABNORMAL LOW (ref 3.87–5.11)
RDW: 18.8 % — ABNORMAL HIGH (ref 11.5–15.5)
WBC: 6 10*3/uL (ref 4.0–10.5)

## 2018-08-01 LAB — TYPE AND SCREEN
ABO/RH(D): O POS
Antibody Screen: NEGATIVE

## 2018-08-01 LAB — COMPREHENSIVE METABOLIC PANEL
ALBUMIN: 3.3 g/dL — AB (ref 3.5–5.0)
ALT: 13 U/L (ref 0–44)
AST: 20 U/L (ref 15–41)
Alkaline Phosphatase: 71 U/L (ref 38–126)
Anion gap: 7 (ref 5–15)
BUN: 17 mg/dL (ref 8–23)
CHLORIDE: 108 mmol/L (ref 98–111)
CO2: 23 mmol/L (ref 22–32)
Calcium: 8.9 mg/dL (ref 8.9–10.3)
Creatinine, Ser: 1.03 mg/dL — ABNORMAL HIGH (ref 0.44–1.00)
GFR calc Af Amer: >60 mL/min (ref >60)
GFR calc non Af Amer: 53 mL/min — ABNORMAL LOW (ref >60)
Glucose, Bld: 103 mg/dL — ABNORMAL HIGH (ref 70–99)
Potassium: 3.8 mmol/L (ref 3.5–5.1)
Sodium: 138 mmol/L (ref 135–145)
Total Bilirubin: 0.6 mg/dL (ref 0.3–1.2)
Total Protein: 6.8 g/dL (ref 6.5–8.1)

## 2018-08-01 MED ORDER — SODIUM CHLORIDE 0.9 % IV SOLN
35.0000 mg/m2 | Freq: Once | INTRAVENOUS | Status: AC
  Administered 2018-08-01: 70 mg via INTRAVENOUS
  Filled 2018-08-01: qty 7

## 2018-08-01 MED ORDER — SODIUM CHLORIDE 0.9% FLUSH
10.0000 mL | Freq: Once | INTRAVENOUS | Status: AC
  Administered 2018-08-01: 10 mL via INTRAVENOUS
  Filled 2018-08-01: qty 10

## 2018-08-01 MED ORDER — DEXAMETHASONE SODIUM PHOSPHATE 10 MG/ML IJ SOLN
10.0000 mg | Freq: Once | INTRAMUSCULAR | Status: AC
  Administered 2018-08-01: 10 mg via INTRAVENOUS
  Filled 2018-08-01: qty 1

## 2018-08-01 MED ORDER — HEPARIN SOD (PORK) LOCK FLUSH 100 UNIT/ML IV SOLN
INTRAVENOUS | Status: AC
  Filled 2018-08-01: qty 5

## 2018-08-01 MED ORDER — HEPARIN SOD (PORK) LOCK FLUSH 100 UNIT/ML IV SOLN
500.0000 [IU] | Freq: Once | INTRAVENOUS | Status: AC
  Administered 2018-08-01: 500 [IU] via INTRAVENOUS

## 2018-08-01 MED ORDER — SODIUM CHLORIDE 0.9 % IV SOLN
Freq: Once | INTRAVENOUS | Status: AC
  Administered 2018-08-01: 10:00:00 via INTRAVENOUS
  Filled 2018-08-01: qty 250

## 2018-08-01 MED ORDER — ONDANSETRON HCL 4 MG/2ML IJ SOLN
8.0000 mg | Freq: Once | INTRAMUSCULAR | Status: AC
  Administered 2018-08-01: 8 mg via INTRAVENOUS
  Filled 2018-08-01: qty 4

## 2018-08-01 NOTE — Assessment & Plan Note (Addendum)
#  RECURRENT HIGH GRADE SEROUS ovarian cancer/platinum refractory November 18th-CT abdomen pelvis shows progression-peritoneal/serosal; but no ascites.  Ca-125 continues to rise- 1400+.  Patient currently on weekly Taxotere.  #Proceed with cycle number 1 day 8 Taxotere today.  Increase the dose to 35 mg/m dose. Labs today reviewed;  acceptable for treatment today-except for moderate anemia..  # Severe Anemia-hemoglobin 7.4 status post PRBC transfusion hemoglobin today is 8.5.  No evidence of hemolysis.  Monitor closely.  # PN- G-1-stable  #Goals of care discussion-met with palliative care this morning.  # DISPOSITION: # Treatment today;  # follow up in 1 week-MD-labs-cbc/cmp-ca-125/holdtube-Taxoetere-Dr.B

## 2018-08-01 NOTE — Progress Notes (Signed)
[place] OFFICE PROGRESS NOTE  Patient Care Team: Jerrol Banana., MD as PCP - General (Family Medicine) Gillis Ends, MD as Referring Physician (Obstetrics and Gynecology) Cammie Sickle, MD as Consulting Physician (Oncology) Corey Skains, MD as Consulting Physician (Cardiology) Marval Regal, NP as Nurse Practitioner (Nurse Practitioner) Manya Silvas, MD as Consulting Physician (Gastroenterology)  Cancer Staging Malignant neoplasm of ovary [facility]) Staging form: Ovary, AJCC 7th Edition - Clinical: Stage IIIC (T3c, N0, M0) - Unsigned    Oncology History   # April- MAY 2016- HIGH GRADE SEROUS OVARIAN CANCER STAGE IIIC; CA-125 +2300+;  s/p OPTIMAL DEBULKING SURGERY   # MAY 2016[name] [finished in Sep 19th 2017]  # MAY CT 2017- RECURRENT/Peritoneal carcinomatosis/pelvic implant ~1.2cm/Ca-125-34;   # July 3rd 2017- CARBO-DOXIL q 4W [with neulasta]; CT May 11 2016- slight increase peritoneal / stable nodules. Cont chemo [finished DEC 2017]. Last dose CARBO-07/11/2016; DEC 28th 2017 CT- Increase in peritoneal deposits; increasing Ca 125 [60s]- PLATINUM REFRACTORY  # Jan 2018- Taxol-Avastin x6 cycles; Aug 2018- CR; Aug 2018- start Avastin Maintenance; MARCH 2019- PROGRESSION- STOP avastin.   # MID April 2019- Zejula [300 mg/day]; 4 days- later STOPPED sec to Chest pain;  # June 1st week- Lynparza; AUG 2019- Progression of disease  # AUG, 21st 2019- START CARBO-GEM x4 cycles; NOV, 19th CT Progression/rising Ca-125.   #July 25, 2018- Taxotere weekly.    # May 2019- TAKASUBO ? 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  ---------------------------------------------------------------------    DIAGNOSIS: MAY 2017; RECURRENT OVARIAN CA-high-grade serous/platinum refractory  STAGE: IV    ;GOALS: PALLIATIVE  CURRENT/MOST RECENT THERAPY: Docetaxel weekly     Malignant neoplasm of ovary ([place])   04/08/2018 - 07/08/2018 Chemotherapy    The patient had palonosetron (ALOXI) injection 0.25 mg, 0.25 mg, Intravenous,  Once, 4 of 4 cycles Administration: 0.25 mg (04/09/2018), 0.25 mg (04/30/2018), 0.25 mg (06/04/2018), 0.25 mg (06/25/2018) pegfilgrastim-cbqv (UDENYCA) injection 6 mg, 6 mg, Subcutaneous, Once, 3 of 3 cycles Administration: 6 mg (05/01/2018), 6 mg (06/05/2018), 6 mg (06/26/2018) CARBOplatin (PARAPLATIN) 430 mg in sodium chloride 0.9 % 250 mL chemo infusion, 430 mg (100 % of original dose 430.5 mg), Intravenous,  Once, 4 of 4 cycles Dose modification:   (original dose 430.5 mg, Cycle 1) Administration: 430 mg (04/09/2018), 430 mg (04/30/2018), 430 mg (06/04/2018), 430 mg (06/25/2018) gemcitabine (GEMZAR) 2,000 mg in sodium chloride 0.9 % 250 mL chemo infusion, 1,976 mg, Intravenous,  Once, 4 of 4 cycles Administration: 2,000 mg (04/09/2018), 2,000 mg (04/30/2018), 2,000 mg (06/04/2018), 2,000 mg (06/25/2018)  for chemotherapy treatment.      Ovarian cancer, unspecified laterality ([place])   07/25/2018 -  Chemotherapy    The patient had DOCEtaxel (TAXOTERE) 50 mg in sodium chloride 0.9 % 150 mL chemo infusion, 25 mg/m2 = 50 mg, Intravenous,  Once, 1 of 4 cycles Dose modification: 35 mg/m2 (original dose 25 mg/m2, Cycle 1, Reason: Provider Judgment) Administration: 50 mg (07/25/2018)  for chemotherapy treatment.        INTERVAL HISTORY:  Jamie Conner 77 y.o.  female pleasant patient above history of high-grade serous ovarian cancer-platinum refractory currently on  weekly Taxotere is here for follow-up.  Patient had a PRBC transfusion for hemoglobin of 7.4 earlier in the week.   Fatigue mildly improved.  Patient denies any skin rash.  Denies any swelling in the legs.  Appetite is fair.  Intermittent constipation.  Denies any significant pain.  No fevers or chills.  Review of Systems  Constitutional: Positive for malaise/fatigue. Negative for chills, diaphoresis, fever and weight loss.  HENT: Negative for nosebleeds and sore throat.   Eyes: Negative for double vision.  Respiratory: Negative for cough, hemoptysis, sputum production, shortness of breath and wheezing.   Cardiovascular: Negative for chest pain, palpitations, orthopnea and leg swelling.  Gastrointestinal: Positive for constipation. Negative for abdominal pain, blood in stool, diarrhea, heartburn, melena, nausea and vomiting.  Genitourinary: Negative for dysuria, frequency and urgency.  Musculoskeletal: Negative for back pain and joint pain.  Skin: Negative.  Negative for itching and rash.  Neurological: Negative for dizziness, tingling, focal weakness, weakness and headaches.  Endo/Heme/Allergies: Does not bruise/bleed easily.  Psychiatric/Behavioral: Negative for depression. The patient is not nervous/anxious and does not have insomnia.       PAST MEDICAL HISTORY :  Past Medical History:  Diagnosis Date  . Atrial fibrillation ([place])   . Breast cancer ([place]) 1998  . Cancer of breast ([place]) 1998   Left/radiation  . CINV (chemotherapy-induced nausea and vomiting)   . GERD (gastroesophageal reflux disease)   . Hyperlipidemia   . Hypothyroidism   . Mitral insufficiency   . Ovarian cancer ([place])    s/p BSO optimal tumor debulking May 2016/chemo  . Personal history of chemotherapy since 2016   ovarian cancer  . Personal history of radiation therapy 1998  . PVC (premature ventricular contraction)     PAST SURGICAL HISTORY :   Past Surgical History:  Procedure Laterality Date  . ABDOMINAL HYSTERECTOMY    . BILATERAL SALPINGOOPHORECTOMY  May 2016   with optimal tumor debulking   . BREAST  LUMPECTOMY Left 1998  . BREAST SURGERY     Left  . CESAREAN SECTION     x2  . CHOLECYSTECTOMY    . COLONOSCOPY  2006  . DEBULKING N/A 12/22/2014   Procedure: DEBULKING;  Surgeon: Gillis Ends, MD;  Location: [facility] ORS;  Service: Gynecology;  Laterality: N/A;  . LAPAROTOMY N/A 12/22/2014   Procedure: EXPLORATORY LAPAROTOMY;  Surgeon: Robert Bellow, MD;  Location: [facility] ORS;  Service: General;  Laterality: N/A;  . LAPAROTOMY WITH STAGING N/A 12/22/2014   Procedure: LAPAROTOMY WITH STAGING;  Surgeon: Gillis Ends, MD;  Location: [facility] ORS;  Service: Gynecology;  Laterality: N/A;  . LEFT HEART CATH AND CORONARY ANGIOGRAPHY N/A 12/10/2017   Procedure: LEFT HEART CATH AND CORONARY ANGIOGRAPHY;  Surgeon: Yolonda Kida, MD;  Location: [place] [facility];  Service: Cardiovascular;  Laterality: N/A;  . PERIPHERAL VASCULAR CATHETERIZATION Left 12/22/2014   Procedure: PORTA CATH INSERTION;  Surgeon: Robert Bellow, MD;  Location: [facility];  Service: General;  Laterality: Left;  . PORTACATH PLACEMENT Right 12/22/2014   Procedure: INSERTION PORT-A-CATH;  Surgeon: Robert Bellow, MD;  Location: [facility] OR[facility];  Service: General;  Laterality: Right;  . SALPINGOOPHORECTOMY Bilateral 12/22/2014   Procedure: SALPINGO OOPHORECTOMY;  Surgeon: Gillis Ends, MD;  Location: [facility] OR[facility];  Service: Gynecology;  Laterality: Bilateral;  . UPPER GI ENDOSCOPY  09/25/04   hiatus hernia, schatzki ring and a single gastric polyp    FAMILY HISTORY :   Family History  Problem Relation Age of  Onset  . Cancer Mother        breast, throat, and stomach  . Breast cancer Mother   . Heart disease Father   . Pulmonary embolism Sister   . Cancer Brother 60       angiosarcoma of the chest; carcinoid small intestinal tumor  . Hypertension Brother   . Stroke Brother   . Cancer Maternal Aunt        breast cancer  . Cancer Maternal Grandfather 60       pancreatic    SOCIAL HISTORY:   Social History    Tobacco Use  . Smoking status: Never Smoker  . Smokeless tobacco: Never Used  Substance Use Topics  . Alcohol use: No  . Drug use: No    ALLERGIES:  is allergic to carafate [sucralfate] and sulfa antibiotics.  MEDICATIONS:  Current Outpatient Medications  Medication Sig Dispense Refill  . acetaminophen (TYLENOL) 500 MG tablet Take 500 mg by mouth every 6 (six) hours as needed.    Marland Kitchen alum & mag hydroxide-simeth (MAALOX/MYLANTA) 200-200-20 MG/5ML suspension Take 15 mLs by mouth every 6 (six) hours as needed for indigestion or heartburn. 355 mL 0  . aspirin EC 325 MG tablet Take 1 tablet (325 mg total) by mouth daily. 100 tablet 3  . chlorhexidine (PERIDEX) 0.12 % solution     . Cholecalciferol (VITAMIN D) 2000 units tablet Take 1 tablet (2,000 Units total) by mouth daily. 30 tablet 12  . Docosanol (ABREVA EX) Apply 1 application topically as needed. Fever blisters    . fluticasone (FLONASE) 50 MCG/ACT nasal spray Place 1 spray into both nostrils daily as needed.     . furosemide (LASIX) 20 MG tablet Take 0.5 tablets (10 mg total) by mouth daily. 30 tablet 0  . lactulose (CHRONULAC) 10 GM/15ML solution Take 15 mLs (10 g total) by mouth daily as needed for moderate constipation. 236 mL 0  . levothyroxine (SYNTHROID, LEVOTHROID) 50 MCG tablet TAKE ONE TABLET ON AN EMPTY STOMACH WITHA GLASS OF WATER AT LEAST 30 TO 60 MINUTES BEFORE BREAKFAST 30 tablet 12  . lidocaine-prilocaine (EMLA) cream Apply to port area 30-45 mins prior to chemo. 30 g 5  . loratadine (CLARITIN) 10 MG tablet Take 10 mg by mouth daily as needed.     Marland Kitchen LORazepam (ATIVAN) 0.5 MG tablet TAKE ONE TABLET BY MOUTH AT BEDTIME AS NEEDED FOR ANXIETY/SLEEP 30 tablet 0  . Lysine 500 MG TABS Take 1 tablet by mouth daily as needed.     . metoprolol succinate (TOPROL-XL) 25 MG 24 hr tablet Take 1 tablet by mouth daily.    . Multiple Vitamin (MULTIVITAMIN) capsule Take 1 capsule by mouth daily.    . ondansetron (ZOFRAN) 4 MG tablet  One pill 30-60 mins prior to taking zejula to prevent nausea/vomitting. 45 tablet 3  . ondansetron (ZOFRAN) 8 MG tablet Take 1 tablet (8 mg total) by mouth every 8 (eight) hours as needed for nausea or vomiting. 20 tablet 4  . pantoprazole (PROTONIX) 40 MG tablet TAKE ONE TABLET EVERY DAY 30 tablet 1  . polyethylene glycol (MIRALAX / GLYCOLAX) packet Take 17 g by mouth daily as needed.    . prochlorperazine (COMPAZINE) 10 MG tablet Take 1 tablet (10 mg total) by mouth every 6 (six) hours as needed for nausea or vomiting. 30 tablet 4   Current Facility-Administered Medications  Medication Dose Route Frequency Provider Last Rate Last Dose  . heparin lock flush 100 unit/mL  500 Units  Intravenous Once Charlaine Dalton R, MD      . sodium chloride flush (NS) 0.9 % injection 10 mL  10 mL Intravenous Once Cammie Sickle, MD       Facility-Administered Medications Ordered in Other Visits  Medication Dose Route Frequency Provider Last Rate Last Dose  . 0.9 %  sodium chloride infusion   Intravenous Once Charlaine Dalton R, MD      . dexamethasone (DECADRON) injection 10 mg  10 mg Intravenous Once Charlaine Dalton R, MD      . DOCEtaxel (TAXOTERE) 70 mg in sodium chloride 0.9 % 150 mL chemo infusion  35 mg/m2 (Treatment Plan Recorded) Intravenous Once Cammie Sickle, MD      . ondansetron [facility]) injection 8 mg  8 mg Intravenous Once Cammie Sickle, MD        PHYSICAL EXAMINATION: ECOG PERFORMANCE STATUS: 1 - Symptomatic but completely ambulatory  BP 112/71 (BP Location: Left Arm, Patient Position: Sitting)   Pulse 71   Temp 97.8 F (36.6 C) (Tympanic)   Resp 16   Wt 190 lb 6.4 oz (86.4 kg)   BMI 32.68 kg/m   Filed Weights   08/01/18 0903  Weight: 190 lb 6.4 oz (86.4 kg)    Physical Exam  Constitutional: She is oriented to person, place, and time and well-developed, well-nourished, and in no distress.  Accompanied by husband.  She is walking by herself.   HENT:  Head: Normocephalic and atraumatic.  Mouth/Throat: Oropharynx is clear and moist. No oropharyngeal exudate.  Eyes: Pupils are equal, round, and reactive to light.  Neck: Normal range of motion. Neck supple.  Cardiovascular: Normal rate and regular rhythm.  Pulmonary/Chest: No respiratory distress. She has no wheezes.  Abdominal: Soft. Bowel sounds are normal. She exhibits no distension and no mass. There is no abdominal tenderness. There is no rebound and no guarding.  Musculoskeletal: Normal range of motion.        General: No tenderness or edema.  Neurological: She is alert and oriented to person, place, and time.  Skin: Skin is warm.  Psychiatric: Affect normal.       LABORATORY DATA:  I have reviewed the data as listed    Component Value Date/Time   NA 138 08/01/2018 0825   NA 140 12/14/2014 1037   K 3.8 08/01/2018 0825   K 4.4 12/14/2014 1037   CL 108 08/01/2018 0825   CL 106 12/14/2014 1037   CO2 23 08/01/2018 0825   CO2 26 12/14/2014 1037   GLUCOSE 103 (H) 08/01/2018 0825   GLUCOSE 87 12/14/2014 1037   BUN 17 08/01/2018 0825   BUN 12 12/14/2014 1037   CREATININE 1.03 (H) 08/01/2018 0825   CREATININE 0.97 12/14/2014 1037   CALCIUM 8.9 08/01/2018 0825   CALCIUM 9.2 12/14/2014 1037   PROT 6.8 08/01/2018 0825   PROT 7.3 12/14/2014 1037   ALBUMIN 3.3 (L) 08/01/2018 0825   ALBUMIN 3.8 12/14/2014 1037   AST 20 08/01/2018 0825   AST 20 12/14/2014 1037   ALT 13 08/01/2018 0825   ALT 11 (L) 12/14/2014 1037   ALKPHOS 71 08/01/2018 0825   ALKPHOS 138 (H) 12/14/2014 1037   BILITOT 0.6 08/01/2018 0825   BILITOT 0.5 12/14/2014 1037   GFRNONAA 53 (L) 08/01/2018 0825   GFRNONAA 59 (L) 12/14/2014 1037   GFRAA >60 08/01/2018 0825   GFRAA >60 12/14/2014 1037    No results found for: SPEP, UPEP  Lab Results  Component Value Date  WBC 6.0 08/01/2018   NEUTROABS 3.0 08/01/2018   HGB 8.5 (L) 08/01/2018   HCT 28.3 (L) 08/01/2018   MCV 94.3 08/01/2018   PLT 240  08/01/2018      Chemistry      Component Value Date/Time   NA 138 08/01/2018 0825   NA 140 12/14/2014 1037   K 3.8 08/01/2018 0825   K 4.4 12/14/2014 1037   CL 108 08/01/2018 0825   CL 106 12/14/2014 1037   CO2 23 08/01/2018 0825   CO2 26 12/14/2014 1037   BUN 17 08/01/2018 0825   BUN 12 12/14/2014 1037   CREATININE 1.03 (H) 08/01/2018 0825   CREATININE 0.97 12/14/2014 1037   GLU 84 03/16/2014      Component Value Date/Time   CALCIUM 8.9 08/01/2018 0825   CALCIUM 9.2 12/14/2014 1037   ALKPHOS 71 08/01/2018 0825   ALKPHOS 138 (H) 12/14/2014 1037   AST 20 08/01/2018 0825   AST 20 12/14/2014 1037   ALT 13 08/01/2018 0825   ALT 11 (L) 12/14/2014 1037   BILITOT 0.6 08/01/2018 0825   BILITOT 0.5 12/14/2014 1037       RADIOGRAPHIC STUDIES: I have personally reviewed the radiological images as listed and agreed with the findings in the report. No results found.   ASSESSMENT & PLAN:  Ovarian cancer, unspecified laterality ([place]) # RECURRENT HIGH GRADE SEROUS ovarian cancer/platinum refractory November 18th-CT abdomen pelvis shows progression-peritoneal/serosal; but no ascites.  Ca-125 continues to rise- 1400+.  Patient currently on weekly Taxotere.  #Proceed with cycle number 1 day 8 Taxotere today.  Increase the dose to 35 mg/m dose. Labs today reviewed;  acceptable for treatment today-except for moderate anemia..  # Severe Anemia-hemoglobin 7.4 status post PRBC transfusion hemoglobin today is 8.5.  No evidence of hemolysis.  Monitor closely.  # PN- G-1-stable  #Goals of care discussion-met with palliative care this morning.  # DISPOSITION: # Treatment today;  # follow up in 1 week-MD-labs-cbc/cmp-ca-125/holdtube-Taxoetere-Dr.B   Orders Placed This Encounter  Procedures  . CBC With Differential  . Comprehensive metabolic panel    Standing Status:   Future    Number of Occurrences:   1    Standing Expiration Date:   08/01/2019  . CBC with Differential     Standing Status:   Future    Number of Occurrences:   1    Standing Expiration Date:   08/01/2019  . CBC with Differential    Standing Status:   Future    Standing Expiration Date:   08/02/2019  . Comprehensive metabolic panel    Standing Status:   Future    Standing Expiration Date:   08/02/2019  . CA 125    Standing Status:   Future    Standing Expiration Date:   08/02/2019   All questions were answered. The patient knows to call the clinic with any problems, questions or concerns.      Cammie Sickle, MD 08/01/2018 10:17 AM

## 2018-08-01 NOTE — Progress Notes (Signed)
[facility]  Telephone:([phone] [phone]   Name: Jamie Conner Date: 08/01/2018 MRN: [id number]  DOB: 1941/01/31  Patient Care Team: Jerrol Banana., MD as PCP - General (Family Medicine) Gillis Ends, MD as Referring Physician (Obstetrics and Gynecology) Cammie Sickle, MD as Consulting Physician (Oncology) Corey Skains, MD as Consulting Physician (Cardiology) Marval Regal, NP as Nurse Practitioner (Nurse Practitioner) Manya Silvas, MD as Consulting Physician (Gastroenterology)    REASON FOR CONSULTATION: Palliative Care consult requested for this 77 y.o. female with multiple medical problems including recurrent stage IV ovarian cancer (initially diagnosed May 2016) with recurrent peritoneal carcinomatosis with disease progression on several lines of chemotherapy currently on Taxotere.  PMH also notable for severe anemia requiring transfusion, peripheral neuropathy, CAD status post MI, hypothyroidism, hypertension, and history of breast cancer.  Most recent abdominal CT showed disease progression.  Patient was considered for trial at [facility] but the clinical trial was currently undergoing revisions.  Patient was referred to palliative care for symptom management and to discuss goals.   SOCIAL HISTORY:    Patient lives at home with her husband.  She has a son and a daughter.  Her son lives in [place] and her daughter lives in [place].  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  She has a music degree from [employer].Marland Kitchen  ADVANCE DIRECTIVES:  On file  CODE STATUS:  PAST MEDICAL HISTORY: Past Medical History:  Diagnosis Date  . Atrial fibrillation ([place])   . Breast cancer ([place]) 1998  . Cancer of breast ([place]) 1998   Left/radiation  . CINV (chemotherapy-induced nausea and vomiting)   . GERD (gastroesophageal reflux disease)   . Hyperlipidemia   . Hypothyroidism    . Mitral insufficiency   . Ovarian cancer ([place])    s/p BSO optimal tumor debulking May 2016/chemo  . Personal history of chemotherapy since 2016   ovarian cancer  . Personal history of radiation therapy 1998  . PVC (premature ventricular contraction)     PAST SURGICAL HISTORY:  Past Surgical History:  Procedure Laterality Date  . ABDOMINAL HYSTERECTOMY    . BILATERAL SALPINGOOPHORECTOMY  May 2016   with optimal tumor debulking   . BREAST LUMPECTOMY Left 1998  . BREAST SURGERY     Left  . CESAREAN SECTION     x2  . CHOLECYSTECTOMY    . COLONOSCOPY  2006  . DEBULKING N/A 12/22/2014   Procedure: DEBULKING;  Surgeon: Gillis Ends, MD;  Location: [facility] ORS;  Service: Gynecology;  Laterality: N/A;  . LAPAROTOMY N/A 12/22/2014   Procedure: EXPLORATORY LAPAROTOMY;  Surgeon: Robert Bellow, MD;  Location: [facility] OR[facility];  Service: General;  Laterality: N/A;  . LAPAROTOMY WITH STAGING N/A 12/22/2014   Procedure: LAPAROTOMY WITH STAGING;  Surgeon: Gillis Ends, MD;  Location: [facility] OR[facility];  Service: Gynecology;  Laterality: N/A;  . LEFT HEART CATH AND CORONARY ANGIOGRAPHY N/A 12/10/2017   Procedure: LEFT HEART CATH AND CORONARY ANGIOGRAPHY;  Surgeon: Yolonda Kida, MD;  Location: [place] [facility];  Service: Cardiovascular;  Laterality: N/A;  . PERIPHERAL VASCULAR CATHETERIZATION Left 12/22/2014   Procedure: PORTA CATH INSERTION;  Surgeon: Robert Bellow, MD;  Location: [facility];  Service: General;  Laterality: Left;  . PORTACATH PLACEMENT Right 12/22/2014   Procedure: INSERTION PORT-A-CATH;  Surgeon: Robert Bellow, MD;  Location: [facility];  Service: General;  Laterality: Right;  . SALPINGOOPHORECTOMY  Bilateral 12/22/2014   Procedure: SALPINGO OOPHORECTOMY;  Surgeon: Gillis Ends, MD;  Location: [facility];  Service: Gynecology;  Laterality: Bilateral;  . UPPER GI ENDOSCOPY  09/25/04   hiatus hernia, schatzki ring and a single gastric polyp    HEMATOLOGY/ONCOLOGY  HISTORY:  Oncology History   # April- MAY 2016- HIGH GRADE SEROUS OVARIAN CANCER STAGE IIIC; CA-125 +2300+;  s/p OPTIMAL DEBULKING SURGERY   # MAY 2016[name] [finished in Sep 19th 2017]  # MAY CT 2017- RECURRENT/Peritoneal carcinomatosis/pelvic implant ~1.2cm/Ca-125-34;   # July 3rd 2017- CARBO-DOXIL q 4W [with neulasta]; CT May 11 2016- slight increase peritoneal / stable nodules. Cont chemo [finished DEC 2017]. Last dose CARBO-07/11/2016; DEC 28th 2017 CT- Increase in peritoneal deposits; increasing Ca 125 [60s]- PLATINUM REFRACTORY  # Jan 2018- Taxol-Avastin x6 cycles; Aug 2018- CR; Aug 2018- start Avastin Maintenance; MARCH 2019- PROGRESSION- STOP avastin.   # MID April 2019- Zejula [300 mg/day]; 4 days- later STOPPED sec to Chest pain;  # June 1st week- Lynparza; AUG 2019- Progression of disease  # AUG, 21st 2019- START CARBO-GEM x4 cycles; NOV, 19th CT Progression/rising Ca-125.   # DEC 1st week- Taxotere weekly.    # May 2019- TAKASUBO ? [s/p cardiac cath- Dr.Kowalski]  # G-1-2 hand foot syndrome-   # LEFT BREAST CA s/p Lumpec & RT s/p TAM   # Afib [cardiology]; JUNE 2017- MUGA 62%  Jan 2018- MOLECULAR TESTING- ATM DELETERIOUS HETEROZYGOUS mutation; TUMOR BRCA- NEG; Myriad genomic instability- NEGATIVE;   # NGS/Omniseq- BRCA-2 copy loss/ * [april2019]; NEG- BRCA Mutations/N-TRK/MSI-STABLE.  ---------------------------------------------------------------------    DIAGNOSIS: MAY 2017; RECURRENT OVARIAN CA-high-grade serous/platinum refractory  STAGE: IV    ;GOALS: PALLIATIVE  CURRENT/MOST RECENT THERAPY: Docetaxel      Malignant neoplasm of ovary ([place])   04/08/2018 - 07/08/2018 Chemotherapy    The patient had palonosetron (ALOXI) injection 0.25 mg, 0.25 mg, Intravenous,  Once, 4 of 4 cycles Administration: 0.25 mg (04/09/2018), 0.25 mg (04/30/2018), 0.25 mg (06/04/2018), 0.25 mg (06/25/2018) pegfilgrastim-cbqv (UDENYCA) injection 6 mg, 6 mg, Subcutaneous,  Once, 3 of 3 cycles Administration: 6 mg (05/01/2018), 6 mg (06/05/2018), 6 mg (06/26/2018) CARBOplatin (PARAPLATIN) 430 mg in sodium chloride 0.9 % 250 mL chemo infusion, 430 mg (100 % of original dose 430.5 mg), Intravenous,  Once, 4 of 4 cycles Dose modification:   (original dose 430.5 mg, Cycle 1) Administration: 430 mg (04/09/2018), 430 mg (04/30/2018), 430 mg (06/04/2018), 430 mg (06/25/2018) gemcitabine (GEMZAR) 2,000 mg in sodium chloride 0.9 % 250 mL chemo infusion, 1,976 mg, Intravenous,  Once, 4 of 4 cycles Administration: 2,000 mg (04/09/2018), 2,000 mg (04/30/2018), 2,000 mg (06/04/2018), 2,000 mg (06/25/2018)  for chemotherapy treatment.      Ovarian cancer, unspecified laterality ([place])   07/09/2018 -  Chemotherapy    The patient had DOCEtaxel (TAXOTERE) 50 mg in sodium chloride 0.9 % 150 mL chemo infusion, 25 mg/m2 = 50 mg, Intravenous,  Once, 1 of 4 cycles Administration: 50 mg (07/25/2018)  for chemotherapy treatment.      ALLERGIES:  is allergic to carafate [sucralfate] and sulfa antibiotics.  MEDICATIONS:  Current Outpatient Medications  Medication Sig Dispense Refill  . acetaminophen (TYLENOL) 500 MG tablet Take 500 mg by mouth every 6 (six) hours as needed.    Marland Kitchen alum & mag hydroxide-simeth (MAALOX/MYLANTA) 200-200-20 MG/5ML suspension Take 15 mLs by mouth every 6 (six) hours as needed for indigestion or heartburn. 355 mL 0  . aspirin EC 325 MG tablet Take 1 tablet (  325 mg total) by mouth daily. 100 tablet 3  . chlorhexidine (PERIDEX) 0.12 % solution     . Cholecalciferol (VITAMIN D) 2000 units tablet Take 1 tablet (2,000 Units total) by mouth daily. 30 tablet 12  . Docosanol (ABREVA EX) Apply 1 application topically as needed. Fever blisters    . fluticasone (FLONASE) 50 MCG/ACT nasal spray Place 1 spray into both nostrils daily as needed.     . furosemide (LASIX) 20 MG tablet Take 0.5 tablets (10 mg total) by mouth daily. 30 tablet 0  . lactulose (CHRONULAC) 10 GM/15ML  solution Take 15 mLs (10 g total) by mouth daily as needed for moderate constipation. 236 mL 0  . levothyroxine (SYNTHROID, LEVOTHROID) 50 MCG tablet TAKE ONE TABLET ON AN EMPTY STOMACH WITHA GLASS OF WATER AT LEAST 30 TO 60 MINUTES BEFORE BREAKFAST 30 tablet 12  . lidocaine-prilocaine (EMLA) cream Apply to port area 30-45 mins prior to chemo. 30 g 5  . loratadine (CLARITIN) 10 MG tablet Take 10 mg by mouth daily as needed.     Marland Kitchen LORazepam (ATIVAN) 0.5 MG tablet TAKE ONE TABLET BY MOUTH AT BEDTIME AS NEEDED FOR ANXIETY/SLEEP 30 tablet 0  . Lysine 500 MG TABS Take 1 tablet by mouth daily as needed.     . metoprolol succinate (TOPROL-XL) 25 MG 24 hr tablet Take 1 tablet by mouth daily.    . Multiple Vitamin (MULTIVITAMIN) capsule Take 1 capsule by mouth daily.    . ondansetron (ZOFRAN) 4 MG tablet One pill 30-60 mins prior to taking zejula to prevent nausea/vomitting. 45 tablet 3  . ondansetron (ZOFRAN) 8 MG tablet Take 1 tablet (8 mg total) by mouth every 8 (eight) hours as needed for nausea or vomiting. 20 tablet 4  . pantoprazole (PROTONIX) 40 MG tablet TAKE ONE TABLET EVERY DAY 30 tablet 1  . polyethylene glycol (MIRALAX / GLYCOLAX) packet Take 17 g by mouth daily as needed.    . prochlorperazine (COMPAZINE) 10 MG tablet Take 1 tablet (10 mg total) by mouth every 6 (six) hours as needed for nausea or vomiting. 30 tablet 4   No current facility-administered medications for this visit.    Facility-Administered Medications Ordered in Other Visits  Medication Dose Route Frequency Provider Last Rate Last Dose  . heparin lock flush 100 unit/mL  500 Units Intravenous Once Charlaine Dalton R, MD      . sodium chloride flush (NS) 0.9 % injection 10 mL  10 mL Intravenous Once Cammie Sickle, MD        VITAL SIGNS: There were no vitals taken for this visit. There were no vitals filed for this visit.  Estimated body mass index is 32.68 kg/m as calculated from the following:   Height as of  07/10/18: '5\' 4"'$  (1.626 m).   Weight as of an earlier encounter on 08/01/18: 190 lb 6.4 oz (86.4 kg).  LABS: CBC:    Component Value Date/Time   WBC 6.0 08/01/2018 0836   HGB 8.5 (L) 08/01/2018 0836   HGB 14.7 12/14/2014 1037   HCT 28.3 (L) 08/01/2018 0836   HCT 45.1 12/14/2014 1037   PLT 240 08/01/2018 0836   PLT 284 12/14/2014 1037   MCV 94.3 08/01/2018 0836   MCV 93 12/14/2014 1037   NEUTROABS 3.0 08/01/2018 0836   NEUTROABS 6.7 (H) 12/14/2014 1037   LYMPHSABS 2.2 08/01/2018 0836   LYMPHSABS 3.2 12/14/2014 1037   MONOABS 0.7 08/01/2018 0836   MONOABS 1.4 (H) 12/14/2014 1037  EOSABS 0.1 08/01/2018 0836   EOSABS 0.1 12/14/2014 1037   BASOSABS 0.0 08/01/2018 0836   BASOSABS 0.1 12/14/2014 1037   Comprehensive Metabolic Panel:    Component Value Date/Time   NA 138 08/01/2018 0825   NA 140 12/14/2014 1037   K 3.8 08/01/2018 0825   K 4.4 12/14/2014 1037   CL 108 08/01/2018 0825   CL 106 12/14/2014 1037   CO2 23 08/01/2018 0825   CO2 26 12/14/2014 1037   BUN 17 08/01/2018 0825   BUN 12 12/14/2014 1037   CREATININE 1.03 (H) 08/01/2018 0825   CREATININE 0.97 12/14/2014 1037   GLUCOSE 103 (H) 08/01/2018 0825   GLUCOSE 87 12/14/2014 1037   CALCIUM 8.9 08/01/2018 0825   CALCIUM 9.2 12/14/2014 1037   AST 20 08/01/2018 0825   AST 20 12/14/2014 1037   ALT 13 08/01/2018 0825   ALT 11 (L) 12/14/2014 1037   ALKPHOS 71 08/01/2018 0825   ALKPHOS 138 (H) 12/14/2014 1037   BILITOT 0.6 08/01/2018 0825   BILITOT 0.5 12/14/2014 1037   PROT 6.8 08/01/2018 0825   PROT 7.3 12/14/2014 1037   ALBUMIN 3.3 (L) 08/01/2018 0825   ALBUMIN 3.8 12/14/2014 1037    RADIOGRAPHIC STUDIES: Dg Chest 2 View  Result Date: 07/06/2018 CLINICAL DATA:  Fever. Currently being treated for ovarian cancer. EXAM: CHEST - 2 VIEW COMPARISON:  Chest CT dated 03/28/2018 and portable chest dated 12/10/2017. FINDINGS: Stable borderline enlarged cardiac silhouette. The lungs remain mildly hyperexpanded with  mild peribronchial thickening. Stable left subclavian porta catheter. Mild thoracic spine degenerative changes. Cholecystectomy clips. IMPRESSION: No acute abnormality. Stable mild changes of COPD and chronic bronchitis. Electronically Signed   By: Claudie Revering M.D.   On: 07/06/2018 10:47   Ct Abdomen Pelvis W Contrast  Result Date: 07/08/2018 CLINICAL DATA:  Ovarian cancer diagnosed in May 2016 with bilateral salpingo oophorectomy and omental tumor debulking. Subsequent chemotherapy with rising CA 25 levels and low-grade fever. Remote history of left breast cancer. EXAM: CT ABDOMEN AND PELVIS WITH CONTRAST TECHNIQUE: Multidetector CT imaging of the abdomen and pelvis was performed using the standard protocol following bolus administration of intravenous contrast. CONTRAST:  127m ISOVUE-300 IOPAMIDOL (ISOVUE-300) INJECTION 61% COMPARISON:  Abdominopelvic CT 03/28/2018 and 10/23/2017. PET-CT 11/04/2017. FINDINGS: Lower chest: Clear lung bases. There is no significant pleural or pericardial effusion. There is a stable small hiatal hernia. Hepatobiliary: There is a stable hemangioma posteriorly in the right hepatic lobe, measuring 3.8 x 2.4 cm on image 16/2. No new or enlarging hepatic lesions. No significant biliary dilatation status post cholecystectomy. Pancreas: Unremarkable. No pancreatic ductal dilatation or surrounding inflammatory changes. Spleen: Normal in size without focal abnormality. Previously demonstrated cystic lesion medially along the splenic hilum has resolved. Adrenals/Urinary Tract: Both adrenal glands appear normal. Both kidneys appear normal without evidence of renal mass or hydronephrosis. The bladder is incompletely distended without apparent abnormality. Stomach/Bowel: No evidence of bowel wall thickening, distention or surrounding inflammatory change. There is a stable duodenal diverticulum. There are multiple enlarging peritoneal implants, several of which are associated with the  mesenteric serosal side of the small bowel. Previously noted lesion protruding into the lumen of the distal sigmoid colon is not as well seen. There is no evidence of bowel obstruction or perforation on the current study. Vascular/Lymphatic: No discretely enlarged abdominopelvic lymph nodes. Moderate aortic and branch vessel atherosclerosis without acute vascular findings. Reproductive: Hysterectomy. Nodularity along the vaginal cuff has mildly worsened. Other: As above, there are multiple enlarging peritoneal  implants consistent with progressive metastatic disease. The previously referenced lesions include: Left upper quadrant lesion measures 4.2 x 2.7 cm on image 25/2 (previously 1.8 cm maximally), right lateral abdominal lesion measures 2.6 x 1.8 cm on image 41/2 (previously 2.2 cm) and left lower quadrant lesion measures 1.8 cm on image 50/2 (previously 1.8 cm). Left pelvic mass superior to the sigmoid colon measures 2.6 x 1.7 cm on image 62/2. No ascites. Musculoskeletal: No acute or significant osseous findings. IMPRESSION: 1. Further progression in multifocal peritoneal metastatic disease. Several of the lesions are associated with the bowel serosa and place the patient at risk for bowel obstruction, not demonstrated at this time. No significant ascites. 2. No evidence of solid visceral organ metastatic disease. 3. Stable hepatic hemangioma. 4.  Aortic Atherosclerosis (ICD10-I70.0). Electronically Signed   By: Richardean Sale M.D.   On: 07/08/2018 13:35    PERFORMANCE STATUS (ECOG) : 1 - Symptomatic but completely ambulatory  Review of Systems As noted above. Otherwise, a complete review of systems is negative.  Physical Exam General: NAD, frail appearing, thin Cardiovascular: regular rate and rhythm Pulmonary: clear ant fields Abdomen: soft, nontender, + bowel sounds Extremities: Trace edema, no joint deformities Skin: no rashes Neurological: Weakness but otherwise nonfocal  IMPRESSION: I  met today with patient and her husband.  Attempted to establish therapeutic rapport.  We discussed the role in nature of palliative care and following as patient received concurrent treatment.  Patient seems to have a reasonable understanding of her current medical problems and recent disease progression.  She says she remains hopeful that treatment will be effective.  However, she verbalized an understanding that the cancer will ultimately be life limiting.  We discussed her fears associated with the disease.  She was particularly concerned about the future possibility of intestinal obstruction related to the malignancy.  Symptomatically, patient says she feels she is doing reasonably well.  She has had persistent fatigue associated with her anemia.  She felt markedly improved following the last transfusion but still continues to experience intermittent weakness.  However, patient says she is fully independent with her own care.  She is able to bathe and dress herself and do other chores around the house.  Her husband is available to assist when needed.  Patient has intermittent constipation, which is mostly relieved with daily use of MiraLAX.  However, patient does take occasional lactulose when the MiraLAX is ineffective.  Patient has some peripheral neuropathy.  She says this is not overly painful but rather annoying, particularly when she is trying to sleep.  Her husband gives her foot rubs at night, which helps.  We talked about medication but patient was not keen on starting something new.  She has used capsaicin in the past and said it helped.  She says she is going to try it again.  She has occasional pelvic and lower abdominal pain associated with cancer.  She says as needed acetaminophen alleviates her pain.  We discussed her family support.  She describes having a good support system.  She understandably feels down at times when she thinks about the cancer and her future.  However, she says  she is focused now on her grandchildren and family.  We talked about focusing on symptoms and ensuring her quality of life is maximized.  She is expecting the birth of a new grandson any day.  Patient does have advance directives on file.  Her husband would be her decision-maker if needed.  She has a living  will.  PLAN: Medical treatment and plan as per oncology May try capsaicin cream for neuropathy Acetaminophen as needed for pain Continue bowel regimen ACP on file We will plan to complete MOST Form during a future visit RTC in 2-3 weeks   Patient expressed understanding and was in agreement with this plan. She also understands that She can call clinic at any time with any questions, concerns, or complaints.    Time Total: 30 minutes  Visit consisted of counseling and education dealing with the complex and emotionally intense issues of symptom management and palliative care in the setting of serious and potentially life-threatening illness.Greater than 50%  of this time was spent counseling and coordinating care related to the above assessment and plan.  Signed by: Altha Harm, PhD, NP-C [phone] (Work Cell)

## 2018-08-08 ENCOUNTER — Inpatient Hospital Stay: Payer: PPO

## 2018-08-08 ENCOUNTER — Inpatient Hospital Stay (HOSPITAL_BASED_OUTPATIENT_CLINIC_OR_DEPARTMENT_OTHER): Payer: PPO | Admitting: Internal Medicine

## 2018-08-08 VITALS — BP 121/77 | HR 84 | Temp 97.6°F | Resp 16 | Wt 190.4 lb

## 2018-08-08 DIAGNOSIS — D6481 Anemia due to antineoplastic chemotherapy: Secondary | ICD-10-CM

## 2018-08-08 DIAGNOSIS — R531 Weakness: Secondary | ICD-10-CM | POA: Diagnosis not present

## 2018-08-08 DIAGNOSIS — T451X5S Adverse effect of antineoplastic and immunosuppressive drugs, sequela: Secondary | ICD-10-CM | POA: Diagnosis not present

## 2018-08-08 DIAGNOSIS — Z90710 Acquired absence of both cervix and uterus: Secondary | ICD-10-CM | POA: Diagnosis not present

## 2018-08-08 DIAGNOSIS — I10 Essential (primary) hypertension: Secondary | ICD-10-CM

## 2018-08-08 DIAGNOSIS — G893 Neoplasm related pain (acute) (chronic): Secondary | ICD-10-CM

## 2018-08-08 DIAGNOSIS — Z7189 Other specified counseling: Secondary | ICD-10-CM

## 2018-08-08 DIAGNOSIS — E785 Hyperlipidemia, unspecified: Secondary | ICD-10-CM

## 2018-08-08 DIAGNOSIS — Z853 Personal history of malignant neoplasm of breast: Secondary | ICD-10-CM

## 2018-08-08 DIAGNOSIS — C786 Secondary malignant neoplasm of retroperitoneum and peritoneum: Secondary | ICD-10-CM

## 2018-08-08 DIAGNOSIS — C569 Malignant neoplasm of unspecified ovary: Secondary | ICD-10-CM

## 2018-08-08 DIAGNOSIS — K5900 Constipation, unspecified: Secondary | ICD-10-CM | POA: Diagnosis not present

## 2018-08-08 DIAGNOSIS — Z79899 Other long term (current) drug therapy: Secondary | ICD-10-CM

## 2018-08-08 DIAGNOSIS — E039 Hypothyroidism, unspecified: Secondary | ICD-10-CM

## 2018-08-08 DIAGNOSIS — I2510 Atherosclerotic heart disease of native coronary artery without angina pectoris: Secondary | ICD-10-CM

## 2018-08-08 DIAGNOSIS — Z90722 Acquired absence of ovaries, bilateral: Secondary | ICD-10-CM

## 2018-08-08 DIAGNOSIS — K219 Gastro-esophageal reflux disease without esophagitis: Secondary | ICD-10-CM

## 2018-08-08 DIAGNOSIS — R530 Neoplastic (malignant) related fatigue: Secondary | ICD-10-CM | POA: Diagnosis not present

## 2018-08-08 DIAGNOSIS — R5383 Other fatigue: Secondary | ICD-10-CM | POA: Diagnosis not present

## 2018-08-08 DIAGNOSIS — R5381 Other malaise: Secondary | ICD-10-CM | POA: Diagnosis not present

## 2018-08-08 DIAGNOSIS — Z7982 Long term (current) use of aspirin: Secondary | ICD-10-CM

## 2018-08-08 DIAGNOSIS — G629 Polyneuropathy, unspecified: Secondary | ICD-10-CM

## 2018-08-08 DIAGNOSIS — Z5111 Encounter for antineoplastic chemotherapy: Secondary | ICD-10-CM | POA: Diagnosis not present

## 2018-08-08 DIAGNOSIS — I252 Old myocardial infarction: Secondary | ICD-10-CM

## 2018-08-08 LAB — CBC WITH DIFFERENTIAL/PLATELET
Abs Immature Granulocytes: 0.02 10*3/uL (ref 0.00–0.07)
BASOS ABS: 0 10*3/uL (ref 0.0–0.1)
Basophils Relative: 0 %
Eosinophils Absolute: 0 10*3/uL (ref 0.0–0.5)
Eosinophils Relative: 0 %
HCT: 27.1 % — ABNORMAL LOW (ref 36.0–46.0)
Hemoglobin: 8.2 g/dL — ABNORMAL LOW (ref 12.0–15.0)
Immature Granulocytes: 1 %
Lymphocytes Relative: 37 %
Lymphs Abs: 1.5 10*3/uL (ref 0.7–4.0)
MCH: 28.2 pg (ref 26.0–34.0)
MCHC: 30.3 g/dL (ref 30.0–36.0)
MCV: 93.1 fL (ref 80.0–100.0)
Monocytes Absolute: 0.4 10*3/uL (ref 0.1–1.0)
Monocytes Relative: 10 %
NEUTROS ABS: 2.1 10*3/uL (ref 1.7–7.7)
Neutrophils Relative %: 52 %
Platelets: 233 10*3/uL (ref 150–400)
RBC: 2.91 MIL/uL — ABNORMAL LOW (ref 3.87–5.11)
RDW: 19 % — ABNORMAL HIGH (ref 11.5–15.5)
WBC: 4 10*3/uL (ref 4.0–10.5)
nRBC: 0.5 % — ABNORMAL HIGH (ref 0.0–0.2)

## 2018-08-08 LAB — COMPREHENSIVE METABOLIC PANEL
ALT: 13 U/L (ref 0–44)
AST: 23 U/L (ref 15–41)
Albumin: 3.3 g/dL — ABNORMAL LOW (ref 3.5–5.0)
Alkaline Phosphatase: 71 U/L (ref 38–126)
Anion gap: 8 (ref 5–15)
BUN: 16 mg/dL (ref 8–23)
CO2: 23 mmol/L (ref 22–32)
Calcium: 8.9 mg/dL (ref 8.9–10.3)
Chloride: 107 mmol/L (ref 98–111)
Creatinine, Ser: 0.9 mg/dL (ref 0.44–1.00)
GFR calc non Af Amer: >60 mL/min (ref >60)
Glucose, Bld: 109 mg/dL — ABNORMAL HIGH (ref 70–99)
Potassium: 3.7 mmol/L (ref 3.5–5.1)
SODIUM: 138 mmol/L (ref 135–145)
Total Bilirubin: 0.4 mg/dL (ref 0.3–1.2)
Total Protein: 6.7 g/dL (ref 6.5–8.1)

## 2018-08-08 MED ORDER — SODIUM CHLORIDE 0.9% FLUSH
10.0000 mL | Freq: Once | INTRAVENOUS | Status: AC
  Administered 2018-08-08: 10 mL via INTRAVENOUS
  Filled 2018-08-08: qty 10

## 2018-08-08 MED ORDER — ONDANSETRON HCL 4 MG/2ML IJ SOLN
8.0000 mg | Freq: Once | INTRAMUSCULAR | Status: AC
  Administered 2018-08-08: 8 mg via INTRAVENOUS
  Filled 2018-08-08: qty 4

## 2018-08-08 MED ORDER — SODIUM CHLORIDE 0.9 % IV SOLN
Freq: Once | INTRAVENOUS | Status: AC
  Administered 2018-08-08: 10:00:00 via INTRAVENOUS
  Filled 2018-08-08: qty 250

## 2018-08-08 MED ORDER — HEPARIN SOD (PORK) LOCK FLUSH 100 UNIT/ML IV SOLN
500.0000 [IU] | Freq: Once | INTRAVENOUS | Status: AC
  Administered 2018-08-08: 500 [IU] via INTRAVENOUS
  Filled 2018-08-08: qty 5

## 2018-08-08 MED ORDER — DEXAMETHASONE SODIUM PHOSPHATE 10 MG/ML IJ SOLN
10.0000 mg | Freq: Once | INTRAMUSCULAR | Status: AC
  Administered 2018-08-08: 10 mg via INTRAVENOUS
  Filled 2018-08-08: qty 1

## 2018-08-08 MED ORDER — SODIUM CHLORIDE 0.9 % IV SOLN
35.0000 mg/m2 | Freq: Once | INTRAVENOUS | Status: AC
  Administered 2018-08-08: 70 mg via INTRAVENOUS
  Filled 2018-08-08: qty 7

## 2018-08-08 NOTE — Assessment & Plan Note (Addendum)
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  Ca-125 continues to rise- 1400+.  Patient currently on weekly Taxotere.  #Proceed with cycle number 1 day 15 Taxotere today.  Labs today reviewed;  acceptable for treatment today--except for moderate anemia -8.3.   # Mod Anemia-sec to chemo- hemoglobin 8.3; STABLE. Monitor for now.   # PN- G-1-stable.   # DISPOSITION: # Treatment today;  # follow up in 2 week-MD-labs-cbc/cmp-ca-125/hold tube-Taxoetere-Dr.B

## 2018-08-08 NOTE — Progress Notes (Signed)
[place] OFFICE PROGRESS NOTE  Patient Care Team: Jerrol Banana., MD as PCP - General (Family Medicine) Gillis Ends, MD as Referring Physician (Obstetrics and Gynecology) Cammie Sickle, MD as Consulting Physician (Oncology) Corey Skains, MD as Consulting Physician (Cardiology) Marval Regal, NP as Nurse Practitioner (Nurse Practitioner) Manya Silvas, MD as Consulting Physician (Gastroenterology)  Cancer Staging Malignant neoplasm of ovary [facility]) Staging form: Ovary, AJCC 7th Edition - Clinical: Stage IIIC (T3c, N0, M0) - Unsigned    Oncology History   # April- MAY 2016- HIGH GRADE SEROUS OVARIAN CANCER STAGE IIIC; CA-125 +2300+;  s/p OPTIMAL DEBULKING SURGERY   # MAY 2016[name] [finished in Sep 19th 2017]  # MAY CT 2017- RECURRENT/Peritoneal carcinomatosis/pelvic implant ~1.2cm/Ca-125-34;   # July 3rd 2017- CARBO-DOXIL q 4W [with neulasta]; CT May 11 2016- slight increase peritoneal / stable nodules. Cont chemo [finished DEC 2017]. Last dose CARBO-07/11/2016; DEC 28th 2017 CT- Increase in peritoneal deposits; increasing Ca 125 [60s]- PLATINUM REFRACTORY  # Jan 2018- Taxol-Avastin x6 cycles; Aug 2018- CR; Aug 2018- start Avastin Maintenance; MARCH 2019- PROGRESSION- STOP avastin.   # MID April 2019- Zejula [300 mg/day]; 4 days- later STOPPED sec to Chest pain;  # June 1st week- Lynparza; AUG 2019- Progression of disease  # AUG, 21st 2019- START CARBO-GEM x4 cycles; NOV, 19th CT Progression/rising Ca-125.   #July 25, 2018- Taxotere weekly.    # May 2019- TAKASUBO ? 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  ---------------------------------------------------------------------    DIAGNOSIS: MAY 2017; RECURRENT OVARIAN CA-high-grade serous/platinum refractory  STAGE: IV    ;GOALS: PALLIATIVE  CURRENT/MOST RECENT THERAPY: Docetaxel weekly     Malignant neoplasm of ovary ([place])   04/08/2018 - 07/08/2018 Chemotherapy    The patient had palonosetron (ALOXI) injection 0.25 mg, 0.25 mg, Intravenous,  Once, 4 of 4 cycles Administration: 0.25 mg (04/09/2018), 0.25 mg (04/30/2018), 0.25 mg (06/04/2018), 0.25 mg (06/25/2018) pegfilgrastim-cbqv (UDENYCA) injection 6 mg, 6 mg, Subcutaneous, Once, 3 of 3 cycles Administration: 6 mg (05/01/2018), 6 mg (06/05/2018), 6 mg (06/26/2018) CARBOplatin (PARAPLATIN) 430 mg in sodium chloride 0.9 % 250 mL chemo infusion, 430 mg (100 % of original dose 430.5 mg), Intravenous,  Once, 4 of 4 cycles Dose modification:   (original dose 430.5 mg, Cycle 1) Administration: 430 mg (04/09/2018), 430 mg (04/30/2018), 430 mg (06/04/2018), 430 mg (06/25/2018) gemcitabine (GEMZAR) 2,000 mg in sodium chloride 0.9 % 250 mL chemo infusion, 1,976 mg, Intravenous,  Once, 4 of 4 cycles Administration: 2,000 mg (04/09/2018), 2,000 mg (04/30/2018), 2,000 mg (06/04/2018), 2,000 mg (06/25/2018)  for chemotherapy treatment.      Ovarian cancer, unspecified laterality ([place])   07/25/2018 -  Chemotherapy    The patient had DOCEtaxel (TAXOTERE) 50 mg in sodium chloride 0.9 % 150 mL chemo infusion, 25 mg/m2 = 50 mg, Intravenous,  Once, 1 of 4 cycles Dose modification: 35 mg/m2 (original dose 25 mg/m2, Cycle 1, Reason: Provider Judgment) Administration: 50 mg (07/25/2018), 70 mg (08/01/2018)  for chemotherapy treatment.        INTERVAL HISTORY:  Jamie Conner 77 y.o.  female pleasant patient above history of high-grade serous ovarian cancer-platinum  refractory currently on weekly Taxotere is here for follow-up.  Patient complains of mild fatigue however overall improved  in the last 2 days.  No diarrhea.  Intermittent constipation.  No abdominal pain.  No nausea no vomiting.  Continued mild hair loss.  No fevers or chills.  No skin rash.   Review of Systems  Constitutional: Positive for malaise/fatigue. Negative for chills, diaphoresis, fever and weight loss.  HENT: Negative for nosebleeds and sore throat.   Eyes: Negative for double vision.  Respiratory: Negative for cough, hemoptysis, sputum production, shortness of breath and wheezing.   Cardiovascular: Negative for chest pain, palpitations, orthopnea and leg swelling.  Gastrointestinal: Positive for constipation. Negative for abdominal pain, blood in stool, diarrhea, heartburn, melena, nausea and vomiting.  Genitourinary: Negative for dysuria, frequency and urgency.  Musculoskeletal: Negative for back pain and joint pain.  Skin: Negative.  Negative for itching and rash.  Neurological: Negative for dizziness, tingling, focal weakness, weakness and headaches.  Endo/Heme/Allergies: Does not bruise/bleed easily.  Psychiatric/Behavioral: Negative for depression. The patient is not nervous/anxious and does not have insomnia.       PAST MEDICAL HISTORY :  Past Medical History:  Diagnosis Date  . Atrial fibrillation ([place])   . Breast cancer ([place]) 1998  . Cancer of breast ([place]) 1998   Left/radiation  . CINV (chemotherapy-induced nausea and vomiting)   . GERD (gastroesophageal reflux disease)   . Hyperlipidemia   . Hypothyroidism   . Mitral insufficiency   . Ovarian cancer ([place])    s/p BSO optimal tumor debulking May 2016/chemo  . Personal history of chemotherapy since 2016   ovarian cancer  . Personal history of radiation therapy 1998  . PVC (premature ventricular contraction)     PAST SURGICAL HISTORY :   Past Surgical History:  Procedure Laterality Date  . ABDOMINAL HYSTERECTOMY    . BILATERAL SALPINGOOPHORECTOMY  May 2016   with optimal tumor debulking   . BREAST LUMPECTOMY Left 1998  .  BREAST SURGERY     Left  . CESAREAN SECTION     x2  . CHOLECYSTECTOMY    . COLONOSCOPY  2006  . DEBULKING N/A 12/22/2014   Procedure: DEBULKING;  Surgeon: Gillis Ends, MD;  Location: [facility] ORS;  Service: Gynecology;  Laterality: N/A;  . LAPAROTOMY N/A 12/22/2014   Procedure: EXPLORATORY LAPAROTOMY;  Surgeon: Robert Bellow, MD;  Location: [facility] ORS;  Service: General;  Laterality: N/A;  . LAPAROTOMY WITH STAGING N/A 12/22/2014   Procedure: LAPAROTOMY WITH STAGING;  Surgeon: Gillis Ends, MD;  Location: [facility] OR[facility];  Service: Gynecology;  Laterality: N/A;  . LEFT HEART CATH AND CORONARY ANGIOGRAPHY N/A 12/10/2017   Procedure: LEFT HEART CATH AND CORONARY ANGIOGRAPHY;  Surgeon: Yolonda Kida, MD;  Location: [place] [facility];  Service: Cardiovascular;  Laterality: N/A;  . PERIPHERAL VASCULAR CATHETERIZATION Left 12/22/2014   Procedure: PORTA CATH INSERTION;  Surgeon: Robert Bellow, MD;  Location: [facility];  Service: General;  Laterality: Left;  . PORTACATH PLACEMENT Right 12/22/2014   Procedure: INSERTION PORT-A-CATH;  Surgeon: Robert Bellow, MD;  Location: [facility] OR[facility];  Service: General;  Laterality: Right;  . SALPINGOOPHORECTOMY Bilateral 12/22/2014   Procedure: SALPINGO OOPHORECTOMY;  Surgeon: Gillis Ends, MD;  Location: [facility] OR[facility];  Service: Gynecology;  Laterality: Bilateral;  . UPPER GI ENDOSCOPY  09/25/04   hiatus hernia, schatzki ring and a single gastric polyp    FAMILY HISTORY :   Family History  Problem Relation Age of Onset  .  Cancer Mother        breast, throat, and stomach  . Breast cancer Mother   . Heart disease Father   . Pulmonary embolism Sister   . Cancer Brother 60       angiosarcoma of the chest; carcinoid small intestinal tumor  . Hypertension Brother   . Stroke Brother   . Cancer Maternal Aunt        breast cancer  . Cancer Maternal Grandfather 26       pancreatic    SOCIAL HISTORY:   Social History   Tobacco Use  . Smoking  status: Never Smoker  . Smokeless tobacco: Never Used  Substance Use Topics  . Alcohol use: No  . Drug use: No    ALLERGIES:  is allergic to carafate [sucralfate] and sulfa antibiotics.  MEDICATIONS:  Current Outpatient Medications  Medication Sig Dispense Refill  . acetaminophen (TYLENOL) 500 MG tablet Take 500 mg by mouth every 6 (six) hours as needed.    Marland Kitchen alum & mag hydroxide-simeth (MAALOX/MYLANTA) 200-200-20 MG/5ML suspension Take 15 mLs by mouth every 6 (six) hours as needed for indigestion or heartburn. 355 mL 0  . aspirin EC 325 MG tablet Take 1 tablet (325 mg total) by mouth daily. 100 tablet 3  . chlorhexidine (PERIDEX) 0.12 % solution     . Cholecalciferol (VITAMIN D) 2000 units tablet Take 1 tablet (2,000 Units total) by mouth daily. 30 tablet 12  . Docosanol (ABREVA EX) Apply 1 application topically as needed. Fever blisters    . fluticasone (FLONASE) 50 MCG/ACT nasal spray Place 1 spray into both nostrils daily as needed.     . furosemide (LASIX) 20 MG tablet Take 0.5 tablets (10 mg total) by mouth daily. 30 tablet 0  . lactulose (CHRONULAC) 10 GM/15ML solution Take 15 mLs (10 g total) by mouth daily as needed for moderate constipation. 236 mL 0  . levothyroxine (SYNTHROID, LEVOTHROID) 50 MCG tablet TAKE ONE TABLET ON AN EMPTY STOMACH WITHA GLASS OF WATER AT LEAST 30 TO 60 MINUTES BEFORE BREAKFAST 30 tablet 12  . lidocaine-prilocaine (EMLA) cream Apply to port area 30-45 mins prior to chemo. 30 g 5  . loratadine (CLARITIN) 10 MG tablet Take 10 mg by mouth daily as needed.     Marland Kitchen LORazepam (ATIVAN) 0.5 MG tablet TAKE ONE TABLET BY MOUTH AT BEDTIME AS NEEDED FOR ANXIETY/SLEEP 30 tablet 0  . Lysine 500 MG TABS Take 1 tablet by mouth daily as needed.     . metoprolol succinate (TOPROL-XL) 25 MG 24 hr tablet Take 1 tablet by mouth daily.    . Multiple Vitamin (MULTIVITAMIN) capsule Take 1 capsule by mouth daily.    . ondansetron (ZOFRAN) 4 MG tablet One pill 30-60 mins prior to  taking zejula to prevent nausea/vomitting. 45 tablet 3  . ondansetron (ZOFRAN) 8 MG tablet Take 1 tablet (8 mg total) by mouth every 8 (eight) hours as needed for nausea or vomiting. 20 tablet 4  . pantoprazole (PROTONIX) 40 MG tablet TAKE ONE TABLET EVERY DAY 30 tablet 1  . polyethylene glycol (MIRALAX / GLYCOLAX) packet Take 17 g by mouth daily as needed.    . prochlorperazine (COMPAZINE) 10 MG tablet Take 1 tablet (10 mg total) by mouth every 6 (six) hours as needed for nausea or vomiting. 30 tablet 4   No current facility-administered medications for this visit.    Facility-Administered Medications Ordered in Other Visits  Medication Dose Route Frequency Provider Last Rate Last Dose  .  heparin lock flush 100 unit/mL  500 Units Intravenous Once Cammie Sickle, MD        PHYSICAL EXAMINATION: ECOG PERFORMANCE STATUS: 1 - Symptomatic but completely ambulatory  BP 121/77 (BP Location: Left Arm, Patient Position: Sitting)   Pulse 84   Temp 97.6 F (36.4 C) (Tympanic)   Resp 16   Wt 190 lb 6.4 oz (86.4 kg)   BMI 32.68 kg/m   Filed Weights   08/08/18 0904  Weight: 190 lb 6.4 oz (86.4 kg)    Physical Exam  Constitutional: She is oriented to person, place, and time and well-developed, well-nourished, and in no distress.  Accompanied by husband.  She is walking by herself.  HENT:  Head: Normocephalic and atraumatic.  Mouth/Throat: Oropharynx is clear and moist. No oropharyngeal exudate.  Eyes: Pupils are equal, round, and reactive to light.  Neck: Normal range of motion. Neck supple.  Cardiovascular: Normal rate and regular rhythm.  Pulmonary/Chest: No respiratory distress. She has no wheezes.  Abdominal: Soft. Bowel sounds are normal. She exhibits no distension and no mass. There is no abdominal tenderness. There is no rebound and no guarding.  Musculoskeletal: Normal range of motion.        General: No tenderness or edema.  Neurological: She is alert and oriented to  person, place, and time.  Skin: Skin is warm.  Psychiatric: Affect normal.       LABORATORY DATA:  I have reviewed the data as listed    Component Value Date/Time   NA 138 08/08/2018 0846   NA 140 12/14/2014 1037   K 3.7 08/08/2018 0846   K 4.4 12/14/2014 1037   CL 107 08/08/2018 0846   CL 106 12/14/2014 1037   CO2 23 08/08/2018 0846   CO2 26 12/14/2014 1037   GLUCOSE 109 (H) 08/08/2018 0846   GLUCOSE 87 12/14/2014 1037   BUN 16 08/08/2018 0846   BUN 12 12/14/2014 1037   CREATININE 0.90 08/08/2018 0846   CREATININE 0.97 12/14/2014 1037   CALCIUM 8.9 08/08/2018 0846   CALCIUM 9.2 12/14/2014 1037   PROT 6.7 08/08/2018 0846   PROT 7.3 12/14/2014 1037   ALBUMIN 3.3 (L) 08/08/2018 0846   ALBUMIN 3.8 12/14/2014 1037   AST 23 08/08/2018 0846   AST 20 12/14/2014 1037   ALT 13 08/08/2018 0846   ALT 11 (L) 12/14/2014 1037   ALKPHOS 71 08/08/2018 0846   ALKPHOS 138 (H) 12/14/2014 1037   BILITOT 0.4 08/08/2018 0846   BILITOT 0.5 12/14/2014 1037   GFRNONAA >60 08/08/2018 0846   GFRNONAA 59 (L) 12/14/2014 1037   GFRAA >60 08/08/2018 0846   GFRAA >60 12/14/2014 1037    No results found for: SPEP, UPEP  Lab Results  Component Value Date   WBC 4.0 08/08/2018   NEUTROABS 2.1 08/08/2018   HGB 8.2 (L) 08/08/2018   HCT 27.1 (L) 08/08/2018   MCV 93.1 08/08/2018   PLT 233 08/08/2018      Chemistry      Component Value Date/Time   NA 138 08/08/2018 0846   NA 140 12/14/2014 1037   K 3.7 08/08/2018 0846   K 4.4 12/14/2014 1037   CL 107 08/08/2018 0846   CL 106 12/14/2014 1037   CO2 23 08/08/2018 0846   CO2 26 12/14/2014 1037   BUN 16 08/08/2018 0846   BUN 12 12/14/2014 1037   CREATININE 0.90 08/08/2018 0846   CREATININE 0.97 12/14/2014 1037   GLU 84 03/16/2014  Component Value Date/Time   CALCIUM 8.9 08/08/2018 0846   CALCIUM 9.2 12/14/2014 1037   ALKPHOS 71 08/08/2018 0846   ALKPHOS 138 (H) 12/14/2014 1037   AST 23 08/08/2018 0846   AST 20 12/14/2014 1037    ALT 13 08/08/2018 0846   ALT 11 (L) 12/14/2014 1037   BILITOT 0.4 08/08/2018 0846   BILITOT 0.5 12/14/2014 1037       RADIOGRAPHIC STUDIES: I have personally reviewed the radiological images as listed and agreed with the findings in the report. No results found.   ASSESSMENT & PLAN:  Ovarian cancer, unspecified laterality ([place]) # RECURRENT HIGH GRADE SEROUS ovarian cancer/platinum refractory November 18th-CT abdomen pelvis shows progression-peritoneal/serosal; but no ascites.  Ca-125 continues to rise- 1400+.  Patient currently on weekly Taxotere.  #Proceed with cycle number 1 day 15 Taxotere today.  Labs today reviewed;  acceptable for treatment today--except for moderate anemia -8.3.   # Mod Anemia-sec to chemo- hemoglobin 8.3; STABLE. Monitor for now.   # PN- G-1-stable.   # DISPOSITION: # Treatment today;  # follow up in 2 week-MD-labs-cbc/cmp-ca-125/hold tube-Taxoetere-Dr.B   No orders of the defined types were placed in this encounter.  All questions were answered. The patient knows to call the clinic with any problems, questions or concerns.      Cammie Sickle, MD 08/08/2018 9:54 AM

## 2018-08-09 LAB — CA 125: Cancer Antigen (CA) 125: 2405 U/mL — ABNORMAL HIGH (ref 0.0–38.1)

## 2018-08-11 ENCOUNTER — Other Ambulatory Visit: Payer: Self-pay

## 2018-08-11 ENCOUNTER — Inpatient Hospital Stay (HOSPITAL_BASED_OUTPATIENT_CLINIC_OR_DEPARTMENT_OTHER): Payer: PPO | Admitting: Oncology

## 2018-08-11 ENCOUNTER — Inpatient Hospital Stay: Payer: PPO

## 2018-08-11 ENCOUNTER — Telehealth: Payer: Self-pay | Admitting: *Deleted

## 2018-08-11 VITALS — BP 116/72 | HR 87 | Temp 96.8°F | Resp 18 | Wt 191.0 lb

## 2018-08-11 DIAGNOSIS — D039 Melanoma in situ, unspecified: Secondary | ICD-10-CM

## 2018-08-11 DIAGNOSIS — I252 Old myocardial infarction: Secondary | ICD-10-CM

## 2018-08-11 DIAGNOSIS — Z90722 Acquired absence of ovaries, bilateral: Secondary | ICD-10-CM

## 2018-08-11 DIAGNOSIS — I2510 Atherosclerotic heart disease of native coronary artery without angina pectoris: Secondary | ICD-10-CM

## 2018-08-11 DIAGNOSIS — I10 Essential (primary) hypertension: Secondary | ICD-10-CM

## 2018-08-11 DIAGNOSIS — C569 Malignant neoplasm of unspecified ovary: Secondary | ICD-10-CM

## 2018-08-11 DIAGNOSIS — R55 Syncope and collapse: Secondary | ICD-10-CM

## 2018-08-11 DIAGNOSIS — I480 Paroxysmal atrial fibrillation: Secondary | ICD-10-CM

## 2018-08-11 DIAGNOSIS — R5381 Other malaise: Secondary | ICD-10-CM | POA: Diagnosis not present

## 2018-08-11 DIAGNOSIS — D6481 Anemia due to antineoplastic chemotherapy: Secondary | ICD-10-CM

## 2018-08-11 DIAGNOSIS — Z5111 Encounter for antineoplastic chemotherapy: Secondary | ICD-10-CM | POA: Diagnosis not present

## 2018-08-11 DIAGNOSIS — K5900 Constipation, unspecified: Secondary | ICD-10-CM

## 2018-08-11 DIAGNOSIS — Z136 Encounter for screening for cardiovascular disorders: Secondary | ICD-10-CM | POA: Diagnosis not present

## 2018-08-11 DIAGNOSIS — Z90710 Acquired absence of both cervix and uterus: Secondary | ICD-10-CM | POA: Diagnosis not present

## 2018-08-11 DIAGNOSIS — K219 Gastro-esophageal reflux disease without esophagitis: Secondary | ICD-10-CM

## 2018-08-11 DIAGNOSIS — D649 Anemia, unspecified: Secondary | ICD-10-CM

## 2018-08-11 DIAGNOSIS — T451X5S Adverse effect of antineoplastic and immunosuppressive drugs, sequela: Secondary | ICD-10-CM

## 2018-08-11 DIAGNOSIS — C786 Secondary malignant neoplasm of retroperitoneum and peritoneum: Secondary | ICD-10-CM

## 2018-08-11 DIAGNOSIS — E785 Hyperlipidemia, unspecified: Secondary | ICD-10-CM

## 2018-08-11 DIAGNOSIS — Z5189 Encounter for other specified aftercare: Secondary | ICD-10-CM

## 2018-08-11 DIAGNOSIS — D696 Thrombocytopenia, unspecified: Secondary | ICD-10-CM | POA: Diagnosis not present

## 2018-08-11 DIAGNOSIS — R531 Weakness: Secondary | ICD-10-CM

## 2018-08-11 DIAGNOSIS — R5383 Other fatigue: Secondary | ICD-10-CM | POA: Diagnosis not present

## 2018-08-11 DIAGNOSIS — Z79899 Other long term (current) drug therapy: Secondary | ICD-10-CM

## 2018-08-11 DIAGNOSIS — M6281 Muscle weakness (generalized): Secondary | ICD-10-CM

## 2018-08-11 DIAGNOSIS — Z853 Personal history of malignant neoplasm of breast: Secondary | ICD-10-CM

## 2018-08-11 DIAGNOSIS — Z7982 Long term (current) use of aspirin: Secondary | ICD-10-CM

## 2018-08-11 DIAGNOSIS — G629 Polyneuropathy, unspecified: Secondary | ICD-10-CM

## 2018-08-11 LAB — CBC WITH DIFFERENTIAL/PLATELET
Abs Immature Granulocytes: 0 10*3/uL (ref 0.00–0.07)
Basophils Absolute: 0 10*3/uL (ref 0.0–0.1)
Basophils Relative: 0 %
Eosinophils Absolute: 0 10*3/uL (ref 0.0–0.5)
Eosinophils Relative: 0 %
HEMATOCRIT: 26.3 % — AB (ref 36.0–46.0)
Hemoglobin: 8 g/dL — ABNORMAL LOW (ref 12.0–15.0)
Immature Granulocytes: 0 %
LYMPHS ABS: 1.7 10*3/uL (ref 0.7–4.0)
Lymphocytes Relative: 67 %
MCH: 28.3 pg (ref 26.0–34.0)
MCHC: 30.4 g/dL (ref 30.0–36.0)
MCV: 92.9 fL (ref 80.0–100.0)
Monocytes Absolute: 0.2 10*3/uL (ref 0.1–1.0)
Monocytes Relative: 8 %
Neutro Abs: 0.6 10*3/uL — ABNORMAL LOW (ref 1.7–7.7)
Neutrophils Relative %: 25 %
Platelets: 206 10*3/uL (ref 150–400)
RBC: 2.83 MIL/uL — ABNORMAL LOW (ref 3.87–5.11)
RDW: 19.4 % — ABNORMAL HIGH (ref 11.5–15.5)
WBC: 2.5 10*3/uL — ABNORMAL LOW (ref 4.0–10.5)
nRBC: 0 % (ref 0.0–0.2)

## 2018-08-11 LAB — COMPREHENSIVE METABOLIC PANEL
ALT: 13 U/L (ref 0–44)
AST: 22 U/L (ref 15–41)
Albumin: 3.1 g/dL — ABNORMAL LOW (ref 3.5–5.0)
Alkaline Phosphatase: 67 U/L (ref 38–126)
Anion gap: 7 (ref 5–15)
BUN: 16 mg/dL (ref 8–23)
CO2: 26 mmol/L (ref 22–32)
Calcium: 8.7 mg/dL — ABNORMAL LOW (ref 8.9–10.3)
Chloride: 107 mmol/L (ref 98–111)
Creatinine, Ser: 1.01 mg/dL — ABNORMAL HIGH (ref 0.44–1.00)
GFR calc Af Amer: >60 mL/min (ref >60)
GFR calc non Af Amer: 54 mL/min — ABNORMAL LOW (ref >60)
Glucose, Bld: 116 mg/dL — ABNORMAL HIGH (ref 70–99)
POTASSIUM: 4 mmol/L (ref 3.5–5.1)
SODIUM: 140 mmol/L (ref 135–145)
Total Bilirubin: 0.5 mg/dL (ref 0.3–1.2)
Total Protein: 6.4 g/dL — ABNORMAL LOW (ref 6.5–8.1)

## 2018-08-11 LAB — SAMPLE TO BLOOD BANK

## 2018-08-11 NOTE — Progress Notes (Signed)
Patient states that she was feeling nauseous and dizzy yesterday while at home, walked into the living room sat on the couch and passed out.  Patients husband said it lasted about 10 seconds before she became coherent.  Patient says that she had a very large bowel movement after this incident. Patient has not been dizzy, nauseous or issues today.

## 2018-08-11 NOTE — Telephone Encounter (Signed)
Per Dr. Jacinto Reap - please have patient scheduled with Sonia Baller or Ander Purpura.

## 2018-08-11 NOTE — Telephone Encounter (Signed)
Call returned to patient she accepts appointment for 145 for lab and see NP afterwards

## 2018-08-11 NOTE — Progress Notes (Signed)
Symptom Management Consult note [facility]  [phone] Fax:([phone]) [phone]  Patient Care Team: Jerrol Banana., MD as PCP - General (Family Medicine) Gillis Ends, MD as Referring Physician (Obstetrics and Gynecology) Cammie Sickle, MD as Consulting Physician (Oncology) Corey Skains, MD as Consulting Physician (Cardiology) Marval Regal, NP as Nurse Practitioner (Nurse Practitioner) Manya Silvas, MD as Consulting Physician (Gastroenterology)   Name of the patient: Jamie Conner  [id number]  20-Sep-1940   Date of visit: 08/11/2018  Diagnosis: High-grade serous ovarian cancer stage III   Chief Complaint: Syncopal episode   Current Treatment: s/p cycle 1 day 15 Taxotere  Oncology History: Patient last seen by primary oncologist Dr. Rogue Bussing on 08/08/2018 prior to cycle 1 day 15 Taxotere.  At that visit she complained of mild fatigue, intermittent constipation, mild hair loss but overall felt well.  Labs were acceptable for treatment.  Noted to have moderate anemia with a hemoglobin of 8.3 secondary to chemotherapy.  Ca125 noted to slowly be trending up.  Most recent Ca 125 2405 (1425).   Oncology History   # April- MAY 2016- HIGH GRADE SEROUS OVARIAN CANCER STAGE IIIC; CA-125 +2300+;  s/p OPTIMAL DEBULKING SURGERY   # MAY 2016[name] [finished in Sep 19th 2017]  # MAY CT 2017- RECURRENT/Peritoneal carcinomatosis/pelvic implant ~1.2cm/Ca-125-34;   # July 3rd 2017- CARBO-DOXIL q 4W [with neulasta]; CT May 11 2016- slight increase peritoneal / stable nodules. Cont chemo [finished DEC 2017]. Last dose CARBO-07/11/2016; DEC 28th 2017 CT- Increase in peritoneal deposits; increasing Ca 125 [60s]- PLATINUM REFRACTORY  # Jan 2018- Taxol-Avastin x6 cycles; Aug 2018- CR; Aug 2018- start Avastin Maintenance; MARCH 2019- PROGRESSION- STOP avastin.   # MID April 2019- Zejula [300 mg/day]; 4 days- later STOPPED  sec to Chest pain;  # June 1st week- Lynparza; AUG 2019- Progression of disease  # AUG, 21st 2019- START CARBO-GEM x4 cycles; NOV, 19th CT Progression/rising Ca-125.   #July 25, 2018- Taxotere weekly.    # May 2019- TAKASUBO ? [s/p cardiac cath- Dr.Kowalski]  # G-1-2 hand foot syndrome-   # LEFT BREAST CA s/p Lumpec & RT s/p TAM   # Afib [cardiology]; JUNE 2017- MUGA 62%  Jan 2018- MOLECULAR TESTING- ATM DELETERIOUS HETEROZYGOUS mutation; TUMOR BRCA- NEG; Myriad genomic instability- NEGATIVE;   # NGS/Omniseq- BRCA-2 copy loss/ * [april2019]; NEG- BRCA Mutations/N-TRK/MSI-STABLE.  ---------------------------------------------------------------------    DIAGNOSIS: MAY 2017; RECURRENT OVARIAN CA-high-grade serous/platinum refractory  STAGE: IV    ;GOALS: PALLIATIVE  CURRENT/MOST RECENT THERAPY: Docetaxel weekly     Malignant neoplasm of ovary ([place])   04/08/2018 - 07/08/2018 Chemotherapy    The patient had palonosetron (ALOXI) injection 0.25 mg, 0.25 mg, Intravenous,  Once, 4 of 4 cycles Administration: 0.25 mg (04/09/2018), 0.25 mg (04/30/2018), 0.25 mg (06/04/2018), 0.25 mg (06/25/2018) pegfilgrastim-cbqv (UDENYCA) injection 6 mg, 6 mg, Subcutaneous, Once, 3 of 3 cycles Administration: 6 mg (05/01/2018), 6 mg (06/05/2018), 6 mg (06/26/2018) CARBOplatin (PARAPLATIN) 430 mg in sodium chloride 0.9 % 250 mL chemo infusion, 430 mg (100 % of original dose 430.5 mg), Intravenous,  Once, 4 of 4 cycles Dose modification:   (original dose 430.5 mg, Cycle 1) Administration: 430 mg (04/09/2018), 430 mg (04/30/2018), 430 mg (06/04/2018), 430 mg (06/25/2018) gemcitabine (GEMZAR) 2,000 mg in sodium chloride 0.9 % 250 mL chemo infusion, 1,976 mg, Intravenous,  Once, 4 of 4 cycles Administration: 2,000 mg (04/09/2018), 2,000 mg (04/30/2018), 2,000 mg (06/04/2018), 2,000 mg (06/25/2018)  for chemotherapy  treatment.      Ovarian cancer, unspecified laterality ([place])   07/25/2018 -  Chemotherapy    The  patient had DOCEtaxel (TAXOTERE) 50 mg in sodium chloride 0.9 % 150 mL chemo infusion, 25 mg/m2 = 50 mg, Intravenous,  Once, 1 of 4 cycles Dose modification: 35 mg/m2 (original dose 25 mg/m2, Cycle 1, Reason: Provider Judgment) Administration: 50 mg (07/25/2018), 70 mg (08/01/2018), 70 mg (08/08/2018)  for chemotherapy treatment.      Subjective Data:  ECOG: 1 - Symptomatic but completely ambulatory   Subjective:    Jamie Conner is a 77 y.o. female who presents for evaluation of syncope. Onset was 1 day ago. Symptoms have  completely resolved since that time. Patient describes the episode as a sudden loss of consciousness without warning and episode witnessed and the following was observed: incontinent of stool and mental status was normal immediately upon regaining consciousness. Associated symptoms: defacation, diarrhea and nausea. The patient denies abdominal pain, headache, melena and tachycardia/palpitations. Medications putting patient at risk for syncope: beta blockers.  The following portions of the patient's history were reviewed and updated as appropriate: allergies, current medications, past family history, past medical history, past social history, past surgical history and problem list.  Review of Systems A comprehensive review of systems was negative except for: Constitutional: positive for fatigue and malaise Cardiovascular: positive for fatigue Gastrointestinal: positive for constipation, diarrhea and nausea Musculoskeletal: positive for muscle weakness   Objective:    BP 116/72 (BP Location: Left Arm, Patient Position: Sitting) Comment: 102/67 standing  Pulse 87 Comment: 93 standing  Temp (!) 96.8 F (36 C) (Tympanic)   Resp 18   Wt 191 lb (86.6 kg)   SpO2 99%   BMI 32.79 kg/m  General appearance: alert, no distress, mildly obese and pale Lungs: clear to auscultation bilaterally Heart: regular rate and rhythm, S1, S2 normal, no murmur, click, rub or  gallop Abdomen: soft, non-tender; bowel sounds normal; no masses,  no organomegaly Neurologic: Alert and oriented X 3, normal strength and tone. Normal symmetric reflexes. Normal coordination and gait  Cardiographics ECG: normal sinus rhythm   Assessment:    Vasovagal syncope   Plan:    ECG. Lab per orders.    Ovarian Cancer; high grade: S/p cycle 1 day 15 Taxotere. CEA trending up. Most recent 19 (1425).   S/p several lines of therapy.  Initial diagnosis in April 2016. S/p optimal debulking surgery.  Began carbo/Taxol in May 2016 completing in September 2017.  Imaging from May 2017 revealed recurrent peritoneal carcinomatosis with rising CEA 125.  Started carbo/Doxil in July 2017.  Imaging from September 2017 showed slight increase in peritoneal disease burden with stable pulmonary nodules.  Imaging from December 2017 revealed increase in peritoneal deposits and further increase in Ca1 25.  Began Taxol/Avastin x6 cycles in January 2018 with maintenance Avastin beginning in August 2018.  Progression noted in March 2019. Avastin was stopped.  Began Zejula in April 2019 but discontinued d/t chest pain.  Started [name] in June 2019 until progression noted in August 2019.  August 2019 started carbo/gemcitabine x4 cycles until progression noted in November 2019 with rising Ca1 25.  Began weekly Taxotere in December 2019.   Vasovagal syncope: Unclear etiology.  She is on metoprolol-XL 25 mg daily and Lasix 10 mg as needed for edema.   She has been on this for years without issue.  States episode occurred, while she was cooking in her kitchen when she became nauseated. Began walking  to her couch to sit when she "blacked out".  Her husband states she was unresponsive for about 10 seconds until she "came back".  She immediately went to the bathroom and had a "large bowel movement".  Spoke with Dr. Rogue Bussing, and thought to be d/t orthostasis and or a vasovagal response.  She has no prior history of  syncope and has not had a problem since.  Patient has a history of atrial fibrillation.  Patient instructed to keep close eye on blood pressure and to stay hydrated.  Instructed if symptoms persist or she develops chest pain to call clinic or present directly to emergency room.   Has history of STEMI April 2019.  Had cath with no significant blockage of coronary arteries.  Echo revealed an EF of 30 to 35%.  Elevated troponin's.  Consistent with Takutsubo cardiomyopathy.  Discharged on aspirin and statin.  Plan: Orthostatics.  Mild orthostasis.  Instructed to stay hydrated. Stat labs.  Anemia.  Hemoglobin 8.0.  May require blood transfusion this week especially if she is symptomatic.  May transfuse for hemoglobin less than 8 per Dr. Rogue Bussing.  Appointment scheduled for Friday. Stat EKG. normal sinus rhythm.  History of paroxysmal atrial fibrillation and STEMI with elevated troponins. Controlled with metoprolol. Possible IV fluids.  Mild orthostasis.  Recommend IV fluids but patient refused.  Patient will return to clinic on 08/15/2018 for labs with possible blood transfusion.   Greater than 50% was spent in counseling and coordination of care with this patient including but not limited to discussion of the relevant topics above (See A&P) including, but not limited to diagnosis and management of acute and chronic medical conditions.   Faythe Casa, NP 08/12/2018 8:25 AM

## 2018-08-11 NOTE — Progress Notes (Signed)
Error

## 2018-08-11 NOTE — Telephone Encounter (Addendum)
Patient called reporting that she passed out yesterday , is tired and thinks her hgb is low. She was here Friday and it was 8.2. Please advise if you want her seen in Symptom Management Clinic or just come in for labs.

## 2018-08-12 ENCOUNTER — Other Ambulatory Visit: Payer: Self-pay | Admitting: Oncology

## 2018-08-12 DIAGNOSIS — D649 Anemia, unspecified: Secondary | ICD-10-CM

## 2018-08-12 NOTE — Progress Notes (Signed)
Blood orders placed for Friday.   Faythe Casa, NP 08/12/2018 9:29 AM

## 2018-08-15 ENCOUNTER — Inpatient Hospital Stay: Payer: PPO | Admitting: Hospice and Palliative Medicine

## 2018-08-15 ENCOUNTER — Inpatient Hospital Stay: Payer: PPO

## 2018-08-15 ENCOUNTER — Other Ambulatory Visit: Payer: Self-pay | Admitting: *Deleted

## 2018-08-15 DIAGNOSIS — D649 Anemia, unspecified: Secondary | ICD-10-CM

## 2018-08-15 DIAGNOSIS — C569 Malignant neoplasm of unspecified ovary: Secondary | ICD-10-CM

## 2018-08-15 DIAGNOSIS — Z5111 Encounter for antineoplastic chemotherapy: Secondary | ICD-10-CM | POA: Diagnosis not present

## 2018-08-15 LAB — CBC WITH DIFFERENTIAL/PLATELET
ABS IMMATURE GRANULOCYTES: 0.01 10*3/uL (ref 0.00–0.07)
Basophils Absolute: 0 10*3/uL (ref 0.0–0.1)
Basophils Relative: 0 %
Eosinophils Absolute: 0 10*3/uL (ref 0.0–0.5)
Eosinophils Relative: 0 %
HCT: 26.2 % — ABNORMAL LOW (ref 36.0–46.0)
HEMOGLOBIN: 7.9 g/dL — AB (ref 12.0–15.0)
Immature Granulocytes: 0 %
Lymphocytes Relative: 54 %
Lymphs Abs: 1.5 10*3/uL (ref 0.7–4.0)
MCH: 28.1 pg (ref 26.0–34.0)
MCHC: 30.2 g/dL (ref 30.0–36.0)
MCV: 93.2 fL (ref 80.0–100.0)
Monocytes Absolute: 0.4 10*3/uL (ref 0.1–1.0)
Monocytes Relative: 12 %
NEUTROS ABS: 1 10*3/uL — AB (ref 1.7–7.7)
NEUTROS PCT: 34 %
Platelets: 230 10*3/uL (ref 150–400)
RBC: 2.81 MIL/uL — ABNORMAL LOW (ref 3.87–5.11)
RDW: 19.6 % — ABNORMAL HIGH (ref 11.5–15.5)
WBC: 2.9 10*3/uL — ABNORMAL LOW (ref 4.0–10.5)
nRBC: 0.7 % — ABNORMAL HIGH (ref 0.0–0.2)

## 2018-08-15 LAB — SAMPLE TO BLOOD BANK

## 2018-08-15 LAB — PREPARE RBC (CROSSMATCH)

## 2018-08-15 MED ORDER — ACETAMINOPHEN 325 MG PO TABS
650.0000 mg | ORAL_TABLET | Freq: Once | ORAL | Status: AC
  Administered 2018-08-15: 650 mg via ORAL
  Filled 2018-08-15: qty 2

## 2018-08-15 MED ORDER — DIPHENHYDRAMINE HCL 25 MG PO CAPS
25.0000 mg | ORAL_CAPSULE | Freq: Once | ORAL | Status: AC
  Administered 2018-08-15: 25 mg via ORAL
  Filled 2018-08-15: qty 1

## 2018-08-15 MED ORDER — SODIUM CHLORIDE 0.9% IV SOLUTION
250.0000 mL | Freq: Once | INTRAVENOUS | Status: AC
  Administered 2018-08-15: 250 mL via INTRAVENOUS
  Filled 2018-08-15: qty 250

## 2018-08-16 LAB — TYPE AND SCREEN
ABO/RH(D): O POS
Antibody Screen: NEGATIVE
Unit division: 0

## 2018-08-16 LAB — BPAM RBC
Blood Product Expiration Date: 202001172359
ISSUE DATE / TIME: 201912271030
UNIT TYPE AND RH: 5100

## 2018-08-18 DIAGNOSIS — I480 Paroxysmal atrial fibrillation: Secondary | ICD-10-CM | POA: Diagnosis not present

## 2018-08-18 DIAGNOSIS — I10 Essential (primary) hypertension: Secondary | ICD-10-CM | POA: Diagnosis not present

## 2018-08-18 DIAGNOSIS — I214 Non-ST elevation (NSTEMI) myocardial infarction: Secondary | ICD-10-CM | POA: Diagnosis not present

## 2018-08-18 DIAGNOSIS — R55 Syncope and collapse: Secondary | ICD-10-CM | POA: Diagnosis not present

## 2018-08-21 ENCOUNTER — Other Ambulatory Visit: Payer: Self-pay | Admitting: Internal Medicine

## 2018-08-21 DIAGNOSIS — C569 Malignant neoplasm of unspecified ovary: Secondary | ICD-10-CM

## 2018-08-22 ENCOUNTER — Inpatient Hospital Stay: Payer: PPO | Admitting: Internal Medicine

## 2018-08-22 ENCOUNTER — Other Ambulatory Visit: Payer: Self-pay

## 2018-08-22 ENCOUNTER — Inpatient Hospital Stay: Payer: PPO | Attending: Internal Medicine

## 2018-08-22 ENCOUNTER — Encounter: Payer: Self-pay | Admitting: Internal Medicine

## 2018-08-22 ENCOUNTER — Inpatient Hospital Stay: Payer: PPO

## 2018-08-22 VITALS — BP 113/76 | HR 77 | Temp 97.2°F | Resp 20

## 2018-08-22 DIAGNOSIS — K5900 Constipation, unspecified: Secondary | ICD-10-CM

## 2018-08-22 DIAGNOSIS — R5381 Other malaise: Secondary | ICD-10-CM | POA: Insufficient documentation

## 2018-08-22 DIAGNOSIS — T451X5S Adverse effect of antineoplastic and immunosuppressive drugs, sequela: Secondary | ICD-10-CM | POA: Insufficient documentation

## 2018-08-22 DIAGNOSIS — R531 Weakness: Secondary | ICD-10-CM | POA: Insufficient documentation

## 2018-08-22 DIAGNOSIS — Z515 Encounter for palliative care: Secondary | ICD-10-CM | POA: Insufficient documentation

## 2018-08-22 DIAGNOSIS — C786 Secondary malignant neoplasm of retroperitoneum and peritoneum: Secondary | ICD-10-CM

## 2018-08-22 DIAGNOSIS — Z803 Family history of malignant neoplasm of breast: Secondary | ICD-10-CM

## 2018-08-22 DIAGNOSIS — I480 Paroxysmal atrial fibrillation: Secondary | ICD-10-CM | POA: Insufficient documentation

## 2018-08-22 DIAGNOSIS — C569 Malignant neoplasm of unspecified ovary: Secondary | ICD-10-CM | POA: Diagnosis not present

## 2018-08-22 DIAGNOSIS — Z79899 Other long term (current) drug therapy: Secondary | ICD-10-CM

## 2018-08-22 DIAGNOSIS — D649 Anemia, unspecified: Secondary | ICD-10-CM | POA: Diagnosis not present

## 2018-08-22 DIAGNOSIS — I2510 Atherosclerotic heart disease of native coronary artery without angina pectoris: Secondary | ICD-10-CM | POA: Diagnosis not present

## 2018-08-22 DIAGNOSIS — E785 Hyperlipidemia, unspecified: Secondary | ICD-10-CM | POA: Insufficient documentation

## 2018-08-22 DIAGNOSIS — Z7982 Long term (current) use of aspirin: Secondary | ICD-10-CM | POA: Insufficient documentation

## 2018-08-22 DIAGNOSIS — Z66 Do not resuscitate: Secondary | ICD-10-CM | POA: Insufficient documentation

## 2018-08-22 DIAGNOSIS — Z5111 Encounter for antineoplastic chemotherapy: Secondary | ICD-10-CM | POA: Diagnosis not present

## 2018-08-22 DIAGNOSIS — K219 Gastro-esophageal reflux disease without esophagitis: Secondary | ICD-10-CM

## 2018-08-22 DIAGNOSIS — Z853 Personal history of malignant neoplasm of breast: Secondary | ICD-10-CM

## 2018-08-22 DIAGNOSIS — Z9221 Personal history of antineoplastic chemotherapy: Secondary | ICD-10-CM | POA: Diagnosis not present

## 2018-08-22 DIAGNOSIS — R5383 Other fatigue: Secondary | ICD-10-CM | POA: Diagnosis not present

## 2018-08-22 DIAGNOSIS — E039 Hypothyroidism, unspecified: Secondary | ICD-10-CM

## 2018-08-22 DIAGNOSIS — Z90722 Acquired absence of ovaries, bilateral: Secondary | ICD-10-CM

## 2018-08-22 DIAGNOSIS — G620 Drug-induced polyneuropathy: Secondary | ICD-10-CM | POA: Diagnosis not present

## 2018-08-22 DIAGNOSIS — Z923 Personal history of irradiation: Secondary | ICD-10-CM | POA: Diagnosis not present

## 2018-08-22 DIAGNOSIS — Z90710 Acquired absence of both cervix and uterus: Secondary | ICD-10-CM | POA: Insufficient documentation

## 2018-08-22 DIAGNOSIS — Z800 Family history of malignant neoplasm of digestive organs: Secondary | ICD-10-CM

## 2018-08-22 DIAGNOSIS — Z7189 Other specified counseling: Secondary | ICD-10-CM

## 2018-08-22 DIAGNOSIS — I252 Old myocardial infarction: Secondary | ICD-10-CM | POA: Diagnosis not present

## 2018-08-22 LAB — COMPREHENSIVE METABOLIC PANEL
ALT: 13 U/L (ref 0–44)
AST: 26 U/L (ref 15–41)
Albumin: 3.3 g/dL — ABNORMAL LOW (ref 3.5–5.0)
Alkaline Phosphatase: 77 U/L (ref 38–126)
Anion gap: 8 (ref 5–15)
BUN: 14 mg/dL (ref 8–23)
CO2: 25 mmol/L (ref 22–32)
Calcium: 9 mg/dL (ref 8.9–10.3)
Chloride: 108 mmol/L (ref 98–111)
Creatinine, Ser: 0.97 mg/dL (ref 0.44–1.00)
GFR calc Af Amer: >60 mL/min (ref >60)
GFR, EST NON AFRICAN AMERICAN: 57 mL/min — AB (ref >60)
Glucose, Bld: 117 mg/dL — ABNORMAL HIGH (ref 70–99)
Potassium: 3.6 mmol/L (ref 3.5–5.1)
Sodium: 141 mmol/L (ref 135–145)
Total Bilirubin: 0.5 mg/dL (ref 0.3–1.2)
Total Protein: 6.6 g/dL (ref 6.5–8.1)

## 2018-08-22 LAB — CBC WITH DIFFERENTIAL/PLATELET
Abs Immature Granulocytes: 0.02 10*3/uL (ref 0.00–0.07)
Basophils Absolute: 0 10*3/uL (ref 0.0–0.1)
Basophils Relative: 0 %
Eosinophils Absolute: 0 10*3/uL (ref 0.0–0.5)
Eosinophils Relative: 0 %
HCT: 32.2 % — ABNORMAL LOW (ref 36.0–46.0)
HEMOGLOBIN: 9.7 g/dL — AB (ref 12.0–15.0)
Immature Granulocytes: 0 %
LYMPHS PCT: 33 %
Lymphs Abs: 1.5 10*3/uL (ref 0.7–4.0)
MCH: 27.2 pg (ref 26.0–34.0)
MCHC: 30.1 g/dL (ref 30.0–36.0)
MCV: 90.2 fL (ref 80.0–100.0)
Monocytes Absolute: 0.7 10*3/uL (ref 0.1–1.0)
Monocytes Relative: 15 %
Neutro Abs: 2.3 10*3/uL (ref 1.7–7.7)
Neutrophils Relative %: 52 %
Platelets: 267 10*3/uL (ref 150–400)
RBC: 3.57 MIL/uL — AB (ref 3.87–5.11)
RDW: 18.5 % — ABNORMAL HIGH (ref 11.5–15.5)
WBC: 4.5 10*3/uL (ref 4.0–10.5)
nRBC: 0 % (ref 0.0–0.2)

## 2018-08-22 LAB — SAMPLE TO BLOOD BANK

## 2018-08-22 MED ORDER — SODIUM CHLORIDE 0.9% FLUSH
10.0000 mL | Freq: Once | INTRAVENOUS | Status: AC
  Administered 2018-08-22: 10 mL via INTRAVENOUS
  Filled 2018-08-22: qty 10

## 2018-08-22 MED ORDER — DEXAMETHASONE SODIUM PHOSPHATE 10 MG/ML IJ SOLN
10.0000 mg | Freq: Once | INTRAMUSCULAR | Status: AC
  Administered 2018-08-22: 10 mg via INTRAVENOUS
  Filled 2018-08-22: qty 1

## 2018-08-22 MED ORDER — SODIUM CHLORIDE 0.9 % IV SOLN
35.0000 mg/m2 | Freq: Once | INTRAVENOUS | Status: AC
  Administered 2018-08-22: 70 mg via INTRAVENOUS
  Filled 2018-08-22: qty 7

## 2018-08-22 MED ORDER — ONDANSETRON HCL 4 MG/2ML IJ SOLN
8.0000 mg | Freq: Once | INTRAMUSCULAR | Status: AC
  Administered 2018-08-22: 8 mg via INTRAVENOUS
  Filled 2018-08-22: qty 4

## 2018-08-22 MED ORDER — HEPARIN SOD (PORK) LOCK FLUSH 100 UNIT/ML IV SOLN
500.0000 [IU] | Freq: Once | INTRAVENOUS | Status: AC
  Administered 2018-08-22: 500 [IU] via INTRAVENOUS
  Filled 2018-08-22: qty 5

## 2018-08-22 MED ORDER — SODIUM CHLORIDE 0.9 % IV SOLN
Freq: Once | INTRAVENOUS | Status: AC
  Administered 2018-08-22: 11:00:00 via INTRAVENOUS
  Filled 2018-08-22: qty 250

## 2018-08-22 NOTE — Progress Notes (Signed)
[place] OFFICE PROGRESS NOTE  Patient Care Team: Jerrol Banana., MD as PCP - General (Family Medicine) Gillis Ends, MD as Referring Physician (Obstetrics and Gynecology) Cammie Sickle, MD as Consulting Physician (Oncology) Corey Skains, MD as Consulting Physician (Cardiology) Marval Regal, NP as Nurse Practitioner (Nurse Practitioner) Manya Silvas, MD as Consulting Physician (Gastroenterology)  Cancer Staging Malignant neoplasm of ovary [facility]) Staging form: Ovary, AJCC 7th Edition - Clinical: Stage IIIC (T3c, N0, M0) - Unsigned    Oncology History   # April- MAY 2016- HIGH GRADE SEROUS OVARIAN CANCER STAGE IIIC; CA-125 +2300+;  s/p OPTIMAL DEBULKING SURGERY   # MAY 2016[name] DD [finished in Sep 19th 2017]  # MAY CT 2017- RECURRENT/Peritoneal carcinomatosis/pelvic implant ~1.2cm/Ca-125-34;   # July 3rd 2017- CARBO-DOXIL q 4W [with neulasta]; CT May 11 2016- slight increase peritoneal / stable nodules. Cont chemo [finished DEC 2017]. Last dose CARBO-07/11/2016; DEC 28th 2017 CT- Increase in peritoneal deposits; increasing Ca 125 [60s]- PLATINUM REFRACTORY  # Jan 2018- Taxol-Avastin x6 cycles; Aug 2018- CR; Aug 2018- start Avastin Maintenance; MARCH 2019- PROGRESSION- STOP avastin.   # MID April 2019- Zejula [300 mg/day]; 4 days- later STOPPED sec to Chest pain;  # June 1st week- Lynparza; AUG 2019- Progression of disease  # AUG, 21st 2019- START CARBO-GEM x4 cycles; NOV, 19th CT Progression/rising Ca-125.   #July 25, 2018- Taxotere weekly.    # May 2019- TAKASUBO ? 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  ---------------------------------------------------------------------    DIAGNOSIS: MAY 2017; RECURRENT OVARIAN CA-high-grade serous/platinum refractory  STAGE: IV    ;GOALS: PALLIATIVE  CURRENT/MOST RECENT THERAPY: Docetaxel weekly     Malignant neoplasm of ovary ([place])   04/08/2018 - 07/08/2018 Chemotherapy    The patient had palonosetron (ALOXI) injection 0.25 mg, 0.25 mg, Intravenous,  Once, 4 of 4 cycles Administration: 0.25 mg (04/09/2018), 0.25 mg (04/30/2018), 0.25 mg (06/04/2018), 0.25 mg (06/25/2018) pegfilgrastim-cbqv (UDENYCA) injection 6 mg, 6 mg, Subcutaneous, Once, 3 of 3 cycles Administration: 6 mg (05/01/2018), 6 mg (06/05/2018), 6 mg (06/26/2018) CARBOplatin (PARAPLATIN) 430 mg in sodium chloride 0.9 % 250 mL chemo infusion, 430 mg (100 % of original dose 430.5 mg), Intravenous,  Once, 4 of 4 cycles Dose modification:   (original dose 430.5 mg, Cycle 1) Administration: 430 mg (04/09/2018), 430 mg (04/30/2018), 430 mg (06/04/2018), 430 mg (06/25/2018) gemcitabine (GEMZAR) 2,000 mg in sodium chloride 0.9 % 250 mL chemo infusion, 1,976 mg, Intravenous,  Once, 4 of 4 cycles Administration: 2,000 mg (04/09/2018), 2,000 mg (04/30/2018), 2,000 mg (06/04/2018), 2,000 mg (06/25/2018)  for chemotherapy treatment.      Ovarian cancer, unspecified laterality ([place])   07/25/2018 -  Chemotherapy    The patient had DOCEtaxel (TAXOTERE) 50 mg in sodium chloride 0.9 % 150 mL chemo infusion, 25 mg/m2 = 50 mg, Intravenous,  Once, 2 of 4 cycles Dose modification: 35 mg/m2 (original dose 25 mg/m2, Cycle 1, Reason: Provider Judgment) Administration: 50 mg (07/25/2018), 70 mg (08/01/2018), 70 mg (08/08/2018), 70 mg (08/22/2018)  for chemotherapy treatment.        INTERVAL HISTORY:  Jamie Conner 78 y.o.  female pleasant patient above  history of high-grade serous ovarian cancer-platinum refractory currently on weekly Taxotere is here for follow-up.  Patient mild episode  of abdominal cramping during the holidays current resolved.  No diarrhea no blood in stools.  Complains of mild to moderate fatigue.  Mild constipation.  Otherwise no diarrhea.  Chronic mild tingling and numbness in extremities.  Review of Systems  Constitutional: Positive for malaise/fatigue. Negative for chills, diaphoresis, fever and weight loss.  HENT: Negative for nosebleeds and sore throat.   Eyes: Negative for double vision.  Respiratory: Negative for cough, hemoptysis, sputum production, shortness of breath and wheezing.   Cardiovascular: Negative for chest pain, palpitations, orthopnea and leg swelling.  Gastrointestinal: Positive for constipation. Negative for abdominal pain, blood in stool, diarrhea, heartburn, melena, nausea and vomiting.  Genitourinary: Negative for dysuria, frequency and urgency.  Musculoskeletal: Negative for back pain and joint pain.  Skin: Negative.  Negative for itching and rash.  Neurological: Positive for tingling. Negative for dizziness, focal weakness, weakness and headaches.  Endo/Heme/Allergies: Does not bruise/bleed easily.  Psychiatric/Behavioral: Negative for depression. The patient is not nervous/anxious and does not have insomnia.       PAST MEDICAL HISTORY :  Past Medical History:  Diagnosis Date  . Atrial fibrillation ([place])   . Breast cancer ([place]) 1998  . Cancer of breast ([place]) 1998   Left/radiation  . CINV (chemotherapy-induced nausea and vomiting)   . GERD (gastroesophageal reflux disease)   . Hyperlipidemia   . Hypothyroidism   . Mitral insufficiency   . Ovarian cancer ([place])    s/p BSO optimal tumor debulking May 2016/chemo  . Personal history of chemotherapy since 2016   ovarian cancer  . Personal history of radiation therapy 1998  . PVC (premature ventricular contraction)     PAST SURGICAL HISTORY :   Past Surgical History:  Procedure Laterality Date  . ABDOMINAL HYSTERECTOMY    . BILATERAL SALPINGOOPHORECTOMY  May 2016    with optimal tumor debulking   . BREAST LUMPECTOMY Left 1998  . BREAST SURGERY     Left  . CESAREAN SECTION     x2  . CHOLECYSTECTOMY    . COLONOSCOPY  2006  . DEBULKING N/A 12/22/2014   Procedure: DEBULKING;  Surgeon: Gillis Ends, MD;  Location: [facility] ORS;  Service: Gynecology;  Laterality: N/A;  . LAPAROTOMY N/A 12/22/2014   Procedure: EXPLORATORY LAPAROTOMY;  Surgeon: Robert Bellow, MD;  Location: [facility] ORS;  Service: General;  Laterality: N/A;  . LAPAROTOMY WITH STAGING N/A 12/22/2014   Procedure: LAPAROTOMY WITH STAGING;  Surgeon: Gillis Ends, MD;  Location: [facility] ORS;  Service: Gynecology;  Laterality: N/A;  . LEFT HEART CATH AND CORONARY ANGIOGRAPHY N/A 12/10/2017   Procedure: LEFT HEART CATH AND CORONARY ANGIOGRAPHY;  Surgeon: Yolonda Kida, MD;  Location: [place] CV LAB;  Service: Cardiovascular;  Laterality: N/A;  . PERIPHERAL VASCULAR CATHETERIZATION Left 12/22/2014   Procedure: PORTA CATH INSERTION;  Surgeon: Robert Bellow, MD;  Location: [facility] OR[facility];  Service: General;  Laterality: Left;  . PORTACATH PLACEMENT Right 12/22/2014   Procedure: INSERTION PORT-A-CATH;  Surgeon: Robert Bellow, MD;  Location: [facility] OR[facility];  Service: General;  Laterality: Right;  . SALPINGOOPHORECTOMY Bilateral 12/22/2014   Procedure: SALPINGO OOPHORECTOMY;  Surgeon: Gillis Ends, MD;  Location: [facility] ORS;  Service: Gynecology;  Laterality: Bilateral;  . UPPER GI ENDOSCOPY  09/25/04   hiatus hernia, schatzki ring and a single gastric polyp    FAMILY HISTORY :   Family History  Problem Relation Age of Onset  . Cancer Mother        breast, throat, and stomach  . Breast cancer Mother   . Heart disease Father   . Pulmonary embolism Sister   . Cancer Brother 60       angiosarcoma of the chest; carcinoid small intestinal tumor  . Hypertension Brother   . Stroke Brother   . Cancer Maternal Aunt        breast cancer  . Cancer Maternal Grandfather 42       pancreatic     SOCIAL HISTORY:   Social History   Tobacco Use  . Smoking status: Never Smoker  . Smokeless tobacco: Never Used  Substance Use Topics  . Alcohol use: No  . Drug use: No    ALLERGIES:  is allergic to carafate [sucralfate] and sulfa antibiotics.  MEDICATIONS:  Current Outpatient Medications  Medication Sig Dispense Refill  . acetaminophen (TYLENOL) 500 MG tablet Take 500 mg by mouth every 6 (six) hours as needed.    Marland Kitchen alum & mag hydroxide-simeth (MAALOX/MYLANTA) 200-200-20 MG/5ML suspension Take 15 mLs by mouth every 6 (six) hours as needed for indigestion or heartburn. 355 mL 0  . aspirin EC 325 MG tablet Take 1 tablet (325 mg total) by mouth daily. 100 tablet 3  . chlorhexidine (PERIDEX) 0.12 % solution     . Cholecalciferol (VITAMIN D) 2000 units tablet Take 1 tablet (2,000 Units total) by mouth daily. 30 tablet 12  . fluticasone (FLONASE) 50 MCG/ACT nasal spray Place 1 spray into both nostrils daily as needed.     . lactulose (CHRONULAC) 10 GM/15ML solution Take 15 mLs (10 g total) by mouth daily as needed for moderate constipation. 236 mL 0  . levothyroxine (SYNTHROID, LEVOTHROID) 50 MCG tablet TAKE ONE TABLET ON AN EMPTY STOMACH WITHA GLASS OF WATER AT LEAST 30 TO 60 MINUTES BEFORE BREAKFAST 30 tablet 12  . lidocaine-prilocaine (EMLA) cream Apply to port area 30-45 mins prior to chemo. 30 g 5  . loratadine (CLARITIN) 10 MG tablet Take 10 mg by mouth daily as needed.     Marland Kitchen LORazepam (ATIVAN) 0.5 MG tablet TAKE ONE TABLET BY MOUTH AT BEDTIME AS NEEDED FOR ANXIETY/SLEEP 30 tablet 0  . Lysine 500 MG TABS Take 1 tablet by mouth daily as needed.     . metoprolol succinate (TOPROL-XL) 25 MG 24 hr tablet Take 1 tablet by mouth daily.    . Multiple Vitamin (MULTIVITAMIN) capsule Take 1 capsule by mouth daily.    . ondansetron (ZOFRAN) 8 MG tablet Take 1 tablet (8 mg total) by mouth every 8 (eight) hours as needed for nausea or vomiting. 20 tablet 4  . pantoprazole (PROTONIX) 40 MG  tablet TAKE ONE TABLET EVERY DAY 30 tablet 1  . polyethylene glycol (MIRALAX / GLYCOLAX) packet Take 17 g by mouth daily as needed.    . prochlorperazine (COMPAZINE) 10 MG tablet Take 1 tablet (10 mg total) by mouth every 6 (six) hours as needed for nausea or vomiting. 30 tablet 4  . Simethicone (GAS-X PO) Take 1 tablet by mouth daily.    . Docosanol (ABREVA EX) Apply 1 application topically as needed. Fever blisters    . furosemide (LASIX) 20 MG tablet Take 0.5 tablets (10 mg total) by mouth daily. (Patient not taking: Reported on 08/22/2018) 30 tablet 0  . ondansetron (ZOFRAN) 4 MG tablet One pill 30-60 mins prior to taking zejula to prevent nausea/vomitting. 45 tablet 3  No current facility-administered medications for this visit.     PHYSICAL EXAMINATION: ECOG PERFORMANCE STATUS: 1 - Symptomatic but completely ambulatory  BP 113/76   Pulse 77   Temp (!) 97.2 F (36.2 C) (Tympanic)   Resp 20   There were no vitals filed for this visit.  Physical Exam  Constitutional: She is oriented to person, place, and time and well-developed, well-nourished, and in no distress.  Accompanied by husband.  She is walking by herself.  HENT:  Head: Normocephalic and atraumatic.  Mouth/Throat: Oropharynx is clear and moist. No oropharyngeal exudate.  Eyes: Pupils are equal, round, and reactive to light.  Neck: Normal range of motion. Neck supple.  Cardiovascular: Normal rate and regular rhythm.  Pulmonary/Chest: No respiratory distress. She has no wheezes.  Abdominal: Soft. Bowel sounds are normal. She exhibits no distension and no mass. There is no abdominal tenderness. There is no rebound and no guarding.  Musculoskeletal: Normal range of motion.        General: No tenderness or edema.  Neurological: She is alert and oriented to person, place, and time.  Skin: Skin is warm.  Psychiatric: Affect normal.    LABORATORY DATA:  I have reviewed the data as listed    Component Value Date/Time    NA 141 08/22/2018 0942   NA 140 12/14/2014 1037   K 3.6 08/22/2018 0942   K 4.4 12/14/2014 1037   CL 108 08/22/2018 0942   CL 106 12/14/2014 1037   CO2 25 08/22/2018 0942   CO2 26 12/14/2014 1037   GLUCOSE 117 (H) 08/22/2018 0942   GLUCOSE 87 12/14/2014 1037   BUN 14 08/22/2018 0942   BUN 12 12/14/2014 1037   CREATININE 0.97 08/22/2018 0942   CREATININE 0.97 12/14/2014 1037   CALCIUM 9.0 08/22/2018 0942   CALCIUM 9.2 12/14/2014 1037   PROT 6.6 08/22/2018 0942   PROT 7.3 12/14/2014 1037   ALBUMIN 3.3 (L) 08/22/2018 0942   ALBUMIN 3.8 12/14/2014 1037   AST 26 08/22/2018 0942   AST 20 12/14/2014 1037   ALT 13 08/22/2018 0942   ALT 11 (L) 12/14/2014 1037   ALKPHOS 77 08/22/2018 0942   ALKPHOS 138 (H) 12/14/2014 1037   BILITOT 0.5 08/22/2018 0942   BILITOT 0.5 12/14/2014 1037   GFRNONAA 57 (L) 08/22/2018 0942   GFRNONAA 59 (L) 12/14/2014 1037   GFRAA >60 08/22/2018 0942   GFRAA >60 12/14/2014 1037    No results found for: SPEP, UPEP  Lab Results  Component Value Date   WBC 4.5 08/22/2018   NEUTROABS 2.3 08/22/2018   HGB 9.7 (L) 08/22/2018   HCT 32.2 (L) 08/22/2018   MCV 90.2 08/22/2018   PLT 267 08/22/2018      Chemistry      Component Value Date/Time   NA 141 08/22/2018 0942   NA 140 12/14/2014 1037   K 3.6 08/22/2018 0942   K 4.4 12/14/2014 1037   CL 108 08/22/2018 0942   CL 106 12/14/2014 1037   CO2 25 08/22/2018 0942   CO2 26 12/14/2014 1037   BUN 14 08/22/2018 0942   BUN 12 12/14/2014 1037   CREATININE 0.97 08/22/2018 0942   CREATININE 0.97 12/14/2014 1037   GLU 84 03/16/2014      Component Value Date/Time   CALCIUM 9.0 08/22/2018 0942   CALCIUM 9.2 12/14/2014 1037   ALKPHOS 77 08/22/2018 0942   ALKPHOS 138 (H) 12/14/2014 1037   AST 26 08/22/2018 0942   AST 20 12/14/2014 1037  ALT 13 08/22/2018 0942   ALT 11 (L) 12/14/2014 1037   BILITOT 0.5 08/22/2018 0942   BILITOT 0.5 12/14/2014 1037       RADIOGRAPHIC STUDIES: I have personally  reviewed the radiological images as listed and agreed with the findings in the report. No results found.   ASSESSMENT & PLAN:  Ovarian cancer, unspecified laterality ([place]) # RECURRENT HIGH GRADE SEROUS ovarian cancer/platinum refractory November 18th-CT abdomen pelvis shows progression-peritoneal/serosal; but no ascites.  Ca-125 continues to rise-2405.  patient currently on weekly Taxotere.  # proceed with cycle #2 day-1 today. Labs today reviewed;  acceptable for treatment today. Will order CT scan at next visit.  Reviewed with the patient and husband regarding rising CA 125;   # a abdominal cramps? Diet versus malignancy.  Currently resolved.  Monitor closely.  # severe anemia- s/p PRBC transfusion- today 9.7; stable.  # PN- G-1-stable  # DISPOSITION:  # Treatment today;  # 1 week- cbc/cmp/ca-125-HOLD tube.  # follow up in 2 week-MD-labs-cbc/cmp-ca-125/hold tube-Taxoetere-Dr.B    No orders of the defined types were placed in this encounter.  All questions were answered. The patient knows to call the clinic with any problems, questions or concerns.      Cammie Sickle, MD 08/26/2018 12:29 PM

## 2018-08-22 NOTE — Assessment & Plan Note (Addendum)
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  Ca-125 continues to rise-2405.  patient currently on weekly Taxotere.  # proceed with cycle #2 day-1 today. Labs today reviewed;  acceptable for treatment today. Will order CT scan at next visit.  Reviewed with the patient and husband regarding rising CA 125;   # a abdominal cramps? Diet versus malignancy.  Currently resolved.  Monitor closely.  # severe anemia- s/p PRBC transfusion- today 9.7; stable.  # PN- G-1-stable  # DISPOSITION:  # Treatment today;  # 1 week- cbc/cmp/ca-125-HOLD tube.  # follow up in 2 week-MD-labs-cbc/cmp-ca-125/hold tube-Taxoetere-Dr.B

## 2018-08-26 ENCOUNTER — Other Ambulatory Visit: Payer: Self-pay | Admitting: *Deleted

## 2018-08-26 DIAGNOSIS — C569 Malignant neoplasm of unspecified ovary: Secondary | ICD-10-CM

## 2018-08-29 ENCOUNTER — Inpatient Hospital Stay: Payer: PPO

## 2018-08-29 VITALS — BP 104/65 | HR 71 | Temp 97.7°F | Resp 20 | Wt 192.5 lb

## 2018-08-29 DIAGNOSIS — Z7189 Other specified counseling: Secondary | ICD-10-CM

## 2018-08-29 DIAGNOSIS — Z5111 Encounter for antineoplastic chemotherapy: Secondary | ICD-10-CM | POA: Diagnosis not present

## 2018-08-29 DIAGNOSIS — C569 Malignant neoplasm of unspecified ovary: Secondary | ICD-10-CM

## 2018-08-29 LAB — SAMPLE TO BLOOD BANK

## 2018-08-29 LAB — CBC WITH DIFFERENTIAL/PLATELET
Abs Immature Granulocytes: 0.03 10*3/uL (ref 0.00–0.07)
Basophils Absolute: 0 10*3/uL (ref 0.0–0.1)
Basophils Relative: 0 %
Eosinophils Absolute: 0 10*3/uL (ref 0.0–0.5)
Eosinophils Relative: 0 %
HCT: 30.8 % — ABNORMAL LOW (ref 36.0–46.0)
HEMOGLOBIN: 9.2 g/dL — AB (ref 12.0–15.0)
Immature Granulocytes: 1 %
Lymphocytes Relative: 37 %
Lymphs Abs: 1.9 10*3/uL (ref 0.7–4.0)
MCH: 26.8 pg (ref 26.0–34.0)
MCHC: 29.9 g/dL — ABNORMAL LOW (ref 30.0–36.0)
MCV: 89.8 fL (ref 80.0–100.0)
MONOS PCT: 9 %
Monocytes Absolute: 0.5 10*3/uL (ref 0.1–1.0)
Neutro Abs: 2.6 10*3/uL (ref 1.7–7.7)
Neutrophils Relative %: 53 %
Platelets: 277 10*3/uL (ref 150–400)
RBC: 3.43 MIL/uL — ABNORMAL LOW (ref 3.87–5.11)
RDW: 18.6 % — ABNORMAL HIGH (ref 11.5–15.5)
WBC: 5 10*3/uL (ref 4.0–10.5)
nRBC: 0.4 % — ABNORMAL HIGH (ref 0.0–0.2)

## 2018-08-29 LAB — COMPREHENSIVE METABOLIC PANEL
ALT: 13 U/L (ref 0–44)
AST: 22 U/L (ref 15–41)
Albumin: 3.1 g/dL — ABNORMAL LOW (ref 3.5–5.0)
Alkaline Phosphatase: 71 U/L (ref 38–126)
Anion gap: 7 (ref 5–15)
BILIRUBIN TOTAL: 0.4 mg/dL (ref 0.3–1.2)
BUN: 15 mg/dL (ref 8–23)
CO2: 26 mmol/L (ref 22–32)
Calcium: 8.8 mg/dL — ABNORMAL LOW (ref 8.9–10.3)
Chloride: 107 mmol/L (ref 98–111)
Creatinine, Ser: 1.13 mg/dL — ABNORMAL HIGH (ref 0.44–1.00)
GFR calc Af Amer: 55 mL/min — ABNORMAL LOW (ref >60)
GFR calc non Af Amer: 48 mL/min — ABNORMAL LOW (ref >60)
Glucose, Bld: 107 mg/dL — ABNORMAL HIGH (ref 70–99)
Potassium: 4.1 mmol/L (ref 3.5–5.1)
Sodium: 140 mmol/L (ref 135–145)
TOTAL PROTEIN: 6.3 g/dL — AB (ref 6.5–8.1)

## 2018-08-29 MED ORDER — DEXAMETHASONE SODIUM PHOSPHATE 10 MG/ML IJ SOLN
10.0000 mg | Freq: Once | INTRAMUSCULAR | Status: AC
  Administered 2018-08-29: 10 mg via INTRAVENOUS
  Filled 2018-08-29: qty 1

## 2018-08-29 MED ORDER — SODIUM CHLORIDE 0.9 % IV SOLN
Freq: Once | INTRAVENOUS | Status: AC
  Administered 2018-08-29: 11:00:00 via INTRAVENOUS
  Filled 2018-08-29: qty 250

## 2018-08-29 MED ORDER — HEPARIN SOD (PORK) LOCK FLUSH 100 UNIT/ML IV SOLN
500.0000 [IU] | Freq: Once | INTRAVENOUS | Status: AC | PRN
  Administered 2018-08-29: 500 [IU]
  Filled 2018-08-29: qty 5

## 2018-08-29 MED ORDER — ONDANSETRON HCL 4 MG/2ML IJ SOLN
8.0000 mg | Freq: Once | INTRAMUSCULAR | Status: AC
  Administered 2018-08-29: 8 mg via INTRAVENOUS
  Filled 2018-08-29: qty 4

## 2018-08-29 MED ORDER — SODIUM CHLORIDE 0.9 % IV SOLN
35.0000 mg/m2 | Freq: Once | INTRAVENOUS | Status: AC
  Administered 2018-08-29: 70 mg via INTRAVENOUS
  Filled 2018-08-29: qty 7

## 2018-09-01 LAB — CA 125: Cancer Antigen (CA) 125: 4722 U/mL — ABNORMAL HIGH (ref 0.0–38.1)

## 2018-09-03 ENCOUNTER — Telehealth: Payer: Self-pay | Admitting: Internal Medicine

## 2018-09-03 NOTE — Telephone Encounter (Signed)
Reviewed-rising CEA 125 with the patient.   Also discussed with Dr. Pamala Duffel could be a candidate for clinical trial at [facility] soon. Will discuss again at next appt.  Please cancel infusoin appt this Friday- on 1/17/ keep labs/MD appt as planned.   Dr.B

## 2018-09-05 ENCOUNTER — Other Ambulatory Visit: Payer: Self-pay

## 2018-09-05 ENCOUNTER — Inpatient Hospital Stay: Payer: PPO

## 2018-09-05 ENCOUNTER — Ambulatory Visit: Payer: PPO

## 2018-09-05 ENCOUNTER — Encounter: Payer: Self-pay | Admitting: Internal Medicine

## 2018-09-05 ENCOUNTER — Inpatient Hospital Stay: Payer: PPO | Admitting: Internal Medicine

## 2018-09-05 VITALS — BP 142/72 | HR 79 | Temp 97.0°F | Resp 16 | Ht 64.0 in | Wt 192.4 lb

## 2018-09-05 DIAGNOSIS — I480 Paroxysmal atrial fibrillation: Secondary | ICD-10-CM | POA: Diagnosis not present

## 2018-09-05 DIAGNOSIS — Z90722 Acquired absence of ovaries, bilateral: Secondary | ICD-10-CM

## 2018-09-05 DIAGNOSIS — Z9221 Personal history of antineoplastic chemotherapy: Secondary | ICD-10-CM

## 2018-09-05 DIAGNOSIS — Z853 Personal history of malignant neoplasm of breast: Secondary | ICD-10-CM

## 2018-09-05 DIAGNOSIS — E785 Hyperlipidemia, unspecified: Secondary | ICD-10-CM

## 2018-09-05 DIAGNOSIS — K5900 Constipation, unspecified: Secondary | ICD-10-CM

## 2018-09-05 DIAGNOSIS — Z923 Personal history of irradiation: Secondary | ICD-10-CM

## 2018-09-05 DIAGNOSIS — C786 Secondary malignant neoplasm of retroperitoneum and peritoneum: Secondary | ICD-10-CM

## 2018-09-05 DIAGNOSIS — Z90710 Acquired absence of both cervix and uterus: Secondary | ICD-10-CM | POA: Diagnosis not present

## 2018-09-05 DIAGNOSIS — T451X5S Adverse effect of antineoplastic and immunosuppressive drugs, sequela: Secondary | ICD-10-CM | POA: Diagnosis not present

## 2018-09-05 DIAGNOSIS — R5381 Other malaise: Secondary | ICD-10-CM | POA: Diagnosis not present

## 2018-09-05 DIAGNOSIS — C569 Malignant neoplasm of unspecified ovary: Secondary | ICD-10-CM | POA: Diagnosis not present

## 2018-09-05 DIAGNOSIS — D649 Anemia, unspecified: Secondary | ICD-10-CM | POA: Diagnosis not present

## 2018-09-05 DIAGNOSIS — Z7982 Long term (current) use of aspirin: Secondary | ICD-10-CM

## 2018-09-05 DIAGNOSIS — E039 Hypothyroidism, unspecified: Secondary | ICD-10-CM

## 2018-09-05 DIAGNOSIS — G620 Drug-induced polyneuropathy: Secondary | ICD-10-CM | POA: Diagnosis not present

## 2018-09-05 DIAGNOSIS — R5383 Other fatigue: Secondary | ICD-10-CM

## 2018-09-05 DIAGNOSIS — Z803 Family history of malignant neoplasm of breast: Secondary | ICD-10-CM

## 2018-09-05 DIAGNOSIS — K219 Gastro-esophageal reflux disease without esophagitis: Secondary | ICD-10-CM

## 2018-09-05 DIAGNOSIS — Z5111 Encounter for antineoplastic chemotherapy: Secondary | ICD-10-CM | POA: Diagnosis not present

## 2018-09-05 DIAGNOSIS — Z79899 Other long term (current) drug therapy: Secondary | ICD-10-CM

## 2018-09-05 DIAGNOSIS — Z800 Family history of malignant neoplasm of digestive organs: Secondary | ICD-10-CM

## 2018-09-05 LAB — COMPREHENSIVE METABOLIC PANEL
ALT: 13 U/L (ref 0–44)
AST: 26 U/L (ref 15–41)
Albumin: 3.1 g/dL — ABNORMAL LOW (ref 3.5–5.0)
Alkaline Phosphatase: 69 U/L (ref 38–126)
Anion gap: 7 (ref 5–15)
BUN: 15 mg/dL (ref 8–23)
CHLORIDE: 106 mmol/L (ref 98–111)
CO2: 26 mmol/L (ref 22–32)
Calcium: 9 mg/dL (ref 8.9–10.3)
Creatinine, Ser: 1.01 mg/dL — ABNORMAL HIGH (ref 0.44–1.00)
GFR calc Af Amer: >60 mL/min (ref >60)
GFR, EST NON AFRICAN AMERICAN: 54 mL/min — AB (ref >60)
Glucose, Bld: 107 mg/dL — ABNORMAL HIGH (ref 70–99)
Potassium: 4.1 mmol/L (ref 3.5–5.1)
Sodium: 139 mmol/L (ref 135–145)
Total Bilirubin: 0.5 mg/dL (ref 0.3–1.2)
Total Protein: 6.4 g/dL — ABNORMAL LOW (ref 6.5–8.1)

## 2018-09-05 LAB — CBC WITH DIFFERENTIAL/PLATELET
ABS IMMATURE GRANULOCYTES: 0.03 10*3/uL (ref 0.00–0.07)
Basophils Absolute: 0 10*3/uL (ref 0.0–0.1)
Basophils Relative: 0 %
Eosinophils Absolute: 0 10*3/uL (ref 0.0–0.5)
Eosinophils Relative: 0 %
HCT: 31.3 % — ABNORMAL LOW (ref 36.0–46.0)
Hemoglobin: 9.5 g/dL — ABNORMAL LOW (ref 12.0–15.0)
Immature Granulocytes: 1 %
Lymphocytes Relative: 46 %
Lymphs Abs: 2 10*3/uL (ref 0.7–4.0)
MCH: 27.3 pg (ref 26.0–34.0)
MCHC: 30.4 g/dL (ref 30.0–36.0)
MCV: 89.9 fL (ref 80.0–100.0)
MONO ABS: 0.5 10*3/uL (ref 0.1–1.0)
Monocytes Relative: 11 %
Neutro Abs: 1.8 10*3/uL (ref 1.7–7.7)
Neutrophils Relative %: 42 %
Platelets: 269 10*3/uL (ref 150–400)
RBC: 3.48 MIL/uL — ABNORMAL LOW (ref 3.87–5.11)
RDW: 19.8 % — ABNORMAL HIGH (ref 11.5–15.5)
WBC: 4.2 10*3/uL (ref 4.0–10.5)
nRBC: 0.5 % — ABNORMAL HIGH (ref 0.0–0.2)

## 2018-09-05 LAB — SAMPLE TO BLOOD BANK

## 2018-09-05 NOTE — Assessment & Plan Note (Addendum)
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 Currently on taxotere.   #Status post 3 cycles-Ca-125-rising concerning for progression-given the abdominal bloating.   #Hold chemotherapy today; otherwise labs adequate except for hemoglobin 9.  7.   #Discussed regarding option of clinical trial at [facility]/with antibody drug conjugate.  Patient is interested given the limited chemotherapy options; but has significant concerns about insurance coverage etc.  #Abdominal bloating-likely secondary progressive malignancy.  Patient will need an imaging to restage; however given the possibility of clinical trial at [facility] will await for now.   # severe anemia- s/p PRBC transfusion- today 9.7; stable.  Peripheral neuropathy grade 1.  # DISPOSITION:   # follow up in 2 week-MD-labs-cbc/cmp-ca-125-Dr.B   Addendum: I have reached out to Dr. Theora Gianotti who kindly agrees to have the patient evaluated for clinical trial at [facility].

## 2018-09-05 NOTE — Progress Notes (Signed)
[facility] OFFICE PROGRESS NOTE  Patient Care Team: Jerrol Banana., MD as PCP - General (Family Medicine) Gillis Ends, MD as Referring Physician (Obstetrics and Gynecology) Cammie Sickle, MD as Consulting Physician (Oncology) Corey Skains, MD as Consulting Physician (Cardiology) Marval Regal, NP as Nurse Practitioner (Nurse Practitioner) Manya Silvas, MD as Consulting Physician (Gastroenterology)  Cancer Staging Malignant neoplasm of ovary [facility]) Staging form: Ovary, AJCC 7th Edition - Clinical: Stage IIIC (T3c, N0, M0) - Unsigned    Oncology History   # April- MAY 2016- HIGH GRADE SEROUS OVARIAN CANCER STAGE IIIC; CA-125 +2300+;  s/p OPTIMAL DEBULKING SURGERY   # MAY 2016[name] DD [finished in Sep 19th 2017]  # MAY CT 2017- RECURRENT/Peritoneal carcinomatosis/pelvic implant ~1.2cm/Ca-125-34;   # July 3rd 2017- CARBO-DOXIL q 4W [with neulasta]; CT May 11 2016- slight increase peritoneal / stable nodules. Cont chemo [finished DEC 2017]. Last dose CARBO-07/11/2016; DEC 28th 2017 CT- Increase in peritoneal deposits; increasing Ca 125 [60s]- PLATINUM REFRACTORY  # Jan 2018- Taxol-Avastin x6 cycles; Aug 2018- CR; Aug 2018- start Avastin Maintenance; MARCH 2019- PROGRESSION- STOP avastin.   # MID April 2019- Zejula [300 mg/day]; 4 days- later STOPPED sec to Chest pain;  # June 1st week- Lynparza; AUG 2019- Progression of disease  # AUG, 21st 2019- START CARBO-GEM x4 cycles; NOV, 19th CT Progression/rising Ca-125.   #July 25, 2018- Taxotere weekly; last chemo Jan 10th 2020. Sec to progression; Ca 125- 4200.    # May 2019- TAKASUBO ? [s/p cardiac cath- Dr.Kowalski]  # G-1-2 hand foot syndrome-   # LEFT BREAST CA s/p Lumpec & RT s/p TAM   # Afib [cardiology]; JUNE 2017- MUGA 62%  Jan 2018- MOLECULAR TESTING- ATM DELETERIOUS HETEROZYGOUS mutation; TUMOR BRCA- NEG; Myriad genomic instability- NEGATIVE;   #  NGS/Omniseq- BRCA-2 copy loss/ * [april2019]; NEG- BRCA Mutations/N-TRK/MSI-STABLE.  ---------------------------------------------------------------------    DIAGNOSIS: MAY 2017; RECURRENT OVARIAN CA-high-grade serous/platinum refractory  STAGE: IV    ;GOALS: PALLIATIVE  CURRENT/MOST RECENT THERAPY: Docetaxel weekly     Malignant neoplasm of ovary ([place])   04/08/2018 - 07/08/2018 Chemotherapy    The patient had palonosetron (ALOXI) injection 0.25 mg, 0.25 mg, Intravenous,  Once, 4 of 4 cycles Administration: 0.25 mg (04/09/2018), 0.25 mg (04/30/2018), 0.25 mg (06/04/2018), 0.25 mg (06/25/2018) pegfilgrastim-cbqv (UDENYCA) injection 6 mg, 6 mg, Subcutaneous, Once, 3 of 3 cycles Administration: 6 mg (05/01/2018), 6 mg (06/05/2018), 6 mg (06/26/2018) CARBOplatin (PARAPLATIN) 430 mg in sodium chloride 0.9 % 250 mL chemo infusion, 430 mg (100 % of original dose 430.5 mg), Intravenous,  Once, 4 of 4 cycles Dose modification:   (original dose 430.5 mg, Cycle 1) Administration: 430 mg (04/09/2018), 430 mg (04/30/2018), 430 mg (06/04/2018), 430 mg (06/25/2018) gemcitabine (GEMZAR) 2,000 mg in sodium chloride 0.9 % 250 mL chemo infusion, 1,976 mg, Intravenous,  Once, 4 of 4 cycles Administration: 2,000 mg (04/09/2018), 2,000 mg (04/30/2018), 2,000 mg (06/04/2018), 2,000 mg (06/25/2018)  for chemotherapy treatment.      Ovarian cancer, unspecified laterality ([place])   07/25/2018 -  Chemotherapy    The patient had DOCEtaxel (TAXOTERE) 50 mg in sodium chloride 0.9 % 150 mL chemo infusion, 25 mg/m2 = 50 mg, Intravenous,  Once, 2 of 4 cycles Dose modification: 35 mg/m2 (original dose 25 mg/m2, Cycle 1, Reason: Provider Judgment) Administration: 50 mg (07/25/2018), 70 mg (08/01/2018), 70 mg (08/08/2018), 70 mg (08/22/2018), 70 mg (08/29/2018)  for chemotherapy treatment.  INTERVAL HISTORY:  Jamie Conner 78 y.o.  female pleasant patient above history of high-grade serous ovarian cancer-platinum refractory  currently on weekly Taxotere is here for follow-up.  Patient complains of abdominal bloating.  This is currently improved on taking Gas-X/Tums etc.  Comes and goes.  Denies any worsening constipation.  No nausea vomiting.  Chronic mild tingling and numbness in extremities.  No diarrhea.  No skin rash.  Mild to moderate fatigue.  Review of Systems  Constitutional: Positive for malaise/fatigue. Negative for chills, diaphoresis, fever and weight loss.  HENT: Negative for nosebleeds and sore throat.   Eyes: Negative for double vision.  Respiratory: Negative for cough, hemoptysis, sputum production, shortness of breath and wheezing.   Cardiovascular: Negative for chest pain, palpitations, orthopnea and leg swelling.  Gastrointestinal: Positive for constipation. Negative for abdominal pain, blood in stool, diarrhea, heartburn, melena, nausea and vomiting.  Genitourinary: Negative for dysuria, frequency and urgency.  Musculoskeletal: Negative for back pain and joint pain.  Skin: Negative.  Negative for itching and rash.  Neurological: Positive for tingling. Negative for dizziness, focal weakness, weakness and headaches.  Endo/Heme/Allergies: Does not bruise/bleed easily.  Psychiatric/Behavioral: Negative for depression. The patient is not nervous/anxious and does not have insomnia.       PAST MEDICAL HISTORY :  Past Medical History:  Diagnosis Date  . Atrial fibrillation ([place])   . Breast cancer ([place]) 1998  . Cancer of breast ([place]) 1998   Left/radiation  . CINV (chemotherapy-induced nausea and vomiting)   . GERD (gastroesophageal reflux disease)   . Hyperlipidemia   . Hypothyroidism   . Mitral insufficiency   . Ovarian cancer ([place])    s/p BSO optimal tumor debulking May 2016/chemo  . Personal history of chemotherapy since 2016   ovarian cancer  . Personal history of radiation therapy 1998  . PVC (premature ventricular contraction)     PAST SURGICAL HISTORY :   Past Surgical  History:  Procedure Laterality Date  . ABDOMINAL HYSTERECTOMY    . BILATERAL SALPINGOOPHORECTOMY  May 2016   with optimal tumor debulking   . BREAST LUMPECTOMY Left 1998  . BREAST SURGERY     Left  . CESAREAN SECTION     x2  . CHOLECYSTECTOMY    . COLONOSCOPY  2006  . DEBULKING N/A 12/22/2014   Procedure: DEBULKING;  Surgeon: Gillis Ends, MD;  Location: [facility] ORS;  Service: Gynecology;  Laterality: N/A;  . LAPAROTOMY N/A 12/22/2014   Procedure: EXPLORATORY LAPAROTOMY;  Surgeon: Robert Bellow, MD;  Location: [facility] ORS;  Service: General;  Laterality: N/A;  . LAPAROTOMY WITH STAGING N/A 12/22/2014   Procedure: LAPAROTOMY WITH STAGING;  Surgeon: Gillis Ends, MD;  Location: [facility] OR[facility];  Service: Gynecology;  Laterality: N/A;  . LEFT HEART CATH AND CORONARY ANGIOGRAPHY N/A 12/10/2017   Procedure: LEFT HEART CATH AND CORONARY ANGIOGRAPHY;  Surgeon: Yolonda Kida, MD;  Location: [place] [facility];  Service: Cardiovascular;  Laterality: N/A;  . PERIPHERAL VASCULAR CATHETERIZATION Left 12/22/2014   Procedure: PORTA CATH INSERTION;  Surgeon: Robert Bellow, MD;  Location: [facility] OR[facility];  Service: General;  Laterality: Left;  . PORTACATH PLACEMENT Right 12/22/2014   Procedure: INSERTION PORT-A-CATH;  Surgeon: Robert Bellow, MD;  Location: [facility];  Service: General;  Laterality: Right;  . SALPINGOOPHORECTOMY Bilateral 12/22/2014   Procedure: SALPINGO OOPHORECTOMY;  Surgeon: Gillis Ends, MD;  Location: [facility] OR[facility];  Service: Gynecology;  Laterality: Bilateral;  . UPPER GI ENDOSCOPY  09/25/04  hiatus hernia, schatzki ring and a single gastric polyp    FAMILY HISTORY :   Family History  Problem Relation Age of Onset  . Cancer Mother        breast, throat, and stomach  . Breast cancer Mother   . Heart disease Father   . Pulmonary embolism Sister   . Cancer Brother 60       angiosarcoma of the chest; carcinoid small intestinal tumor  . Hypertension Brother   .  Stroke Brother   . Cancer Maternal Aunt        breast cancer  . Cancer Maternal Grandfather 34       pancreatic    SOCIAL HISTORY:   Social History   Tobacco Use  . Smoking status: Never Smoker  . Smokeless tobacco: Never Used  Substance Use Topics  . Alcohol use: No  . Drug use: No    ALLERGIES:  is allergic to carafate [sucralfate] and sulfa antibiotics.  MEDICATIONS:  Current Outpatient Medications  Medication Sig Dispense Refill  . acetaminophen (TYLENOL) 500 MG tablet Take 500 mg by mouth every 6 (six) hours as needed.    Marland Kitchen alum & mag hydroxide-simeth (MAALOX/MYLANTA) 200-200-20 MG/5ML suspension Take 15 mLs by mouth every 6 (six) hours as needed for indigestion or heartburn. 355 mL 0  . aspirin EC 325 MG tablet Take 1 tablet (325 mg total) by mouth daily. 100 tablet 3  . chlorhexidine (PERIDEX) 0.12 % solution     . Cholecalciferol (VITAMIN D) 2000 units tablet Take 1 tablet (2,000 Units total) by mouth daily. 30 tablet 12  . Docosanol (ABREVA EX) Apply 1 application topically as needed. Fever blisters    . fluticasone (FLONASE) 50 MCG/ACT nasal spray Place 1 spray into both nostrils daily as needed.     . furosemide (LASIX) 20 MG tablet Take 0.5 tablets (10 mg total) by mouth daily. 30 tablet 0  . lactulose (CHRONULAC) 10 GM/15ML solution Take 15 mLs (10 g total) by mouth daily as needed for moderate constipation. 236 mL 0  . levothyroxine (SYNTHROID, LEVOTHROID) 50 MCG tablet TAKE ONE TABLET ON AN EMPTY STOMACH WITHA GLASS OF WATER AT LEAST 30 TO 60 MINUTES BEFORE BREAKFAST 30 tablet 12  . lidocaine-prilocaine (EMLA) cream Apply to port area 30-45 mins prior to chemo. 30 g 5  . loratadine (CLARITIN) 10 MG tablet Take 10 mg by mouth daily as needed.     Marland Kitchen LORazepam (ATIVAN) 0.5 MG tablet TAKE ONE TABLET BY MOUTH AT BEDTIME AS NEEDED FOR ANXIETY/SLEEP 30 tablet 0  . Lysine 500 MG TABS Take 1 tablet by mouth daily as needed.     . metoprolol succinate (TOPROL-XL) 25 MG 24 hr  tablet Take 1 tablet by mouth daily.    . Multiple Vitamin (MULTIVITAMIN) capsule Take 1 capsule by mouth daily.    . ondansetron (ZOFRAN) 4 MG tablet One pill 30-60 mins prior to taking zejula to prevent nausea/vomitting. 45 tablet 3  . ondansetron (ZOFRAN) 8 MG tablet Take 1 tablet (8 mg total) by mouth every 8 (eight) hours as needed for nausea or vomiting. 20 tablet 4  . pantoprazole (PROTONIX) 40 MG tablet TAKE ONE TABLET EVERY DAY 30 tablet 1  . polyethylene glycol (MIRALAX / GLYCOLAX) packet Take 17 g by mouth daily as needed.    . prochlorperazine (COMPAZINE) 10 MG tablet Take 1 tablet (10 mg total) by mouth every 6 (six) hours as needed for nausea or vomiting. 30 tablet 4  .  Simethicone (GAS-X PO) Take 1 tablet by mouth daily.     No current facility-administered medications for this visit.     PHYSICAL EXAMINATION: ECOG PERFORMANCE STATUS: 1 - Symptomatic but completely ambulatory  BP (!) 142/72 (BP Location: Left Arm, Patient Position: Sitting)   Pulse 79   Temp (!) 97 F (36.1 C) (Tympanic)   Resp 16   Ht '5\' 4"'$  (1.626 m)   Wt 192 lb 6.4 oz (87.3 kg)   BMI 33.03 kg/m   Filed Weights   09/05/18 1043  Weight: 192 lb 6.4 oz (87.3 kg)    Physical Exam  Constitutional: She is oriented to person, place, and time and well-developed, well-nourished, and in no distress.  Accompanied by husband.  She is walking by herself.  HENT:  Head: Normocephalic and atraumatic.  Mouth/Throat: Oropharynx is clear and moist. No oropharyngeal exudate.  Eyes: Pupils are equal, round, and reactive to light.  Neck: Normal range of motion. Neck supple.  Cardiovascular: Normal rate and regular rhythm.  Pulmonary/Chest: No respiratory distress. She has no wheezes.  Abdominal: Soft. Bowel sounds are normal. She exhibits no distension and no mass. There is no abdominal tenderness. There is no rebound and no guarding.  Mild abdominal distention noted.  Mild ascites noted.  Musculoskeletal: Normal  range of motion.        General: No tenderness or edema.  Neurological: She is alert and oriented to person, place, and time.  Skin: Skin is warm.  Psychiatric: Affect normal.    LABORATORY DATA:  I have reviewed the data as listed    Component Value Date/Time   NA 139 09/05/2018 1007   NA 140 12/14/2014 1037   K 4.1 09/05/2018 1007   K 4.4 12/14/2014 1037   CL 106 09/05/2018 1007   CL 106 12/14/2014 1037   CO2 26 09/05/2018 1007   CO2 26 12/14/2014 1037   GLUCOSE 107 (H) 09/05/2018 1007   GLUCOSE 87 12/14/2014 1037   BUN 15 09/05/2018 1007   BUN 12 12/14/2014 1037   CREATININE 1.01 (H) 09/05/2018 1007   CREATININE 0.97 12/14/2014 1037   CALCIUM 9.0 09/05/2018 1007   CALCIUM 9.2 12/14/2014 1037   PROT 6.4 (L) 09/05/2018 1007   PROT 7.3 12/14/2014 1037   ALBUMIN 3.1 (L) 09/05/2018 1007   ALBUMIN 3.8 12/14/2014 1037   AST 26 09/05/2018 1007   AST 20 12/14/2014 1037   ALT 13 09/05/2018 1007   ALT 11 (L) 12/14/2014 1037   ALKPHOS 69 09/05/2018 1007   ALKPHOS 138 (H) 12/14/2014 1037   BILITOT 0.5 09/05/2018 1007   BILITOT 0.5 12/14/2014 1037   GFRNONAA 54 (L) 09/05/2018 1007   GFRNONAA 59 (L) 12/14/2014 1037   GFRAA >60 09/05/2018 1007   GFRAA >60 12/14/2014 1037    No results found for: SPEP, UPEP  Lab Results  Component Value Date   WBC 4.2 09/05/2018   NEUTROABS 1.8 09/05/2018   HGB 9.5 (L) 09/05/2018   HCT 31.3 (L) 09/05/2018   MCV 89.9 09/05/2018   PLT 269 09/05/2018      Chemistry      Component Value Date/Time   NA 139 09/05/2018 1007   NA 140 12/14/2014 1037   K 4.1 09/05/2018 1007   K 4.4 12/14/2014 1037   CL 106 09/05/2018 1007   CL 106 12/14/2014 1037   CO2 26 09/05/2018 1007   CO2 26 12/14/2014 1037   BUN 15 09/05/2018 1007   BUN 12 12/14/2014 1037  CREATININE 1.01 (H) 09/05/2018 1007   CREATININE 0.97 12/14/2014 1037   GLU 84 03/16/2014      Component Value Date/Time   CALCIUM 9.0 09/05/2018 1007   CALCIUM 9.2 12/14/2014 1037    ALKPHOS 69 09/05/2018 1007   ALKPHOS 138 (H) 12/14/2014 1037   AST 26 09/05/2018 1007   AST 20 12/14/2014 1037   ALT 13 09/05/2018 1007   ALT 11 (L) 12/14/2014 1037   BILITOT 0.5 09/05/2018 1007   BILITOT 0.5 12/14/2014 1037       RADIOGRAPHIC STUDIES: I have personally reviewed the radiological images as listed and agreed with the findings in the report. No results found.   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 Currently on taxotere.   #Status post 3 cycles-Ca-125-rising concerning for progression-given the abdominal bloating.   #Hold chemotherapy today; otherwise labs adequate except for hemoglobin 9.  7.   #Discussed regarding option of clinical trial at [facility]/with antibody drug conjugate.  Patient is interested given the limited chemotherapy options; but has significant concerns about insurance coverage etc.  #Abdominal bloating-likely secondary progressive malignancy.  Patient will need an imaging to restage; however given the possibility of clinical trial at [facility] will await for now.   # severe anemia- s/p PRBC transfusion- today 9.7; stable.  Peripheral neuropathy grade 1.  # DISPOSITION:   # follow up in 2 week-MD-labs-cbc/cmp-ca-125-Dr.B   Addendum: I have reached out to Dr. Theora Gianotti who kindly agrees to have the patient evaluated for clinical trial at [facility].    No orders of the defined types were placed in this encounter.  All questions were answered. The patient knows to call the clinic with any problems, questions or concerns.      Cammie Sickle, MD 09/05/2018 1:15 PM

## 2018-09-05 NOTE — Progress Notes (Signed)
Patient here for treatment plan. She reports feeling very tired lately. Nausea that responded well to Zofran.

## 2018-09-10 ENCOUNTER — Encounter: Payer: Self-pay | Admitting: Obstetrics and Gynecology

## 2018-09-10 ENCOUNTER — Inpatient Hospital Stay (HOSPITAL_BASED_OUTPATIENT_CLINIC_OR_DEPARTMENT_OTHER): Payer: PPO | Admitting: Obstetrics and Gynecology

## 2018-09-10 VITALS — BP 131/84 | HR 80 | Temp 98.0°F | Resp 18 | Ht 64.0 in | Wt 197.3 lb

## 2018-09-10 DIAGNOSIS — Z90722 Acquired absence of ovaries, bilateral: Secondary | ICD-10-CM

## 2018-09-10 DIAGNOSIS — Z853 Personal history of malignant neoplasm of breast: Secondary | ICD-10-CM

## 2018-09-10 DIAGNOSIS — Z90710 Acquired absence of both cervix and uterus: Secondary | ICD-10-CM | POA: Diagnosis not present

## 2018-09-10 DIAGNOSIS — Z9221 Personal history of antineoplastic chemotherapy: Secondary | ICD-10-CM

## 2018-09-10 DIAGNOSIS — Z923 Personal history of irradiation: Secondary | ICD-10-CM | POA: Diagnosis not present

## 2018-09-10 DIAGNOSIS — R180 Malignant ascites: Secondary | ICD-10-CM

## 2018-09-10 DIAGNOSIS — Z79899 Other long term (current) drug therapy: Secondary | ICD-10-CM

## 2018-09-10 DIAGNOSIS — C786 Secondary malignant neoplasm of retroperitoneum and peritoneum: Secondary | ICD-10-CM

## 2018-09-10 DIAGNOSIS — C569 Malignant neoplasm of unspecified ovary: Secondary | ICD-10-CM

## 2018-09-10 DIAGNOSIS — Z5111 Encounter for antineoplastic chemotherapy: Secondary | ICD-10-CM | POA: Diagnosis not present

## 2018-09-10 NOTE — Patient Instructions (Signed)
If you need to be seen in the interim, please call [phone], press option 3 for triage. Leave a message with your name, date of birth, and let her know who you need to see. My office number is [phone] and you are welcome to leave a message.

## 2018-09-10 NOTE — Progress Notes (Signed)
Gynecologic Oncology Interval Visit   Referring Provider: Janek Choksi, MD.  Chief Concern: Platinum-resistant recurrent stage IIIc ovarian cancer  Subjective:  Jamie Conner is a 78 y.o. female who is seen for platinum-resistant recurrent stage IIIc ovarian cancer.   Treated by Dr. Brahmanday and on Gemzar/Platin 8-11/19.  Progressing on CT scan and switched to weekly taxotere 12/19.  CA 125 rising rapidly from 1425 to 2405 to 4722 in the past 5 weeks.    IMPRESSION: 1. Further progression in multifocal peritoneal metastatic disease. Several of the lesions are associated with the bowel serosa and place the patient at risk for bowel obstruction, not demonstrated at this time. No significant ascites. 2. No evidence of solid visceral organ metastatic disease.  She had been on  maintenance bevacizumab until MARCH 2019 when she was diagnosed with PROGRESSION. She was started on PARPi.  April 2019- niraparib [300 mg/day]; 4 days- later STOPPED sec to Chest pain/cardiac spasm; June 1st week-  Switched to olaparib    She was diagnosed with progression based on CT scan and proceeded with carbo-Gem cycle #1; day 1 on 04/09/2018.    Ref Range & Units 7d ago 3wk ago 2mo ago 3mo ago 4mo ago  Cancer Antigen (CA) 125 0.0 - 38.1 U/mL 234.0High   181.0High  CM 133.2High  CM 90.4High  CM 33.4     Her main symptoms are fatigue, weakness, indigestion/reflex at night, one episode of nausea after gemcitabine, and constipation that she manages very well with Miralax.     Since her last visit she had the following imaging studies:  10/23/2017 CT Abdomen/pelvis IMPRESSION: 1. There is apparent increase soft tissue thickening around the cecal base which appears new from previous exam. Although nonspecific and can occur for variety of reasons including incomplete distention, serosal deposits reflecting peritoneal disease cannot be excluded. Consider further evaluation with PET-CT. 2. New  asymmetric soft tissue thickening along the left side of vaginal cuff. 3. Aortic atherosclerosis  10/23/2017 MRI brain IMPRESSION: No acute brain finding. Mild to moderate chronic small-vessel change of the deep white matter. No sign of metastatic disease to the brain. Sclerotic focus within the C3 vertebral body which is nonspecific. Metastatic disease to bone not excluded given the history breast cancer.  10/23/2017 PET scan  IMPRESSION: 1. Multiple small hypermetabolic foci of tumor observed along the bowel and pelvic mesentery, as well as the vaginal cuff, compatible with early spread of ovarian cancer. 2. No findings of involvement of the thorax, neck, or skeleton. 3. Diffuse mild thyroid activity favoring mild thyroiditis. 4. Other imaging findings of potential clinical significance: Small type 1 hiatal hernia. Mild cardiomegaly. Aortic Atherosclerosis (ICD10-I70.0). Scattered sigmoid colon diverticula. Posterior right hepatic lobe hemangioma.  12/11/2017 CT chest scan  IMPRESSION: 1. No evidence of pulmonary embolus. 2. No acute cardiopulmonary disease. 3. Aortic Atherosclerosis (ICD10-I70.0).   03/28/2018 CT C/A/P and neck IMPRESSION: 1. Progressive multifocal peritoneal metastatic disease. No significant ascites. Several of the lesions are associated with the colonic serosa, including one which protrudes into the lumen of the distal sigmoid colon. No evidence of bowel obstruction at this time. 2. No evidence of solid visceral organ metastasis. No thoracic metastatic disease identified. 3. Stable hepatic hemangioma. 4.  Aortic Atherosclerosis (ICD10-I70.0).  No metastatic disease in the neck.   She presents today for a pelvic exam.    Gynecologic Oncology History Jamie Conner is a pleasant female who is seen for postoperative visit for stage IIIc high-grade serous ovarian cancer. See   prior notes for complete detail.  She also has a history of  carcinoma of  breast ( left) status post lumpectomy , radiation therapy and 5 years of tamoxifen. She underwent exploratory Laparotomy, bilateral salpingo-oophorectomy, right ureterolysis, infragastric omentectomy, and optimally debulked to no gross residual  May 4th, 2016. Preop CA125 2354.0.  She started chemotherapy with carboplatinum and Taxol in dose dense fashion from Jan 02, 2015. She underwent 6 cycles of chemotherapy completed 05/09/2015. Her dose of Taxol was reduced because of myelosuppression and fever in spite of Neulasta.  Nadir CA125 = 9.8  06/06/2015 CT scan abdomen and pelvis IMPRESSION: 1. Interval removal of the large right ovarian mass . Dramatic improvement in the appearance of mesenteric and omental implants. There is a 3 mm potential faint omental nodule at about the level of the umbilicus but for the most part the numerous prior omental and mesenteric tumor implants have resolved completely. 2. 1.8 by 1.2 cm fluid density structure along the splenic hilum, no change from prior, likely a small benign cystic lesion.  3. Other imaging findings of potential clinical significance: 3 cm periampullary duodenal diverticulum.   01/04/16 CA125 34.1  01/10/2016 CT scan chest abdomen and pelvis. 1. New nodular density 12 x 7 mm ties are seen throughout the pelvis. The largest measures 12 x 10 mm best seen on image number 105 of series. A 9 mm nodule is seen in the left pelvis best seen on image number 60 of series 5. These are concerning for possible peritoneal implants. 2. Stable 1.9 cm cystic lesion seen in splenic hilum.  Vaginal biopsy performed on 01/04/2016 negative  She was started on PLD and carboplatin 02/20/2016  05/11/2016 CT scan abdomen and pelvis IMPRESSION: 1. No acute findings identified within the abdomen or pelvis. 2. Peritoneal nodules within the right side of pelvis are stable the slightly increased in size from previous exam. No new areas of disease identified.  She has  completed 6 cycles of  PLD and carboplatin therapy and imaging revealed progressive disease.   08/15/2016 CT scan abdomen and pelvis 1. Study demonstrates slight interval growth of several peritoneal implants in the low anatomic pelvis indicative of slight progression of disease. No other new peritoneal deposits are noted, and there is no other metastatic disease noted elsewhere in the chest, abdomen or pelvis. 2. Colonic diverticulosis without evidence of acute diverticulitis at this time. 3. Aortic atherosclerosis, in addition to left main coronary artery disease. Assessment for potential risk factor modification, dietary therapy or pharmacologic therapy may be warranted, if clinically indicated. 4. Additional incidental findings, as above.  Rucaparib denied by insurance.   09/24/2016 She was started on paclitaxel and bevacizumab and received 6 cycles of paclitaxel and bevacizumab 03/04/2017 with evidence of response  CT Abdomen and Pelvis 03/28/2017 IMPRESSION: 1. Status post hysterectomy, without recurrent or metastatic disease. 2.  Possible constipation. 3.  Tiny hiatal hernia. 4. Hepatic hemangiomas. 5. Cystic lesion within the right-side of the vagina is similar over prior exams and may represent a Bartholin's gland cyst._0  MRI pelvis on 07/09/2017 No evidence of recurrent or metastatic carcinoma within the pelvis. Stable 1.5 cm right vaginal wall cyst, consistent with a benign Gardner's duct or Bartholin's gland cyst.  MRI lumbar spine on 07/09/2017 Negative for metastatic disease. Mild lumbar degenerative change without neural impingement.  Continued on maintenance bevacizumab since August 2018 until March 2019.    CA125 02/20/2016 47.5 03/21/2016 31.1 05/16/2016 37.5 06/13/2016 44.8 06/27/2016 50.4 08/15/2016 64.5 09/24/2016 101.7  10/22/2016 18.2 11/05/2016 13. 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  HRD testing negative ([facility])  I spoke with Dr. Lisbeth Ply at [facility] and he recommended genetic counseling for the patient and possibly other family members. He provided a phone number for them to call to schedule that appointment. The number is [phone]. Of note her daughter has tested positive for an ATM mutation carrier and she requested recommendations for who to see at [facility].    Problem List: Patient Active Problem List   Diagnosis Date Noted  . Fever of unknown origin 07/06/2018  . Chest pain 12/10/2017  . STEMI (ST elevation myocardial infarction) ([place]) 12/10/2017  . Ovarian cancer, unspecified laterality ([place]) 09/11/2017  . Counseling regarding goals of care 11/26/2016  . Encounter for monitoring cardiotoxic drug therapy 02/02/2016  . Breast cancer in female [facility]) 12/13/2015  . Peptic ulcer disease 12/12/2015  . DDD (degenerative disc disease), lumbosacral 12/12/2015  . Hyperlipidemia 12/12/2015  . Acute anxiety 12/12/2015  . Insomnia 12/12/2015  . Allergic rhinitis 12/12/2015  . GERD (gastroesophageal reflux disease) 12/12/2015  . Internal hemorrhoid 12/12/2015  . OA (osteoarthritis) 12/12/2015  . Osteopenia 12/12/2015  . Hypothyroid 04/11/2015  . Benign essential HTN 02/01/2015  . Retroperitoneal fibrosis   . Combined fat and carbohydrate induced hyperlipemia 01/04/2015  . Awareness of heartbeats 01/04/2015  . Beat, premature ventricular 01/04/2015  . Malignant neoplasm of ovary ([place]) 12/16/2014  . MI (mitral incompetence) 09/02/2014  . AF (paroxysmal atrial fibrillation) ([place]) 09/02/2014    Past Medical History: Past Medical History:  Diagnosis Date  . Atrial fibrillation ([place])   . Breast cancer ([place]) 1998  . Cancer of breast ([place]) 1998   Left/radiation  . CINV (chemotherapy-induced nausea and vomiting)   . GERD  (gastroesophageal reflux disease)   . Hyperlipidemia   . Hypothyroidism   . Mitral insufficiency   . Ovarian cancer ([place])    s/p BSO optimal tumor debulking May 2016/chemo  . Personal history of chemotherapy since 2016   ovarian cancer  . Personal history of radiation therapy 1998  . PVC (premature ventricular contraction)     Past Surgical History: Past Surgical History:  Procedure Laterality Date  . ABDOMINAL HYSTERECTOMY    . BILATERAL SALPINGOOPHORECTOMY  May 2016   with optimal tumor debulking   . BREAST LUMPECTOMY Left 1998  . BREAST SURGERY     Left  . CESAREAN SECTION     x2  . CHOLECYSTECTOMY    . COLONOSCOPY  2006  . DEBULKING N/A 12/22/2014   Procedure: DEBULKING;  Surgeon: Gillis Ends, MD;  Location: [facility] OR[facility];  Service: Gynecology;  Laterality: N/A;  . LAPAROTOMY N/A 12/22/2014   Procedure: EXPLORATORY LAPAROTOMY;  Surgeon: Robert Bellow, MD;  Location: [facility] ORS;  Service: General;  Laterality: N/A;  . LAPAROTOMY WITH STAGING N/A 12/22/2014   Procedure: LAPAROTOMY WITH STAGING;  Surgeon: Gillis Ends, MD;  Location: [facility] OR[facility];  Service: Gynecology;  Laterality: N/A;  . LEFT HEART CATH AND CORONARY ANGIOGRAPHY N/A 12/10/2017   Procedure: LEFT HEART CATH AND CORONARY ANGIOGRAPHY;  Surgeon: Yolonda Kida, MD;  Location: [place] [facility];  Service: Cardiovascular;  Laterality: N/A;  . PERIPHERAL VASCULAR CATHETERIZATION Left 12/22/2014   Procedure: PORTA CATH INSERTION;  Surgeon: Robert Bellow, MD;  Location: [facility];  Service: General;  Laterality:  Left;  . PORTACATH PLACEMENT Right 12/22/2014   Procedure: INSERTION PORT-A-CATH;  Surgeon: Jeffrey W Byrnett, MD;  Location: [facility];  Service: General;  Laterality: Right;  . SALPINGOOPHORECTOMY Bilateral 12/22/2014   Procedure: SALPINGO OOPHORECTOMY;  Surgeon: Angeles Alvarez Secord, MD;  Location: [facility];  Service: Gynecology;  Laterality: Bilateral;  . UPPER GI ENDOSCOPY  09/25/04   hiatus  hernia, schatzki ring and a single gastric polyp    Past Gynecologic History:  See HPI  OB History:  OB History  Gravida Para Term Preterm AB Living  2         2  SAB TAB Ectopic Multiple Live Births               # Outcome Date GA Lbr Len/2nd Weight Sex Delivery Anes PTL Lv  2 Gravida           1 Gravida             Obstetric Comments  1st Menstrual Cycle:  14  1st Pregnancy:  38    Family History: Family History  Problem Relation Age of Onset  . Cancer Mother        breast, throat, and stomach  . Breast cancer Mother   . Heart disease Father   . Pulmonary embolism Sister   . Cancer Brother 60       angiosarcoma of the chest; carcinoid small intestinal tumor  . Hypertension Brother   . Stroke Brother   . Cancer Maternal Aunt        breast cancer  . Cancer Maternal Grandfather 80       pancreatic    Social History: Social History   Socioeconomic History  . Marital status: Married    Spouse name: Not on file  . Number of children: 2  . Years of education: Not on file  . Highest education level: Bachelor's degree (e.g., BA, AB, BS)  Occupational History  . Occupation: retired  Social Needs  . Financial resource strain: Not hard at all  . Food insecurity:    Worry: Never true    Inability: Never true  . Transportation needs:    Medical: No    Non-medical: No  Tobacco Use  . Smoking status: Never Smoker  . Smokeless tobacco: Never Used  Substance and Sexual Activity  . Alcohol use: No  . Drug use: No  . Sexual activity: Not Currently  Lifestyle  . Physical activity:    Days per week: Not on file    Minutes per session: Not on file  . Stress: To some extent  Relationships  . Social connections:    Talks on phone: Not on file    Gets together: Not on file    Attends religious service: Not on file    Active member of club or organization: Not on file    Attends meetings of clubs or organizations: Not on file    Relationship status: Not on file   . Intimate partner violence:    Fear of current or ex partner: Not on file    Emotionally abused: Not on file    Physically abused: Not on file    Forced sexual activity: Not on file  Other Topics Concern  . Not on file  Social History Narrative  . Not on file    Allergies: Allergies  Allergen Reactions  . Carafate [Sucralfate] Rash  . Sulfa Antibiotics Rash    Current Medications: Current Outpatient Medications  Medication Sig Dispense   Refill  . acetaminophen (TYLENOL) 500 MG tablet Take 500 mg by mouth every 6 (six) hours as needed.    . alum & mag hydroxide-simeth (MAALOX/MYLANTA) 200-200-20 MG/5ML suspension Take 15 mLs by mouth every 6 (six) hours as needed for indigestion or heartburn. 355 mL 0  . aspirin EC 325 MG tablet Take 1 tablet (325 mg total) by mouth daily. 100 tablet 3  . chlorhexidine (PERIDEX) 0.12 % solution     . Cholecalciferol (VITAMIN D) 2000 units tablet Take 1 tablet (2,000 Units total) by mouth daily. 30 tablet 12  . Docosanol (ABREVA EX) Apply 1 application topically as needed. Fever blisters    . fluticasone (FLONASE) 50 MCG/ACT nasal spray Place 1 spray into both nostrils daily as needed.     . furosemide (LASIX) 20 MG tablet Take 0.5 tablets (10 mg total) by mouth daily. 30 tablet 0  . lactulose (CHRONULAC) 10 GM/15ML solution Take 15 mLs (10 g total) by mouth daily as needed for moderate constipation. 236 mL 0  . levothyroxine (SYNTHROID, LEVOTHROID) 50 MCG tablet TAKE ONE TABLET ON AN EMPTY STOMACH WITHA GLASS OF WATER AT LEAST 30 TO 60 MINUTES BEFORE BREAKFAST 30 tablet 12  . lidocaine-prilocaine (EMLA) cream Apply to port area 30-45 mins prior to chemo. 30 g 5  . loratadine (CLARITIN) 10 MG tablet Take 10 mg by mouth daily as needed.     . LORazepam (ATIVAN) 0.5 MG tablet TAKE ONE TABLET BY MOUTH AT BEDTIME AS NEEDED FOR ANXIETY/SLEEP 30 tablet 0  . Lysine 500 MG TABS Take 1 tablet by mouth daily as needed.     . metoprolol succinate (TOPROL-XL)  25 MG 24 hr tablet Take 1 tablet by mouth daily.    . Multiple Vitamin (MULTIVITAMIN) capsule Take 1 capsule by mouth daily.    . ondansetron (ZOFRAN) 4 MG tablet One pill 30-60 mins prior to taking zejula to prevent nausea/vomitting. 45 tablet 3  . ondansetron (ZOFRAN) 8 MG tablet Take 1 tablet (8 mg total) by mouth every 8 (eight) hours as needed for nausea or vomiting. 20 tablet 4  . pantoprazole (PROTONIX) 40 MG tablet TAKE ONE TABLET EVERY DAY 30 tablet 1  . polyethylene glycol (MIRALAX / GLYCOLAX) packet Take 17 g by mouth daily as needed.    . prochlorperazine (COMPAZINE) 10 MG tablet Take 1 tablet (10 mg total) by mouth every 6 (six) hours as needed for nausea or vomiting. 30 tablet 4  . Simethicone (GAS-X PO) Take 1 tablet by mouth daily.     No current facility-administered medications for this visit.     Review of Systems General: fatigue  HEENT: negative  Lungs: negative for SOB or cough  Cardiac: no complaints  GI: using Miralax daily for constipation; nausea x 1 after chemotherapy; indigestion and reflex  GU: no complaints  Musculoskeletal: h/o  back pain and sciatica improved with PT  Extremities: no complaints  Skin: no complaints  Neuro: peripheral neuropathy, stable  Endocrine: no complaints  Psych: no complaints       Objective:  Physical Examination:  BP 131/84   Pulse 80   Temp 98 F (36.7 C) (Tympanic)   Resp 18   Ht 5' 4" (1.626 m)   Wt 197 lb 4.8 oz (89.5 kg)   BMI 33.87 kg/m .    ECOG Performance Status: 1 - Symptomatic but completely ambulatory  General appearance: alert, cooperative and appears stated age HEENT: Atraumatic and normocephalic Lymph node survey: non-palpable inguinal and   supraclavicular Cardiovascular: Regulatory rate and rhythm Respiratory: Bilateral clear to auscultation Abdomen: Soft nontender and nondistended. Incision all well healed. No hernias, no masses, no ascites.  Extremities:No edema Skin exam - Well healed  incisions.  Neurological exam reveals alert, oriented, normal speech, no focal findings or movement disorder noted  Pelvic: Pelvic exam; Chaperoned by nurse: Pelvic: Vulva: normal appearing vulva with no masses, tenderness or lesions, atrophy; Vagina: normal left paravaginal cyst approximately mid portion of the vagina, no discharge, on BME the left paravaginal cyst was stable but there were 2 nodular masses at the vaginal cuff; Uterus and Cervix: surgically absent; Rectal: confirms. Approximately 2 cm and another 1.5 cm nodule involving vaginal cuff and separate from rectal mucosa.   Lab Review CA125 as noted above  Radiologic Imaging: n/a    Assessment:  Jamie Conner is a 78 y.o. female diagnosed with optimally debulked stage IIIc  high-grade serous ovarian cancer, initially diagnosed 12/22/2014 s/p carboplatin/taxol chemotherapy with complete response.   Recurrent ovarian cancer diagnosed 01/10/2016.  Recurrent platinum-sensitive ovarian cancer with stable disease on PLD/carboplatin based on RECIST criteria with progression 9/17.  Platinum-resistant disease initiated on paclitaxel and bevacizumab with evidence of complete clinical response based on exam, CT scan, and CA125. Disease controlled on maintenance bevacizumab until MARCH 2019. Rapid progression on PARPi based on CA125 and imaging.   Treated with gemcitabine/platin therapy 8-11/19 with progression on CT scan.   Switched to Taxotere 12/19 with rapidly rising CA125 over 4000. Having significant reflux and abdominal pain/distention.  No obvious tense ascites.  ATM mutation positive. HRD negative. Extensive family history of cancer (breast, pancreatic, throat, gastric, adenosarcoma of the chest, carcinoid tumor of the small bowel) in several first degree relatives.   History of left-sided Sciatica with ambulatory impairment improved with PT. MRI c/w DJD.  Plan:   Problem List Items Addressed This Visit      Endocrine    Ovarian cancer, unspecified laterality ([place]) - Primary     We discussed the [employer] study with the patient and her husband, but her insurance does not cover care at [facility].  We spoke at length about the fact that her disease is progressing and becoming symptomatic with GI symptoms that are likely to worsen with further progression of her disease in the near future in view of lack of response to current treatment. She has exhausted standard options and I recommended that the focus shift to palliative care.  She will see them and also have an [place] to see if there is significant ascites that could be drained or a catheter placed for repeated drainage.  She will follow up with Dr. Rogue Bussing next week for follow up and to make a decision on whether she wants to pursue additional treatment. Mellody Drown, MD    CC:  Blain Pais, MD.

## 2018-09-10 NOTE — Progress Notes (Signed)
Pt states that she has reflux like bile comes up in her throat at night, she does have head of bed elevated. She has aching and feels full in right side and goes around to her back

## 2018-09-12 ENCOUNTER — Inpatient Hospital Stay (HOSPITAL_BASED_OUTPATIENT_CLINIC_OR_DEPARTMENT_OTHER): Payer: PPO | Admitting: Hospice and Palliative Medicine

## 2018-09-12 ENCOUNTER — Ambulatory Visit
Admission: RE | Admit: 2018-09-12 | Discharge: 2018-09-12 | Disposition: A | Discharge status: Home / Self Care | Payer: PPO | Source: Ambulatory Visit | Attending: Internal Medicine | Admitting: Internal Medicine

## 2018-09-12 VITALS — BP 123/77 | HR 109 | Temp 97.6°F | Resp 20 | Ht 64.0 in | Wt 195.0 lb

## 2018-09-12 DIAGNOSIS — R531 Weakness: Secondary | ICD-10-CM | POA: Diagnosis not present

## 2018-09-12 DIAGNOSIS — R5383 Other fatigue: Secondary | ICD-10-CM | POA: Diagnosis not present

## 2018-09-12 DIAGNOSIS — Z7982 Long term (current) use of aspirin: Secondary | ICD-10-CM

## 2018-09-12 DIAGNOSIS — Z90722 Acquired absence of ovaries, bilateral: Secondary | ICD-10-CM

## 2018-09-12 DIAGNOSIS — E039 Hypothyroidism, unspecified: Secondary | ICD-10-CM

## 2018-09-12 DIAGNOSIS — Z5111 Encounter for antineoplastic chemotherapy: Secondary | ICD-10-CM | POA: Diagnosis not present

## 2018-09-12 DIAGNOSIS — I480 Paroxysmal atrial fibrillation: Secondary | ICD-10-CM | POA: Diagnosis not present

## 2018-09-12 DIAGNOSIS — Z800 Family history of malignant neoplasm of digestive organs: Secondary | ICD-10-CM

## 2018-09-12 DIAGNOSIS — Z90710 Acquired absence of both cervix and uterus: Secondary | ICD-10-CM | POA: Diagnosis not present

## 2018-09-12 DIAGNOSIS — D649 Anemia, unspecified: Secondary | ICD-10-CM | POA: Diagnosis not present

## 2018-09-12 DIAGNOSIS — Z515 Encounter for palliative care: Secondary | ICD-10-CM | POA: Diagnosis not present

## 2018-09-12 DIAGNOSIS — Z79899 Other long term (current) drug therapy: Secondary | ICD-10-CM

## 2018-09-12 DIAGNOSIS — I2510 Atherosclerotic heart disease of native coronary artery without angina pectoris: Secondary | ICD-10-CM

## 2018-09-12 DIAGNOSIS — R180 Malignant ascites: Secondary | ICD-10-CM | POA: Diagnosis not present

## 2018-09-12 DIAGNOSIS — R5381 Other malaise: Secondary | ICD-10-CM | POA: Diagnosis not present

## 2018-09-12 DIAGNOSIS — C786 Secondary malignant neoplasm of retroperitoneum and peritoneum: Secondary | ICD-10-CM | POA: Diagnosis not present

## 2018-09-12 DIAGNOSIS — C569 Malignant neoplasm of unspecified ovary: Secondary | ICD-10-CM | POA: Diagnosis not present

## 2018-09-12 DIAGNOSIS — Z923 Personal history of irradiation: Secondary | ICD-10-CM

## 2018-09-12 DIAGNOSIS — K219 Gastro-esophageal reflux disease without esophagitis: Secondary | ICD-10-CM

## 2018-09-12 DIAGNOSIS — E785 Hyperlipidemia, unspecified: Secondary | ICD-10-CM | POA: Diagnosis not present

## 2018-09-12 DIAGNOSIS — I252 Old myocardial infarction: Secondary | ICD-10-CM

## 2018-09-12 DIAGNOSIS — Z853 Personal history of malignant neoplasm of breast: Secondary | ICD-10-CM

## 2018-09-12 DIAGNOSIS — Z803 Family history of malignant neoplasm of breast: Secondary | ICD-10-CM

## 2018-09-12 DIAGNOSIS — Z7189 Other specified counseling: Secondary | ICD-10-CM

## 2018-09-12 DIAGNOSIS — Z66 Do not resuscitate: Secondary | ICD-10-CM

## 2018-09-12 MED ORDER — PANTOPRAZOLE SODIUM 40 MG PO TBEC: 40.0000 mg | DELAYED_RELEASE_TABLET | Freq: Two times a day (BID) | ORAL | 1 refills | Status: AC

## 2018-09-12 NOTE — Progress Notes (Signed)
[place]  Telephone:([phone] [phone]   Name: Jamie Conner Date: 09/12/2018 MRN: [id number]  DOB: [birth year]  Patient Care Team: Jamie Conner., MD as PCP - General (Family Medicine) Jamie Ends, MD as Referring Physician (Obstetrics and Gynecology) Jamie Sickle, MD as Consulting Physician (Oncology) Jamie Skains, MD as Consulting Physician (Cardiology) Jamie Regal, NP as Nurse Practitioner (Nurse Practitioner) Jamie Silvas, MD as Consulting Physician (Gastroenterology)    REASON FOR CONSULTATION: Palliative Care consult requested for this 78 y.o. female with multiple medical problems including recurrent stage IV ovarian cancer (initially diagnosed May 2016) with recurrent peritoneal carcinomatosis with disease progression on several lines of chemotherapy currently on Taxotere.  PMH also notable for severe anemia requiring transfusion, peripheral neuropathy, CAD status post MI, hypothyroidism, hypertension, and history of breast cancer.  Most recent abdominal CT showed disease progression.  Patient was considered for trial at [facility] but patient ultimately did not financially qualify.  Patient was referred to palliative care for symptom management and to discuss goals.  SOCIAL HISTORY:    Patient lives at home with her husband.  She has a son and a daughter.  Her son lives in [place] and her daughter lives in [place].  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  She has a music degree from [employer].Marland Kitchen  ADVANCE DIRECTIVES:  On file  CODE STATUS: DNR  PAST MEDICAL HISTORY: Past Medical History:  Diagnosis Date  . Atrial fibrillation ([place])   . Breast cancer ([place]) 1998  . Cancer of breast ([place]) 1998   Left/radiation  . CINV (chemotherapy-induced nausea and vomiting)   . GERD (gastroesophageal reflux disease)   . Hyperlipidemia   . Hypothyroidism   .  Mitral insufficiency   . Ovarian cancer ([place])    s/p BSO optimal tumor debulking May 2016/chemo  . Personal history of chemotherapy since 2016   ovarian cancer  . Personal history of radiation therapy 1998  . PVC (premature ventricular contraction)     PAST SURGICAL HISTORY:  Past Surgical History:  Procedure Laterality Date  . ABDOMINAL HYSTERECTOMY    . BILATERAL SALPINGOOPHORECTOMY  May 2016   with optimal tumor debulking   . BREAST LUMPECTOMY Left 1998  . BREAST SURGERY     Left  . CESAREAN SECTION     x2  . CHOLECYSTECTOMY    . COLONOSCOPY  2006  . DEBULKING N/A 12/22/2014   Procedure: DEBULKING;  Surgeon: Jamie Ends, MD;  Location: [facility];  Service: Gynecology;  Laterality: N/A;  . LAPAROTOMY N/A 12/22/2014   Procedure: EXPLORATORY LAPAROTOMY;  Surgeon: Robert Bellow, MD;  Location: [facility];  Service: General;  Laterality: N/A;  . LAPAROTOMY WITH STAGING N/A 12/22/2014   Procedure: LAPAROTOMY WITH STAGING;  Surgeon: Jamie Ends, MD;  Location: [facility];  Service: Gynecology;  Laterality: N/A;  . LEFT HEART CATH AND CORONARY ANGIOGRAPHY N/A 12/10/2017   Procedure: LEFT HEART CATH AND CORONARY ANGIOGRAPHY;  Surgeon: Yolonda Kida, MD;  Location: [place] [facility];  Service: Cardiovascular;  Laterality: N/A;  . PERIPHERAL VASCULAR CATHETERIZATION Left 12/22/2014   Procedure: PORTA CATH INSERTION;  Surgeon: Robert Bellow, MD;  Location: [facility];  Service: General;  Laterality: Left;  . PORTACATH PLACEMENT Right 12/22/2014   Procedure: INSERTION PORT-A-CATH;  Surgeon: Robert Bellow, MD;  Location: [facility];  Service: General;  Laterality: Right;  . SALPINGOOPHORECTOMY Bilateral  12/22/2014   Procedure: SALPINGO OOPHORECTOMY;  Surgeon: Jamie Ends, MD;  Location: [facility];  Service: Gynecology;  Laterality: Bilateral;  . UPPER GI ENDOSCOPY  09/25/04   hiatus hernia, schatzki ring and a single gastric polyp    HEMATOLOGY/ONCOLOGY  HISTORY:  Oncology History   # April- MAY 2016- HIGH GRADE SEROUS OVARIAN CANCER STAGE IIIC; CA-125 +2300+;  s/p OPTIMAL DEBULKING SURGERY   # MAY 2016[name] DD [finished in Sep 19th 2017]  # MAY CT 2017- RECURRENT/Peritoneal carcinomatosis/pelvic implant ~1.2cm/Ca-125-34;   # July 3rd 2017- CARBO-DOXIL q 4W [with neulasta]; CT May 11 2016- slight increase peritoneal / stable nodules. Cont chemo [finished DEC 2017]. Last dose CARBO-07/11/2016; DEC 28th 2017 CT- Increase in peritoneal deposits; increasing Ca 125 [60s]- PLATINUM REFRACTORY  # Jan 2018- Taxol-Avastin x6 cycles; Aug 2018- CR; Aug 2018- start Avastin Maintenance; MARCH 2019- PROGRESSION- STOP avastin.   # MID April 2019- Zejula [300 mg/day]; 4 days- later STOPPED sec to Chest pain;  # June 1st week- Lynparza; AUG 2019- Progression of disease  # AUG, 21st 2019- START CARBO-GEM x4 cycles; NOV, 19th CT Progression/rising Ca-125.   #July 25, 2018- Taxotere weekly; last chemo Jan 10th 2020. Sec to progression; Ca 125- 4200.    # May 2019- TAKASUBO ? [s/p cardiac cath- Dr.Kowalski]  # G-1-2 hand foot syndrome-   # LEFT BREAST CA s/p Lumpec & RT s/p TAM   # Afib [cardiology]; JUNE 2017- MUGA 62%  Jan 2018- MOLECULAR TESTING- ATM DELETERIOUS HETEROZYGOUS mutation; TUMOR BRCA- NEG; Myriad genomic instability- NEGATIVE;   # NGS/Omniseq- BRCA-2 copy loss/ * [april2019]; NEG- BRCA Mutations/N-TRK/MSI-STABLE.  ---------------------------------------------------------------------    DIAGNOSIS: MAY 2017; RECURRENT OVARIAN CA-high-grade serous/platinum refractory  STAGE: IV    ;GOALS: PALLIATIVE  CURRENT/MOST RECENT THERAPY: Docetaxel weekly     Malignant neoplasm of ovary ([place])   04/08/2018 - 07/08/2018 Chemotherapy    The patient had palonosetron (ALOXI) injection 0.25 mg, 0.25 mg, Intravenous,  Once, 4 of 4 cycles Administration: 0.25 mg (04/09/2018), 0.25 mg (04/30/2018), 0.25 mg (06/04/2018), 0.25 mg  (06/25/2018) pegfilgrastim-cbqv (UDENYCA) injection 6 mg, 6 mg, Subcutaneous, Once, 3 of 3 cycles Administration: 6 mg (05/01/2018), 6 mg (06/05/2018), 6 mg (06/26/2018) CARBOplatin (PARAPLATIN) 430 mg in sodium chloride 0.9 % 250 mL chemo infusion, 430 mg (100 % of original dose 430.5 mg), Intravenous,  Once, 4 of 4 cycles Dose modification:   (original dose 430.5 mg, Cycle 1) Administration: 430 mg (04/09/2018), 430 mg (04/30/2018), 430 mg (06/04/2018), 430 mg (06/25/2018) gemcitabine (GEMZAR) 2,000 mg in sodium chloride 0.9 % 250 mL chemo infusion, 1,976 mg, Intravenous,  Once, 4 of 4 cycles Administration: 2,000 mg (04/09/2018), 2,000 mg (04/30/2018), 2,000 mg (06/04/2018), 2,000 mg (06/25/2018)  for chemotherapy treatment.      Ovarian cancer, unspecified laterality ([place])   07/25/2018 -  Chemotherapy    The patient had DOCEtaxel (TAXOTERE) 50 mg in sodium chloride 0.9 % 150 mL chemo infusion, 25 mg/m2 = 50 mg, Intravenous,  Once, 2 of 4 cycles Dose modification: 35 mg/m2 (original dose 25 mg/m2, Cycle 1, Reason: Provider Judgment) Administration: 50 mg (07/25/2018), 70 mg (08/01/2018), 70 mg (08/08/2018), 70 mg (08/22/2018), 70 mg (08/29/2018)  for chemotherapy treatment.      ALLERGIES:  is allergic to carafate [sucralfate] and sulfa antibiotics.  MEDICATIONS:  Current Outpatient Medications  Medication Sig Dispense Refill  . acetaminophen (TYLENOL) 500 MG tablet Take 500 mg by mouth every 6 (six) hours as needed.    Marland Kitchen alum &  mag hydroxide-simeth (MAALOX/MYLANTA) 200-200-20 MG/5ML suspension Take 15 mLs by mouth every 6 (six) hours as needed for indigestion or heartburn. 355 mL 0  . aspirin EC 81 MG tablet Take 81 mg by mouth daily.    . Cholecalciferol (VITAMIN D) 2000 units tablet Take 1 tablet (2,000 Units total) by mouth daily. 30 tablet 12  . Docosanol (ABREVA EX) Apply 1 application topically as needed. Fever blisters    . fluticasone (FLONASE) 50 MCG/ACT nasal spray Place 1 spray into  both nostrils daily as needed.     . lactulose (CHRONULAC) 10 GM/15ML solution Take 15 mLs (10 g total) by mouth daily as needed for moderate constipation. 236 mL 0  . levothyroxine (SYNTHROID, LEVOTHROID) 50 MCG tablet TAKE ONE TABLET ON AN EMPTY STOMACH WITHA GLASS OF WATER AT LEAST 30 TO 60 MINUTES BEFORE BREAKFAST 30 tablet 12  . lidocaine-prilocaine (EMLA) cream Apply to port area 30-45 mins prior to chemo. 30 g 5  . loratadine (CLARITIN) 10 MG tablet Take 10 mg by mouth daily as needed.     Marland Kitchen LORazepam (ATIVAN) 0.5 MG tablet TAKE ONE TABLET BY MOUTH AT BEDTIME AS NEEDED FOR ANXIETY/SLEEP 30 tablet 0  . Lysine 500 MG TABS Take 1 tablet by mouth daily as needed.     . metoprolol succinate (TOPROL-XL) 25 MG 24 hr tablet Take 1 tablet by mouth daily.    . Multiple Vitamin (MULTIVITAMIN) capsule Take 1 capsule by mouth daily.    . ondansetron (ZOFRAN) 4 MG tablet One pill 30-60 mins prior to taking zejula to prevent nausea/vomitting. (Patient taking differently: Take 4 mg by mouth every 8 (eight) hours as needed. One pill 30-60 mins prior to taking zejula to prevent nausea/vomitting.) 45 tablet 3  . pantoprazole (PROTONIX) 40 MG tablet Take 1 tablet (40 mg total) by mouth 2 (two) times daily. 60 tablet 1  . polyethylene glycol (MIRALAX / GLYCOLAX) packet Take 17 g by mouth daily as needed.    . prochlorperazine (COMPAZINE) 10 MG tablet Take 1 tablet (10 mg total) by mouth every 6 (six) hours as needed for nausea or vomiting. (Patient not taking: Reported on 09/10/2018) 30 tablet 4  . Simethicone (GAS-X PO) Take 1 tablet by mouth 2 (two) times daily as needed.      No current facility-administered medications for this visit.     VITAL SIGNS: BP 123/77   Pulse (!) 109   Temp 97.6 F (36.4 C) (Tympanic)   Resp 20   Ht '5\' 4"'$  (1.626 m)   Wt 195 lb (88.5 kg)   BMI 33.47 kg/m  Filed Weights   09/12/18 1333  Weight: 195 lb (88.5 kg)    Estimated body mass index is 33.47 kg/m as calculated  from the following:   Height as of this encounter: '5\' 4"'$  (1.626 m).   Weight as of this encounter: 195 lb (88.5 kg).  LABS: CBC:    Component Value Date/Time   WBC 4.2 09/05/2018 1007   HGB 9.5 (L) 09/05/2018 1007   HGB 14.7 12/14/2014 1037   HCT 31.3 (L) 09/05/2018 1007   HCT 45.1 12/14/2014 1037   PLT 269 09/05/2018 1007   PLT 284 12/14/2014 1037   MCV 89.9 09/05/2018 1007   MCV 93 12/14/2014 1037   NEUTROABS 1.8 09/05/2018 1007   NEUTROABS 6.7 (H) 12/14/2014 1037   LYMPHSABS 2.0 09/05/2018 1007   LYMPHSABS 3.2 12/14/2014 1037   MONOABS 0.5 09/05/2018 1007   MONOABS 1.4 (H) 12/14/2014 1037  EOSABS 0.0 09/05/2018 1007   EOSABS 0.1 12/14/2014 1037   BASOSABS 0.0 09/05/2018 1007   BASOSABS 0.1 12/14/2014 1037   Comprehensive Metabolic Panel:    Component Value Date/Time   NA 139 09/05/2018 1007   NA 140 12/14/2014 1037   K 4.1 09/05/2018 1007   K 4.4 12/14/2014 1037   CL 106 09/05/2018 1007   CL 106 12/14/2014 1037   CO2 26 09/05/2018 1007   CO2 26 12/14/2014 1037   BUN 15 09/05/2018 1007   BUN 12 12/14/2014 1037   CREATININE 1.01 (H) 09/05/2018 1007   CREATININE 0.97 12/14/2014 1037   GLUCOSE 107 (H) 09/05/2018 1007   GLUCOSE 87 12/14/2014 1037   CALCIUM 9.0 09/05/2018 1007   CALCIUM 9.2 12/14/2014 1037   AST 26 09/05/2018 1007   AST 20 12/14/2014 1037   ALT 13 09/05/2018 1007   ALT 11 (L) 12/14/2014 1037   ALKPHOS 69 09/05/2018 1007   ALKPHOS 138 (H) 12/14/2014 1037   BILITOT 0.5 09/05/2018 1007   BILITOT 0.5 12/14/2014 1037   PROT 6.4 (L) 09/05/2018 1007   PROT 7.3 12/14/2014 1037   ALBUMIN 3.1 (L) 09/05/2018 1007   ALBUMIN 3.8 12/14/2014 1037    RADIOGRAPHIC STUDIES: US Paracentesis  Result Date: 09/12/2018 INDICATION: History of ovarian cancer now with symptomatic presumably malignant ascites. Please perform ultrasound-guided paracentesis for therapeutic purposes. EXAM: ULTRASOUND-GUIDED PARACENTESIS COMPARISON:  CT abdomen and pelvis-07/08/2018  MEDICATIONS: None. COMPLICATIONS: None immediate. TECHNIQUE: Informed written consent was obtained from the patient after a discussion of the risks, benefits and alternatives to treatment. A timeout was performed prior to the initiation of the procedure. Initial ultrasound scanning demonstrates a small to moderate amount of ascites within the right lower abdominal quadrant. The right lower abdomen was prepped and draped in the usual sterile fashion. 1% lidocaine with epinephrine was used for local anesthesia. Under direct ultrasound guidance, a 19 gauge, 7-cm, Yueh catheter was introduced. An ultrasound image was saved for documentation purposed. The paracentesis was performed. The catheter was removed and a dressing was applied. The patient tolerated the procedure well without immediate post procedural complication. FINDINGS: A total of approximately 1.6 liters of slightly bloody ascitic fluid was removed. IMPRESSION: Successful ultrasound-guided paracentesis yielding 1.6 liters of peritoneal fluid. Electronically Signed   By: Sandi Mariscal M.D.   On: 09/12/2018 13:15    PERFORMANCE STATUS (ECOG) : 1 - Symptomatic but completely ambulatory  Review of Systems As noted above. Otherwise, a complete review of systems is negative.  Physical Exam General: NAD, frail appearing, thin Cardiovascular: regular rate and rhythm Pulmonary: clear ant fields Abdomen: soft, nontender, + bowel sounds Extremities: Trace edema, no joint deformities Skin: no rashes Neurological: Weakness but otherwise nonfocal  IMPRESSION: I met today with patient and her husband.  Patient was recently seen by Dr. Fransisca Connors.  It appears that patient is not a viable candidate for a clinical trial at [facility].  Patient relayed to me her understanding that treatment options have been essentially exhausted.  I asked if hospice care had been discussed and she said it had not.  She has follow-up visit scheduled next week with Dr. Rogue Bussing and I  will see her the same day to continue conversations regarding goals.  Patient has remained committed to the desire to pursue treatment and life prolonging therapies but she says "there comes a time when you must face reality."  Symptomatically, patient says she feels she is improved following her paracentesis earlier today.  She had  approximately 1.6 L withdrawn.  This is patient's first paracentesis and she will likely need future intervention.  She does not currently require paracentesis with the frequency necessitating a Tenckhoff catheter, although one could certainly be considered in the future.  Patient says her pain is reasonably controlled with as needed use of acetaminophen.  She has significant reflux symptoms that is causing her to lose sleep at night.  Reflux is likely complicated by her history of a large hiatal hernia and ascites.  She is taking Protonix in the morning and also using as needed Tums and Maalox.  Her head of the bed is at a slight incline and she sleeps on 2 pillows.  We discussed the importance of ensuring that she does not eat or drink large volumes of fluid within 2 hours of sleep.  I also suggested that she try a wedge pillow.  Will increase Protonix to twice daily.  Trial of a motility agent could be considered but I am hesitant considering that she has a recent history of diarrhea.  She has no nausea or vomiting.  We did discuss her concern regarding the future development of a gastrointestinal obstruction given progression of the cancer.  We talked about the symptoms associated with obstruction and some options for palliation of those symptoms.   We discussed CODE STATUS at length.  Patient says she would not want to be resuscitated nor have her life prolonged artificially on machines.  She would be in agreement with treating the treatable including short-term hospitalization if necessary.  However, she says she really would want avoid any aggressive particularly if it  were not associated with a chance meaningful improvement.  I completed a MOST form today. The patient and family outlined their wishes for the following treatment decisions:  Cardiopulmonary Resuscitation: Do Not Attempt Resuscitation (DNR/No CPR)  Medical Interventions: Limited Additional Interventions: Use medical treatment, IV fluids and cardiac monitoring as indicated, DO NOT USE intubation or mechanical ventilation. May consider use of less invasive airway support such as BiPAP or CPAP. Also provide comfort measures. Transfer to the hospital if indicated. Avoid intensive care.   Antibiotics: Antibiotics if indicated  IV Fluids: IV fluids if indicated  Feeding Tube: No feeding tube    PLAN: Medical treatment and plan as per oncology DNR MOST form completed as outlined above Acetaminophen as needed for pain Increase Protonix to '40mg'$  BID (Rx sent for #60) RTC in 1 week  Patient expressed understanding and was in agreement with this plan. She also understands that She can call clinic at any time with any questions, concerns, or complaints.   Time Total: 30 minutes  Visit consisted of counseling and education dealing with the complex and emotionally intense issues of symptom management and palliative care in the setting of serious and potentially life-threatening illness.Greater than 50%  of this time was spent counseling and coordinating care related to the above assessment and plan.  Signed by: Altha Harm, PhD, NP-C [phone] (Work Cell)

## 2018-09-12 NOTE — Procedures (Signed)
Pre Procedural Dx: History of ovarian cancer, now with presumed malignant ascites. Post Procedural Dx: Same  Successful US guided paracentesis yielding 1.6 L of slight blood tinged serous ascitic fluid.  EBL: None  Complications: None immediate  Ronny Bacon, MD Pager #: [phone]

## 2018-09-17 ENCOUNTER — Other Ambulatory Visit: Payer: Self-pay | Admitting: *Deleted

## 2018-09-17 ENCOUNTER — Telehealth: Payer: Self-pay | Admitting: *Deleted

## 2018-09-17 ENCOUNTER — Other Ambulatory Visit: Payer: Self-pay

## 2018-09-17 ENCOUNTER — Telehealth: Payer: Self-pay | Admitting: Hospice and Palliative Medicine

## 2018-09-17 DIAGNOSIS — C569 Malignant neoplasm of unspecified ovary: Secondary | ICD-10-CM

## 2018-09-17 DIAGNOSIS — R180 Malignant ascites: Secondary | ICD-10-CM

## 2018-09-17 NOTE — Telephone Encounter (Signed)
Contacted patient with u/s paracentesis apts times for 09/19/2018 with arrival time of 1230 pm. She gave verbal understanding of the plan of care. She will keep her f/u apts in cancer center with Dr. Rogue Bussing and [facility] as scheduled.

## 2018-09-17 NOTE — Telephone Encounter (Signed)
Patient called in to triage today. I called and spoke with her by phone. She says that she has had increased weight and abdominal distension. She has some shortness of breath from abdominal distension. No fever or chills reported. She is s/p recent paracentesis with 1.6L removed. She tolerated it well. Symptoms sounds consistent with reaccumulation of ascites.   Case discussed with Dr. Rogue Bussing. Will schedule for repeat paracentesis and then talk with her about options for future management such as a Tenckhoff catheter.

## 2018-09-19 ENCOUNTER — Inpatient Hospital Stay: Payer: PPO | Admitting: Internal Medicine

## 2018-09-19 ENCOUNTER — Inpatient Hospital Stay: Payer: PPO

## 2018-09-19 ENCOUNTER — Inpatient Hospital Stay (HOSPITAL_BASED_OUTPATIENT_CLINIC_OR_DEPARTMENT_OTHER): Payer: PPO | Admitting: Hospice and Palliative Medicine

## 2018-09-19 ENCOUNTER — Encounter: Payer: Self-pay | Admitting: Internal Medicine

## 2018-09-19 ENCOUNTER — Ambulatory Visit
Admission: RE | Admit: 2018-09-19 | Discharge: 2018-09-19 | Disposition: A | Discharge status: Home / Self Care | Payer: PPO | Source: Ambulatory Visit | Attending: Internal Medicine | Admitting: Internal Medicine

## 2018-09-19 VITALS — BP 138/77 | HR 108 | Temp 97.2°F | Resp 16 | Wt 200.2 lb

## 2018-09-19 DIAGNOSIS — Z9221 Personal history of antineoplastic chemotherapy: Secondary | ICD-10-CM

## 2018-09-19 DIAGNOSIS — C569 Malignant neoplasm of unspecified ovary: Secondary | ICD-10-CM

## 2018-09-19 DIAGNOSIS — R300 Dysuria: Secondary | ICD-10-CM | POA: Diagnosis not present

## 2018-09-19 DIAGNOSIS — R600 Localized edema: Secondary | ICD-10-CM

## 2018-09-19 DIAGNOSIS — G4700 Insomnia, unspecified: Secondary | ICD-10-CM | POA: Diagnosis not present

## 2018-09-19 DIAGNOSIS — Z515 Encounter for palliative care: Secondary | ICD-10-CM

## 2018-09-19 DIAGNOSIS — R188 Other ascites: Secondary | ICD-10-CM | POA: Diagnosis not present

## 2018-09-19 DIAGNOSIS — Z7982 Long term (current) use of aspirin: Secondary | ICD-10-CM

## 2018-09-19 DIAGNOSIS — Z79899 Other long term (current) drug therapy: Secondary | ICD-10-CM | POA: Diagnosis not present

## 2018-09-19 DIAGNOSIS — Z923 Personal history of irradiation: Secondary | ICD-10-CM | POA: Diagnosis not present

## 2018-09-19 DIAGNOSIS — Z5111 Encounter for antineoplastic chemotherapy: Secondary | ICD-10-CM | POA: Diagnosis not present

## 2018-09-19 DIAGNOSIS — R180 Malignant ascites: Secondary | ICD-10-CM

## 2018-09-19 LAB — URINALYSIS, COMPLETE (UACMP) WITH MICROSCOPIC
Bacteria, UA: NONE SEEN
Bilirubin Urine: NEGATIVE
GLUCOSE, UA: NEGATIVE mg/dL
Hgb urine dipstick: NEGATIVE
Ketones, ur: NEGATIVE mg/dL
Nitrite: NEGATIVE
PROTEIN: NEGATIVE mg/dL
Specific Gravity, Urine: 1.021 (ref 1.005–1.030)
pH: 6 (ref 5.0–8.0)

## 2018-09-19 LAB — COMPREHENSIVE METABOLIC PANEL
ALT: 14 U/L (ref 0–44)
AST: 27 U/L (ref 15–41)
Albumin: 2.3 g/dL — ABNORMAL LOW (ref 3.5–5.0)
Alkaline Phosphatase: 85 U/L (ref 38–126)
Anion gap: 10 (ref 5–15)
BUN: 20 mg/dL (ref 8–23)
CO2: 22 mmol/L (ref 22–32)
CREATININE: 1.13 mg/dL — AB (ref 0.44–1.00)
Calcium: 8.1 mg/dL — ABNORMAL LOW (ref 8.9–10.3)
Chloride: 103 mmol/L (ref 98–111)
GFR calc non Af Amer: 48 mL/min — ABNORMAL LOW (ref >60)
GFR, EST AFRICAN AMERICAN: 55 mL/min — AB (ref >60)
Glucose, Bld: 115 mg/dL — ABNORMAL HIGH (ref 70–99)
Potassium: 4.2 mmol/L (ref 3.5–5.1)
Sodium: 135 mmol/L (ref 135–145)
Total Bilirubin: 0.5 mg/dL (ref 0.3–1.2)
Total Protein: 5.8 g/dL — ABNORMAL LOW (ref 6.5–8.1)

## 2018-09-19 LAB — CBC WITH DIFFERENTIAL/PLATELET
ABS IMMATURE GRANULOCYTES: 0.02 10*3/uL (ref 0.00–0.07)
BASOS PCT: 0 %
Basophils Absolute: 0 10*3/uL (ref 0.0–0.1)
Eosinophils Absolute: 0 10*3/uL (ref 0.0–0.5)
Eosinophils Relative: 0 %
HCT: 33.2 % — ABNORMAL LOW (ref 36.0–46.0)
Hemoglobin: 9.9 g/dL — ABNORMAL LOW (ref 12.0–15.0)
Immature Granulocytes: 0 %
Lymphocytes Relative: 32 %
Lymphs Abs: 2 10*3/uL (ref 0.7–4.0)
MCH: 26.1 pg (ref 26.0–34.0)
MCHC: 29.8 g/dL — ABNORMAL LOW (ref 30.0–36.0)
MCV: 87.4 fL (ref 80.0–100.0)
Monocytes Absolute: 0.7 10*3/uL (ref 0.1–1.0)
Monocytes Relative: 12 %
Neutro Abs: 3.5 10*3/uL (ref 1.7–7.7)
Neutrophils Relative %: 56 %
Platelets: 372 10*3/uL (ref 150–400)
RBC: 3.8 MIL/uL — ABNORMAL LOW (ref 3.87–5.11)
RDW: 20.2 % — ABNORMAL HIGH (ref 11.5–15.5)
WBC: 6.2 10*3/uL (ref 4.0–10.5)
nRBC: 0 % (ref 0.0–0.2)

## 2018-09-19 MED ORDER — LORAZEPAM 0.5 MG PO TABS: ORAL_TABLET | ORAL | 0 refills | Status: AC

## 2018-09-19 MED ORDER — FUROSEMIDE 20 MG PO TABS: 10.0000 mg | ORAL_TABLET | Freq: Every day | ORAL | 0 refills | Status: AC | PRN

## 2018-09-19 NOTE — Progress Notes (Signed)
[place]  [phone] [phone]   Name: Jamie Conner Date: 09/19/2018 MRN: [id number]  DOB: 07-Sep-1940  Patient Care Team: Jerrol Banana., MD as PCP - General (Family Medicine) Gillis Ends, MD as Referring Physician (Obstetrics and Gynecology) Cammie Sickle, MD as Consulting Physician (Oncology) Corey Skains, MD as Consulting Physician (Cardiology) Marval Regal, NP as Nurse Practitioner (Nurse Practitioner) Manya Silvas, MD as Consulting Physician (Gastroenterology)    REASON FOR CONSULTATION: Palliative Care consult requested for this 78 y.o. female with multiple medical problems including recurrent stage IV ovarian cancer (initially diagnosed May 2016) with recurrent peritoneal carcinomatosis with disease progression on several lines of chemotherapy currently on Taxotere.  PMH also notable for severe anemia requiring transfusion, peripheral neuropathy, CAD status post MI, hypothyroidism, hypertension, and history of breast cancer.  Most recent abdominal CT showed disease progression.  Patient was considered for trial at [facility] but patient ultimately did not financially qualify.  Patient was referred to palliative care for symptom management and to discuss goals.  SOCIAL HISTORY:    Patient lives at home with her husband.  She has a son and a daughter.  Her son lives in [place] and her daughter lives in [place].  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  She has a music degree from [employer].Marland Kitchen  ADVANCE DIRECTIVES:  On file  CODE STATUS: DNR  PAST MEDICAL HISTORY: Past Medical History:  Diagnosis Date  . Atrial fibrillation ([place])   . Breast cancer ([place]) 1998  . Cancer of breast ([place]) 1998   Left/radiation  . CINV (chemotherapy-induced nausea and vomiting)   . GERD (gastroesophageal reflux disease)   . Hyperlipidemia   . Hypothyroidism   .  Mitral insufficiency   . Ovarian cancer ([place])    s/p BSO optimal tumor debulking May 2016/chemo  . Personal history of chemotherapy since 2016   ovarian cancer  . Personal history of radiation therapy 1998  . PVC (premature ventricular contraction)     PAST SURGICAL HISTORY:  Past Surgical History:  Procedure Laterality Date  . ABDOMINAL HYSTERECTOMY    . BILATERAL SALPINGOOPHORECTOMY  May 2016   with optimal tumor debulking   . BREAST LUMPECTOMY Left 1998  . BREAST SURGERY     Left  . CESAREAN SECTION     x2  . CHOLECYSTECTOMY    . COLONOSCOPY  2006  . DEBULKING N/A 12/22/2014   Procedure: DEBULKING;  Surgeon: Gillis Ends, MD;  Location: [facility] ORS;  Service: Gynecology;  Laterality: N/A;  . LAPAROTOMY N/A 12/22/2014   Procedure: EXPLORATORY LAPAROTOMY;  Surgeon: Robert Bellow, MD;  Location: [facility] ORS;  Service: General;  Laterality: N/A;  . LAPAROTOMY WITH STAGING N/A 12/22/2014   Procedure: LAPAROTOMY WITH STAGING;  Surgeon: Gillis Ends, MD;  Location: [facility] ORS;  Service: Gynecology;  Laterality: N/A;  . LEFT HEART CATH AND CORONARY ANGIOGRAPHY N/A 12/10/2017   Procedure: LEFT HEART CATH AND CORONARY ANGIOGRAPHY;  Surgeon: Yolonda Kida, MD;  Location: [place] CV LAB;  Service: Cardiovascular;  Laterality: N/A;  . PERIPHERAL VASCULAR CATHETERIZATION Left 12/22/2014   Procedure: PORTA CATH INSERTION;  Surgeon: Robert Bellow, MD;  Location: [facility] ORS;  Service: General;  Laterality: Left;  . PORTACATH PLACEMENT Right 12/22/2014   Procedure: INSERTION PORT-A-CATH;  Surgeon: Robert Bellow, MD;  Location: [facility] ORS;  Service: General;  Laterality: Right;  . SALPINGOOPHORECTOMY Bilateral  12/22/2014   Procedure: SALPINGO OOPHORECTOMY;  Surgeon: Gillis Ends, MD;  Location: [facility] ORS;  Service: Gynecology;  Laterality: Bilateral;  . UPPER GI ENDOSCOPY  09/25/04   hiatus hernia, schatzki ring and a single gastric polyp    HEMATOLOGY/ONCOLOGY  HISTORY:  Oncology History   # April- MAY 2016- HIGH GRADE SEROUS OVARIAN CANCER STAGE IIIC; CA-125 +2300+;  s/p OPTIMAL DEBULKING SURGERY   # MAY 2016[name] [finished in Sep 19th 2017]  # MAY CT 2017- RECURRENT/Peritoneal carcinomatosis/pelvic implant ~1.2cm/Ca-125-34;   # July 3rd 2017- CARBO-DOXIL q 4W [with neulasta]; CT May 11 2016- slight increase peritoneal / stable nodules. Cont chemo [finished DEC 2017]. Last dose CARBO-07/11/2016; DEC 28th 2017 CT- Increase in peritoneal deposits; increasing Ca 125 [60s]- PLATINUM REFRACTORY  # Jan 2018- Taxol-Avastin x6 cycles; Aug 2018- CR; Aug 2018- start Avastin Maintenance; MARCH 2019- PROGRESSION- STOP avastin.   # MID April 2019- Zejula [300 mg/day]; 4 days- later STOPPED sec to Chest pain;  # June 1st week- Lynparza; AUG 2019- Progression of disease  # AUG, 21st 2019- START CARBO-GEM x4 cycles; NOV, 19th CT Progression/rising Ca-125.   #July 25, 2018- Taxotere weekly; last chemo Jan 10th 2020. Sec to progression; Ca 125- 4200.    # May 2019- TAKASUBO ? [s/p cardiac cath- Dr.Kowalski]  # G-1-2 hand foot syndrome-   # LEFT BREAST CA s/p Lumpec & RT s/p TAM   # Afib [cardiology]; JUNE 2017- MUGA 62%  Jan 2018- MOLECULAR TESTING- ATM DELETERIOUS HETEROZYGOUS mutation; TUMOR BRCA- NEG; Myriad genomic instability- NEGATIVE;   # NGS/Omniseq- BRCA-2 copy loss/ * [april2019]; NEG- BRCA Mutations/N-TRK/MSI-STABLE.  ---------------------------------------------------------------------    DIAGNOSIS: MAY 2017; RECURRENT OVARIAN CA-high-grade serous/platinum refractory  STAGE: IV    ;GOALS: PALLIATIVE  CURRENT/MOST RECENT THERAPY: Docetaxel weekly     Malignant neoplasm of ovary ([place])   04/08/2018 - 07/08/2018 Chemotherapy    The patient had palonosetron (ALOXI) injection 0.25 mg, 0.25 mg, Intravenous,  Once, 4 of 4 cycles Administration: 0.25 mg (04/09/2018), 0.25 mg (04/30/2018), 0.25 mg (06/04/2018), 0.25 mg  (06/25/2018) pegfilgrastim-cbqv (UDENYCA) injection 6 mg, 6 mg, Subcutaneous, Once, 3 of 3 cycles Administration: 6 mg (05/01/2018), 6 mg (06/05/2018), 6 mg (06/26/2018) CARBOplatin (PARAPLATIN) 430 mg in sodium chloride 0.9 % 250 mL chemo infusion, 430 mg (100 % of original dose 430.5 mg), Intravenous,  Once, 4 of 4 cycles Dose modification:   (original dose 430.5 mg, Cycle 1) Administration: 430 mg (04/09/2018), 430 mg (04/30/2018), 430 mg (06/04/2018), 430 mg (06/25/2018) gemcitabine (GEMZAR) 2,000 mg in sodium chloride 0.9 % 250 mL chemo infusion, 1,976 mg, Intravenous,  Once, 4 of 4 cycles Administration: 2,000 mg (04/09/2018), 2,000 mg (04/30/2018), 2,000 mg (06/04/2018), 2,000 mg (06/25/2018)  for chemotherapy treatment.      Ovarian cancer, unspecified laterality ([place])   07/25/2018 -  Chemotherapy    The patient had DOCEtaxel (TAXOTERE) 50 mg in sodium chloride 0.9 % 150 mL chemo infusion, 25 mg/m2 = 50 mg, Intravenous,  Once, 2 of 4 cycles Dose modification: 35 mg/m2 (original dose 25 mg/m2, Cycle 1, Reason: Provider Judgment) Administration: 50 mg (07/25/2018), 70 mg (08/01/2018), 70 mg (08/08/2018), 70 mg (08/22/2018), 70 mg (08/29/2018)  for chemotherapy treatment.      ALLERGIES:  is allergic to carafate [sucralfate] and sulfa antibiotics.  MEDICATIONS:  Current Outpatient Medications  Medication Sig Dispense Refill  . acetaminophen (TYLENOL) 500 MG tablet Take 500 mg by mouth every 6 (six) hours as needed.    Marland Kitchen alum &  mag hydroxide-simeth (MAALOX/MYLANTA) 200-200-20 MG/5ML suspension Take 15 mLs by mouth every 6 (six) hours as needed for indigestion or heartburn. 355 mL 0  . aspirin EC 81 MG tablet Take 81 mg by mouth daily.    . Cholecalciferol (VITAMIN D) 2000 units tablet Take 1 tablet (2,000 Units total) by mouth daily. 30 tablet 12  . Docosanol (ABREVA EX) Apply 1 application topically as needed. Fever blisters    . fluticasone (FLONASE) 50 MCG/ACT nasal spray Place 1 spray into  both nostrils daily as needed.     . lactulose (CHRONULAC) 10 GM/15ML solution Take 15 mLs (10 g total) by mouth daily as needed for moderate constipation. 236 mL 0  . levothyroxine (SYNTHROID, LEVOTHROID) 50 MCG tablet TAKE ONE TABLET ON AN EMPTY STOMACH WITHA GLASS OF WATER AT LEAST 30 TO 60 MINUTES BEFORE BREAKFAST 30 tablet 12  . lidocaine-prilocaine (EMLA) cream Apply to port area 30-45 mins prior to chemo. 30 g 5  . loratadine (CLARITIN) 10 MG tablet Take 10 mg by mouth daily as needed.     Marland Kitchen LORazepam (ATIVAN) 0.5 MG tablet TAKE ONE TABLET BY MOUTH AT BEDTIME AS NEEDED FOR ANXIETY/SLEEP 30 tablet 0  . Lysine 500 MG TABS Take 1 tablet by mouth daily as needed.     . metoprolol succinate (TOPROL-XL) 25 MG 24 hr tablet Take 1 tablet by mouth daily.    . Multiple Vitamin (MULTIVITAMIN) capsule Take 1 capsule by mouth daily.    . ondansetron (ZOFRAN) 4 MG tablet One pill 30-60 mins prior to taking zejula to prevent nausea/vomitting. (Patient taking differently: Take 4 mg by mouth every 8 (eight) hours as needed. One pill 30-60 mins prior to taking zejula to prevent nausea/vomitting.) 45 tablet 3  . pantoprazole (PROTONIX) 40 MG tablet Take 1 tablet (40 mg total) by mouth 2 (two) times daily. 60 tablet 1  . polyethylene glycol (MIRALAX / GLYCOLAX) packet Take 17 g by mouth daily as needed.    . prochlorperazine (COMPAZINE) 10 MG tablet Take 1 tablet (10 mg total) by mouth every 6 (six) hours as needed for nausea or vomiting. 30 tablet 4  . Simethicone (GAS-X PO) Take 1 tablet by mouth 2 (two) times daily as needed.      No current facility-administered medications for this visit.     VITAL SIGNS: There were no vitals taken for this visit. There were no vitals filed for this visit.  Estimated body mass index is 34.36 kg/m as calculated from the following:   Height as of 09/12/18: '5\' 4"'$  (1.626 m).   Weight as of an earlier encounter on 09/19/18: 200 lb 3.2 oz (90.8 kg).  LABS: CBC:     Component Value Date/Time   WBC 6.2 09/19/2018 0945   HGB 9.9 (L) 09/19/2018 0945   HGB 14.7 12/14/2014 1037   HCT 33.2 (L) 09/19/2018 0945   HCT 45.1 12/14/2014 1037   PLT 372 09/19/2018 0945   PLT 284 12/14/2014 1037   MCV 87.4 09/19/2018 0945   MCV 93 12/14/2014 1037   NEUTROABS 3.5 09/19/2018 0945   NEUTROABS 6.7 (H) 12/14/2014 1037   LYMPHSABS 2.0 09/19/2018 0945   LYMPHSABS 3.2 12/14/2014 1037   MONOABS 0.7 09/19/2018 0945   MONOABS 1.4 (H) 12/14/2014 1037   EOSABS 0.0 09/19/2018 0945   EOSABS 0.1 12/14/2014 1037   BASOSABS 0.0 09/19/2018 0945   BASOSABS 0.1 12/14/2014 1037   Comprehensive Metabolic Panel:    Component Value Date/Time   NA 135  09/19/2018 0945   NA 140 12/14/2014 1037   K 4.2 09/19/2018 0945   K 4.4 12/14/2014 1037   CL 103 09/19/2018 0945   CL 106 12/14/2014 1037   CO2 22 09/19/2018 0945   CO2 26 12/14/2014 1037   BUN 20 09/19/2018 0945   BUN 12 12/14/2014 1037   CREATININE 1.13 (H) 09/19/2018 0945   CREATININE 0.97 12/14/2014 1037   GLUCOSE 115 (H) 09/19/2018 0945   GLUCOSE 87 12/14/2014 1037   CALCIUM 8.1 (L) 09/19/2018 0945   CALCIUM 9.2 12/14/2014 1037   AST 27 09/19/2018 0945   AST 20 12/14/2014 1037   ALT 14 09/19/2018 0945   ALT 11 (L) 12/14/2014 1037   ALKPHOS 85 09/19/2018 0945   ALKPHOS 138 (H) 12/14/2014 1037   BILITOT 0.5 09/19/2018 0945   BILITOT 0.5 12/14/2014 1037   PROT 5.8 (L) 09/19/2018 0945   PROT 7.3 12/14/2014 1037   ALBUMIN 2.3 (L) 09/19/2018 0945   ALBUMIN 3.8 12/14/2014 1037    RADIOGRAPHIC STUDIES: US Paracentesis  Result Date: 09/12/2018 INDICATION: History of ovarian cancer now with symptomatic presumably malignant ascites. Please perform ultrasound-guided paracentesis for therapeutic purposes. EXAM: ULTRASOUND-GUIDED PARACENTESIS COMPARISON:  CT abdomen and pelvis-07/08/2018 MEDICATIONS: None. COMPLICATIONS: None immediate. TECHNIQUE: Informed written consent was obtained from the patient after a discussion  of the risks, benefits and alternatives to treatment. A timeout was performed prior to the initiation of the procedure. Initial ultrasound scanning demonstrates a small to moderate amount of ascites within the right lower abdominal quadrant. The right lower abdomen was prepped and draped in the usual sterile fashion. 1% lidocaine with epinephrine was used for local anesthesia. Under direct ultrasound guidance, a 19 gauge, 7-cm, Yueh catheter was introduced. An ultrasound image was saved for documentation purposed. The paracentesis was performed. The catheter was removed and a dressing was applied. The patient tolerated the procedure well without immediate post procedural complication. FINDINGS: A total of approximately 1.6 liters of slightly bloody ascitic fluid was removed. IMPRESSION: Successful ultrasound-guided paracentesis yielding 1.6 liters of peritoneal fluid. Electronically Signed   By: Sandi Mariscal M.D.   On: 09/12/2018 13:15    PERFORMANCE STATUS (ECOG) : 1 - Symptomatic but completely ambulatory  Review of Systems As noted above. Otherwise, a complete review of systems is negative.  Physical Exam General: NAD, frail appearing, thin Cardiovascular: regular rate and rhythm Pulmonary: clear ant fields Abdomen: soft, some tenderness to deep palp, distended Extremities: Trace edema, no joint deformities Skin: no rashes Neurological: Weakness but otherwise nonfocal  IMPRESSION: I met today with patient and her husband for routine follow up regarding goals and symptoms.    Patient has had rapid reaccumulation of ascites in the past week following previous paracentesis on 09/12/2018.  Her increasing abdominal distention is associated with pain across the superior aspect of her abdomen.  She is pending repeat therapeutic paracentesis later today.  Patient is also interested in pursuing a Tenckhoff catheter to allow for management of ascites in the home.  Patient met with Dr. Rogue Bussing today  and has decided to pursue hospice.  A hospice referral is being sent. We discussed her emotional coping.  She became somewhat tearful as she described her husband being left alone after her passing.  Patient requests refills of her lorazepam and Lasix.  Patient describes improved reflux on twice daily Protonix.   PLAN: Referral to hospice DNR Continue Protonix to '40mg'$  BID Lasix '10mg'$  daily prn for edema Lorazepam 0.'5mg'$  qhs (#30) Pending therapeutic paracentesis  today Tenckhoff catheter for home management of ascites  Patient expressed understanding and was in agreement with this plan. She also understands that She can call clinic at any time with any questions, concerns, or complaints.   Time Total: 20 minutes  Visit consisted of counseling and education dealing with the complex and emotionally intense issues of symptom management and palliative care in the setting of serious and potentially life-threatening illness.Greater than 50%  of this time was spent counseling and coordinating care related to the above assessment and plan.  Signed by: Altha Harm, PhD, NP-C [phone] (Work Cell)

## 2018-09-19 NOTE — Procedures (Signed)
Ultrasound-guided  therapeutic paracentesis performed yielding 1.7  liters of blood-tinged fluid. No immediate complications. EBL none.   

## 2018-09-19 NOTE — Assessment & Plan Note (Addendum)
#  RECURRENT HIGH GRADE SEROUS ovarian cancer/platinum refractory November 18th-CT abdomen pelvis shows progression-peritoneal/serosal; currently on Taxotere.  Status post 3 cycles.  #Concern for progressive disease given rising tumor marker; CEA 125 4000+.  Patient also needing recurrent paracentesis for ascites.  Discussed the possibility of clinical trial at [facility] using monoclonal antibody; however issues with insurance/patient reluctance.  Patient also met with Dr. Elsie Stain feels palliative care/hospice is most reasonable given patient's current situation.  #Discontinue further chemotherapy for above reasons.  Patient also not too keen on further lines of therapy.  #Recurrent ascites-x2 paracentesis; discussed regarding Pleurx catheter placement.  Patient is interested.  We will make a referral to IR.  # Peripheral neuropathy grade 1.  #Bilateral swelling in the legs-secondary to third spacing; no clinical concerns for DVT.  Recommend Lasix 10 mg a day for symptomatic control.  #Discussed the overall poor prognosis/natural history of disease and the life expectancy in general of few months.  Discussed with Josh borders/palliative care.   # 40 minutes face-to-face with the patient discussing the above plan of care; more than 50% of time spent on prognosis/ natural history; counseling and coordination.   # DISPOSITION:  # hospice referral # pleurex cath placement- # follow up with MD as needed- Dr.B

## 2018-09-19 NOTE — Progress Notes (Signed)
[facility] OFFICE PROGRESS NOTE  Patient Care Team: Jerrol Banana., MD as PCP - General (Family Medicine) Gillis Ends, MD as Referring Physician (Obstetrics and Gynecology) Cammie Sickle, MD as Consulting Physician (Oncology) Corey Skains, MD as Consulting Physician (Cardiology) Marval Regal, NP as Nurse Practitioner (Nurse Practitioner) Manya Silvas, MD as Consulting Physician (Gastroenterology)  Cancer Staging Malignant neoplasm of ovary [facility]) Staging form: Ovary, AJCC 7th Edition - Clinical: Stage IIIC (T3c, N0, M0) - Unsigned    Oncology History   # April- MAY 2016- HIGH GRADE SEROUS OVARIAN CANCER STAGE IIIC; CA-125 +2300+;  s/p OPTIMAL DEBULKING SURGERY   # MAY 2016[name] [finished in Sep 19th 2017]  # MAY CT 2017- RECURRENT/Peritoneal carcinomatosis/pelvic implant ~1.2cm/Ca-125-34;   # July 3rd 2017- CARBO-DOXIL q 4W [with neulasta]; CT May 11 2016- slight increase peritoneal / stable nodules. Cont chemo [finished DEC 2017]. Last dose CARBO-07/11/2016; DEC 28th 2017 CT- Increase in peritoneal deposits; increasing Ca 125 [60s]- PLATINUM REFRACTORY  # Jan 2018- Taxol-Avastin x6 cycles; Aug 2018- CR; Aug 2018- start Avastin Maintenance; MARCH 2019- PROGRESSION- STOP avastin.   # MID April 2019- Zejula [300 mg/day]; 4 days- later STOPPED sec to Chest pain;  # June 1st week- Lynparza; AUG 2019- Progression of disease  # AUG, 21st 2019- START CARBO-GEM x4 cycles; NOV, 19th CT Progression/rising Ca-125.   #July 25, 2018- Taxotere weekly; last chemo Jan 10th 2020. Sec to progression; Ca 125- 4200.    # May 2019- TAKASUBO ? [s/p cardiac cath- Dr.Kowalski]  # G-1-2 hand foot syndrome-   # LEFT BREAST CA s/p Lumpec & RT s/p TAM   # Afib [cardiology]; JUNE 2017- MUGA 62%  Jan 2018- MOLECULAR TESTING- ATM DELETERIOUS HETEROZYGOUS mutation; TUMOR BRCA- NEG; Myriad genomic instability- NEGATIVE;   #  NGS/Omniseq- BRCA-2 copy loss/ * [april2019]; NEG- BRCA Mutations/N-TRK/MSI-STABLE.  ---------------------------------------------------------------------    DIAGNOSIS: MAY 2017; RECURRENT OVARIAN CA-high-grade serous/platinum refractory  STAGE: IV    ;GOALS: PALLIATIVE  CURRENT/MOST RECENT THERAPY: Docetaxel weekly     Malignant neoplasm of ovary ([place])   04/08/2018 - 07/08/2018 Chemotherapy    The patient had palonosetron (ALOXI) injection 0.25 mg, 0.25 mg, Intravenous,  Once, 4 of 4 cycles Administration: 0.25 mg (04/09/2018), 0.25 mg (04/30/2018), 0.25 mg (06/04/2018), 0.25 mg (06/25/2018) pegfilgrastim-cbqv (UDENYCA) injection 6 mg, 6 mg, Subcutaneous, Once, 3 of 3 cycles Administration: 6 mg (05/01/2018), 6 mg (06/05/2018), 6 mg (06/26/2018) CARBOplatin (PARAPLATIN) 430 mg in sodium chloride 0.9 % 250 mL chemo infusion, 430 mg (100 % of original dose 430.5 mg), Intravenous,  Once, 4 of 4 cycles Dose modification:   (original dose 430.5 mg, Cycle 1) Administration: 430 mg (04/09/2018), 430 mg (04/30/2018), 430 mg (06/04/2018), 430 mg (06/25/2018) gemcitabine (GEMZAR) 2,000 mg in sodium chloride 0.9 % 250 mL chemo infusion, 1,976 mg, Intravenous,  Once, 4 of 4 cycles Administration: 2,000 mg (04/09/2018), 2,000 mg (04/30/2018), 2,000 mg (06/04/2018), 2,000 mg (06/25/2018)  for chemotherapy treatment.      Ovarian cancer, unspecified laterality ([place])   07/25/2018 -  Chemotherapy    The patient had DOCEtaxel (TAXOTERE) 50 mg in sodium chloride 0.9 % 150 mL chemo infusion, 25 mg/m2 = 50 mg, Intravenous,  Once, 2 of 4 cycles Dose modification: 35 mg/m2 (original dose 25 mg/m2, Cycle 1, Reason: Provider Judgment) Administration: 50 mg (07/25/2018), 70 mg (08/01/2018), 70 mg (08/08/2018), 70 mg (08/22/2018), 70 mg (08/29/2018)  for chemotherapy treatment.  INTERVAL HISTORY:  BRANDELYN HENNE 78 y.o.  female pleasant patient above history of high-grade serous ovarian cancer-platinum refractory  currently on weekly Taxotere is here for follow-up.  Given the rising CEA 125; worsening ascites/need for paracentesis-it is felt patient is progressing on Taxotere.   Patient was recently evaluated by Dr. Fransisca Connors; and and discussed the possibility of clinical trial at [facility] using antibody drug conjugate.  But patient is reluctant/insurance issues.  Patient last had paracentesis done approximately 4 days ago.  She already feels bloated again.  She is due for paracentesis this afternoon.  Overall feels poorly.  She is in a wheelchair.  Her mobility is been restricted.  Review of Systems  Constitutional: Positive for malaise/fatigue. Negative for chills, diaphoresis, fever and weight loss.  HENT: Negative for nosebleeds and sore throat.   Eyes: Negative for double vision.  Respiratory: Negative for cough, hemoptysis, sputum production, shortness of breath and wheezing.   Cardiovascular: Positive for leg swelling. Negative for chest pain, palpitations and orthopnea.  Gastrointestinal: Positive for abdominal pain. Negative for blood in stool, diarrhea, heartburn, melena, nausea and vomiting.       Abdominal distention.  Genitourinary: Negative for dysuria, frequency and urgency.  Musculoskeletal: Negative for back pain and joint pain.  Skin: Negative.  Negative for itching and rash.  Neurological: Positive for tingling. Negative for dizziness, focal weakness, weakness and headaches.  Endo/Heme/Allergies: Does not bruise/bleed easily.  Psychiatric/Behavioral: Negative for depression. The patient is not nervous/anxious and does not have insomnia.       PAST MEDICAL HISTORY :  Past Medical History:  Diagnosis Date  . Atrial fibrillation ([place])   . Breast cancer ([place]) 1998  . Cancer of breast ([place]) 1998   Left/radiation  . CINV (chemotherapy-induced nausea and vomiting)   . GERD (gastroesophageal reflux disease)   . Hyperlipidemia   . Hypothyroidism   . Mitral insufficiency   . Ovarian  cancer ([place])    s/p BSO optimal tumor debulking May 2016/chemo  . Personal history of chemotherapy since 2016   ovarian cancer  . Personal history of radiation therapy 1998  . PVC (premature ventricular contraction)     PAST SURGICAL HISTORY :   Past Surgical History:  Procedure Laterality Date  . ABDOMINAL HYSTERECTOMY    . BILATERAL SALPINGOOPHORECTOMY  May 2016   with optimal tumor debulking   . BREAST LUMPECTOMY Left 1998  . BREAST SURGERY     Left  . CESAREAN SECTION     x2  . CHOLECYSTECTOMY    . COLONOSCOPY  2006  . DEBULKING N/A 12/22/2014   Procedure: DEBULKING;  Surgeon: Gillis Ends, MD;  Location: [facility] ORS;  Service: Gynecology;  Laterality: N/A;  . LAPAROTOMY N/A 12/22/2014   Procedure: EXPLORATORY LAPAROTOMY;  Surgeon: Robert Bellow, MD;  Location: [facility] ORS;  Service: General;  Laterality: N/A;  . LAPAROTOMY WITH STAGING N/A 12/22/2014   Procedure: LAPAROTOMY WITH STAGING;  Surgeon: Gillis Ends, MD;  Location: [facility] ORS;  Service: Gynecology;  Laterality: N/A;  . LEFT HEART CATH AND CORONARY ANGIOGRAPHY N/A 12/10/2017   Procedure: LEFT HEART CATH AND CORONARY ANGIOGRAPHY;  Surgeon: Yolonda Kida, MD;  Location: [place] CV LAB;  Service: Cardiovascular;  Laterality: N/A;  . PERIPHERAL VASCULAR CATHETERIZATION Left 12/22/2014   Procedure: PORTA CATH INSERTION;  Surgeon: Robert Bellow, MD;  Location: [facility] ORS;  Service: General;  Laterality: Left;  . PORTACATH PLACEMENT Right 12/22/2014   Procedure: INSERTION PORT-A-CATH;  Surgeon: Forest Gleason  Bary Castilla, MD;  Location: [facility] ORS;  Service: General;  Laterality: Right;  . SALPINGOOPHORECTOMY Bilateral 12/22/2014   Procedure: SALPINGO OOPHORECTOMY;  Surgeon: Gillis Ends, MD;  Location: [facility] ORS;  Service: Gynecology;  Laterality: Bilateral;  . UPPER GI ENDOSCOPY  09/25/04   hiatus hernia, schatzki ring and a single gastric polyp    FAMILY HISTORY :   Family History  Problem Relation Age  of Onset  . Cancer Mother        breast, throat, and stomach  . Breast cancer Mother   . Heart disease Father   . Pulmonary embolism Sister   . Cancer Brother 60       angiosarcoma of the chest; carcinoid small intestinal tumor  . Hypertension Brother   . Stroke Brother   . Cancer Maternal Aunt        breast cancer  . Cancer Maternal Grandfather 46       pancreatic    SOCIAL HISTORY:   Social History   Tobacco Use  . Smoking status: Never Smoker  . Smokeless tobacco: Never Used  Substance Use Topics  . Alcohol use: No  . Drug use: No    ALLERGIES:  is allergic to carafate [sucralfate] and sulfa antibiotics.  MEDICATIONS:  Current Outpatient Medications  Medication Sig Dispense Refill  . acetaminophen (TYLENOL) 500 MG tablet Take 500 mg by mouth every 6 (six) hours as needed.    Marland Kitchen alum & mag hydroxide-simeth (MAALOX/MYLANTA) 200-200-20 MG/5ML suspension Take 15 mLs by mouth every 6 (six) hours as needed for indigestion or heartburn. 355 mL 0  . aspirin EC 81 MG tablet Take 81 mg by mouth daily.    . Cholecalciferol (VITAMIN D) 2000 units tablet Take 1 tablet (2,000 Units total) by mouth daily. 30 tablet 12  . Docosanol (ABREVA EX) Apply 1 application topically as needed. Fever blisters    . fluticasone (FLONASE) 50 MCG/ACT nasal spray Place 1 spray into both nostrils daily as needed.     . lactulose (CHRONULAC) 10 GM/15ML solution Take 15 mLs (10 g total) by mouth daily as needed for moderate constipation. 236 mL 0  . levothyroxine (SYNTHROID, LEVOTHROID) 50 MCG tablet TAKE ONE TABLET ON AN EMPTY STOMACH WITHA GLASS OF WATER AT LEAST 30 TO 60 MINUTES BEFORE BREAKFAST 30 tablet 12  . lidocaine-prilocaine (EMLA) cream Apply to port area 30-45 mins prior to chemo. 30 g 5  . loratadine (CLARITIN) 10 MG tablet Take 10 mg by mouth daily as needed.     Marland Kitchen Lysine 500 MG TABS Take 1 tablet by mouth daily as needed.     . metoprolol succinate (TOPROL-XL) 25 MG 24 hr tablet Take 1  tablet by mouth daily.    . Multiple Vitamin (MULTIVITAMIN) capsule Take 1 capsule by mouth daily.    . ondansetron (ZOFRAN) 4 MG tablet One pill 30-60 mins prior to taking zejula to prevent nausea/vomitting. (Patient taking differently: Take 4 mg by mouth every 8 (eight) hours as needed. One pill 30-60 mins prior to taking zejula to prevent nausea/vomitting.) 45 tablet 3  . pantoprazole (PROTONIX) 40 MG tablet Take 1 tablet (40 mg total) by mouth 2 (two) times daily. 60 tablet 1  . polyethylene glycol (MIRALAX / GLYCOLAX) packet Take 17 g by mouth daily as needed.    . prochlorperazine (COMPAZINE) 10 MG tablet Take 1 tablet (10 mg total) by mouth every 6 (six) hours as needed for nausea or vomiting. 30 tablet 4  . Simethicone (GAS-X  PO) Take 1 tablet by mouth 2 (two) times daily as needed.     . furosemide (LASIX) 20 MG tablet Take 0.5 tablets (10 mg total) by mouth daily as needed for edema. 30 tablet 0  . LORazepam (ATIVAN) 0.5 MG tablet Take one tablet by mouth at bedtime as needed for anxiety/sleep 30 tablet 0   No current facility-administered medications for this visit.     PHYSICAL EXAMINATION: ECOG PERFORMANCE STATUS: 1 - Symptomatic but completely ambulatory  BP 138/77 (BP Location: Left Arm, Patient Position: Sitting, Cuff Size: Normal)   Pulse (!) 108   Temp (!) 97.2 F (36.2 C) (Tympanic)   Resp 16   Wt 200 lb 3.2 oz (90.8 kg)   BMI 34.36 kg/m   Filed Weights   09/19/18 1018  Weight: 200 lb 3.2 oz (90.8 kg)    Physical Exam  Constitutional: She is oriented to person, place, and time and well-developed, well-nourished, and in no distress.  Accompanied by husband.  Patient in the wheelchair  HENT:  Head: Normocephalic and atraumatic.  Mouth/Throat: Oropharynx is clear and moist. No oropharyngeal exudate.  Eyes: Pupils are equal, round, and reactive to light.  Neck: Normal range of motion. Neck supple.  Cardiovascular: Normal rate and regular rhythm.  Pulmonary/Chest:  No respiratory distress. She has no wheezes.  Abdominal: Soft. Bowel sounds are normal. She exhibits distension. She exhibits no mass. There is no abdominal tenderness. There is no rebound and no guarding.  Mild abdominal distention noted.  Mild ascites noted.  Musculoskeletal: Normal range of motion.        General: Edema present. No tenderness.  Neurological: She is alert and oriented to person, place, and time.  Skin: Skin is warm.  Psychiatric: Affect normal.    LABORATORY DATA:  I have reviewed the data as listed    Component Value Date/Time   NA 135 09/19/2018 0945   NA 140 12/14/2014 1037   K 4.2 09/19/2018 0945   K 4.4 12/14/2014 1037   CL 103 09/19/2018 0945   CL 106 12/14/2014 1037   CO2 22 09/19/2018 0945   CO2 26 12/14/2014 1037   GLUCOSE 115 (H) 09/19/2018 0945   GLUCOSE 87 12/14/2014 1037   BUN 20 09/19/2018 0945   BUN 12 12/14/2014 1037   CREATININE 1.13 (H) 09/19/2018 0945   CREATININE 0.97 12/14/2014 1037   CALCIUM 8.1 (L) 09/19/2018 0945   CALCIUM 9.2 12/14/2014 1037   PROT 5.8 (L) 09/19/2018 0945   PROT 7.3 12/14/2014 1037   ALBUMIN 2.3 (L) 09/19/2018 0945   ALBUMIN 3.8 12/14/2014 1037   AST 27 09/19/2018 0945   AST 20 12/14/2014 1037   ALT 14 09/19/2018 0945   ALT 11 (L) 12/14/2014 1037   ALKPHOS 85 09/19/2018 0945   ALKPHOS 138 (H) 12/14/2014 1037   BILITOT 0.5 09/19/2018 0945   BILITOT 0.5 12/14/2014 1037   GFRNONAA 48 (L) 09/19/2018 0945   GFRNONAA 59 (L) 12/14/2014 1037   GFRAA 55 (L) 09/19/2018 0945   GFRAA >60 12/14/2014 1037    No results found for: SPEP, UPEP  Lab Results  Component Value Date   WBC 6.2 09/19/2018   NEUTROABS 3.5 09/19/2018   HGB 9.9 (L) 09/19/2018   HCT 33.2 (L) 09/19/2018   MCV 87.4 09/19/2018   PLT 372 09/19/2018      Chemistry      Component Value Date/Time   NA 135 09/19/2018 0945   NA 140 12/14/2014 1037   K 4.2  09/19/2018 0945   K 4.4 12/14/2014 1037   CL 103 09/19/2018 0945   CL 106 12/14/2014  1037   CO2 22 09/19/2018 0945   CO2 26 12/14/2014 1037   BUN 20 09/19/2018 0945   BUN 12 12/14/2014 1037   CREATININE 1.13 (H) 09/19/2018 0945   CREATININE 0.97 12/14/2014 1037   GLU 84 03/16/2014      Component Value Date/Time   CALCIUM 8.1 (L) 09/19/2018 0945   CALCIUM 9.2 12/14/2014 1037   ALKPHOS 85 09/19/2018 0945   ALKPHOS 138 (H) 12/14/2014 1037   AST 27 09/19/2018 0945   AST 20 12/14/2014 1037   ALT 14 09/19/2018 0945   ALT 11 (L) 12/14/2014 1037   BILITOT 0.5 09/19/2018 0945   BILITOT 0.5 12/14/2014 1037       RADIOGRAPHIC STUDIES: I have personally reviewed the radiological images as listed and agreed with the findings in the report. No results found.   ASSESSMENT & PLAN:  Ovarian cancer, unspecified laterality ([place]) # RECURRENT HIGH GRADE SEROUS ovarian cancer/platinum refractory November 18th-CT abdomen pelvis shows progression-peritoneal/serosal; currently on Taxotere.  Status post 3 cycles.  #Concern for progressive disease given rising tumor marker; CEA 125 4000+.  Patient also needing recurrent paracentesis for ascites.  Discussed the possibility of clinical trial at [facility] using monoclonal antibody; however issues with insurance/patient reluctance.  Patient also met with Dr. Elsie Stain feels palliative care/hospice is most reasonable given patient's current situation.  #Discontinue further chemotherapy for above reasons.  Patient also not too keen on further lines of therapy.  #Recurrent ascites-x2 paracentesis; discussed regarding Pleurx catheter placement.  Patient is interested.  We will make a referral to IR.  # Peripheral neuropathy grade 1.  #Bilateral swelling in the legs-secondary to third spacing; no clinical concerns for DVT.  Recommend Lasix 10 mg a day for symptomatic control.  #Discussed the overall poor prognosis/natural history of disease and the life expectancy in general of few months.  Discussed with [facility]/palliative care.   # 40  minutes face-to-face with the patient discussing the above plan of care; more than 50% of time spent on prognosis/ natural history; counseling and coordination.   # DISPOSITION:  # hospice referral # pleurex cath placement- # follow up with MD as needed- Dr.B   Orders Placed This Encounter  Procedures  . Urine culture    Standing Status:   Future    Number of Occurrences:   1    Standing Expiration Date:   09/20/2019  . Urinalysis, Complete w Microscopic    Standing Status:   Future    Number of Occurrences:   1    Standing Expiration Date:   09/20/2019   All questions were answered. The patient knows to call the clinic with any problems, questions or concerns.      Cammie Sickle, MD 09/22/2018 12:33 PM

## 2018-09-20 LAB — URINE CULTURE: Culture: <10000 — AB

## 2018-09-20 LAB — CA 125: Cancer Antigen (CA) 125: 9299 U/mL — ABNORMAL HIGH (ref 0.0–38.1)

## 2018-09-22 ENCOUNTER — Telehealth: Payer: Self-pay | Admitting: Internal Medicine

## 2018-09-22 ENCOUNTER — Other Ambulatory Visit: Payer: Self-pay

## 2018-09-22 DIAGNOSIS — C569 Malignant neoplasm of unspecified ovary: Secondary | ICD-10-CM

## 2018-09-22 NOTE — Telephone Encounter (Signed)
Referral to IR has been placed for pleurx catheter placement. Hospice referral has been faxed.

## 2018-09-22 NOTE — Telephone Encounter (Signed)
Patient has been notified of these results and verbalized understanding. I also advised her of referrals that have been placed, and that someone should be contacting her soon. Patient verbalized understanding and thanked me for calling.

## 2018-09-22 NOTE — Telephone Encounter (Signed)
Heather/Brooke-please inform patient that urine culture negative for infection; would not recommend antibiotics.  Her symptoms are likely from her progressive disease/ascites.  #Please make a referral to IR regarding abdominal Pleurx catheter placement ASAP.  #Hospice referral ASAP.   #No follow-ups with me planned at this time/this was discussed with the patient she agrees.

## 2018-09-24 NOTE — Telephone Encounter (Signed)
Pleurx cath placement has been scheduled for Friday 09/26/18 at 7:00AM. Patient has been notified and verbalized understanding.

## 2018-09-25 ENCOUNTER — Other Ambulatory Visit: Payer: Self-pay | Admitting: Radiology

## 2018-09-26 ENCOUNTER — Other Ambulatory Visit: Payer: Self-pay | Admitting: *Deleted

## 2018-09-26 ENCOUNTER — Ambulatory Visit
Admission: RE | Admit: 2018-09-26 | Discharge: 2018-09-26 | Disposition: A | Discharge status: Home / Self Care | Source: Ambulatory Visit | Attending: Internal Medicine | Admitting: Internal Medicine

## 2018-09-26 ENCOUNTER — Other Ambulatory Visit: Payer: Self-pay | Admitting: Internal Medicine

## 2018-09-26 ENCOUNTER — Telehealth: Payer: Self-pay | Admitting: *Deleted

## 2018-09-26 ENCOUNTER — Other Ambulatory Visit: Payer: Self-pay

## 2018-09-26 DIAGNOSIS — R180 Malignant ascites: Secondary | ICD-10-CM

## 2018-09-26 DIAGNOSIS — Z538 Procedure and treatment not carried out for other reasons: Secondary | ICD-10-CM | POA: Insufficient documentation

## 2018-09-26 DIAGNOSIS — C549 Malignant neoplasm of corpus uteri, unspecified: Secondary | ICD-10-CM | POA: Diagnosis not present

## 2018-09-26 DIAGNOSIS — C569 Malignant neoplasm of unspecified ovary: Secondary | ICD-10-CM

## 2018-09-26 DIAGNOSIS — R188 Other ascites: Secondary | ICD-10-CM | POA: Diagnosis not present

## 2018-09-26 NOTE — OR Nursing (Signed)
Dr Barbie Banner notified that pt reported fever chills yesterday and into evening. Temp this am 98.2. pt took personal zofran on arrival for dry heave nausea. Procedure cancelled but will keep her here until able to reach Dr Rogue Bussing when office opens for pt order for paracentesis. Pt reports feeling full and uncomfortable she reports she had 2 lb weight gain since last Friday.

## 2018-09-26 NOTE — Progress Notes (Signed)
Patient ID: Jamie Conner, female   DOB: 09/18/1940, 78 y.o.   MRN: [id number]  The patient is referred to our service for abdominal/peritoneal Pleurx placement. She had a low grade fever yesterday with chills. She also describes sharp upper abdominal pain. She denies cough, SOB, dysuria. Currently She is afebrile. Because of the concern for possible seeding of the Pleurx catheter, placement will be deferred. I will discuss this with Dr. Burlene Arnt. Paracentesis can be offered today if desired.

## 2018-09-26 NOTE — Telephone Encounter (Signed)
Orders entered and worksheet sent to IR spec. scheduler

## 2018-09-26 NOTE — Telephone Encounter (Signed)
Hospice reports that new order for Pleurex cath to be inserted needs to be sent to later next week insertion

## 2018-09-26 NOTE — OR Nursing (Signed)
Pt transferred to [place] for paracentesis then discharge home

## 2018-09-29 ENCOUNTER — Telehealth: Payer: Self-pay | Admitting: *Deleted

## 2018-09-29 NOTE — Telephone Encounter (Signed)
Jamie Conner Orders were faxed on Friday to r/s the para. I will f/u to see when this can be scheduled. I will ask Dr. B about the fevers.

## 2018-09-29 NOTE — Telephone Encounter (Signed)
Patient contacted with pleurx cath apts.

## 2018-09-29 NOTE — Telephone Encounter (Signed)
Discussed with Judson Roch, patient only having evening fevers and thinks it may be tumor fever so no [facility] appointment wanted at this time. She is already taking Tylenol for pain which is not effective and she is having back pain and asking for something stronger(Tramadol is also not effective) Please advise

## 2018-09-29 NOTE — Telephone Encounter (Signed)
Per Dr. Jacinto Reap- patient may try Tylenol to get the fever under control. Patient may also be offered an apt in [facility] clinic

## 2018-09-29 NOTE — Telephone Encounter (Signed)
ospice asking that we schedule patient for her Pleurex placement ASAP as she is filing up again quickly. Also reports that patient is still runnig fever over weekend and wants to know if you want to do any testing for source.Please advise

## 2018-09-30 ENCOUNTER — Other Ambulatory Visit: Payer: Self-pay | Admitting: Internal Medicine

## 2018-09-30 MED ORDER — OXYCODONE HCL 5 MG PO TABS: 5.0000 mg | ORAL_TABLET | ORAL | 0 refills | Status: AC | PRN

## 2018-09-30 NOTE — Progress Notes (Signed)
Called script for oxycodone.

## 2018-09-30 NOTE — Telephone Encounter (Signed)
Per dr. b oxycodone script to be sent to pt's preferred pharmacy

## 2018-10-02 ENCOUNTER — Other Ambulatory Visit: Payer: Self-pay | Admitting: Radiology

## 2018-10-03 ENCOUNTER — Telehealth: Payer: Self-pay | Admitting: *Deleted

## 2018-10-03 ENCOUNTER — Other Ambulatory Visit: Payer: Self-pay

## 2018-10-03 ENCOUNTER — Ambulatory Visit
Admission: RE | Admit: 2018-10-03 | Discharge: 2018-10-03 | Disposition: A | Discharge status: Home / Self Care | Source: Ambulatory Visit | Attending: Internal Medicine | Admitting: Internal Medicine

## 2018-10-03 ENCOUNTER — Ambulatory Visit (HOSPITAL_COMMUNITY): Payer: PPO

## 2018-10-03 DIAGNOSIS — C569 Malignant neoplasm of unspecified ovary: Secondary | ICD-10-CM | POA: Diagnosis not present

## 2018-10-03 DIAGNOSIS — Z79899 Other long term (current) drug therapy: Secondary | ICD-10-CM | POA: Insufficient documentation

## 2018-10-03 DIAGNOSIS — I4891 Unspecified atrial fibrillation: Secondary | ICD-10-CM | POA: Diagnosis not present

## 2018-10-03 DIAGNOSIS — R188 Other ascites: Secondary | ICD-10-CM | POA: Diagnosis not present

## 2018-10-03 DIAGNOSIS — K219 Gastro-esophageal reflux disease without esophagitis: Secondary | ICD-10-CM | POA: Diagnosis not present

## 2018-10-03 DIAGNOSIS — Z853 Personal history of malignant neoplasm of breast: Secondary | ICD-10-CM | POA: Insufficient documentation

## 2018-10-03 DIAGNOSIS — R180 Malignant ascites: Secondary | ICD-10-CM | POA: Diagnosis not present

## 2018-10-03 DIAGNOSIS — Z9221 Personal history of antineoplastic chemotherapy: Secondary | ICD-10-CM | POA: Insufficient documentation

## 2018-10-03 DIAGNOSIS — Z923 Personal history of irradiation: Secondary | ICD-10-CM | POA: Insufficient documentation

## 2018-10-03 DIAGNOSIS — E785 Hyperlipidemia, unspecified: Secondary | ICD-10-CM | POA: Insufficient documentation

## 2018-10-03 HISTORY — PX: IR GUIDED DRAIN W CATHETER PLACEMENT: IMG719

## 2018-10-03 MED ORDER — LIDOCAINE HCL (PF) 1 % IJ SOLN
INTRAMUSCULAR | Status: AC
  Filled 2018-10-03: qty 30

## 2018-10-03 MED ORDER — MIDAZOLAM HCL 5 MG/5ML IJ SOLN
INTRAMUSCULAR | Status: AC
  Filled 2018-10-03: qty 5

## 2018-10-03 MED ORDER — CEFAZOLIN SODIUM-DEXTROSE 2-4 GM/100ML-% IV SOLN
2.0000 g | INTRAVENOUS | Status: AC
  Administered 2018-10-03: 2 g via INTRAVENOUS

## 2018-10-03 MED ORDER — LIDOCAINE HCL (PF) 1 % IJ SOLN
INTRAMUSCULAR | Status: AC | PRN
  Administered 2018-10-03: 20 mL

## 2018-10-03 MED ORDER — CEFAZOLIN SODIUM-DEXTROSE 2-4 GM/100ML-% IV SOLN
INTRAVENOUS | Status: AC
  Filled 2018-10-03: qty 100

## 2018-10-03 MED ORDER — MIDAZOLAM HCL 5 MG/5ML IJ SOLN
INTRAMUSCULAR | Status: AC | PRN
  Administered 2018-10-03: 0.5 mg via INTRAVENOUS
  Administered 2018-10-03: 1 mg via INTRAVENOUS

## 2018-10-03 MED ORDER — IOPAMIDOL (ISOVUE-300) INJECTION 61%: 100.0000 mL | Freq: Once | INTRAVENOUS | Status: DC | PRN

## 2018-10-03 MED ORDER — FENTANYL CITRATE (PF) 100 MCG/2ML IJ SOLN
INTRAMUSCULAR | Status: AC | PRN
  Administered 2018-10-03: 50 ug via INTRAVENOUS
  Administered 2018-10-03: 25 ug via INTRAVENOUS

## 2018-10-03 MED ORDER — ACETAMINOPHEN 325 MG PO TABS
650.0000 mg | ORAL_TABLET | Freq: Once | ORAL | Status: AC
  Administered 2018-10-03: 650 mg via ORAL
  Filled 2018-10-03: qty 2

## 2018-10-03 MED ORDER — ACETAMINOPHEN 325 MG PO TABS
ORAL_TABLET | ORAL | Status: AC
  Administered 2018-10-03: 650 mg via ORAL
  Filled 2018-10-03: qty 2

## 2018-10-03 MED ORDER — FENTANYL CITRATE (PF) 100 MCG/2ML IJ SOLN
INTRAMUSCULAR | Status: AC
  Filled 2018-10-03: qty 4

## 2018-10-03 MED ORDER — SODIUM CHLORIDE 0.9 % IV SOLN
INTRAVENOUS | Status: DC
  Administered 2018-10-03: 12:00:00 via INTRAVENOUS

## 2018-10-03 NOTE — Telephone Encounter (Signed)
Patient had cath placed today and she needs order for how frequent and how often to drawn fluid off abdomen. Please call orders to her. If after 5 call [phone]

## 2018-10-03 NOTE — Progress Notes (Signed)
Chief Complaint: Patient was seen in consultation today for recurrent ascites  Referring Physician(s): Cammie Sickle  Supervising Physician: Markus Daft  Patient Status: [facility] - Out-pt  History of Present Illness: Jamie Conner is a 78 y.o. female with past medical history of a fib, GERD, HLD, and ovarian cancer with recurrent ascites requiring frequent paracentesis. Last paracentesis was 2/7 with 2.4 liters removed.  IR now consulted for PleurX catheter placement at the request of Dr. Rogue Bussing.    Patient presents for procedure today.  She has been NPO.  She does not take blood thinners.   Past Medical History:  Diagnosis Date  . Atrial fibrillation ([place])   . Breast cancer ([place]) 1998  . Cancer of breast ([place]) 1998   Left/radiation  . CINV (chemotherapy-induced nausea and vomiting)   . GERD (gastroesophageal reflux disease)   . Hyperlipidemia   . Hypothyroidism   . Mitral insufficiency   . Ovarian cancer ([place])    s/p BSO optimal tumor debulking May 2016/chemo  . Personal history of chemotherapy since 2016   ovarian cancer  . Personal history of radiation therapy 1998  . PVC (premature ventricular contraction)     Past Surgical History:  Procedure Laterality Date  . ABDOMINAL HYSTERECTOMY    . BILATERAL SALPINGOOPHORECTOMY  May 2016   with optimal tumor debulking   . BREAST LUMPECTOMY Left 1998  . BREAST SURGERY     Left  . CESAREAN SECTION     x2  . CHOLECYSTECTOMY    . COLONOSCOPY  2006  . DEBULKING N/A 12/22/2014   Procedure: DEBULKING;  Surgeon: Gillis Ends, MD;  Location: [facility] ORS;  Service: Gynecology;  Laterality: N/A;  . LAPAROTOMY N/A 12/22/2014   Procedure: EXPLORATORY LAPAROTOMY;  Surgeon: Robert Bellow, MD;  Location: [facility] ORS;  Service: General;  Laterality: N/A;  . LAPAROTOMY WITH STAGING N/A 12/22/2014   Procedure: LAPAROTOMY WITH STAGING;  Surgeon: Gillis Ends, MD;  Location: [facility] ORS;  Service: Gynecology;   Laterality: N/A;  . LEFT HEART CATH AND CORONARY ANGIOGRAPHY N/A 12/10/2017   Procedure: LEFT HEART CATH AND CORONARY ANGIOGRAPHY;  Surgeon: Yolonda Kida, MD;  Location: [facility];  Service: Cardiovascular;  Laterality: N/A;  . PERIPHERAL VASCULAR CATHETERIZATION Left 12/22/2014   Procedure: PORTA CATH INSERTION;  Surgeon: Robert Bellow, MD;  Location: [facility] OR[facility];  Service: General;  Laterality: Left;  . PORTACATH PLACEMENT Right 12/22/2014   Procedure: INSERTION PORT-A-CATH;  Surgeon: Robert Bellow, MD;  Location: [facility] OR[facility];  Service: General;  Laterality: Right;  . SALPINGOOPHORECTOMY Bilateral 12/22/2014   Procedure: SALPINGO OOPHORECTOMY;  Surgeon: Gillis Ends, MD;  Location: [facility];  Service: Gynecology;  Laterality: Bilateral;  . UPPER GI ENDOSCOPY  09/25/04   hiatus hernia, schatzki ring and a single gastric polyp    Allergies: Carafate [sucralfate] and Sulfa antibiotics  Medications: Prior to Admission medications   Medication Sig Start Date End Date Taking? Authorizing Provider  acetaminophen (TYLENOL) 500 MG tablet Take 500 mg by mouth every 6 (six) hours as needed.   Yes [provider]  alum & mag hydroxide-simeth (MAALOX/MYLANTA) 200-200-20 MG/5ML suspension Take 15 mLs by mouth every 6 (six) hours as needed for indigestion or heartburn. 12/12/17  Yes Gouru, Illene Silver, MD  aspirin EC 81 MG tablet Take 81 mg by mouth daily.   Yes [provider]  Cholecalciferol (VITAMIN D) 2000 units tablet Take 1 tablet (2,000 Units total) by mouth daily. 07/09/17  Yes Rosanna Randy,  Retia Passe., MD  fluticasone [facility]) 50 MCG/ACT nasal spray Place 1 spray into both nostrils daily as needed.    Yes [provider]  furosemide (LASIX) 20 MG tablet Take 0.5 tablets (10 mg total) by mouth daily as needed for edema. 09/19/18  Yes Borders, Kirt Boys, NP  lactulose (CHRONULAC) 10 GM/15ML solution Take 15 mLs (10 g total) by mouth daily as needed for  moderate constipation. 03/25/18  Yes Verlon Au, NP  levothyroxine (SYNTHROID, LEVOTHROID) 50 MCG tablet TAKE ONE TABLET ON AN EMPTY STOMACH WITHA GLASS OF WATER AT LEAST 30 TO 60 MINUTES BEFORE BREAKFAST 02/25/18  Yes Jerrol Banana., MD  loratadine (CLARITIN) 10 MG tablet Take 10 mg by mouth daily as needed.    Yes [provider]  LORazepam (ATIVAN) 0.5 MG tablet Take one tablet by mouth at bedtime as needed for anxiety/sleep 09/19/18  Yes Borders, Kirt Boys, NP  metoprolol succinate (TOPROL-XL) 25 MG 24 hr tablet Take 1 tablet by mouth daily. 12/24/17 12/24/18 Yes [provider]  ondansetron (ZOFRAN) 4 MG tablet One pill 30-60 mins prior to taking zejula to prevent nausea/vomitting. Patient taking differently: Take 4 mg by mouth every 8 (eight) hours as needed. One pill 30-60 mins prior to taking zejula to prevent nausea/vomitting. 11/27/17  Yes Cammie Sickle, MD  pantoprazole (PROTONIX) 40 MG tablet Take 1 tablet (40 mg total) by mouth 2 (two) times daily. 09/12/18  Yes Borders, Kirt Boys, NP  Docosanol (ABREVA EX) Apply 1 application topically as needed. Fever blisters    [provider]  lidocaine-prilocaine (EMLA) cream Apply to port area 30-45 mins prior to chemo. 06/24/17   Cammie Sickle, MD  Lysine 500 MG TABS Take 1 tablet by mouth daily as needed.     [provider]  Multiple Vitamin (MULTIVITAMIN) capsule Take 1 capsule by mouth daily.    [provider]  oxyCODONE (OXY IR/ROXICODONE) 5 MG immediate release tablet Take 1 tablet (5 mg total) by mouth every 4 (four) hours as needed for severe pain. 09/30/18   Cammie Sickle, MD  polyethylene glycol (MIRALAX / GLYCOLAX) packet Take 17 g by mouth daily as needed.    [provider]  prochlorperazine (COMPAZINE) 10 MG tablet Take 1 tablet (10 mg total) by mouth every 6 (six) hours as needed for nausea or vomiting. 04/02/18   Cammie Sickle, MD  Simethicone  (GAS-X PO) Take 1 tablet by mouth 2 (two) times daily as needed.     [provider]     Family History  Problem Relation Age of Onset  . Cancer Mother        breast, throat, and stomach  . Breast cancer Mother   . Heart disease Father   . Pulmonary embolism Sister   . Cancer Brother 60       angiosarcoma of the chest; carcinoid small intestinal tumor  . Hypertension Brother   . Stroke Brother   . Cancer Maternal Aunt        breast cancer  . Cancer Maternal Grandfather 89       pancreatic    Social History   Socioeconomic History  . Marital status: Married    Spouse name: Not on file  . Number of children: 2  . Years of education: Not on file  . Highest education level: Bachelor's degree (e.g., BA, AB, BS)  Occupational History  . 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 Food insecurity:    Worry: Never true    Inability: Never true  . Transportation needs:    Medical: No    Non-medical: No  Tobacco Use  . Smoking status: Never Smoker  . Smokeless tobacco: Never Used  Substance and Sexual Activity  . Alcohol use: No  . Drug use: No  . Sexual activity: Not Currently  Lifestyle  . Physical activity:    Days per week: Not on file    Minutes per session: Not on file  . Stress: To some extent  Relationships  . Social connections:    Talks on phone: Not on file    Gets together: Not on file    Attends religious service: Not on file    Active member of club or organization: Not on file    Attends meetings of clubs or organizations: Not on file    Relationship status: Not on file  Other Topics Concern  . Not on file  Social History Narrative  . Not on file     Review of Systems: A 12 point ROS discussed and pertinent positives are indicated in the HPI above.  All other systems are negative.  Review of Systems  Constitutional: Negative for fatigue and fever.  Respiratory: Negative for cough and shortness of  breath.   Cardiovascular: Negative for chest pain.  Gastrointestinal: Positive for abdominal distention. Negative for abdominal pain, nausea and vomiting.  Psychiatric/Behavioral: Negative for behavioral problems and confusion.    Vital Signs: BP 119/74   Pulse 97   Temp 97.8 F (36.6 C) (Oral)   Resp 18   SpO2 96%   Physical Exam Vitals signs and nursing note reviewed.  Constitutional:      Appearance: Normal appearance.  Cardiovascular:     Rate and Rhythm: Normal rate and regular rhythm.     Heart sounds: No murmur. No friction rub. No gallop.   Pulmonary:     Effort: Pulmonary effort is normal. No respiratory distress.     Breath sounds: Normal breath sounds.  Abdominal:     General: There is distension (mild).     Palpations: Abdomen is soft.     Tenderness: There is no abdominal tenderness.  Skin:    General: Skin is warm and dry.  Neurological:     General: No focal deficit present.     Mental Status: She is alert and oriented to person, place, and time.  Psychiatric:        Mood and Affect: Mood normal.        Behavior: Behavior normal.        Thought Content: Thought content normal.        Judgment: Judgment normal.      MD Evaluation Airway: WNL Heart: WNL Abdomen: WNL Chest/ Lungs: WNL ASA  Classification: 3 Mallampati/Airway Score: One   Imaging: US Paracentesis  Result Date: 09/26/2018 INDICATION: Ascites EXAM: ULTRASOUND GUIDED  PARACENTESIS MEDICATIONS: None. COMPLICATIONS: None immediate. PROCEDURE: Informed written consent was obtained from the patient after a discussion of the risks, benefits and alternatives to treatment. A timeout was performed prior to the initiation of the procedure. Initial ultrasound scanning demonstrates a small amount of ascites within the right lower abdominal quadrant. The right lower abdomen was prepped and draped in the usual sterile fashion. 1% lidocaine with epinephrine was used for local anesthesia. Following this,  a 6 Fr Safe-T-Centesis catheter was introduced. An ultrasound image was saved for documentation purposes.  The paracentesis was performed. The catheter was removed and a dressing was applied. The patient tolerated the procedure well without immediate post procedural complication. FINDINGS: A total of approximately 2.4 L of clear amber fluid was removed. IMPRESSION: Successful ultrasound-guided paracentesis yielding 2.4 liters of peritoneal fluid. Electronically Signed   By: Marybelle Killings M.D.   On: 09/26/2018 10:15   US Paracentesis  Result Date: 09/19/2018 INDICATION: Patient with history of ovarian cancer, recurrent ascites. Request made for therapeutic paracentesis. EXAM: ULTRASOUND GUIDED THERAPEUTIC PARACENTESIS MEDICATIONS: None COMPLICATIONS: None immediate. PROCEDURE: Informed written consent was obtained from the patient after a discussion of the risks, benefits and alternatives to treatment. A timeout was performed prior to the initiation of the procedure. Initial ultrasound scanning demonstrates a small to moderate amount of ascites within the right mid to lower abdominal quadrant. The right mid to lower abdomen was prepped and draped in the usual sterile fashion. 1% lidocaine was used for local anesthesia. Following this, a 6 Fr Safe-T-Centesis catheter was introduced. An ultrasound image was saved for documentation purposes. The paracentesis was performed. The catheter was removed and a dressing was applied. The patient tolerated the procedure well without immediate post procedural complication. FINDINGS: A total of approximately 1.7 liters of blood-tinged fluid was removed. IMPRESSION: Successful ultrasound-guided therapeutic paracentesis yielding 1.7 liters of peritoneal fluid. Read by: Rowe Robert, PA-C Electronically Signed   By: Jerilynn Mages.  Shick M.D.   On: 09/19/2018 14:09   US Paracentesis  Result Date: 09/12/2018 INDICATION: History of ovarian cancer now with symptomatic presumably malignant  ascites. Please perform ultrasound-guided paracentesis for therapeutic purposes. EXAM: ULTRASOUND-GUIDED PARACENTESIS COMPARISON:  CT abdomen and pelvis-07/08/2018 MEDICATIONS: None. COMPLICATIONS: None immediate. TECHNIQUE: Informed written consent was obtained from the patient after a discussion of the risks, benefits and alternatives to treatment. A timeout was performed prior to the initiation of the procedure. Initial ultrasound scanning demonstrates a small to moderate amount of ascites within the right lower abdominal quadrant. The right lower abdomen was prepped and draped in the usual sterile fashion. 1% lidocaine with epinephrine was used for local anesthesia. Under direct ultrasound guidance, a 19 gauge, 7-cm, Yueh catheter was introduced. An ultrasound image was saved for documentation purposed. The paracentesis was performed. The catheter was removed and a dressing was applied. The patient tolerated the procedure well without immediate post procedural complication. FINDINGS: A total of approximately 1.6 liters of slightly bloody ascitic fluid was removed. IMPRESSION: Successful ultrasound-guided paracentesis yielding 1.6 liters of peritoneal fluid. Electronically Signed   By: Sandi Mariscal M.D.   On: 09/12/2018 13:15    Labs:  CBC: Recent Labs    08/22/18 0942 08/29/18 0932 09/05/18 1007 09/19/18 0945  WBC 4.5 5.0 4.2 6.2  HGB 9.7* 9.2* 9.5* 9.9*  HCT 32.2* 30.8* 31.3* 33.2*  PLT 267 277 269 372    COAGS: Recent Labs    12/10/17 1040 07/06/18 0906  INR 0.87 1.13  APTT 28  --     BMP: Recent Labs    08/22/18 0942 08/29/18 0932 09/05/18 1007 09/19/18 0945  NA 141 140 139 135  K 3.6 4.1 4.1 4.2  CL 108 107 106 103  CO2 25 26 26 22   GLUCOSE 117* 107* 107* 115*  BUN 14 15 15 20   CALCIUM 9.0 8.8* 9.0 8.1*  CREATININE 0.97 1.13* 1.01* 1.13*  GFRNONAA 57* 48* 54* 48*  GFRAA >60 55* >60 55*    LIVER FUNCTION TESTS: Recent Labs    08/22/18 0942 08/29/18 0932  09/05/18 1007 09/19/18 0945  BILITOT 0.5 0.4 0.5 0.5  AST 26 22 26 27   ALT 13 13 13 14   ALKPHOS 77 71 69 85  PROT 6.6 6.3* 6.4* 5.8*  ALBUMIN 3.3* 3.1* 3.1* 2.3*    TUMOR MARKERS: No results for input(s): AFPTM, CEA, CA199, CHROMGRNA in the last 8760 hours.  Assessment and Plan: Patient with past medical history of ovarian cancer presents with complaint of recurrent ascites.  IR consulted for PleurX catheter placement at the request of Dr. Rogue Bussing. Case reviewed by Dr. Anselm Pancoast who approves patient for procedure.  Patient presents today in their usual state of health.  She has been NPO and is not currently on blood thinners.  Discussed recent stability in lab work with Dr. Anselm Pancoast. Not on blood thinners.  No new labs obtained today.   Risks and benefits discussed with the patient including, but not limited to bleeding, infection, or injury.   All of the patient's questions were answered, patient is agreeable to proceed. Consent signed and in chart.  Thank you for this interesting consult.  I greatly enjoyed meeting Jamie Conner and look forward to participating in their care.  A copy of this report was sent to the requesting provider on this date.  Electronically Signed: Docia Barrier, PA 10/03/2018, 12:32 PM   I spent a total of  30 Minutes   in face to face in clinical consultation, greater than 50% of which was counseling/coordinating care for ovarian cancer, recurrent abdominal ascites.

## 2018-10-03 NOTE — Procedures (Signed)
Interventional Radiology Procedure:   Indications: Ovarian cancer and recurrent ascites  Procedure: Peritoneal tunneled catheter placement  Findings: Tube placed in RLQ, working well.    Complications: None     EBL: Minimal  Plan: Discharge to home and use drain as needed with Hospice assistance.     Riku Buttery R. Anselm Pancoast, MD  Pager: [phone]

## 2018-10-06 ENCOUNTER — Telehealth: Payer: Self-pay | Admitting: *Deleted

## 2018-10-06 NOTE — Telephone Encounter (Signed)
Jamie Conner informed of orders and repeated back to me and states she is going to put a steri strip on the open area

## 2018-10-06 NOTE — Telephone Encounter (Signed)
Per drainage protocol - Patient may drain NO MORE THAN 2 LITERS from abdomen every 24 hours as needed for ascites.

## 2018-10-06 NOTE — Progress Notes (Signed)
Received message from on call Hospice Rn regarding some leakage around Pleurx cath placement site on 10/05/2018. Consulted with Dr Gwenevere Abbot turn spoke with Hospice RN Jan at this time, to continue monitoring site for increasing leakage, however to redress area and monitor at this time.

## 2018-10-06 NOTE — Telephone Encounter (Signed)
Patient had drain inserted for paracentesis and Hospice needs orders for how frequent and how much to draw fluid off. Please advise

## 2018-10-10 ENCOUNTER — Telehealth: Payer: Self-pay | Admitting: *Deleted

## 2018-10-10 NOTE — Telephone Encounter (Signed)
Patient continues to leak form pleur ex insertion sites and around the pleur ex itself. Asking if she needs to e seen and also stated she is going to call Juanita in IR. Please advise

## 2018-10-10 NOTE — Telephone Encounter (Signed)
Per Vicky in specials, discussed with IR physician and will watch this over the weekend and change the dressing. No need for Dr B to see patient. They are to call IR Monday if no better

## 2018-10-10 NOTE — Telephone Encounter (Signed)
I have a call in to [facility] to see what they recommend

## 2018-10-15 ENCOUNTER — Ambulatory Visit: Payer: PPO

## 2018-10-15 ENCOUNTER — Telehealth: Payer: Self-pay | Admitting: *Deleted

## 2018-10-15 DIAGNOSIS — R059 Cough, unspecified: Secondary | ICD-10-CM

## 2018-10-15 DIAGNOSIS — R0602 Shortness of breath: Secondary | ICD-10-CM

## 2018-10-15 DIAGNOSIS — C569 Malignant neoplasm of unspecified ovary: Secondary | ICD-10-CM

## 2018-10-15 DIAGNOSIS — R05 Cough: Secondary | ICD-10-CM

## 2018-10-15 NOTE — Telephone Encounter (Signed)
Jamie Conner, Dr. Rogue Bussing would like pt to have a chest xray. Orders have been entered. Please communicate plan of care to hopsice. Thanks.

## 2018-10-15 NOTE — Telephone Encounter (Signed)
Jamie Conner, patient can get a chest-xray when she is able.

## 2018-10-15 NOTE — Telephone Encounter (Signed)
Call returned to Judson Roch and advised that patient needs to go to get CXR. She asked if it can wait until Friday because the son is coming to assist her get out of the house

## 2018-10-15 NOTE — Telephone Encounter (Signed)
Sarah with hospice called reporting that patient is having minimal drainage from her Pleurex cath and is having increased pressure and discomfort under her R breast  and shortness of breath and coughing with any activity. Asking for Xray to be ordered, Please advise

## 2018-10-16 ENCOUNTER — Telehealth: Payer: Self-pay | Admitting: Nurse Practitioner

## 2018-10-22 ENCOUNTER — Telehealth: Payer: Self-pay | Admitting: *Deleted

## 2018-10-22 MED ORDER — CEPHALEXIN 500 MG PO CAPS: 500.0000 mg | ORAL_CAPSULE | Freq: Three times a day (TID) | ORAL | 0 refills | Status: AC

## 2018-10-22 NOTE — Telephone Encounter (Signed)
Patient has foul smelling drainage and pain from her Pleur ex insertion site. The site is not red. She is asking if we could order antibiotics for patient. Please advise

## 2018-10-22 NOTE — Telephone Encounter (Signed)
Jamie Conner- please send kelfex 500 mg TID x7 days- Thx

## 2018-10-22 NOTE — Telephone Encounter (Signed)
Jamie Conner informed of prescription sent

## 2018-10-23 ENCOUNTER — Telehealth: Payer: Self-pay | Admitting: *Deleted

## 2018-10-23 NOTE — Telephone Encounter (Signed)
Judson Roch called reporting that she drained the pleurex cath today and thinks that patient has an abcess becuse what drained was a thick tan foul smelling drainage. She is going to recheck patient tomorrow and if no better because patient just started antibiotics yesterday, she will call to see f doctor wants to change to IV antibiotics. Which can be done in the home. She states that the patient is too weak to leave her home so that would be the best option for her if doctor in agreement. She just wanted to give Dr B a heads up for possible call tomorrow

## 2018-11-04 NOTE — Telephone Encounter (Signed)
Note entered in error

## 2018-11-19 DEATH — deceased

## 2019-01-15 ENCOUNTER — Encounter: Payer: Self-pay | Admitting: Internal Medicine

## 2019-02-11 ENCOUNTER — Ambulatory Visit: Payer: PPO

## 2019-02-11 ENCOUNTER — Encounter: Payer: PPO | Admitting: Family Medicine
